# Patient Record
Sex: Female | Born: 1935 | Race: White | Hispanic: No | Smoking: Never smoker
Health system: Southern US, Community
[De-identification: ages and names within clinical notes are randomized; demographics above are authoritative.]

## PROBLEM LIST (undated history)

## (undated) DIAGNOSIS — J449 Chronic obstructive pulmonary disease, unspecified: Secondary | ICD-10-CM

## (undated) DIAGNOSIS — E7800 Pure hypercholesterolemia, unspecified: Secondary | ICD-10-CM

## (undated) DIAGNOSIS — I509 Heart failure, unspecified: Secondary | ICD-10-CM

## (undated) DIAGNOSIS — I10 Essential (primary) hypertension: Secondary | ICD-10-CM

## (undated) DIAGNOSIS — I2510 Atherosclerotic heart disease of native coronary artery without angina pectoris: Secondary | ICD-10-CM

## (undated) DIAGNOSIS — M1990 Unspecified osteoarthritis, unspecified site: Secondary | ICD-10-CM

---

## 1986-09-23 HISTORY — PX: DILATION AND CURETTAGE OF UTERUS: SHX78

## 1990-09-23 HISTORY — PX: SIGMOIDOSCOPY: SUR1295

## 1999-09-24 HISTORY — PX: SHOULDER SURGERY: SHX246

## 2004-09-23 HISTORY — PX: AORTIC VALVE REPLACEMENT: SHX41

## 2004-10-25 ENCOUNTER — Ambulatory Visit: Payer: Self-pay | Admitting: Cardiology

## 2004-11-09 DIAGNOSIS — Z952 Presence of prosthetic heart valve: Secondary | ICD-10-CM | POA: Insufficient documentation

## 2005-09-06 ENCOUNTER — Ambulatory Visit: Payer: Self-pay | Admitting: Family Medicine

## 2005-10-01 ENCOUNTER — Ambulatory Visit: Payer: Self-pay | Admitting: Ophthalmology

## 2005-10-14 ENCOUNTER — Ambulatory Visit: Payer: Self-pay | Admitting: Ophthalmology

## 2006-01-28 ENCOUNTER — Ambulatory Visit: Payer: Self-pay | Admitting: Cardiology

## 2006-01-28 IMAGING — US US CAROTID DUPLEX BILAT
1 series · 17 of 24 positions shown · non-contrast
Comparison: none

REASON FOR EXAM: TIAs
COMMENTS:

[Series 1: us carotid duplex bilat · 17 of 59 slices shown]
[im 1/59]
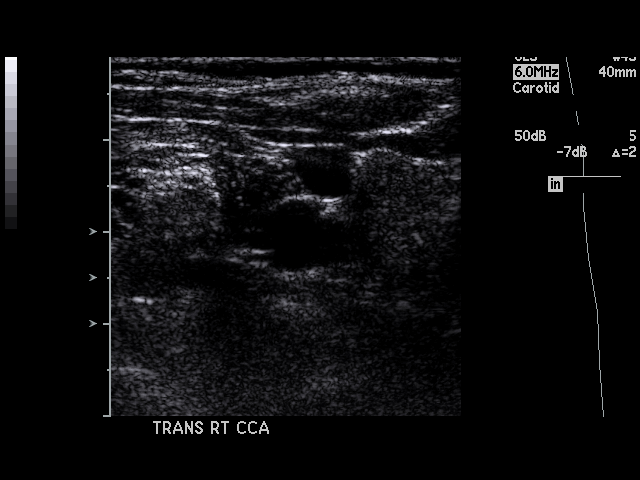
[im 6/59]
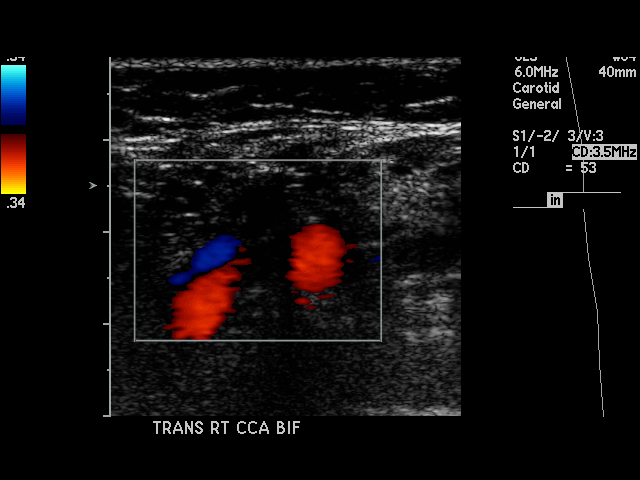
[im 8/59]
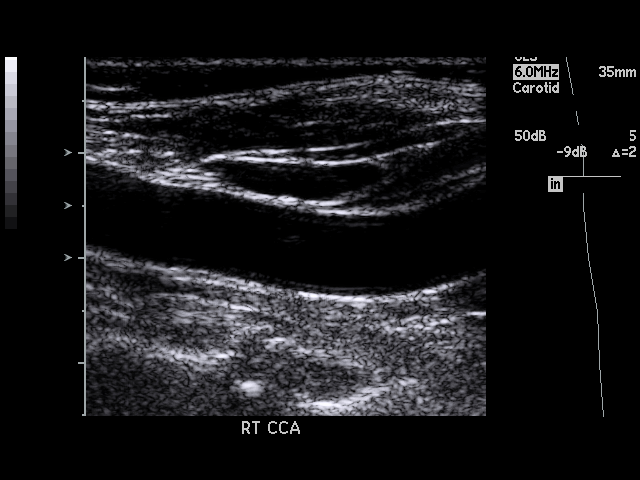
[im 11/59]
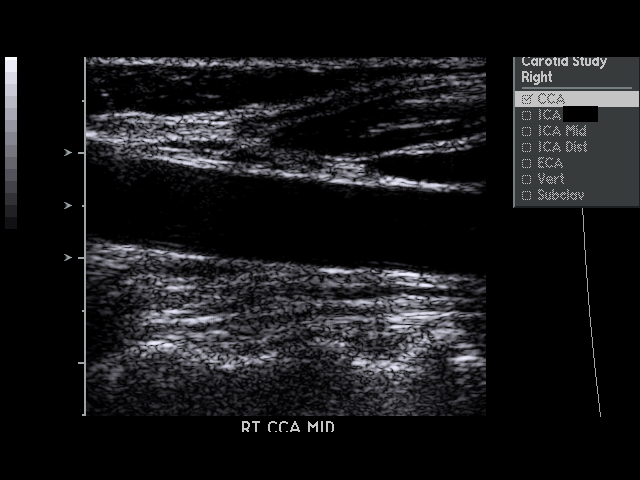
[im 16/59]
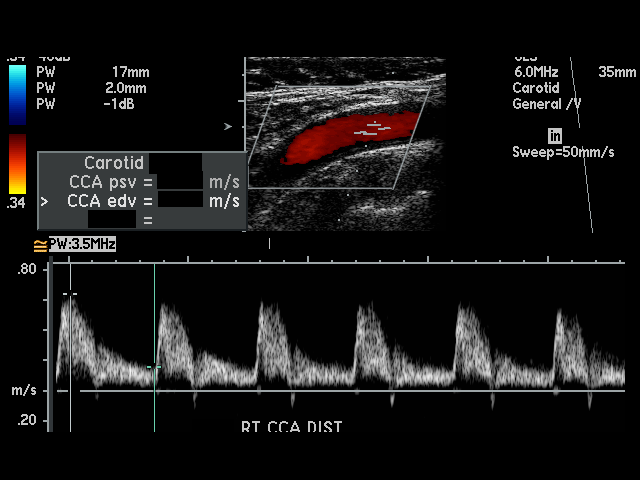
[im 18/59]
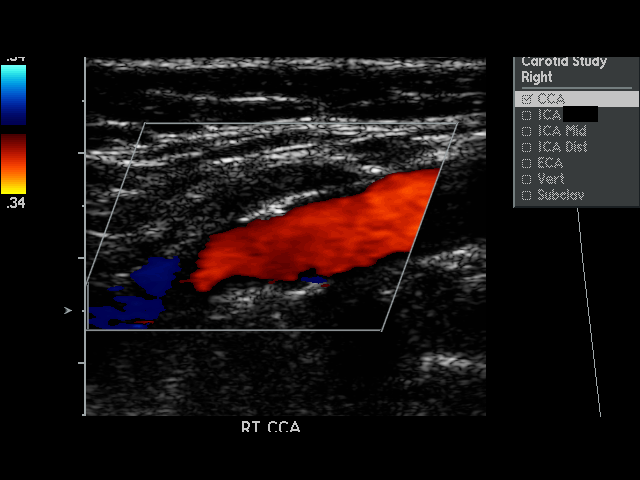
[im 23/59]
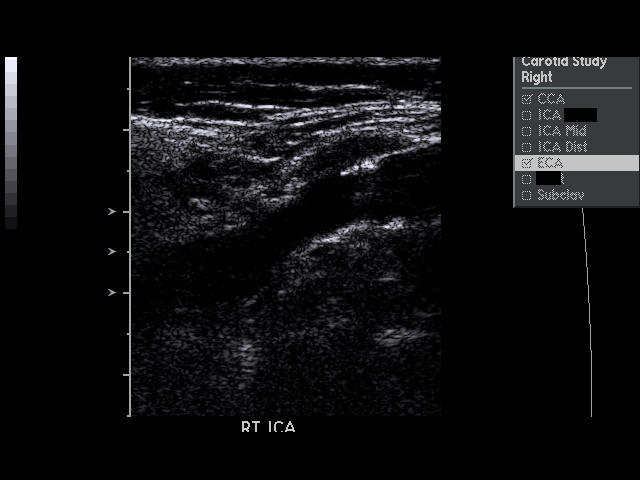
[im 26/59]
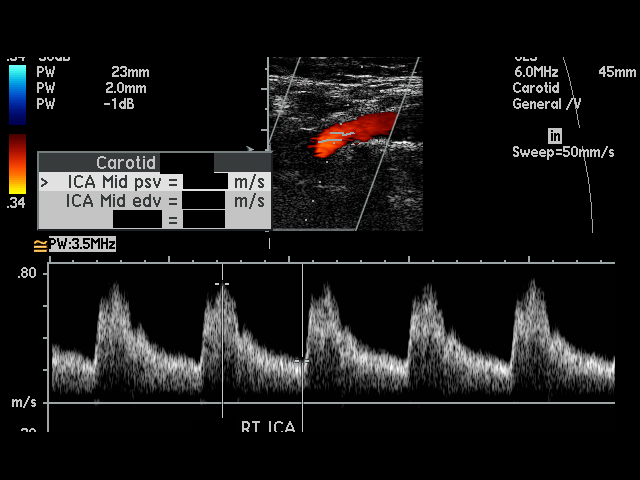
[im 31/59]
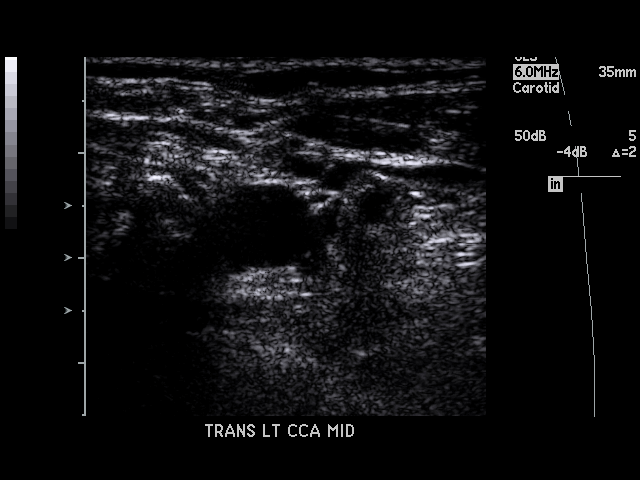
[im 33/59]
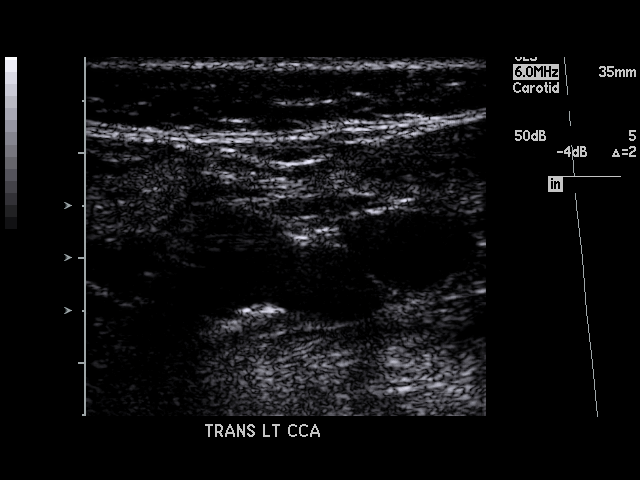
[im 36/59]
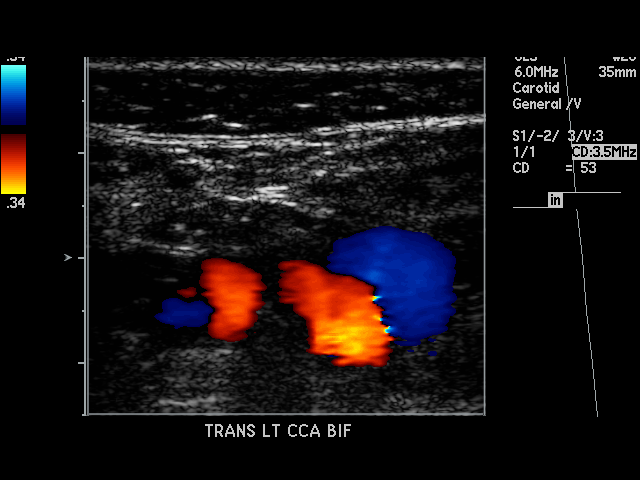
[im 41/59]
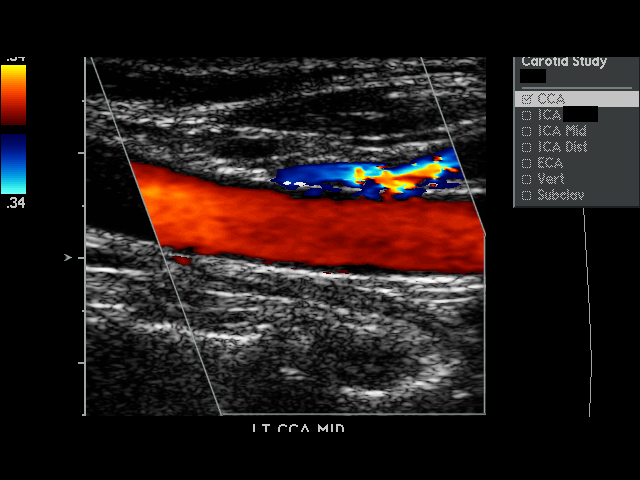
[im 43/59]
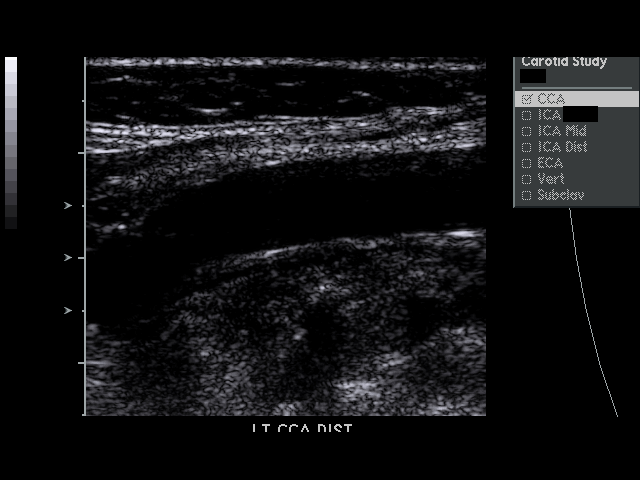
[im 48/59]
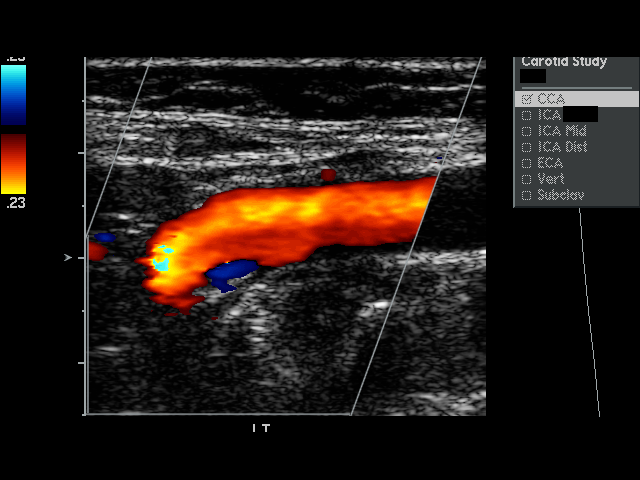
[im 51/59]
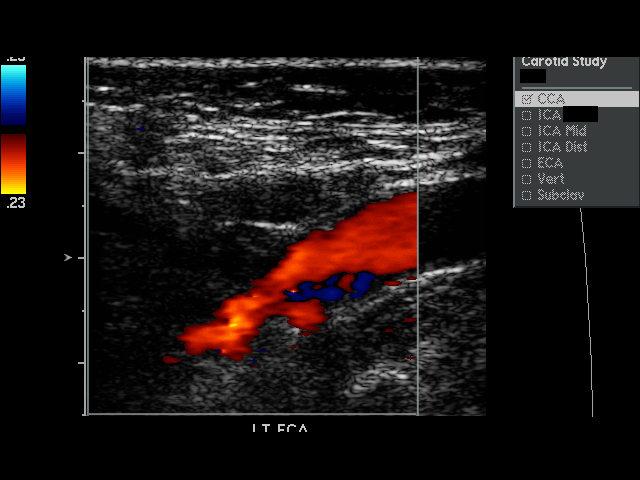
[im 53/59]
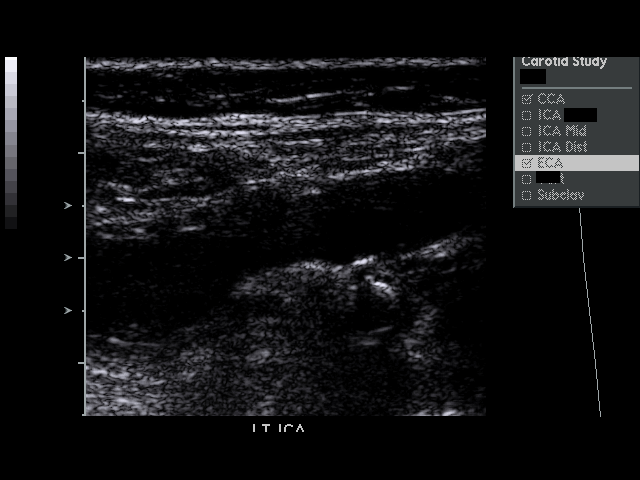
[im 59/59]
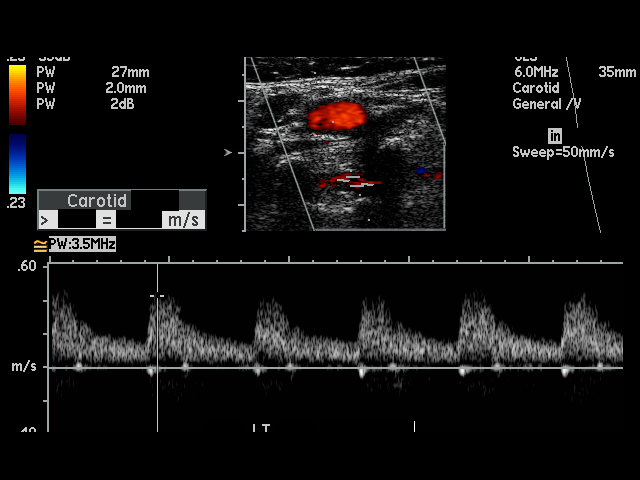

[17 of 24 positions shown; findings below may reference images not displayed]

PROCEDURE:     US  - US CAROTID DOPPLER BILATERAL  - [DATE] [DATE]

RESULT:     There is mild calcific plaque formation seen about the carotid
bifurcations bilaterally. On the RIGHT the peak RIGHT common carotid artery
flow velocity measures .717 meters per second and the peak RIGHT internal
carotid artery flow velocity measures .887 meters per second. The IC/CC
ratio is 1.237.  On the LEFT the peak LEFT common carotid artery flow
velocity measures .846 meters per second and the peak LEFT internal carotid
artery flow velocity measures .773 meters per second. The IC/CC ratio is
0.923.  These values bilaterally are compatible with the absence of
hemodynamically significant stenosis.

There is antegrade flow is both vertebrals.
IMPRESSION: 1)Mild plaque formation is seen bilaterally.

2)No hemodynamically significant stenosis is observed on either side.

3)Antegrade flow is present in both vertebrals.

## 2006-12-08 LAB — HM PAP SMEAR: HM Pap smear: NEGATIVE

## 2007-01-15 ENCOUNTER — Ambulatory Visit: Payer: Self-pay | Admitting: Family Medicine

## 2007-01-19 ENCOUNTER — Ambulatory Visit: Payer: Self-pay | Admitting: Family Medicine

## 2007-01-19 IMAGING — RF DG UGI W/O KUB
1 series · 15 of 24 positions shown · non-contrast
Comparison: none

REASON FOR EXAM: reflux
COMMENTS:

[Series 1: run · 24 acquisitions, 15 frames shown]
[im 1/24]
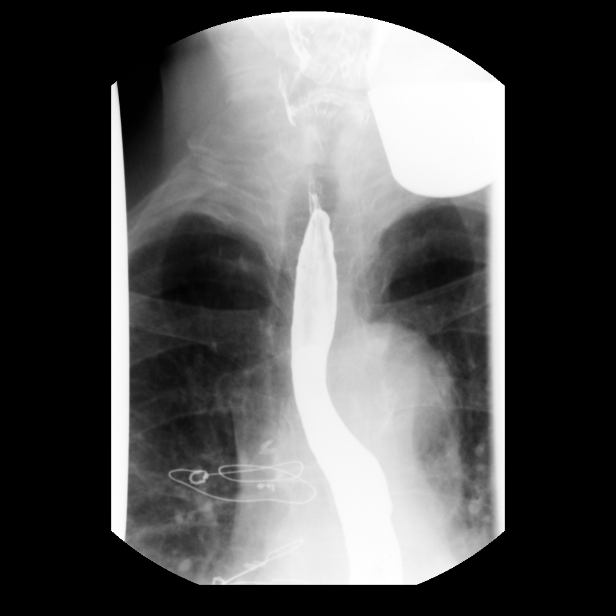
[im 3/24]
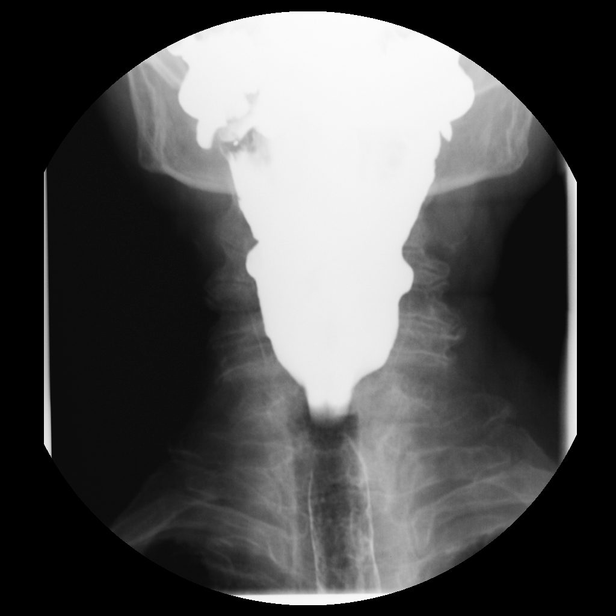
[im 3/24]
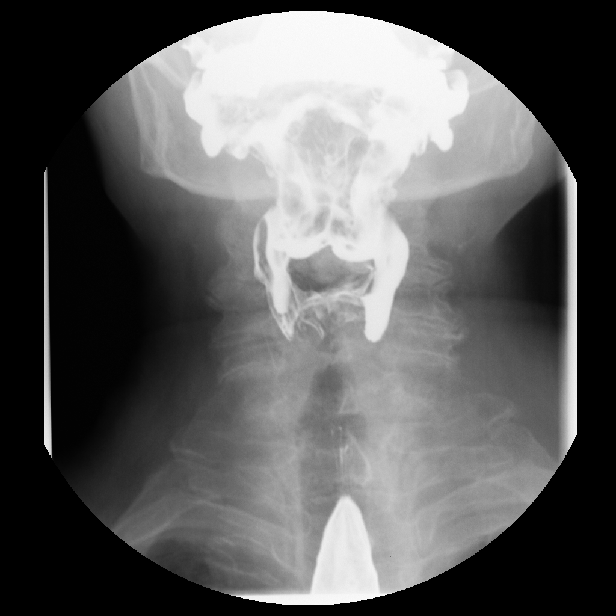
[im 4/24]
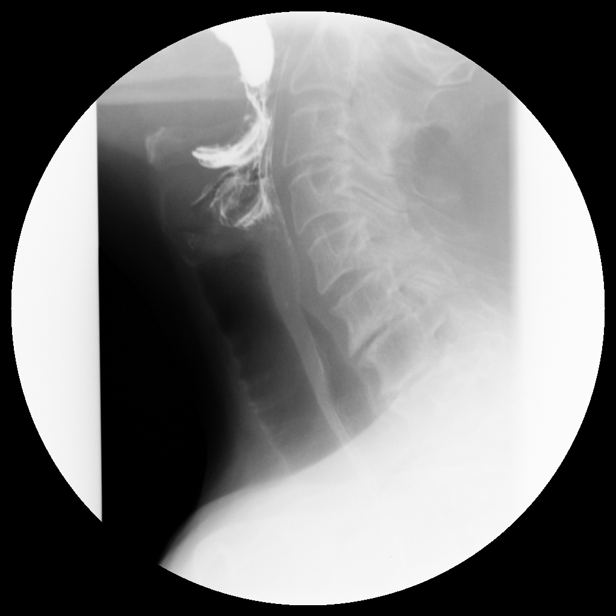
[im 4/24]
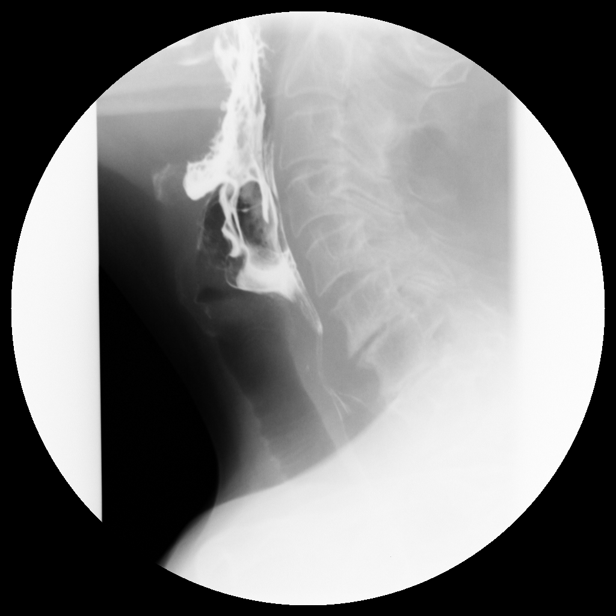
[im 5/24]
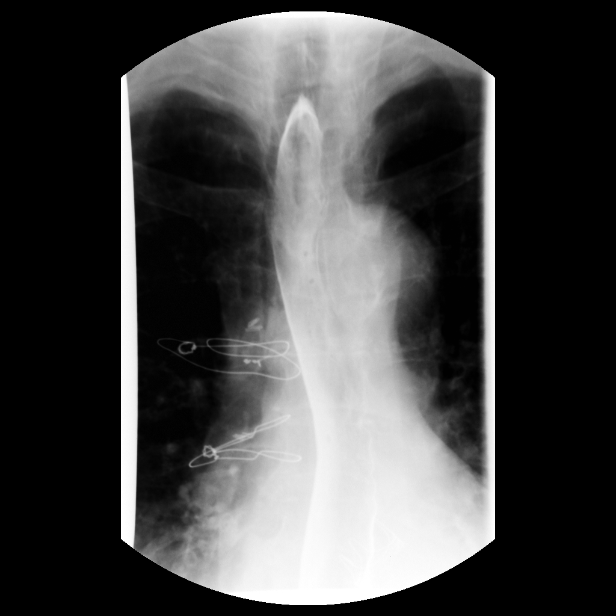
[im 8/24]
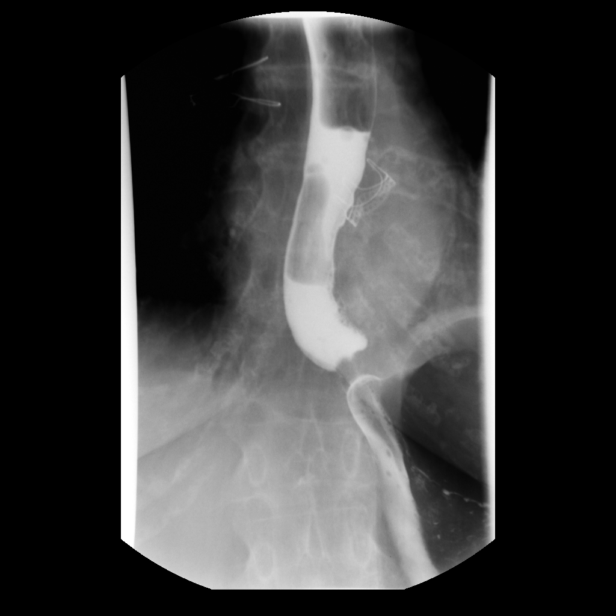
[im 10/24]
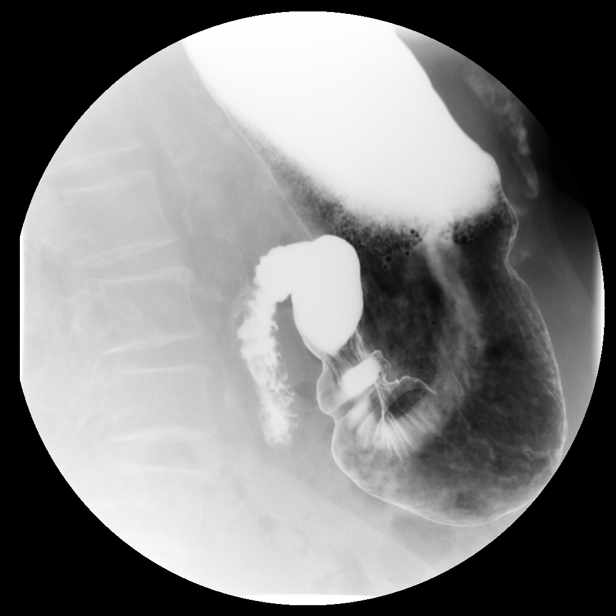
[im 11/24]
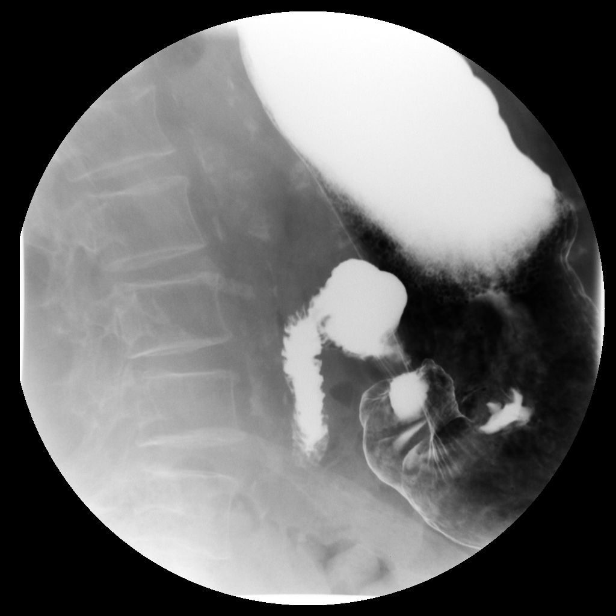
[im 14/24]
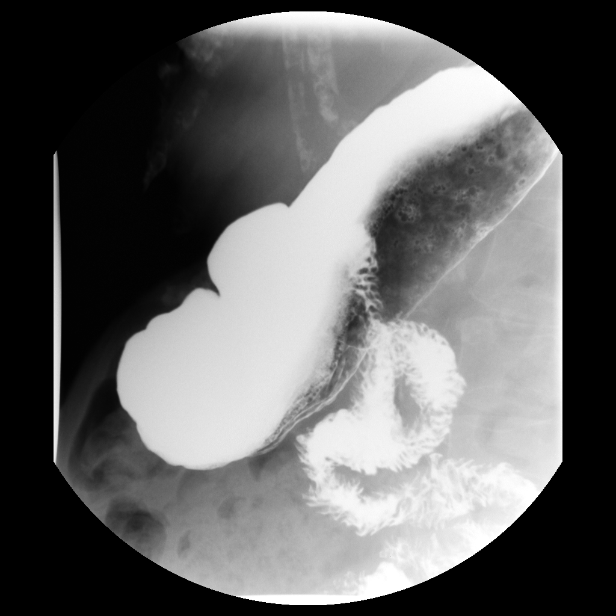
[im 15/24]
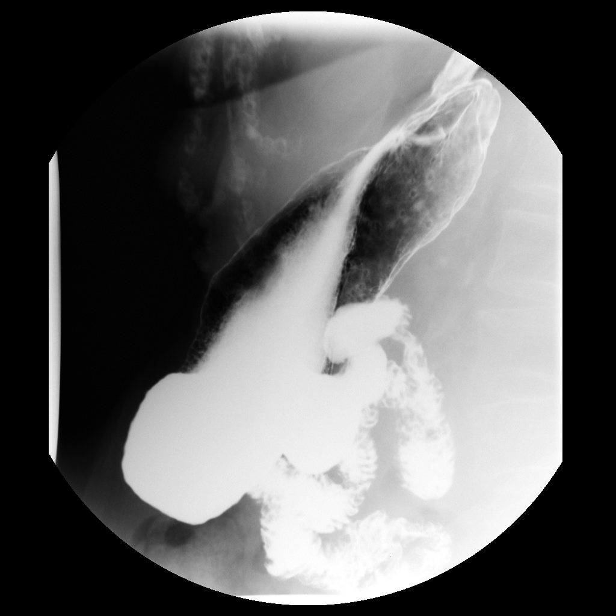
[im 18/24]
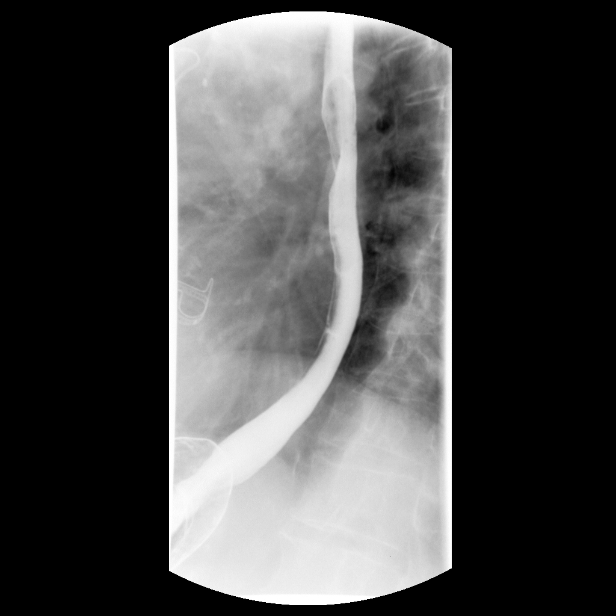
[im 20/24]
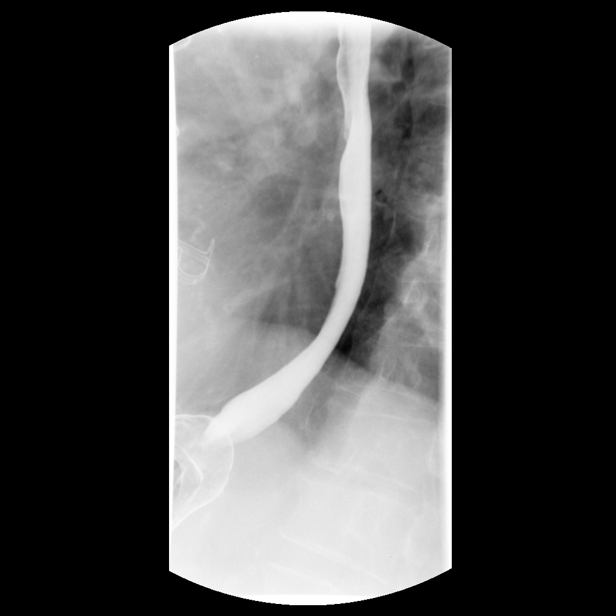
[im 21/24]
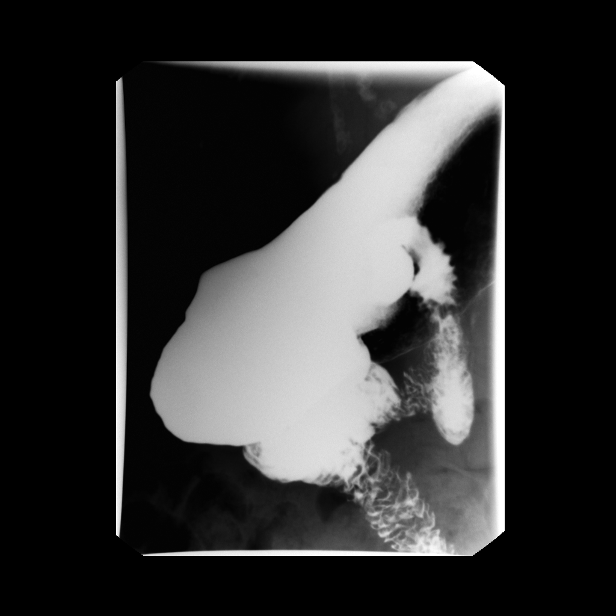
[im 24/24]
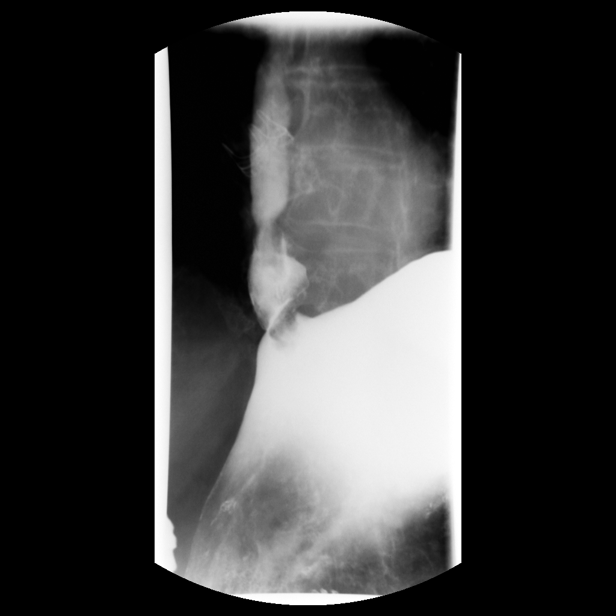

[15 of 24 positions shown; findings below may reference images not displayed]

PROCEDURE:     FL  - FL UPPER GI  - [DATE]  [DATE]

RESULT:     The cervical esophagus was first examined.  Barium passed
through the cervical esophagus unobstructed. No areas of narrowing are
identified. The esophagus reveals normal peristaltic activity. No hiatal
hernia is identified. There is noted a small amount of gastroesophageal
reflux present. The 12.5 mm barium impregnated tablet passed into the
stomach unobstructed. The stomach appears normal throughout its length with
no ulcer craters visualized. The duodenal bulb as well as the duodenal
C-loop appears within normal limits.
IMPRESSION: 1)No hiatal hernia; however, a small amount of gastroesophageal reflux is
noted. No ulcer craters are seen.

## 2007-12-28 ENCOUNTER — Ambulatory Visit: Payer: Self-pay | Admitting: Family Medicine

## 2007-12-28 IMAGING — CR DG CHEST 2V
1 series · 2 of 2 positions shown · non-contrast
Comparison: none

REASON FOR EXAM: Acute bronchitis
COMMENTS:

[Series 1: view not recorded · 0.17mm/px · 2 of 2 slices shown]
[im 1/2]
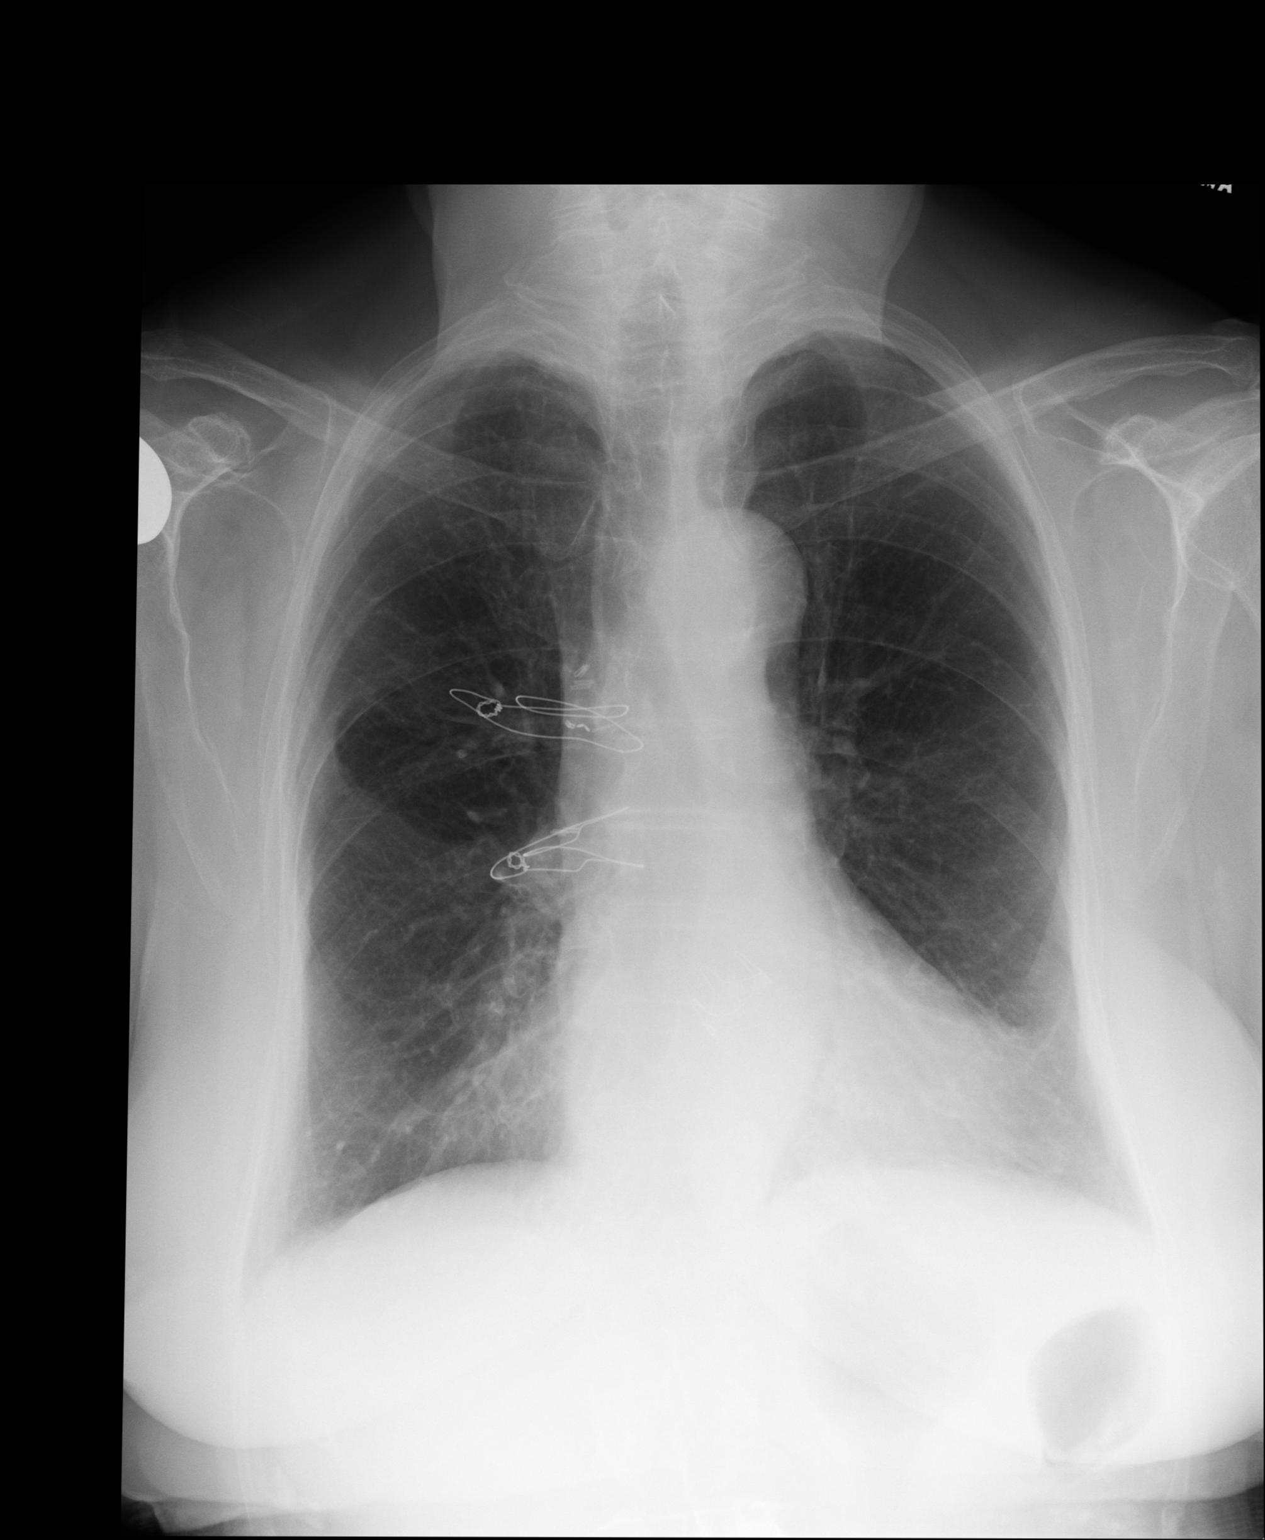
[im 2/2]
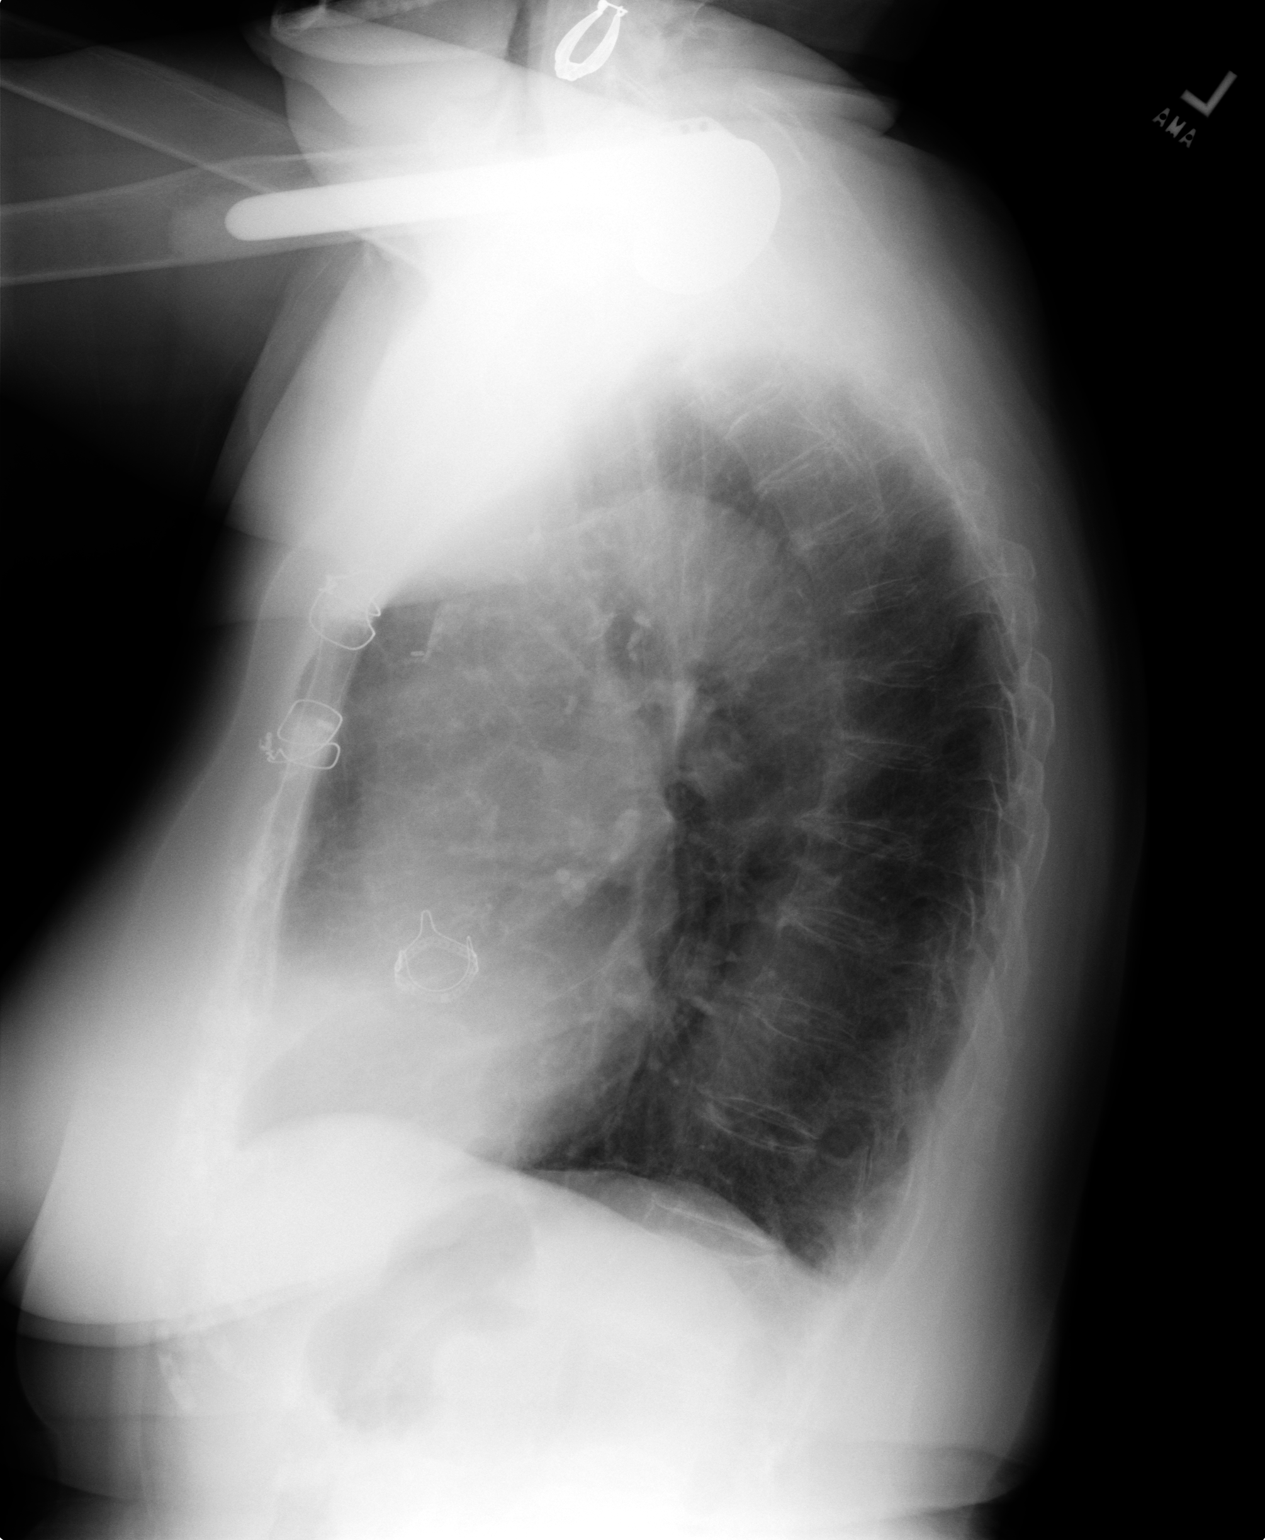

[2 of 2 positions shown; findings below may reference images not displayed]

PROCEDURE:     KDR - KDXR CHEST PA (OR AP) AND LAT  - [DATE] [DATE]

RESULT:     There is no previous exam available for comparison.

Sternotomy wires are present. The lungs are hyperinflated consistent with
COPD. The bones are osteopenic. Degenerative spurring is present. A
replacement valve is present within the heart. This may represent an aortic
valve replacement. There is no infiltrate, edema, effusion or pneumothorax.
IMPRESSION: 1.  Prior valve replacement.
2.  COPD.
3.  Osteopenia with degenerative changes of the spine. Evidence of a
previous RIGHT shoulder arthroplasty is noted.

## 2008-01-15 ENCOUNTER — Ambulatory Visit: Payer: Self-pay | Admitting: Family Medicine

## 2008-01-15 IMAGING — CR DG CHEST 2V
1 series · 2 of 2 positions shown · non-contrast
Comparison: none

REASON FOR EXAM: sprain of ribs; pain under right ribs, breast, shoulder
COMMENTS:

[Series 1: view not recorded · 0.17mm/px · 2 of 2 slices shown]
[im 1/2]
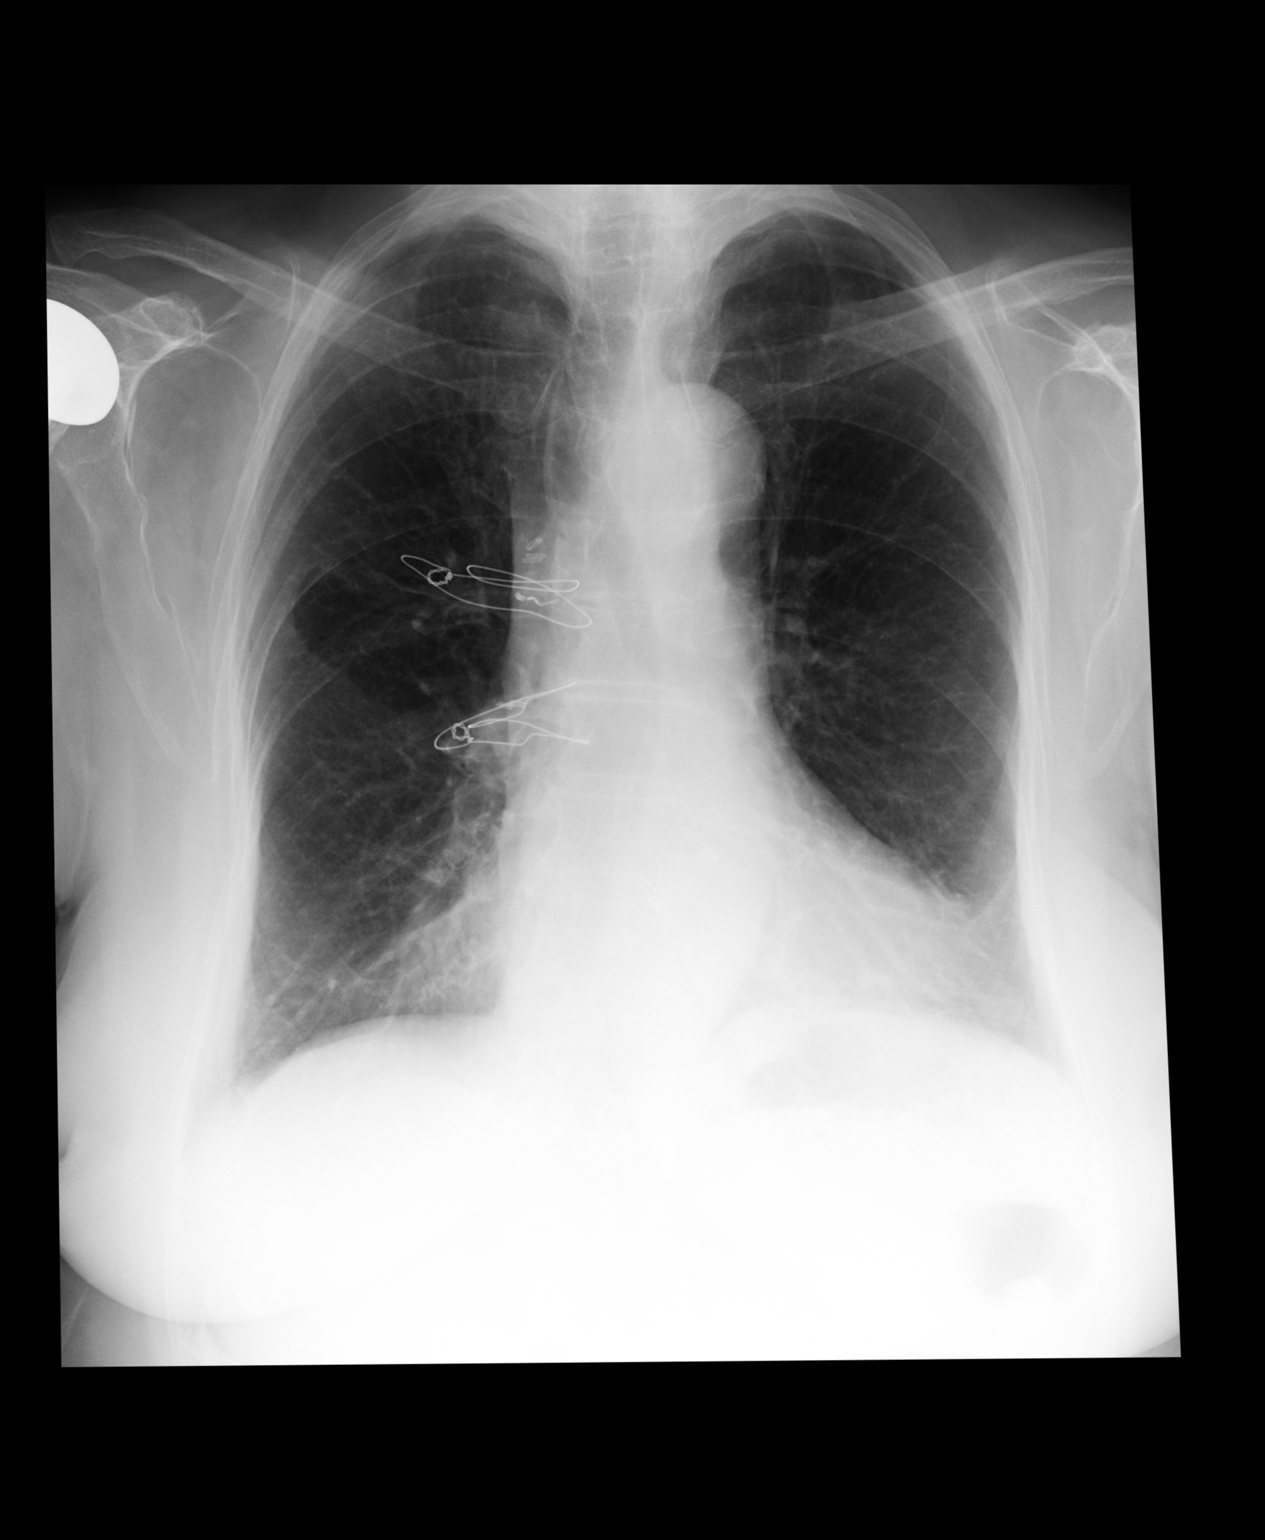
[im 2/2]
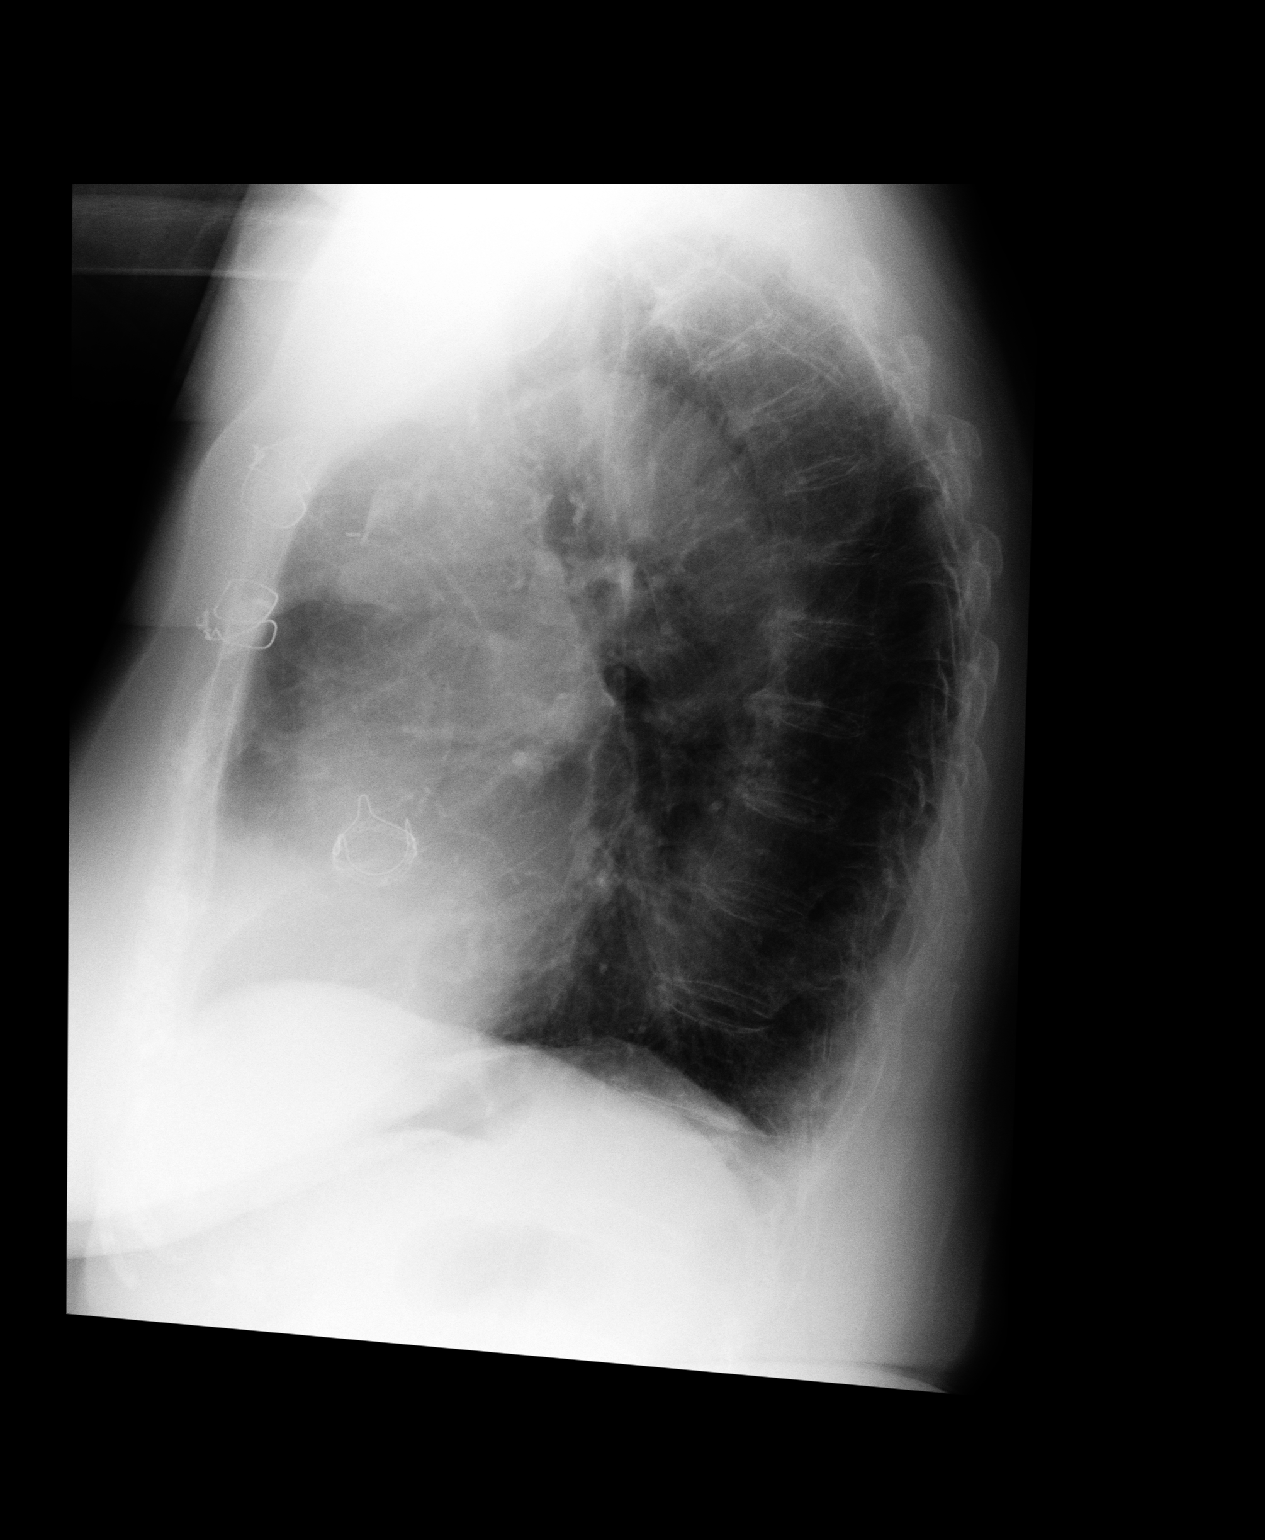

[2 of 2 positions shown; findings below may reference images not displayed]

PROCEDURE:     KDR - KDXR CHEST PA (OR AP) AND LAT  - [DATE]  [DATE]

RESULT:     Comparison is made to the prior exam of [DATE]. There are mild
fibrotic changes at the lung bases. The chest is hyperexpanded compatible
with COPD. The heart is mildly enlarged. The patient is status post aortic
valve replacement. No interstitial or pulmonary edema is seen. No displaced
rib fractures are seen. A nondisplaced or minimally displaced fracture could
still be present and not detected. No pneumothorax is seen.
IMPRESSION: 1. No acute changes are identified as compared to the prior exam of
[DATE].
[DATE]. The lung fields are clear of infiltrate.
3. Fibrotic changes are noted at the lung bases.
4. No pneumothorax is seen.
5. No definite rib fracture is identified.

## 2008-05-23 ENCOUNTER — Ambulatory Visit: Payer: Self-pay | Admitting: Family Medicine

## 2008-05-23 IMAGING — US ULTRASOUND LEFT BREAST
1 series · 8 of 8 positions shown · non-contrast
Comparison: none

REASON FOR EXAM: Left breast pain, yearly also
COMMENTS:

[Series 1: ultrasound left breast · 8 of 8 slices shown]
[im 1/8]
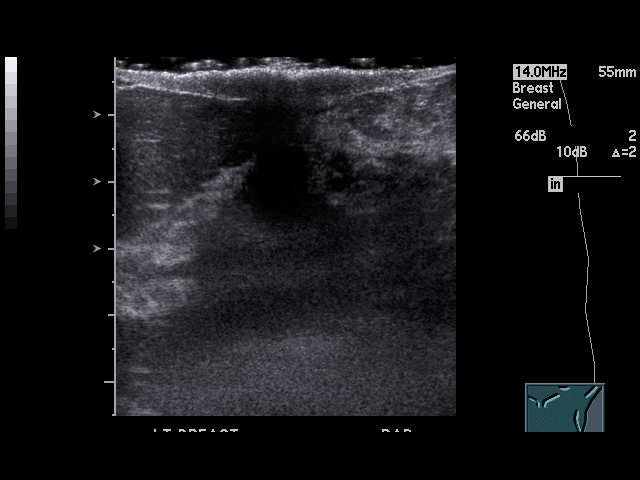
[im 2/8]
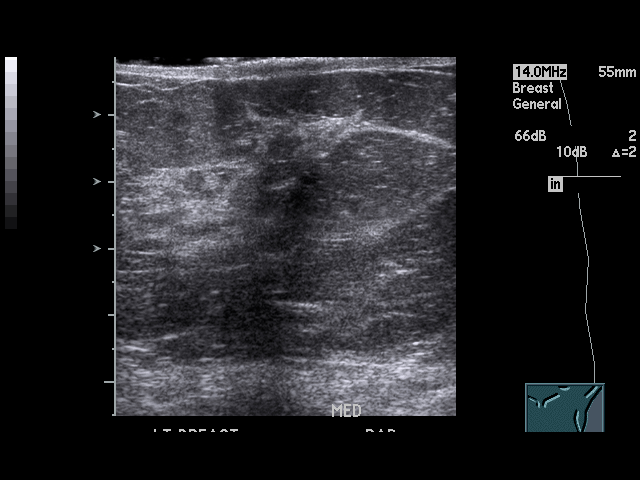
[im 3/8]
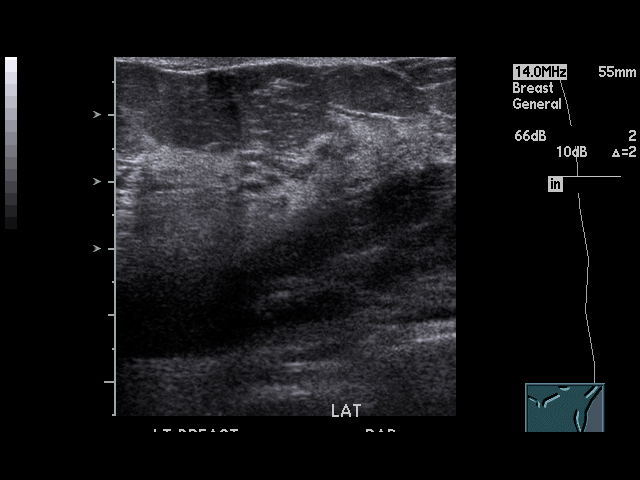
[im 4/8]
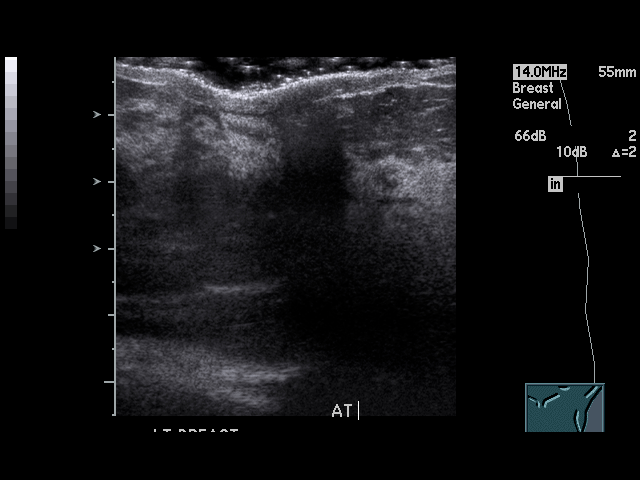
[im 5/8]
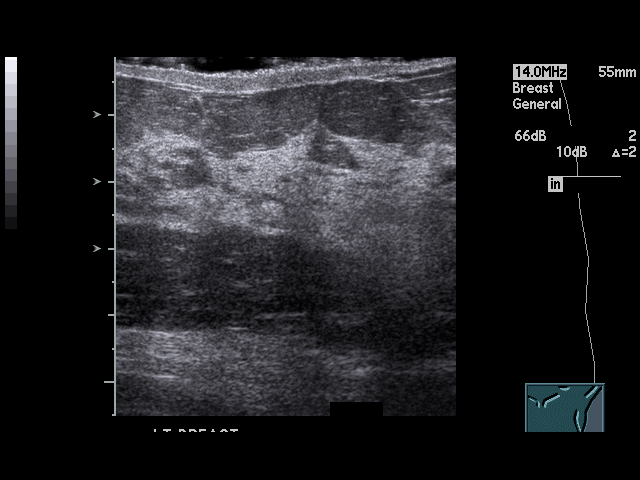
[im 6/8]
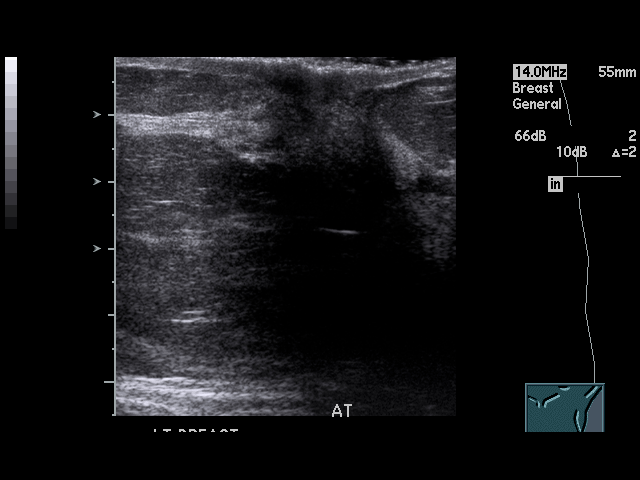
[im 7/8]
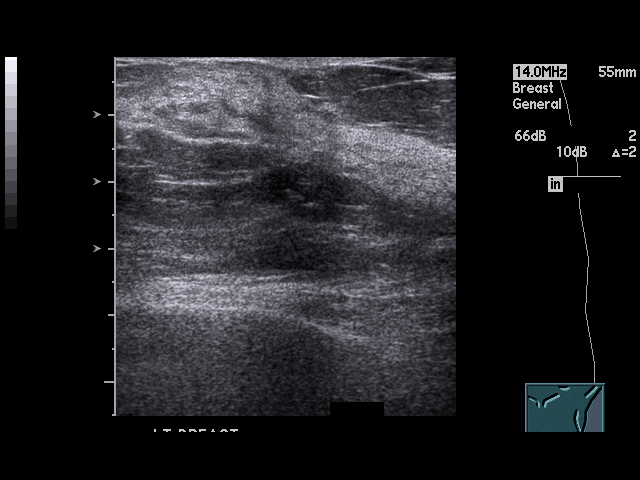
[im 8/8]
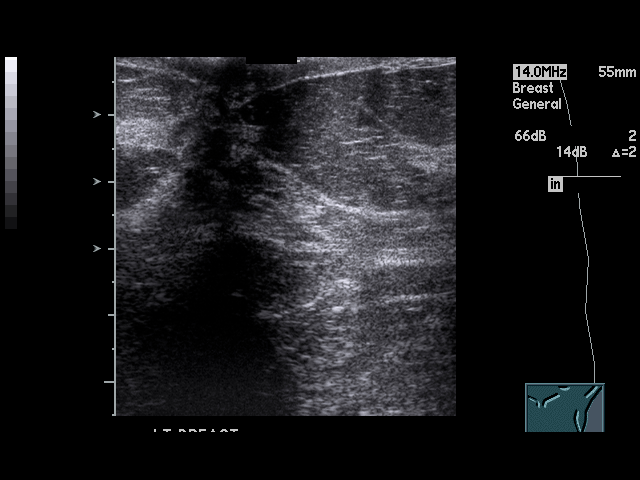

[8 of 8 positions shown; findings below may reference images not displayed]

PROCEDURE:     US  - US BREAST LEFT  - [DATE] [DATE]

RESULT:     The patient is undergoing evaluation for LEFT breast discomfort.

The periareolar region of the LEFT breast was evaluated which reportedly is
the area of discomfort. No discrete cystic or solid masses were identified.
The echotexture of the imaged parenchyma was normal.
IMPRESSION: I do not see abnormality in the periareolar region at
ultrasound. Please see the dictation of the diagnostic mammogram of this day
for final recommendations and BI-RADS classification.

## 2009-08-08 ENCOUNTER — Ambulatory Visit: Payer: Self-pay | Admitting: Family Medicine

## 2009-08-08 LAB — HM MAMMOGRAPHY

## 2009-11-07 IMAGING — CR DG HIP COMPLETE 2+V*L*
1 series · 2 of 2 positions shown · non-contrast
Comparison: none

REASON FOR EXAM: back pain
COMMENTS:

PROCEDURE:     KDR - KDXR HIP LEFT COMPLETE  - [DATE] [DATE]
RESULT:     No fracture, dislocation or other acute bony abnormality is
identified. The hip joint space is well-maintained.

[Series 2: view not recorded · 0.17mm/px · 2 of 2 slices shown]
[im 1/2]
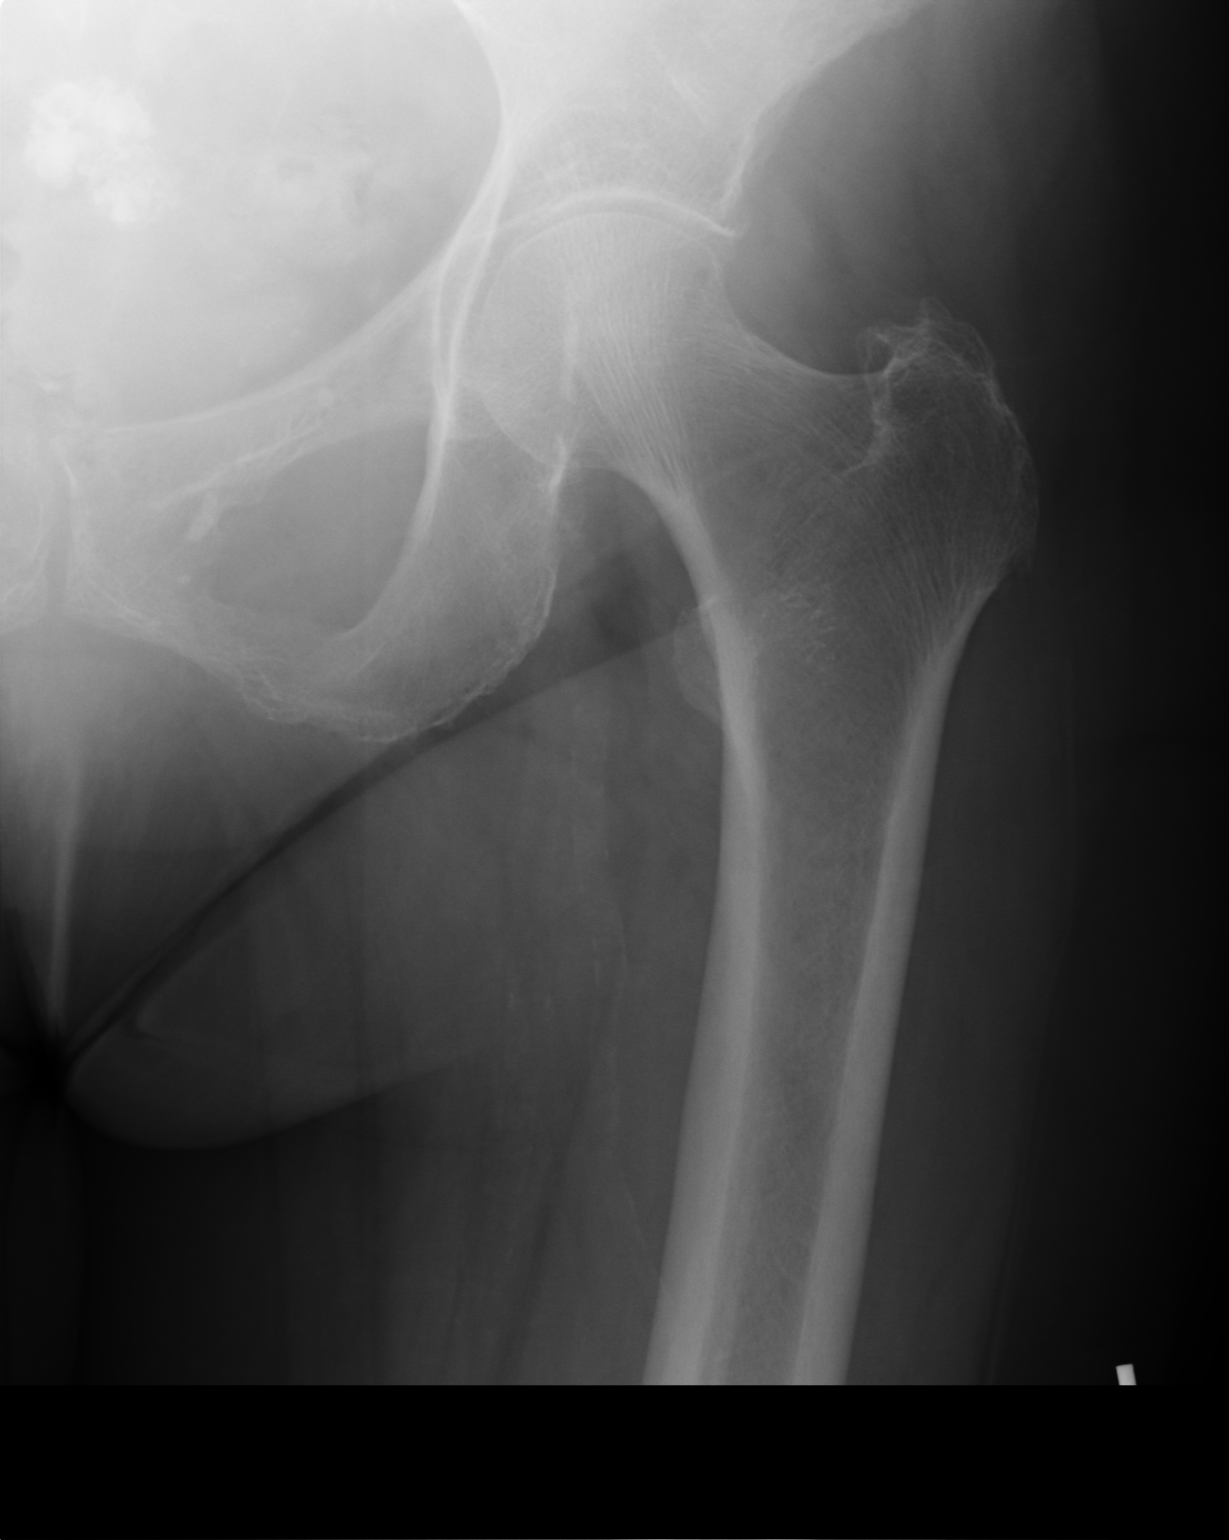
[im 2/2]
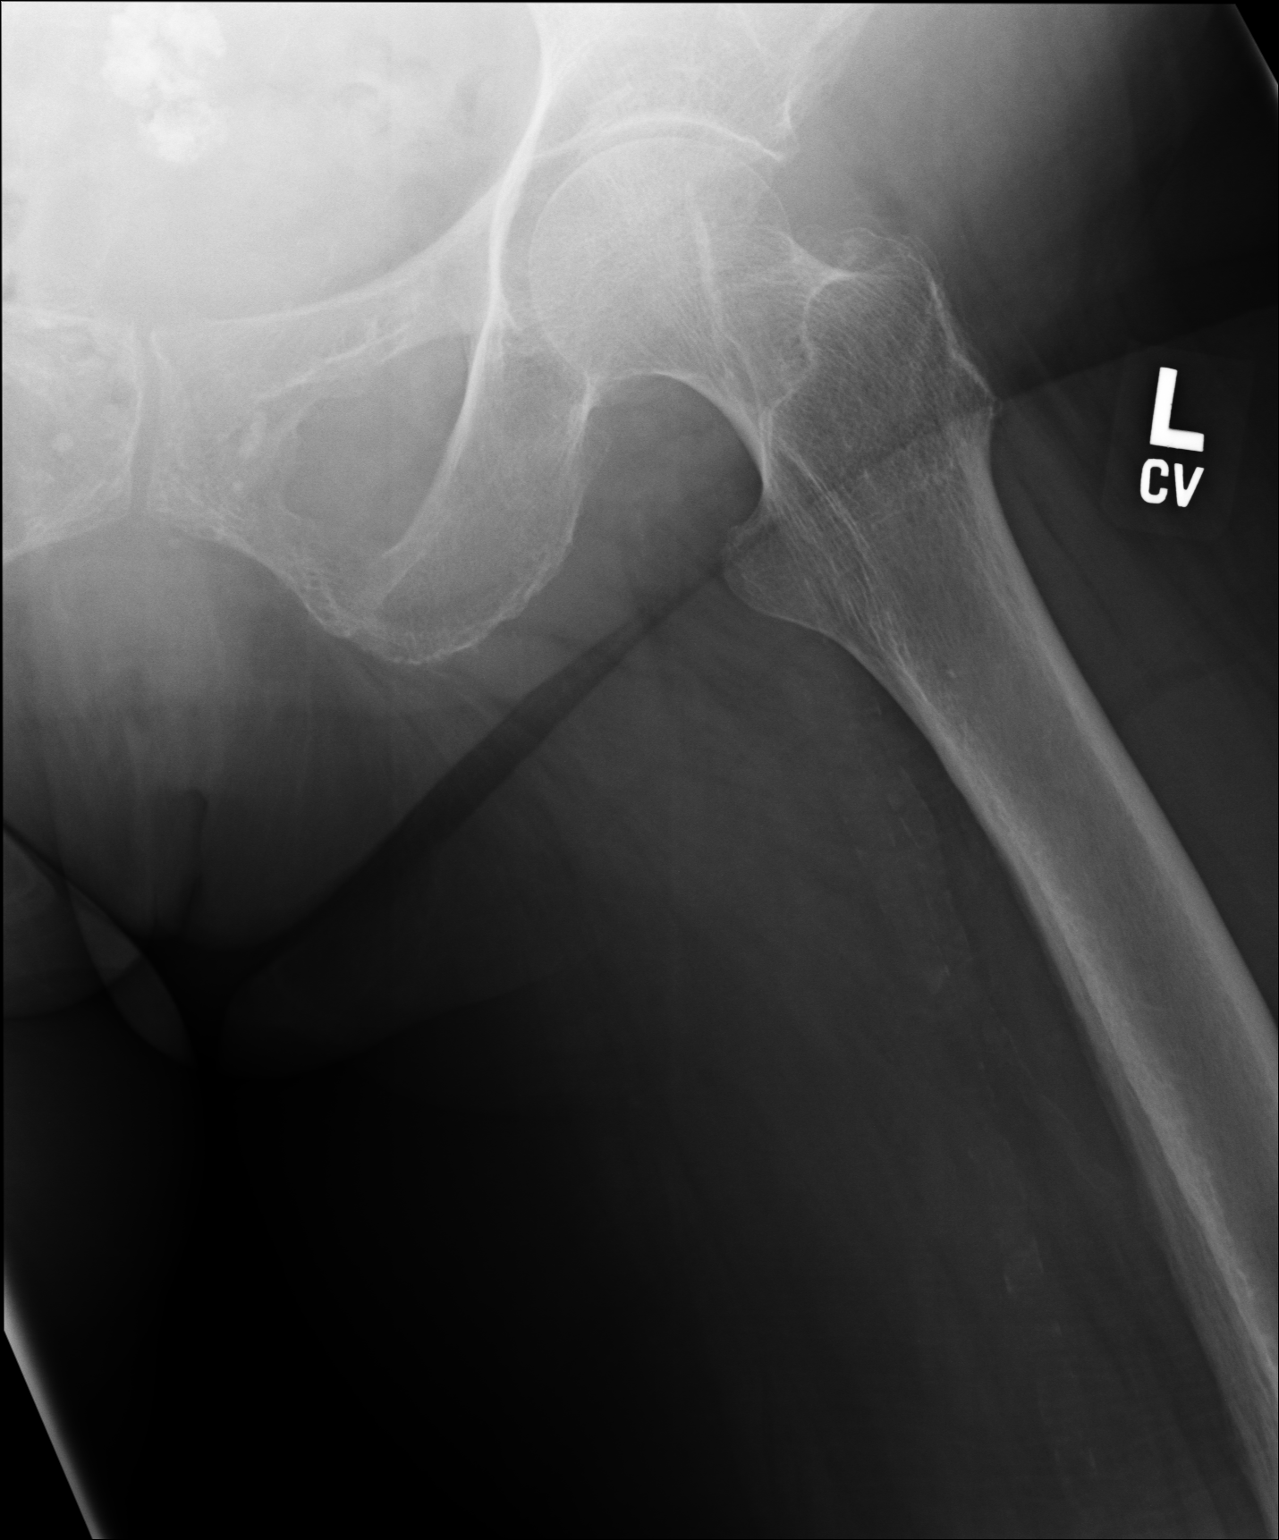

[2 of 2 positions shown; findings below may reference images not displayed]

IMPRESSION: 1. No significant abnormalities are noted.
2. No lytic or blastic changes are identified.

## 2010-06-14 ENCOUNTER — Ambulatory Visit: Payer: Self-pay | Admitting: Family Medicine

## 2010-06-14 IMAGING — CR DG LUMBAR SPINE 2-3V
1 series · 3 of 3 positions shown · non-contrast
Comparison: none

REASON FOR EXAM: back pain
COMMENTS:

[Series 2: view not recorded · 0.17mm/px · 3 of 3 slices shown]
[im 1/3]
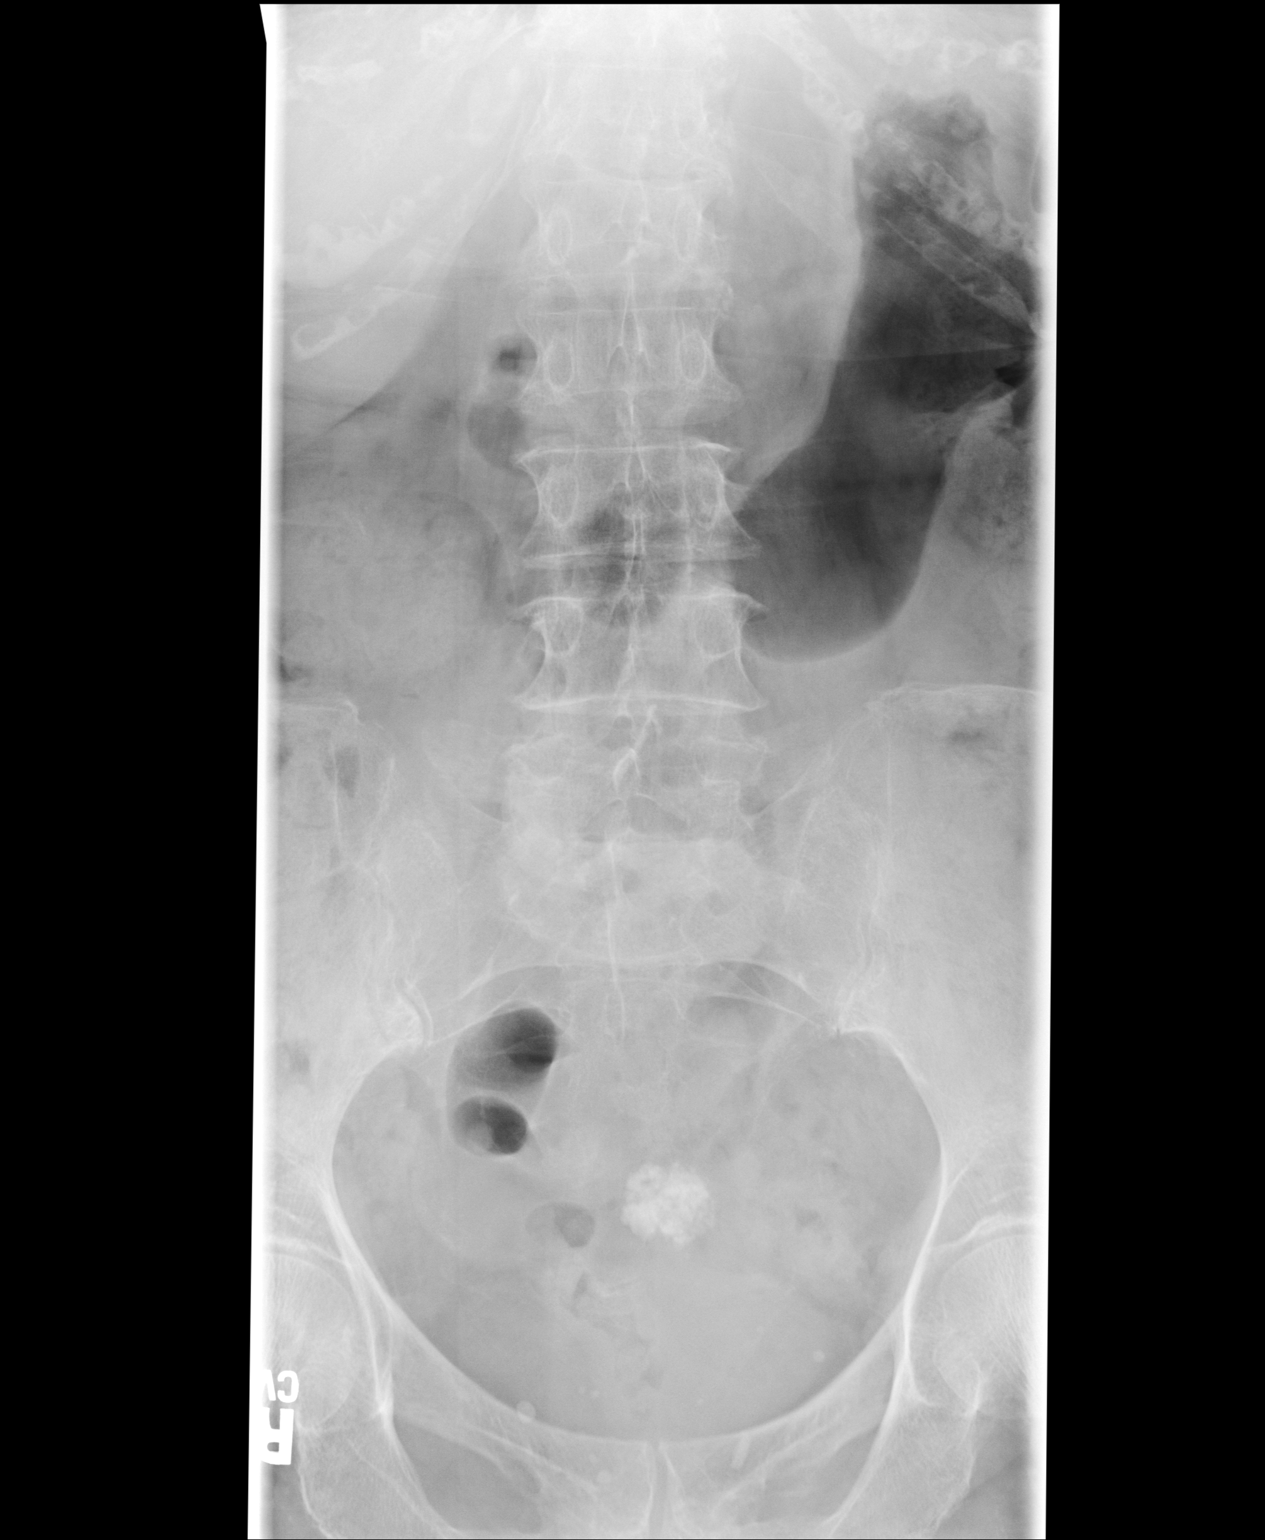
[im 2/3]
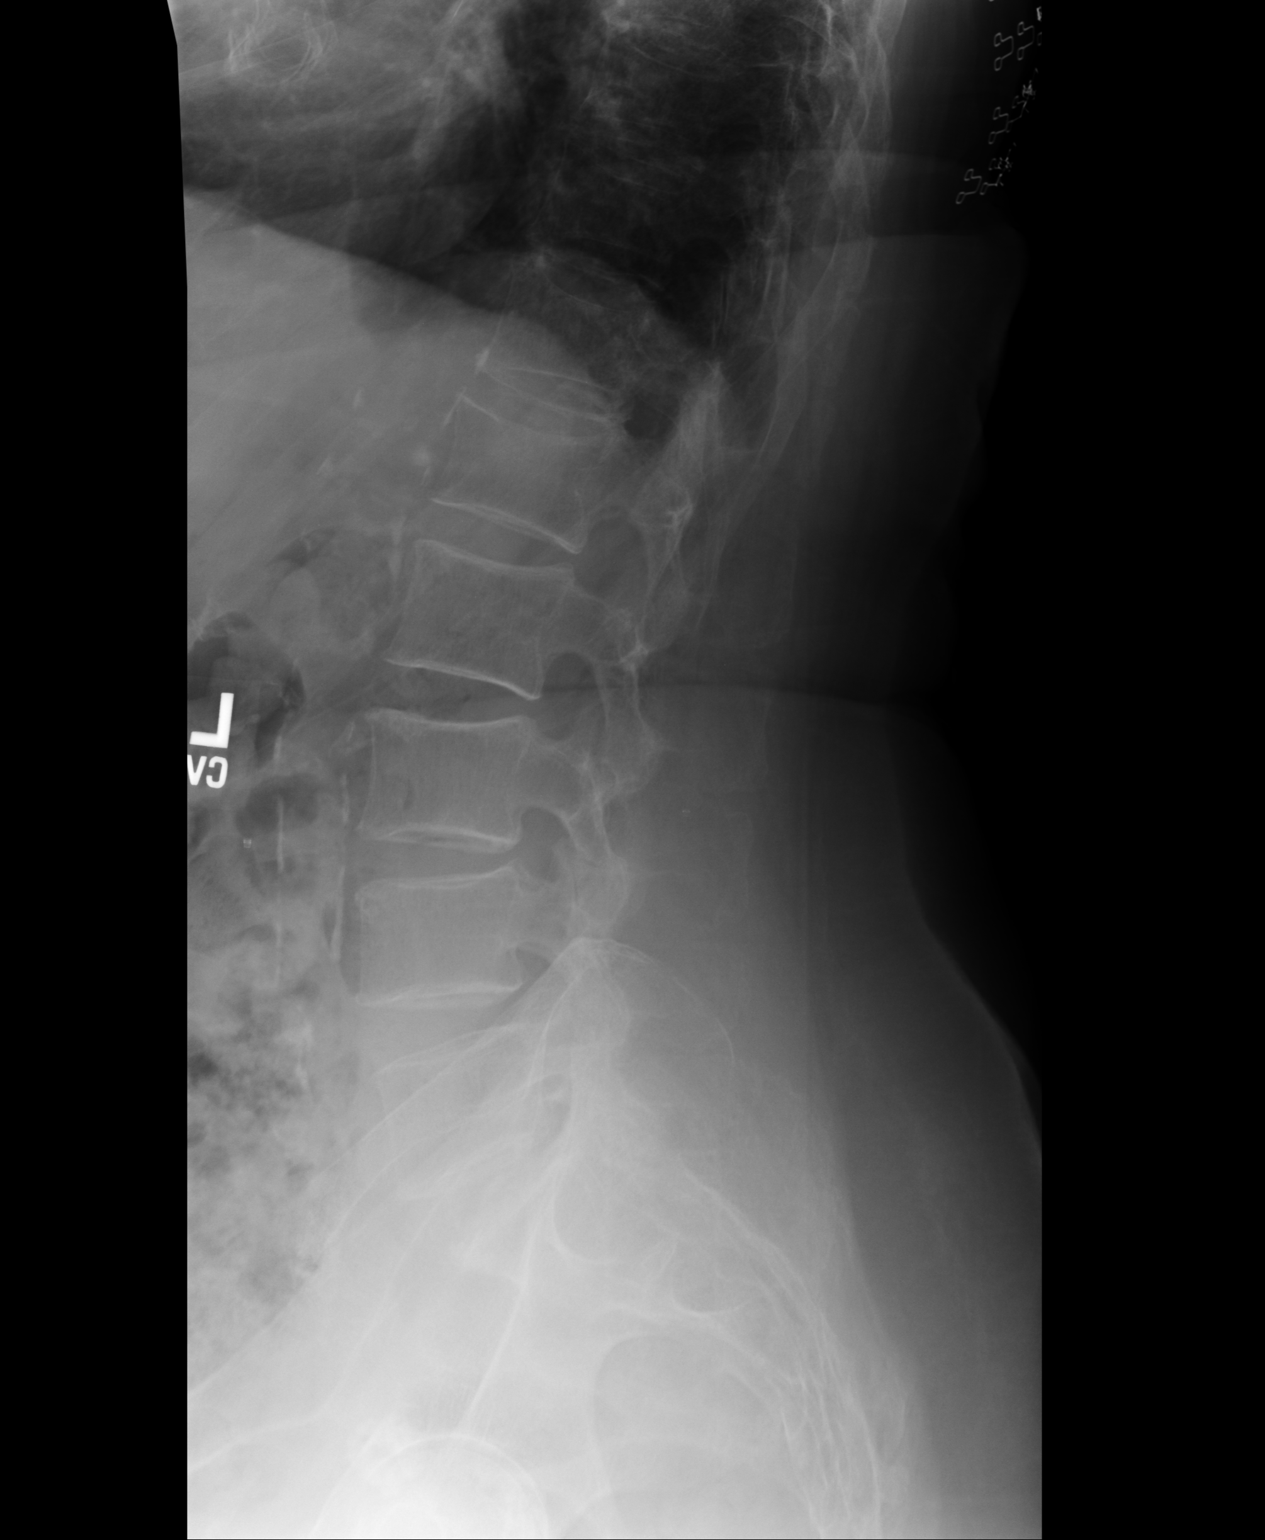
[im 3/3]
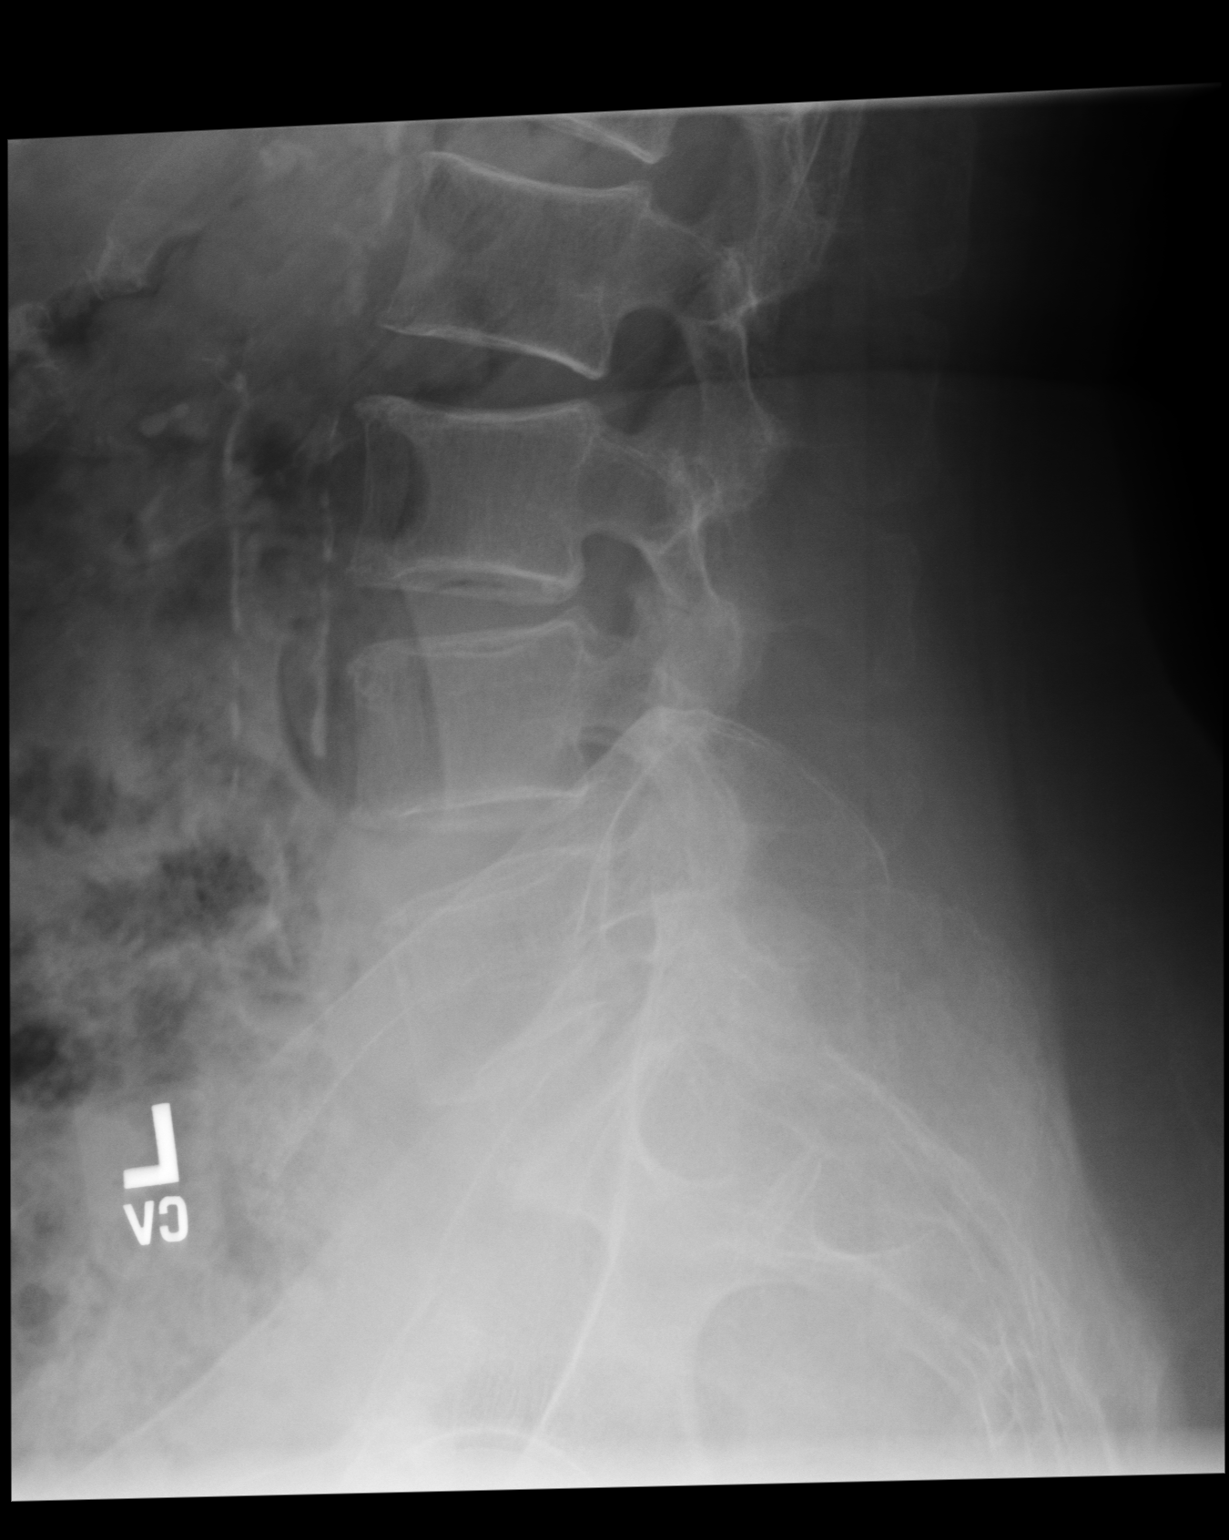

[3 of 3 positions shown; findings below may reference images not displayed]

PROCEDURE:     KDR - KDXR LUMBAR SPINE AP AND LATERAL  - [DATE] [DATE]

RESULT:     The vertebral body heights and the intervertebral disc spaces
are well maintained. The vertebral body alignment is normal. The pedicles
are bilaterally intact. No lytic or blastic lesions are seen. In the AP
view, a calcified uterine fibroid is noted in the pelvic area.
IMPRESSION: 1. No acute bony abnormalities are identified.
2. No finding suspicious for metastatic disease is identified.
3. A calcified uterine fibroid is noted.
4. Incidental note is made of calcification in the abdominal aorta.

## 2010-06-14 IMAGING — CR SACRUM AND COCCYX - 2+ VIEW
2 series · 4 of 4 positions shown · non-contrast
Comparison: none

REASON FOR EXAM: back pain
COMMENTS:

[Series 2: view not recorded · 0.17mm/px · 2 of 2 slices shown (1 of 2)]
[im 1/2]
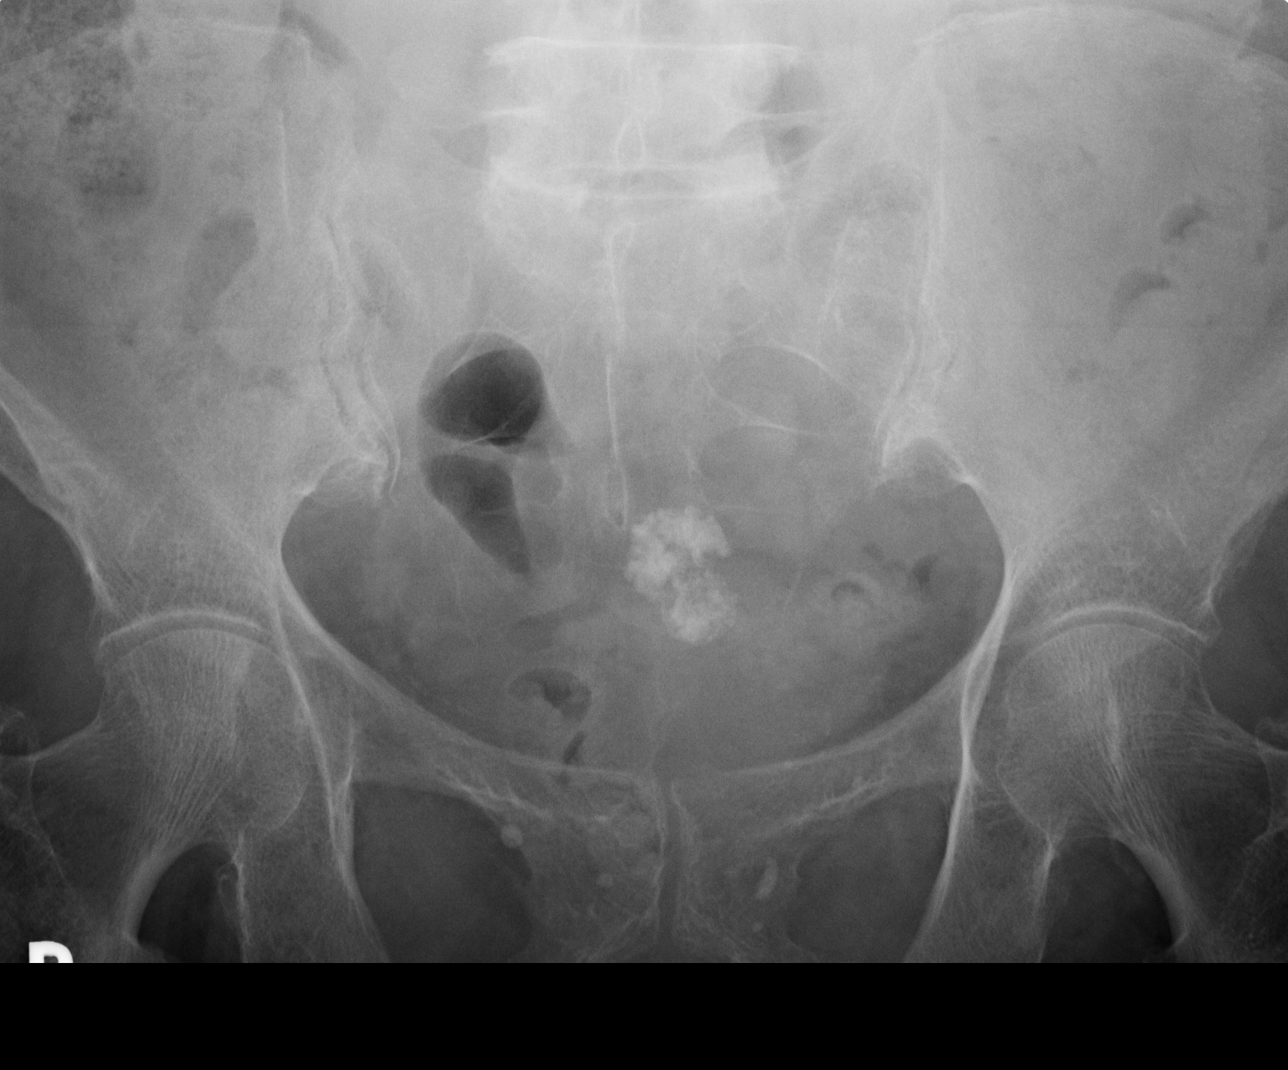
[im 2/2]
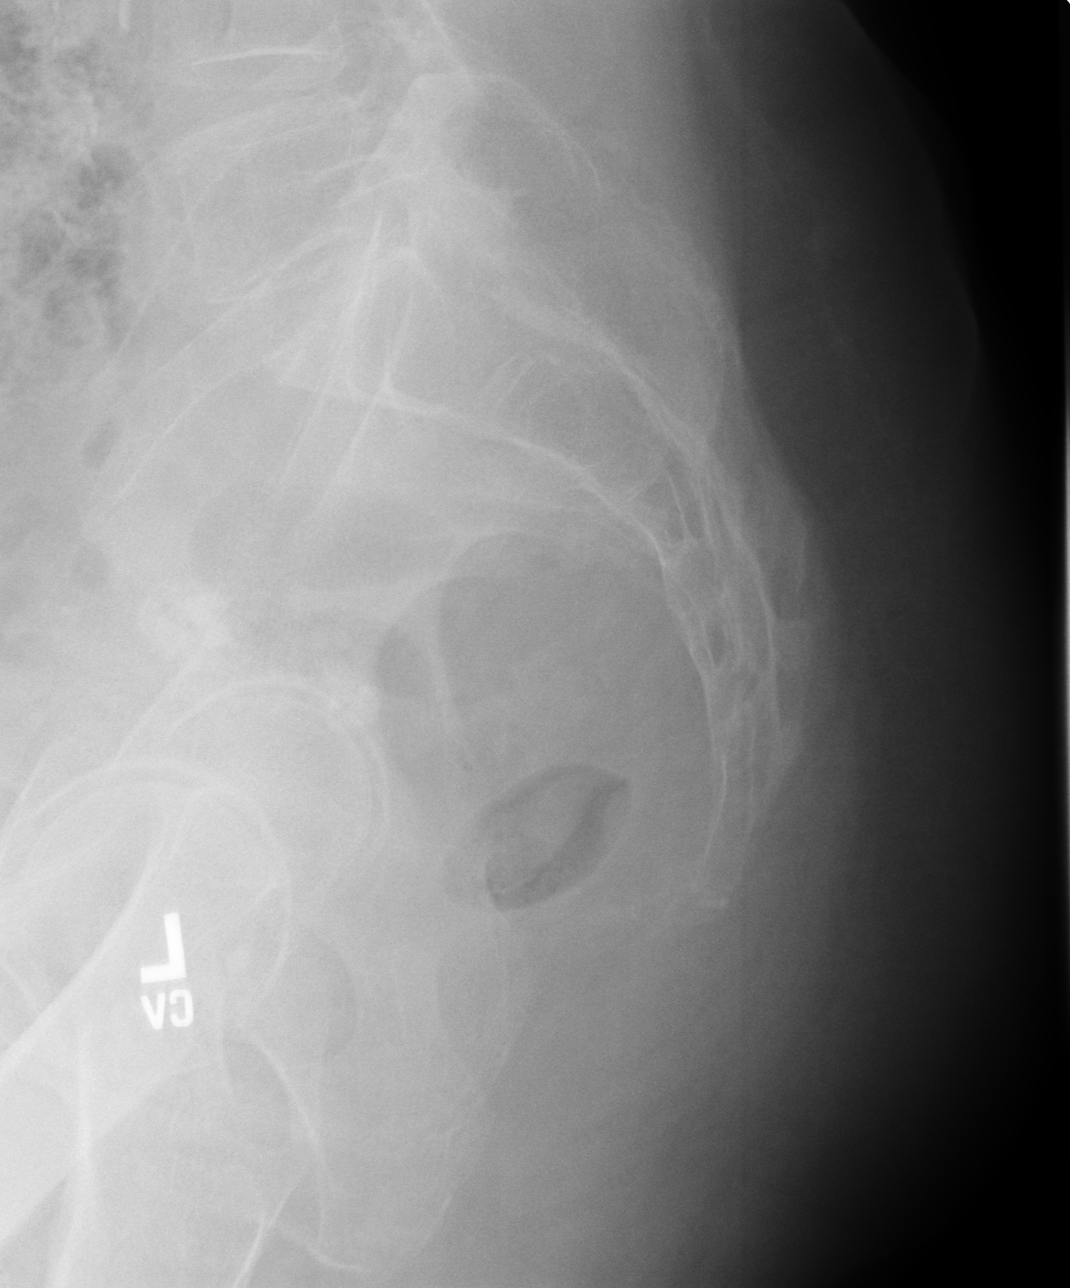

[Series 3: view not recorded · 0.17mm/px · 2 of 2 slices shown (2 of 2)]
[im 1/2]
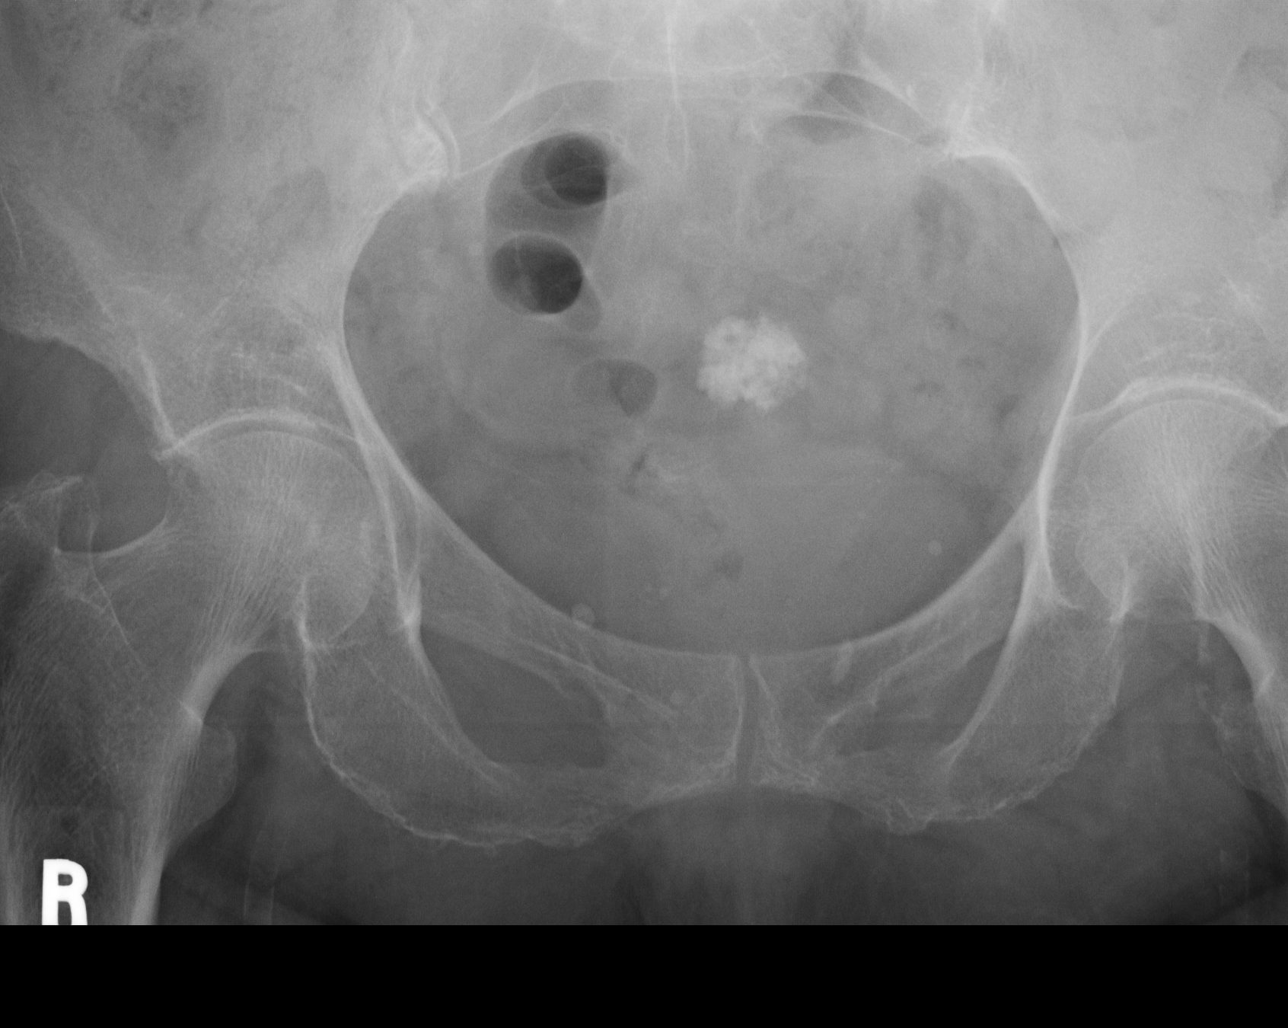
[im 2/2]
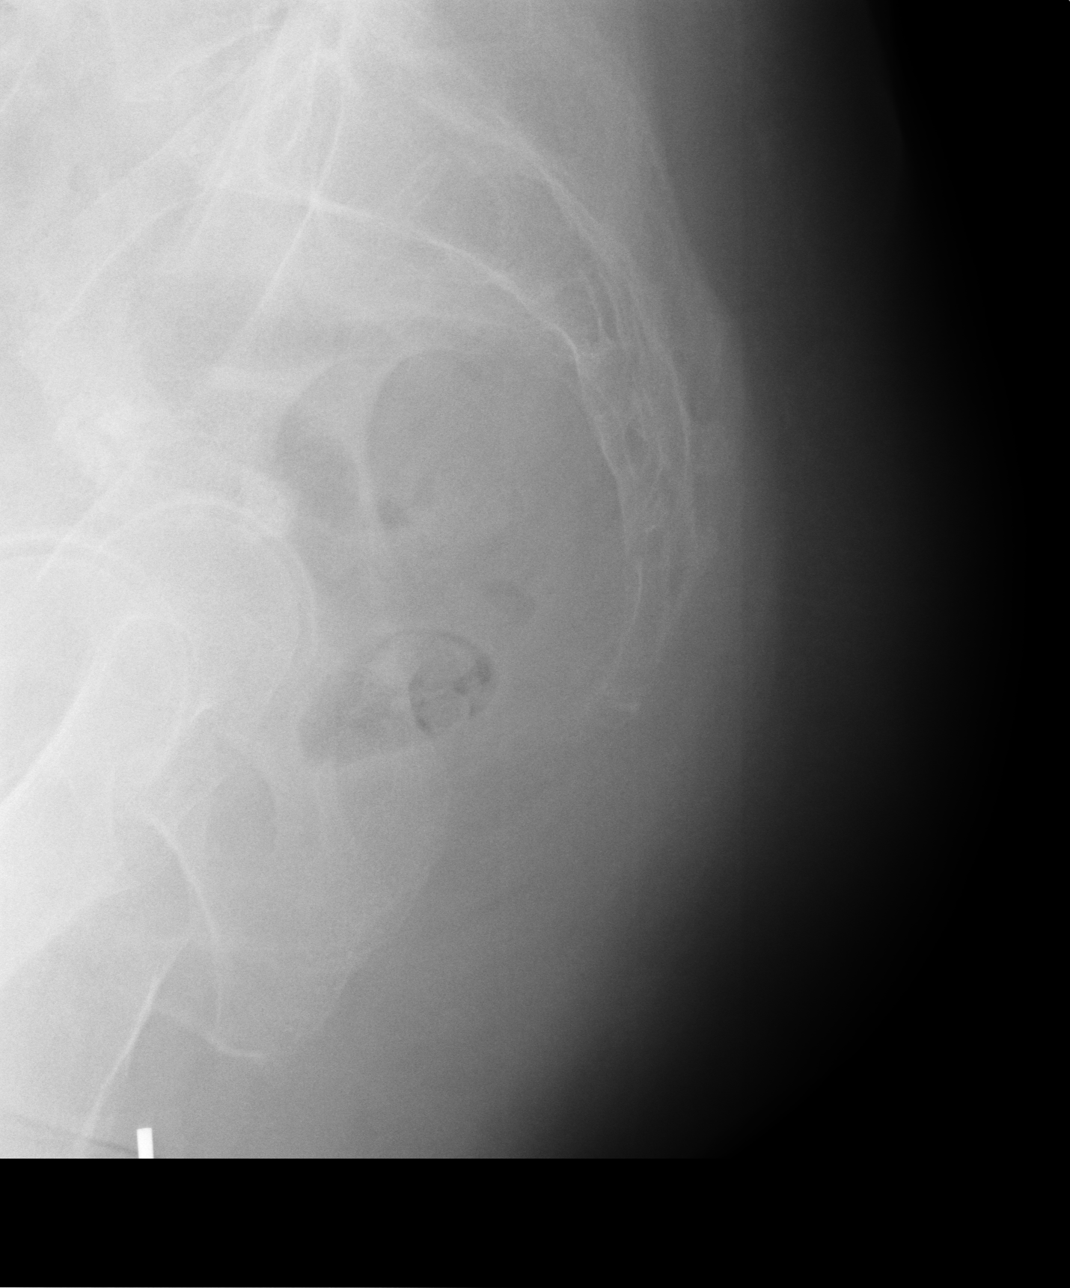

[4 of 4 positions shown; findings below may reference images not displayed]

PROCEDURE:     KDR - KDXR SACRUM AND COCCYX  - [DATE] [DATE]

RESULT:     No acute fracture is identified. A nondisplaced or minimally
displaced fracture might be difficult to visualize and if the patient has
had recent trauma and symptoms persist followup examination of the sacrum
and coccyx would be recommended. No findings suspicious for metastatic
disease are noted. Incidental note is made of calcification in the pelvic
area compatible with a calcification in a uterine fibroid.
IMPRESSION: 1. No definite bony abnormalities are identified.
2. Followup is recommended if symptomatology persists.
3. A calcified uterine fibroid is noted.

## 2010-06-27 ENCOUNTER — Ambulatory Visit: Payer: Self-pay | Admitting: Specialist

## 2010-06-27 IMAGING — CT CT CHEST W/O CM
2 series · 14 of 31 positions shown, 18 images · non-contrast
Comparison: none

REASON FOR EXAM: Rt Infilitrate Allergic to Iodine
COMMENTS:

[Series 2: soft tissue · axial · 0.59mm/px · 1 of 66 slices shown]
[im 6/66  mediastinal]
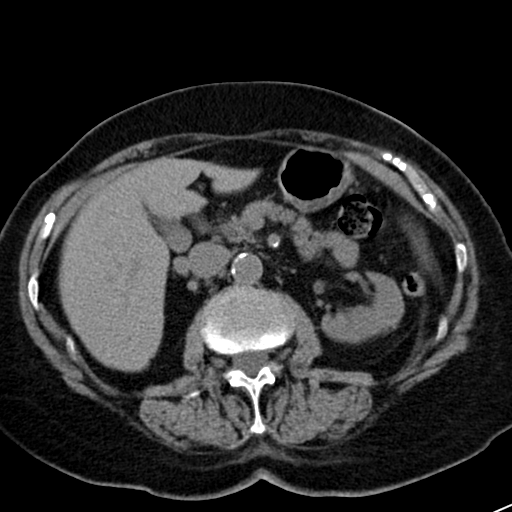

[Series 3: lung windows · axial · 0.59mm/px · z∈[+406,+676]mm · 13 of 64 slices shown, 17 images]
[im 5/64  mediastinal]
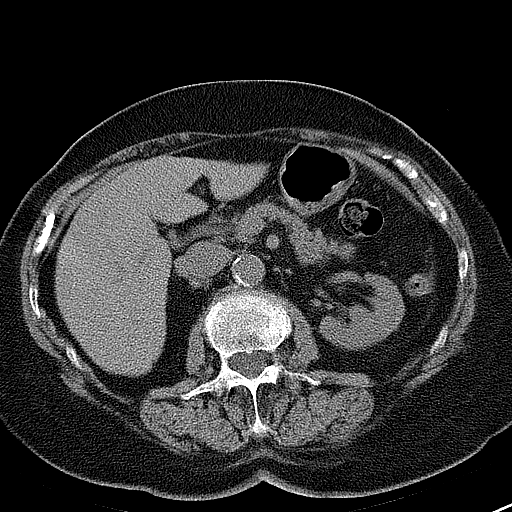
[im 5/64  lung]
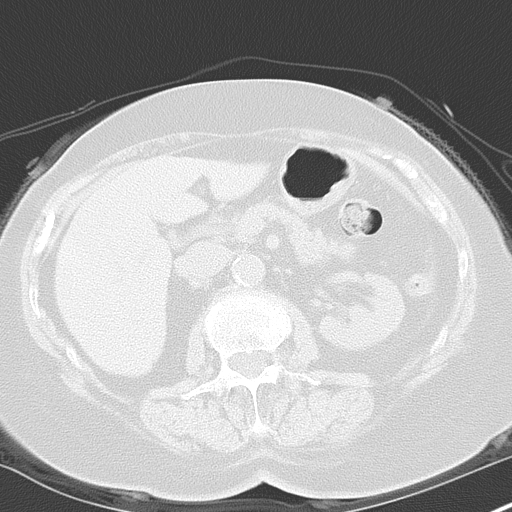
[im 10/64  lung]
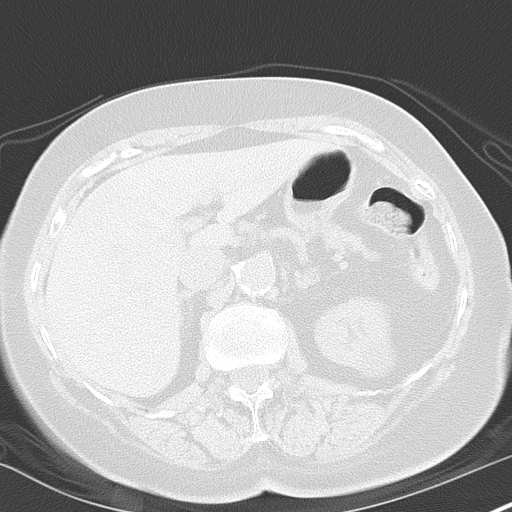
[im 15/64  lung]
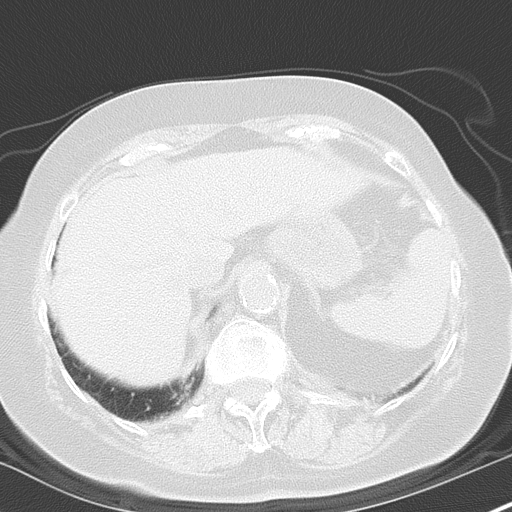
[im 20/64  lung]
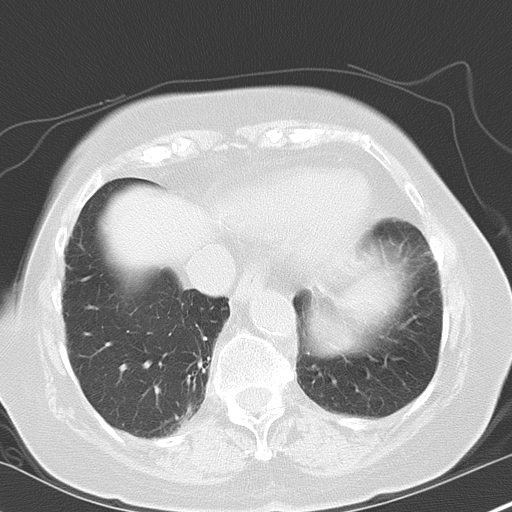
[im 25/64  mediastinal]
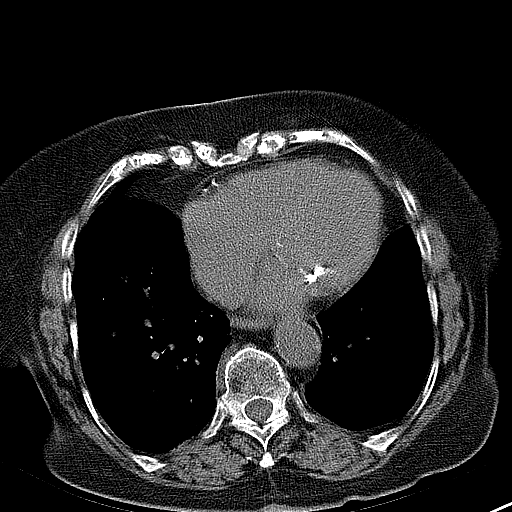
[im 25/64  lung]
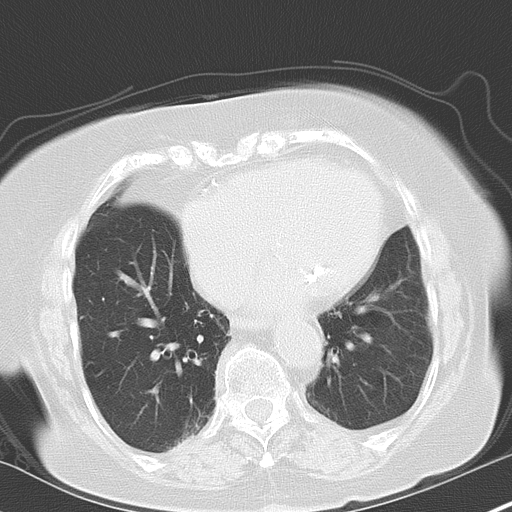
[im 30/64  lung]
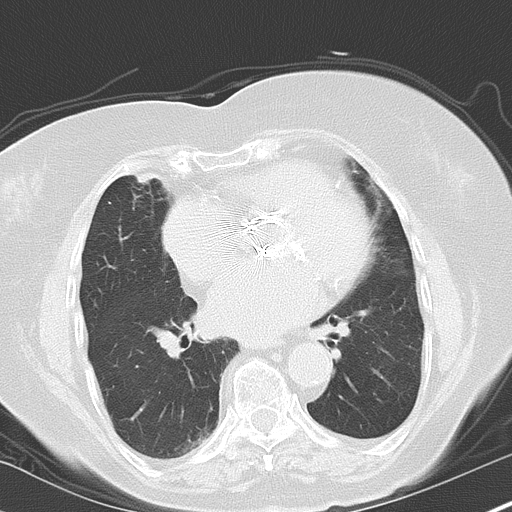
[im 32/64  lung]
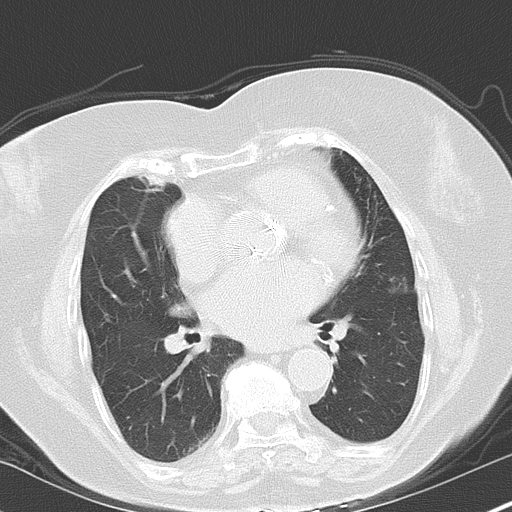
[im 34/64  lung]
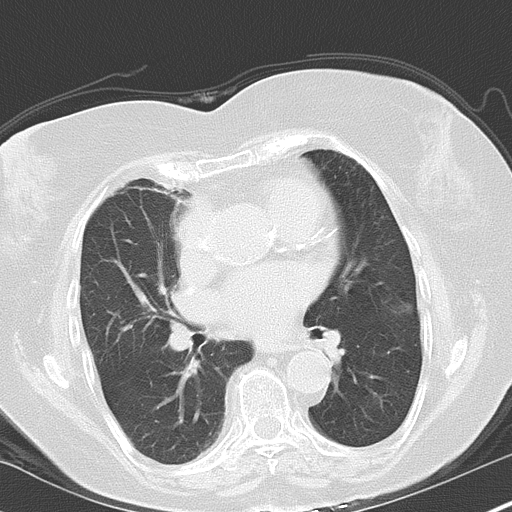
[im 39/64  mediastinal]
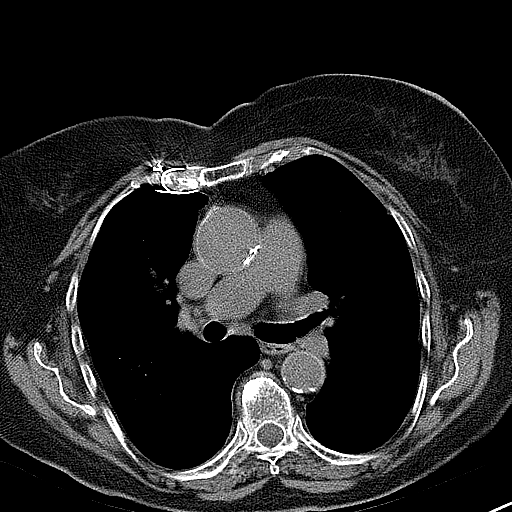
[im 39/64  lung]
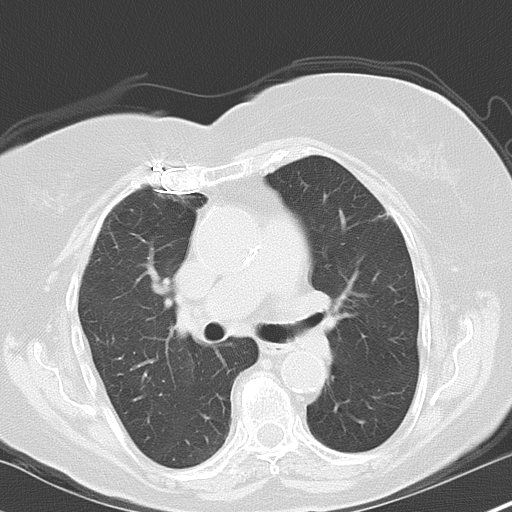
[im 44/64  lung]
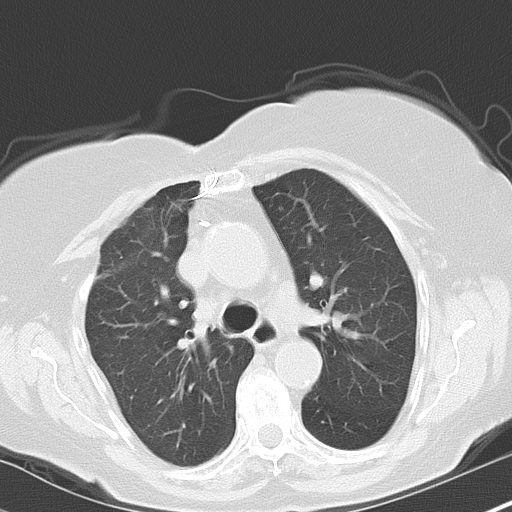
[im 49/64  lung]
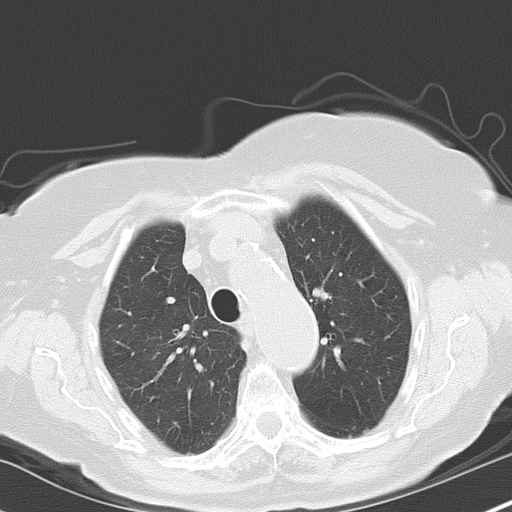
[im 54/64  lung]
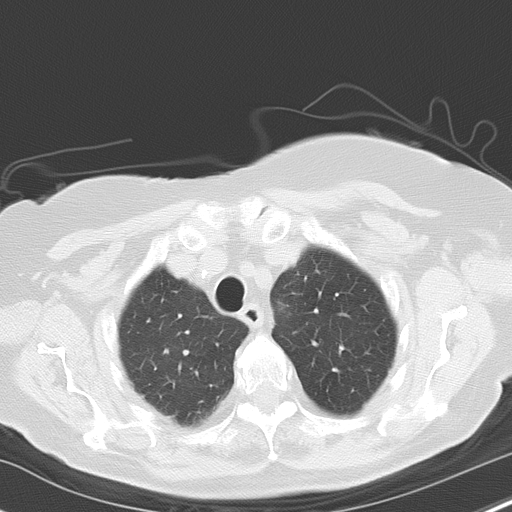
[im 59/64  mediastinal]
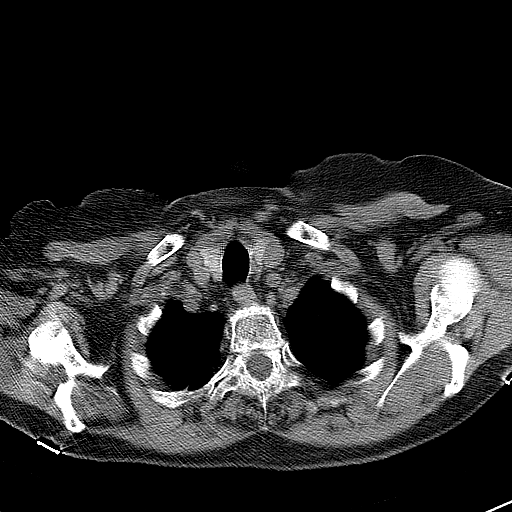
[im 59/64  lung]
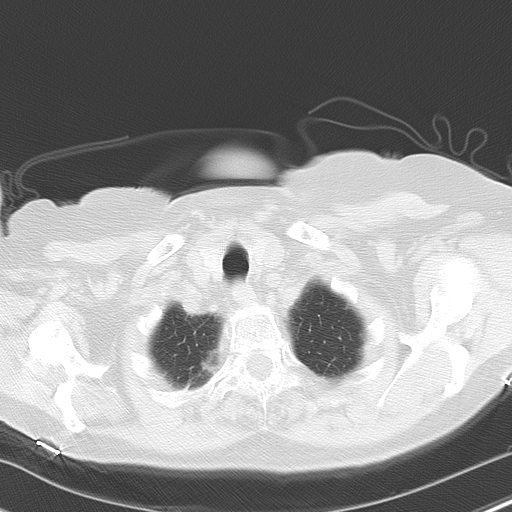

[14 of 31 positions shown; findings below may reference images not displayed]

PROCEDURE:     CT  - CT CHEST WITHOUT CONTRAST  - [DATE] [DATE]

RESULT:     Axial noncontrast CT scanning was performed through the chest at
3 mm intervals and slice thicknesses without administration of contrast.
Review of multiplanar reconstructed images was performed separately on the
VIA monitor.

At lung window settings there are emphysematous changes bilaterally. There
is minimal fibrotic change postero- medially in the right lower lobe. There
is a small amount of subpleural increased density adjacent to sternal and
costochondral wires on the right along the anterior aspect of the middle
lobe. There are similar findings in the lingula. I do not see classic
alveolar infiltrates. There is a surgical wire on the right that may
traverse lung parenchyma which is demonstrated best on image 20 of the lung
windows. I see no pneumothorax nor pneumomediastinum.

The cardiac chambers are mildly enlarged. The patient has undergone prior
aortic valve replacement. There is calcification in the wall of the thoracic
aorta. I see no findings suspicious for a false lumen. No pathologic sized
mediastinal or hilar lymph nodes are seen. I see no pleural nor pericardial
effusion.

Within the upper abdomen the observed portions of the liver are normal.
There is an exophytic hypodensity from the posterior aspect of the midpole
of the left kidney. It measures 2.3 cm in diameter and has Hounsfield
measurement of +3 most compatible with a cyst. I see no adrenal masses.

The lumbar thoracic vertebral bodies are preserved in height. There is
superior endplate oppression of L1.
IMPRESSION: 1. There are emphysematous changes in both lungs. There are fibrotic changes
as well in the areas described. I see no suspicious interstitial nor
alveolar infiltrates.
2. There is enlargement of the cardiac chambers. The patient has undergone
prior aortic valve replacement. I do not see evidence of CHF nor pleural nor
pericardial effusion.
3. Please see the discussion above regarding a sternal wire which appears to
enter the pleural space an traverse portions of the right middle lobe on
image 20. There is no evidence of a pneumothorax.

## 2011-07-22 ENCOUNTER — Ambulatory Visit: Payer: Self-pay | Admitting: Family Medicine

## 2011-07-22 IMAGING — CR DG CHEST 2V
1 series · 2 of 2 positions shown · non-contrast
Comparison: none

REASON FOR EXAM: cough fever
COMMENTS:

[Series 1: view not recorded · 0.17mm/px · 2 of 2 slices shown]
[im 1/2]
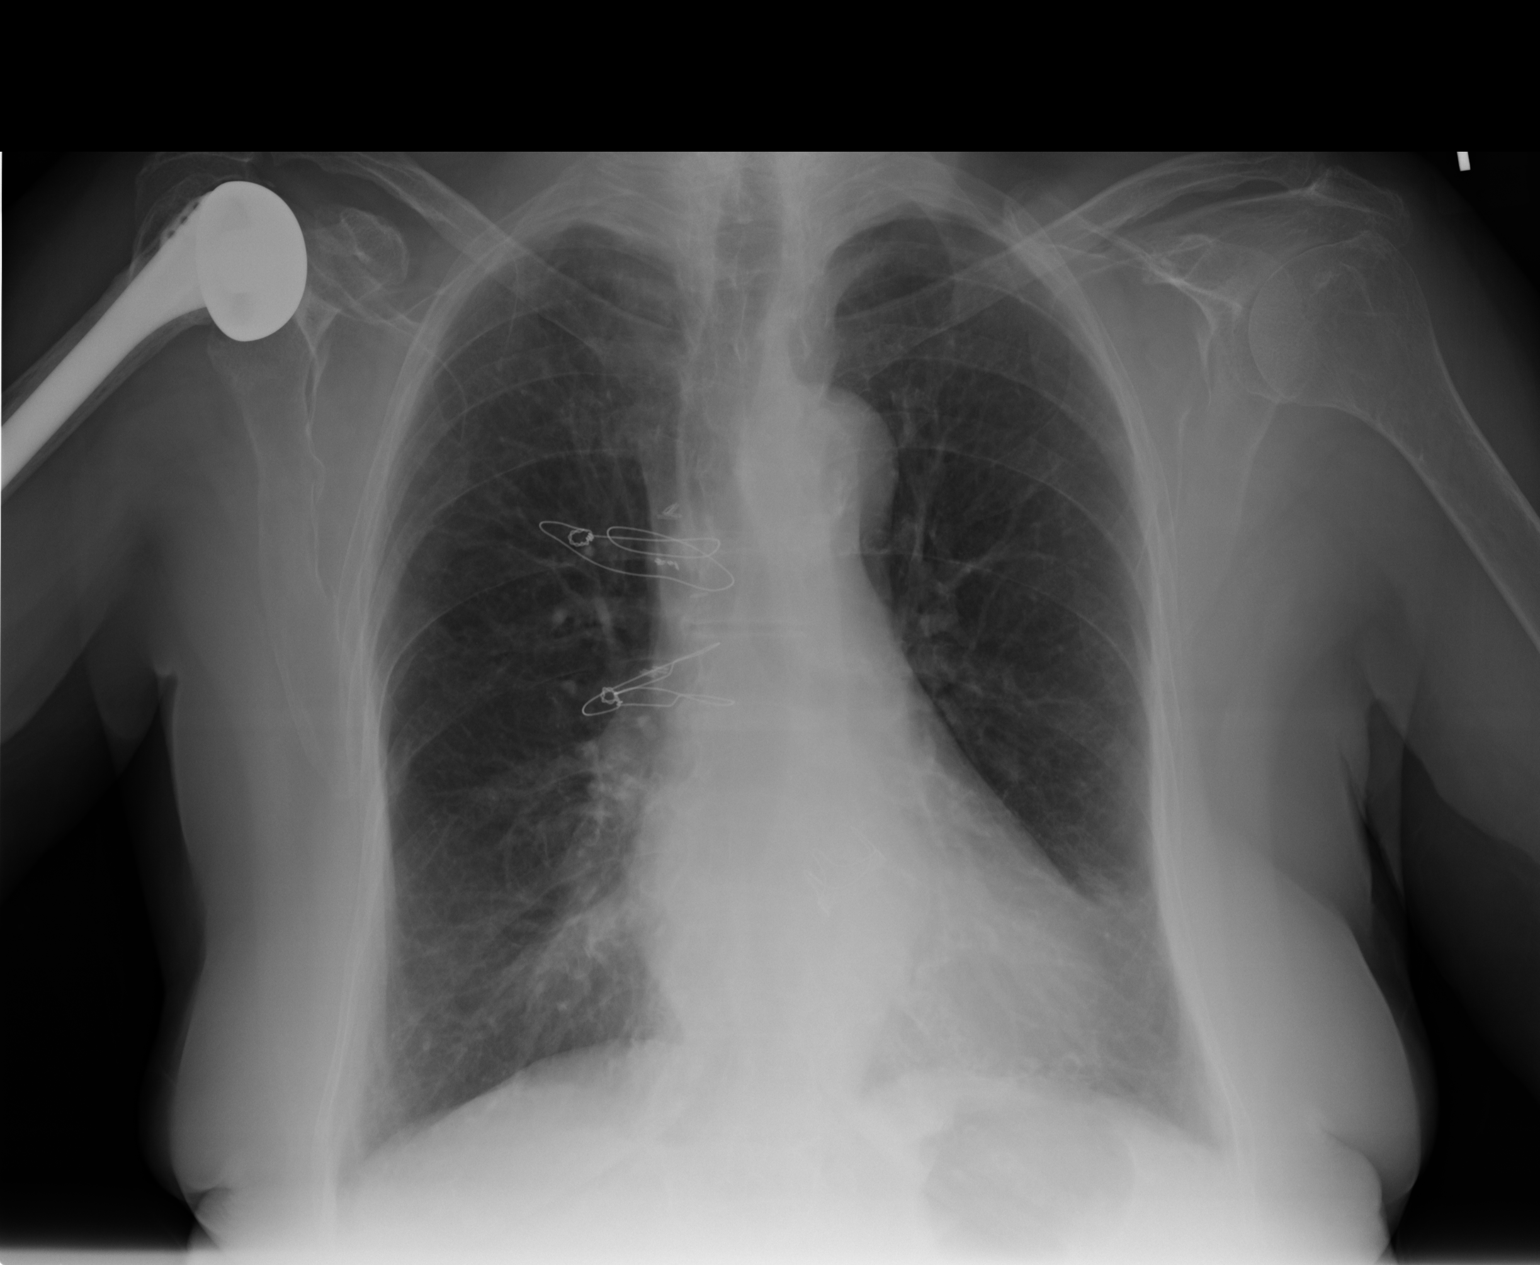
[im 2/2]
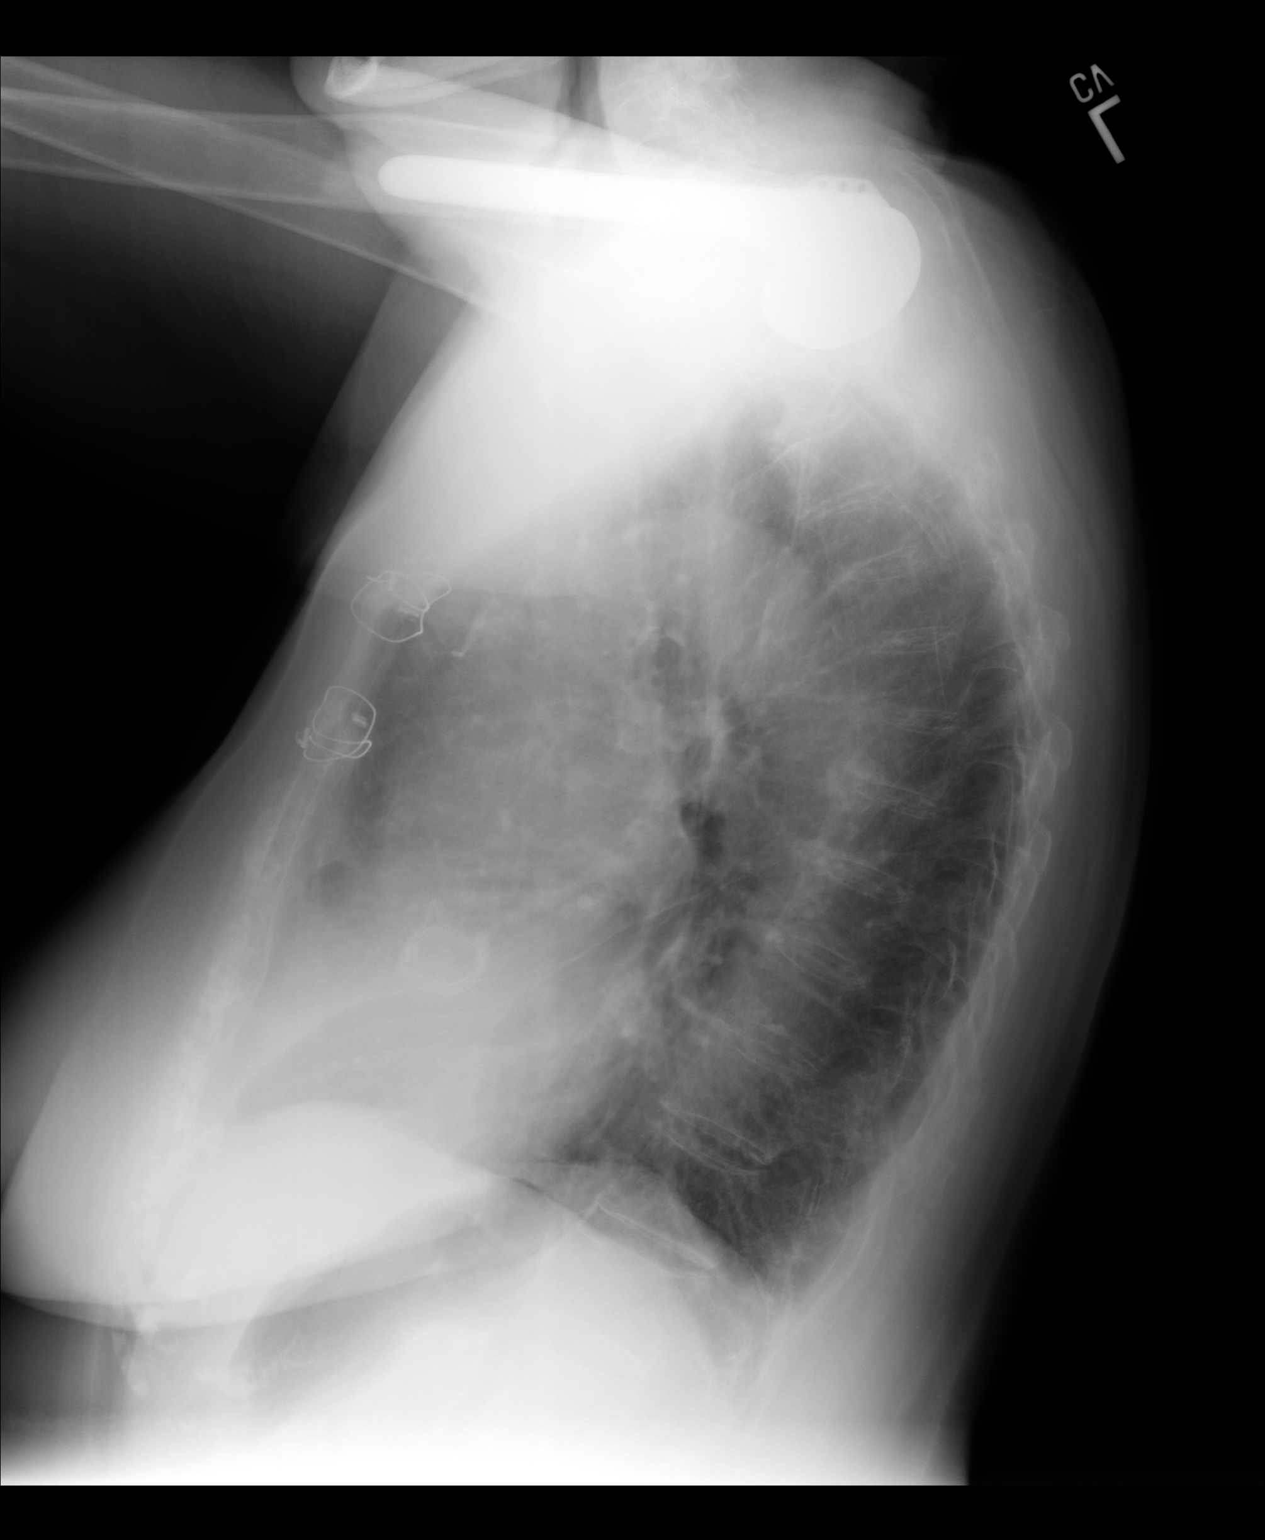

[2 of 2 positions shown; findings below may reference images not displayed]

PROCEDURE:     KDR - KDXR CHEST PA (OR AP) AND LAT  - [DATE]  [DATE]

RESULT:     Comparison is made to the prior exam of [DATE].

The lung fields are clear. No pneumonia, pneumothorax or pleural effusion is
seen. The heart is upper limits for normal in size. Sternal wire sutures are
noted. The patient has had prior right shoulder hemiarthroplasty. No acute
bony abnormalities are seen.
IMPRESSION: No acute changes are identified.

## 2011-10-04 DIAGNOSIS — H3530 Unspecified macular degeneration: Secondary | ICD-10-CM | POA: Diagnosis not present

## 2011-12-03 DIAGNOSIS — N390 Urinary tract infection, site not specified: Secondary | ICD-10-CM | POA: Diagnosis not present

## 2011-12-03 DIAGNOSIS — E785 Hyperlipidemia, unspecified: Secondary | ICD-10-CM | POA: Diagnosis not present

## 2011-12-03 DIAGNOSIS — J449 Chronic obstructive pulmonary disease, unspecified: Secondary | ICD-10-CM | POA: Diagnosis not present

## 2011-12-19 DIAGNOSIS — E782 Mixed hyperlipidemia: Secondary | ICD-10-CM | POA: Diagnosis not present

## 2012-01-03 DIAGNOSIS — J449 Chronic obstructive pulmonary disease, unspecified: Secondary | ICD-10-CM | POA: Diagnosis not present

## 2012-01-03 DIAGNOSIS — R0602 Shortness of breath: Secondary | ICD-10-CM | POA: Diagnosis not present

## 2012-01-03 DIAGNOSIS — J8410 Pulmonary fibrosis, unspecified: Secondary | ICD-10-CM | POA: Diagnosis not present

## 2012-01-03 DIAGNOSIS — I270 Primary pulmonary hypertension: Secondary | ICD-10-CM | POA: Diagnosis not present

## 2012-01-29 DIAGNOSIS — I10 Essential (primary) hypertension: Secondary | ICD-10-CM | POA: Diagnosis not present

## 2012-01-29 DIAGNOSIS — E559 Vitamin D deficiency, unspecified: Secondary | ICD-10-CM | POA: Diagnosis not present

## 2012-01-29 DIAGNOSIS — E038 Other specified hypothyroidism: Secondary | ICD-10-CM | POA: Diagnosis not present

## 2012-01-29 DIAGNOSIS — E039 Hypothyroidism, unspecified: Secondary | ICD-10-CM | POA: Diagnosis not present

## 2012-01-29 DIAGNOSIS — J449 Chronic obstructive pulmonary disease, unspecified: Secondary | ICD-10-CM | POA: Diagnosis not present

## 2012-01-29 DIAGNOSIS — J4489 Other specified chronic obstructive pulmonary disease: Secondary | ICD-10-CM | POA: Diagnosis not present

## 2012-02-19 DIAGNOSIS — I10 Essential (primary) hypertension: Secondary | ICD-10-CM | POA: Diagnosis not present

## 2012-02-19 DIAGNOSIS — Z954 Presence of other heart-valve replacement: Secondary | ICD-10-CM | POA: Diagnosis not present

## 2012-02-19 DIAGNOSIS — J449 Chronic obstructive pulmonary disease, unspecified: Secondary | ICD-10-CM | POA: Diagnosis not present

## 2012-02-19 DIAGNOSIS — I359 Nonrheumatic aortic valve disorder, unspecified: Secondary | ICD-10-CM | POA: Diagnosis not present

## 2012-06-04 DIAGNOSIS — J8410 Pulmonary fibrosis, unspecified: Secondary | ICD-10-CM | POA: Diagnosis not present

## 2012-06-04 DIAGNOSIS — E669 Obesity, unspecified: Secondary | ICD-10-CM | POA: Diagnosis not present

## 2012-06-04 DIAGNOSIS — J449 Chronic obstructive pulmonary disease, unspecified: Secondary | ICD-10-CM | POA: Diagnosis not present

## 2012-06-04 DIAGNOSIS — I270 Primary pulmonary hypertension: Secondary | ICD-10-CM | POA: Diagnosis not present

## 2012-06-30 DIAGNOSIS — E782 Mixed hyperlipidemia: Secondary | ICD-10-CM | POA: Diagnosis not present

## 2012-07-10 DIAGNOSIS — H353190 Nonexudative age-related macular degeneration, unspecified eye, stage unspecified: Secondary | ICD-10-CM | POA: Diagnosis not present

## 2012-07-22 DIAGNOSIS — Z23 Encounter for immunization: Secondary | ICD-10-CM | POA: Diagnosis not present

## 2012-07-27 DIAGNOSIS — E559 Vitamin D deficiency, unspecified: Secondary | ICD-10-CM | POA: Diagnosis not present

## 2012-07-27 DIAGNOSIS — I10 Essential (primary) hypertension: Secondary | ICD-10-CM | POA: Diagnosis not present

## 2012-07-27 DIAGNOSIS — E038 Other specified hypothyroidism: Secondary | ICD-10-CM | POA: Diagnosis not present

## 2012-07-27 DIAGNOSIS — F4320 Adjustment disorder, unspecified: Secondary | ICD-10-CM | POA: Diagnosis not present

## 2012-07-28 DIAGNOSIS — Z954 Presence of other heart-valve replacement: Secondary | ICD-10-CM | POA: Diagnosis not present

## 2012-07-28 DIAGNOSIS — R079 Chest pain, unspecified: Secondary | ICD-10-CM | POA: Diagnosis not present

## 2012-07-28 DIAGNOSIS — M940 Chondrocostal junction syndrome [Tietze]: Secondary | ICD-10-CM | POA: Diagnosis not present

## 2012-07-28 DIAGNOSIS — I359 Nonrheumatic aortic valve disorder, unspecified: Secondary | ICD-10-CM | POA: Diagnosis not present

## 2012-07-28 DIAGNOSIS — E782 Mixed hyperlipidemia: Secondary | ICD-10-CM | POA: Diagnosis not present

## 2012-11-23 DIAGNOSIS — J449 Chronic obstructive pulmonary disease, unspecified: Secondary | ICD-10-CM | POA: Diagnosis not present

## 2012-11-23 DIAGNOSIS — J8410 Pulmonary fibrosis, unspecified: Secondary | ICD-10-CM | POA: Diagnosis not present

## 2012-11-23 DIAGNOSIS — I270 Primary pulmonary hypertension: Secondary | ICD-10-CM | POA: Diagnosis not present

## 2012-11-23 DIAGNOSIS — R0602 Shortness of breath: Secondary | ICD-10-CM | POA: Diagnosis not present

## 2012-12-29 DIAGNOSIS — I270 Primary pulmonary hypertension: Secondary | ICD-10-CM | POA: Diagnosis not present

## 2013-01-12 DIAGNOSIS — E782 Mixed hyperlipidemia: Secondary | ICD-10-CM | POA: Diagnosis not present

## 2013-01-25 DIAGNOSIS — I10 Essential (primary) hypertension: Secondary | ICD-10-CM | POA: Diagnosis not present

## 2013-01-25 DIAGNOSIS — E7800 Pure hypercholesterolemia, unspecified: Secondary | ICD-10-CM | POA: Diagnosis not present

## 2013-01-25 DIAGNOSIS — E038 Other specified hypothyroidism: Secondary | ICD-10-CM | POA: Diagnosis not present

## 2013-01-25 DIAGNOSIS — F4320 Adjustment disorder, unspecified: Secondary | ICD-10-CM | POA: Diagnosis not present

## 2013-02-02 DIAGNOSIS — I10 Essential (primary) hypertension: Secondary | ICD-10-CM | POA: Diagnosis not present

## 2013-02-02 DIAGNOSIS — I2789 Other specified pulmonary heart diseases: Secondary | ICD-10-CM | POA: Diagnosis not present

## 2013-02-02 DIAGNOSIS — I359 Nonrheumatic aortic valve disorder, unspecified: Secondary | ICD-10-CM | POA: Diagnosis not present

## 2013-02-02 DIAGNOSIS — Z954 Presence of other heart-valve replacement: Secondary | ICD-10-CM | POA: Diagnosis not present

## 2013-05-04 DIAGNOSIS — R059 Cough, unspecified: Secondary | ICD-10-CM | POA: Diagnosis not present

## 2013-05-04 DIAGNOSIS — R05 Cough: Secondary | ICD-10-CM | POA: Diagnosis not present

## 2013-05-04 DIAGNOSIS — J45909 Unspecified asthma, uncomplicated: Secondary | ICD-10-CM | POA: Diagnosis not present

## 2013-06-21 DIAGNOSIS — Z23 Encounter for immunization: Secondary | ICD-10-CM | POA: Diagnosis not present

## 2013-07-21 DIAGNOSIS — E782 Mixed hyperlipidemia: Secondary | ICD-10-CM | POA: Diagnosis not present

## 2013-07-29 DIAGNOSIS — H2510 Age-related nuclear cataract, unspecified eye: Secondary | ICD-10-CM | POA: Diagnosis not present

## 2013-07-29 DIAGNOSIS — Z961 Presence of intraocular lens: Secondary | ICD-10-CM | POA: Diagnosis not present

## 2013-08-05 DIAGNOSIS — I359 Nonrheumatic aortic valve disorder, unspecified: Secondary | ICD-10-CM | POA: Diagnosis not present

## 2013-08-05 DIAGNOSIS — I10 Essential (primary) hypertension: Secondary | ICD-10-CM | POA: Diagnosis not present

## 2013-08-05 DIAGNOSIS — E782 Mixed hyperlipidemia: Secondary | ICD-10-CM | POA: Diagnosis not present

## 2013-09-15 DIAGNOSIS — J449 Chronic obstructive pulmonary disease, unspecified: Secondary | ICD-10-CM | POA: Diagnosis not present

## 2013-09-15 DIAGNOSIS — N390 Urinary tract infection, site not specified: Secondary | ICD-10-CM | POA: Diagnosis not present

## 2013-09-15 DIAGNOSIS — F4320 Adjustment disorder, unspecified: Secondary | ICD-10-CM | POA: Diagnosis not present

## 2013-09-15 DIAGNOSIS — N2881 Hypertrophy of kidney: Secondary | ICD-10-CM | POA: Diagnosis not present

## 2013-09-15 DIAGNOSIS — E7800 Pure hypercholesterolemia, unspecified: Secondary | ICD-10-CM | POA: Diagnosis not present

## 2013-11-04 DIAGNOSIS — J45909 Unspecified asthma, uncomplicated: Secondary | ICD-10-CM | POA: Diagnosis not present

## 2013-11-04 DIAGNOSIS — J449 Chronic obstructive pulmonary disease, unspecified: Secondary | ICD-10-CM | POA: Diagnosis not present

## 2014-01-25 DIAGNOSIS — E782 Mixed hyperlipidemia: Secondary | ICD-10-CM | POA: Diagnosis not present

## 2014-01-25 DIAGNOSIS — H353190 Nonexudative age-related macular degeneration, unspecified eye, stage unspecified: Secondary | ICD-10-CM | POA: Diagnosis not present

## 2014-03-11 DIAGNOSIS — N2881 Hypertrophy of kidney: Secondary | ICD-10-CM | POA: Diagnosis not present

## 2014-03-11 DIAGNOSIS — F4320 Adjustment disorder, unspecified: Secondary | ICD-10-CM | POA: Diagnosis not present

## 2014-03-11 DIAGNOSIS — E7800 Pure hypercholesterolemia, unspecified: Secondary | ICD-10-CM | POA: Diagnosis not present

## 2014-03-11 DIAGNOSIS — N390 Urinary tract infection, site not specified: Secondary | ICD-10-CM | POA: Diagnosis not present

## 2014-04-07 DIAGNOSIS — E7800 Pure hypercholesterolemia, unspecified: Secondary | ICD-10-CM | POA: Diagnosis not present

## 2014-04-07 DIAGNOSIS — F4320 Adjustment disorder, unspecified: Secondary | ICD-10-CM | POA: Diagnosis not present

## 2014-04-07 DIAGNOSIS — F329 Major depressive disorder, single episode, unspecified: Secondary | ICD-10-CM | POA: Diagnosis not present

## 2014-04-07 DIAGNOSIS — F3289 Other specified depressive episodes: Secondary | ICD-10-CM | POA: Diagnosis not present

## 2014-04-07 DIAGNOSIS — N390 Urinary tract infection, site not specified: Secondary | ICD-10-CM | POA: Diagnosis not present

## 2014-04-28 DIAGNOSIS — F3289 Other specified depressive episodes: Secondary | ICD-10-CM | POA: Diagnosis not present

## 2014-04-28 DIAGNOSIS — M109 Gout, unspecified: Secondary | ICD-10-CM | POA: Diagnosis not present

## 2014-04-28 DIAGNOSIS — E7800 Pure hypercholesterolemia, unspecified: Secondary | ICD-10-CM | POA: Diagnosis not present

## 2014-04-28 DIAGNOSIS — F4320 Adjustment disorder, unspecified: Secondary | ICD-10-CM | POA: Diagnosis not present

## 2014-04-28 DIAGNOSIS — F329 Major depressive disorder, single episode, unspecified: Secondary | ICD-10-CM | POA: Diagnosis not present

## 2014-05-25 DIAGNOSIS — R0602 Shortness of breath: Secondary | ICD-10-CM | POA: Diagnosis not present

## 2014-05-25 DIAGNOSIS — J449 Chronic obstructive pulmonary disease, unspecified: Secondary | ICD-10-CM | POA: Diagnosis not present

## 2014-05-25 DIAGNOSIS — I10 Essential (primary) hypertension: Secondary | ICD-10-CM | POA: Diagnosis not present

## 2014-05-25 DIAGNOSIS — F4320 Adjustment disorder, unspecified: Secondary | ICD-10-CM | POA: Diagnosis not present

## 2014-05-25 DIAGNOSIS — R05 Cough: Secondary | ICD-10-CM | POA: Diagnosis not present

## 2014-05-25 DIAGNOSIS — M109 Gout, unspecified: Secondary | ICD-10-CM | POA: Diagnosis not present

## 2014-05-25 DIAGNOSIS — J8410 Pulmonary fibrosis, unspecified: Secondary | ICD-10-CM | POA: Diagnosis not present

## 2014-05-25 DIAGNOSIS — R059 Cough, unspecified: Secondary | ICD-10-CM | POA: Diagnosis not present

## 2014-05-25 DIAGNOSIS — I2510 Atherosclerotic heart disease of native coronary artery without angina pectoris: Secondary | ICD-10-CM | POA: Diagnosis not present

## 2014-06-03 DIAGNOSIS — R064 Hyperventilation: Secondary | ICD-10-CM | POA: Diagnosis not present

## 2014-06-03 DIAGNOSIS — R197 Diarrhea, unspecified: Secondary | ICD-10-CM | POA: Diagnosis not present

## 2014-06-03 DIAGNOSIS — I10 Essential (primary) hypertension: Secondary | ICD-10-CM | POA: Diagnosis not present

## 2014-06-03 DIAGNOSIS — R0602 Shortness of breath: Secondary | ICD-10-CM | POA: Diagnosis not present

## 2014-06-03 DIAGNOSIS — M109 Gout, unspecified: Secondary | ICD-10-CM | POA: Diagnosis not present

## 2014-06-03 LAB — CBC AND DIFFERENTIAL
HCT: 35 % — AB (ref 36–46)
Hemoglobin: 11.8 g/dL — AB (ref 12.0–16.0)
Platelets: 276 10*3/uL (ref 150–399)
WBC: 7.1 10^3/mL

## 2014-07-11 DIAGNOSIS — N183 Chronic kidney disease, stage 3 (moderate): Secondary | ICD-10-CM | POA: Diagnosis not present

## 2014-07-11 DIAGNOSIS — Z1389 Encounter for screening for other disorder: Secondary | ICD-10-CM | POA: Diagnosis not present

## 2014-07-11 DIAGNOSIS — E038 Other specified hypothyroidism: Secondary | ICD-10-CM | POA: Diagnosis not present

## 2014-07-11 DIAGNOSIS — E785 Hyperlipidemia, unspecified: Secondary | ICD-10-CM | POA: Diagnosis not present

## 2014-07-11 DIAGNOSIS — I129 Hypertensive chronic kidney disease with stage 1 through stage 4 chronic kidney disease, or unspecified chronic kidney disease: Secondary | ICD-10-CM | POA: Diagnosis not present

## 2014-07-11 LAB — LIPID PANEL
Cholesterol: 192 mg/dL (ref 0–200)
HDL: 61 mg/dL (ref 35–70)
LDL Cholesterol: 101 mg/dL
Triglycerides: 150 mg/dL (ref 40–160)

## 2014-07-11 LAB — TSH: TSH: 4.25 u[IU]/mL (ref 0.41–5.90)

## 2014-07-21 DIAGNOSIS — Z23 Encounter for immunization: Secondary | ICD-10-CM | POA: Diagnosis not present

## 2014-07-26 DIAGNOSIS — H3531 Nonexudative age-related macular degeneration: Secondary | ICD-10-CM | POA: Diagnosis not present

## 2014-08-03 DIAGNOSIS — R0602 Shortness of breath: Secondary | ICD-10-CM | POA: Diagnosis not present

## 2014-08-03 DIAGNOSIS — I2510 Atherosclerotic heart disease of native coronary artery without angina pectoris: Secondary | ICD-10-CM | POA: Insufficient documentation

## 2014-08-03 DIAGNOSIS — I350 Nonrheumatic aortic (valve) stenosis: Secondary | ICD-10-CM | POA: Diagnosis not present

## 2014-08-03 DIAGNOSIS — I341 Nonrheumatic mitral (valve) prolapse: Secondary | ICD-10-CM | POA: Diagnosis not present

## 2014-08-03 DIAGNOSIS — E782 Mixed hyperlipidemia: Secondary | ICD-10-CM | POA: Diagnosis not present

## 2014-08-04 DIAGNOSIS — Z23 Encounter for immunization: Secondary | ICD-10-CM | POA: Diagnosis not present

## 2014-08-17 DIAGNOSIS — Z952 Presence of prosthetic heart valve: Secondary | ICD-10-CM | POA: Diagnosis not present

## 2014-08-17 DIAGNOSIS — Z1389 Encounter for screening for other disorder: Secondary | ICD-10-CM | POA: Diagnosis not present

## 2014-08-17 DIAGNOSIS — M109 Gout, unspecified: Secondary | ICD-10-CM | POA: Diagnosis not present

## 2014-08-17 DIAGNOSIS — I10 Essential (primary) hypertension: Secondary | ICD-10-CM | POA: Diagnosis not present

## 2014-08-17 DIAGNOSIS — I341 Nonrheumatic mitral (valve) prolapse: Secondary | ICD-10-CM | POA: Diagnosis not present

## 2014-08-17 DIAGNOSIS — I350 Nonrheumatic aortic (valve) stenosis: Secondary | ICD-10-CM | POA: Diagnosis not present

## 2014-08-17 DIAGNOSIS — J449 Chronic obstructive pulmonary disease, unspecified: Secondary | ICD-10-CM | POA: Diagnosis not present

## 2014-08-17 DIAGNOSIS — E785 Hyperlipidemia, unspecified: Secondary | ICD-10-CM | POA: Diagnosis not present

## 2014-08-17 DIAGNOSIS — R0602 Shortness of breath: Secondary | ICD-10-CM | POA: Diagnosis not present

## 2014-09-01 DIAGNOSIS — E785 Hyperlipidemia, unspecified: Secondary | ICD-10-CM | POA: Diagnosis not present

## 2014-09-01 LAB — BASIC METABOLIC PANEL
BUN: 30 mg/dL — AB (ref 4–21)
Creatinine: 1.5 mg/dL — AB (ref 0.5–1.1)
Glucose: 117 mg/dL
Potassium: 4.3 mmol/L (ref 3.4–5.3)
Sodium: 142 mmol/L (ref 137–147)

## 2014-09-01 LAB — HEPATIC FUNCTION PANEL
ALT: 10 U/L (ref 7–35)
AST: 14 U/L (ref 13–35)

## 2015-02-01 DIAGNOSIS — I10 Essential (primary) hypertension: Secondary | ICD-10-CM | POA: Diagnosis not present

## 2015-02-01 DIAGNOSIS — E782 Mixed hyperlipidemia: Secondary | ICD-10-CM | POA: Diagnosis not present

## 2015-02-01 DIAGNOSIS — I350 Nonrheumatic aortic (valve) stenosis: Secondary | ICD-10-CM | POA: Diagnosis not present

## 2015-02-01 DIAGNOSIS — I2510 Atherosclerotic heart disease of native coronary artery without angina pectoris: Secondary | ICD-10-CM | POA: Diagnosis not present

## 2015-03-07 DIAGNOSIS — R0602 Shortness of breath: Secondary | ICD-10-CM | POA: Diagnosis not present

## 2015-03-07 DIAGNOSIS — I350 Nonrheumatic aortic (valve) stenosis: Secondary | ICD-10-CM | POA: Diagnosis not present

## 2015-03-07 DIAGNOSIS — Z79899 Other long term (current) drug therapy: Secondary | ICD-10-CM | POA: Diagnosis not present

## 2015-03-07 DIAGNOSIS — J45909 Unspecified asthma, uncomplicated: Secondary | ICD-10-CM | POA: Diagnosis not present

## 2015-03-07 DIAGNOSIS — J449 Chronic obstructive pulmonary disease, unspecified: Secondary | ICD-10-CM | POA: Diagnosis not present

## 2015-04-12 ENCOUNTER — Other Ambulatory Visit: Payer: Self-pay | Admitting: Family Medicine

## 2015-04-12 DIAGNOSIS — E785 Hyperlipidemia, unspecified: Secondary | ICD-10-CM | POA: Insufficient documentation

## 2015-04-12 NOTE — Telephone Encounter (Signed)
Last OV 07/2014  Thanks,   -Mickel Baas

## 2015-05-12 ENCOUNTER — Other Ambulatory Visit: Payer: Self-pay | Admitting: Family Medicine

## 2015-05-12 DIAGNOSIS — I10 Essential (primary) hypertension: Secondary | ICD-10-CM

## 2015-05-12 NOTE — Telephone Encounter (Signed)
Last ov 08/17/14

## 2015-06-07 DIAGNOSIS — H3531 Nonexudative age-related macular degeneration: Secondary | ICD-10-CM | POA: Diagnosis not present

## 2015-06-16 DIAGNOSIS — H3531 Nonexudative age-related macular degeneration: Secondary | ICD-10-CM | POA: Diagnosis not present

## 2015-07-06 DIAGNOSIS — H538 Other visual disturbances: Secondary | ICD-10-CM | POA: Diagnosis not present

## 2015-07-11 ENCOUNTER — Other Ambulatory Visit: Payer: Self-pay | Admitting: Family Medicine

## 2015-07-11 DIAGNOSIS — E559 Vitamin D deficiency, unspecified: Secondary | ICD-10-CM | POA: Insufficient documentation

## 2015-07-11 DIAGNOSIS — M545 Low back pain, unspecified: Secondary | ICD-10-CM | POA: Insufficient documentation

## 2015-07-11 DIAGNOSIS — E785 Hyperlipidemia, unspecified: Secondary | ICD-10-CM

## 2015-07-11 DIAGNOSIS — I10 Essential (primary) hypertension: Secondary | ICD-10-CM

## 2015-07-17 DIAGNOSIS — Z23 Encounter for immunization: Secondary | ICD-10-CM | POA: Diagnosis not present

## 2015-08-02 DIAGNOSIS — I2510 Atherosclerotic heart disease of native coronary artery without angina pectoris: Secondary | ICD-10-CM | POA: Diagnosis not present

## 2015-08-02 DIAGNOSIS — E782 Mixed hyperlipidemia: Secondary | ICD-10-CM | POA: Diagnosis not present

## 2015-08-02 DIAGNOSIS — I341 Nonrheumatic mitral (valve) prolapse: Secondary | ICD-10-CM | POA: Diagnosis not present

## 2015-08-02 DIAGNOSIS — Z8679 Personal history of other diseases of the circulatory system: Secondary | ICD-10-CM | POA: Diagnosis not present

## 2015-08-02 DIAGNOSIS — I350 Nonrheumatic aortic (valve) stenosis: Secondary | ICD-10-CM | POA: Diagnosis not present

## 2015-08-02 DIAGNOSIS — Z952 Presence of prosthetic heart valve: Secondary | ICD-10-CM | POA: Diagnosis not present

## 2015-08-02 DIAGNOSIS — I10 Essential (primary) hypertension: Secondary | ICD-10-CM | POA: Diagnosis not present

## 2015-08-11 DIAGNOSIS — H353130 Nonexudative age-related macular degeneration, bilateral, stage unspecified: Secondary | ICD-10-CM | POA: Diagnosis not present

## 2015-08-29 DIAGNOSIS — R0609 Other forms of dyspnea: Secondary | ICD-10-CM | POA: Diagnosis not present

## 2015-08-29 DIAGNOSIS — J8410 Pulmonary fibrosis, unspecified: Secondary | ICD-10-CM | POA: Diagnosis not present

## 2015-08-29 DIAGNOSIS — J449 Chronic obstructive pulmonary disease, unspecified: Secondary | ICD-10-CM | POA: Diagnosis not present

## 2015-08-29 DIAGNOSIS — J45909 Unspecified asthma, uncomplicated: Secondary | ICD-10-CM | POA: Diagnosis not present

## 2015-09-08 ENCOUNTER — Other Ambulatory Visit: Payer: Self-pay | Admitting: Family Medicine

## 2015-09-08 DIAGNOSIS — I2510 Atherosclerotic heart disease of native coronary artery without angina pectoris: Secondary | ICD-10-CM | POA: Insufficient documentation

## 2015-09-08 DIAGNOSIS — E785 Hyperlipidemia, unspecified: Secondary | ICD-10-CM

## 2015-09-08 DIAGNOSIS — I10 Essential (primary) hypertension: Secondary | ICD-10-CM

## 2015-09-08 DIAGNOSIS — J449 Chronic obstructive pulmonary disease, unspecified: Secondary | ICD-10-CM | POA: Insufficient documentation

## 2015-09-28 ENCOUNTER — Other Ambulatory Visit: Payer: Self-pay

## 2015-09-28 DIAGNOSIS — M810 Age-related osteoporosis without current pathological fracture: Secondary | ICD-10-CM | POA: Insufficient documentation

## 2015-09-28 DIAGNOSIS — E039 Hypothyroidism, unspecified: Secondary | ICD-10-CM | POA: Insufficient documentation

## 2015-09-28 DIAGNOSIS — N1832 Chronic kidney disease, stage 3b: Secondary | ICD-10-CM | POA: Insufficient documentation

## 2015-09-28 DIAGNOSIS — R011 Cardiac murmur, unspecified: Secondary | ICD-10-CM | POA: Insufficient documentation

## 2015-09-28 DIAGNOSIS — N189 Chronic kidney disease, unspecified: Secondary | ICD-10-CM | POA: Insufficient documentation

## 2015-09-28 DIAGNOSIS — H3530 Unspecified macular degeneration: Secondary | ICD-10-CM | POA: Insufficient documentation

## 2015-09-28 DIAGNOSIS — N183 Chronic kidney disease, stage 3 (moderate): Secondary | ICD-10-CM

## 2015-09-28 DIAGNOSIS — F4329 Adjustment disorder with other symptoms: Secondary | ICD-10-CM | POA: Insufficient documentation

## 2015-09-28 DIAGNOSIS — F4320 Adjustment disorder, unspecified: Secondary | ICD-10-CM | POA: Insufficient documentation

## 2015-09-28 DIAGNOSIS — H269 Unspecified cataract: Secondary | ICD-10-CM | POA: Insufficient documentation

## 2015-09-28 DIAGNOSIS — J449 Chronic obstructive pulmonary disease, unspecified: Secondary | ICD-10-CM | POA: Insufficient documentation

## 2015-09-28 DIAGNOSIS — E038 Other specified hypothyroidism: Secondary | ICD-10-CM | POA: Insufficient documentation

## 2015-09-28 DIAGNOSIS — I350 Nonrheumatic aortic (valve) stenosis: Secondary | ICD-10-CM | POA: Insufficient documentation

## 2015-09-28 DIAGNOSIS — Z8739 Personal history of other diseases of the musculoskeletal system and connective tissue: Secondary | ICD-10-CM | POA: Insufficient documentation

## 2015-10-02 ENCOUNTER — Ambulatory Visit: Payer: Self-pay | Admitting: Family Medicine

## 2015-10-11 ENCOUNTER — Ambulatory Visit (INDEPENDENT_AMBULATORY_CARE_PROVIDER_SITE_OTHER): Payer: Medicare Other | Admitting: Family Medicine

## 2015-10-11 ENCOUNTER — Encounter: Payer: Self-pay | Admitting: Family Medicine

## 2015-10-11 VITALS — BP 124/64 | HR 64 | Temp 98.1°F | Resp 16 | Ht 61.0 in | Wt 136.0 lb

## 2015-10-11 DIAGNOSIS — M109 Gout, unspecified: Secondary | ICD-10-CM | POA: Diagnosis not present

## 2015-10-11 DIAGNOSIS — E038 Other specified hypothyroidism: Secondary | ICD-10-CM | POA: Diagnosis not present

## 2015-10-11 DIAGNOSIS — F329 Major depressive disorder, single episode, unspecified: Secondary | ICD-10-CM | POA: Diagnosis not present

## 2015-10-11 DIAGNOSIS — F32A Depression, unspecified: Secondary | ICD-10-CM

## 2015-10-11 DIAGNOSIS — E785 Hyperlipidemia, unspecified: Secondary | ICD-10-CM | POA: Diagnosis not present

## 2015-10-11 DIAGNOSIS — I10 Essential (primary) hypertension: Secondary | ICD-10-CM

## 2015-10-11 DIAGNOSIS — E039 Hypothyroidism, unspecified: Secondary | ICD-10-CM

## 2015-10-11 NOTE — Progress Notes (Signed)
Patient ID: LODA OLBRICH, female   DOB: 12-Jul-1924, 82 y.o.   MRN: [id number]         Patient: MARGURITTE FLORENTINO Female    DOB: 08/15/1924   82 y.o.   MRN: [id number] Visit Date: 10/11/2015  Today's Provider: Margarita Rana, MD   Chief Complaint  Patient presents with  . Hypertension  . Hyperlipidemia  . COPD   Subjective:    Hypertension This is a chronic problem. The problem is unchanged. The problem is controlled. Pertinent negatives include no chest pain, headaches, malaise/fatigue, neck pain, orthopnea, palpitations, peripheral edema or shortness of breath. There are no compliance problems.   Hyperlipidemia This is a chronic problem. The problem is controlled. Recent lipid tests were reviewed and are normal. Pertinent negatives include no chest pain, focal weakness, leg pain, myalgias or shortness of breath. Current antihyperlipidemic treatment includes statins. There are no compliance problems.   Breathing Problem There is no chest tightness, cough, difficulty breathing, frequent throat clearing, shortness of breath or wheezing. This is a chronic problem. The problem has been unchanged (Pt reports her breathing is under good control with Advair.). Pertinent negatives include no chest pain, headaches, malaise/fatigue or myalgias. Her past medical history is significant for asthma and COPD.   Lab Results  Component Value Date   CHOL 192 07/11/2014   HDL 61 07/11/2014   LDLCALC 101 07/11/2014   TRIG 150 07/11/2014        Allergies  Allergen Reactions  . Azithromycin   . Iodine   . Shellfish Allergy   . Sulfa Antibiotics   . Sulfamethoxazole-Trimethoprim    Previous Medications   ADVAIR DISKUS 250-50 MCG/DOSE AEPB    USE 1 INHALATION BY MOUTH EVERY 12 HOURS. <<RINSE MOUTH AFTER USE>>.   ALBUTEROL (ACCUNEB) 0.63 MG/3ML NEBULIZER SOLUTION    Inhale into the lungs.   ASPIRIN 81 MG TABLET    Take by mouth.   CHOLECALCIFEROL 1000 UNITS CAPSULE    Take by mouth.   ESCITALOPRAM (LEXAPRO) 10 MG TABLET    Take by mouth.   METOPROLOL (LOPRESSOR) 100 MG TABLET    TAKE 1 TABLET BY MOUTH TWICE DAILY   SIMVASTATIN (ZOCOR) 20 MG TABLET    TAKE ONE TABLET BY MOUTH AT BEDTIME.   TRIAMTERENE-HYDROCHLOROTHIAZIDE (MAXZIDE) 75-50 MG TABLET    TAKE 1 TABLET BY MOUTH ONCE DAILY.    Review of Systems  Constitutional: Negative.  Negative for malaise/fatigue.  Respiratory: Negative.  Negative for cough, shortness of breath and wheezing.   Cardiovascular: Negative.  Negative for chest pain, palpitations and orthopnea.  Gastrointestinal: Negative.   Musculoskeletal: Negative.  Negative for myalgias and neck pain.  Neurological: Positive for light-headedness (Occasionally ). Negative for dizziness, tremors, focal weakness, weakness and headaches.    Social History  Substance Use Topics  . Smoking status: Never Smoker   . Smokeless tobacco: Never Used  . Alcohol Use: No   Objective:   BP 124/64 mmHg  Pulse 64  Temp(Src) 98.1 F (36.7 C) (Oral)  Resp 16  Ht 5\' 1"  (1.549 m)  Wt 136 lb (61.689 kg)  BMI 25.71 kg/m2  Physical Exam  Constitutional: She is oriented to person, place, and time. She appears well-developed and well-nourished.  Cardiovascular: Normal rate, regular rhythm and normal heart sounds.   Pulmonary/Chest: Effort normal and breath sounds normal.  Neurological: She is alert and oriented to person, place, and time.  Psychiatric: She has a normal mood and affect. Her behavior is normal. Judgment  and thought content normal.        Assessment & Plan:     1. Essential hypertension Stable, will check labs.  - CBC with Differential/Platelet - Comprehensive metabolic panel  2. Subclinical hypothyroidism Will recheck today.  - TSH  3. Hyperlipemia Stable, will check labs.  - Lipid panel  4. Clinical depression Pt reports feeling better, her brother past away a few weeks ago.  Stable.   5. Gout, unspecified cause, unspecified  chronicity, unspecified site Will check labs today.  - Uric acid     Patient was seen and examined by Jerrell Belfast, MD, and note scribed by Ashley Royalty, CMA.  I have reviewed the document for accuracy and completeness and I agree with above. - Jerrell Belfast, MD   Margarita Rana, MD  [facility]

## 2015-10-12 LAB — COMPREHENSIVE METABOLIC PANEL
ALT: 8 IU/L (ref 0–32)
AST: 15 IU/L (ref 0–40)
Albumin/Globulin Ratio: 1.6 (ref 1.1–2.5)
Albumin: 4.1 g/dL (ref 3.5–4.7)
Alkaline Phosphatase: 91 IU/L (ref 39–117)
BUN/Creatinine Ratio: 14 (ref 11–26)
BUN: 25 mg/dL (ref 8–27)
Bilirubin Total: 0.2 mg/dL (ref 0.0–1.2)
CO2: 24 mmol/L (ref 18–29)
Calcium: 9.8 mg/dL (ref 8.7–10.3)
Chloride: 103 mmol/L (ref 96–106)
Creatinine, Ser: 1.75 mg/dL — ABNORMAL HIGH (ref 0.57–1.00)
GFR calc Af Amer: 29 mL/min/{1.73_m2} — ABNORMAL LOW (ref >59)
GFR calc non Af Amer: 25 mL/min/{1.73_m2} — ABNORMAL LOW (ref >59)
Globulin, Total: 2.6 g/dL (ref 1.5–4.5)
Glucose: 118 mg/dL — ABNORMAL HIGH (ref 65–99)
Potassium: 5.3 mmol/L — ABNORMAL HIGH (ref 3.5–5.2)
Sodium: 141 mmol/L (ref 134–144)
Total Protein: 6.7 g/dL (ref 6.0–8.5)

## 2015-10-12 LAB — LIPID PANEL
Chol/HDL Ratio: 2.7 ratio units (ref 0.0–4.4)
Cholesterol, Total: 182 mg/dL (ref 100–199)
HDL: 67 mg/dL (ref >39)
LDL Calculated: 94 mg/dL (ref 0–99)
Triglycerides: 105 mg/dL (ref 0–149)
VLDL Cholesterol Cal: 21 mg/dL (ref 5–40)

## 2015-10-12 LAB — CBC WITH DIFFERENTIAL/PLATELET
Basophils Absolute: 0.1 10*3/uL (ref 0.0–0.2)
Basos: 1 %
EOS (ABSOLUTE): 0.4 10*3/uL (ref 0.0–0.4)
Eos: 5 %
Hematocrit: 35.6 % (ref 34.0–46.6)
Hemoglobin: 11.9 g/dL (ref 11.1–15.9)
Immature Grans (Abs): 0 10*3/uL (ref 0.0–0.1)
Immature Granulocytes: 0 %
Lymphocytes Absolute: 1.7 10*3/uL (ref 0.7–3.1)
Lymphs: 24 %
MCH: 30.1 pg (ref 26.6–33.0)
MCHC: 33.4 g/dL (ref 31.5–35.7)
MCV: 90 fL (ref 79–97)
Monocytes Absolute: 0.7 10*3/uL (ref 0.1–0.9)
Monocytes: 10 %
Neutrophils Absolute: 4.1 10*3/uL (ref 1.4–7.0)
Neutrophils: 60 %
Platelets: 242 10*3/uL (ref 150–379)
RBC: 3.95 x10E6/uL (ref 3.77–5.28)
RDW: 13.2 % (ref 12.3–15.4)
WBC: 6.9 10*3/uL (ref 3.4–10.8)

## 2015-10-12 LAB — TSH: TSH: 4.33 u[IU]/mL (ref 0.450–4.500)

## 2015-10-12 LAB — URIC ACID: Uric Acid: 9.1 mg/dL — ABNORMAL HIGH (ref 2.5–7.1)

## 2015-10-24 ENCOUNTER — Telehealth: Payer: Self-pay | Admitting: Family Medicine

## 2015-10-24 DIAGNOSIS — N183 Chronic kidney disease, stage 3 unspecified: Secondary | ICD-10-CM

## 2015-10-24 DIAGNOSIS — M109 Gout, unspecified: Secondary | ICD-10-CM

## 2015-10-24 NOTE — Telephone Encounter (Signed)
Printed out lab slip. Thanks.

## 2015-11-01 DIAGNOSIS — M109 Gout, unspecified: Secondary | ICD-10-CM | POA: Diagnosis not present

## 2015-11-01 DIAGNOSIS — N183 Chronic kidney disease, stage 3 (moderate): Secondary | ICD-10-CM | POA: Diagnosis not present

## 2015-11-02 ENCOUNTER — Other Ambulatory Visit: Payer: Self-pay | Admitting: Family Medicine

## 2015-11-02 DIAGNOSIS — R609 Edema, unspecified: Secondary | ICD-10-CM

## 2015-11-02 LAB — COMPREHENSIVE METABOLIC PANEL
ALT: 21 IU/L (ref 0–32)
AST: 20 IU/L (ref 0–40)
Albumin/Globulin Ratio: 1.6 (ref 1.1–2.5)
Albumin: 4 g/dL (ref 3.5–4.7)
Alkaline Phosphatase: 83 IU/L (ref 39–117)
BUN/Creatinine Ratio: 19 (ref 11–26)
BUN: 21 mg/dL (ref 8–27)
Bilirubin Total: 0.3 mg/dL (ref 0.0–1.2)
CO2: 21 mmol/L (ref 18–29)
Calcium: 9.5 mg/dL (ref 8.7–10.3)
Chloride: 101 mmol/L (ref 96–106)
Creatinine, Ser: 1.12 mg/dL — ABNORMAL HIGH (ref 0.57–1.00)
GFR calc Af Amer: 50 mL/min/{1.73_m2} — ABNORMAL LOW (ref >59)
GFR calc non Af Amer: 44 mL/min/{1.73_m2} — ABNORMAL LOW (ref >59)
Globulin, Total: 2.5 g/dL (ref 1.5–4.5)
Glucose: 90 mg/dL (ref 65–99)
Potassium: 4.7 mmol/L (ref 3.5–5.2)
Sodium: 143 mmol/L (ref 134–144)
Total Protein: 6.5 g/dL (ref 6.0–8.5)

## 2015-11-02 LAB — URIC ACID: Uric Acid: 7.4 mg/dL — ABNORMAL HIGH (ref 2.5–7.1)

## 2015-11-02 MED ORDER — SPIRONOLACTONE 25 MG PO TABS: 25.0000 mg | ORAL_TABLET | Freq: Every day | ORAL | Status: DC

## 2015-11-02 NOTE — Telephone Encounter (Signed)
Started medication. Having swelling off Maxzide. Does have history of gout. Will start Spironolactone and recheck labs in one week. Thanks.

## 2015-11-08 ENCOUNTER — Other Ambulatory Visit: Payer: Self-pay | Admitting: Family Medicine

## 2015-11-08 DIAGNOSIS — E785 Hyperlipidemia, unspecified: Secondary | ICD-10-CM

## 2015-11-08 DIAGNOSIS — I10 Essential (primary) hypertension: Secondary | ICD-10-CM

## 2015-11-11 ENCOUNTER — Emergency Department: Payer: Medicare Other

## 2015-11-11 ENCOUNTER — Emergency Department
Admission: EM | Admit: 2015-11-11 | Discharge: 2015-11-11 | Disposition: A | Discharge status: Home / Self Care | Payer: Medicare Other | Attending: Emergency Medicine | Admitting: Emergency Medicine

## 2015-11-11 DIAGNOSIS — Z7982 Long term (current) use of aspirin: Secondary | ICD-10-CM | POA: Diagnosis not present

## 2015-11-11 DIAGNOSIS — S42295A Other nondisplaced fracture of upper end of left humerus, initial encounter for closed fracture: Secondary | ICD-10-CM | POA: Insufficient documentation

## 2015-11-11 DIAGNOSIS — Y998 Other external cause status: Secondary | ICD-10-CM | POA: Insufficient documentation

## 2015-11-11 DIAGNOSIS — N183 Chronic kidney disease, stage 3 (moderate): Secondary | ICD-10-CM | POA: Insufficient documentation

## 2015-11-11 DIAGNOSIS — Z79899 Other long term (current) drug therapy: Secondary | ICD-10-CM | POA: Diagnosis not present

## 2015-11-11 DIAGNOSIS — W010XXA Fall on same level from slipping, tripping and stumbling without subsequent striking against object, initial encounter: Secondary | ICD-10-CM | POA: Diagnosis not present

## 2015-11-11 DIAGNOSIS — Y92009 Unspecified place in unspecified non-institutional (private) residence as the place of occurrence of the external cause: Secondary | ICD-10-CM | POA: Diagnosis not present

## 2015-11-11 DIAGNOSIS — S4992XA Unspecified injury of left shoulder and upper arm, initial encounter: Secondary | ICD-10-CM | POA: Diagnosis not present

## 2015-11-11 DIAGNOSIS — M79602 Pain in left arm: Secondary | ICD-10-CM | POA: Diagnosis not present

## 2015-11-11 DIAGNOSIS — M79622 Pain in left upper arm: Secondary | ICD-10-CM | POA: Diagnosis not present

## 2015-11-11 DIAGNOSIS — S42202A Unspecified fracture of upper end of left humerus, initial encounter for closed fracture: Secondary | ICD-10-CM

## 2015-11-11 DIAGNOSIS — I129 Hypertensive chronic kidney disease with stage 1 through stage 4 chronic kidney disease, or unspecified chronic kidney disease: Secondary | ICD-10-CM | POA: Diagnosis not present

## 2015-11-11 DIAGNOSIS — Y9389 Activity, other specified: Secondary | ICD-10-CM | POA: Insufficient documentation

## 2015-11-11 DIAGNOSIS — W19XXXA Unspecified fall, initial encounter: Secondary | ICD-10-CM | POA: Diagnosis not present

## 2015-11-11 IMAGING — CR DG HUMERUS 2V *L*
1 series · 2 of 2 positions shown · non-contrast
Comparison: Chest radiograph dated [DATE]

CLINICAL DATA: 89-year-old female with fall and left upper
extremity pain.

EXAM:
LEFT HUMERUS - 2+ VIEW

[Series 1: dg humerus left · 0.14mm/px · 2 of 2 slices shown]
[im 1/2]
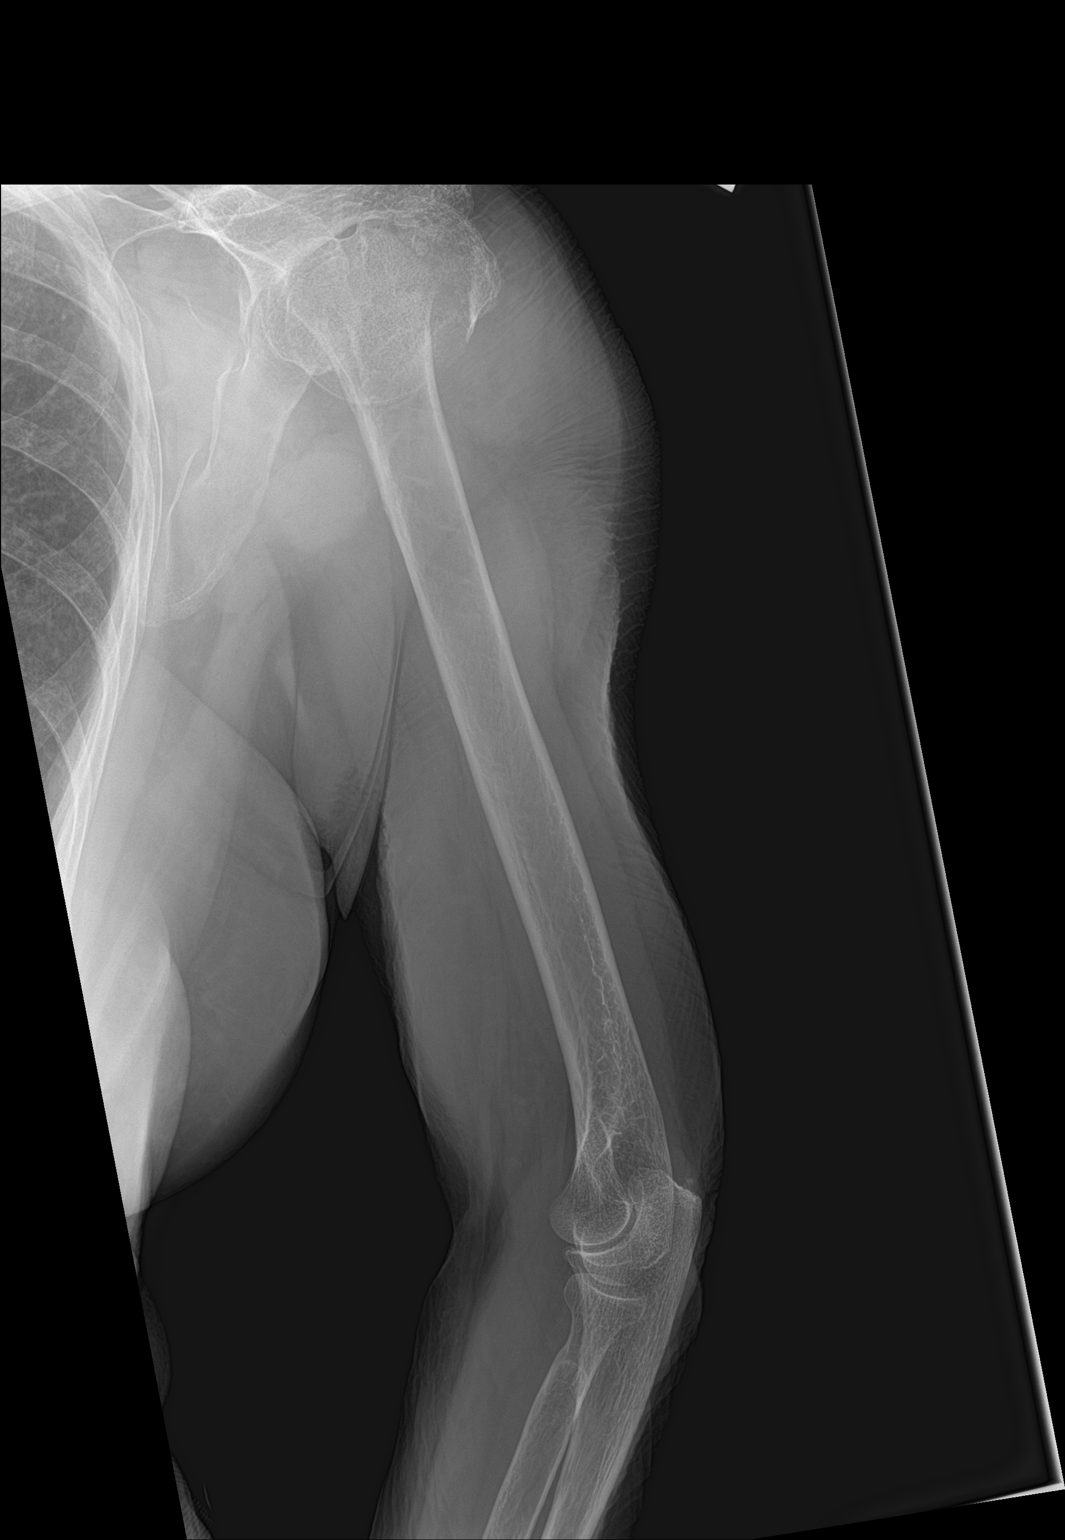
[im 2/2]
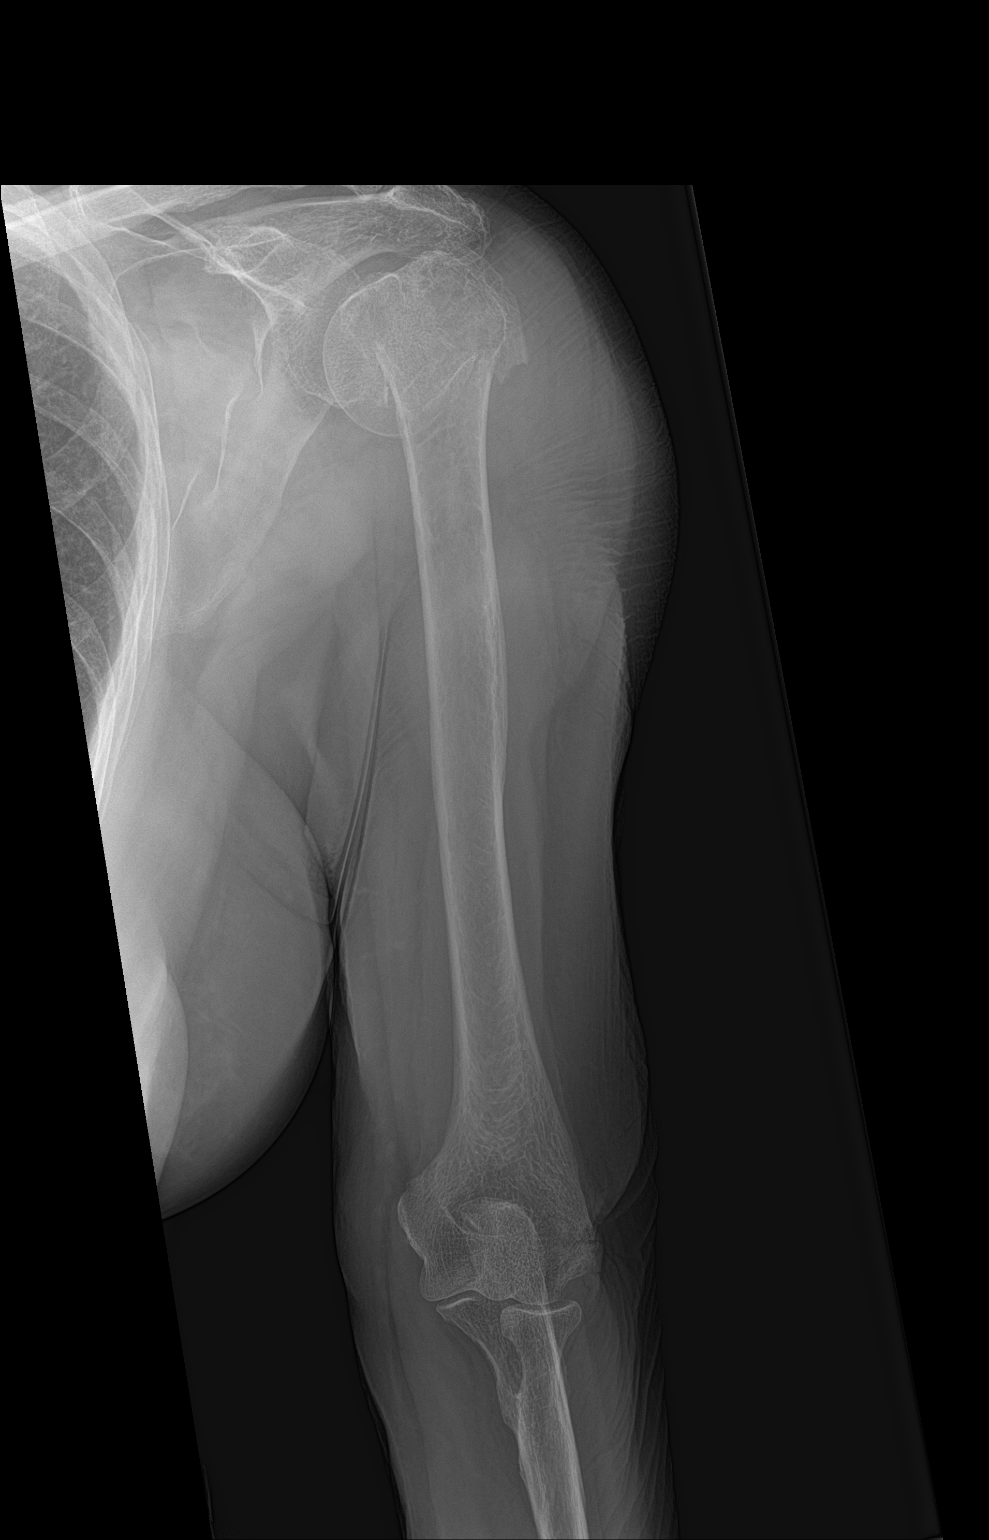

[2 of 2 positions shown; findings below may reference images not displayed]

FINDINGS: There is impacted fracture of the left humeral neck with resulting
mild shortening of the left upper extremity. There is advanced
osteopenia. No definite dislocation identified. A faint linear
lucency through the lateral epicondyle may be artifactual or
represent a nondisplaced hairline fracture. Clinical correlation is
recommended. The soft tissues appear unremarkable.
IMPRESSION: Osteopenia with fracture of the left humeral neck.

Artifact versus nondisplaced hairline fracture of the lateral
epicondyle. Clinical correlation is recommended.

## 2015-11-11 MED ORDER — MORPHINE SULFATE (PF) 4 MG/ML IV SOLN
INTRAVENOUS | Status: AC
  Administered 2015-11-11: 4 mg via INTRAVENOUS
  Filled 2015-11-11: qty 1

## 2015-11-11 MED ORDER — MORPHINE SULFATE (PF) 4 MG/ML IV SOLN
4.0000 mg | Freq: Once | INTRAVENOUS | Status: AC
  Administered 2015-11-11: 4 mg via INTRAVENOUS

## 2015-11-11 MED ORDER — ONDANSETRON HCL 4 MG/2ML IJ SOLN
4.0000 mg | Freq: Once | INTRAMUSCULAR | Status: AC
  Administered 2015-11-11: 4 mg via INTRAVENOUS

## 2015-11-11 MED ORDER — ONDANSETRON 4 MG PO TBDP: 4.0000 mg | ORAL_TABLET | Freq: Three times a day (TID) | ORAL | Status: DC | PRN

## 2015-11-11 MED ORDER — ONDANSETRON HCL 4 MG/2ML IJ SOLN
INTRAMUSCULAR | Status: AC
Start: 2015-11-11 — End: 2015-11-11
  Administered 2015-11-11: 4 mg via INTRAVENOUS
  Filled 2015-11-11: qty 2

## 2015-11-11 MED ORDER — OXYCODONE-ACETAMINOPHEN 5-325 MG PO TABS: 1.0000 | ORAL_TABLET | Freq: Four times a day (QID) | ORAL | Status: DC | PRN

## 2015-11-11 NOTE — ED Notes (Signed)
Discussed discharge instructions, prescriptions, and follow-up care with patient. No questions or concerns at this time. Pt stable at discharge.  

## 2015-11-11 NOTE — ED Notes (Signed)
MD at bedside. 

## 2015-11-11 NOTE — ED Notes (Addendum)
Pt brought in to [facility] c/o left arm pain and fall that happened today at her house Pt unsure how long she was down for. States that she had a mechanical fall, denies LOC. Left arm splinted on arrival. Pulses palpable and strong. PT c/o arm pain above the elbow closest to the shoulder. Pt alert and oriented X4, active, cooperative, pt in NAD. RR even and unlabored, color WNL.  EMS unable to establish IV.

## 2015-11-11 NOTE — ED Notes (Signed)
Patient transported to X-ray 

## 2015-11-11 NOTE — ED Notes (Signed)
Report to Michele, RN

## 2015-11-11 NOTE — ED Provider Notes (Signed)
[facility] Emergency Department Provider Note  ____________________________________________  Time seen: 5:50 PM  I have reviewed the triage vital signs and the nursing notes.   HISTORY  Chief Complaint Fall and Arm Pain    HPI Marilyn Oconnell is a 82 y.o. female who was walking outside her home today when she tripped and fell onto her left arm. She complains of pain in the proximal left arm near the shoulder. Difficulty with moving the arm. Denies any back pain neck pain or head injury. No loss of consciousness. Denies syncope. No other symptoms.     History reviewed. No pertinent past medical history.   Patient Active Problem List   Diagnosis Date Noted  . Adaptation reaction 09/28/2015  . Aortic heart valve narrowing 09/28/2015  . Benign hypertension 09/28/2015  . Bilateral cataracts 09/28/2015  . Bronchitis with chronic airway obstruction ([place]) 09/28/2015  . Cardiac murmur 09/28/2015  . Chronic kidney disease (CKD), stage III (moderate) 09/28/2015  . Clinical depression 09/28/2015  . Gout 09/28/2015  . 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/2015  . OP (osteoporosis) 09/28/2015  . Subclinical hypothyroidism 09/28/2015  . Atherosclerosis of coronary artery 09/08/2015  . CAFL (chronic airflow limitation) ([place]) 09/08/2015  . Avitaminosis D 07/11/2015  . LBP (low back pain) 07/11/2015  . BP (high blood pressure) 07/11/2015  . Hyperlipemia 04/12/2015  . Billowing mitral valve 08/03/2014  . Arteriosclerosis of coronary artery 08/03/2014  . Fibrosis of lung ([place]) 05/25/2014  . Asthma-chronic obstructive pulmonary disease overlap syndrome ([place]) 05/25/2014  . H/O aortic valve replacement 11/09/2004     Past Surgical History  Procedure Laterality Date  . Shoulder surgery  09/1999  . Sigmoidoscopy  1992  . Dilation and curettage of uterus  1988  . Aortic valve replacement  2006     Current Outpatient Rx  Name  Route  Sig  Dispense  Refill  . ADVAIR  DISKUS 250-50 MCG/DOSE AEPB      USE 1 INHALATION BY MOUTH EVERY 12 HOURS. <<RINSE MOUTH AFTER USE>>.   180 each   1   . albuterol (ACCUNEB) 0.63 MG/3ML nebulizer solution   Inhalation   Inhale into the lungs.         Marland Kitchen aspirin 81 MG tablet   Oral   Take by mouth.         . Cholecalciferol 1000 units capsule   Oral   Take by mouth.         . escitalopram (LEXAPRO) 10 MG tablet   Oral   Take by mouth.         . metoprolol (LOPRESSOR) 100 MG tablet      TAKE 1 TABLET BY MOUTH TWICE DAILY   180 tablet   3   . ondansetron (ZOFRAN ODT) 4 MG disintegrating tablet   Oral   Take 1 tablet (4 mg total) by mouth every 8 (eight) hours as needed for nausea or vomiting.   20 tablet   0   . oxyCODONE-acetaminophen (ROXICET) 5-325 MG tablet   Oral   Take 1 tablet by mouth every 6 (six) hours as needed for severe pain.   12 tablet   0   . simvastatin (ZOCOR) 20 MG tablet      TAKE ONE TABLET BY MOUTH AT BEDTIME.   90 tablet   3   . spironolactone (ALDACTONE) 25 MG tablet   Oral   Take 1 tablet (25 mg total) by mouth daily.   90 tablet   3  Allergies Azithromycin; Iodine; Shellfish allergy; Sulfa antibiotics; and Sulfamethoxazole-trimethoprim   Family History  Problem Relation Age of Onset  . Aneurysm Father   . Lung cancer Brother   . Prostate cancer Brother   . Heart failure Brother     Social History Social History  Substance Use Topics  . Smoking status: Never Smoker   . Smokeless tobacco: Never Used  . Alcohol Use: No    Review of Systems  Constitutional:   No fever or chills. No weight changes Eyes:   No blurry vision or double vision.  ENT:   No sore throat.  Cardiovascular:   No chest pain. Respiratory:   No dyspnea or cough. Gastrointestinal:   Negative for abdominal pain, vomiting and diarrhea.  No BRBPR or melena. Genitourinary:   Negative for dysuria or difficulty urinating. Musculoskeletal:  Left Shoulder pain as above Skin:    Negative for rash. Neurological:   Negative for headaches, focal weakness or numbness. Psychiatric:  No anxiety or depression.   Endocrine:  No changes in energy or sleep difficulty.  10-point ROS otherwise negative.  ____________________________________________   PHYSICAL EXAM:  VITAL SIGNS: ED Triage Vitals  Enc Vitals Group     BP 11/11/15 1737 181/103 mmHg     Pulse Rate 11/11/15 1737 72     Resp 11/11/15 1737 22     Temp 11/11/15 1737 98.1 F (36.7 C)     Temp Source 11/11/15 1737 Oral     SpO2 11/11/15 1737 97 %     Weight 11/11/15 1737 136 lb (61.689 kg)     Height 11/11/15 1737 5\' 5"  (1.651 m)     Head Cir --      Peak Flow --      Pain Score 11/11/15 1738 10     Pain Loc --      Pain Edu? --      Excl. in [place]? --     Vital signs reviewed, nursing assessments reviewed.   Constitutional:   Alert and oriented. Uncomfortable with shoulder pain Eyes:   No scleral icterus. No conjunctival pallor. PERRL. EOMI ENT   Head:   Normocephalic and atraumatic.   Nose:   No congestion/rhinnorhea. No septal hematoma   Mouth/Throat:   MMM, no pharyngeal erythema. No peritonsillar mass.    Neck:   No stridor. No SubQ emphysema. No meningismus. No C-spine tenderness, full range of motion Hematological/Lymphatic/Immunilogical:   No cervical lymphadenopathy. Cardiovascular:   RRR. Symmetric bilateral radial and DP pulses.  No murmurs.  Respiratory:   Normal respiratory effort without tachypnea nor retractions. Breath sounds are clear and equal bilaterally. No wheezes/rales/rhonchi. Gastrointestinal:   Soft and nontender. Non distended. There is no CVA tenderness.  No rebound, rigidity, or guarding. Genitourinary:   deferred Musculoskeletal:  Severe tenderness of the proximal humerus. Clavicle and acromion are stable. No midline spinal tenderness. Chest wall stable. Pelvis stable. No hip tenderness. Full range of motion in the lower extremities. Right arm is unaffected.  Distal left humerus, left elbow and forearm and hand are unremarkable. Full range of motion in the elbow, nontender at the epicondyles.. Neurologic:   Normal speech and language.  CN 2-10 normal. Motor grossly intact. No gross focal neurologic deficits are appreciated.  Skin:    Skin is warm, dry and intact. No rash noted.  No petechiae, purpura, or bullae. Psychiatric:   Mood and affect are normal. No SI or hallucinations ____________________________________________    LABS (pertinent positives/negatives) (all labs ordered are  listed, but only abnormal results are displayed) Labs Reviewed - No data to display ____________________________________________   EKG    ____________________________________________    RADIOLOGY  X-ray left humerus shows impacted proximal left humerus fracture. There is question will hairline fracture at the lateral epicondyles.  ____________________________________________   PROCEDURES   ____________________________________________   INITIAL IMPRESSION / ASSESSMENT AND PLAN / ED COURSE  Pertinent labs & imaging results that were available during my care of the patient were reviewed by me and considered in my medical decision making (see chart for details).  Patient presents with proximal left humerus pain after a fall. X-ray confirms a humerus fracture. No other shoulder injury at this time. Clinically she does not have an elbow fracture. Patient placed in a sling. Film reviewed by orthopedics Dr. Sabra Heck who agrees with nonoperative management course for now, patient will follow up within 1 week.     ____________________________________________   FINAL CLINICAL IMPRESSION(S) / ED DIAGNOSES  Final diagnoses:  Proximal humerus fracture, left, closed, initial encounter      Carrie Mew, MD 11/11/15 1926

## 2015-11-11 NOTE — Discharge Instructions (Signed)
Humerus Fracture Treated With Immobilization °The humerus is the large bone in your upper arm. You have a broken (fractured) humerus. These fractures are easily diagnosed with X-rays. °TREATMENT  °Simple fractures which will heal without disability are treated with simple immobilization. Immobilization means you will wear a cast, splint, or sling. You have a fracture which will do well with immobilization. The fracture will heal well simply by being held in a good position until it is stable enough to begin range of motion exercises. Do not take part in activities which would further injure your arm.  °HOME CARE INSTRUCTIONS  °· Put ice on the injured area. °¨ Put ice in a plastic bag. °¨ Place a towel between your skin and the bag. °¨ Leave the ice on for 15-20 minutes, 03-04 times a day. °· If you have a cast: °¨ Do not scratch the skin under the cast using sharp or pointed objects. °¨ Check the skin around the cast every day. You may put lotion on any red or sore areas. °¨ Keep your cast dry and clean. °· If you have a splint: °¨ Wear the splint as directed. °¨ Keep your splint dry and clean. °¨ You may loosen the elastic around the splint if your fingers become numb, tingle, or turn cold or blue. °· If you have a sling: °¨ Wear the sling as directed. °· Do not put pressure on any part of your cast or splint until it is fully hardened. °· Your cast or splint can be protected during bathing with a plastic bag. Do not lower the cast or splint into water. °· Only take over-the-counter or prescription medicines for pain, discomfort, or fever as directed by your caregiver. °· Do range of motion exercises as instructed by your caregiver. °· Follow up as directed by your caregiver. This is very important in order to avoid permanent injury or disability and chronic pain. °SEEK IMMEDIATE MEDICAL CARE IF:  °· Your skin or nails in the injured arm turn blue or gray. °· Your arm feels cold or numb. °· You develop severe pain  in the injured arm. °· You are having problems with the medicines you were given. °MAKE SURE YOU:  °· Understand these instructions. °· Will watch your condition. °· Will get help right away if you are not doing well or get worse. °  °This information is not intended to replace advice given to you by your health care provider. Make sure you discuss any questions you have with your health care provider. °  °Document Released: 12/16/2000 Document Revised: 09/30/2014 Document Reviewed: 02/01/2015 °Elsevier Interactive Patient Education ©2016 [employer] ° °

## 2015-11-14 ENCOUNTER — Other Ambulatory Visit: Payer: Self-pay | Admitting: Family Medicine

## 2015-11-14 MED ORDER — OXYCODONE-ACETAMINOPHEN 5-325 MG PO TABS: 1.0000 | ORAL_TABLET | Freq: Four times a day (QID) | ORAL | Status: DC | PRN

## 2015-11-16 ENCOUNTER — Telehealth: Payer: Self-pay | Admitting: Family Medicine

## 2015-11-16 DIAGNOSIS — N183 Chronic kidney disease, stage 3 unspecified: Secondary | ICD-10-CM

## 2015-11-16 NOTE — Telephone Encounter (Signed)
Needs labs rechecked. Lab printed.  Thanks.

## 2015-11-17 ENCOUNTER — Other Ambulatory Visit: Payer: Self-pay | Admitting: Family Medicine

## 2015-11-17 DIAGNOSIS — S42291A Other displaced fracture of upper end of right humerus, initial encounter for closed fracture: Secondary | ICD-10-CM | POA: Diagnosis not present

## 2015-11-17 DIAGNOSIS — M109 Gout, unspecified: Secondary | ICD-10-CM

## 2015-11-17 DIAGNOSIS — N183 Chronic kidney disease, stage 3 unspecified: Secondary | ICD-10-CM

## 2015-11-17 NOTE — Telephone Encounter (Signed)
Patient seeing things at night.  Is distressing to her now.   Getting worse.

## 2015-11-18 LAB — COMPREHENSIVE METABOLIC PANEL
ALT: 11 IU/L (ref 0–32)
AST: 11 IU/L (ref 0–40)
Albumin/Globulin Ratio: 1.7 (ref 1.1–2.5)
Albumin: 4 g/dL (ref 3.5–4.7)
Alkaline Phosphatase: 76 IU/L (ref 39–117)
BUN/Creatinine Ratio: 27 — ABNORMAL HIGH (ref 11–26)
BUN: 44 mg/dL — ABNORMAL HIGH (ref 8–27)
Bilirubin Total: 0.4 mg/dL (ref 0.0–1.2)
CO2: 20 mmol/L (ref 18–29)
Calcium: 9.4 mg/dL (ref 8.7–10.3)
Chloride: 101 mmol/L (ref 96–106)
Creatinine, Ser: 1.65 mg/dL — ABNORMAL HIGH (ref 0.57–1.00)
GFR calc Af Amer: 32 mL/min/{1.73_m2} — ABNORMAL LOW (ref >59)
GFR calc non Af Amer: 27 mL/min/{1.73_m2} — ABNORMAL LOW (ref >59)
Globulin, Total: 2.3 g/dL (ref 1.5–4.5)
Glucose: 85 mg/dL (ref 65–99)
Potassium: 5.7 mmol/L — ABNORMAL HIGH (ref 3.5–5.2)
Sodium: 140 mmol/L (ref 134–144)
Total Protein: 6.3 g/dL (ref 6.0–8.5)

## 2015-11-18 LAB — URIC ACID: Uric Acid: 9.2 mg/dL — ABNORMAL HIGH (ref 2.5–7.1)

## 2015-11-18 MED ORDER — FUROSEMIDE 20 MG PO TABS: 20.0000 mg | ORAL_TABLET | Freq: Every day | ORAL | Status: DC

## 2015-11-18 MED ORDER — COLCHICINE 0.6 MG PO TABS: 0.6000 mg | ORAL_TABLET | Freq: Every day | ORAL | Status: DC

## 2015-11-18 NOTE — Telephone Encounter (Signed)
Changes based on labs. Will stop Simvastatin And spironolactone. Colcys and lasix for now. Talked with niece. Thanks.

## 2015-11-20 ENCOUNTER — Telehealth: Payer: Self-pay | Admitting: Family Medicine

## 2015-11-20 NOTE — Telephone Encounter (Signed)
Ok to put in same day. Thanks.

## 2015-11-20 NOTE — Telephone Encounter (Signed)
Spoke with Sharyn Lull and rescheduled appt for tomorrow 11/21/15 @ 10:45. Thanks TNP

## 2015-11-20 NOTE — Telephone Encounter (Signed)
Pt's niece Sharyn Lull would like to get pt worked into [facility]. Sharyn Lull went ahead and scheduled pt to come in Thursday @ 11:30 which was the first open slot in Dr. Sharyon Medicus schedule. Sharyn Lull has to arrange transportation for pt and she wanted to go ahead and see if pt could be worked in for tomorrow in a same day slot. Pt has been waking up for the last 2 nights with SOB. Pt has told Sharyn Lull it doesn't last long but it does wake pt up. Please advise. Thanks TNP

## 2015-11-21 ENCOUNTER — Encounter: Payer: Self-pay | Admitting: Family Medicine

## 2015-11-21 ENCOUNTER — Ambulatory Visit (INDEPENDENT_AMBULATORY_CARE_PROVIDER_SITE_OTHER): Payer: Medicare Other | Admitting: Family Medicine

## 2015-11-21 VITALS — BP 110/70 | HR 64 | Temp 97.6°F | Resp 16 | Wt 135.0 lb

## 2015-11-21 DIAGNOSIS — S42309A Unspecified fracture of shaft of humerus, unspecified arm, initial encounter for closed fracture: Secondary | ICD-10-CM | POA: Insufficient documentation

## 2015-11-21 DIAGNOSIS — M109 Gout, unspecified: Secondary | ICD-10-CM

## 2015-11-21 DIAGNOSIS — N183 Chronic kidney disease, stage 3 unspecified: Secondary | ICD-10-CM

## 2015-11-21 DIAGNOSIS — S42302D Unspecified fracture of shaft of humerus, left arm, subsequent encounter for fracture with routine healing: Secondary | ICD-10-CM | POA: Diagnosis not present

## 2015-11-21 DIAGNOSIS — R0602 Shortness of breath: Secondary | ICD-10-CM

## 2015-11-21 DIAGNOSIS — J449 Chronic obstructive pulmonary disease, unspecified: Secondary | ICD-10-CM | POA: Diagnosis not present

## 2015-11-21 NOTE — Progress Notes (Signed)
Subjective:    Patient ID: Marilyn Oconnell, female    DOB: September 03, 1924, 82 y.o.   MRN: [id number]  Breathing Problem She complains of chest tightness, difficulty breathing, frequent throat clearing and shortness of breath. There is no cough, hemoptysis, hoarse voice, sputum production or wheezing. This is a recurrent problem. The problem has been gradually worsening (worsening since fracturing arm). Associated symptoms include headaches, malaise/fatigue and orthopnea. Pertinent negatives include no chest pain or dyspnea on exertion. Exacerbated by: laying down. Her symptoms are alleviated by nothing (pt report she is taking Albuterol, but is not taking Advair since arm fx). Her past medical history is significant for asthma and COPD.  Pt is currently sleeping in a recliner, sitting straight up. Pt states, "when I start to drift off, I lose my breath". Pt also experiencing hallucinations. Does have trouble getting in an and out of recliner.   Needs her frail husband's help. Did try wedge in the bed and this did not help.  Gout improved on Colcrys. Now with abdominal pain.   Also, no leg swelling on Lasix.    Review of Systems  Constitutional: Positive for malaise/fatigue.  HENT: Negative for hoarse voice.   Respiratory: Positive for shortness of breath. Negative for cough, hemoptysis, sputum production and wheezing.   Cardiovascular: Negative for chest pain and dyspnea on exertion.  Neurological: Positive for headaches.   BP 110/70 mmHg  Pulse 64  Temp(Src) 97.6 F (36.4 C) (Oral)  Resp 16  Wt 135 lb (61.236 kg)  SpO2 97%   Patient Active Problem List   Diagnosis Date Noted  . Adaptation reaction 09/28/2015  . Aortic heart valve narrowing 09/28/2015  . Benign hypertension 09/28/2015  . Bilateral cataracts 09/28/2015  . Bronchitis with chronic airway obstruction ([place]) 09/28/2015  . Cardiac murmur 09/28/2015  . Chronic kidney disease (CKD), stage III (moderate) 09/28/2015  . Clinical  depression 09/28/2015  . Gout 09/28/2015  . 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/2015  . OP (osteoporosis) 09/28/2015  . Subclinical hypothyroidism 09/28/2015  . Atherosclerosis of coronary artery 09/08/2015  . CAFL (chronic airflow limitation) ([place]) 09/08/2015  . Avitaminosis D 07/11/2015  . LBP (low back pain) 07/11/2015  . BP (high blood pressure) 07/11/2015  . Hyperlipemia 04/12/2015  . Billowing mitral valve 08/03/2014  . Arteriosclerosis of coronary artery 08/03/2014  . Fibrosis of lung ([place]) 05/25/2014  . Asthma-chronic obstructive pulmonary disease overlap syndrome ([place]) 05/25/2014  . H/O aortic valve replacement 11/09/2004   No past medical history on file. Current Outpatient Prescriptions on File Prior to Visit  Medication Sig  . albuterol (ACCUNEB) 0.63 MG/3ML nebulizer solution Inhale into the lungs.  . ADVAIR DISKUS 250-50 MCG/DOSE AEPB USE 1 INHALATION BY MOUTH EVERY 12 HOURS. <<RINSE MOUTH AFTER USE>>. (Patient not taking: Reported on 11/21/2015)  . aspirin 81 MG tablet Take by mouth.  . Cholecalciferol 1000 units capsule Take by mouth. Reported on 11/21/2015  . colchicine (COLCRYS) 0.6 MG tablet Take 1 tablet (0.6 mg total) by mouth daily. Two times a day today and then one a day  . furosemide (LASIX) 20 MG tablet Take 1 tablet (20 mg total) by mouth daily.  . metoprolol (LOPRESSOR) 100 MG tablet TAKE 1 TABLET BY MOUTH TWICE DAILY  . ondansetron (ZOFRAN ODT) 4 MG disintegrating tablet Take 1 tablet (4 mg total) by mouth every 8 (eight) hours as needed for nausea or vomiting.   No current facility-administered medications on file prior to visit.   Allergies  Allergen Reactions  .  Azithromycin   . Iodine   . Shellfish Allergy   . Sulfa Antibiotics   . Sulfamethoxazole-Trimethoprim    Past Surgical History  Procedure Laterality Date  . Shoulder surgery  09/1999  . Sigmoidoscopy  1992  . Dilation and curettage of uterus  1988  . Aortic valve replacement  2006   Social  History   Social History  . Marital Status: Married    Spouse Name: N/A  . Number of Children: N/A  . Years of Education: grade 8   Occupational History  . Not on file.   Social History Main Topics  . Smoking status: Never Smoker   . Smokeless tobacco: Never Used  . Alcohol Use: No  . Drug Use: No  . Sexual Activity: Not on file   Other Topics Concern  . Not on file   Social History Narrative   Family History  Problem Relation Age of Onset  . Aneurysm Father   . Lung cancer Brother   . Prostate cancer Brother   . Heart failure Brother       Objective:   Physical Exam  Constitutional: She appears well-developed and well-nourished.  Cardiovascular: Normal rate, regular rhythm and normal heart sounds.   Pulmonary/Chest: Effort normal.  Slight crackles at lung bases  Musculoskeletal:  Left arm in brace.  Psychiatric: She has a normal mood and affect. Her behavior is normal.  BP 110/70 mmHg  Pulse 64  Temp(Src) 97.6 F (36.4 C) (Oral)  Resp 16  Wt 135 lb (61.236 kg)  SpO2 97%     Assessment & Plan:  1. Shortness of breath Suspect positional related. Having trouble with sleeping in recliner. Can not sleep in bed.  Suspect cause of her SOB.  Will check labs.   Will also write for lift chair,  Which will help with immobility, SOB, CHF and arm fracture.  Further plan pending these results.  - oxyCODONE-acetaminophen (PERCOCET/ROXICET) 5-325 MG tablet; Take by mouth every 4 (four) hours as needed for severe pain. - Comprehensive metabolic panel  2. Asthma-chronic obstructive pulmonary disease overlap syndrome ([facility]) Continue albuterol. Restart Advair when able.   3. Chronic kidney disease (CKD), stage III (moderate) Recheck labs on Lasix today.   4. Gout, unspecified cause, unspecified chronicity, unspecified site Improved. Stop Colcrys. Consider allopurinol to decrease uric acid when more stable.    5. Humerus fracture, left, with routine healing, subsequent  encounter Plan as above, including lift chair.  - CBC with Differential/Platelet - Brain natriuretic peptide   Patient was seen and examined by Jerrell Belfast, MD, and note scribed by Renaldo Fiddler, CMA. I have reviewed the document for accuracy and completeness and I agree with above. Jerrell Belfast, MD   Margarita Rana, MD

## 2015-11-22 DIAGNOSIS — R0602 Shortness of breath: Secondary | ICD-10-CM | POA: Diagnosis not present

## 2015-11-23 ENCOUNTER — Ambulatory Visit: Payer: Medicare Other | Admitting: Family Medicine

## 2015-11-23 LAB — CBC WITH DIFFERENTIAL/PLATELET
Basophils Absolute: 0 10*3/uL (ref 0.0–0.2)
Basos: 0 %
EOS (ABSOLUTE): 0.3 10*3/uL (ref 0.0–0.4)
Eos: 4 %
Hematocrit: 31.9 % — ABNORMAL LOW (ref 34.0–46.6)
Hemoglobin: 10.1 g/dL — ABNORMAL LOW (ref 11.1–15.9)
Immature Grans (Abs): 0 10*3/uL (ref 0.0–0.1)
Immature Granulocytes: 0 %
Lymphocytes Absolute: 1.1 10*3/uL (ref 0.7–3.1)
Lymphs: 13 %
MCH: 29.6 pg (ref 26.6–33.0)
MCHC: 31.7 g/dL (ref 31.5–35.7)
MCV: 94 fL (ref 79–97)
Monocytes Absolute: 0.8 10*3/uL (ref 0.1–0.9)
Monocytes: 9 %
Neutrophils Absolute: 6 10*3/uL (ref 1.4–7.0)
Neutrophils: 74 %
Platelets: 323 10*3/uL (ref 150–379)
RBC: 3.41 x10E6/uL — ABNORMAL LOW (ref 3.77–5.28)
RDW: 14.2 % (ref 12.3–15.4)
WBC: 8.2 10*3/uL (ref 3.4–10.8)

## 2015-11-23 LAB — COMPREHENSIVE METABOLIC PANEL
ALT: 44 IU/L — ABNORMAL HIGH (ref 0–32)
AST: 49 IU/L — ABNORMAL HIGH (ref 0–40)
Albumin/Globulin Ratio: 1.4 (ref 1.1–2.5)
Albumin: 3.7 g/dL (ref 3.5–4.7)
Alkaline Phosphatase: 105 IU/L (ref 39–117)
BUN/Creatinine Ratio: 24 (ref 11–26)
BUN: 32 mg/dL — ABNORMAL HIGH (ref 8–27)
Bilirubin Total: 0.3 mg/dL (ref 0.0–1.2)
CO2: 20 mmol/L (ref 18–29)
Calcium: 9.1 mg/dL (ref 8.7–10.3)
Chloride: 99 mmol/L (ref 96–106)
Creatinine, Ser: 1.34 mg/dL — ABNORMAL HIGH (ref 0.57–1.00)
GFR calc Af Amer: 41 mL/min/{1.73_m2} — ABNORMAL LOW (ref >59)
GFR calc non Af Amer: 35 mL/min/{1.73_m2} — ABNORMAL LOW (ref >59)
Globulin, Total: 2.6 g/dL (ref 1.5–4.5)
Glucose: 149 mg/dL — ABNORMAL HIGH (ref 65–99)
Potassium: 4.5 mmol/L (ref 3.5–5.2)
Sodium: 139 mmol/L (ref 134–144)
Total Protein: 6.3 g/dL (ref 6.0–8.5)

## 2015-11-23 LAB — BRAIN NATRIURETIC PEPTIDE: BNP: 245.6 pg/mL — ABNORMAL HIGH (ref 0.0–100.0)

## 2015-11-27 ENCOUNTER — Encounter: Payer: Self-pay | Admitting: Emergency Medicine

## 2015-11-27 ENCOUNTER — Emergency Department: Payer: Medicare Other

## 2015-11-27 ENCOUNTER — Ambulatory Visit: Payer: Medicare Other | Admitting: Family Medicine

## 2015-11-27 ENCOUNTER — Observation Stay
Admission: EM | Admit: 2015-11-27 | Discharge: 2015-11-28 | Disposition: A | Discharge status: Home / Self Care | Payer: Medicare Other | Attending: Internal Medicine | Admitting: Internal Medicine

## 2015-11-27 DIAGNOSIS — R0789 Other chest pain: Secondary | ICD-10-CM | POA: Diagnosis not present

## 2015-11-27 DIAGNOSIS — M545 Low back pain: Secondary | ICD-10-CM | POA: Diagnosis not present

## 2015-11-27 DIAGNOSIS — M1990 Unspecified osteoarthritis, unspecified site: Secondary | ICD-10-CM | POA: Diagnosis not present

## 2015-11-27 DIAGNOSIS — I509 Heart failure, unspecified: Secondary | ICD-10-CM | POA: Insufficient documentation

## 2015-11-27 DIAGNOSIS — Z881 Allergy status to other antibiotic agents status: Secondary | ICD-10-CM | POA: Diagnosis not present

## 2015-11-27 DIAGNOSIS — E039 Hypothyroidism, unspecified: Secondary | ICD-10-CM | POA: Insufficient documentation

## 2015-11-27 DIAGNOSIS — Z91013 Allergy to seafood: Secondary | ICD-10-CM | POA: Diagnosis not present

## 2015-11-27 DIAGNOSIS — Z96611 Presence of right artificial shoulder joint: Secondary | ICD-10-CM | POA: Insufficient documentation

## 2015-11-27 DIAGNOSIS — X58XXXD Exposure to other specified factors, subsequent encounter: Secondary | ICD-10-CM | POA: Insufficient documentation

## 2015-11-27 DIAGNOSIS — Z8042 Family history of malignant neoplasm of prostate: Secondary | ICD-10-CM | POA: Diagnosis not present

## 2015-11-27 DIAGNOSIS — J8410 Pulmonary fibrosis, unspecified: Secondary | ICD-10-CM | POA: Diagnosis not present

## 2015-11-27 DIAGNOSIS — Z91041 Radiographic dye allergy status: Secondary | ICD-10-CM | POA: Insufficient documentation

## 2015-11-27 DIAGNOSIS — F32A Depression, unspecified: Secondary | ICD-10-CM

## 2015-11-27 DIAGNOSIS — J449 Chronic obstructive pulmonary disease, unspecified: Secondary | ICD-10-CM | POA: Diagnosis not present

## 2015-11-27 DIAGNOSIS — I081 Rheumatic disorders of both mitral and tricuspid valves: Secondary | ICD-10-CM | POA: Diagnosis not present

## 2015-11-27 DIAGNOSIS — S42302A Unspecified fracture of shaft of humerus, left arm, initial encounter for closed fracture: Secondary | ICD-10-CM

## 2015-11-27 DIAGNOSIS — Z801 Family history of malignant neoplasm of trachea, bronchus and lung: Secondary | ICD-10-CM | POA: Insufficient documentation

## 2015-11-27 DIAGNOSIS — R778 Other specified abnormalities of plasma proteins: Secondary | ICD-10-CM | POA: Diagnosis not present

## 2015-11-27 DIAGNOSIS — N183 Chronic kidney disease, stage 3 (moderate): Secondary | ICD-10-CM | POA: Diagnosis not present

## 2015-11-27 DIAGNOSIS — E7800 Pure hypercholesterolemia, unspecified: Secondary | ICD-10-CM | POA: Diagnosis not present

## 2015-11-27 DIAGNOSIS — I2510 Atherosclerotic heart disease of native coronary artery without angina pectoris: Secondary | ICD-10-CM | POA: Diagnosis not present

## 2015-11-27 DIAGNOSIS — Z79899 Other long term (current) drug therapy: Secondary | ICD-10-CM | POA: Diagnosis not present

## 2015-11-27 DIAGNOSIS — R011 Cardiac murmur, unspecified: Secondary | ICD-10-CM | POA: Diagnosis not present

## 2015-11-27 DIAGNOSIS — Z7982 Long term (current) use of aspirin: Secondary | ICD-10-CM | POA: Diagnosis not present

## 2015-11-27 DIAGNOSIS — E559 Vitamin D deficiency, unspecified: Secondary | ICD-10-CM | POA: Insufficient documentation

## 2015-11-27 DIAGNOSIS — Z888 Allergy status to other drugs, medicaments and biological substances status: Secondary | ICD-10-CM | POA: Diagnosis not present

## 2015-11-27 DIAGNOSIS — I248 Other forms of acute ischemic heart disease: Secondary | ICD-10-CM | POA: Insufficient documentation

## 2015-11-27 DIAGNOSIS — R079 Chest pain, unspecified: Secondary | ICD-10-CM | POA: Diagnosis not present

## 2015-11-27 DIAGNOSIS — S42202D Unspecified fracture of upper end of left humerus, subsequent encounter for fracture with routine healing: Secondary | ICD-10-CM | POA: Insufficient documentation

## 2015-11-27 DIAGNOSIS — M109 Gout, unspecified: Secondary | ICD-10-CM | POA: Insufficient documentation

## 2015-11-27 DIAGNOSIS — Z882 Allergy status to sulfonamides status: Secondary | ICD-10-CM | POA: Insufficient documentation

## 2015-11-27 DIAGNOSIS — Z952 Presence of prosthetic heart valve: Secondary | ICD-10-CM | POA: Insufficient documentation

## 2015-11-27 DIAGNOSIS — I350 Nonrheumatic aortic (valve) stenosis: Secondary | ICD-10-CM | POA: Diagnosis not present

## 2015-11-27 DIAGNOSIS — I4891 Unspecified atrial fibrillation: Secondary | ICD-10-CM | POA: Diagnosis not present

## 2015-11-27 DIAGNOSIS — R0600 Dyspnea, unspecified: Secondary | ICD-10-CM

## 2015-11-27 DIAGNOSIS — F329 Major depressive disorder, single episode, unspecified: Secondary | ICD-10-CM | POA: Diagnosis not present

## 2015-11-27 DIAGNOSIS — R7989 Other specified abnormal findings of blood chemistry: Secondary | ICD-10-CM

## 2015-11-27 DIAGNOSIS — I130 Hypertensive heart and chronic kidney disease with heart failure and stage 1 through stage 4 chronic kidney disease, or unspecified chronic kidney disease: Secondary | ICD-10-CM | POA: Insufficient documentation

## 2015-11-27 DIAGNOSIS — H3530 Unspecified macular degeneration: Secondary | ICD-10-CM | POA: Diagnosis not present

## 2015-11-27 DIAGNOSIS — I480 Paroxysmal atrial fibrillation: Secondary | ICD-10-CM | POA: Diagnosis not present

## 2015-11-27 DIAGNOSIS — K5900 Constipation, unspecified: Secondary | ICD-10-CM | POA: Diagnosis not present

## 2015-11-27 DIAGNOSIS — Z8249 Family history of ischemic heart disease and other diseases of the circulatory system: Secondary | ICD-10-CM | POA: Diagnosis not present

## 2015-11-27 HISTORY — DX: Pure hypercholesterolemia, unspecified: E78.00

## 2015-11-27 HISTORY — DX: Atherosclerotic heart disease of native coronary artery without angina pectoris: I25.10

## 2015-11-27 HISTORY — DX: Unspecified osteoarthritis, unspecified site: M19.90

## 2015-11-27 HISTORY — DX: Chronic obstructive pulmonary disease, unspecified: J44.9

## 2015-11-27 HISTORY — DX: Heart failure, unspecified: I50.9

## 2015-11-27 HISTORY — DX: Essential (primary) hypertension: I10

## 2015-11-27 LAB — COMPREHENSIVE METABOLIC PANEL
ALT: 32 U/L (ref 14–54)
AST: 25 U/L (ref 15–41)
Albumin: 3.4 g/dL — ABNORMAL LOW (ref 3.5–5.0)
Alkaline Phosphatase: 99 U/L (ref 38–126)
Anion gap: 10 (ref 5–15)
BUN: 38 mg/dL — ABNORMAL HIGH (ref 6–20)
CO2: 25 mmol/L (ref 22–32)
Calcium: 9 mg/dL (ref 8.9–10.3)
Chloride: 104 mmol/L (ref 101–111)
Creatinine, Ser: 1.45 mg/dL — ABNORMAL HIGH (ref 0.44–1.00)
GFR calc Af Amer: 36 mL/min — ABNORMAL LOW (ref >60)
GFR calc non Af Amer: 31 mL/min — ABNORMAL LOW (ref >60)
Glucose, Bld: 140 mg/dL — ABNORMAL HIGH (ref 65–99)
Potassium: 3.8 mmol/L (ref 3.5–5.1)
Sodium: 139 mmol/L (ref 135–145)
Total Bilirubin: 0.8 mg/dL (ref 0.3–1.2)
Total Protein: 6.6 g/dL (ref 6.5–8.1)

## 2015-11-27 LAB — FIBRIN DERIVATIVES D-DIMER (ARMC ONLY): Fibrin derivatives D-dimer (ARMC): 3416 — ABNORMAL HIGH (ref 0–499)

## 2015-11-27 LAB — PROTIME-INR
INR: 1.07
Prothrombin Time: 14.1 seconds (ref 11.4–15.0)

## 2015-11-27 LAB — LIPASE, BLOOD: Lipase: 25 U/L (ref 11–51)

## 2015-11-27 LAB — TROPONIN I
Troponin I: <0.03 ng/mL (ref <0.031)
Troponin I: 0.07 ng/mL — ABNORMAL HIGH (ref <0.031)

## 2015-11-27 IMAGING — CR DG CHEST 2V
2 series · 3 of 3 positions shown · non-contrast
Comparison: [DATE] radiographs.  CT [DATE].

CLINICAL DATA: Chest pain. History of COPD and aortic valve
replacement.

EXAM:
CHEST  2 VIEW

[Series 2: chest lat · 0.14mm/px · 2 of 2 slices shown]
[im 1/2]
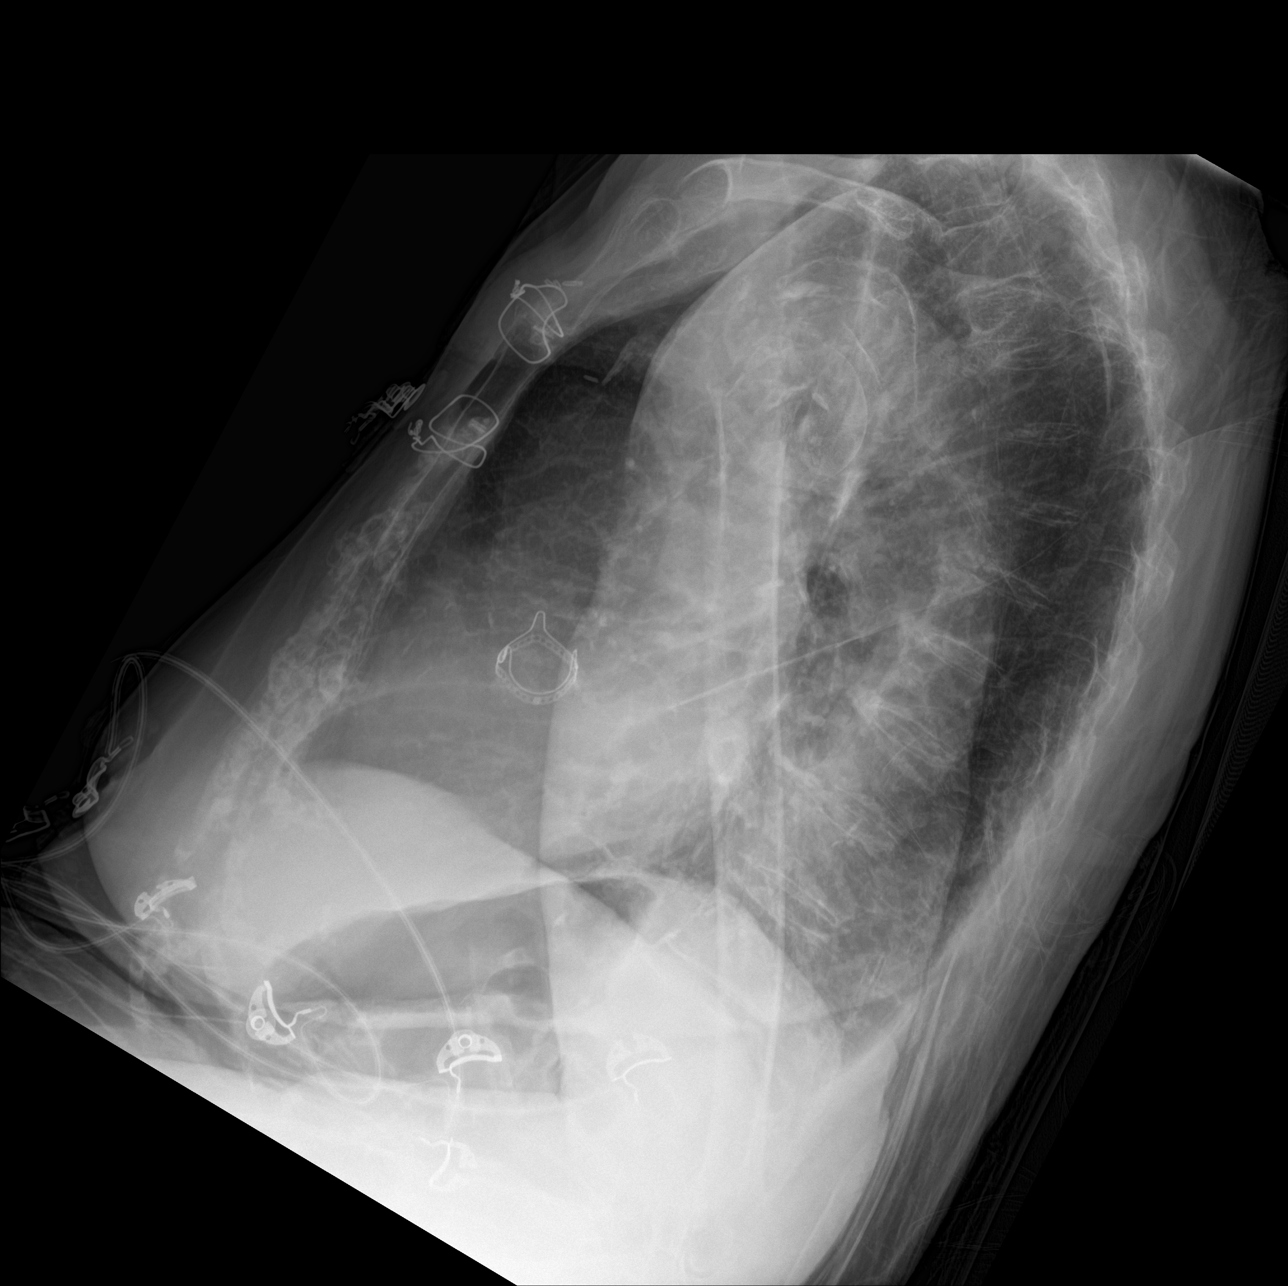
[im 2/2]
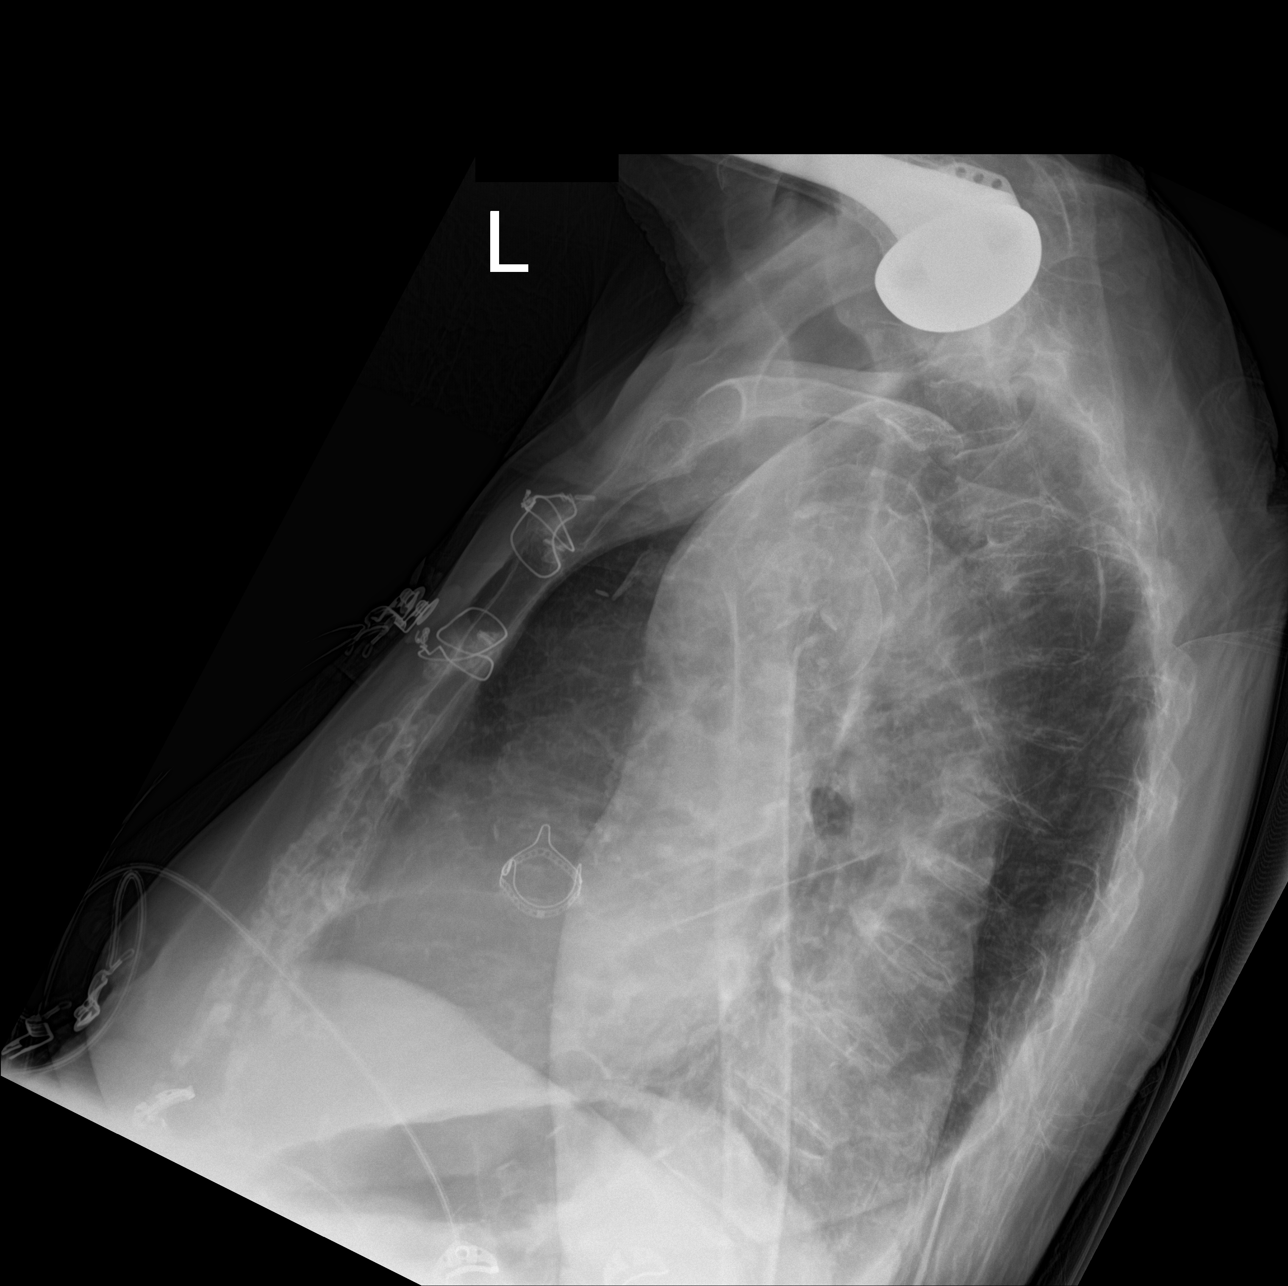

[chest ap]
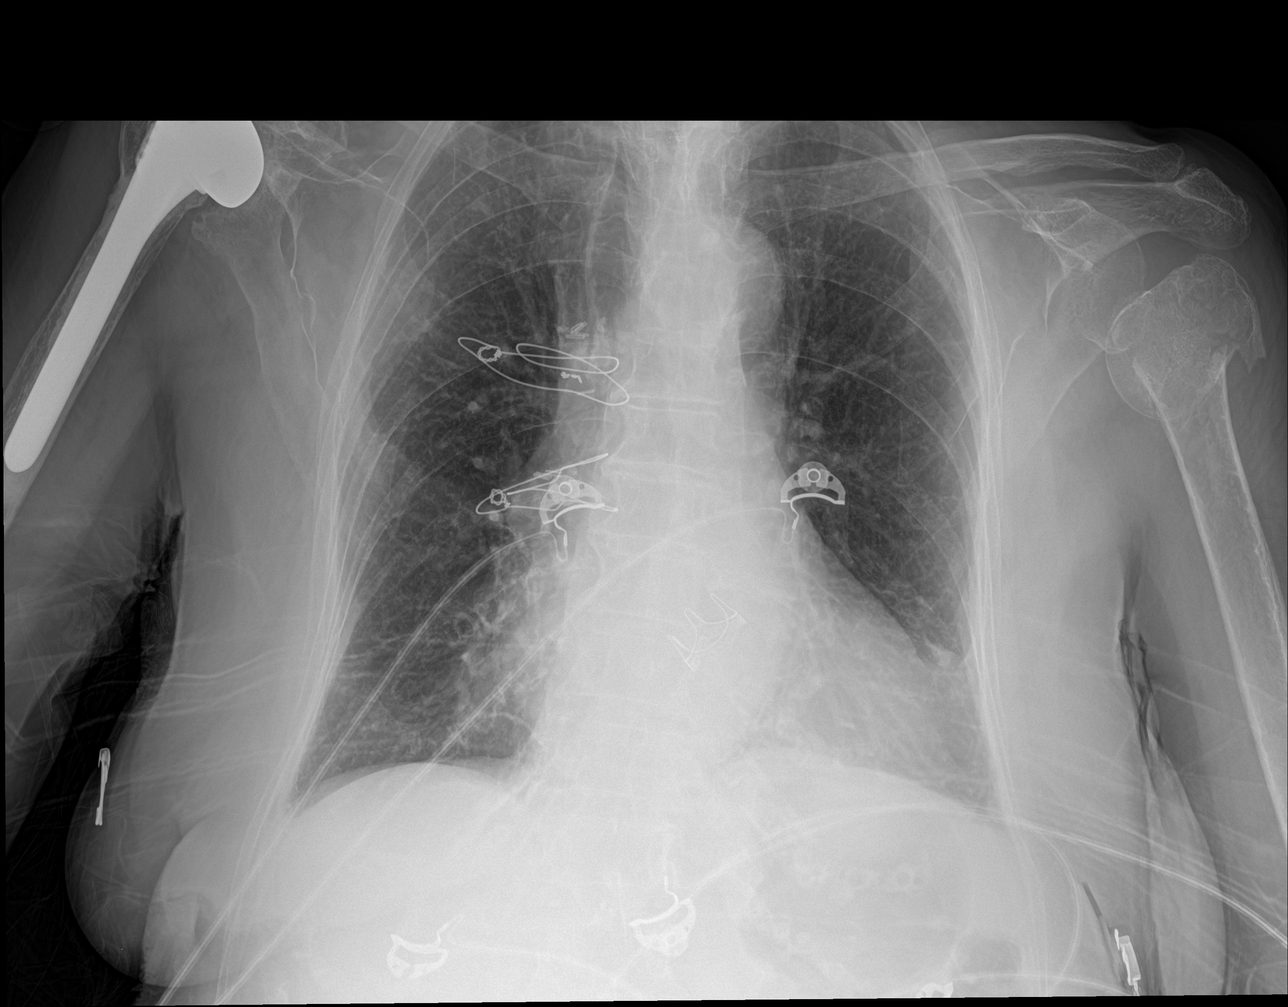

[3 of 3 positions shown; findings below may reference images not displayed]

FINDINGS: Positioning is mildly limited by the patient's recent proximal left
humeral fracture. The heart size and mediastinal contours are stable
status post median sternotomy and aortic valve replacement. The
lungs are clear. There is no pleural effusion or pneumothorax. The
bones are diffusely demineralized. Patient is status post right
total shoulder arthroplasty. Fracture of the proximal left humerus
is noted. No acute fractures are seen.
IMPRESSION: No acute cardiopulmonary process.

## 2015-11-27 MED ORDER — SIMVASTATIN 20 MG PO TABS
20.0000 mg | ORAL_TABLET | Freq: Every day | ORAL | Status: DC
  Administered 2015-11-27: 20 mg via ORAL
  Filled 2015-11-27 (×2): qty 1

## 2015-11-27 MED ORDER — METOPROLOL TARTRATE 100 MG PO TABS
100.0000 mg | ORAL_TABLET | Freq: Two times a day (BID) | ORAL | Status: DC
  Administered 2015-11-27 – 2015-11-28 (×2): 100 mg via ORAL
  Filled 2015-11-27: qty 1
  Filled 2015-11-27: qty 2

## 2015-11-27 MED ORDER — ACETAMINOPHEN 500 MG PO TABS
1000.0000 mg | ORAL_TABLET | Freq: Two times a day (BID) | ORAL | Status: DC
  Administered 2015-11-27 – 2015-11-28 (×2): 1000 mg via ORAL
  Filled 2015-11-27 (×2): qty 2

## 2015-11-27 MED ORDER — SENNOSIDES-DOCUSATE SODIUM 8.6-50 MG PO TABS
ORAL_TABLET | ORAL | Status: AC
  Filled 2015-11-27: qty 1

## 2015-11-27 MED ORDER — DOCUSATE SODIUM 100 MG PO CAPS
100.0000 mg | ORAL_CAPSULE | Freq: Two times a day (BID) | ORAL | Status: DC
  Administered 2015-11-27 – 2015-11-28 (×2): 100 mg via ORAL
  Filled 2015-11-27 (×3): qty 1

## 2015-11-27 MED ORDER — SENNA 8.6 MG PO TABS
1.0000 | ORAL_TABLET | Freq: Two times a day (BID) | ORAL | Status: DC
  Administered 2015-11-27 – 2015-11-28 (×2): 8.6 mg via ORAL
  Filled 2015-11-27 (×2): qty 1

## 2015-11-27 MED ORDER — DOCUSATE SODIUM 100 MG PO CAPS: 100.0000 mg | ORAL_CAPSULE | Freq: Every day | ORAL | Status: AC | PRN

## 2015-11-27 NOTE — ED Notes (Signed)
Pts arm brace has been removed and left arm sling has been applied. Pt talked through process so that home help could help to replace sling when taken off. Family member present throughout teaching and verbalized understanding of process.

## 2015-11-27 NOTE — H&P (Signed)
[facility] NAME: Marilyn Oconnell    MR#:  [id number]  DATE OF BIRTH:  04-Dec-1923  DATE OF ADMISSION:  11/27/2015  PRIMARY CARE PHYSICIAN: Margarita Rana, MD   REQUESTING/REFERRING PHYSICIAN:   CHIEF COMPLAINT:   Chief Complaint  Patient presents with  . Chest Pain    HISTORY OF PRESENT ILLNESS: Marilyn Oconnell  is a 82 y.o. female with a known history of paroxysmal atrial fibrillation, COPD, CHF, porcine valve , CK D stage III, gout, recent left humerus fracture who was placed in a brace by Dr. Earnestine Leys at around 11/11/2015 who comes to the hospital with complaints of chest pain. According to patient, she has been having progressive shortness of breath as well as chest pain for the past one week she was complaining of brace being very tight around her chest. On EMS arrival, patient was noted to be in A. fib, RVR, heart rate of 140. Her systolic blood pressure was normal at 110/60 and O2 sats were 94% on room air. Patient was brought to emergency room where EKG revealed just normal sinus rhythm at rate of 73 with no acute ST-T changes.  However, patient's labs revealed mild elevation of troponin and hospitalist services were contacted for admission. Of note, patient's family is concerned about her vision problems. Apparently patient was recently diagnosed with hallucinations which were felt to be related to her macular degeneration. However, she progressively is having more disturbing hallucinations which do not allow her to rest well at nighttime . She was recently diagnosed with gout for which cultures and was administered. However, because of worsening renal insufficiency as well as gastrointestinal symptoms, nausea, vomiting, patient's colchicine was stopped. Patient was having more shortness of breath as well as proximity swelling and was initiated on Lasix which help her with shortness of breath somewhat, although patient admits of  sleeping relatively upright because of shortness of breath . I'm not able to get review of systems  , apart from above due to significant emotional discomfort, bleeding from IV site in emergency room   PAST MEDICAL HISTORY:   Past Medical History  Diagnosis Date  . Hypertension   . Hypercholesteremia   . Coronary artery disease   . Arthritis   . COPD (chronic obstructive pulmonary disease) ([place])   . CHF (congestive heart failure) ([place])     PAST SURGICAL HISTORY: Past Surgical History  Procedure Laterality Date  . Shoulder surgery  09/1999  . Sigmoidoscopy  1992  . Dilation and curettage of uterus  1988  . Aortic valve replacement  2006    SOCIAL HISTORY:  Social History  Substance Use Topics  . Smoking status: Never Smoker   . Smokeless tobacco: Never Used  . Alcohol Use: No    FAMILY HISTORY:  Family History  Problem Relation Age of Onset  . Aneurysm Father   . Lung cancer Brother   . Prostate cancer Brother   . Heart failure Brother     DRUG ALLERGIES:  Allergies  Allergen Reactions  . Iodine Anaphylaxis  . Shellfish Allergy Anaphylaxis  . Azithromycin Hives  . Sulfa Antibiotics Hives    Review of Systems  Constitutional: Negative for fever, chills, weight loss and malaise/fatigue.  HENT: Negative for congestion.   Eyes: Negative for blurred vision and double vision.  Respiratory: Positive for shortness of breath. Negative for cough, sputum production and wheezing.   Cardiovascular: Positive for chest pain, palpitations, orthopnea and leg  swelling. Negative for PND.  Gastrointestinal: Negative for nausea, vomiting, abdominal pain, diarrhea, constipation, blood in stool and melena.  Genitourinary: Negative for dysuria, urgency, frequency and hematuria.  Musculoskeletal: Negative for falls.  Skin: Negative for rash.  Neurological: Negative for dizziness and weakness.  Psychiatric/Behavioral: Positive for depression and hallucinations. Negative for memory  loss. The patient is not nervous/anxious.     MEDICATIONS AT HOME:  Prior to Admission medications   Medication Sig Start Date End Date Taking? Authorizing Provider  acetaminophen (TYLENOL) 500 MG tablet Take 1,000 mg by mouth 2 (two) times daily.   Yes Historical Provider, MD  albuterol (PROVENTIL) (2.5 MG/3ML) 0.083% nebulizer solution Take 2.5 mg by nebulization every 6 (six) hours as needed for wheezing or shortness of breath.   Yes Historical Provider, MD  aspirin EC 81 MG tablet Take 81 mg by mouth daily.   Yes Historical Provider, MD  Fluticasone-Salmeterol (ADVAIR) 250-50 MCG/DOSE AEPB Inhale 1 puff into the lungs 2 (two) times daily.   Yes Historical Provider, MD  furosemide (LASIX) 20 MG tablet Take 1 tablet (20 mg total) by mouth daily. 11/18/15  Yes Margarita Rana, MD  metoprolol (LOPRESSOR) 100 MG tablet Take 100 mg by mouth 2 (two) times daily.   Yes Historical Provider, MD  ondansetron (ZOFRAN ODT) 4 MG disintegrating tablet Take 1 tablet (4 mg total) by mouth every 8 (eight) hours as needed for nausea or vomiting. 11/11/15  Yes Carrie Mew, MD  simvastatin (ZOCOR) 20 MG tablet Take 20 mg by mouth at bedtime.   Yes Historical Provider, MD  docusate sodium (COLACE) 100 MG capsule Take 1 capsule (100 mg total) by mouth daily as needed. 11/27/15 11/26/16  Schuyler Amor, MD      PHYSICAL EXAMINATION:   VITAL SIGNS: Blood pressure 91/71, pulse 104, temperature 97.9 F (36.6 C), resp. rate 16, height 5\' 1"  (1.549 m), weight 61.236 kg (135 lb), SpO2 98 %.  GENERAL:  82 y.o.-year-old patient lying in the bein moderate emotional distress, tearful   EYES: Pupils equal, round, reactive to light and accommodation. No scleral icterus. Extraocular muscles intact.  HEENT: Head atraumatic, normocephalic. Oropharynx and nasopharynx clear.  NECK:  Supple, no jugular venous distention. No thyroid enlargement, no tenderness.  LUNGS: Normal breath sounds bilaterally, no wheezing, rales,rhonchi ,   anterior crepitations bilaterally noted. No use of accessory muscles of respiration.  CARDIOVASCULAR: S1, S2 normal.  2/6 systolic murmur, . No rubs, or gallops.  ABDOMEN: Soft, nontender, nondistended. Bowel sounds present. No organomegaly or mass.  EXTREMITIES: No pedal edema, cyanosis, or clubbing.  NEUROLOGIC: Cranial nerves II through XII are intact. Muscle strength 5/5 in all extremities. Sensation intact. Gait not checked.  PSYCHIATRIC: The patient is alert and oriented x 3.  SKIN: No obvious rash, lesion, or ulcer.   LABORATORY PANEL:   CBC  Recent Labs Lab 11/22/15 0951  WBC 8.2  HCT 31.9*  PLT 323  MCV 94  MCH 29.6  MCHC 31.7  RDW 14.2  LYMPHSABS 1.1  EOSABS 0.3  BASOSABS 0.0   ------------------------------------------------------------------------------------------------------------------  Chemistries   Recent Labs Lab 11/27/15 1106  NA 139  K 3.8  CL 104  CO2 25  GLUCOSE 140*  BUN 38*  CREATININE 1.45*  CALCIUM 9.0  AST 25  ALT 32  ALKPHOS 99  BILITOT 0.8   ------------------------------------------------------------------------------------------------------------------  Cardiac Enzymes  Recent Labs Lab 11/27/15 1106 11/27/15 1456  TROPONINI <0.03 0.07*   ------------------------------------------------------------------------------------------------------------------  RADIOLOGY: Dg Chest 2 View  11/27/2015  CLINICAL DATA:  Chest pain. History of COPD and aortic valve replacement. EXAM: CHEST  2 VIEW COMPARISON:  07/21/2004 radiographs.  CT 06/27/2010. FINDINGS: Positioning is mildly limited by the patient's recent proximal left humeral fracture. The heart size and mediastinal contours are stable status post median sternotomy and aortic valve replacement. The lungs are clear. There is no pleural effusion or pneumothorax. The bones are diffusely demineralized. Patient is status post right total shoulder arthroplasty. Fracture of the proximal  left humerus is noted. No acute fractures are seen. IMPRESSION: No acute cardiopulmonary process. Electronically Signed   By: Richardean Sale M.D.   On: 11/27/2015 12:26    EKG: Orders placed or performed during the hospital encounter of 11/27/15  . EKG 12-Lead  . EKG 12-Lead  . ED EKG  . ED EKG  . EKG 12-Lead  . EKG 12-Lead    IMPRESSION AND PLAN:  Principal Problem:   Chest pain Active Problems:   Paroxysmal atrial fibrillation with RVR (HCC)   Dyspnea   Elevated troponin   Depression   Constipation   Fracture of left humerus #1. Chest pain, possibly related to A. fib, RVR, although patient admits of having some intermittent chest pains even without palpitations. We'll get cardiologist, Dr. Ubaldo Glassing to see  patient, follow cardiac enzymes 3, continue metoprolol, aspirin, nitroglycerin as well as Lovenox, get Myoview stress test? #2. A. fib, RVR, continue metoprolol, may not be sufficient, patient would benefit from anticoagulation. #3. Dyspnea, unclear etiology, possibly CHF, continue Lasix, get echocardiogram, as echocardiogram done in 2015 revealed severe aortic stenosis, moderate aortic regurgitation #4. Elevated troponin, likely due to demand ischemia, follow cardiac enzymes 3. #5 depression, get psychiatry involved for further recommendations. #6. Constipation. Initiate patient on Colace and Senokot. #7. Left humerus fracture. Continue left arm sling    All the records are reviewed and case discussed with ED provider. Management plans discussed with the patient, family and they are in agreement.  CODE STATUS: Code Status History    This patient does not have a recorded code status. Please follow your organizational policy for patients in this situation.    Advance Directive Documentation        Most Recent Value   Type of Advance Directive  Healthcare Power of Attorney, Living will   Pre-existing out of facility DNR order (yellow form or pink MOST form)     "MOST"  Form in Place?         TOTAL TIME TAKING CARE OF THIS PATIENT: 50 minutes.  Discussed with patient's daughter extensively   Shamarie Call M.D on 11/27/2015 at 5:46 PM  Between 7am to 6pm - Pager - [phone] After 6pm go to [email or web address] - password [email or web address] [facility]  [facility]  Office  [phone]  CC: Primary care physician; Margarita Rana, MD

## 2015-11-27 NOTE — Discharge Instructions (Signed)

## 2015-11-27 NOTE — ED Notes (Signed)
Pt reports last BM one week ago.

## 2015-11-27 NOTE — ED Notes (Signed)
Lab reports they are unable to add blood work onto blue top that is in lab.

## 2015-11-27 NOTE — ED Provider Notes (Addendum)
[facility] Emergency Department Provider Note  ____________________________________________   I have reviewed the triage vital signs and the nursing notes.   HISTORY  Chief Complaint Chest Pain    HPI Marilyn Oconnell is a 82 y.o. female with history of porcine valve replacement remotely COPD and CHF but no history of CAD documented presents today complaining of a small pain. She states that she has to wear a cuff and collar with a elastic binder because she recently broke her arm and it is uncomfortable against her chest. It hurts when she touches it hurts when she changes position. Pain is not pleuritic. She denies shortness of breath. She tells me she's had pain every minute of the day since they put the brace on her. She's wearing a elastic brace around her chest to hold onto her arm. She has had no nausea no vomiting. Patient does have a history of atrial fibrillation. She is on aspirin only however. En route she did get nitroglycerin she states that that diminished somewhat due to discomfort although it is still there she changes position or touches it. She describes the pain as a cramping pain from the binder. It is worse when she moves better when she doesn't move worse when she touches it better when she doesn't touch it. She also states of the tick the binder off at the same time the gave her the nitroglycerin and she thinks that her relief discomfort had more to do with the binder removal than the nitroglycerin.  Past Medical History  Diagnosis Date  . Hypertension   . Hypercholesteremia   . Coronary artery disease   . Arthritis   . COPD (chronic obstructive pulmonary disease) ([place])   . CHF (congestive heart failure) [facility])     Patient Active Problem List   Diagnosis Date Noted  . Humerus fracture 11/21/2015  . Adaptation reaction 09/28/2015  . Aortic heart valve narrowing 09/28/2015  . Benign hypertension 09/28/2015  . Bilateral cataracts 09/28/2015   . Cardiac murmur 09/28/2015  . Chronic kidney disease (CKD), stage III (moderate) 09/28/2015  . Clinical depression 09/28/2015  . Gout 09/28/2015  . 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/2015  . OP (osteoporosis) 09/28/2015  . Subclinical hypothyroidism 09/28/2015  . Atherosclerosis of coronary artery 09/08/2015  . Avitaminosis D 07/11/2015  . LBP (low back pain) 07/11/2015  . BP (high blood pressure) 07/11/2015  . Hyperlipemia 04/12/2015  . Billowing mitral valve 08/03/2014  . Arteriosclerosis of coronary artery 08/03/2014  . Fibrosis of lung ([place]) 05/25/2014  . Asthma-chronic obstructive pulmonary disease overlap syndrome ([place]) 05/25/2014  . H/O aortic valve replacement 11/09/2004    Past Surgical History  Procedure Laterality Date  . Shoulder surgery  09/1999  . Sigmoidoscopy  1992  . Dilation and curettage of uterus  1988  . Aortic valve replacement  2006    Current Outpatient Rx  Name  Route  Sig  Dispense  Refill  . ADVAIR DISKUS 250-50 MCG/DOSE AEPB      USE 1 INHALATION BY MOUTH EVERY 12 HOURS. <<RINSE MOUTH AFTER USE>>. Patient not taking: Reported on 11/21/2015   180 each   1   . albuterol (ACCUNEB) 0.63 MG/3ML nebulizer solution   Inhalation   Inhale into the lungs.         Marland Kitchen aspirin 81 MG tablet   Oral   Take by mouth.         . Cholecalciferol 1000 units capsule   Oral   Take by mouth. Reported  on 11/21/2015         . colchicine (COLCRYS) 0.6 MG tablet   Oral   Take 1 tablet (0.6 mg total) by mouth daily. Two times a day today and then one a day   30 tablet   0   . furosemide (LASIX) 20 MG tablet   Oral   Take 1 tablet (20 mg total) by mouth daily.   60 tablet   0   . metoprolol (LOPRESSOR) 100 MG tablet      TAKE 1 TABLET BY MOUTH TWICE DAILY   180 tablet   3   . ondansetron (ZOFRAN ODT) 4 MG disintegrating tablet   Oral   Take 1 tablet (4 mg total) by mouth every 8 (eight) hours as needed for nausea or vomiting.   20 tablet   0   .  oxyCODONE-acetaminophen (PERCOCET/ROXICET) 5-325 MG tablet   Oral   Take by mouth every 4 (four) hours as needed for severe pain.           Allergies Azithromycin; Iodine; Shellfish allergy; Sulfa antibiotics; and Sulfamethoxazole-trimethoprim  Family History  Problem Relation Age of Onset  . Aneurysm Father   . Lung cancer Brother   . Prostate cancer Brother   . Heart failure Brother     Social History Social History  Substance Use Topics  . Smoking status: Never Smoker   . Smokeless tobacco: Never Used  . Alcohol Use: No    Review of Systems Constitutional: No fever/chills Eyes: No visual changes. ENT: No sore throat. No stiff neck no neck pain Cardiovascular: See history of present illness regarding chest pain Respiratory: Denies shortness of breath. Gastrointestinal:   no vomiting.  No diarrhea.  No constipation. Genitourinary: Negative for dysuria. Musculoskeletal: Negative lower extremity swelling Skin: Negative for rash. Neurological: Negative for headaches, focal weakness or numbness. 10-point ROS otherwise negative.  ____________________________________________   PHYSICAL EXAM:  VITAL SIGNS: ED Triage Vitals  Enc Vitals Group     BP 11/27/15 1048 94/65 mmHg     Pulse --      Resp 11/27/15 1048 18     Temp 11/27/15 1048 97.9 F (36.6 C)     Temp src --      SpO2 11/27/15 1048 95 %     Weight 11/27/15 1048 135 lb (61.236 kg)     Height 11/27/15 1048 5\' 1"  (1.549 m)     Head Cir --      Peak Flow --      Pain Score 11/27/15 1049 6     Pain Loc --      Pain Edu? --      Excl. in [place]? --     Constitutional: Alert and oriented. Well appearing and in no acute distress. Eyes: Conjunctivae are normal. PERRL. EOMI. Head: Atraumatic. Nose: No congestion/rhinnorhea. Mouth/Throat: Mucous membranes are moist.  Oropharynx non-erythematous. Neck: No stridor.   Nontender with no meningismus Cardiovascular: Regular rate, irregularly irregular rhythm   Good  peripheral circulation. Chest: Tender to palpation diffusely especially in the left anterior chest rib attaches area patient states "ouch that's the pain right there" and pulls back. No crepitus no flail chest no palpated rib fracture. This is the area with the binders. There is no bruising. Respiratory: Normal respiratory effort.  No retractions. Lungs CTAB. Abdominal: Soft and nontender. No distention. No guarding no rebound Back:  There is no focal tenderness or step off there is no midline tenderness there are no lesions noted.  there is no CVA tenderness Musculoskeletal: No lower extremity tenderness. No joint effusions, no DVT signs strong distal pulses no edema, upper extremity tenderness upon point of fracture noted Neurologic:  Normal speech and language. No gross focal neurologic deficits are appreciated.  Skin:  Skin is warm, dry and intact. No rash noted. Psychiatric: Mood and affect are normal. Speech and behavior are normal.  ____________________________________________   LABS (all labs ordered are listed, but only abnormal results are displayed)  Labs Reviewed  PROTIME-INR  TROPONIN I  COMPREHENSIVE METABOLIC PANEL  LIPASE, BLOOD   ____________________________________________  EKG  I personally interpreted any EKGs ordered by me or triage  ____________________________________________  RADIOLOGY  I reviewed any imaging ordered by me or triage that were performed during my shift ____________________________________________   PROCEDURES  Procedure(s) performed: None  Critical Care performed: None  ____________________________________________   INITIAL IMPRESSION / ASSESSMENT AND PLAN / ED COURSE  Pertinent labs & imaging results that were available during my care of the patient were reviewed by me and considered in my medical decision making (see chart for details).  Patient with very reproducible chest wall pain in the context of a elastic binder which she  is wearing to help support her arm. Although she states it did seem to get somewhat better with nitroglycerin I do not think this likely represents ACS nonetheless, we will check cardiac enzymes. The patient has had instructed pain for a week and a half she states at this time, which is different from what the triage note suggests. However I'm not certain given the reproducibility of the pain is clear inciting mechanism, endocrine intensity of the complaint and serial cardiac enzymes to be of utility to the patient. Patient states her pain got better on the way and nonlabored because of the nitroglycerin but also because they took the binder off. In any event there is no evidence of dissection or PE. We will watch the patient carefully here and obtain chest x-ray basic workup and reassess.  ----------------------------------------- 1:23 PM on 11/27/2015 -----------------------------------------  Workup is unremarkable at this time. Very reassuring evaluation. Patient has been offered to stay here for further cardiac workup including serial chronic enzymes which you prefer to go home. I don't think this is unreasonable. She has a clear inciting  reason to have very reproducible musculoskeletal pain. Her pain is worse when the binder is on and better when the binder is off. I do not think this represents ACS. She has had uninterrupted pain now since they put the binder on her over a week ago, which greatly limits the likely benefit from serial cardiac enzymes. In any event, she and family declined further evaluation and do not think that is unreasonable we will see if we can get the binder to fit her in a way that is not as uncomfortable and is to be help that she won't have to work for much longer.  ----------------------------------------- 2:06 PM on 11/27/2015 -----------------------------------------  Page Dr. Earnestine Leys, however he is not operating room. It is been sometime since the patient's  injury, and she is not tolerating this binder. We'll place her in a splint which I think will help her discomfort. We will send a second cardiac enzyme and reassess  ----------------------------------------- 4:22 PM on 11/27/2015 -----------------------------------------  While it is certainly to the patient has reproduce will chest wall pain, we are unable to rule out other concomitant etiologies for her chest pain including ACS, we did send a second troponin for  this reason, and it comes back positive which is suggestive of other underlying disease pathology as yet not overtly manifest. For this reason however we will admit the patient to the hospital for further workup.  ____________________________________________   FINAL CLINICAL IMPRESSION(S) / ED DIAGNOSES  Final diagnoses:  None      This chart was dictated using voice recognition software.  Despite best efforts to proofread,  errors can occur which can change meaning.     Schuyler Amor, MD 11/27/15 1137  Schuyler Amor, MD 11/27/15 [place], MD 11/27/15 1406  Schuyler Amor, MD 11/27/15 [phone]

## 2015-11-27 NOTE — ED Notes (Signed)
Pt c/o back pain and abdominal pain, pt turned to right side to assist with relief of back pain.

## 2015-11-27 NOTE — ED Notes (Signed)
Pt arrived via EMS from home for complaints of CP. EMS reports giving nitrogylcerin x 2 and 324 mg ASA. EMS reports pt in a-fib on monitor with HR 140. EMS BP 11060, SpO2 94% RA. Pt reports CP began last night. Pt has history of valve replacement. Pt presents with fracture to left arm that occurred last week.

## 2015-11-27 NOTE — ED Notes (Signed)
Assisted pt up to bathroom and back into bed.

## 2015-11-28 ENCOUNTER — Telehealth: Payer: Self-pay | Admitting: Family Medicine

## 2015-11-28 ENCOUNTER — Observation Stay
Admit: 2015-11-28 | Discharge: 2015-11-28 | Disposition: A | Discharge status: Home / Self Care | Payer: Medicare Other | Attending: Internal Medicine | Admitting: Internal Medicine

## 2015-11-28 DIAGNOSIS — R0600 Dyspnea, unspecified: Secondary | ICD-10-CM | POA: Diagnosis not present

## 2015-11-28 DIAGNOSIS — I4891 Unspecified atrial fibrillation: Secondary | ICD-10-CM | POA: Diagnosis not present

## 2015-11-28 DIAGNOSIS — R748 Abnormal levels of other serum enzymes: Secondary | ICD-10-CM | POA: Diagnosis not present

## 2015-11-28 DIAGNOSIS — R079 Chest pain, unspecified: Secondary | ICD-10-CM | POA: Diagnosis not present

## 2015-11-28 DIAGNOSIS — F329 Major depressive disorder, single episode, unspecified: Secondary | ICD-10-CM | POA: Diagnosis not present

## 2015-11-28 LAB — BASIC METABOLIC PANEL
Anion gap: 5 (ref 5–15)
BUN: 31 mg/dL — ABNORMAL HIGH (ref 6–20)
CO2: 27 mmol/L (ref 22–32)
Calcium: 8.8 mg/dL — ABNORMAL LOW (ref 8.9–10.3)
Chloride: 105 mmol/L (ref 101–111)
Creatinine, Ser: 1.07 mg/dL — ABNORMAL HIGH (ref 0.44–1.00)
GFR calc Af Amer: 52 mL/min — ABNORMAL LOW (ref >60)
GFR calc non Af Amer: 45 mL/min — ABNORMAL LOW (ref >60)
Glucose, Bld: 103 mg/dL — ABNORMAL HIGH (ref 65–99)
Potassium: 4.2 mmol/L (ref 3.5–5.1)
Sodium: 137 mmol/L (ref 135–145)

## 2015-11-28 LAB — TROPONIN I
Troponin I: 0.08 ng/mL — ABNORMAL HIGH (ref <0.031)
Troponin I: 0.06 ng/mL — ABNORMAL HIGH (ref <0.031)

## 2015-11-28 LAB — CBC
HCT: 31 % — ABNORMAL LOW (ref 35.0–47.0)
HCT: 29.2 % — ABNORMAL LOW (ref 35.0–47.0)
Hemoglobin: 10.2 g/dL — ABNORMAL LOW (ref 12.0–16.0)
Hemoglobin: 9.8 g/dL — ABNORMAL LOW (ref 12.0–16.0)
MCH: 30 pg (ref 26.0–34.0)
MCH: 30.4 pg (ref 26.0–34.0)
MCHC: 33 g/dL (ref 32.0–36.0)
MCHC: 33.7 g/dL (ref 32.0–36.0)
MCV: 90.8 fL (ref 80.0–100.0)
MCV: 90.2 fL (ref 80.0–100.0)
Platelets: 339 10*3/uL (ref 150–440)
Platelets: 301 10*3/uL (ref 150–440)
RBC: 3.41 MIL/uL — ABNORMAL LOW (ref 3.80–5.20)
RBC: 3.24 MIL/uL — ABNORMAL LOW (ref 3.80–5.20)
RDW: 14 % (ref 11.5–14.5)
RDW: 13.8 % (ref 11.5–14.5)
WBC: 12.3 10*3/uL — ABNORMAL HIGH (ref 3.6–11.0)
WBC: 10.6 10*3/uL (ref 3.6–11.0)

## 2015-11-28 LAB — CREATININE, SERUM
Creatinine, Ser: 1.26 mg/dL — ABNORMAL HIGH (ref 0.44–1.00)
GFR calc Af Amer: 42 mL/min — ABNORMAL LOW (ref >60)
GFR calc non Af Amer: 37 mL/min — ABNORMAL LOW (ref >60)

## 2015-11-28 LAB — TSH: TSH: 2.57 u[IU]/mL (ref 0.350–4.500)

## 2015-11-28 MED ORDER — SODIUM CHLORIDE 0.9% FLUSH: 3.0000 mL | INTRAVENOUS | Status: DC | PRN

## 2015-11-28 MED ORDER — SODIUM CHLORIDE 0.9% FLUSH
3.0000 mL | Freq: Two times a day (BID) | INTRAVENOUS | Status: DC
  Administered 2015-11-28 (×2): 3 mL via INTRAVENOUS

## 2015-11-28 MED ORDER — ALBUTEROL SULFATE (2.5 MG/3ML) 0.083% IN NEBU: 2.5000 mg | INHALATION_SOLUTION | Freq: Four times a day (QID) | RESPIRATORY_TRACT | Status: DC | PRN

## 2015-11-28 MED ORDER — ASPIRIN EC 81 MG PO TBEC
81.0000 mg | DELAYED_RELEASE_TABLET | Freq: Every day | ORAL | Status: DC
  Administered 2015-11-28: 81 mg via ORAL
  Filled 2015-11-28: qty 1

## 2015-11-28 MED ORDER — ENOXAPARIN SODIUM 30 MG/0.3ML ~~LOC~~ SOLN: 30.0000 mg | SUBCUTANEOUS | Status: DC

## 2015-11-28 MED ORDER — ONDANSETRON 4 MG PO TBDP
4.0000 mg | ORAL_TABLET | Freq: Three times a day (TID) | ORAL | Status: DC | PRN
  Filled 2015-11-28: qty 1

## 2015-11-28 MED ORDER — SODIUM CHLORIDE 0.9 % IV SOLN: 250.0000 mL | INTRAVENOUS | Status: DC | PRN

## 2015-11-28 MED ORDER — MOMETASONE FURO-FORMOTEROL FUM 200-5 MCG/ACT IN AERO
2.0000 | INHALATION_SPRAY | Freq: Two times a day (BID) | RESPIRATORY_TRACT | Status: DC
  Administered 2015-11-28: 2 via RESPIRATORY_TRACT
  Filled 2015-11-28: qty 8.8

## 2015-11-28 MED ORDER — SODIUM CHLORIDE 0.9% FLUSH
3.0000 mL | Freq: Two times a day (BID) | INTRAVENOUS | Status: DC
  Administered 2015-11-28: 3 mL via INTRAVENOUS

## 2015-11-28 MED ORDER — FUROSEMIDE 20 MG PO TABS
20.0000 mg | ORAL_TABLET | Freq: Every day | ORAL | Status: DC
  Administered 2015-11-28: 20 mg via ORAL
  Filled 2015-11-28: qty 1

## 2015-11-28 MED ORDER — PROMETHAZINE HCL 25 MG PO TABS: 12.5000 mg | ORAL_TABLET | Freq: Four times a day (QID) | ORAL | Status: DC | PRN

## 2015-11-28 NOTE — Care Management (Signed)
Spoke with patient's [name]  (RN- Director of Home Care Providers)) says that patient Marilyn Oconnell Needs Syndrome and has visual hallucinations related to her site loss from her macular degeneration.  She has no dementia, no psych history.  Asks that psych consult be discontinued.  Obtain order.  Patient placed in observation for chest pain.  She has round the clock caregivers at home.

## 2015-11-28 NOTE — Discharge Summary (Signed)
[facility] NAME: Marilyn Oconnell    MR#:  [id number]  DATE OF BIRTH:  Mar 04, 1924  DATE OF ADMISSION:  11/27/2015 ADMITTING PHYSICIAN: Theodoro Grist, MD  DATE OF DISCHARGE: 11/28/2015 12:40 PM  PRIMARY CARE PHYSICIAN: Margarita Rana, MD    ADMISSION DIAGNOSIS:  Chest wall pain [R07.89] Chest pain, unspecified chest pain type [R07.9]  DISCHARGE DIAGNOSIS:  Principal Problem:   Chest pain Active Problems:   Paroxysmal atrial fibrillation with RVR (HCC)   Dyspnea   Elevated troponin   Depression   Constipation   Fracture of left humerus   SECONDARY DIAGNOSIS:   Past Medical History  Diagnosis Date  . Hypertension   . Hypercholesteremia   . Coronary artery disease   . Arthritis   . COPD (chronic obstructive pulmonary disease) ([place])   . CHF (congestive heart failure) ([place])     HOSPITAL COURSE:   1. Chest pain. As per family this is secondary to a tight binder she had on her. Much improved after taking the binder off. 2. Rapid atrial fibrillation converted over to normal sinus rhythm. Continue aspirin for anticoagulation and metoprolol. 3. Elevated troponin secondary to demand ischemia from rapid atrial fibrillation. Follow-up with Dr. Ubaldo Glassing cardiology as outpatient. 4. Depression continue medications 5. History of essential hypertension continue usual medications 6. Hyperlipidemia unspecified on Zocor 7. Recent arm fracture in left arm sling  DISCHARGE CONDITIONS:   Satisfactory  CONSULTS OBTAINED:  Treatment Team:  Teodoro Spray, MD  DRUG ALLERGIES:   Allergies  Allergen Reactions  . Iodine Anaphylaxis  . Shellfish Allergy Anaphylaxis  . Azithromycin Hives  . Sulfa Antibiotics Hives    DISCHARGE MEDICATIONS:   Discharge Medication List as of 11/28/2015 11:38 AM    START taking these medications   Details  docusate sodium (COLACE) 100 MG capsule Take 1 capsule (100 mg total) by mouth daily as  needed., Starting 11/27/2015, Until Tue 11/26/16, Print      CONTINUE these medications which have NOT CHANGED   Details  acetaminophen (TYLENOL) 500 MG tablet Take 1,000 mg by mouth 2 (two) times daily., Until Discontinued, Historical Med    albuterol (PROVENTIL) (2.5 MG/3ML) 0.083% nebulizer solution Take 2.5 mg by nebulization every 6 (six) hours as needed for wheezing or shortness of breath., Until Discontinued, Historical Med    aspirin EC 81 MG tablet Take 81 mg by mouth daily., Until Discontinued, Historical Med    Fluticasone-Salmeterol (ADVAIR) 250-50 MCG/DOSE AEPB Inhale 1 puff into the lungs 2 (two) times daily., Until Discontinued, Historical Med    furosemide (LASIX) 20 MG tablet Take 1 tablet (20 mg total) by mouth daily., Starting 11/18/2015, Until Discontinued, Normal    metoprolol (LOPRESSOR) 100 MG tablet Take 100 mg by mouth 2 (two) times daily., Until Discontinued, Historical Med    ondansetron (ZOFRAN ODT) 4 MG disintegrating tablet Take 1 tablet (4 mg total) by mouth every 8 (eight) hours as needed for nausea or vomiting., Starting 11/11/2015, Until Discontinued, Print    simvastatin (ZOCOR) 20 MG tablet Take 20 mg by mouth at bedtime., Until Discontinued, Historical Med         DISCHARGE INSTRUCTIONS:   Follow-up PMD one week Follow-up with Dr. Ubaldo Glassing cardiology 1 week Follow-up with orthopedic surgery as scheduled  If you experience worsening of your admission symptoms, develop shortness of breath, life threatening emergency, suicidal or homicidal thoughts you must seek medical attention immediately by calling 911 or calling your  MD immediately  if symptoms less severe.  You Must read complete instructions/literature along with all the possible adverse reactions/side effects for all the Medicines you take and that have been prescribed to you. Take any new Medicines after you have completely understood and accept all the possible adverse reactions/side effects.    Please note  You were cared for by a hospitalist during your hospital stay. If you have any questions about your discharge medications or the care you received while you were in the hospital after you are discharged, you can call the unit and asked to speak with the hospitalist on call if the hospitalist that took care of you is not available. Once you are discharged, your primary care physician will handle any further medical issues. Please note that NO REFILLS for any discharge medications will be authorized once you are discharged, as it is imperative that you return to your primary care physician (or establish a relationship with a primary care physician if you do not have one) for your aftercare needs so that they can reassess your need for medications and monitor your lab values.    Today   CHIEF COMPLAINT:   Chief Complaint  Patient presents with  . Chest Pain    HISTORY OF PRESENT ILLNESS:  Marilyn Oconnell  is a 82 y.o. female presented with chest pain and had rapid atrial fibrillation   VITAL SIGNS:  Blood pressure 167/62, pulse 77, temperature 97.5 F (36.4 C), temperature source Oral, resp. rate 20, height 5\' 1"  (1.549 m), weight 59.33 kg (130 lb 12.8 oz), SpO2 97 %.    PHYSICAL EXAMINATION:  GENERAL:  82 y.o.-year-old patient lying in the bed with no acute distress.  EYES: Pupils equal, round, reactive to light and accommodation. No scleral icterus. Extraocular muscles intact.  HEENT: Head atraumatic, normocephalic. Oropharynx and nasopharynx clear.  NECK:  Supple, no jugular venous distention. No thyroid enlargement, no tenderness.  LUNGS: Normal breath sounds bilaterally, no wheezing, rales,rhonchi or crepitation. No use of accessory muscles of respiration.  CARDIOVASCULAR: S1, S2 normal. No murmurs, rubs, or gallops.  ABDOMEN: Soft, non-tender, non-distended. Bowel sounds present. No organomegaly or mass.  EXTREMITIES: No pedal edema, cyanosis, or clubbing.   NEUROLOGIC: Cranial nerves II through XII are intact. Sensation intact. Gait not checked.  PSYCHIATRIC: The patient is alert.  SKIN: No obvious rash, lesion, or ulcer.   DATA REVIEW:   CBC  Recent Labs Lab 11/28/15 0603  WBC 10.6  HGB 9.8*  HCT 29.2*  PLT 301    Chemistries   Recent Labs Lab 11/27/15 1106  11/28/15 0603  NA 139  --  137  K 3.8  --  4.2  CL 104  --  105  CO2 25  --  27  GLUCOSE 140*  --  103*  BUN 38*  --  31*  CREATININE 1.45*  < > 1.07*  CALCIUM 9.0  --  8.8*  AST 25  --   --   ALT 32  --   --   ALKPHOS 99  --   --   BILITOT 0.8  --   --   < > = values in this interval not displayed.  Cardiac Enzymes  Recent Labs Lab 11/28/15 0603  TROPONINI 0.06*    RADIOLOGY:  Dg Chest 2 View  11/27/2015  CLINICAL DATA:  Chest pain. History of COPD and aortic valve replacement. EXAM: CHEST  2 VIEW COMPARISON:  07/21/2004 radiographs.  CT 06/27/2010. FINDINGS: Positioning is mildly  limited by the patient's recent proximal left humeral fracture. The heart size and mediastinal contours are stable status post median sternotomy and aortic valve replacement. The lungs are clear. There is no pleural effusion or pneumothorax. The bones are diffusely demineralized. Patient is status post right total shoulder arthroplasty. Fracture of the proximal left humerus is noted. No acute fractures are seen. IMPRESSION: No acute cardiopulmonary process. Electronically Signed   By: Richardean Sale M.D.   On: 11/27/2015 12:26    Management plans discussed with the patient, family and they are in agreement.  CODE STATUS:     Code Status Orders        Start     Ordered   11/28/15 0004  Full code   Continuous     11/28/15 0003    Code Status History    Date Active Date Inactive Code Status Order ID Comments User Context   This patient has a current code status but no historical code status.    Advance Directive Documentation        Most Recent Value   Type of Advance  Directive  Healthcare Power of Attorney, Living will   Pre-existing out of facility DNR order (yellow form or pink MOST form)     "MOST" Form in Place?        TOTAL TIME TAKING CARE OF THIS PATIENT: 35 minutes.    Loletha Grayer M.D on 11/28/2015 at 3:26 PM  Between 7am to 6pm - Pager - [phone]  After 6pm go to [email or web address] - password [email or web address] [facility]  [facility]  Office  [phone]  CC: Primary care physician; Margarita Rana, MD

## 2015-11-28 NOTE — Consult Note (Signed)
[facility]  CARDIOLOGY CONSULT NOTE  Patient ID: LANIYA MAZAK MRN: [id number] DOB/AGE: 1924-07-23 82 y.o.  Admit date: 11/27/2015 Referring Physician Dr. Leslye Peer Primary Physician   Primary Cardiologist Dr. Ubaldo Glassing Reason for Consultation chest pain  HPI: Patient is an 82 year old female with history of aortic valve disease status post aortic valve replacement with a tissue prosthesis who was admitted with chest pain with both typical and atypical features. She had no significant troponin elevation other than a level of 0.10. Her pain is mostly in her abdomen. EKG revealed no ischemia. Echocardiogram revealed sinus rhythm with LVH no regional wall motion abnormality. She is back to normal symptomatically. She had a cardiac catheterization prior to her valve replacement showing less than 70% LAD stenosis.  Review of Systems  Constitutional: Negative.   HENT: Negative.   Eyes: Negative.   Respiratory: Negative.   Cardiovascular: Positive for chest pain.  Gastrointestinal: Negative.   Genitourinary: Negative.   Musculoskeletal: Negative.   Skin: Negative.   Neurological: Negative.   Endo/Heme/Allergies: Negative.   Psychiatric/Behavioral: Negative.     Past Medical History  Diagnosis Date  . Hypertension   . Hypercholesteremia   . Coronary artery disease   . Arthritis   . COPD (chronic obstructive pulmonary disease) ([place])   . CHF (congestive heart failure) (HCC)     Family History  Problem Relation Age of Onset  . Aneurysm Father   . Lung cancer Brother   . Prostate cancer Brother   . Heart failure Brother     Social History   Social History  . Marital Status: Married    Spouse Name: N/A  . Number of Children: N/A  . Years of Education: grade 8   Occupational History  . Not on file.   Social History Main Topics  . Smoking status: Never Smoker   . Smokeless tobacco: Never Used  . Alcohol Use: No  . Drug Use: No  . Sexual  Activity: Not on file   Other Topics Concern  . Not on file   Social History Narrative    Past Surgical History  Procedure Laterality Date  . Shoulder surgery  09/1999  . Sigmoidoscopy  1992  . Dilation and curettage of uterus  1988  . Aortic valve replacement  2006     Prescriptions prior to admission  Medication Sig Dispense Refill Last Dose  . acetaminophen (TYLENOL) 500 MG tablet Take 1,000 mg by mouth 2 (two) times daily.   11/27/2015 at 0800  . albuterol (PROVENTIL) (2.5 MG/3ML) 0.083% nebulizer solution Take 2.5 mg by nebulization every 6 (six) hours as needed for wheezing or shortness of breath.   11/27/2015 at Unknown time  . aspirin EC 81 MG tablet Take 81 mg by mouth daily.   11/27/2015 at 0800  . Fluticasone-Salmeterol (ADVAIR) 250-50 MCG/DOSE AEPB Inhale 1 puff into the lungs 2 (two) times daily.   11/26/2015 at Unknown time  . furosemide (LASIX) 20 MG tablet Take 1 tablet (20 mg total) by mouth daily. 60 tablet 0 11/27/2015 at Unknown time  . metoprolol (LOPRESSOR) 100 MG tablet Take 100 mg by mouth 2 (two) times daily.   11/27/2015 at 0800  . ondansetron (ZOFRAN ODT) 4 MG disintegrating tablet Take 1 tablet (4 mg total) by mouth every 8 (eight) hours as needed for nausea or vomiting. 20 tablet 0 Past Week at Unknown time  . simvastatin (ZOCOR) 20 MG tablet Take 20 mg by mouth at bedtime.  11/26/2015 at Unknown time    Physical Exam: Blood pressure 167/62, pulse 77, temperature 97.5 F (36.4 C), temperature source Oral, resp. rate 20, height 5\' 1"  (1.549 m), weight 59.33 kg (130 lb 12.8 oz), SpO2 97 %.   Wt Readings from Last 1 Encounters:  11/27/15 59.33 kg (130 lb 12.8 oz)     General appearance: alert and cooperative Resp: clear to auscultation bilaterally Chest wall: no tenderness Cardio: irregularly irregular rhythm GI: abnormal findings:  mild tenderness in the epigastrium and in the periumbilical area Pulses: 2+ and symmetric Neurologic: Grossly normal  Labs:    Lab Results  Component Value Date   WBC 10.6 11/28/2015   HGB 9.8* 11/28/2015   HCT 29.2* 11/28/2015   MCV 90.2 11/28/2015   PLT 301 11/28/2015    Recent Labs Lab 11/27/15 1106  11/28/15 0603  NA 139  --  137  K 3.8  --  4.2  CL 104  --  105  CO2 25  --  27  BUN 38*  --  31*  CREATININE 1.45*  < > 1.07*  CALCIUM 9.0  --  8.8*  PROT 6.6  --   --   BILITOT 0.8  --   --   ALKPHOS 99  --   --   ALT 32  --   --   AST 25  --   --   GLUCOSE 140*  --  103*  < > = values in this interval not displayed. Lab Results  Component Value Date   TROPONINI 0.06* 11/28/2015      Radiology: No acute cardiopulmonary process EKG: Atrial fibrillation with controlled ventricular response  ASSESSMENT AND PLAN: Patient is an 82 year old female with history of aortic valve disease status post aortic valve replacement. She was admitted with atypical chest pain. She has ruled out for myocardial infarction. Serum troponin was trivially elevated at 0.06 which does not appear to represent acute coronary syndrome or non-ST elevation myocardial infarction. Her chest tightness improved when releasing a tight sling she had over her shoulder. She is currently stable and is back at her baseline. Would ambulate and discharged home with outpatient follow-up in our office in one week. Continue with current medical regimen for now. Signed: Teodoro Spray MD, [facility] 11/28/2015, 12:23 PM

## 2015-11-28 NOTE — Care Management Obs Status (Signed)
MEDICARE OBSERVATION STATUS NOTIFICATION   Patient Details  Name: Marilyn Oconnell MRN: [id number] Date of Birth: 1923-10-01   Medicare Observation Status Notification Given:  Yes    Katrina Stack, RN 11/28/2015, 8:40 AM

## 2015-11-28 NOTE — Telephone Encounter (Signed)
Marilyn Oconnell scheduled hospital f/u with Dr. Venia Minks on 12/05/15 at 4 pm. Pt is being discharged today 11/28/15 and was treated for chest pain. Thanks TNP

## 2015-11-28 NOTE — Progress Notes (Signed)
*  PRELIMINARY RESULTS* Echocardiogram 2D Echocardiogram has been performed.  Marilyn Oconnell 11/28/2015, 8:38 AM

## 2015-11-28 NOTE — Progress Notes (Signed)
Patient discharged via wheelchair and private vehicle. IV removed and catheter intact. All discharge instructions given and patient verbalizes understanding. Tele removed and returned. No prescriptions given to patient No distress noted.   

## 2015-12-01 DIAGNOSIS — S42291A Other displaced fracture of upper end of right humerus, initial encounter for closed fracture: Secondary | ICD-10-CM | POA: Diagnosis not present

## 2015-12-04 ENCOUNTER — Other Ambulatory Visit: Payer: Self-pay | Admitting: Family Medicine

## 2015-12-04 ENCOUNTER — Ambulatory Visit
Admission: RE | Admit: 2015-12-04 | Discharge: 2015-12-04 | Disposition: A | Discharge status: Home / Self Care | Payer: Medicare Other | Source: Ambulatory Visit | Attending: Family Medicine | Admitting: Family Medicine

## 2015-12-04 DIAGNOSIS — R079 Chest pain, unspecified: Secondary | ICD-10-CM | POA: Insufficient documentation

## 2015-12-04 DIAGNOSIS — R109 Unspecified abdominal pain: Secondary | ICD-10-CM | POA: Diagnosis not present

## 2015-12-05 ENCOUNTER — Encounter: Payer: Self-pay | Admitting: Family Medicine

## 2015-12-05 ENCOUNTER — Other Ambulatory Visit: Payer: Self-pay

## 2015-12-05 ENCOUNTER — Ambulatory Visit (INDEPENDENT_AMBULATORY_CARE_PROVIDER_SITE_OTHER): Payer: Medicare Other | Admitting: Family Medicine

## 2015-12-05 VITALS — BP 114/60 | HR 76 | Temp 98.2°F | Resp 16 | Wt 132.0 lb

## 2015-12-05 DIAGNOSIS — F329 Major depressive disorder, single episode, unspecified: Secondary | ICD-10-CM

## 2015-12-05 DIAGNOSIS — K5909 Other constipation: Secondary | ICD-10-CM | POA: Diagnosis not present

## 2015-12-05 DIAGNOSIS — S42302D Unspecified fracture of shaft of humerus, left arm, subsequent encounter for fracture with routine healing: Secondary | ICD-10-CM

## 2015-12-05 DIAGNOSIS — F32A Depression, unspecified: Secondary | ICD-10-CM

## 2015-12-05 MED ORDER — ESCITALOPRAM OXALATE 10 MG PO TABS
10.0000 mg | ORAL_TABLET | Freq: Every day | ORAL | Status: DC
Start: 2015-12-05 — End: 2016-01-31

## 2015-12-05 NOTE — Progress Notes (Signed)
Subjective:    Patient ID: Marilyn Oconnell, female    DOB: [birth year], 82 y.o.   MRN: [id number]  HPI   Follow up Hospitalization  Patient was admitted to [facility] on 11/27/2015 and discharged on 11/28/2015. She was treated for chest pain. Treatment for this included labs, CXR, [place] Abd, Colace. She reports excellent compliance with treatment. She reports this condition is Unchanged. Pt is c/o generalized abdominal pain, which is a crampy pain, sometimes sharp, that happens at night. The pain radiates into her back and into her chest. Pain is present when laying down. Pt also c/o nausea. Pt tried Tums, with short-term relief.  Also tearful a lot. Husband's health is failing also. They are really not safe at home, but refuse to move.   ------------------------------------------------------------------------------------    Review of Systems  Constitutional: Positive for chills, activity change (due to arm fracture) and appetite change. Negative for fever, diaphoresis and fatigue.  Respiratory: Positive for shortness of breath (COPD). Negative for cough.   Cardiovascular: Positive for chest pain. Negative for palpitations and leg swelling.  Gastrointestinal: Positive for nausea, abdominal pain and constipation (improving with Colace. Last BM was today). Negative for vomiting, diarrhea, blood in stool and abdominal distention.   BP 114/60 mmHg  Pulse 76  Temp(Src) 98.2 F (36.8 C) (Oral)  Resp 16  Wt 132 lb (59.875 kg)  SpO2 95%   Patient Active Problem List   Diagnosis Date Noted  . Chest pain 11/27/2015  . Paroxysmal atrial fibrillation with RVR ([place]) 11/27/2015  . Dyspnea 11/27/2015  . Elevated troponin 11/27/2015  . Depression 11/27/2015  . Constipation 11/27/2015  . Fracture of left humerus 11/27/2015  . Humerus fracture 11/21/2015  . Adaptation reaction 09/28/2015  . Aortic heart valve narrowing 09/28/2015  . Benign hypertension 09/28/2015  . Bilateral cataracts 09/28/2015   . Cardiac murmur 09/28/2015  . Chronic kidney disease (CKD), stage III (moderate) 09/28/2015  . Clinical depression 09/28/2015  . Gout 09/28/2015  . 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/2015  . OP (osteoporosis) 09/28/2015  . Subclinical hypothyroidism 09/28/2015  . Atherosclerosis of coronary artery 09/08/2015  . Avitaminosis D 07/11/2015  . LBP (low back pain) 07/11/2015  . BP (high blood pressure) 07/11/2015  . Hyperlipemia 04/12/2015  . Billowing mitral valve 08/03/2014  . Arteriosclerosis of coronary artery 08/03/2014  . Fibrosis of lung ([place]) 05/25/2014  . Asthma-chronic obstructive pulmonary disease overlap syndrome ([place]) 05/25/2014  . H/O aortic valve replacement 11/09/2004   Past Medical History  Diagnosis Date  . Hypertension   . Hypercholesteremia   . Coronary artery disease   . Arthritis   . COPD (chronic obstructive pulmonary disease) ([place])   . CHF (congestive heart failure) ([place])    Current Outpatient Prescriptions on File Prior to Visit  Medication Sig  . acetaminophen (TYLENOL) 500 MG tablet Take 1,000 mg by mouth 2 (two) times daily.  Marland Kitchen albuterol (PROVENTIL) (2.5 MG/3ML) 0.083% nebulizer solution Take 2.5 mg by nebulization every 6 (six) hours as needed for wheezing or shortness of breath.  Marland Kitchen aspirin EC 81 MG tablet Take 81 mg by mouth daily.  Marland Kitchen docusate sodium (COLACE) 100 MG capsule Take 1 capsule (100 mg total) by mouth daily as needed.  . Fluticasone-Salmeterol (ADVAIR) 250-50 MCG/DOSE AEPB Inhale 1 puff into the lungs 2 (two) times daily.  . furosemide (LASIX) 20 MG tablet Take 1 tablet (20 mg total) by mouth daily.  . metoprolol (LOPRESSOR) 100 MG tablet Take 100 mg by mouth 2 (two) times daily.  Marland Kitchen  simvastatin (ZOCOR) 20 MG tablet Take 20 mg by mouth at bedtime.  . ondansetron (ZOFRAN ODT) 4 MG disintegrating tablet Take 1 tablet (4 mg total) by mouth every 8 (eight) hours as needed for nausea or vomiting. (Patient not taking: Reported on 12/05/2015)   No current  facility-administered medications on file prior to visit.   Allergies  Allergen Reactions  . Iodine Anaphylaxis  . Shellfish Allergy Anaphylaxis  . Azithromycin Hives  . Sulfa Antibiotics Hives   Past Surgical History  Procedure Laterality Date  . Shoulder surgery  09/1999  . Sigmoidoscopy  1992  . Dilation and curettage of uterus  1988  . Aortic valve replacement  2006   Social History   Social History  . Marital Status: Married    Spouse Name: N/A  . Number of Children: N/A  . Years of Education: grade 8   Occupational History  . Not on file.   Social History Main Topics  . Smoking status: Never Smoker   . Smokeless tobacco: Never Used  . Alcohol Use: No  . Drug Use: No  . Sexual Activity: Not on file   Other Topics Concern  . Not on file   Social History Narrative   Family History  Problem Relation Age of Onset  . Aneurysm Father   . Lung cancer Brother   . Prostate cancer Brother   . Heart failure Brother        Objective:   Physical Exam  Constitutional: She is oriented to person, place, and time. She appears well-developed and well-nourished.  Abdominal: Soft. Bowel sounds are normal. There is no tenderness.  Neurological: She is alert and oriented to person, place, and time.  Psychiatric: She has a normal mood and affect. Her behavior is normal. Judgment and thought content normal.  Tearful at times.   BP 114/60 mmHg  Pulse 76  Temp(Src) 98.2 F (36.8 C) (Oral)  Resp 16  Wt 132 lb (59.875 kg)  SpO2 95%     Assessment & Plan:  1. Other constipation Seems to be improved. Will monitor.   2. Humerus fracture, left, with routine healing, subsequent encounter Stressed importance of continuing arm exercises.    3. Clinical depression New problem. Worsening.  Suspect some of cause of abdominal pain. Will try restarting Lexapro and recheck in 4 weeks.  - escitalopram (LEXAPRO) 10 MG tablet; Take 1 tablet (10 mg total) by mouth daily. 1/2 a day for  7 days and then one a day.  Dispense: 30 tablet; Refill: 3   Patient was seen and examined by Jerrell Belfast, MD, and note scribed by Renaldo Fiddler, CMA.  I have reviewed the document for accuracy and completeness and I agree with above. Jerrell Belfast, MD   Margarita Rana, MD

## 2015-12-06 ENCOUNTER — Other Ambulatory Visit: Payer: Self-pay | Admitting: Family Medicine

## 2015-12-12 DIAGNOSIS — H353133 Nonexudative age-related macular degeneration, bilateral, advanced atrophic without subfoveal involvement: Secondary | ICD-10-CM | POA: Diagnosis not present

## 2015-12-19 DIAGNOSIS — S42291D Other displaced fracture of upper end of right humerus, subsequent encounter for fracture with routine healing: Secondary | ICD-10-CM | POA: Diagnosis not present

## 2015-12-26 DIAGNOSIS — E782 Mixed hyperlipidemia: Secondary | ICD-10-CM | POA: Diagnosis not present

## 2015-12-26 DIAGNOSIS — I481 Persistent atrial fibrillation: Secondary | ICD-10-CM | POA: Diagnosis not present

## 2015-12-26 DIAGNOSIS — I10 Essential (primary) hypertension: Secondary | ICD-10-CM | POA: Diagnosis not present

## 2015-12-26 DIAGNOSIS — I480 Paroxysmal atrial fibrillation: Secondary | ICD-10-CM | POA: Diagnosis not present

## 2015-12-26 DIAGNOSIS — I2510 Atherosclerotic heart disease of native coronary artery without angina pectoris: Secondary | ICD-10-CM | POA: Diagnosis not present

## 2015-12-26 DIAGNOSIS — I350 Nonrheumatic aortic (valve) stenosis: Secondary | ICD-10-CM | POA: Diagnosis not present

## 2015-12-26 DIAGNOSIS — I341 Nonrheumatic mitral (valve) prolapse: Secondary | ICD-10-CM | POA: Diagnosis not present

## 2016-01-15 ENCOUNTER — Ambulatory Visit (INDEPENDENT_AMBULATORY_CARE_PROVIDER_SITE_OTHER): Payer: Medicare Other | Admitting: Family Medicine

## 2016-01-15 ENCOUNTER — Encounter: Payer: Self-pay | Admitting: Family Medicine

## 2016-01-15 VITALS — BP 160/64 | HR 60 | Temp 98.2°F | Resp 16 | Ht 61.0 in | Wt 129.0 lb

## 2016-01-15 DIAGNOSIS — I10 Essential (primary) hypertension: Secondary | ICD-10-CM | POA: Diagnosis not present

## 2016-01-15 DIAGNOSIS — E785 Hyperlipidemia, unspecified: Secondary | ICD-10-CM

## 2016-01-15 DIAGNOSIS — Z0000 Encounter for general adult medical examination without abnormal findings: Secondary | ICD-10-CM

## 2016-01-15 DIAGNOSIS — E038 Other specified hypothyroidism: Secondary | ICD-10-CM

## 2016-01-15 DIAGNOSIS — E039 Hypothyroidism, unspecified: Secondary | ICD-10-CM

## 2016-01-15 NOTE — Progress Notes (Signed)
Patient ID: Marilyn Oconnell, female   DOB: 02-21-1924, 82 y.o.   MRN: [id number]       Patient: Marilyn Oconnell, Female    DOB: 24-Feb-1924, 82 y.o.   MRN: [id number] Visit Date: 01/15/2016  Today's Provider: Margarita Rana, MD   Chief Complaint  Patient presents with  . Medicare Wellness   Subjective:    Annual wellness visit Marilyn Oconnell is a 82 y.o. female. She feels well. She reports exercising none. She reports she is sleeping well. Has been having a tough time recently as her husband is in rehab. [place] and has to rely on people for transportation as she can not drive secondary to her vision.    08/08/09 Mammogram-BI-RADS 2 -----------------------------------------------------------   Review of Systems  Constitutional: Negative.   HENT: Negative.   Eyes: Negative.   Respiratory: Positive for shortness of breath.   Cardiovascular: Negative.   Gastrointestinal: Negative.   Endocrine: Negative.   Genitourinary: Negative.   Musculoskeletal: Negative.   Skin: Negative.   Allergic/Immunologic: Negative.   Neurological: Negative.   Hematological: Bruises/bleeds easily.  Psychiatric/Behavioral: Positive for sleep disturbance.    Social History   Social History  . Marital Status: Married    Spouse Name: N/A  . Number of Children: N/A  . Years of Education: grade 8   Occupational History  . Not on file.   Social History Main Topics  . Smoking status: Never Smoker   . Smokeless tobacco: Never Used  . Alcohol Use: No  . Drug Use: No  . Sexual Activity: Not on file   Other Topics Concern  . Not on file   Social History Narrative    Past Medical History  Diagnosis Date  . Hypertension   . Hypercholesteremia   . Coronary artery disease   . Arthritis   . COPD (chronic obstructive pulmonary disease) ([place])   . CHF (congestive heart failure) [facility])      Patient Active Problem List   Diagnosis Date Noted  . Chest pain 11/27/2015  .  Paroxysmal atrial fibrillation with RVR ([place]) 11/27/2015  . Dyspnea 11/27/2015  . Elevated troponin 11/27/2015  . Depression 11/27/2015  . Constipation 11/27/2015  . Fracture of left humerus 11/27/2015  . Humerus fracture 11/21/2015  . Adaptation reaction 09/28/2015  . Aortic heart valve narrowing 09/28/2015  . Benign hypertension 09/28/2015  . Bilateral cataracts 09/28/2015  . Cardiac murmur 09/28/2015  . Chronic kidney disease (CKD), stage III (moderate) 09/28/2015  . Clinical depression 09/28/2015  . Gout 09/28/2015  . 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/2015  . OP (osteoporosis) 09/28/2015  . Subclinical hypothyroidism 09/28/2015  . Atherosclerosis of coronary artery 09/08/2015  . Avitaminosis D 07/11/2015  . LBP (low back pain) 07/11/2015  . BP (high blood pressure) 07/11/2015  . Hyperlipemia 04/12/2015  . Billowing mitral valve 08/03/2014  . Arteriosclerosis of coronary artery 08/03/2014  . Fibrosis of lung ([place]) 05/25/2014  . Asthma-chronic obstructive pulmonary disease overlap syndrome ([place]) 05/25/2014  . H/O aortic valve replacement 11/09/2004    Past Surgical History  Procedure Laterality Date  . Shoulder surgery  09/1999  . Sigmoidoscopy  1992  . Dilation and curettage of uterus  1988  . Aortic valve replacement  2006    Her family history includes Aneurysm in her father; Heart failure in her brother; Lung cancer in her brother; Prostate cancer in her brother.    Previous Medications   ACETAMINOPHEN (TYLENOL) 500 MG TABLET    Take  1,000 mg by mouth 2 (two) times daily.   ALBUTEROL (PROVENTIL) (2.5 MG/3ML) 0.083% NEBULIZER SOLUTION    Take 2.5 mg by nebulization every 6 (six) hours as needed for wheezing or shortness of breath.   ASPIRIN EC 81 MG TABLET    Take 81 mg by mouth daily.   DOCUSATE SODIUM (COLACE) 100 MG CAPSULE    Take 1 capsule (100 mg total) by mouth daily as needed.   ESCITALOPRAM (LEXAPRO) 10 MG TABLET    Take 1 tablet (10 mg total) by mouth daily. 1/2 a  day for 7 days and then one a day.   FLUTICASONE-SALMETEROL (ADVAIR) 250-50 MCG/DOSE AEPB    Inhale 1 puff into the lungs 2 (two) times daily.   FUROSEMIDE (LASIX) 20 MG TABLET    TAKE 1 TABLET BY MOUTH ONCE DAILY.   METOPROLOL (LOPRESSOR) 100 MG TABLET    Take 100 mg by mouth 2 (two) times daily.   ONDANSETRON (ZOFRAN ODT) 4 MG DISINTEGRATING TABLET    Take 1 tablet (4 mg total) by mouth every 8 (eight) hours as needed for nausea or vomiting.   SIMVASTATIN (ZOCOR) 20 MG TABLET    Take 20 mg by mouth at bedtime.    Patient Care Team: Margarita Rana, MD as PCP - General (Family Medicine)     Objective:   Vitals: BP 160/64 mmHg  Pulse 60  Temp(Src) 98.2 F (36.8 C) (Oral)  Resp 16  Ht 5\' 1"  (1.549 m)  Wt 129 lb (58.514 kg)  BMI 24.39 kg/m2  SpO2 96%  Physical Exam  Constitutional: She is oriented to person, place, and time. She appears well-developed and well-nourished.  HENT:  Head: Normocephalic and atraumatic.  Right Ear: Tympanic membrane, external ear and ear canal normal.  Left Ear: Tympanic membrane, external ear and ear canal normal.  Nose: Nose normal.  Mouth/Throat: Uvula is midline, oropharynx is clear and moist and mucous membranes are normal.  Eyes: Conjunctivae, EOM and lids are normal. Pupils are equal, round, and reactive to light.  Neck: Trachea normal and normal range of motion. Neck supple. Carotid bruit is not present. No thyroid mass and no thyromegaly present.  Cardiovascular: Normal rate and regular rhythm.   Murmur heard.  Systolic murmur is present  Systolic ejection murmur  Pulmonary/Chest: Effort normal and breath sounds normal.  Abdominal: Soft. Normal appearance and bowel sounds are normal. There is no hepatosplenomegaly. There is no tenderness.  Musculoskeletal: Normal range of motion.  Lymphadenopathy:    She has no cervical adenopathy.    She has no axillary adenopathy.  Neurological: She is alert and oriented to person, place, and time. She  has normal strength. No cranial nerve deficit.  Skin: Skin is warm, dry and intact.  Psychiatric: She has a normal mood and affect. Her speech is normal and behavior is normal. Judgment and thought content normal. Cognition and memory are normal.    Activities of Daily Living In your present state of health, do you have any difficulty performing the following activities: 01/15/2016 11/27/2015  Hearing? Y N  Vision? Y Y  Difficulty concentrating or making decisions? Y N  Walking or climbing stairs? Y Y  Dressing or bathing? Y Y  Doing errands, shopping? Y Y    Fall Risk Assessment Fall Risk  01/15/2016  Falls in the past year? Yes  Number falls in past yr: 1  Injury with Fall? Yes     Depression Screen PHQ 2/9 Scores 01/15/2016  PHQ - 2  Score 4  PHQ- 9 Score 8    Cognitive Testing - 6-CIT  Correct? Score   What year is it? yes 0 0 or 4  What month is it? yes 0 0 or 3  Memorize:    Pia Mau,  44,  [address],  [place],      What time is it? (within 1 hour) yes 0 0 or 3  Count backwards from 20 yes 0 0, 2, or 4  Name the months of the year no 2 0, 2, or 4  Repeat name & address above no 3 0, 2, 4, 6, 8, or 10       TOTAL SCORE  5/28   Interpretation:  Normal  Normal (0-7) Abnormal (8-28)       Assessment & Plan:     Annual Wellness Visit  Reviewed patient's Family Medical History Reviewed and updated list of patient's medical providers Assessment of cognitive impairment was done Assessed patient's functional ability Established a written schedule for health screening [place] Completed and Reviewed  Exercise Activities and Dietary recommendations Goals    None      Immunization History  Administered Date(s) Administered  . Influenza-Unspecified 06/24/2015      1. Medicare annual wellness visit, subsequent As above.  2. Benign hypertension F/U pending lab report. - CBC with Differential/Platelet - Comprehensive metabolic  panel  3. Subclinical hypothyroidism - TSH  4. Hyperlipemia - Lipid Panel With LDL/HDL Ratio    Patient seen and examined by Dr. Jerrell Belfast, and note scribed by Philbert Riser. Dimas, CMA.  I have reviewed the document for accuracy and completeness and I agree with above. Jerrell Belfast, MD   Margarita Rana, MD   ------------------------------------------------------------------------------------------------------------

## 2016-01-16 ENCOUNTER — Ambulatory Visit: Payer: Medicare Other | Admitting: Family Medicine

## 2016-01-16 ENCOUNTER — Telehealth: Payer: Self-pay

## 2016-01-16 DIAGNOSIS — S42291D Other displaced fracture of upper end of right humerus, subsequent encounter for fracture with routine healing: Secondary | ICD-10-CM | POA: Diagnosis not present

## 2016-01-16 LAB — CBC WITH DIFFERENTIAL/PLATELET
Basophils Absolute: 0.1 10*3/uL (ref 0.0–0.2)
Basos: 0 %
EOS (ABSOLUTE): 0.1 10*3/uL (ref 0.0–0.4)
Eos: 1 %
Hematocrit: 35.4 % (ref 34.0–46.6)
Hemoglobin: 11.3 g/dL (ref 11.1–15.9)
Immature Grans (Abs): 0 10*3/uL (ref 0.0–0.1)
Immature Granulocytes: 0 %
Lymphocytes Absolute: 1.3 10*3/uL (ref 0.7–3.1)
Lymphs: 11 %
MCH: 29.4 pg (ref 26.6–33.0)
MCHC: 31.9 g/dL (ref 31.5–35.7)
MCV: 92 fL (ref 79–97)
Monocytes Absolute: 1.1 10*3/uL — ABNORMAL HIGH (ref 0.1–0.9)
Monocytes: 9 %
Neutrophils Absolute: 9.5 10*3/uL — ABNORMAL HIGH (ref 1.4–7.0)
Neutrophils: 79 %
Platelets: 256 10*3/uL (ref 150–379)
RBC: 3.85 x10E6/uL (ref 3.77–5.28)
RDW: 13.4 % (ref 12.3–15.4)
WBC: 11.9 10*3/uL — ABNORMAL HIGH (ref 3.4–10.8)

## 2016-01-16 LAB — LIPID PANEL WITH LDL/HDL RATIO
Cholesterol, Total: 168 mg/dL (ref 100–199)
HDL: 60 mg/dL (ref >39)
LDL Calculated: 87 mg/dL (ref 0–99)
LDl/HDL Ratio: 1.5 ratio units (ref 0.0–3.2)
Triglycerides: 104 mg/dL (ref 0–149)
VLDL Cholesterol Cal: 21 mg/dL (ref 5–40)

## 2016-01-16 LAB — COMPREHENSIVE METABOLIC PANEL
ALT: 15 IU/L (ref 0–32)
AST: 22 IU/L (ref 0–40)
Albumin/Globulin Ratio: 1.4 (ref 1.2–2.2)
Albumin: 3.9 g/dL (ref 3.5–4.7)
Alkaline Phosphatase: 86 IU/L (ref 39–117)
BUN/Creatinine Ratio: 15 (ref 12–28)
BUN: 15 mg/dL (ref 8–27)
Bilirubin Total: 0.4 mg/dL (ref 0.0–1.2)
CO2: 29 mmol/L (ref 18–29)
Calcium: 8.9 mg/dL (ref 8.7–10.3)
Chloride: 97 mmol/L (ref 96–106)
Creatinine, Ser: 0.98 mg/dL (ref 0.57–1.00)
GFR calc Af Amer: 59 mL/min/{1.73_m2} — ABNORMAL LOW (ref >59)
GFR calc non Af Amer: 51 mL/min/{1.73_m2} — ABNORMAL LOW (ref >59)
Globulin, Total: 2.8 g/dL (ref 1.5–4.5)
Glucose: 95 mg/dL (ref 65–99)
Potassium: 4.3 mmol/L (ref 3.5–5.2)
Sodium: 144 mmol/L (ref 134–144)
Total Protein: 6.7 g/dL (ref 6.0–8.5)

## 2016-01-16 LAB — TSH: TSH: 3.12 u[IU]/mL (ref 0.450–4.500)

## 2016-01-16 NOTE — Telephone Encounter (Signed)
[id number] 

## 2016-01-16 NOTE — Telephone Encounter (Signed)
-----   Message from Margarita Rana, MD sent at 01/16/2016  2:00 PM EDT ----- Labs stable. Already notified her niece. Please call patient and let her know. Thanks.

## 2016-01-20 ENCOUNTER — Emergency Department
Admission: EM | Admit: 2016-01-20 | Discharge: 2016-01-20 | Disposition: A | Discharge status: Home / Self Care | Payer: Medicare Other | Attending: Emergency Medicine | Admitting: Emergency Medicine

## 2016-01-20 ENCOUNTER — Encounter: Payer: Self-pay | Admitting: Emergency Medicine

## 2016-01-20 ENCOUNTER — Emergency Department: Payer: Medicare Other

## 2016-01-20 DIAGNOSIS — Y999 Unspecified external cause status: Secondary | ICD-10-CM | POA: Insufficient documentation

## 2016-01-20 DIAGNOSIS — S3992XA Unspecified injury of lower back, initial encounter: Secondary | ICD-10-CM | POA: Diagnosis not present

## 2016-01-20 DIAGNOSIS — I10 Essential (primary) hypertension: Secondary | ICD-10-CM | POA: Diagnosis not present

## 2016-01-20 DIAGNOSIS — S60221A Contusion of right hand, initial encounter: Secondary | ICD-10-CM | POA: Diagnosis not present

## 2016-01-20 DIAGNOSIS — W19XXXA Unspecified fall, initial encounter: Secondary | ICD-10-CM | POA: Diagnosis not present

## 2016-01-20 DIAGNOSIS — Z79899 Other long term (current) drug therapy: Secondary | ICD-10-CM | POA: Diagnosis not present

## 2016-01-20 DIAGNOSIS — S41111A Laceration without foreign body of right upper arm, initial encounter: Secondary | ICD-10-CM | POA: Diagnosis not present

## 2016-01-20 DIAGNOSIS — Y929 Unspecified place or not applicable: Secondary | ICD-10-CM | POA: Insufficient documentation

## 2016-01-20 DIAGNOSIS — W01190A Fall on same level from slipping, tripping and stumbling with subsequent striking against furniture, initial encounter: Secondary | ICD-10-CM | POA: Diagnosis not present

## 2016-01-20 DIAGNOSIS — S6991XA Unspecified injury of right wrist, hand and finger(s), initial encounter: Secondary | ICD-10-CM | POA: Diagnosis not present

## 2016-01-20 DIAGNOSIS — E7800 Pure hypercholesterolemia, unspecified: Secondary | ICD-10-CM | POA: Diagnosis not present

## 2016-01-20 DIAGNOSIS — N183 Chronic kidney disease, stage 3 (moderate): Secondary | ICD-10-CM | POA: Insufficient documentation

## 2016-01-20 DIAGNOSIS — S41102A Unspecified open wound of left upper arm, initial encounter: Secondary | ICD-10-CM | POA: Diagnosis not present

## 2016-01-20 DIAGNOSIS — J449 Chronic obstructive pulmonary disease, unspecified: Secondary | ICD-10-CM | POA: Insufficient documentation

## 2016-01-20 DIAGNOSIS — I130 Hypertensive heart and chronic kidney disease with heart failure and stage 1 through stage 4 chronic kidney disease, or unspecified chronic kidney disease: Secondary | ICD-10-CM | POA: Insufficient documentation

## 2016-01-20 DIAGNOSIS — Z23 Encounter for immunization: Secondary | ICD-10-CM | POA: Insufficient documentation

## 2016-01-20 DIAGNOSIS — Y939 Activity, unspecified: Secondary | ICD-10-CM | POA: Diagnosis not present

## 2016-01-20 DIAGNOSIS — Z7982 Long term (current) use of aspirin: Secondary | ICD-10-CM | POA: Diagnosis not present

## 2016-01-20 DIAGNOSIS — R51 Headache: Secondary | ICD-10-CM | POA: Insufficient documentation

## 2016-01-20 DIAGNOSIS — I2510 Atherosclerotic heart disease of native coronary artery without angina pectoris: Secondary | ICD-10-CM | POA: Diagnosis not present

## 2016-01-20 DIAGNOSIS — S51811A Laceration without foreign body of right forearm, initial encounter: Secondary | ICD-10-CM | POA: Insufficient documentation

## 2016-01-20 DIAGNOSIS — I509 Heart failure, unspecified: Secondary | ICD-10-CM | POA: Diagnosis not present

## 2016-01-20 DIAGNOSIS — M79641 Pain in right hand: Secondary | ICD-10-CM | POA: Diagnosis not present

## 2016-01-20 DIAGNOSIS — S0990XA Unspecified injury of head, initial encounter: Secondary | ICD-10-CM | POA: Diagnosis not present

## 2016-01-20 IMAGING — CR DG LUMBAR SPINE 2-3V
1 series · 3 of 3 positions shown · non-contrast
Comparison: Lumbar spine plain film dated [DATE].

CLINICAL DATA: Status post fall

EXAM:
LUMBAR SPINE - 2-3 VIEW

[Series 1: dg lumbar spine 2-3 views · 0.14mm/px · 3 of 3 slices shown]
[im 1/3]
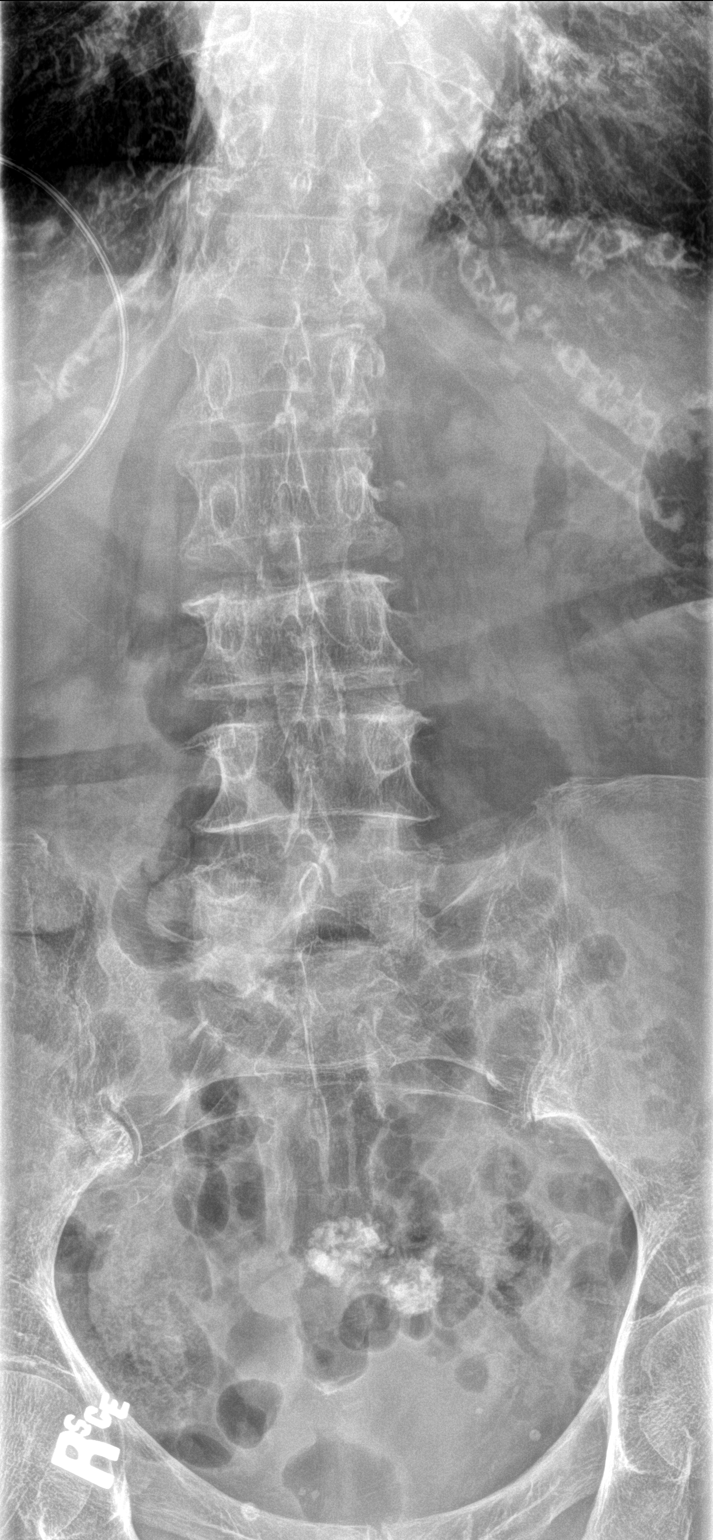
[im 2/3]
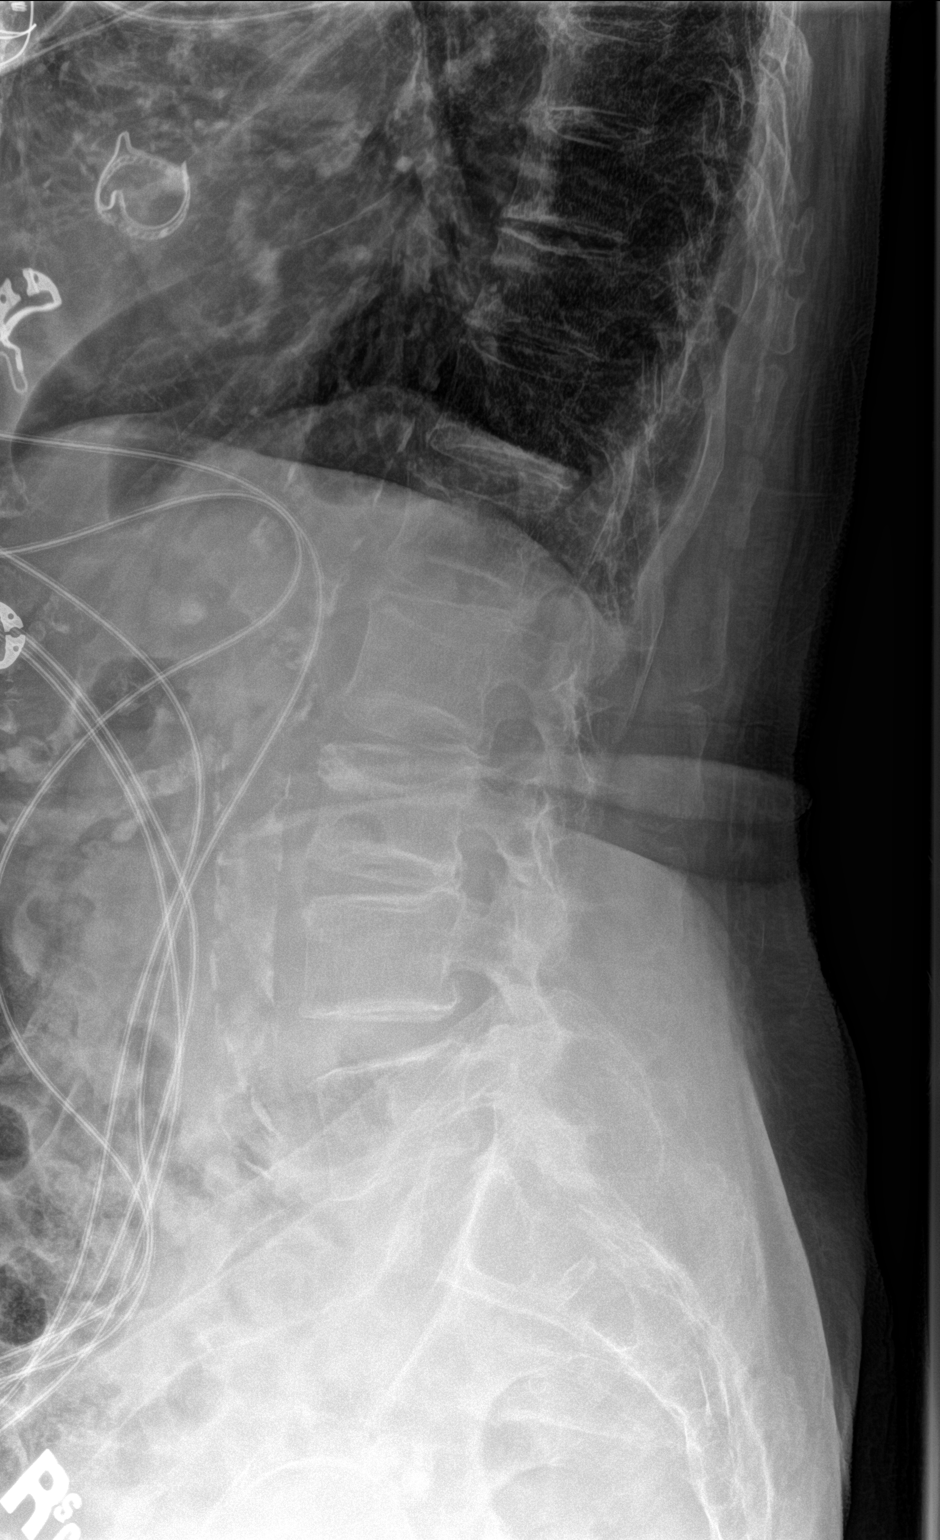
[im 3/3]
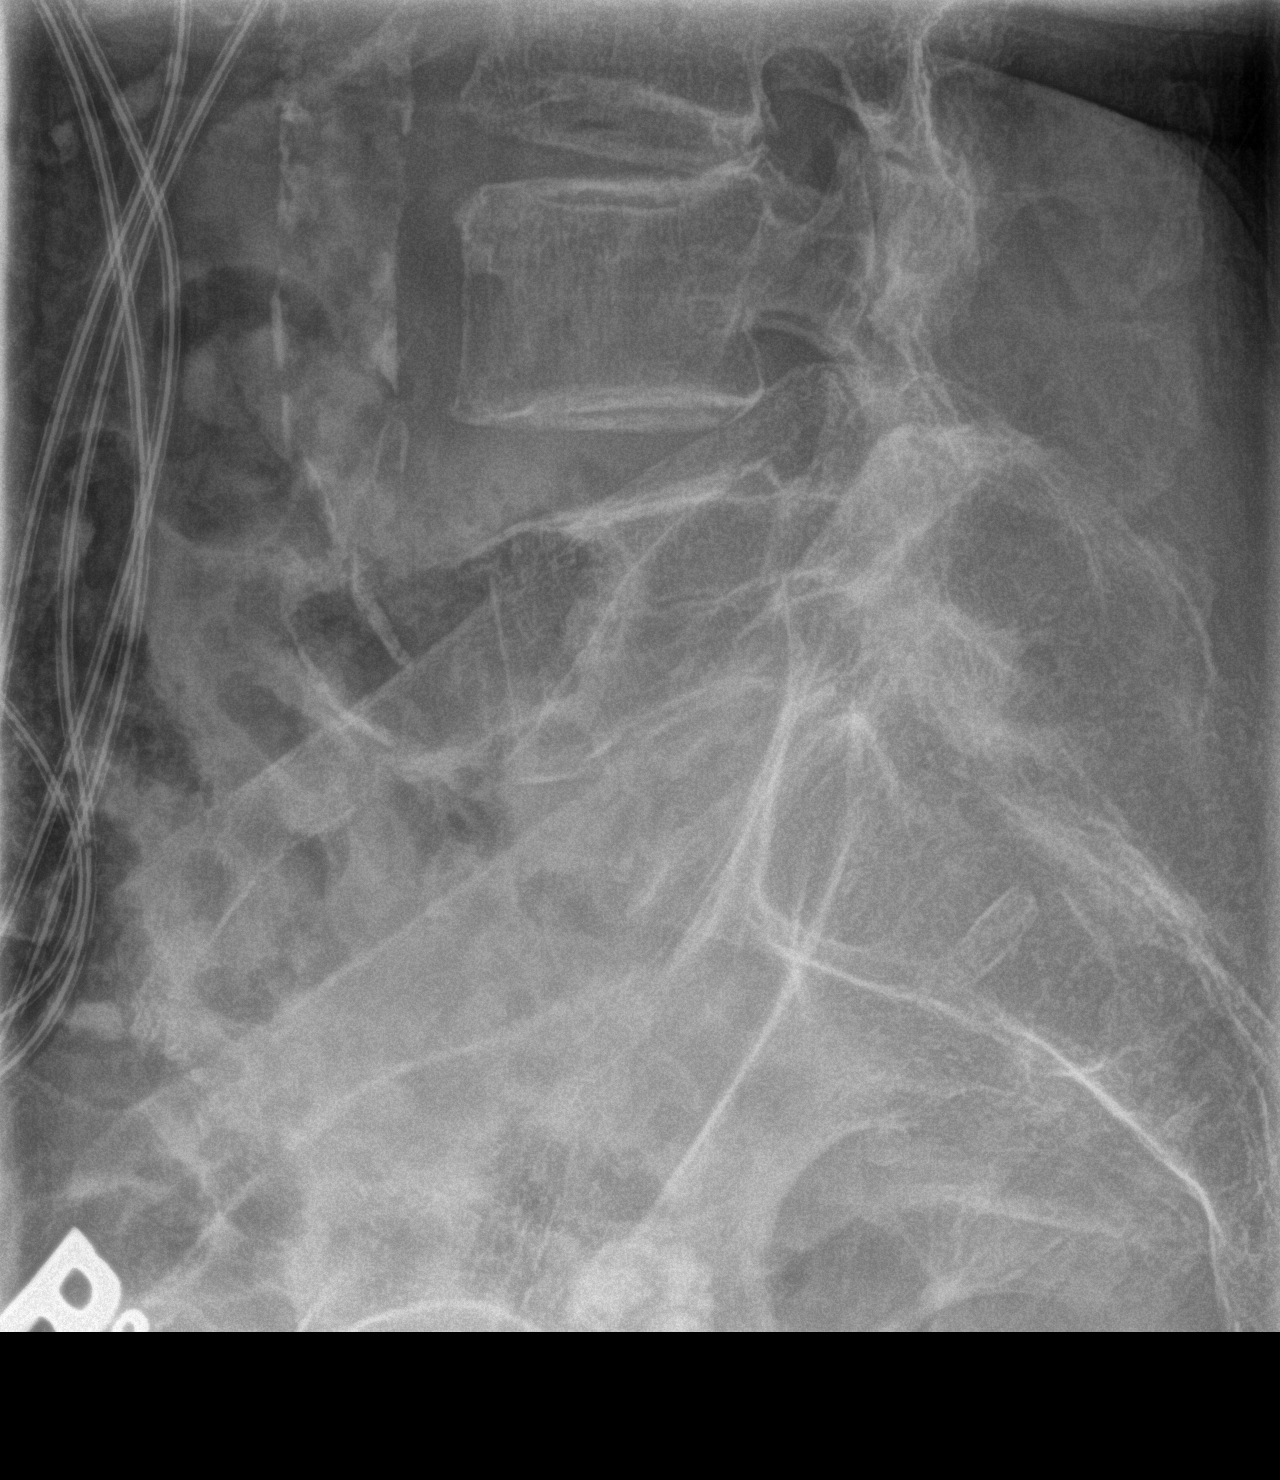

[3 of 3 positions shown; findings below may reference images not displayed]

FINDINGS: There is a mild wedge compression deformity of the L3 vertebral body
which is of uncertain age but not seen on the previous exam,
possibly accentuated by patient positioning. Mild compression
deformity of the L1 vertebral body is grossly stable. Questionable
minimal compression deformity involving the superior endplate of the
L2 vertebral body is also of uncertain age but not seen on the
previous exam, again possibly accentuated by patient positioning.
Based on the AP projection, there is no significant change except
for increased dextroscoliosis of the lumbar spine which is likely
contributing to the appearance on the lateral view.

No fracture line or displaced fracture fragment is seen. Slightly
increased dextroscoliosis of the lumbar spine, as detailed above.
Overall alignment otherwise unchanged. Posterior elements appear
normally aligned. Upper sacrum appears intact and normally aligned,
although difficult to definitively characterize due to overlying
bowel gas.

Calcifications within the lower pelvis are stable, presumed
calcified uterine fibroids. Visualized paravertebral and intrapelvic
soft tissues are otherwise unremarkable.
IMPRESSION: 1. Mild wedge compression deformities of the L1 through L3 vertebral
bodies, likely accentuated by patient positioning and/or increased
dextroscoliosis on the lateral view, not significantly changed in
appearance on the AP view, each favored to be chronic.
2. Overall, no acute-appearing abnormality seen.

## 2016-01-20 IMAGING — CR DG HAND COMPLETE 3+V*R*
1 series · 3 of 3 positions shown · non-contrast
Comparison: None.

CLINICAL DATA: 89-year-old female with a history of fall and hand
pain

EXAM:
RIGHT HAND - COMPLETE 3+ VIEW

[Series 1: dg hand complete right · 0.14mm/px · 3 of 3 slices shown]
[im 1/3]
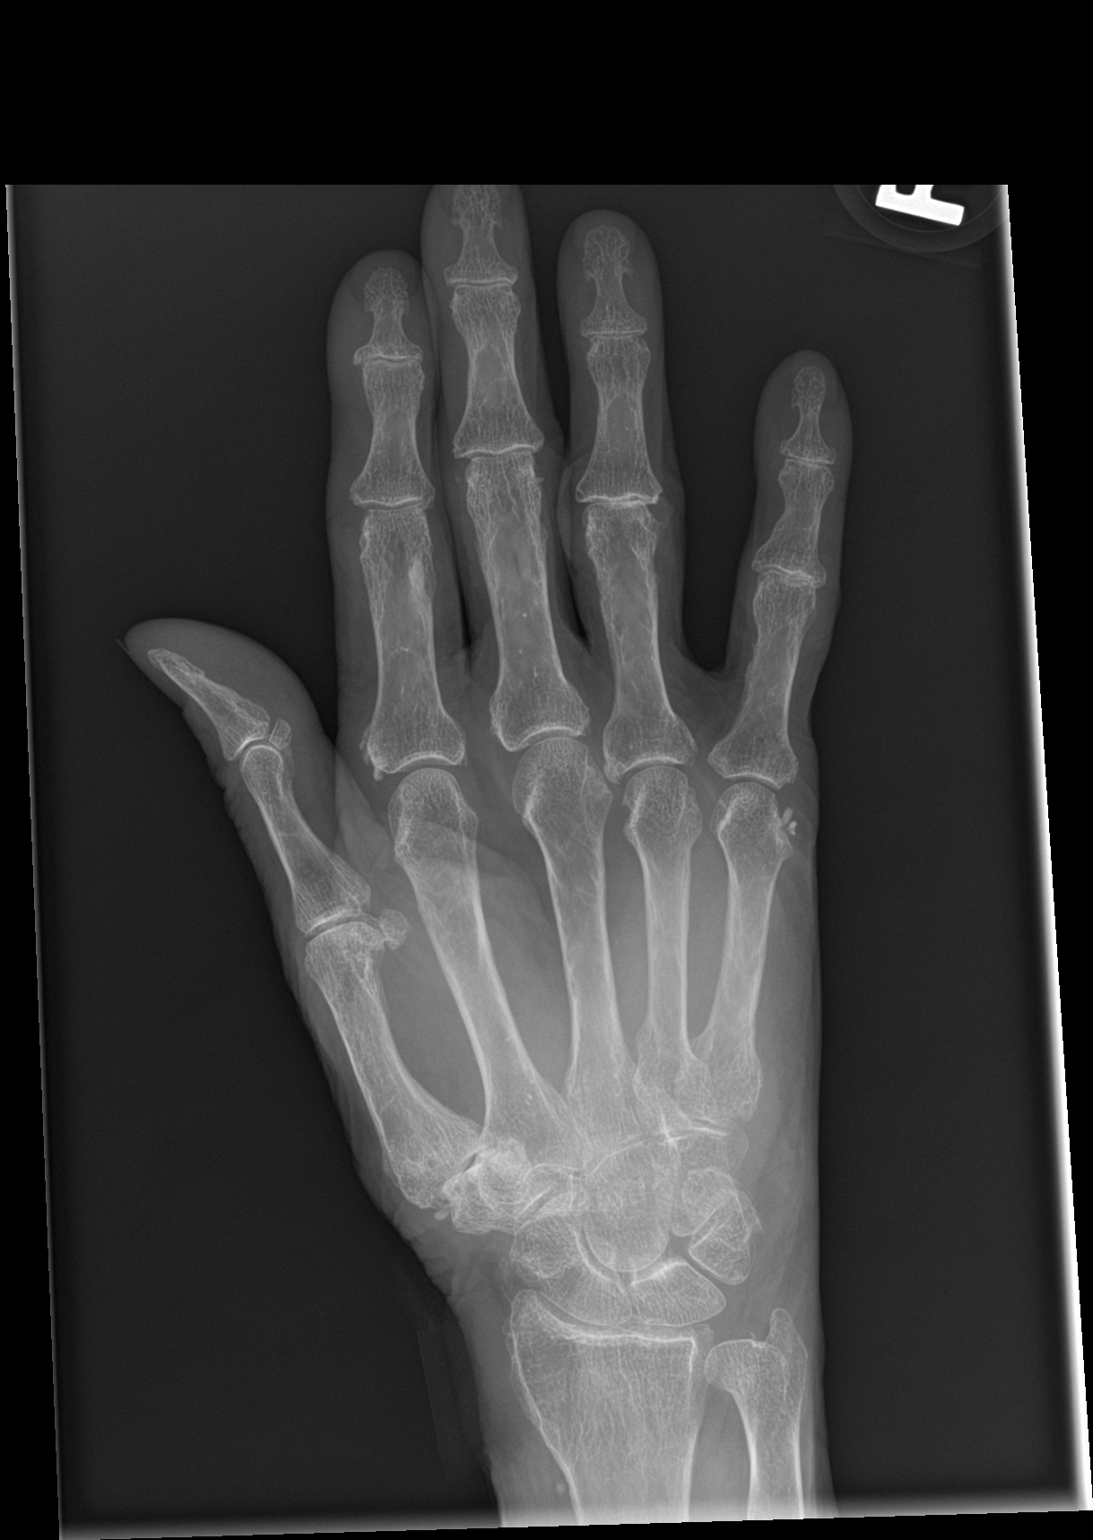
[im 2/3]
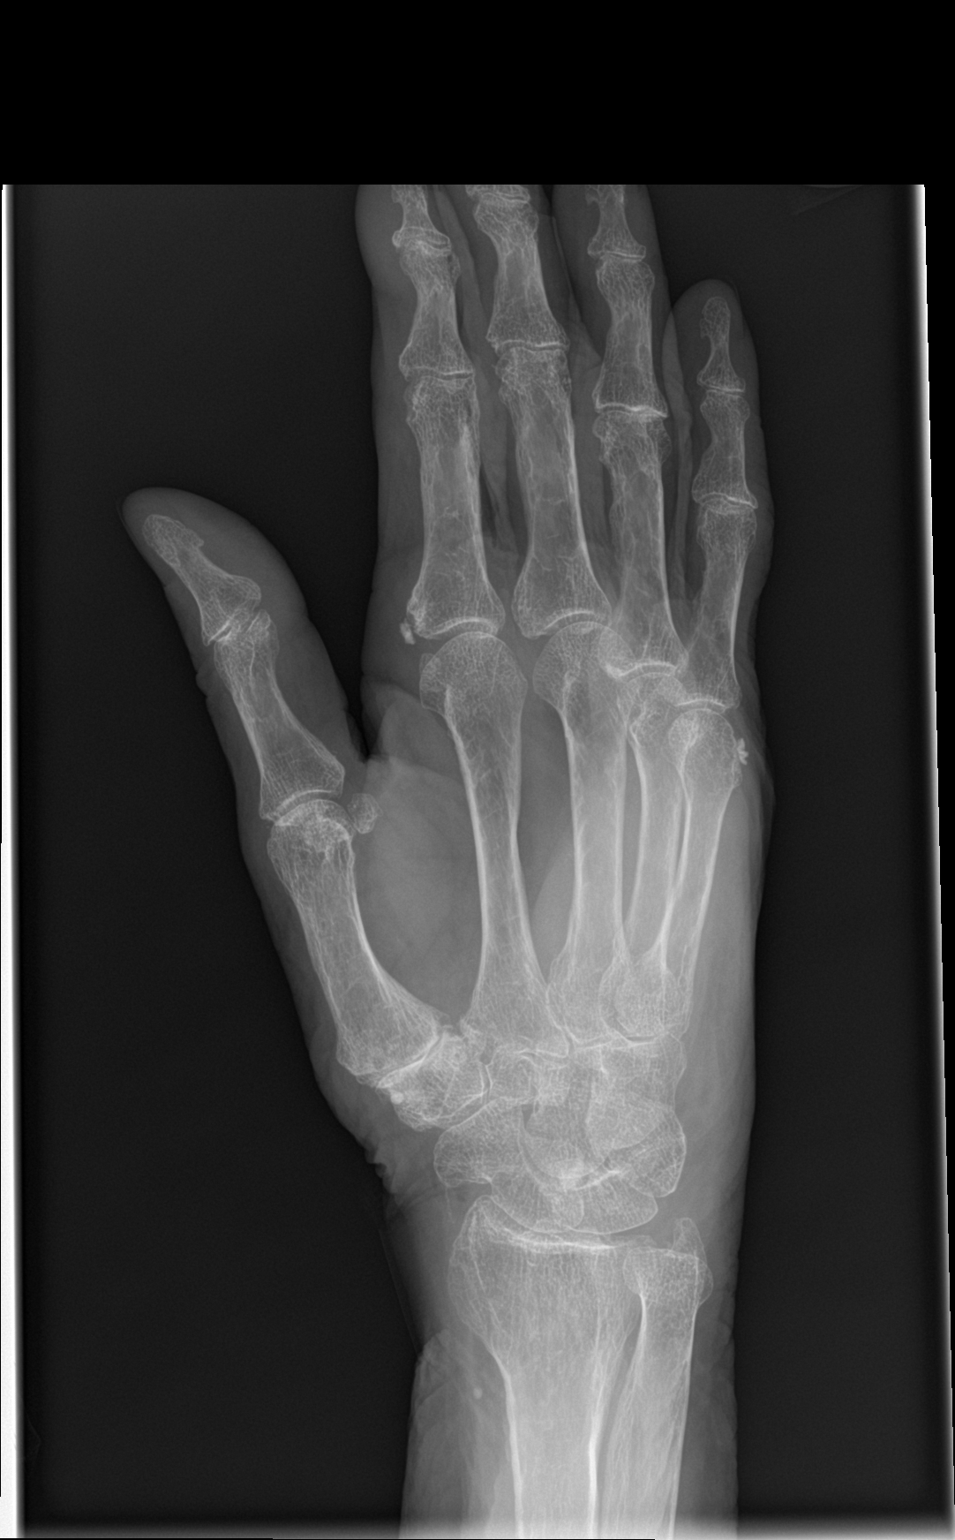
[im 3/3]
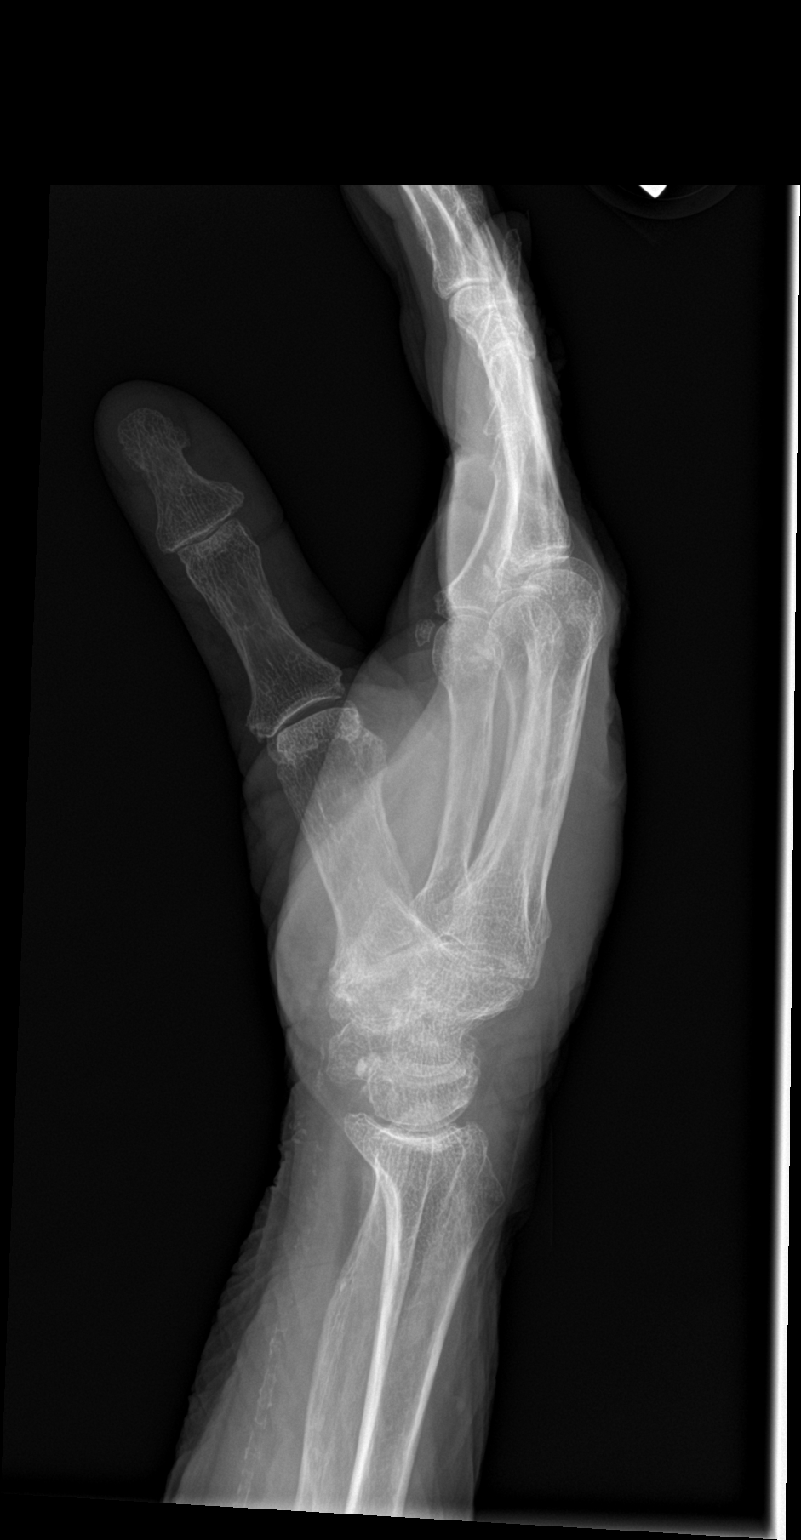

[3 of 3 positions shown; findings below may reference images not displayed]

FINDINGS: Diffuse osteopenia.  No displaced fracture identified.

Osteoarthritis of the wrist, first metacarpophalangeal joint, and
the phalangeal joints of the hand.

No radiopaque foreign body.
IMPRESSION: Negative for acute bony abnormality.

Osteoarthritis of the wrist, interphalangeal joints, and first
carpometacarpal joint.

## 2016-01-20 IMAGING — CT CT HEAD W/O CM
2 of 3 series · 13 of 30 positions shown, 15 images · non-contrast
Comparison: None.

CLINICAL DATA: 89-year-old female with a history of fall

EXAM:
CT HEAD WITHOUT CONTRAST
TECHNIQUE: Contiguous axial images were obtained from the base of the skull
through the vertex without intravenous contrast.

[Series 2: head wo · axial · 0.41mm/px · z∈[-82,+26]mm · 5 of 36 slices shown, 7 images]
[im 6/36  brain]
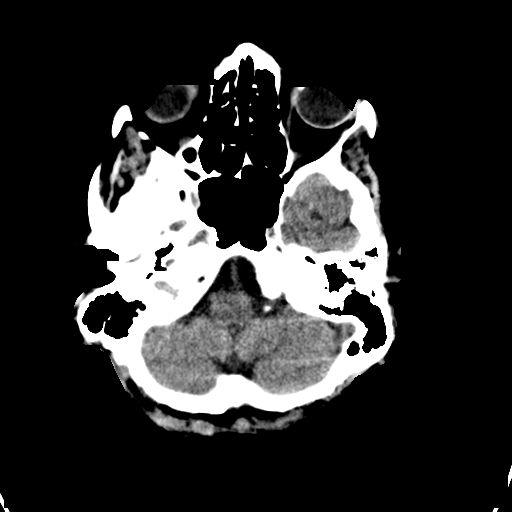
[im 6/36  bone]
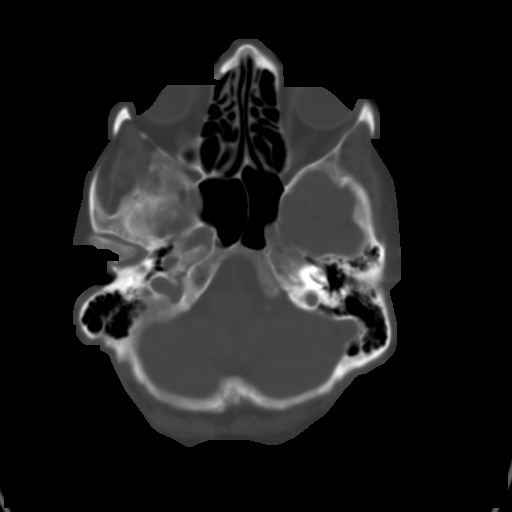
[im 12/36  brain]
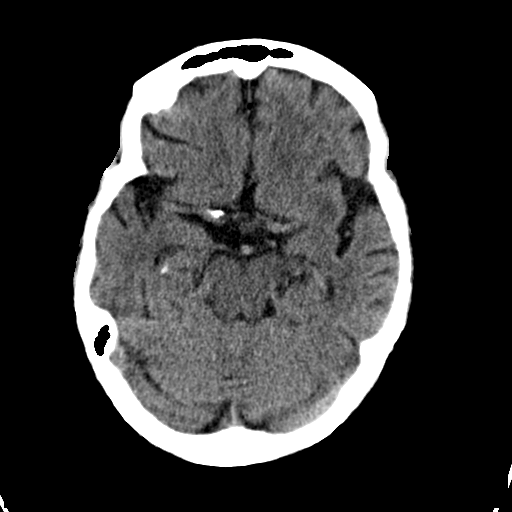
[im 18/36  brain]
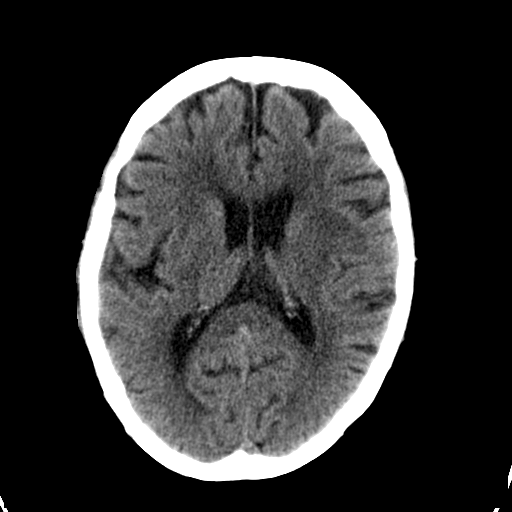
[im 24/36  brain]
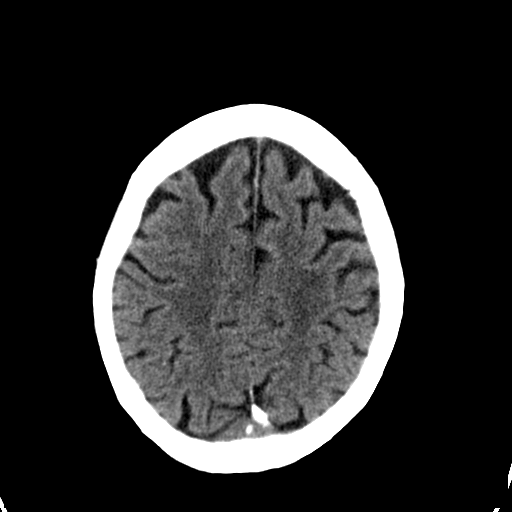
[im 30/36  brain]
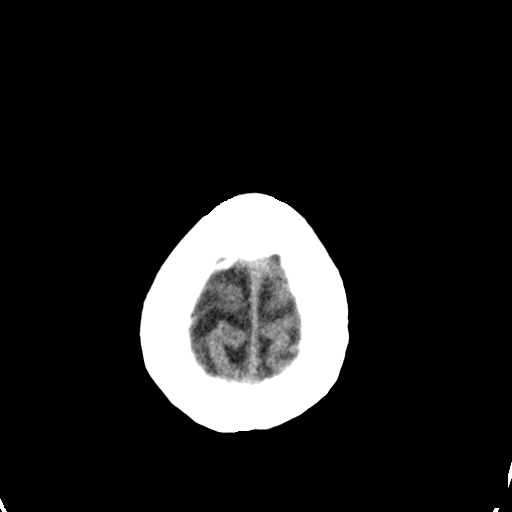
[im 30/36  bone]
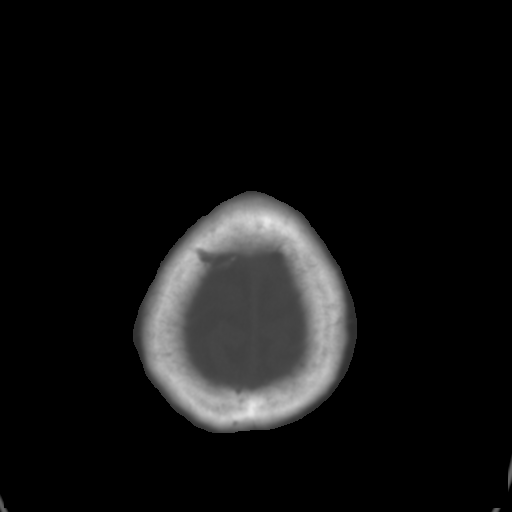

[Series 3: head bone · axial · 0.41mm/px · z∈[-91,+38]mm · 8 of 108 slices shown]
[im 11/108  bone]
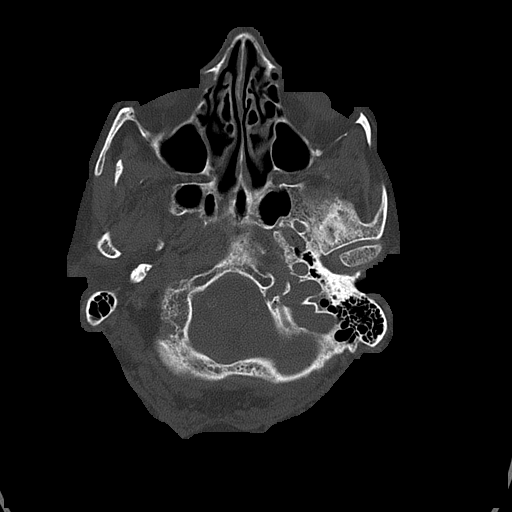
[im 22/108  bone]
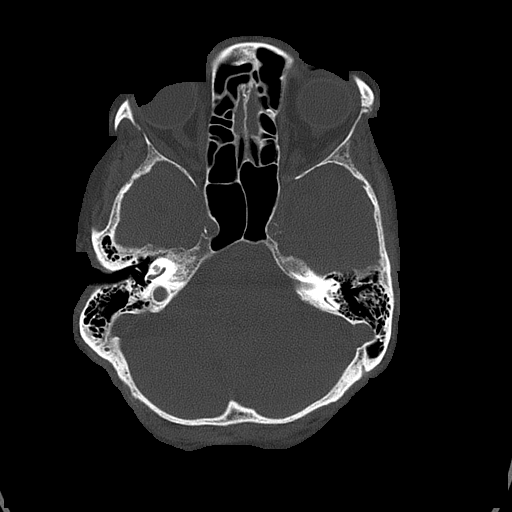
[im 33/108  bone]
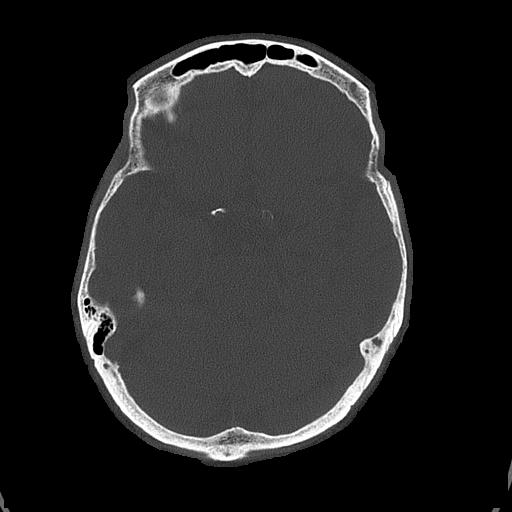
[im 49/108  bone]
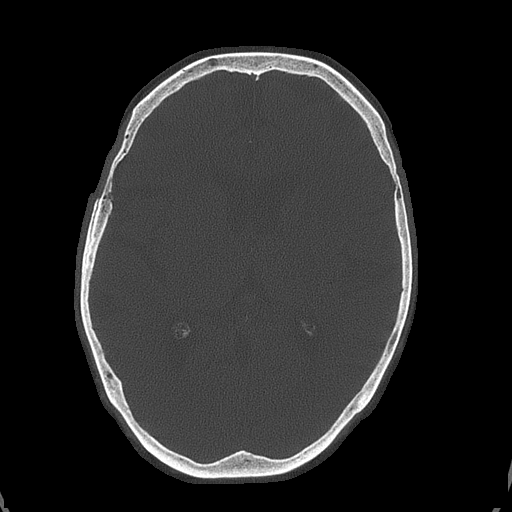
[im 59/108  bone]
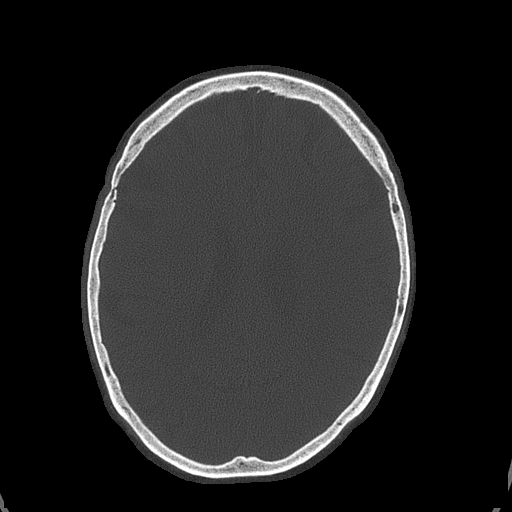
[im 75/108  bone]
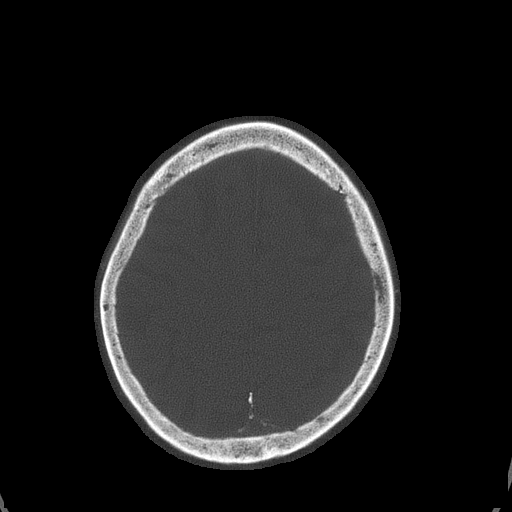
[im 86/108  bone]
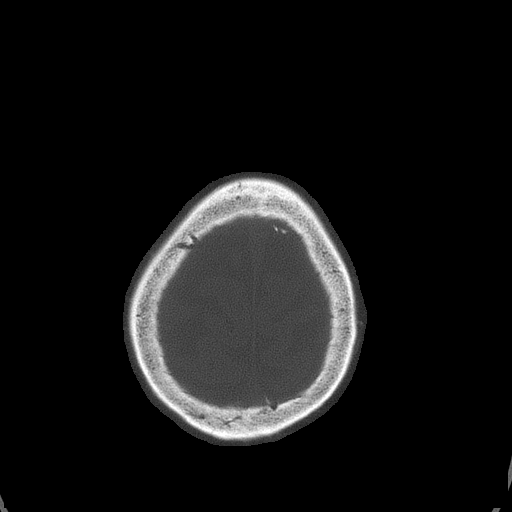
[im 97/108  bone]
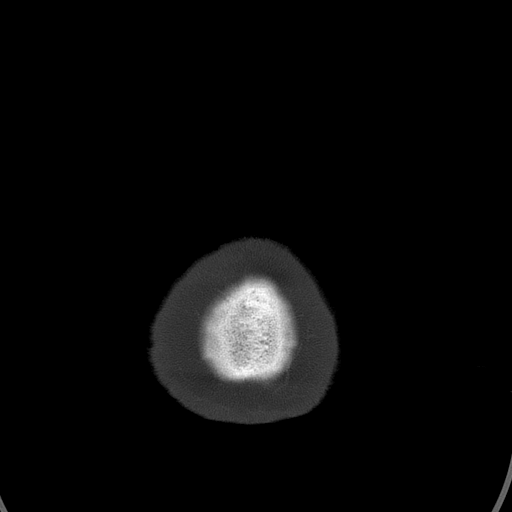

[13 of 30 positions shown; findings below may reference images not displayed]

FINDINGS: Unremarkable appearance of the calvarium without acute fracture or
aggressive lesion.

Unremarkable appearance of the scalp soft tissues.

Bilateral lens extraction.

Mastoid air cells are clear.

No significant paranasal sinus disease

No acute intracranial hemorrhage.  No midline shift or mass effect.

Senescent basal ganglia calcifications.

Mild brain volume loss.  Unremarkable configuration the ventricles.

Gray-white differentiation maintained. Patchy hypodensity in the
periventricular white matter.

Calcifications of the intracranial vasculature.
IMPRESSION: No CT evidence of acute intracranial abnormality.

Evidence of chronic microvascular ischemic disease with intracranial
atherosclerosis.

## 2016-01-20 MED ORDER — ACETAMINOPHEN 500 MG PO TABS
500.0000 mg | ORAL_TABLET | Freq: Once | ORAL | Status: AC
  Administered 2016-01-20: 500 mg via ORAL

## 2016-01-20 MED ORDER — ACETAMINOPHEN 500 MG PO TABS
ORAL_TABLET | ORAL | Status: AC
  Administered 2016-01-20: 500 mg via ORAL
  Filled 2016-01-20: qty 1

## 2016-01-20 MED ORDER — TETANUS-DIPHTH-ACELL PERTUSSIS 5-2.5-18.5 LF-MCG/0.5 IM SUSP
INTRAMUSCULAR | Status: AC
  Administered 2016-01-20: 0.5 mL via INTRAMUSCULAR
  Filled 2016-01-20: qty 0.5

## 2016-01-20 MED ORDER — TETANUS-DIPHTH-ACELL PERTUSSIS 5-2.5-18.5 LF-MCG/0.5 IM SUSP
0.5000 mL | Freq: Once | INTRAMUSCULAR | Status: AC
  Administered 2016-01-20: 0.5 mL via INTRAMUSCULAR

## 2016-01-20 NOTE — ED Notes (Signed)
This RN notified by MD Reita Cliche that the wrong name was on the D/C paperwork but with the correct discharge information. Attempted to call pt to inform them of mistake but unable to reach pt.

## 2016-01-20 NOTE — ED Provider Notes (Signed)
[facility] Emergency Department Provider Note   ____________________________________________  Time seen:  I have reviewed the triage vital signs and the triage nursing note.  HISTORY  Chief Complaint Fall   Historian Patient and niece, hcpoa  HPI Marilyn Oconnell is a 82 y.o. female who lives alone, and just recently had her husband placed into a facility, and so has been waking up early to try to get there before breakfast to take care of him. She is nearly blind from macular degeneration, and has trouble in unfamiliar areas, and this morning was reportedly coming around the bed when she tripped and fell. Does not sound like it was a syncope. She thinks she may have struck her head.. No headache. She did have trouble getting up when she was on the ground, called for help.  Niece states that she's been under a lot of stress recently having placed her husband in a facility, and exhausting herself. This morning she woke up at 4:30 in order to prepare to go over to the nursing home. She did not have breakfast before she went over there.    Past Medical History  Diagnosis Date  . Hypertension   . Hypercholesteremia   . Coronary artery disease   . Arthritis   . COPD (chronic obstructive pulmonary disease) ([place])   . CHF (congestive heart failure) [facility])     Patient Active Problem List   Diagnosis Date Noted  . Chest pain 11/27/2015  . Paroxysmal atrial fibrillation with RVR ([place]) 11/27/2015  . Dyspnea 11/27/2015  . Elevated troponin 11/27/2015  . Depression 11/27/2015  . Constipation 11/27/2015  . Fracture of left humerus 11/27/2015  . Humerus fracture 11/21/2015  . Adaptation reaction 09/28/2015  . Aortic heart valve narrowing 09/28/2015  . Benign hypertension 09/28/2015  . Bilateral cataracts 09/28/2015  . Cardiac murmur 09/28/2015  . Chronic kidney disease (CKD), stage III (moderate) 09/28/2015  . Clinical depression 09/28/2015  . Gout 09/28/2015   . 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/2015  . OP (osteoporosis) 09/28/2015  . Subclinical hypothyroidism 09/28/2015  . Atherosclerosis of coronary artery 09/08/2015  . Avitaminosis D 07/11/2015  . LBP (low back pain) 07/11/2015  . BP (high blood pressure) 07/11/2015  . Hyperlipemia 04/12/2015  . Billowing mitral valve 08/03/2014  . Arteriosclerosis of coronary artery 08/03/2014  . Fibrosis of lung ([place]) 05/25/2014  . Asthma-chronic obstructive pulmonary disease overlap syndrome ([place]) 05/25/2014  . H/O aortic valve replacement 11/09/2004    Past Surgical History  Procedure Laterality Date  . Shoulder surgery  09/1999  . Sigmoidoscopy  1992  . Dilation and curettage of uterus  1988  . Aortic valve replacement  2006    Current Outpatient Rx  Name  Route  Sig  Dispense  Refill  . acetaminophen (TYLENOL) 500 MG tablet   Oral   Take 500-1,000 mg by mouth 2 (two) times daily as needed for mild pain, moderate pain, fever or headache.          . albuterol (PROVENTIL) (2.5 MG/3ML) 0.083% nebulizer solution   Nebulization   Take 2.5 mg by nebulization every 6 (six) hours as needed for wheezing or shortness of breath.         Marland Kitchen aspirin EC 81 MG tablet   Oral   Take 81 mg by mouth daily.         Marland Kitchen docusate sodium (COLACE) 100 MG capsule   Oral   Take 1 capsule (100 mg total) by mouth daily as needed.  30 capsule   2   . escitalopram (LEXAPRO) 10 MG tablet   Oral   Take 1 tablet (10 mg total) by mouth daily. 1/2 a day for 7 days and then one a day.   30 tablet   3   . Fluticasone-Salmeterol (ADVAIR) 250-50 MCG/DOSE AEPB   Inhalation   Inhale 1 puff into the lungs 2 (two) times daily.         . furosemide (LASIX) 20 MG tablet      TAKE 1 TABLET BY MOUTH ONCE DAILY.   60 tablet   5   . metoprolol (LOPRESSOR) 100 MG tablet   Oral   Take 100 mg by mouth 2 (two) times daily.         . simvastatin (ZOCOR) 20 MG tablet   Oral   Take 20 mg by mouth at bedtime.          . ondansetron (ZOFRAN ODT) 4 MG disintegrating tablet   Oral   Take 1 tablet (4 mg total) by mouth every 8 (eight) hours as needed for nausea or vomiting.   20 tablet   0     Allergies Iodine; Shellfish allergy; Azithromycin; and Sulfa antibiotics  Family History  Problem Relation Age of Onset  . Aneurysm Father   . Lung cancer Brother   . Prostate cancer Brother   . Heart failure Brother     Social History Social History  Substance Use Topics  . Smoking status: Never Smoker   . Smokeless tobacco: Never Used  . Alcohol Use: No    Review of Systems  Constitutional: Negative for Recent illnesses. Eyes: Chronic blindness from macular degeneration. ENT: Negative for URI symptoms. Cardiovascular: Negative for chest pain. Respiratory: Negative for shortness of breath. Gastrointestinal: Negative for abdominal pain, vomiting and diarrhea. Genitourinary: Negative for dysuria. Musculoskeletal: Negative for neck pain. Positive for low back pain. Skin: Negative for rash. Neurological: Negative for headache. 10 point Review of Systems otherwise negative ____________________________________________   PHYSICAL EXAM:  VITAL SIGNS: ED Triage Vitals  Enc Vitals Group     BP 01/20/16 1013 204/78 mmHg     Pulse Rate 01/20/16 1013 75     Resp 01/20/16 1013 16     Temp 01/20/16 1013 98.3 F (36.8 C)     Temp Source 01/20/16 1013 Oral     SpO2 01/20/16 1013 97 %     Weight 01/20/16 1013 129 lb (58.514 kg)     Height 01/20/16 1013 5\' 1"  (1.549 m)     Head Cir --      Peak Flow --      Pain Score 01/20/16 1014 7     Pain Loc --      Pain Edu? --      Excl. in [place]? --      Constitutional: Alert and oriented. Well appearing and in no distress. HEENT   Head: Normocephalic and atraumatic.      Eyes: Conjunctivae are normal. PERRL. Normal extraocular movements.      Ears:         Nose: No congestion/rhinnorhea.   Mouth/Throat: Mucous membranes are moist.   Neck:  No stridor.No posterior midline C-spine tenderness Cardiovascular/Chest: Normal rate, regular rhythm.  No murmurs, rubs, or gallops. Respiratory: Normal respiratory effort without tachypnea nor retractions. Breath sounds are clear and equal bilaterally. No wheezes/rales/rhonchi. Gastrointestinal: Soft. No distention, no guarding, no rebound. Nontender.    Genitourinary/rectal:Deferred Musculoskeletal: Mild tenderness lumbar spine. Nontender with normal range  of motion in all extremities. No joint effusions.  No lower extremity tenderness.  No edema. Right hand ecchymosis Neurologic:  Normal speech and language. No gross or focal neurologic deficits are appreciated. Skin:  Skin is warm, dry. No rash noted.  Right forearm with V-shaped deep skin tear/laceration. No bony point tenderness of the right arm. Some mild tenderness of the left arm, where she has a known healing fracture, no new pain there. Psychiatric: Mood and affect are normal. Speech and behavior are normal. Patient exhibits appropriate insight and judgment.  ____________________________________________   EKG I, Lisa Roca, MD, the attending physician have personally viewed and interpreted all ECGs.  None ____________________________________________  LABS (pertinent positives/negatives)  None  ____________________________________________  RADIOLOGY All Xrays were viewed by me. Imaging interpreted by Radiologist.  Lumbar spine 2 view:  IMPRESSION: 1. Mild wedge compression deformities of the L1 through L3 vertebral bodies, likely accentuated by patient positioning and/or increased dextroscoliosis on the lateral view, not significantly changed in appearance on the AP view, each favored to be chronic. 2. Overall, no acute-appearing abnormality seen.  Hand right complete:  IMPRESSION: Negative for acute bony abnormality.  Osteoarthritis of the wrist, interphalangeal joints, and first carpometacarpal joint.  CT head  without contrast:IMPRESSION: No CT evidence of acute intracranial abnormality.  Evidence of chronic microvascular ischemic disease with intracranial atherosclerosis. __________________________________________  PROCEDURES  Procedure(s) performed: LACERATION REPAIR Performed by: Lisa Roca Authorized by: Lisa Roca Consent: Verbal consent obtained.  Consent given by: patient   Wound explored  Laceration Location: right arm  Laceration Length: 8 cm  No Foreign Bodies seen or palpated  Anesthesia: local infiltration  Irrigation method: syringe Amount of cleaning: standard  Skin closure: steri strips   Patient tolerance: Patient tolerated the procedure well with no immediate complications.   Critical Care performed: None  ____________________________________________   ED COURSE / ASSESSMENT AND PLAN  Pertinent labs & imaging results that were available during my care of the patient were reviewed by me and considered in my medical decision making (see chart for details).   In terms of the fall, it sounds like it was probably mechanical due to vision issues, rather then syncope.  I discussed with the niece and the patient about whether or not to obtain any evaluation for medical/metabolic issues including anemia or urinary tract infection, etc., and they felt like this was a simple case of mechanical fall.  In terms of traumatic injuries, she had no C-spine tenderness. Later on she was complaining of some mild right sided soreness to her neck, but nothing in the posterior midline C-spine.  She has an ecchymosis to her right hand, but x-ray showed no underlying fracture.  She had a skin tear to her right forearm, but no clinical bony tenderness. This was repaired with Steri-Strips, crackles concerned about suturing given her extremely thin skin.  She was unsure when her last tetanus was updated, and so she was given a booster today.  We discussed the lumbar  spine x-ray showing compression fractures that appear to be chronic. She is having some pain in this area, and so conservative pain management for possible acute lumbar fracture is appropriate.    CONSULTATIONS:   None   Patient / Family / Caregiver informed of clinical course, medical decision-making process, and agree with plan.   I discussed return precautions, follow-up instructions, and discharged instructions with patient and/or family.   ___________________________________________   FINAL CLINICAL IMPRESSION(S) / ED DIAGNOSES   Final diagnoses:  Hand  contusion, right, initial encounter  Forearm laceration, right, initial encounter    Patient was discharged home with diagnosis and instructions appropriate for her however printed on another patient's name.  Nurse, Shirlee Limerick, to call patient to let them know.          Note: This dictation was prepared with Dragon dictation. Any transcriptional errors that result from this process are unintentional   Lisa Roca, MD 01/20/16 1356

## 2016-01-20 NOTE — ED Notes (Signed)
Per Dr. Reita Cliche, patient does not need labs. Called lab and spoke with Caryl Pina, informed her to cancel lab orders.

## 2016-01-20 NOTE — ED Notes (Signed)
Report given to Grace,RN.

## 2016-01-20 NOTE — ED Notes (Signed)
Patient brought in by [facility] from [facility] place for a fall. Patient was there visiting her husband and lost her footing cause her to fall. Patient hit her head on the foot of the bed. Patient has a hematoma to the right hand, laceration to the right arm and a small abrasion on her chin on the left side.

## 2016-01-25 ENCOUNTER — Ambulatory Visit (INDEPENDENT_AMBULATORY_CARE_PROVIDER_SITE_OTHER): Payer: Medicare Other | Admitting: Family Medicine

## 2016-01-25 ENCOUNTER — Encounter: Payer: Self-pay | Admitting: Family Medicine

## 2016-01-25 VITALS — BP 108/78 | HR 72 | Temp 98.1°F | Resp 16 | Wt 128.0 lb

## 2016-01-25 DIAGNOSIS — T148 Other injury of unspecified body region: Secondary | ICD-10-CM

## 2016-01-25 DIAGNOSIS — M109 Gout, unspecified: Secondary | ICD-10-CM | POA: Diagnosis not present

## 2016-01-25 DIAGNOSIS — IMO0002 Reserved for concepts with insufficient information to code with codable children: Secondary | ICD-10-CM

## 2016-01-25 DIAGNOSIS — N183 Chronic kidney disease, stage 3 unspecified: Secondary | ICD-10-CM

## 2016-01-25 DIAGNOSIS — J439 Emphysema, unspecified: Secondary | ICD-10-CM | POA: Diagnosis not present

## 2016-01-25 DIAGNOSIS — J8410 Pulmonary fibrosis, unspecified: Secondary | ICD-10-CM | POA: Diagnosis not present

## 2016-01-25 NOTE — Progress Notes (Signed)
Subjective:    Patient ID: Marilyn Oconnell, female    DOB: November 10, 1923, 82 y.o.   MRN: [id number]  HPI   Follow up ER visit  Patient was seen in ER for fall on 01/20/2016. She was treated for right hand contusion and right forearm laceration. Treatment for this included checking films and giving Tdap booster, as well as applying steri-strips to laceration. She reports good compliance with treatment. She reports this condition is Improved.  Also, gout has improved.    ------------------------------------------------------------------------------------    Review of Systems  Constitutional: Positive for fatigue. Negative for fever, chills, diaphoresis, activity change, appetite change and unexpected weight change.  Skin: Positive for wound.   BP 108/78 mmHg  Pulse 72  Temp(Src) 98.1 F (36.7 C) (Oral)  Resp 16  Wt 128 lb (58.06 kg)   Patient Active Problem List   Diagnosis Date Noted  . Chest pain 11/27/2015  . Paroxysmal atrial fibrillation with RVR ([place]) 11/27/2015  . Dyspnea 11/27/2015  . Elevated troponin 11/27/2015  . Depression 11/27/2015  . Constipation 11/27/2015  . Fracture of left humerus 11/27/2015  . Humerus fracture 11/21/2015  . Adaptation reaction 09/28/2015  . Aortic heart valve narrowing 09/28/2015  . Benign hypertension 09/28/2015  . Bilateral cataracts 09/28/2015  . Cardiac murmur 09/28/2015  . Chronic kidney disease (CKD), stage III (moderate) 09/28/2015  . Clinical depression 09/28/2015  . Gout 09/28/2015  . 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/2015  . OP (osteoporosis) 09/28/2015  . Subclinical hypothyroidism 09/28/2015  . Atherosclerosis of coronary artery 09/08/2015  . Avitaminosis D 07/11/2015  . LBP (low back pain) 07/11/2015  . BP (high blood pressure) 07/11/2015  . Hyperlipemia 04/12/2015  . Billowing mitral valve 08/03/2014  . Arteriosclerosis of coronary artery 08/03/2014  . Fibrosis of lung ([place]) 05/25/2014  . Asthma-chronic obstructive  pulmonary disease overlap syndrome ([place]) 05/25/2014  . H/O aortic valve replacement 11/09/2004   Past Medical History  Diagnosis Date  . Hypertension   . Hypercholesteremia   . Coronary artery disease   . Arthritis   . COPD (chronic obstructive pulmonary disease) ([place])   . CHF (congestive heart failure) ([place])    Current Outpatient Prescriptions on File Prior to Visit  Medication Sig  . acetaminophen (TYLENOL) 500 MG tablet Take 500-1,000 mg by mouth 2 (two) times daily as needed for mild pain, moderate pain, fever or headache.   . albuterol (PROVENTIL) (2.5 MG/3ML) 0.083% nebulizer solution Take 2.5 mg by nebulization every 6 (six) hours as needed for wheezing or shortness of breath.  Marland Kitchen aspirin EC 81 MG tablet Take 81 mg by mouth daily.  Marland Kitchen docusate sodium (COLACE) 100 MG capsule Take 1 capsule (100 mg total) by mouth daily as needed.  Marland Kitchen escitalopram (LEXAPRO) 10 MG tablet Take 1 tablet (10 mg total) by mouth daily. 1/2 a day for 7 days and then one a day.  . Fluticasone-Salmeterol (ADVAIR) 250-50 MCG/DOSE AEPB Inhale 1 puff into the lungs 2 (two) times daily.  . furosemide (LASIX) 20 MG tablet TAKE 1 TABLET BY MOUTH ONCE DAILY.  . metoprolol (LOPRESSOR) 100 MG tablet Take 100 mg by mouth 2 (two) times daily.  . ondansetron (ZOFRAN ODT) 4 MG disintegrating tablet Take 1 tablet (4 mg total) by mouth every 8 (eight) hours as needed for nausea or vomiting.  . simvastatin (ZOCOR) 20 MG tablet Take 20 mg by mouth at bedtime.   No current facility-administered medications on file prior to visit.   Allergies  Allergen Reactions  .  Iodine Anaphylaxis  . Shellfish Allergy Anaphylaxis  . Azithromycin Hives  . Sulfa Antibiotics Hives   Past Surgical History  Procedure Laterality Date  . Shoulder surgery  09/1999  . Sigmoidoscopy  1992  . Dilation and curettage of uterus  1988  . Aortic valve replacement  2006   Social History   Social History  . Marital Status: Married    Spouse  Name: N/A  . Number of Children: N/A  . Years of Education: grade 8   Occupational History  . Not on file.   Social History Main Topics  . Smoking status: Never Smoker   . Smokeless tobacco: Never Used  . Alcohol Use: No  . Drug Use: No  . Sexual Activity: Not on file   Other Topics Concern  . Not on file   Social History Narrative   Family History  Problem Relation Age of Onset  . Aneurysm Father   . Lung cancer Brother   . Prostate cancer Brother   . Heart failure Brother        Objective:   Physical Exam  Constitutional: She is oriented to person, place, and time. She appears well-developed and well-nourished.  Neurological: She is alert and oriented to person, place, and time.  Skin:  Well healing skin laceration on right forearm.  Steri-strips in place. No erythema.    Psychiatric: She has a normal mood and affect.  BP 108/78 mmHg  Pulse 72  Temp(Src) 98.1 F (36.7 C) (Oral)  Resp 16  Wt 128 lb (58.06 kg)     Assessment & Plan:  1. Chronic kidney disease (CKD), stage III (moderate) Will check labs.   - Comprehensive Metabolic Panel (CMET) - CBC  2. Gout, unspecified cause, unspecified chronicity, unspecified site Stable.  Improved. Will check uric acid. May need to consider allopurinol.   - Uric acid  3. Skin laceration Improved.  Continue to monitor.     Patient seen and examined by Jerrell Belfast, MD, and note scribed by Renaldo Fiddler, CMA.   I have reviewed the document for accuracy and completeness and I agree with above. Jerrell Belfast, MD   Margarita Rana, MD

## 2016-01-26 ENCOUNTER — Other Ambulatory Visit: Payer: Self-pay | Admitting: Family Medicine

## 2016-01-26 DIAGNOSIS — IMO0002 Reserved for concepts with insufficient information to code with codable children: Secondary | ICD-10-CM | POA: Insufficient documentation

## 2016-01-26 LAB — COMPREHENSIVE METABOLIC PANEL
ALT: 9 IU/L (ref 0–32)
AST: 17 IU/L (ref 0–40)
Albumin/Globulin Ratio: 1.2 (ref 1.2–2.2)
Albumin: 3.8 g/dL (ref 3.5–4.7)
Alkaline Phosphatase: 79 IU/L (ref 39–117)
BUN/Creatinine Ratio: 23 (ref 12–28)
BUN: 23 mg/dL (ref 8–27)
Bilirubin Total: 0.3 mg/dL (ref 0.0–1.2)
CO2: 27 mmol/L (ref 18–29)
Calcium: 8.6 mg/dL — ABNORMAL LOW (ref 8.7–10.3)
Chloride: 97 mmol/L (ref 96–106)
Creatinine, Ser: 1.02 mg/dL — ABNORMAL HIGH (ref 0.57–1.00)
GFR calc Af Amer: 56 mL/min/{1.73_m2} — ABNORMAL LOW (ref >59)
GFR calc non Af Amer: 49 mL/min/{1.73_m2} — ABNORMAL LOW (ref >59)
Globulin, Total: 3.3 g/dL (ref 1.5–4.5)
Glucose: 102 mg/dL — ABNORMAL HIGH (ref 65–99)
Potassium: 4.1 mmol/L (ref 3.5–5.2)
Sodium: 144 mmol/L (ref 134–144)
Total Protein: 7.1 g/dL (ref 6.0–8.5)

## 2016-01-26 LAB — CBC
Hematocrit: 34 % (ref 34.0–46.6)
Hemoglobin: 10.9 g/dL — ABNORMAL LOW (ref 11.1–15.9)
MCH: 29.2 pg (ref 26.6–33.0)
MCHC: 32.1 g/dL (ref 31.5–35.7)
MCV: 91 fL (ref 79–97)
Platelets: 316 10*3/uL (ref 150–379)
RBC: 3.73 x10E6/uL — ABNORMAL LOW (ref 3.77–5.28)
RDW: 13.6 % (ref 12.3–15.4)
WBC: 8.7 10*3/uL (ref 3.4–10.8)

## 2016-01-26 LAB — URIC ACID: Uric Acid: 9.1 mg/dL — ABNORMAL HIGH (ref 2.5–7.1)

## 2016-01-26 MED ORDER — ALLOPURINOL 100 MG PO TABS: 100.0000 mg | ORAL_TABLET | Freq: Every day | ORAL | Status: DC

## 2016-01-31 ENCOUNTER — Other Ambulatory Visit: Payer: Self-pay | Admitting: Family Medicine

## 2016-01-31 DIAGNOSIS — F32A Depression, unspecified: Secondary | ICD-10-CM

## 2016-01-31 DIAGNOSIS — F329 Major depressive disorder, single episode, unspecified: Secondary | ICD-10-CM

## 2016-02-14 DIAGNOSIS — J8410 Pulmonary fibrosis, unspecified: Secondary | ICD-10-CM | POA: Diagnosis not present

## 2016-02-14 DIAGNOSIS — H540 Blindness, both eyes: Secondary | ICD-10-CM | POA: Diagnosis not present

## 2016-02-14 DIAGNOSIS — N183 Chronic kidney disease, stage 3 (moderate): Secondary | ICD-10-CM | POA: Diagnosis not present

## 2016-02-14 DIAGNOSIS — I480 Paroxysmal atrial fibrillation: Secondary | ICD-10-CM | POA: Diagnosis not present

## 2016-02-14 DIAGNOSIS — M810 Age-related osteoporosis without current pathological fracture: Secondary | ICD-10-CM | POA: Diagnosis not present

## 2016-02-14 DIAGNOSIS — F329 Major depressive disorder, single episode, unspecified: Secondary | ICD-10-CM | POA: Diagnosis not present

## 2016-02-14 DIAGNOSIS — S42291D Other displaced fracture of upper end of right humerus, subsequent encounter for fracture with routine healing: Secondary | ICD-10-CM | POA: Diagnosis not present

## 2016-02-14 DIAGNOSIS — W19XXXD Unspecified fall, subsequent encounter: Secondary | ICD-10-CM | POA: Diagnosis not present

## 2016-02-14 DIAGNOSIS — J849 Interstitial pulmonary disease, unspecified: Secondary | ICD-10-CM | POA: Diagnosis not present

## 2016-02-14 DIAGNOSIS — I129 Hypertensive chronic kidney disease with stage 1 through stage 4 chronic kidney disease, or unspecified chronic kidney disease: Secondary | ICD-10-CM | POA: Diagnosis not present

## 2016-02-14 DIAGNOSIS — H3530 Unspecified macular degeneration: Secondary | ICD-10-CM | POA: Diagnosis not present

## 2016-02-22 DIAGNOSIS — S42291D Other displaced fracture of upper end of right humerus, subsequent encounter for fracture with routine healing: Secondary | ICD-10-CM | POA: Diagnosis not present

## 2016-02-22 DIAGNOSIS — I129 Hypertensive chronic kidney disease with stage 1 through stage 4 chronic kidney disease, or unspecified chronic kidney disease: Secondary | ICD-10-CM | POA: Diagnosis not present

## 2016-02-22 DIAGNOSIS — J849 Interstitial pulmonary disease, unspecified: Secondary | ICD-10-CM | POA: Diagnosis not present

## 2016-02-22 DIAGNOSIS — I480 Paroxysmal atrial fibrillation: Secondary | ICD-10-CM | POA: Diagnosis not present

## 2016-02-22 DIAGNOSIS — J8410 Pulmonary fibrosis, unspecified: Secondary | ICD-10-CM | POA: Diagnosis not present

## 2016-02-22 DIAGNOSIS — N183 Chronic kidney disease, stage 3 (moderate): Secondary | ICD-10-CM | POA: Diagnosis not present

## 2016-02-23 DIAGNOSIS — S42291D Other displaced fracture of upper end of right humerus, subsequent encounter for fracture with routine healing: Secondary | ICD-10-CM | POA: Diagnosis not present

## 2016-02-23 DIAGNOSIS — J849 Interstitial pulmonary disease, unspecified: Secondary | ICD-10-CM | POA: Diagnosis not present

## 2016-02-23 DIAGNOSIS — I129 Hypertensive chronic kidney disease with stage 1 through stage 4 chronic kidney disease, or unspecified chronic kidney disease: Secondary | ICD-10-CM | POA: Diagnosis not present

## 2016-02-23 DIAGNOSIS — I480 Paroxysmal atrial fibrillation: Secondary | ICD-10-CM | POA: Diagnosis not present

## 2016-02-23 DIAGNOSIS — J8410 Pulmonary fibrosis, unspecified: Secondary | ICD-10-CM | POA: Diagnosis not present

## 2016-02-23 DIAGNOSIS — N183 Chronic kidney disease, stage 3 (moderate): Secondary | ICD-10-CM | POA: Diagnosis not present

## 2016-02-26 ENCOUNTER — Other Ambulatory Visit: Payer: Self-pay | Admitting: Family Medicine

## 2016-02-26 DIAGNOSIS — I129 Hypertensive chronic kidney disease with stage 1 through stage 4 chronic kidney disease, or unspecified chronic kidney disease: Secondary | ICD-10-CM | POA: Diagnosis not present

## 2016-02-26 DIAGNOSIS — J8410 Pulmonary fibrosis, unspecified: Secondary | ICD-10-CM | POA: Diagnosis not present

## 2016-02-26 DIAGNOSIS — S42291D Other displaced fracture of upper end of right humerus, subsequent encounter for fracture with routine healing: Secondary | ICD-10-CM | POA: Diagnosis not present

## 2016-02-26 DIAGNOSIS — I480 Paroxysmal atrial fibrillation: Secondary | ICD-10-CM | POA: Diagnosis not present

## 2016-02-26 DIAGNOSIS — E785 Hyperlipidemia, unspecified: Secondary | ICD-10-CM

## 2016-02-26 DIAGNOSIS — N183 Chronic kidney disease, stage 3 (moderate): Secondary | ICD-10-CM | POA: Diagnosis not present

## 2016-02-26 DIAGNOSIS — J849 Interstitial pulmonary disease, unspecified: Secondary | ICD-10-CM | POA: Diagnosis not present

## 2016-02-27 DIAGNOSIS — S42291D Other displaced fracture of upper end of right humerus, subsequent encounter for fracture with routine healing: Secondary | ICD-10-CM | POA: Diagnosis not present

## 2016-02-28 DIAGNOSIS — S42291D Other displaced fracture of upper end of right humerus, subsequent encounter for fracture with routine healing: Secondary | ICD-10-CM | POA: Diagnosis not present

## 2016-02-28 DIAGNOSIS — J849 Interstitial pulmonary disease, unspecified: Secondary | ICD-10-CM | POA: Diagnosis not present

## 2016-02-28 DIAGNOSIS — J8410 Pulmonary fibrosis, unspecified: Secondary | ICD-10-CM | POA: Diagnosis not present

## 2016-02-28 DIAGNOSIS — N183 Chronic kidney disease, stage 3 (moderate): Secondary | ICD-10-CM | POA: Diagnosis not present

## 2016-02-28 DIAGNOSIS — I480 Paroxysmal atrial fibrillation: Secondary | ICD-10-CM | POA: Diagnosis not present

## 2016-02-28 DIAGNOSIS — I129 Hypertensive chronic kidney disease with stage 1 through stage 4 chronic kidney disease, or unspecified chronic kidney disease: Secondary | ICD-10-CM | POA: Diagnosis not present

## 2016-03-05 DIAGNOSIS — J849 Interstitial pulmonary disease, unspecified: Secondary | ICD-10-CM | POA: Diagnosis not present

## 2016-03-05 DIAGNOSIS — I480 Paroxysmal atrial fibrillation: Secondary | ICD-10-CM | POA: Diagnosis not present

## 2016-03-05 DIAGNOSIS — S42291D Other displaced fracture of upper end of right humerus, subsequent encounter for fracture with routine healing: Secondary | ICD-10-CM | POA: Diagnosis not present

## 2016-03-05 DIAGNOSIS — I129 Hypertensive chronic kidney disease with stage 1 through stage 4 chronic kidney disease, or unspecified chronic kidney disease: Secondary | ICD-10-CM | POA: Diagnosis not present

## 2016-03-05 DIAGNOSIS — N183 Chronic kidney disease, stage 3 (moderate): Secondary | ICD-10-CM | POA: Diagnosis not present

## 2016-03-05 DIAGNOSIS — J8410 Pulmonary fibrosis, unspecified: Secondary | ICD-10-CM | POA: Diagnosis not present

## 2016-03-07 DIAGNOSIS — S42291D Other displaced fracture of upper end of right humerus, subsequent encounter for fracture with routine healing: Secondary | ICD-10-CM | POA: Diagnosis not present

## 2016-03-07 DIAGNOSIS — I480 Paroxysmal atrial fibrillation: Secondary | ICD-10-CM | POA: Diagnosis not present

## 2016-03-07 DIAGNOSIS — J8410 Pulmonary fibrosis, unspecified: Secondary | ICD-10-CM | POA: Diagnosis not present

## 2016-03-07 DIAGNOSIS — I129 Hypertensive chronic kidney disease with stage 1 through stage 4 chronic kidney disease, or unspecified chronic kidney disease: Secondary | ICD-10-CM | POA: Diagnosis not present

## 2016-03-07 DIAGNOSIS — N183 Chronic kidney disease, stage 3 (moderate): Secondary | ICD-10-CM | POA: Diagnosis not present

## 2016-03-07 DIAGNOSIS — J849 Interstitial pulmonary disease, unspecified: Secondary | ICD-10-CM | POA: Diagnosis not present

## 2016-03-11 ENCOUNTER — Ambulatory Visit (INDEPENDENT_AMBULATORY_CARE_PROVIDER_SITE_OTHER): Payer: Medicare Other | Admitting: Family Medicine

## 2016-03-11 DIAGNOSIS — G4700 Insomnia, unspecified: Secondary | ICD-10-CM

## 2016-03-11 DIAGNOSIS — R441 Visual hallucinations: Secondary | ICD-10-CM

## 2016-03-11 MED ORDER — TRAZODONE HCL 50 MG PO TABS: ORAL_TABLET | ORAL | Status: DC

## 2016-03-11 NOTE — Progress Notes (Signed)
Subjective:     Patient ID: Marilyn Oconnell, female   DOB: 01-26-1924, 82 y.o.   MRN: [id number]  HPI  Chief Complaint  Patient presents with  . Fatigue    Patient reports that she has not felt well all day. She reports that she is very jittery and has an unstable gait. Patient reports that it is difficult for her to get a good nights rest since her husband died.   Accompanied by her niece, states that she has nearly lost her sight. Subsequently she will have visual hallucinations (seeing her late husband, farm animals) at night which effects her ability to rest well. She is aware these are not real the following morning. States she worries about finances and is reluctant to go to a more structured setting. Niece wishes her to live near her (5 minutes away) in her later father's home. Currently on Lexapro.   Review of Systems     Objective:   Physical Exam  Constitutional: She appears well-developed and well-nourished. No distress (get up on exam table with min assist of two due to poor eyesight. ).  Cardiovascular: Normal rate and regular rhythm.   Murmur (blowing systolic) heard. Pulmonary/Chest:  Lungs clear  Musculoskeletal: She exhibits no edema (of lower extremities).       Assessment:    1. Insomnia - traZODone (DESYREL) 50 MG tablet; Take 1/2 pill at bedtime  Dispense: 30 tablet; Refill: 0  2. Visual hallucinations: discussed with Dr. Venia Minks - traZODone (DESYREL) 50 MG tablet; Take 1/2 pill at bedtime  Dispense: 30 tablet; Refill: 0    Plan:    f/u in one week with Dr. Venia Minks. Consider assisted living.

## 2016-03-11 NOTE — Patient Instructions (Signed)
We will see you in a week.

## 2016-03-12 DIAGNOSIS — S42291D Other displaced fracture of upper end of right humerus, subsequent encounter for fracture with routine healing: Secondary | ICD-10-CM | POA: Diagnosis not present

## 2016-03-12 DIAGNOSIS — N183 Chronic kidney disease, stage 3 (moderate): Secondary | ICD-10-CM | POA: Diagnosis not present

## 2016-03-12 DIAGNOSIS — I480 Paroxysmal atrial fibrillation: Secondary | ICD-10-CM | POA: Diagnosis not present

## 2016-03-12 DIAGNOSIS — J8410 Pulmonary fibrosis, unspecified: Secondary | ICD-10-CM | POA: Diagnosis not present

## 2016-03-12 DIAGNOSIS — I129 Hypertensive chronic kidney disease with stage 1 through stage 4 chronic kidney disease, or unspecified chronic kidney disease: Secondary | ICD-10-CM | POA: Diagnosis not present

## 2016-03-12 DIAGNOSIS — J849 Interstitial pulmonary disease, unspecified: Secondary | ICD-10-CM | POA: Diagnosis not present

## 2016-03-14 DIAGNOSIS — N183 Chronic kidney disease, stage 3 (moderate): Secondary | ICD-10-CM | POA: Diagnosis not present

## 2016-03-14 DIAGNOSIS — I129 Hypertensive chronic kidney disease with stage 1 through stage 4 chronic kidney disease, or unspecified chronic kidney disease: Secondary | ICD-10-CM | POA: Diagnosis not present

## 2016-03-14 DIAGNOSIS — S42291D Other displaced fracture of upper end of right humerus, subsequent encounter for fracture with routine healing: Secondary | ICD-10-CM | POA: Diagnosis not present

## 2016-03-14 DIAGNOSIS — I480 Paroxysmal atrial fibrillation: Secondary | ICD-10-CM | POA: Diagnosis not present

## 2016-03-14 DIAGNOSIS — J849 Interstitial pulmonary disease, unspecified: Secondary | ICD-10-CM | POA: Diagnosis not present

## 2016-03-14 DIAGNOSIS — J8410 Pulmonary fibrosis, unspecified: Secondary | ICD-10-CM | POA: Diagnosis not present

## 2016-03-15 ENCOUNTER — Telehealth: Payer: Self-pay | Admitting: Family Medicine

## 2016-03-15 DIAGNOSIS — R42 Dizziness and giddiness: Secondary | ICD-10-CM | POA: Insufficient documentation

## 2016-03-15 NOTE — Telephone Encounter (Signed)
Niece called to report worsening vertigo. Will refer to ENT.

## 2016-03-18 ENCOUNTER — Ambulatory Visit (INDEPENDENT_AMBULATORY_CARE_PROVIDER_SITE_OTHER): Payer: Medicare Other | Admitting: Family Medicine

## 2016-03-18 ENCOUNTER — Encounter: Payer: Self-pay | Admitting: Family Medicine

## 2016-03-18 VITALS — BP 124/68 | HR 64 | Temp 98.2°F | Resp 16 | Wt 124.0 lb

## 2016-03-18 DIAGNOSIS — G4700 Insomnia, unspecified: Secondary | ICD-10-CM

## 2016-03-18 DIAGNOSIS — R441 Visual hallucinations: Secondary | ICD-10-CM

## 2016-03-18 DIAGNOSIS — R42 Dizziness and giddiness: Secondary | ICD-10-CM

## 2016-03-18 NOTE — Progress Notes (Signed)
Patient: Marilyn Oconnell Female    DOB: Sep 10, 1924   82 y.o.   MRN: [id number] Visit Date: 03/18/2016  Today's Provider: Margarita Rana, MD   Chief Complaint  Patient presents with  . Insomnia  . Hallucinations   Subjective:    Insomnia Primary symptoms: sleep disturbance.  The current episode started more than one year. The problem has been gradually improving since onset. Past treatments include meditation. The treatment provided moderate relief. Typical bedtime:  8-10 P.M..  How long after going to bed to you fall asleep: other (Pt says it doesn't take long for her to fall asleep.).   Sleep duration: Pt does take naps during the day; when she is sitting in her chair.  She is not sure how long she sleeps for.   PMH includes: no apnea.       Allergies  Allergen Reactions  . Iodine Anaphylaxis  . Shellfish Allergy Anaphylaxis  . Azithromycin Hives  . Sulfa Antibiotics Hives   Current Meds  Medication Sig  . acetaminophen (TYLENOL) 500 MG tablet Take 500-1,000 mg by mouth 2 (two) times daily as needed for mild pain, moderate pain, fever or headache.   . albuterol (PROVENTIL) (2.5 MG/3ML) 0.083% nebulizer solution Take 2.5 mg by nebulization every 6 (six) hours as needed for wheezing or shortness of breath.  . allopurinol (ZYLOPRIM) 100 MG tablet Take 1 tablet (100 mg total) by mouth daily.  Marland Kitchen aspirin EC 81 MG tablet Take 81 mg by mouth daily.  Marland Kitchen docusate sodium (COLACE) 100 MG capsule Take 1 capsule (100 mg total) by mouth daily as needed.  Marland Kitchen escitalopram (LEXAPRO) 10 MG tablet TAKE 1 TABLET BY MOUTH ONCE DAILY.  Marland Kitchen Fluticasone-Salmeterol (ADVAIR) 250-50 MCG/DOSE AEPB Inhale 1 puff into the lungs 2 (two) times daily.  . furosemide (LASIX) 20 MG tablet TAKE 1 TABLET BY MOUTH ONCE DAILY.  . metoprolol (LOPRESSOR) 100 MG tablet Take 100 mg by mouth 2 (two) times daily.  . ondansetron (ZOFRAN ODT) 4 MG disintegrating tablet Take 1 tablet (4 mg total) by mouth every 8 (eight)  hours as needed for nausea or vomiting.  . simvastatin (ZOCOR) 20 MG tablet TAKE ONE TABLET BY MOUTH AT BEDTIME.  . traZODone (DESYREL) 50 MG tablet Take 1/2 pill at bedtime    Review of Systems  Constitutional: Negative for fever, chills, diaphoresis, activity change, appetite change, fatigue and unexpected weight change.  Respiratory: Positive for cough, shortness of breath and wheezing. Negative for apnea, choking, chest tightness and stridor.        Chronic issue   Cardiovascular: Negative for chest pain, palpitations and leg swelling.  Gastrointestinal: Negative.   Musculoskeletal: Positive for gait problem (Pt has fallen several times secondary to her dizziness. ).  Neurological: Positive for dizziness, weakness, light-headedness and headaches (Occasionally). Negative for tremors, seizures, syncope and facial asymmetry.       Has a referral to see ENT next week to evaluate pt's dizziness.    Psychiatric/Behavioral: Positive for hallucinations, behavioral problems, confusion (Pt's caregiver says this does not happen very often. ), sleep disturbance, decreased concentration and agitation. Negative for suicidal ideas, self-injury and dysphoric mood. The patient has insomnia. The patient is not nervous/anxious.     Social History  Substance Use Topics  . Smoking status: Never Smoker   . Smokeless tobacco: Never Used  . Alcohol Use: No   Objective:   BP 124/68 mmHg  Pulse 64  Temp(Src) 98.2 F (  36.8 C) (Oral)  Resp 16  Wt 124 lb (56.246 kg)  Physical Exam  Constitutional: She is oriented to person, place, and time. She appears well-developed and well-nourished.  Neurological: She is alert and oriented to person, place, and time.  Skin: Skin is warm and dry.  Psychiatric: She has a normal mood and affect. Her behavior is normal. Judgment and thought content normal.      Assessment & Plan:     1. Insomnia Improved; will continue current medications.  Follow up with Mikki Santee in  four weeks.   2. Visual hallucinations As above. Will consider antipsychotic if symptoms worsen or do not continue to improve.    3. Vertigo Will follow up with ENT next week. Will not change medication for hallucinations until vertigo is improved. Again encouraged patient to move to assisted living.     Patient was seen and examined by Jerrell Belfast, MD, and note scribed by Ashley Royalty, CMA.   I have reviewed the document for accuracy and completeness and I agree with above. - Jerrell Belfast, MD         Margarita Rana, MD  [facility]

## 2016-03-27 DIAGNOSIS — I2510 Atherosclerotic heart disease of native coronary artery without angina pectoris: Secondary | ICD-10-CM | POA: Diagnosis not present

## 2016-03-27 DIAGNOSIS — I480 Paroxysmal atrial fibrillation: Secondary | ICD-10-CM | POA: Diagnosis not present

## 2016-03-27 DIAGNOSIS — I10 Essential (primary) hypertension: Secondary | ICD-10-CM | POA: Diagnosis not present

## 2016-03-27 DIAGNOSIS — Z952 Presence of prosthetic heart valve: Secondary | ICD-10-CM | POA: Diagnosis not present

## 2016-03-27 DIAGNOSIS — I4891 Unspecified atrial fibrillation: Secondary | ICD-10-CM | POA: Diagnosis not present

## 2016-03-27 DIAGNOSIS — I350 Nonrheumatic aortic (valve) stenosis: Secondary | ICD-10-CM | POA: Diagnosis not present

## 2016-03-27 DIAGNOSIS — I341 Nonrheumatic mitral (valve) prolapse: Secondary | ICD-10-CM | POA: Diagnosis not present

## 2016-03-29 DIAGNOSIS — H8111 Benign paroxysmal vertigo, right ear: Secondary | ICD-10-CM | POA: Diagnosis not present

## 2016-04-04 DIAGNOSIS — H8111 Benign paroxysmal vertigo, right ear: Secondary | ICD-10-CM | POA: Diagnosis not present

## 2016-04-16 ENCOUNTER — Encounter: Payer: Self-pay | Admitting: Family Medicine

## 2016-04-16 ENCOUNTER — Ambulatory Visit (INDEPENDENT_AMBULATORY_CARE_PROVIDER_SITE_OTHER): Payer: Medicare Other | Admitting: Family Medicine

## 2016-04-16 VITALS — BP 144/82 | HR 60 | Temp 97.9°F | Resp 16 | Wt 121.0 lb

## 2016-04-16 DIAGNOSIS — G4700 Insomnia, unspecified: Secondary | ICD-10-CM | POA: Diagnosis not present

## 2016-04-16 DIAGNOSIS — R441 Visual hallucinations: Secondary | ICD-10-CM | POA: Diagnosis not present

## 2016-04-16 MED ORDER — TRAZODONE HCL 50 MG PO TABS: ORAL_TABLET | ORAL | 5 refills | Status: DC

## 2016-04-16 NOTE — Progress Notes (Signed)
Subjective:     Patient ID: IZONA STUDE, female   DOB: November 04, 1923, 82 y.o.   MRN: [id number]  HPI Chief Complaint  Patient presents with  . Insomnia    Four week follow-up;  Pt reports improvement.   States trazodone helps her to sleep at night. Continues to have visual hallucinations-her late husband and farm animals-but these are not fear inducing. Reports dizziness improved after ENT treatment for BPV but feels it may be returning. She is accompanied by her caregiver, Olegario Shearer.  Review of Systems     Objective:   Physical Exam  Constitutional: She appears well-developed and well-nourished. No distress.  Psychiatric: She has a normal mood and affect. Her behavior is normal.       Assessment:    1. Insomnia - traZODone (DESYREL) 50 MG tablet; Take 1/2 pill at bedtime  Dispense: 30 tablet; Refill: 5  2. Visual hallucinations: If they become alarming may refer for possible placement on medication.     Plan:    Return to ENT if dizzy becomes persistent. Follow up blood pressure in 6 months.

## 2016-04-16 NOTE — Patient Instructions (Signed)
Try t remember to take you sleeping medication. If your dizziness returns to follow up with the ENT doctor.

## 2016-04-19 ENCOUNTER — Other Ambulatory Visit: Payer: Self-pay | Admitting: Family Medicine

## 2016-04-19 DIAGNOSIS — F32A Depression, unspecified: Secondary | ICD-10-CM

## 2016-04-19 DIAGNOSIS — I10 Essential (primary) hypertension: Secondary | ICD-10-CM

## 2016-04-19 DIAGNOSIS — F329 Major depressive disorder, single episode, unspecified: Secondary | ICD-10-CM

## 2016-04-19 NOTE — Telephone Encounter (Signed)
Based on my review she should have plenty for furosemide and escitalopram left. Please check with pharmacy. I would refill the metoprolol as updated

## 2016-04-19 NOTE — Telephone Encounter (Signed)
Spoke with pharmacist on phone who states that these prescriptions were just filled and have a refill, she stated that if we received medication refill request it was in error. KW

## 2016-05-17 ENCOUNTER — Ambulatory Visit (INDEPENDENT_AMBULATORY_CARE_PROVIDER_SITE_OTHER): Payer: Medicare Other | Admitting: Family Medicine

## 2016-05-17 ENCOUNTER — Encounter: Payer: Self-pay | Admitting: Family Medicine

## 2016-05-17 VITALS — BP 120/66 | HR 73 | Temp 98.1°F | Resp 14 | Wt 122.6 lb

## 2016-05-17 DIAGNOSIS — L659 Nonscarring hair loss, unspecified: Secondary | ICD-10-CM

## 2016-05-17 DIAGNOSIS — R42 Dizziness and giddiness: Secondary | ICD-10-CM | POA: Diagnosis not present

## 2016-05-17 NOTE — Progress Notes (Signed)
Subjective:     Patient ID: Marilyn Oconnell, female   DOB: 27-Oct-1923, 82 y.o.   MRN: [id number]  HPI  Chief Complaint  Patient presents with  . Dizziness    Patient comes into office today with her friend who reports that patient has been complaining of dizziness and weakness intermittent for the past month. Friend states that paitent has had 3 episodes that she calls "blacking out". Patient states that she had dizziness upon standing and has fell towards wall when walking.   Marland Kitchen Alopecia    Patient would like to address hair loss over the past year  Denies palpitations at onset of episodes of weakness. States she "blacks out" for a second or two but has caught herself and has not had a fall. Accompanied by a friend today. Has seen ENT for vertigo with improvement and cardiology (Dr. Ubaldo Glassing) in [place] to be stable. She is accompanied by a friend today.   Review of Systems  HENT:       Reports fleeting left sided facial pain which just last for a second then abates  Genitourinary:       Urinates frequently during the day c/w diuretic use       Objective:   Physical Exam  Constitutional: She appears well-developed and well-nourished. No distress (ambulating with a s.p. cane).  Cardiovascular:  2/6 systolic murmur with click  Pulmonary/Chest: Breath sounds normal.  Musculoskeletal: She exhibits no edema (of lower extremties).  Skin:  No patchy hair loss noted.       Assessment:    1. Dizziness - Comprehensive metabolic panel - CBC with Differential/Platelet  2. Hair loss - T4, free - TSH    Plan:    Further f/u pending lab work.

## 2016-05-17 NOTE — Patient Instructions (Signed)
We will call you with the lab results. 

## 2016-05-18 LAB — COMPREHENSIVE METABOLIC PANEL
ALT: 7 IU/L (ref 0–32)
AST: 13 IU/L (ref 0–40)
Albumin/Globulin Ratio: 1.3 (ref 1.2–2.2)
Albumin: 4 g/dL (ref 3.2–4.6)
Alkaline Phosphatase: 75 IU/L (ref 39–117)
BUN/Creatinine Ratio: 22 (ref 12–28)
BUN: 29 mg/dL (ref 10–36)
Bilirubin Total: <0.2 mg/dL (ref 0.0–1.2)
CO2: 28 mmol/L (ref 18–29)
Calcium: 9.3 mg/dL (ref 8.7–10.3)
Chloride: 100 mmol/L (ref 96–106)
Creatinine, Ser: 1.32 mg/dL — ABNORMAL HIGH (ref 0.57–1.00)
GFR calc Af Amer: 41 mL/min/{1.73_m2} — ABNORMAL LOW (ref >59)
GFR calc non Af Amer: 36 mL/min/{1.73_m2} — ABNORMAL LOW (ref >59)
Globulin, Total: 3 g/dL (ref 1.5–4.5)
Glucose: 96 mg/dL (ref 65–99)
Potassium: 4.6 mmol/L (ref 3.5–5.2)
Sodium: 144 mmol/L (ref 134–144)
Total Protein: 7 g/dL (ref 6.0–8.5)

## 2016-05-18 LAB — CBC WITH DIFFERENTIAL/PLATELET
Basophils Absolute: 0.1 10*3/uL (ref 0.0–0.2)
Basos: 1 %
EOS (ABSOLUTE): 0.4 10*3/uL (ref 0.0–0.4)
Eos: 5 %
Hematocrit: 35.5 % (ref 34.0–46.6)
Hemoglobin: 11.8 g/dL (ref 11.1–15.9)
Immature Grans (Abs): 0 10*3/uL (ref 0.0–0.1)
Immature Granulocytes: 0 %
Lymphocytes Absolute: 1.7 10*3/uL (ref 0.7–3.1)
Lymphs: 26 %
MCH: 30 pg (ref 26.6–33.0)
MCHC: 33.2 g/dL (ref 31.5–35.7)
MCV: 90 fL (ref 79–97)
Monocytes Absolute: 0.9 10*3/uL (ref 0.1–0.9)
Monocytes: 13 %
Neutrophils Absolute: 3.6 10*3/uL (ref 1.4–7.0)
Neutrophils: 55 %
Platelets: 226 10*3/uL (ref 150–379)
RBC: 3.93 x10E6/uL (ref 3.77–5.28)
RDW: 13.7 % (ref 12.3–15.4)
WBC: 6.6 10*3/uL (ref 3.4–10.8)

## 2016-05-18 LAB — TSH: TSH: 4.48 u[IU]/mL (ref 0.450–4.500)

## 2016-05-18 LAB — T4, FREE: Free T4: 1.18 ng/dL (ref 0.82–1.77)

## 2016-05-28 DIAGNOSIS — H8111 Benign paroxysmal vertigo, right ear: Secondary | ICD-10-CM | POA: Diagnosis not present

## 2016-06-03 ENCOUNTER — Other Ambulatory Visit: Payer: Self-pay | Admitting: Family Medicine

## 2016-06-03 DIAGNOSIS — F329 Major depressive disorder, single episode, unspecified: Secondary | ICD-10-CM

## 2016-06-03 DIAGNOSIS — J449 Chronic obstructive pulmonary disease, unspecified: Secondary | ICD-10-CM

## 2016-06-03 DIAGNOSIS — F32A Depression, unspecified: Secondary | ICD-10-CM

## 2016-06-04 DIAGNOSIS — H8110 Benign paroxysmal vertigo, unspecified ear: Secondary | ICD-10-CM | POA: Diagnosis not present

## 2016-06-04 DIAGNOSIS — H8111 Benign paroxysmal vertigo, right ear: Secondary | ICD-10-CM | POA: Diagnosis not present

## 2016-06-13 DIAGNOSIS — H353133 Nonexudative age-related macular degeneration, bilateral, advanced atrophic without subfoveal involvement: Secondary | ICD-10-CM | POA: Diagnosis not present

## 2016-06-25 DIAGNOSIS — Z23 Encounter for immunization: Secondary | ICD-10-CM | POA: Diagnosis not present

## 2016-06-25 DIAGNOSIS — J439 Emphysema, unspecified: Secondary | ICD-10-CM | POA: Diagnosis not present

## 2016-06-25 DIAGNOSIS — R0602 Shortness of breath: Secondary | ICD-10-CM | POA: Diagnosis not present

## 2016-07-02 ENCOUNTER — Telehealth: Payer: Self-pay | Admitting: Family Medicine

## 2016-07-02 NOTE — Telephone Encounter (Signed)
Michelle's pt's caregiver called questioning pt's appt Saturday for a pneumonia vaccine.  She stated it is very hard to get her here and she thinks pt has already had a pneumonia vaccine.  Call back # is [phone]  Thanks, Con Memos

## 2016-07-02 NOTE — Telephone Encounter (Signed)
Spoke with [employer], patient had her flu shot with Dr Raul Del last week (06/26/16 possibly), she had Pneumonia vaccine in 2002 per [facility] that was reported by patient no specific month or date. She had Prevnar per [facility] documentation. Appointment for Saturday cancelled, too soon after flu shot. Vicenta Dunning that we do not have a official record of her other pneumonia shot on file.-aa

## 2016-07-06 ENCOUNTER — Ambulatory Visit: Payer: Self-pay

## 2016-10-03 ENCOUNTER — Other Ambulatory Visit: Payer: Self-pay | Admitting: Family Medicine

## 2016-10-04 NOTE — Telephone Encounter (Signed)
Please review-aa 

## 2016-10-17 ENCOUNTER — Ambulatory Visit: Payer: Medicare Other | Admitting: Family Medicine

## 2016-11-22 ENCOUNTER — Other Ambulatory Visit: Payer: Self-pay | Admitting: Family Medicine

## 2016-11-22 DIAGNOSIS — F329 Major depressive disorder, single episode, unspecified: Secondary | ICD-10-CM

## 2016-11-22 DIAGNOSIS — F32A Depression, unspecified: Secondary | ICD-10-CM

## 2016-11-29 DIAGNOSIS — I10 Essential (primary) hypertension: Secondary | ICD-10-CM | POA: Diagnosis not present

## 2016-11-29 DIAGNOSIS — E782 Mixed hyperlipidemia: Secondary | ICD-10-CM | POA: Diagnosis not present

## 2016-11-29 DIAGNOSIS — I480 Paroxysmal atrial fibrillation: Secondary | ICD-10-CM | POA: Diagnosis not present

## 2016-11-29 DIAGNOSIS — I2510 Atherosclerotic heart disease of native coronary artery without angina pectoris: Secondary | ICD-10-CM | POA: Diagnosis not present

## 2016-11-29 DIAGNOSIS — I350 Nonrheumatic aortic (valve) stenosis: Secondary | ICD-10-CM | POA: Diagnosis not present

## 2016-12-17 DIAGNOSIS — I480 Paroxysmal atrial fibrillation: Secondary | ICD-10-CM | POA: Diagnosis not present

## 2016-12-17 DIAGNOSIS — I10 Essential (primary) hypertension: Secondary | ICD-10-CM | POA: Diagnosis not present

## 2016-12-17 DIAGNOSIS — J449 Chronic obstructive pulmonary disease, unspecified: Secondary | ICD-10-CM | POA: Diagnosis not present

## 2016-12-23 ENCOUNTER — Other Ambulatory Visit: Payer: Self-pay | Admitting: Family Medicine

## 2016-12-31 DIAGNOSIS — R7989 Other specified abnormal findings of blood chemistry: Secondary | ICD-10-CM | POA: Diagnosis not present

## 2017-01-14 ENCOUNTER — Encounter: Payer: Self-pay | Admitting: Family Medicine

## 2017-01-14 ENCOUNTER — Ambulatory Visit
Admission: RE | Admit: 2017-01-14 | Discharge: 2017-01-14 | Disposition: A | Discharge status: Home / Self Care | Payer: Medicare Other | Source: Ambulatory Visit | Attending: Family Medicine | Admitting: Family Medicine

## 2017-01-14 ENCOUNTER — Ambulatory Visit (INDEPENDENT_AMBULATORY_CARE_PROVIDER_SITE_OTHER): Payer: Medicare Other | Admitting: Family Medicine

## 2017-01-14 VITALS — BP 134/64 | HR 58 | Temp 97.7°F | Resp 16 | Wt 124.2 lb

## 2017-01-14 DIAGNOSIS — R1032 Left lower quadrant pain: Secondary | ICD-10-CM

## 2017-01-14 DIAGNOSIS — M8588 Other specified disorders of bone density and structure, other site: Secondary | ICD-10-CM | POA: Insufficient documentation

## 2017-01-14 DIAGNOSIS — N858 Other specified noninflammatory disorders of uterus: Secondary | ICD-10-CM | POA: Insufficient documentation

## 2017-01-14 DIAGNOSIS — I7389 Other specified peripheral vascular diseases: Secondary | ICD-10-CM | POA: Insufficient documentation

## 2017-01-14 DIAGNOSIS — R441 Visual hallucinations: Secondary | ICD-10-CM | POA: Diagnosis not present

## 2017-01-14 DIAGNOSIS — Z8739 Personal history of other diseases of the musculoskeletal system and connective tissue: Secondary | ICD-10-CM | POA: Diagnosis not present

## 2017-01-14 DIAGNOSIS — N183 Chronic kidney disease, stage 3 unspecified: Secondary | ICD-10-CM

## 2017-01-14 DIAGNOSIS — D259 Leiomyoma of uterus, unspecified: Secondary | ICD-10-CM | POA: Insufficient documentation

## 2017-01-14 DIAGNOSIS — M160 Bilateral primary osteoarthritis of hip: Secondary | ICD-10-CM | POA: Diagnosis not present

## 2017-01-14 IMAGING — CR DG HIP (WITH OR WITHOUT PELVIS) 2-3V*L*
1 series · 3 of 3 positions shown · non-contrast
Comparison: [DATE].

CLINICAL DATA: Left groin pain.  Prior falls.

EXAM:
DG HIP (WITH OR WITHOUT PELVIS) 2-3V LEFT

[Series 1: dg hip unilat w or w/o pelvis 2-3 views  · non-contrast · 0.14mm/px · 3 of 3 slices shown]
[im 1/3]
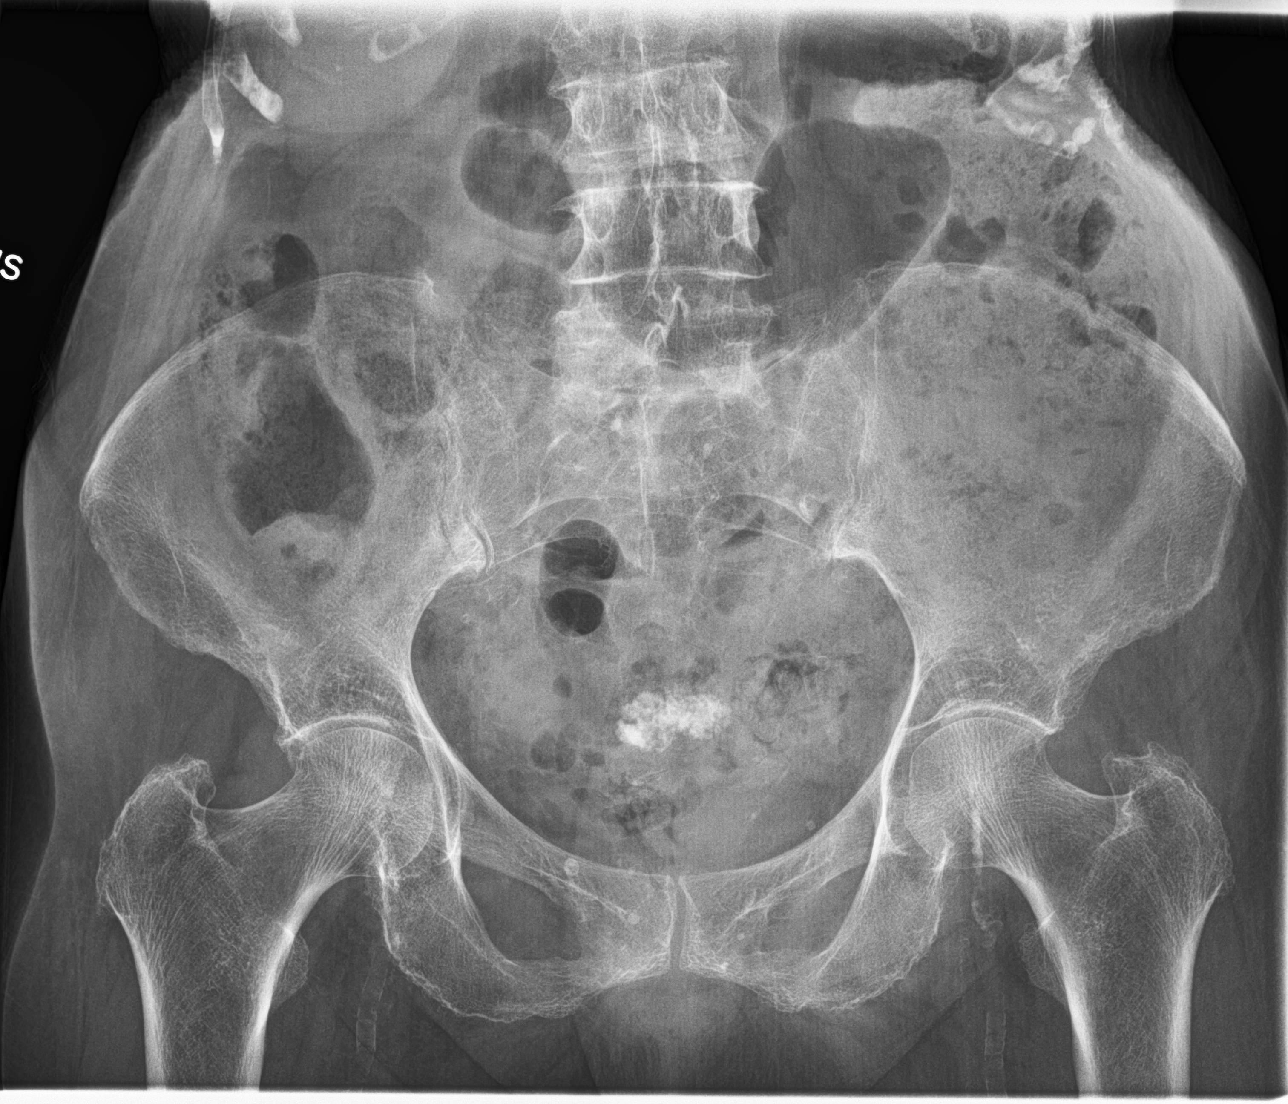
[im 2/3]
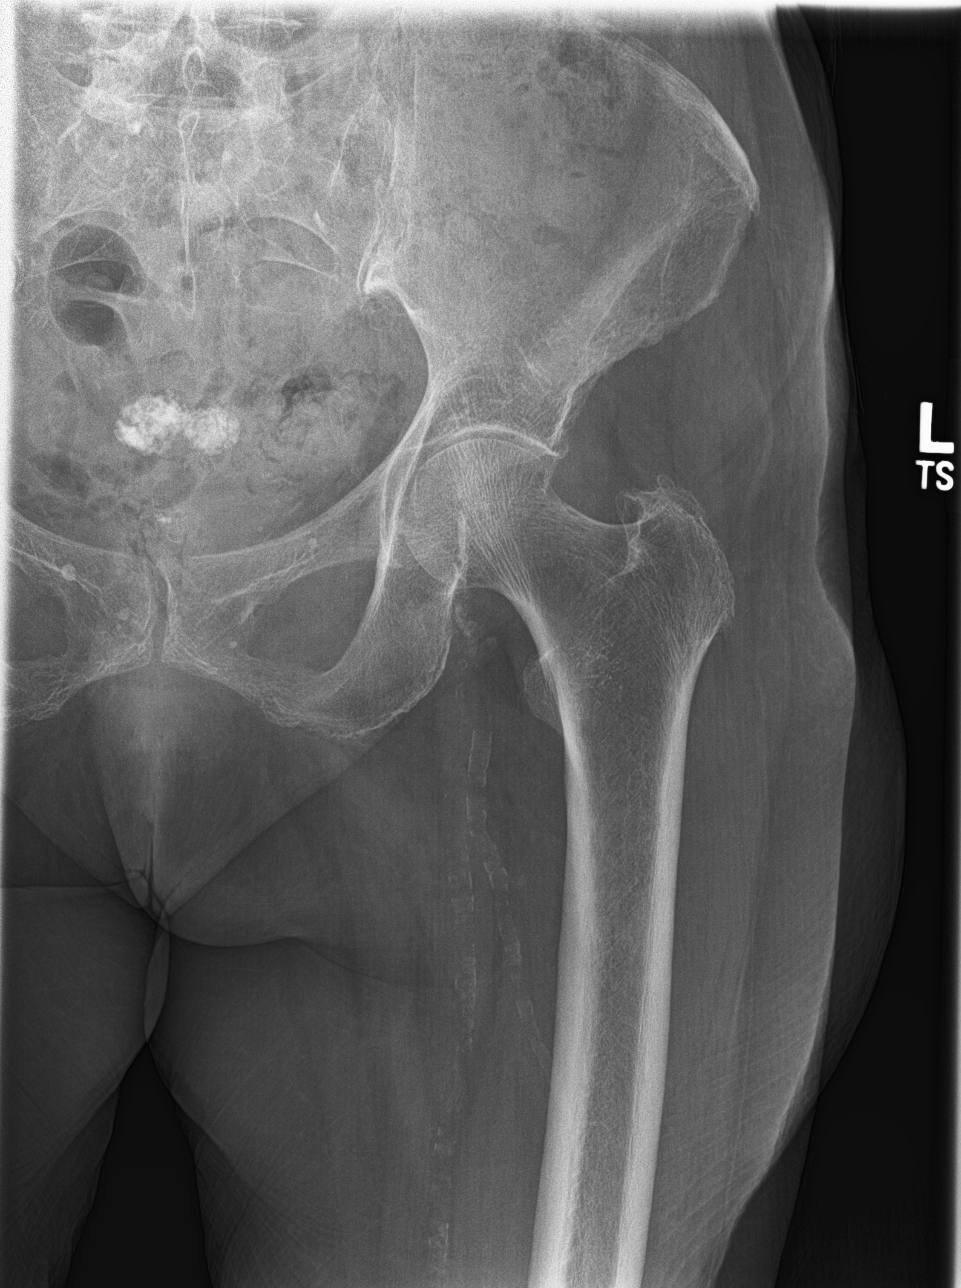
[im 3/3]
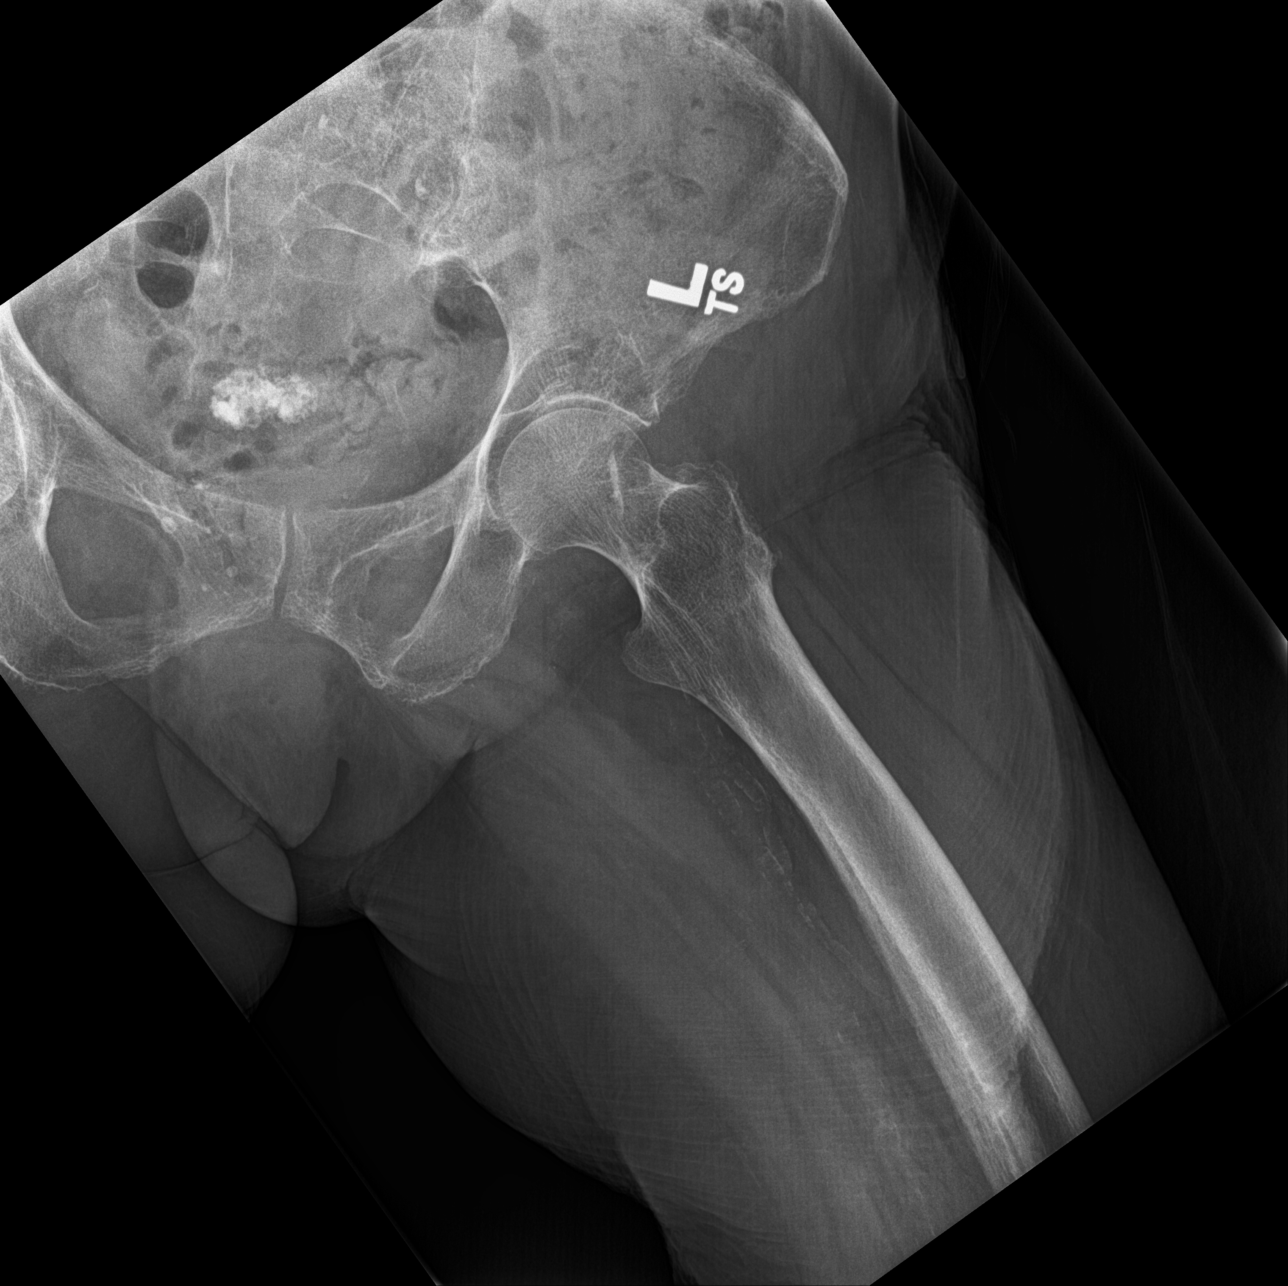

[3 of 3 positions shown; findings below may reference images not displayed]

FINDINGS: Diffuse osteopenia. Degenerative changes lumbar spine and both hips.
No acute bony abnormality. Calcified uterine fibroid. Peripheral
vascular calcification.
IMPRESSION: 1. Diffuse osteopenia. Degenerative changes lumbar spine and both
hips. No acute bony abnormality.

2. Calcified uterine fibroid.

3. Peripheral vascular disease.

## 2017-01-14 NOTE — Patient Instructions (Signed)
We will call you with the lab results and x-ray report.

## 2017-01-14 NOTE — Addendum Note (Signed)
Addended by: Quay Burow on: 01/14/2017 12:14 PM   Modules accepted: Orders

## 2017-01-14 NOTE — Progress Notes (Addendum)
Subjective:     Patient ID: Marilyn Oconnell, female   DOB: [birth year], 83 y.o.   MRN: [id number]  HPI  Chief Complaint  Patient presents with  . Insomnia    Patient returns for 6 month follow up last office visit was 04/16/16 patient was advised to continue Trazodone 50mg . Patient reports good compliance, tolerance and symptom control on medication.   . Chronic Kidney Disease    Patient returns for follow up from 01/25/16, at last office labs were ordered CMP and Uric Acid.  . Gout    Patient returns for follow up from 01/25/16, at last visit condtion was stable and improved. Patient states that she has not had any gout flares in the past several months and only take Allopurinol PRN.   . Fall    Patients family friend who is present today states that two weeks ago patient fell after slipping in home. Patient has sustained bruises and cutts on her left arm which she states is still healing.   . Groin Pain    Patient reports left side groin pain fior the past 3 months that she describes as achy. Patient states that after her fall 2 weeks ago pain in groin has become increased.   States she slipped on her outdoor step when going out to feed the birds. Skinned her left elbow which has scab/bandaids. States groin pain worsens with activity/w.b.but no change in bowel or bladder habits. Has used Tylenol for her sx. Recent labs per her pulmonary doctor note stable CKD 3. Reports less nocturnal visual hallucinations. Provided with handout regarding [name] for her niece, Michelle's review [phone]). She is accompanied by a friend, [name] today.   Review of Systems     Objective:   Physical Exam  Constitutional: She appears well-developed and well-nourished. No distress.  Abdominal: Soft. There is no tenderness. There is no guarding.  Musculoskeletal:  Left elbow with eschar. Non-tender to the touch.Can flex and extend without difficulty. Tender in her anterior ischial  area though no  increased pain with hip IR/ER/flexion.       Assessment:    1. Groin pain, left - DG HIP UNILAT WITH PELVIS 2-3 VIEWS LEFT; Future  2. History of gout - Uric acid  3. 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 Chronic kidney disease (CKD), stage III (moderate): stable    Plan:    Further f/u pending lab and x-ray results.

## 2017-01-15 ENCOUNTER — Telehealth: Payer: Self-pay

## 2017-01-15 ENCOUNTER — Other Ambulatory Visit: Payer: Self-pay | Admitting: Family Medicine

## 2017-01-15 DIAGNOSIS — Z8739 Personal history of other diseases of the musculoskeletal system and connective tissue: Secondary | ICD-10-CM

## 2017-01-15 LAB — URIC ACID: Uric Acid: 8.8 mg/dL — ABNORMAL HIGH (ref 2.5–7.1)

## 2017-01-15 NOTE — Telephone Encounter (Signed)
Niece was advised, she states in the past they had patient d/c simvastatin while on Allopurinol. Mrs. White would like to know if you want patient to d/c simvastatin or is it okay to take with medications? KW

## 2017-01-15 NOTE — Telephone Encounter (Signed)
Mrs. Marilyn Oconnell was advised, per Mikki Santee okay to return for repeat lab in 2-4weeks. KW

## 2017-01-15 NOTE — Telephone Encounter (Signed)
-----   Message from Carmon Ginsberg, [place] sent at 01/15/2017  7:35 AM EDT ----- Uric acid is high, would recommend taking allopurinol daily. May give information to her niece, Oren Section, as well as the patient.

## 2017-01-15 NOTE — Telephone Encounter (Signed)
I am not aware of a drug interaction between simvastatin and allopurinol so ok to take both.

## 2017-02-03 ENCOUNTER — Ambulatory Visit: Payer: Medicare Other | Admitting: Family Medicine

## 2017-02-05 ENCOUNTER — Encounter: Payer: Self-pay | Admitting: Family Medicine

## 2017-02-05 ENCOUNTER — Ambulatory Visit (INDEPENDENT_AMBULATORY_CARE_PROVIDER_SITE_OTHER): Payer: Medicare Other | Admitting: Family Medicine

## 2017-02-05 VITALS — BP 100/70 | HR 53 | Temp 97.5°F | Resp 16 | Wt 127.2 lb

## 2017-02-05 DIAGNOSIS — Z8739 Personal history of other diseases of the musculoskeletal system and connective tissue: Secondary | ICD-10-CM | POA: Diagnosis not present

## 2017-02-05 NOTE — Patient Instructions (Signed)
We will call you with the lab results. 

## 2017-02-05 NOTE — Progress Notes (Signed)
Subjective:     Patient ID: Marilyn Oconnell, female   DOB: 1924/08/30, 83 y.o.   MRN: [id number]  HPI  Chief Complaint  Patient presents with  . Abnormal Lab    Patient comes in office today with to address elevated Uric Acid level that was drawn on 01/14/17. Patients labs showed uric acid level of 8.8, patient was advised to continue Allopurinol. Patient denies any gout flares or pain in feet.   States she has been taking the allopurinol daily. Prior flares of gout have been in her great toe spreading to her foot. Accompanied by Jocelyn Lamer, today.   Review of Systems     Objective:   Physical Exam  Constitutional: She appears well-developed and well-nourished. No distress.       Assessment:    1. History of gout - Renal function panel - Uric acid    Plan:    Further f/u pending lab work.

## 2017-02-06 ENCOUNTER — Telehealth: Payer: Self-pay

## 2017-02-06 LAB — URIC ACID: Uric Acid: 4.9 mg/dL (ref 2.5–7.1)

## 2017-02-06 LAB — RENAL FUNCTION PANEL
Albumin: 3.9 g/dL (ref 3.2–4.6)
BUN/Creatinine Ratio: 13 (ref 12–28)
BUN: 16 mg/dL (ref 10–36)
CO2: 26 mmol/L (ref 18–29)
Calcium: 9 mg/dL (ref 8.7–10.3)
Chloride: 98 mmol/L (ref 96–106)
Creatinine, Ser: 1.21 mg/dL — ABNORMAL HIGH (ref 0.57–1.00)
GFR calc Af Amer: 46 mL/min/{1.73_m2} — ABNORMAL LOW (ref >59)
GFR calc non Af Amer: 39 mL/min/{1.73_m2} — ABNORMAL LOW (ref >59)
Glucose: 87 mg/dL (ref 65–99)
Phosphorus: 4.8 mg/dL — ABNORMAL HIGH (ref 2.5–4.5)
Potassium: 5.2 mmol/L (ref 3.5–5.2)
Sodium: 140 mmol/L (ref 134–144)

## 2017-02-06 NOTE — Telephone Encounter (Signed)
Patient and niece has been advised. KW

## 2017-02-06 NOTE — Telephone Encounter (Signed)
-----   Message from Carmon Ginsberg, [place] sent at 02/06/2017  7:39 AM EDT ----- Kidney function is stable and uric acid is much lower. Continue allopurinol 100 mg. Please call Oren Section (neice) with the results.

## 2017-02-18 ENCOUNTER — Ambulatory Visit (INDEPENDENT_AMBULATORY_CARE_PROVIDER_SITE_OTHER): Payer: Medicare Other | Admitting: Family Medicine

## 2017-02-18 ENCOUNTER — Encounter: Payer: Self-pay | Admitting: Family Medicine

## 2017-02-18 VITALS — BP 132/70 | HR 60 | Temp 97.4°F | Resp 16 | Wt 124.0 lb

## 2017-02-18 DIAGNOSIS — M79605 Pain in left leg: Secondary | ICD-10-CM

## 2017-02-18 NOTE — Progress Notes (Signed)
Subjective:     Patient ID: Marilyn Oconnell, female   DOB: [birth year], 83 y.o.   MRN: [id number]  HPI  Chief Complaint  Patient presents with  . Hip Pain    Left hip.    Has had prior hip x-ray in April with evidence of arthritic changes. States she gets groin to knee pain the first thing in the AM when she first bears weight. After taking Tylenol this gets better. Denies falling since x-ray. Accompanied by Jocelyn Lamer today.   Review of Systems     Objective:   Physical Exam  Constitutional: She appears well-developed and well-nourished. No distress.  Musculoskeletal:  No antalgic gait with s.p.cane. SLR's to 60 degrees without back pain or radiation of pain. Left hip IR/ER with mild discomfort in her leg. No left knee or trochanteric area tenderness appreciated. M.S.in lower extremities 5/5.       Assessment:    1. Left leg pain: due to arthritis    Plan:    Continue Tylenol as needed. Patient defers orthopedic referral. Urged her to use her assistive devices to limit possibility of fall.

## 2017-02-18 NOTE — Patient Instructions (Signed)
Continue to use Tylenol for leg pain. Call if you wish an orthopedic referral.

## 2017-02-20 ENCOUNTER — Other Ambulatory Visit: Payer: Self-pay | Admitting: Family Medicine

## 2017-02-20 DIAGNOSIS — F32A Depression, unspecified: Secondary | ICD-10-CM

## 2017-02-20 DIAGNOSIS — F329 Major depressive disorder, single episode, unspecified: Secondary | ICD-10-CM

## 2017-03-24 ENCOUNTER — Other Ambulatory Visit: Payer: Self-pay | Admitting: Family Medicine

## 2017-03-24 DIAGNOSIS — R441 Visual hallucinations: Secondary | ICD-10-CM

## 2017-03-24 DIAGNOSIS — G4700 Insomnia, unspecified: Secondary | ICD-10-CM

## 2017-04-09 ENCOUNTER — Other Ambulatory Visit: Payer: Self-pay | Admitting: Family Medicine

## 2017-04-22 ENCOUNTER — Other Ambulatory Visit: Payer: Self-pay | Admitting: Family Medicine

## 2017-05-12 ENCOUNTER — Other Ambulatory Visit: Payer: Self-pay | Admitting: Family Medicine

## 2017-05-12 ENCOUNTER — Telehealth: Payer: Self-pay | Admitting: Family Medicine

## 2017-05-12 DIAGNOSIS — E790 Hyperuricemia without signs of inflammatory arthritis and tophaceous disease: Secondary | ICD-10-CM

## 2017-05-12 NOTE — Telephone Encounter (Signed)
Marilyn Oconnell with [facility] advised. Marilyn Oconnell wanted to know when patient needed her uric acid labs rechecked. Per Mikki Santee within the next month. Marilyn Oconnell is requesting a lab slip be printed so they can have labs done on 9/10. Patient has another appointment the same day. Marilyn Oconnell CB# [phone]

## 2017-05-12 NOTE — Telephone Encounter (Signed)
Marilyn Oconnell with [facility] request a call back to ask if pt should continue taking the Rx allopurinol (ZYLOPRIM) 100 MG tablet. EK#[phone]

## 2017-05-12 NOTE — Telephone Encounter (Signed)
Lab slip printed at the front desk. Left [name] a message advising her that she may pick up the slip on 9/10 when patient goes to other appointments.

## 2017-05-12 NOTE — Telephone Encounter (Signed)
Please let them know she is to continue the allopurinol for gout prevention

## 2017-05-19 ENCOUNTER — Encounter: Payer: Self-pay | Admitting: Family Medicine

## 2017-05-19 ENCOUNTER — Ambulatory Visit (INDEPENDENT_AMBULATORY_CARE_PROVIDER_SITE_OTHER): Payer: Medicare Other | Admitting: Family Medicine

## 2017-05-19 VITALS — BP 104/72 | HR 58 | Temp 97.9°F | Resp 16 | Wt 125.8 lb

## 2017-05-19 DIAGNOSIS — E790 Hyperuricemia without signs of inflammatory arthritis and tophaceous disease: Secondary | ICD-10-CM | POA: Diagnosis not present

## 2017-05-19 DIAGNOSIS — R202 Paresthesia of skin: Secondary | ICD-10-CM | POA: Diagnosis not present

## 2017-05-19 DIAGNOSIS — R42 Dizziness and giddiness: Secondary | ICD-10-CM | POA: Diagnosis not present

## 2017-05-19 NOTE — Progress Notes (Signed)
Subjective:     Patient ID: KARITA DRALLE, female   DOB: [birth year], 83 y.o.   MRN: [id number]  HPI  Chief Complaint  Patient presents with  . Extremity Weakness    Patient comes in office today with concerns of weakness in her lower extremity since 05/14/17. Patient complains that when she walks it feels like her legs ar going to give out.   . Numbness    Patient complains of numbness intermittent in her right and and bottom lip on right side since 05/14/17. Patient denies difficulty with speech or slurred speech.   Also reports intermittent numbness and tingling in her right hand. All of her symptoms have been improving since sudden onset on 8/22. Denies rapid heart rate. Accompanied by family friend, [name]. Kaylyn ambulated in the office today with minimum assistance and no assistive device.   Review of Systems     Objective:   Physical Exam  Constitutional: She appears well-developed and well-nourished. No distress.  HENT:  Tongue is protruded at midline with good lateral movement. +gag.  Eyes: EOM are normal.  Pupils equal  Neck: Carotid bruit is not present.  Cardiovascular:  3/6 systolic murmur with late systole click  Musculoskeletal:  Grip strength and lower extremity strength 5/5.  Neurological: Coordination (finger to nose limited by vision and decreased shoulder range but did point to point without tremor) normal.       Assessment:    1. Dizziness: has prior lab slip for renal  Profile. - CT Head Wo Contrast; Future  2. Paresthesias in right hand and right lip: suspect TIA. - T4, free - TSH - CT Head Wo Contrast; Future    Plan:    Further f/u pending lab and CT scan results.

## 2017-05-19 NOTE — Patient Instructions (Signed)
We will call you with the lab results and Head Ct scan appointment.

## 2017-05-20 ENCOUNTER — Other Ambulatory Visit: Payer: Self-pay | Admitting: Family Medicine

## 2017-05-20 DIAGNOSIS — G459 Transient cerebral ischemic attack, unspecified: Secondary | ICD-10-CM

## 2017-05-20 LAB — RENAL FUNCTION PANEL
Albumin: 4.2 g/dL (ref 3.2–4.6)
BUN/Creatinine Ratio: 17 (ref 12–28)
BUN: 21 mg/dL (ref 10–36)
CO2: 25 mmol/L (ref 20–29)
Calcium: 9.6 mg/dL (ref 8.7–10.3)
Chloride: 96 mmol/L (ref 96–106)
Creatinine, Ser: 1.26 mg/dL — ABNORMAL HIGH (ref 0.57–1.00)
GFR calc Af Amer: 43 mL/min/{1.73_m2} — ABNORMAL LOW (ref >59)
GFR calc non Af Amer: 37 mL/min/{1.73_m2} — ABNORMAL LOW (ref >59)
Glucose: 92 mg/dL (ref 65–99)
Phosphorus: 4.2 mg/dL (ref 2.5–4.5)
Potassium: 4.3 mmol/L (ref 3.5–5.2)
Sodium: 138 mmol/L (ref 134–144)

## 2017-05-20 LAB — TSH: TSH: 4.16 u[IU]/mL (ref 0.450–4.500)

## 2017-05-20 LAB — URIC ACID: Uric Acid: 5.2 mg/dL (ref 2.5–7.1)

## 2017-05-20 LAB — T4, FREE: Free T4: 1.23 ng/dL (ref 0.82–1.77)

## 2017-05-21 ENCOUNTER — Telehealth: Payer: Self-pay

## 2017-05-21 NOTE — Telephone Encounter (Signed)
Yes continue the allopurinol as it is preventing gout gout attacks and joint damage.

## 2017-05-21 NOTE — Telephone Encounter (Signed)
Sharyn Lull advised.  She wanted to know if Ms. Silveri should continue the Allopurinol.  Thanks,   -Mickel Baas

## 2017-05-21 NOTE — Telephone Encounter (Signed)
-----   Message from Carmon Ginsberg, [place] sent at 05/21/2017  7:34 AM EDT ----- Thyroid and uric acid ok.Let her niece, Oren Section, know as well.

## 2017-05-21 NOTE — Telephone Encounter (Signed)
Michelle advised.  ? ?Thanks,  ? ?-Nataly Pacifico  ?

## 2017-05-27 ENCOUNTER — Ambulatory Visit: Payer: Medicare Other

## 2017-05-29 ENCOUNTER — Ambulatory Visit
Admission: RE | Admit: 2017-05-29 | Discharge: 2017-05-29 | Disposition: A | Discharge status: Home / Self Care | Payer: Medicare Other | Source: Ambulatory Visit | Attending: Family Medicine | Admitting: Family Medicine

## 2017-05-29 DIAGNOSIS — I6523 Occlusion and stenosis of bilateral carotid arteries: Secondary | ICD-10-CM | POA: Diagnosis not present

## 2017-05-29 DIAGNOSIS — I6782 Cerebral ischemia: Secondary | ICD-10-CM | POA: Insufficient documentation

## 2017-05-29 DIAGNOSIS — R42 Dizziness and giddiness: Secondary | ICD-10-CM | POA: Diagnosis not present

## 2017-05-29 DIAGNOSIS — R202 Paresthesia of skin: Secondary | ICD-10-CM | POA: Insufficient documentation

## 2017-05-29 DIAGNOSIS — G459 Transient cerebral ischemic attack, unspecified: Secondary | ICD-10-CM

## 2017-05-29 IMAGING — CT CT HEAD W/O CM
1 series · 16 of 28 positions shown, 20 images · non-contrast
Comparison: [DATE]

CLINICAL DATA: Worsening dizziness over the last 2-3 weeks.
Numbness of the right hand in both lower extremities.

EXAM:
CT HEAD WITHOUT CONTRAST
TECHNIQUE: Contiguous axial images were obtained from the base of the skull
through the vertex without intravenous contrast.

[Series 2: head wo · axial · 0.39mm/px · z∈[-62,+63]mm · 16 of 28 slices shown, 20 images]
[im 2/28  brain]
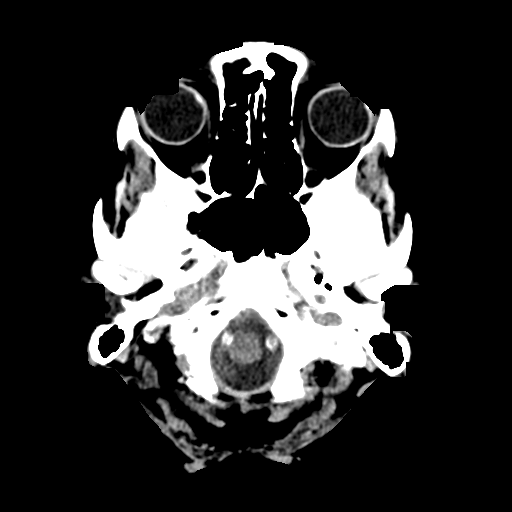
[im 2/28  bone]
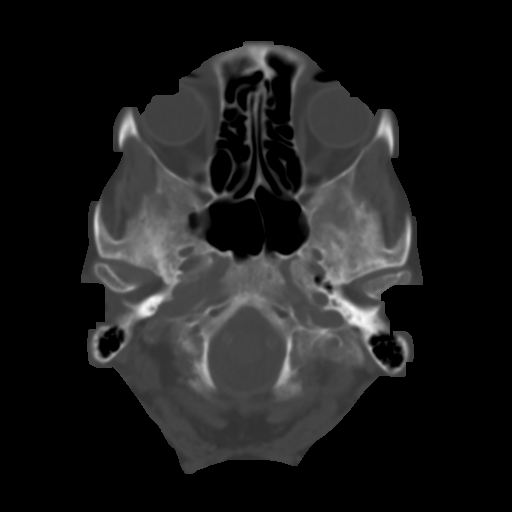
[im 4/28  brain]
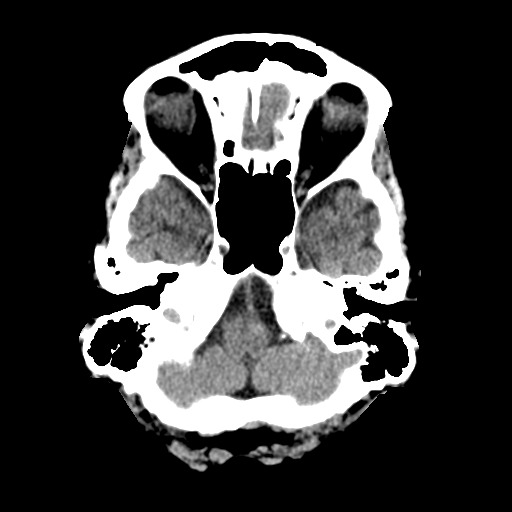
[im 6/28  brain]
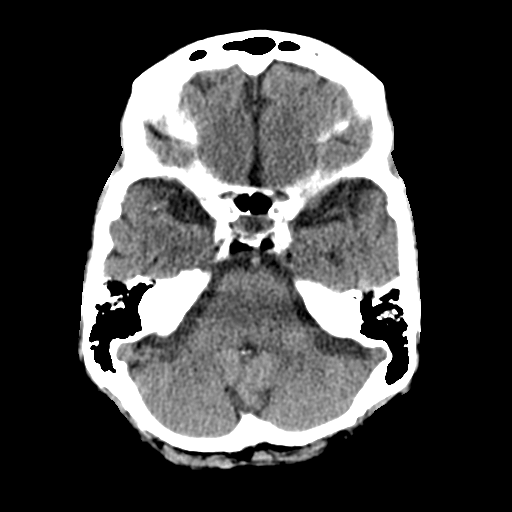
[im 7/28  brain]
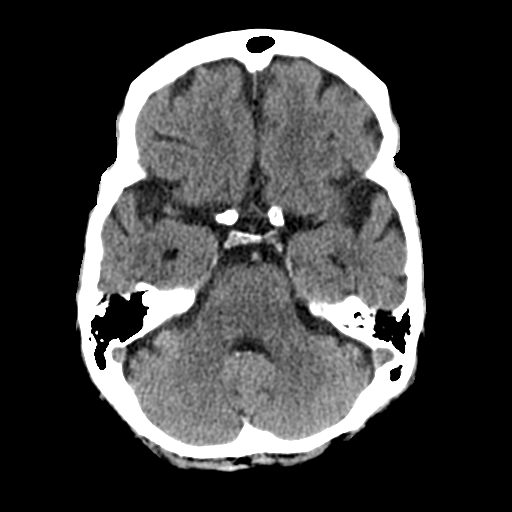
[im 9/28  brain]
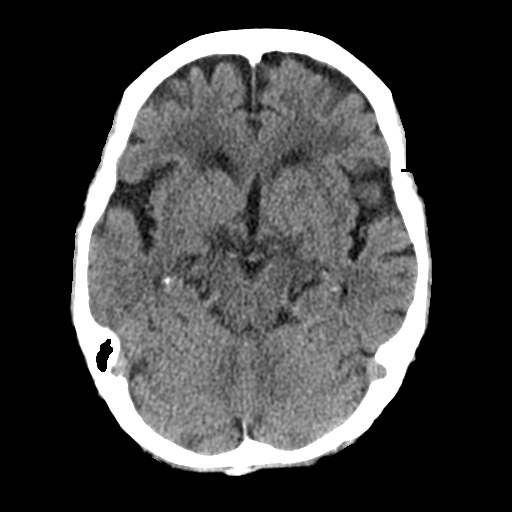
[im 9/28  bone]
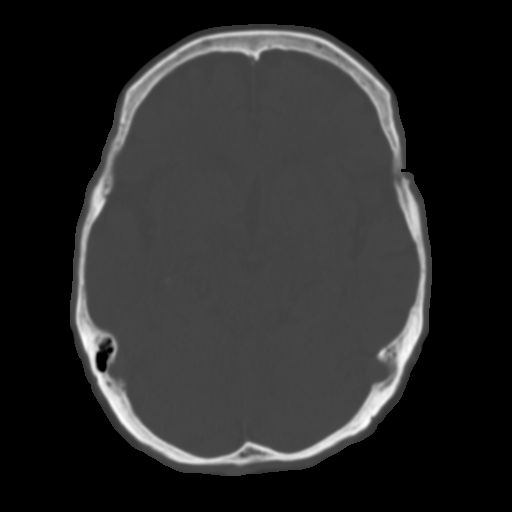
[im 10/28  brain]
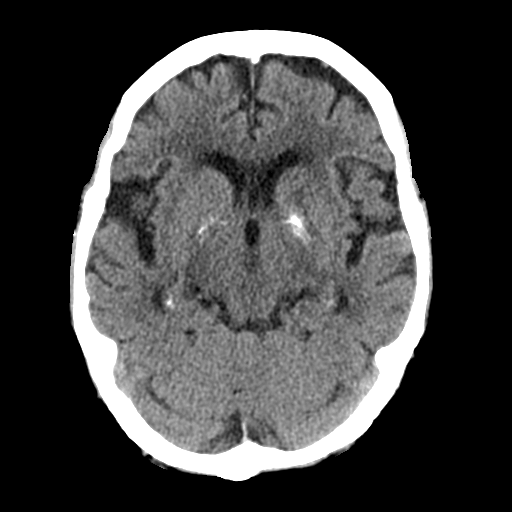
[im 12/28  brain]
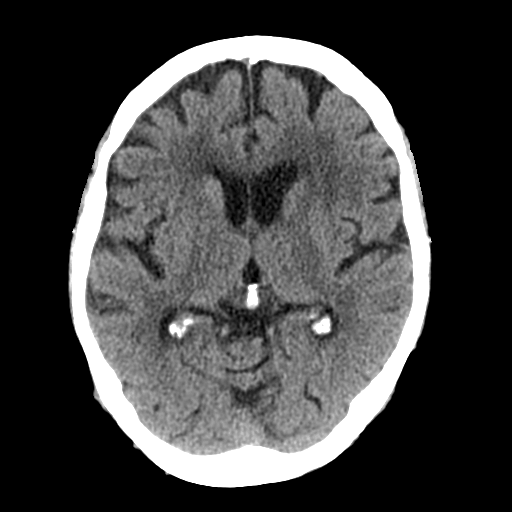
[im 14/28  brain]
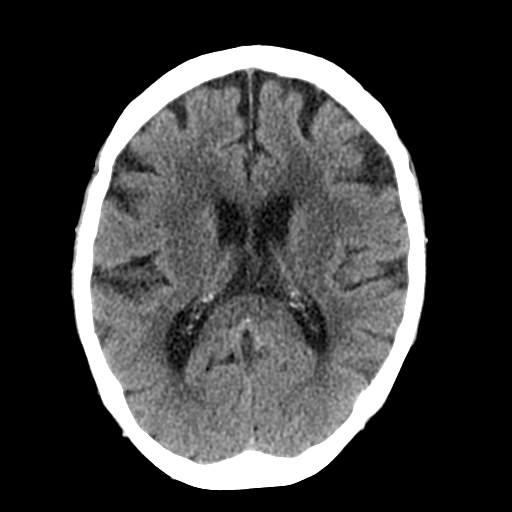
[im 15/28  brain]
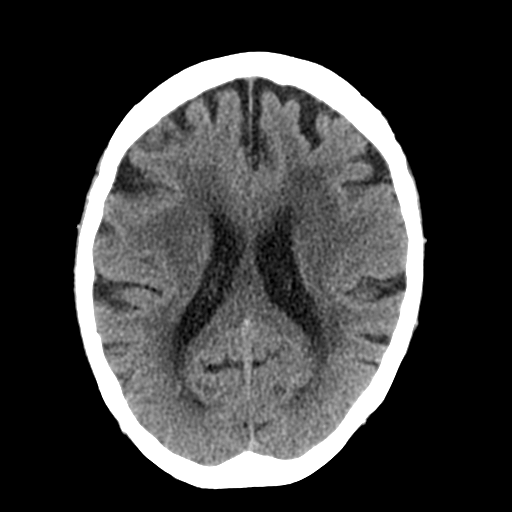
[im 15/28  bone]
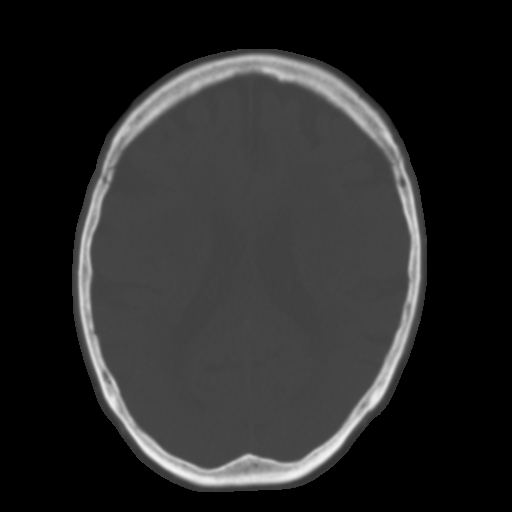
[im 17/28  brain]
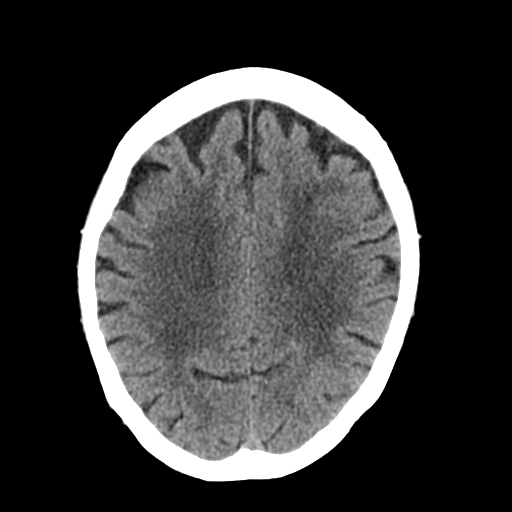
[im 19/28  brain]
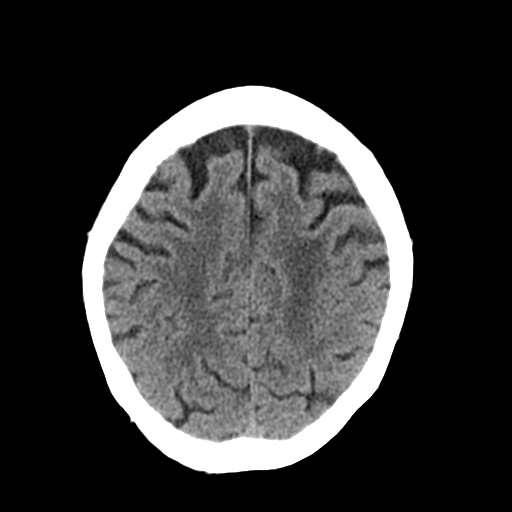
[im 20/28  brain]
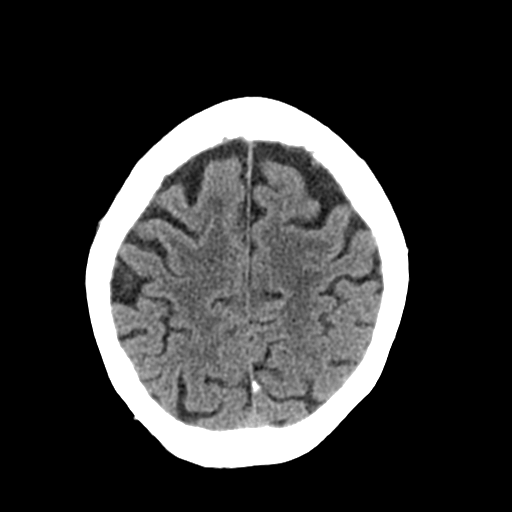
[im 22/28  brain]
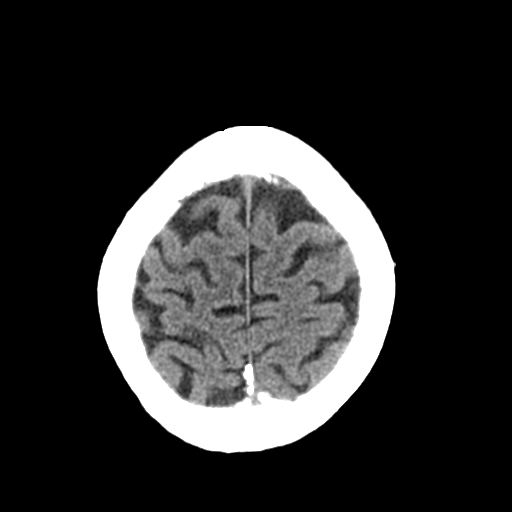
[im 22/28  bone]
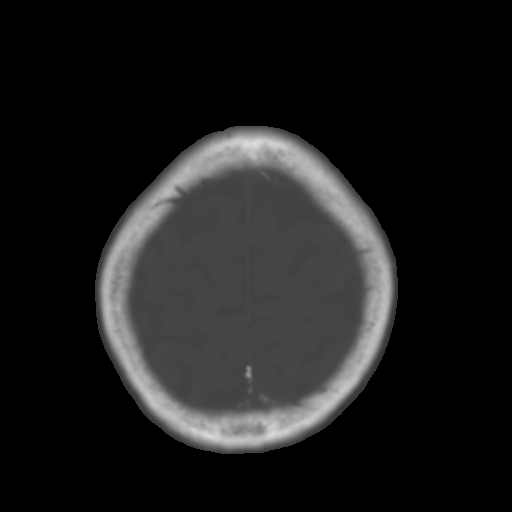
[im 23/28  brain]
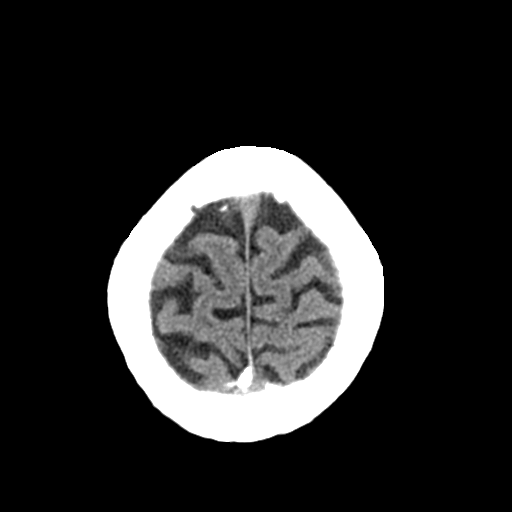
[im 25/28  brain]
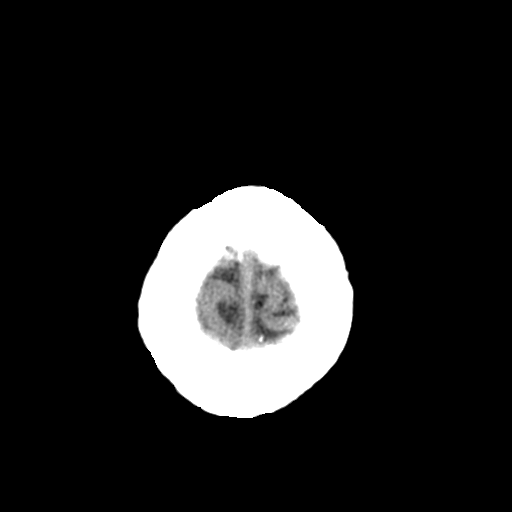
[im 27/28  brain]
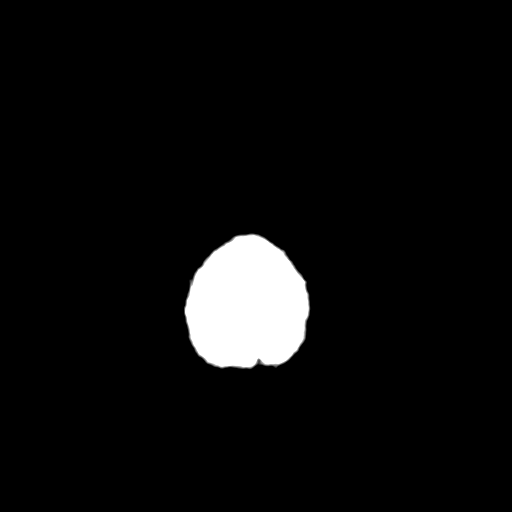

[16 of 28 positions shown; findings below may reference images not displayed]

FINDINGS: Brain: Moderate chronic appearing small vessel ischemic changes of
the cerebral hemispheric white matter. No sign of acute infarction,
mass lesion, hemorrhage, hydrocephalus or extra-axial collection.
Chronic basal ganglia calcification.

Vascular: There is atherosclerotic calcification of the major
vessels at the base of the brain.

Skull: Negative

Sinuses/Orbits: Clear/normal

Other: None significant
IMPRESSION: No acute or reversible finding. Chronic small-vessel ischemic
changes of the cerebral hemispheric white matter, similar to the
previous exam.

## 2017-06-02 DIAGNOSIS — E782 Mixed hyperlipidemia: Secondary | ICD-10-CM | POA: Diagnosis not present

## 2017-06-02 DIAGNOSIS — R0609 Other forms of dyspnea: Secondary | ICD-10-CM | POA: Diagnosis not present

## 2017-06-02 DIAGNOSIS — I10 Essential (primary) hypertension: Secondary | ICD-10-CM | POA: Diagnosis not present

## 2017-06-02 DIAGNOSIS — I341 Nonrheumatic mitral (valve) prolapse: Secondary | ICD-10-CM | POA: Diagnosis not present

## 2017-06-02 DIAGNOSIS — J8410 Pulmonary fibrosis, unspecified: Secondary | ICD-10-CM | POA: Diagnosis not present

## 2017-06-02 DIAGNOSIS — I2510 Atherosclerotic heart disease of native coronary artery without angina pectoris: Secondary | ICD-10-CM | POA: Diagnosis not present

## 2017-06-02 DIAGNOSIS — I480 Paroxysmal atrial fibrillation: Secondary | ICD-10-CM | POA: Diagnosis not present

## 2017-06-02 DIAGNOSIS — J449 Chronic obstructive pulmonary disease, unspecified: Secondary | ICD-10-CM | POA: Diagnosis not present

## 2017-06-02 DIAGNOSIS — Z952 Presence of prosthetic heart valve: Secondary | ICD-10-CM | POA: Diagnosis not present

## 2017-06-02 DIAGNOSIS — I350 Nonrheumatic aortic (valve) stenosis: Secondary | ICD-10-CM | POA: Diagnosis not present

## 2017-06-16 ENCOUNTER — Ambulatory Visit (INDEPENDENT_AMBULATORY_CARE_PROVIDER_SITE_OTHER): Payer: Medicare Other | Admitting: Family Medicine

## 2017-06-16 DIAGNOSIS — H5211 Myopia, right eye: Secondary | ICD-10-CM | POA: Diagnosis not present

## 2017-06-16 DIAGNOSIS — Z961 Presence of intraocular lens: Secondary | ICD-10-CM | POA: Diagnosis not present

## 2017-06-16 DIAGNOSIS — H43813 Vitreous degeneration, bilateral: Secondary | ICD-10-CM | POA: Diagnosis not present

## 2017-06-16 DIAGNOSIS — H52223 Regular astigmatism, bilateral: Secondary | ICD-10-CM | POA: Diagnosis not present

## 2017-06-16 DIAGNOSIS — H5202 Hypermetropia, left eye: Secondary | ICD-10-CM | POA: Diagnosis not present

## 2017-06-16 DIAGNOSIS — Z23 Encounter for immunization: Secondary | ICD-10-CM | POA: Diagnosis not present

## 2017-06-16 DIAGNOSIS — H353133 Nonexudative age-related macular degeneration, bilateral, advanced atrophic without subfoveal involvement: Secondary | ICD-10-CM | POA: Diagnosis not present

## 2017-06-20 ENCOUNTER — Other Ambulatory Visit: Payer: Self-pay | Admitting: Family Medicine

## 2017-06-20 NOTE — Telephone Encounter (Signed)
Please review. KW 

## 2017-07-10 ENCOUNTER — Telehealth: Payer: Self-pay | Admitting: Family Medicine

## 2017-07-10 ENCOUNTER — Encounter: Payer: Self-pay | Admitting: Family Medicine

## 2017-07-10 ENCOUNTER — Ambulatory Visit (INDEPENDENT_AMBULATORY_CARE_PROVIDER_SITE_OTHER): Payer: Medicare Other | Admitting: Family Medicine

## 2017-07-10 VITALS — BP 112/84 | HR 56 | Temp 98.0°F | Resp 16 | Wt 125.8 lb

## 2017-07-10 DIAGNOSIS — R42 Dizziness and giddiness: Secondary | ICD-10-CM | POA: Diagnosis not present

## 2017-07-10 DIAGNOSIS — R112 Nausea with vomiting, unspecified: Secondary | ICD-10-CM | POA: Diagnosis not present

## 2017-07-10 LAB — COMPLETE METABOLIC PANEL WITH GFR
AG Ratio: 1.4 (calc) (ref 1.0–2.5)
ALT: 8 U/L (ref 6–29)
AST: 13 U/L (ref 10–35)
Albumin: 3.9 g/dL (ref 3.6–5.1)
Alkaline phosphatase (APISO): 77 U/L (ref 33–130)
BUN/Creatinine Ratio: 17 (calc) (ref 6–22)
BUN: 20 mg/dL (ref 7–25)
CO2: 30 mmol/L (ref 20–32)
Calcium: 9.5 mg/dL (ref 8.6–10.4)
Chloride: 100 mmol/L (ref 98–110)
Creat: 1.15 mg/dL — ABNORMAL HIGH (ref 0.60–0.88)
GFR, Est African American: 48 mL/min/{1.73_m2} — ABNORMAL LOW (ref >=60)
GFR, Est Non African American: 42 mL/min/{1.73_m2} — ABNORMAL LOW (ref >=60)
Globulin: 2.7 g/dL (calc) (ref 1.9–3.7)
Glucose, Bld: 159 mg/dL — ABNORMAL HIGH (ref 65–99)
Potassium: 4.4 mmol/L (ref 3.5–5.3)
Sodium: 139 mmol/L (ref 135–146)
Total Bilirubin: 0.4 mg/dL (ref 0.2–1.2)
Total Protein: 6.6 g/dL (ref 6.1–8.1)

## 2017-07-10 LAB — CBC WITH DIFFERENTIAL/PLATELET
Basophils Absolute: 50 cells/uL (ref 0–200)
Basophils Relative: 0.5 %
Eosinophils Absolute: 130 cells/uL (ref 15–500)
Eosinophils Relative: 1.3 %
HCT: 35.5 % (ref 35.0–45.0)
Hemoglobin: 12 g/dL (ref 11.7–15.5)
Lymphs Abs: 870 cells/uL (ref 850–3900)
MCH: 31 pg (ref 27.0–33.0)
MCHC: 33.8 g/dL (ref 32.0–36.0)
MCV: 91.7 fL (ref 80.0–100.0)
MPV: 11.5 fL (ref 7.5–12.5)
Monocytes Relative: 3.8 %
Neutro Abs: 8570 cells/uL — ABNORMAL HIGH (ref 1500–7800)
Neutrophils Relative %: 85.7 %
Platelets: 187 10*3/uL (ref 140–400)
RBC: 3.87 10*6/uL (ref 3.80–5.10)
RDW: 12.3 % (ref 11.0–15.0)
Total Lymphocyte: 8.7 %
WBC mixed population: 380 cells/uL (ref 200–950)
WBC: 10 10*3/uL (ref 3.8–10.8)

## 2017-07-10 MED ORDER — ONDANSETRON HCL 4 MG PO TABS: 4.0000 mg | ORAL_TABLET | Freq: Three times a day (TID) | ORAL | 0 refills | Status: DC | PRN

## 2017-07-10 NOTE — Telephone Encounter (Signed)
Spoke with niece on phone who states that a hour and half ago patient complained of dizziness and began vomiting, niece denied any fall or injury, symptoms of numbness confusion, chest pain or difficulty when moving around. Appt was scheduled this morning at 11:30 for evaluation. KW

## 2017-07-10 NOTE — Patient Instructions (Signed)
We will call you with the lab results. Hold the fluid pill (furosemide) since you are vomiting and losing fluid.

## 2017-07-10 NOTE — Progress Notes (Signed)
Subjective:     Patient ID: Marilyn Oconnell, female   DOB: 1924-09-20, 83 y.o.   MRN: [id number]  HPI  Chief Complaint  Patient presents with  . Dizziness    Patient comes into office today with concerns of dizziness and vomiting since 9AM.   States when she got up this AM she felt fine. Not definite about this being true vertigo and states it has lessened since this AM. Has vomited x 3 today. No numbness or tingling. Moving bowels well without abdominal pain. Accompanied by caregiver, Josephina Gip. She has an ENT appointment pending tomorrow.   Review of Systems     Objective:   Physical Exam  Constitutional: She appears well-developed and well-nourished. She has a sickly appearance.  Ambulating without assistance with s.p. Cane.  HENT:  Ear canals patent;TM's without inflammation  Eyes: EOM are normal.  Eye exam sub-optimal due to aphakic pupils and macular degeneration  Abdominal: Soft. There is no tenderness.  Musculoskeletal: She exhibits no edema (of lower extremities).  Grip strength 5/5 symmetrically  Neurological: Coordination (finger to nose WNL (limited due to poor vision)) normal.       Assessment:    1. Dizziness - COMPLETE METABOLIC PANEL WITH GFR - CBC with Differential/Platelet  2. Nausea and vomiting, intractability of vomiting not specified, unspecified vomiting type - COMPLETE METABOLIC PANEL WITH GFR - ondansetron (ZOFRAN) 4 MG tablet; Take 1 tablet (4 mg total) by mouth every 8 (eight) hours as needed for nausea or vomiting.  Dispense: 12 tablet; Refill: 0    Plan:    Further f/u pending lab work.

## 2017-07-10 NOTE — Telephone Encounter (Signed)
Pt's niece Oren Section contacted office, stated pt is dizzy and vomiting. Sharyn Lull advised that she would rather bring pt into see Mikki Santee and not go to ED. Sharyn Lull request a call back. Please advise. Thanks TNP

## 2017-07-11 DIAGNOSIS — R42 Dizziness and giddiness: Secondary | ICD-10-CM | POA: Diagnosis not present

## 2017-07-11 DIAGNOSIS — H903 Sensorineural hearing loss, bilateral: Secondary | ICD-10-CM | POA: Diagnosis not present

## 2017-08-20 ENCOUNTER — Other Ambulatory Visit: Payer: Self-pay | Admitting: Family Medicine

## 2017-09-08 DIAGNOSIS — I10 Essential (primary) hypertension: Secondary | ICD-10-CM | POA: Diagnosis not present

## 2017-09-08 DIAGNOSIS — I341 Nonrheumatic mitral (valve) prolapse: Secondary | ICD-10-CM | POA: Diagnosis not present

## 2017-09-08 DIAGNOSIS — Z952 Presence of prosthetic heart valve: Secondary | ICD-10-CM | POA: Diagnosis not present

## 2017-09-08 DIAGNOSIS — I350 Nonrheumatic aortic (valve) stenosis: Secondary | ICD-10-CM | POA: Diagnosis not present

## 2017-09-08 DIAGNOSIS — I2510 Atherosclerotic heart disease of native coronary artery without angina pectoris: Secondary | ICD-10-CM | POA: Diagnosis not present

## 2017-09-08 DIAGNOSIS — I480 Paroxysmal atrial fibrillation: Secondary | ICD-10-CM | POA: Diagnosis not present

## 2017-09-08 DIAGNOSIS — E782 Mixed hyperlipidemia: Secondary | ICD-10-CM | POA: Diagnosis not present

## 2017-10-16 ENCOUNTER — Other Ambulatory Visit: Payer: Self-pay | Admitting: Family Medicine

## 2017-11-19 ENCOUNTER — Other Ambulatory Visit: Payer: Self-pay | Admitting: Family Medicine

## 2017-12-08 DIAGNOSIS — I350 Nonrheumatic aortic (valve) stenosis: Secondary | ICD-10-CM | POA: Diagnosis not present

## 2017-12-08 DIAGNOSIS — J310 Chronic rhinitis: Secondary | ICD-10-CM | POA: Diagnosis not present

## 2017-12-08 DIAGNOSIS — J8410 Pulmonary fibrosis, unspecified: Secondary | ICD-10-CM | POA: Diagnosis not present

## 2017-12-08 DIAGNOSIS — J449 Chronic obstructive pulmonary disease, unspecified: Secondary | ICD-10-CM | POA: Diagnosis not present

## 2017-12-17 ENCOUNTER — Other Ambulatory Visit: Payer: Self-pay | Admitting: Family Medicine

## 2017-12-17 DIAGNOSIS — F32A Depression, unspecified: Secondary | ICD-10-CM

## 2017-12-17 DIAGNOSIS — F329 Major depressive disorder, single episode, unspecified: Secondary | ICD-10-CM

## 2017-12-23 ENCOUNTER — Other Ambulatory Visit: Payer: Self-pay | Admitting: Family Medicine

## 2017-12-23 NOTE — Telephone Encounter (Addendum)
[facility] faxed a refill request for the following medications. Thanks CC  furosemide (LASIX) 20 MG tablet   metoprolol succinate (TOPROL-XL) 100 MG 24 hr tablet   traZODone (DESYREL) 50 MG tablet   simvastatin (ZOCOR) 20 MG tablet   ADVAIR DISKUS 250-50 MCG/DOSE AEPB

## 2017-12-23 NOTE — Telephone Encounter (Signed)
She has been using [facility] in [place]. Please have her schedule an office visit this week so we can recheck her and confirm which medication she is still taking.

## 2017-12-23 NOTE — Telephone Encounter (Signed)
Patient states that she uses [facility] for her [facility] prescriptions. Patient has scheduled appt to be seen for follow up on 12/26/17. KW

## 2017-12-23 NOTE — Telephone Encounter (Signed)
Please review. KW 

## 2017-12-25 ENCOUNTER — Telehealth: Payer: Self-pay | Admitting: Family Medicine

## 2017-12-25 NOTE — Telephone Encounter (Signed)
[facility] faxed a refill request for the following medications. Thanks CC  ADVAIR DISKUS 250-50 MCG/DOSE AEPB   traZODone (DESYREL) 50 MG tablet   simvastatin (ZOCOR) 20 MG tablet   escitalopram (LEXAPRO) 10 MG tablet   furosemide (LASIX) 20 MG tablet   metoprolol succinate (TOPROL-XL) 100 MG 24 hr tablet    allopurinol (ZYLOPRIM) 100 MG tablet

## 2017-12-25 NOTE — Telephone Encounter (Signed)
Pt. Has appt scheduled with you tomorrow. KW

## 2017-12-26 ENCOUNTER — Encounter: Payer: Self-pay | Admitting: Family Medicine

## 2017-12-26 ENCOUNTER — Ambulatory Visit (INDEPENDENT_AMBULATORY_CARE_PROVIDER_SITE_OTHER): Payer: Medicare Other | Admitting: Family Medicine

## 2017-12-26 VITALS — BP 102/64 | HR 62 | Temp 98.0°F | Resp 16 | Wt 125.0 lb

## 2017-12-26 DIAGNOSIS — N183 Chronic kidney disease, stage 3 unspecified: Secondary | ICD-10-CM

## 2017-12-26 DIAGNOSIS — F32A Depression, unspecified: Secondary | ICD-10-CM

## 2017-12-26 DIAGNOSIS — I10 Essential (primary) hypertension: Secondary | ICD-10-CM

## 2017-12-26 DIAGNOSIS — F320 Major depressive disorder, single episode, mild: Secondary | ICD-10-CM

## 2017-12-26 DIAGNOSIS — J449 Chronic obstructive pulmonary disease, unspecified: Secondary | ICD-10-CM | POA: Diagnosis not present

## 2017-12-26 DIAGNOSIS — R441 Visual hallucinations: Secondary | ICD-10-CM | POA: Diagnosis not present

## 2017-12-26 DIAGNOSIS — Z8739 Personal history of other diseases of the musculoskeletal system and connective tissue: Secondary | ICD-10-CM | POA: Diagnosis not present

## 2017-12-26 DIAGNOSIS — E782 Mixed hyperlipidemia: Secondary | ICD-10-CM

## 2017-12-26 DIAGNOSIS — I480 Paroxysmal atrial fibrillation: Secondary | ICD-10-CM | POA: Diagnosis not present

## 2017-12-26 DIAGNOSIS — F329 Major depressive disorder, single episode, unspecified: Secondary | ICD-10-CM | POA: Diagnosis not present

## 2017-12-26 DIAGNOSIS — G4700 Insomnia, unspecified: Secondary | ICD-10-CM

## 2017-12-26 MED ORDER — FLUTICASONE-SALMETEROL 250-50 MCG/DOSE IN AEPB
INHALATION_SPRAY | RESPIRATORY_TRACT | 3 refills | Status: DC
Start: 2017-12-26 — End: 2020-03-22

## 2017-12-26 MED ORDER — SIMVASTATIN 20 MG PO TABS: 20.0000 mg | ORAL_TABLET | Freq: Every day | ORAL | 3 refills | Status: DC

## 2017-12-26 MED ORDER — METOPROLOL SUCCINATE ER 50 MG PO TB24: 50.0000 mg | ORAL_TABLET | Freq: Every day | ORAL | 3 refills | Status: DC

## 2017-12-26 MED ORDER — TRAZODONE HCL 50 MG PO TABS: 25.0000 mg | ORAL_TABLET | Freq: Every day | ORAL | 1 refills | Status: DC

## 2017-12-26 MED ORDER — ESCITALOPRAM OXALATE 10 MG PO TABS: 10.0000 mg | ORAL_TABLET | Freq: Every day | ORAL | 3 refills | Status: DC

## 2017-12-26 MED ORDER — ALLOPURINOL 100 MG PO TABS: ORAL_TABLET | ORAL | 3 refills | Status: DC

## 2017-12-26 MED ORDER — FUROSEMIDE 20 MG PO TABS: 20.0000 mg | ORAL_TABLET | Freq: Every day | ORAL | 3 refills | Status: DC

## 2017-12-26 NOTE — Progress Notes (Signed)
Subjective:     Patient ID: Marilyn Oconnell, female   DOB: 08-Jun-1924, 84 y.o.   MRN: [id number] Chief Complaint  Patient presents with  . Follow-up    Patient returns to office today for follow up of medications, patient states that she is changing over to mail order and would like new prescriptions sent in. Patient states that she has had good compliance on medications and there has been no changes.    HPI States she has fallen a couple of times when she has been outside. No dizziness-just turned suddenly on one occasion and was unbalanced on another occasion while holding a flower pot. Not using her cane or walker consistently when she goes outside. Has been following up with pulmonary, Dr. Raul Del and cardiology, Dr.Fath. States she has been taking 1/2 metoprolol Xl 100 due to prior hx of dizziness. Accompanied by family friend, [name].  Review of Systems  Psychiatric/Behavioral:       Wishes to remain on Lexapro.       Objective:   Physical Exam  Constitutional: She appears well-developed and well-nourished. No distress.  Cardiovascular:  2/6 blowing systolic murmur per baseline  Pulmonary/Chest: Breath sounds normal.  Musculoskeletal: She exhibits no edema (of lower extremities).       Assessment:    1. Chronic kidney disease (CKD), stage III (moderate) (HCC) - Renal function panel  2. Insomnia, unspecified type: refilled trazodone  3. History of gout: refilled allopurinol  4. Essential hypertension - furosemide (LASIX) 20 MG tablet; Take 1 tablet (20 mg total) by mouth daily.  Dispense: 90 tablet; Refill: 3  5. Paroxysmal atrial fibrillation with RVR (HCC) - metoprolol succinate (TOPROL-XL) 50 MG 24 hr tablet; Take 1 tablet (50 mg total) by mouth daily.  Dispense: 90 tablet; Refill: 3  6. Asthma-chronic obstructive pulmonary disease overlap syndrome (HCC: refilled Advair  7. Current mild episode of major depressive disorder, unspecified whether recurrent ([place]): refilled  escitalopram 8. Visual hallucinations - traZODone (DESYREL) 50 MG tablet; Take 0.5 tablets (25 mg total) by mouth at bedtime.  Dispense: 90 tablet; Refill: 1  9. Mixed hyperlipidemia - simvastatin (ZOCOR) 20 MG tablet; Take 1 tablet (20 mg total) by mouth at bedtime.  Dispense: 90 tablet; Refill: 3    Plan:   Further f/u pending lab results. Use cane or walker when outside.

## 2017-12-26 NOTE — Patient Instructions (Signed)
We will call you with the lab results. Please use your cane or walker when you go outside.

## 2017-12-27 LAB — RENAL FUNCTION PANEL
Albumin: 4.2 g/dL (ref 3.2–4.6)
BUN/Creatinine Ratio: 22 (ref 12–28)
BUN: 28 mg/dL (ref 10–36)
CO2: 24 mmol/L (ref 20–29)
Calcium: 9.4 mg/dL (ref 8.7–10.3)
Chloride: 102 mmol/L (ref 96–106)
Creatinine, Ser: 1.25 mg/dL — ABNORMAL HIGH (ref 0.57–1.00)
GFR calc Af Amer: 43 mL/min/{1.73_m2} — ABNORMAL LOW (ref >59)
GFR calc non Af Amer: 38 mL/min/{1.73_m2} — ABNORMAL LOW (ref >59)
Glucose: 86 mg/dL (ref 65–99)
Phosphorus: 3.5 mg/dL (ref 2.5–4.5)
Potassium: 4.5 mmol/L (ref 3.5–5.2)
Sodium: 142 mmol/L (ref 134–144)

## 2017-12-29 ENCOUNTER — Telehealth: Payer: Self-pay

## 2017-12-29 NOTE — Telephone Encounter (Signed)
-----   Message from Carmon Ginsberg, [place] sent at 12/29/2017  7:26 AM EDT ----- Kidney function is stable. Sugar ok.

## 2017-12-29 NOTE — Telephone Encounter (Signed)
Patient advised.KW 

## 2018-03-05 DIAGNOSIS — I2510 Atherosclerotic heart disease of native coronary artery without angina pectoris: Secondary | ICD-10-CM | POA: Diagnosis not present

## 2018-03-05 DIAGNOSIS — I480 Paroxysmal atrial fibrillation: Secondary | ICD-10-CM | POA: Diagnosis not present

## 2018-03-05 DIAGNOSIS — E782 Mixed hyperlipidemia: Secondary | ICD-10-CM | POA: Diagnosis not present

## 2018-03-05 DIAGNOSIS — I350 Nonrheumatic aortic (valve) stenosis: Secondary | ICD-10-CM | POA: Diagnosis not present

## 2018-03-05 DIAGNOSIS — I10 Essential (primary) hypertension: Secondary | ICD-10-CM | POA: Diagnosis not present

## 2018-06-08 DIAGNOSIS — J439 Emphysema, unspecified: Secondary | ICD-10-CM | POA: Diagnosis not present

## 2018-06-08 DIAGNOSIS — R0609 Other forms of dyspnea: Secondary | ICD-10-CM | POA: Diagnosis not present

## 2018-06-08 DIAGNOSIS — J849 Interstitial pulmonary disease, unspecified: Secondary | ICD-10-CM | POA: Diagnosis not present

## 2018-07-14 ENCOUNTER — Ambulatory Visit (INDEPENDENT_AMBULATORY_CARE_PROVIDER_SITE_OTHER): Payer: Medicare Other | Admitting: Family Medicine

## 2018-07-14 ENCOUNTER — Encounter: Payer: Self-pay | Admitting: Family Medicine

## 2018-07-14 VITALS — BP 110/64 | HR 64 | Temp 98.2°F | Resp 15 | Wt 126.2 lb

## 2018-07-14 DIAGNOSIS — N183 Chronic kidney disease, stage 3 unspecified: Secondary | ICD-10-CM

## 2018-07-14 DIAGNOSIS — F3289 Other specified depressive episodes: Secondary | ICD-10-CM | POA: Diagnosis not present

## 2018-07-14 DIAGNOSIS — E782 Mixed hyperlipidemia: Secondary | ICD-10-CM

## 2018-07-14 DIAGNOSIS — J449 Chronic obstructive pulmonary disease, unspecified: Secondary | ICD-10-CM | POA: Diagnosis not present

## 2018-07-14 DIAGNOSIS — I10 Essential (primary) hypertension: Secondary | ICD-10-CM

## 2018-07-14 DIAGNOSIS — Z23 Encounter for immunization: Secondary | ICD-10-CM

## 2018-07-14 DIAGNOSIS — Z8739 Personal history of other diseases of the musculoskeletal system and connective tissue: Secondary | ICD-10-CM | POA: Diagnosis not present

## 2018-07-14 DIAGNOSIS — R42 Dizziness and giddiness: Secondary | ICD-10-CM | POA: Diagnosis not present

## 2018-07-14 DIAGNOSIS — H8110 Benign paroxysmal vertigo, unspecified ear: Secondary | ICD-10-CM | POA: Diagnosis not present

## 2018-07-14 MED ORDER — ESCITALOPRAM OXALATE 20 MG PO TABS: 20.0000 mg | ORAL_TABLET | Freq: Every day | ORAL | 1 refills | Status: DC

## 2018-07-14 NOTE — Progress Notes (Signed)
  Subjective:     Patient ID: Marilyn Oconnell, female   DOB: 01/25/1924, 84 y.o.   MRN: [id number] Chief Complaint  Patient presents with  . Chronic Kidney Disease    Stage 3 follow up visit Renal anel last ordered 12/26/17.  Marland Kitchen Hypertension    Patient returns to office today for 6 month follow up, blood pressure in house was 102/64. Patient states that blood pressure has been checked outside office and remains normal. Patient reports good compliance and tolerance on medication.   . Atrial Fibrillation    Follow up from 12/26/17, patient reports good compliance and tolerance on Toprol XL  . Hyperlipidemia    Follow up from 12/26/17, patient reports good compliance on Simvastatin.    HPI She is up to date with cardiology, Marilyn Oconnell and pulmonary, Marilyn Oconnell. 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 Reports mild increase in fatigue and depressive sx. She is accompanied by her friend, Marilyn Oconnell. Will receive both flu and shingles vaccines today.  Review of Systems     Objective:   Physical Exam  Constitutional: She appears well-developed and well-nourished. No distress.  Cardiovascular: Normal rate and regular rhythm.  Murmur (3/6 systolic) heard. Pulmonary/Chest: She has no wheezes.  Basilar coarse crackles c/w fibrosis dx  Musculoskeletal: She exhibits no edema (of distal lower extremities).       Assessment:    1. Need for influenza vaccination: high dose  2. Essential hypertension: per cardiiolog  3. 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 Chronic kidney disease (CKD), stage III (moderate) (HCC) - Comprehensive metabolic panel  5. Mixed hyperlipidemia: on  - Lipid panel  6. History of gout - Uric acid  7. Other depression: increase medication - escitalopram (LEXAPRO) 20 MG tablet; Take 1 tablet (20 mg total) by mouth daily.  Dispense: 90  tablet; Refill: 1  8. Need for shingles vaccine: Shingrix    Plan:    Further f/u pending lab work and in 4 weeks re: depression. Second Shingrix vaccine in > 2 months.

## 2018-07-14 NOTE — Patient Instructions (Signed)
Get the second shingles vaccine in > 2 months. We will call you with the lab results. Let me know in 4 weeks how you are doing on the depression medication.

## 2018-07-14 NOTE — Addendum Note (Signed)
Addended by: Quay Burow on: 07/14/2018 11:08 AM   Modules accepted: Orders

## 2018-07-15 ENCOUNTER — Telehealth: Payer: Self-pay

## 2018-07-15 LAB — CBC WITH DIFFERENTIAL/PLATELET
Basophils Absolute: 0.1 10*3/uL (ref 0.0–0.2)
Basos: 1 %
EOS (ABSOLUTE): 0.3 10*3/uL (ref 0.0–0.4)
Eos: 4 %
Hematocrit: 33.4 % — ABNORMAL LOW (ref 34.0–46.6)
Hemoglobin: 11.2 g/dL (ref 11.1–15.9)
Immature Grans (Abs): 0 10*3/uL (ref 0.0–0.1)
Immature Granulocytes: 0 %
Lymphocytes Absolute: 1.5 10*3/uL (ref 0.7–3.1)
Lymphs: 18 %
MCH: 31.1 pg (ref 26.6–33.0)
MCHC: 33.5 g/dL (ref 31.5–35.7)
MCV: 93 fL (ref 79–97)
Monocytes Absolute: 0.7 10*3/uL (ref 0.1–0.9)
Monocytes: 9 %
Neutrophils Absolute: 5.5 10*3/uL (ref 1.4–7.0)
Neutrophils: 68 %
Platelets: 196 10*3/uL (ref 150–450)
RBC: 3.6 x10E6/uL — ABNORMAL LOW (ref 3.77–5.28)
RDW: 12.3 % (ref 12.3–15.4)
WBC: 8.2 10*3/uL (ref 3.4–10.8)

## 2018-07-15 LAB — COMPREHENSIVE METABOLIC PANEL
ALT: 9 IU/L (ref 0–32)
AST: 17 IU/L (ref 0–40)
Albumin/Globulin Ratio: 1.7 (ref 1.2–2.2)
Albumin: 4 g/dL (ref 3.2–4.6)
Alkaline Phosphatase: 73 IU/L (ref 39–117)
BUN/Creatinine Ratio: 15 (ref 12–28)
BUN: 19 mg/dL (ref 10–36)
Bilirubin Total: 0.2 mg/dL (ref 0.0–1.2)
CO2: 24 mmol/L (ref 20–29)
Calcium: 9.4 mg/dL (ref 8.7–10.3)
Chloride: 102 mmol/L (ref 96–106)
Creatinine, Ser: 1.27 mg/dL — ABNORMAL HIGH (ref 0.57–1.00)
GFR calc Af Amer: 42 mL/min/{1.73_m2} — ABNORMAL LOW (ref >59)
GFR calc non Af Amer: 37 mL/min/{1.73_m2} — ABNORMAL LOW (ref >59)
Globulin, Total: 2.3 g/dL (ref 1.5–4.5)
Glucose: 97 mg/dL (ref 65–99)
Potassium: 4.7 mmol/L (ref 3.5–5.2)
Sodium: 142 mmol/L (ref 134–144)
Total Protein: 6.3 g/dL (ref 6.0–8.5)

## 2018-07-15 LAB — LIPID PANEL
Chol/HDL Ratio: 2.7 ratio (ref 0.0–4.4)
Cholesterol, Total: 166 mg/dL (ref 100–199)
HDL: 62 mg/dL (ref >39)
LDL Calculated: 86 mg/dL (ref 0–99)
Triglycerides: 88 mg/dL (ref 0–149)
VLDL Cholesterol Cal: 18 mg/dL (ref 5–40)

## 2018-07-15 LAB — URIC ACID: Uric Acid: 5.3 mg/dL (ref 2.5–7.1)

## 2018-07-15 NOTE — Telephone Encounter (Signed)
Pt's niece returned your call  Thanks  Con Memos

## 2018-07-15 NOTE — Telephone Encounter (Signed)
Patient's niece Sharyn Lull advised.

## 2018-07-15 NOTE — Telephone Encounter (Signed)
-----   Message from Carmon Ginsberg, [place] sent at 07/15/2018  7:28 AM EDT ----- Labs are ok, kidney status is stable. Please also notify her niece, Oren Section.

## 2018-07-15 NOTE — Telephone Encounter (Signed)
LMTCB

## 2018-09-03 DIAGNOSIS — I341 Nonrheumatic mitral (valve) prolapse: Secondary | ICD-10-CM | POA: Diagnosis not present

## 2018-09-03 DIAGNOSIS — I2510 Atherosclerotic heart disease of native coronary artery without angina pectoris: Secondary | ICD-10-CM | POA: Diagnosis not present

## 2018-09-03 DIAGNOSIS — I10 Essential (primary) hypertension: Secondary | ICD-10-CM | POA: Diagnosis not present

## 2018-09-03 DIAGNOSIS — I350 Nonrheumatic aortic (valve) stenosis: Secondary | ICD-10-CM | POA: Diagnosis not present

## 2018-09-03 DIAGNOSIS — I480 Paroxysmal atrial fibrillation: Secondary | ICD-10-CM | POA: Diagnosis not present

## 2018-10-29 ENCOUNTER — Other Ambulatory Visit: Payer: Self-pay | Admitting: Family Medicine

## 2018-10-29 DIAGNOSIS — I480 Paroxysmal atrial fibrillation: Secondary | ICD-10-CM

## 2018-12-31 ENCOUNTER — Encounter: Payer: Self-pay | Admitting: Family Medicine

## 2018-12-31 ENCOUNTER — Telehealth: Payer: Self-pay | Admitting: Family Medicine

## 2018-12-31 NOTE — Telephone Encounter (Signed)
Pt's niece called saying her aunt needs a letter asking for the postal service to place a mailbox close to her house.  She is 39 and can not walk because she is visually impaired.  It is not safe for her to walk to the mailbox.  This is requested by the [facility] post office post master  Niece's CB#  [phone]  Thanks Con Memos

## 2018-12-31 NOTE — Telephone Encounter (Signed)
Done

## 2019-01-04 ENCOUNTER — Telehealth: Payer: Self-pay | Admitting: *Deleted

## 2019-01-04 NOTE — Telephone Encounter (Signed)
Yes I completed it on 12/31/2018 it's under letters tab.

## 2019-01-04 NOTE — Telephone Encounter (Signed)
Patient's niece call back concerning letter requested on 12/31/2018. Letter can be faxed to Attn: Oren Section [phone].

## 2019-01-04 NOTE — Telephone Encounter (Signed)
Please Advise

## 2019-01-13 ENCOUNTER — Ambulatory Visit: Payer: Self-pay | Admitting: Family Medicine

## 2019-01-13 ENCOUNTER — Ambulatory Visit: Payer: Medicare Other | Admitting: Physician Assistant

## 2019-01-15 ENCOUNTER — Other Ambulatory Visit: Payer: Self-pay | Admitting: Family Medicine

## 2019-01-15 DIAGNOSIS — F3289 Other specified depressive episodes: Secondary | ICD-10-CM

## 2019-01-15 DIAGNOSIS — G4700 Insomnia, unspecified: Secondary | ICD-10-CM

## 2019-01-15 MED ORDER — ESCITALOPRAM OXALATE 20 MG PO TABS: 20.0000 mg | ORAL_TABLET | Freq: Every day | ORAL | 0 refills | Status: DC

## 2019-01-15 MED ORDER — ALLOPURINOL 100 MG PO TABS: ORAL_TABLET | ORAL | 0 refills | Status: DC

## 2019-01-15 NOTE — Telephone Encounter (Signed)
Refilled x 3 months but patient will need to establish with new provider in office.

## 2019-01-15 NOTE — Telephone Encounter (Deleted)
e

## 2019-01-15 NOTE — Telephone Encounter (Addendum)
[facility] faxed refill request for the following medications:  escitalopram (LEXAPRO) 20 MG tablet  allopurinol (ZYLOPRIM) 100 MG tablet    Please advise.

## 2019-01-15 NOTE — Telephone Encounter (Signed)
Bob's old patient L.O.V. 07/14/2018, Please advise.

## 2019-01-19 ENCOUNTER — Other Ambulatory Visit: Payer: Self-pay | Admitting: Family Medicine

## 2019-01-19 DIAGNOSIS — R441 Visual hallucinations: Secondary | ICD-10-CM

## 2019-01-19 DIAGNOSIS — I10 Essential (primary) hypertension: Secondary | ICD-10-CM

## 2019-01-19 DIAGNOSIS — G4700 Insomnia, unspecified: Secondary | ICD-10-CM

## 2019-01-19 DIAGNOSIS — E782 Mixed hyperlipidemia: Secondary | ICD-10-CM

## 2019-01-19 MED ORDER — FUROSEMIDE 20 MG PO TABS: 20.0000 mg | ORAL_TABLET | Freq: Every day | ORAL | 0 refills | Status: DC

## 2019-01-19 MED ORDER — TRAZODONE HCL 50 MG PO TABS: 25.0000 mg | ORAL_TABLET | Freq: Every day | ORAL | 0 refills | Status: DC

## 2019-01-19 MED ORDER — SIMVASTATIN 20 MG PO TABS: 20.0000 mg | ORAL_TABLET | Freq: Every day | ORAL | 0 refills | Status: DC

## 2019-01-19 NOTE — Telephone Encounter (Addendum)
[facility] faxed refill request for the following medications:  simvastatin (ZOCOR) 20 MG tablet  furosemide (LASIX) 20 MG tablet traZODone (DESYREL) 50 MG tablet  Please advise.  Thanks, [employer]

## 2019-01-20 NOTE — Telephone Encounter (Signed)
Patient was advised and schedule appointment with [facility] 04/16/2019 @ 9:40 AM.

## 2019-02-24 ENCOUNTER — Telehealth: Payer: Self-pay

## 2019-02-24 NOTE — Telephone Encounter (Signed)
Called patient to ask about her symptoms before coming to office on Friday. Patient reports that she has been feeling more fatigue and unsteady for about a week. Patient reports eating, sleeping well and denies any headaches or falls. Patient does want to come in on Friday.

## 2019-02-25 NOTE — Telephone Encounter (Signed)
Noted, thanks!

## 2019-02-26 ENCOUNTER — Ambulatory Visit (INDEPENDENT_AMBULATORY_CARE_PROVIDER_SITE_OTHER): Payer: Medicare Other | Admitting: Physician Assistant

## 2019-02-26 ENCOUNTER — Encounter: Payer: Self-pay | Admitting: Physician Assistant

## 2019-02-26 ENCOUNTER — Other Ambulatory Visit: Payer: Self-pay

## 2019-02-26 VITALS — BP 171/68 | HR 63 | Temp 98.0°F | Resp 16 | Wt 123.0 lb

## 2019-02-26 DIAGNOSIS — E039 Hypothyroidism, unspecified: Secondary | ICD-10-CM | POA: Diagnosis not present

## 2019-02-26 DIAGNOSIS — R5383 Other fatigue: Secondary | ICD-10-CM | POA: Diagnosis not present

## 2019-02-26 DIAGNOSIS — R2681 Unsteadiness on feet: Secondary | ICD-10-CM | POA: Diagnosis not present

## 2019-02-26 DIAGNOSIS — R42 Dizziness and giddiness: Secondary | ICD-10-CM

## 2019-02-26 DIAGNOSIS — R7989 Other specified abnormal findings of blood chemistry: Secondary | ICD-10-CM | POA: Diagnosis not present

## 2019-02-26 NOTE — Patient Instructions (Signed)

## 2019-02-26 NOTE — Progress Notes (Signed)
Patient: Marilyn Oconnell Female    DOB: 11/09/1923   85 y.o.   MRN: [id number] Visit Date: 02/26/2019  Today's Provider: Trinna Post, PA-C   Chief Complaint  Patient presents with  . Dizziness   Subjective:   Patient is a 85 year old woman with a history of an AVR 2/2 aortic valve stenosis, paroxysmal a-fib, COPD and vertigo presents for dizziness. She reports that she has a history of vertigo and has been to ENT before but that these episodes feel different. She does not feel faint and denies passing out. However, she reports that at times she will be walking and feel unsteady and grab onto the wall. She has bilateral cataracts and possibly some peripheral neuropathy. She does not lose vision below her baseline during these episodes nor does she experience palpitations.   She last saw her cardiologist Dr. Ubaldo Glassing at [facility] on 03/05/2018. Aortic valve replacement in 2006 at [facility]. She had mentioned shortness of breath at that time too, etiology of which was felt to be unclear. At time of evaluation, it was felt her aortic valve was stenotic but she was not a candidate for valve replacement at that time. Her mitral valve prolapse was stable.   Dizziness  This is a recurrent problem. The current episode started more than 1 month ago. The problem occurs daily. The problem has been unchanged. Associated symptoms include fatigue and vertigo. Pertinent negatives include no abdominal pain, anorexia, arthralgias, change in bowel habit, chest pain, chills, congestion, coughing, diaphoresis, fever, headaches, joint swelling, myalgias, nausea, neck pain, numbness, rash, sore throat, swollen glands, urinary symptoms, visual change, vomiting or weakness. Nothing aggravates the symptoms. She has tried nothing for the symptoms.    Allergies  Allergen Reactions  . Iodine Anaphylaxis  . Shellfish Allergy Anaphylaxis  . Azithromycin Hives  . Sulfa Antibiotics Hives     Current Outpatient  Medications:  .  acetaminophen (TYLENOL) 500 MG tablet, Take 500-1,000 mg by mouth 2 (two) times daily as needed for mild pain, moderate pain, fever or headache. , Disp: , Rfl:  .  albuterol (PROVENTIL) (2.5 MG/3ML) 0.083% nebulizer solution, Take 2.5 mg by nebulization every 6 (six) hours as needed for wheezing or shortness of breath., Disp: , Rfl:  .  allopurinol (ZYLOPRIM) 100 MG tablet, TAKE (1) TABLET BY MOUTH ONCE DAILY FOR GOUT., Disp: 90 tablet, Rfl: 0 .  aspirin EC 81 MG tablet, Take 81 mg by mouth daily., Disp: , Rfl:  .  escitalopram (LEXAPRO) 20 MG tablet, Take 1 tablet (20 mg total) by mouth daily., Disp: 90 tablet, Rfl: 0 .  Fluticasone-Salmeterol (ADVAIR DISKUS) 250-50 MCG/DOSE AEPB, USE ONE (1) INHALATION BY MOUTH EVERY 12 HOURS. RINSE MOUTH OUT AFTERUSE., Disp: 180 each, Rfl: 3 .  furosemide (LASIX) 20 MG tablet, Take 1 tablet (20 mg total) by mouth daily., Disp: 90 tablet, Rfl: 0 .  ipratropium-albuterol (DUONEB) 0.5-2.5 (3) MG/3ML SOLN, Take 3 mLs by nebulization 3 (three) times daily., Disp: , Rfl:  .  metoprolol succinate (TOPROL-XL) 50 MG 24 hr tablet, TAKE 1 TABLET BY MOUTH  DAILY, Disp: 90 tablet, Rfl: 3 .  simvastatin (ZOCOR) 20 MG tablet, Take 1 tablet (20 mg total) by mouth at bedtime., Disp: 90 tablet, Rfl: 0 .  traZODone (DESYREL) 50 MG tablet, Take 0.5 tablets (25 mg total) by mouth at bedtime., Disp: 90 tablet, Rfl: 0 .  fluticasone (FLONASE) 50 MCG/ACT nasal spray, Place into the nose., Disp: ,  Rfl:   Review of Systems  Constitutional: Positive for fatigue. Negative for chills, diaphoresis and fever.  HENT: Negative for congestion and sore throat.   Respiratory: Negative for cough.   Cardiovascular: Negative for chest pain.  Gastrointestinal: Negative for abdominal pain, anorexia, change in bowel habit, nausea and vomiting.  Musculoskeletal: Negative for arthralgias, joint swelling, myalgias and neck pain.  Skin: Negative for rash.  Neurological: Positive for  dizziness and vertigo. Negative for weakness, numbness and headaches.    Social History   Tobacco Use  . Smoking status: Never Smoker  . Smokeless tobacco: Never Used  Substance Use Topics  . Alcohol use: No      Objective:   BP (!) 171/68   Pulse 63   Temp 98 F (36.7 C) (Oral)   Resp 16   Wt 123 lb (55.8 kg)   BMI 23.24 kg/m  Vitals:   02/26/19 1116  BP: (!) 171/68  Pulse: 63  Resp: 16  Temp: 98 F (36.7 C)  TempSrc: Oral  Weight: 123 lb (55.8 kg)     Physical Exam Constitutional:      Appearance: Normal appearance.  Eyes:     Pupils: Pupils are equal, round, and reactive to light.  Cardiovascular:     Rate and Rhythm: Normal rate and regular rhythm.     Heart sounds: Murmur present. Systolic murmur present with a grade of 3/6.  Skin:    General: Skin is warm and dry.  Neurological:     Mental Status: She is alert and oriented to person, place, and time. Mental status is at baseline.     Comments: Walks with a cane.  Psychiatric:        Mood and Affect: Mood normal.        Behavior: Behavior normal.         Assessment & Plan    1. Dizziness  EKG printed out incorrectly with incomplete leads on read out. From available leads her rhythm is normal and there is no evidence of ischemia. Offered to redo EKG but patient and caregiver politely decline.   We have talked about multiple causes of dizziness. It is possible that her aortic valve stenosis is worsening. She would need to follow up with cardiology regarding this. She was not felt to be a candidate for replacement last year, though this may change if her symptoms worsen. She is at an advanced age with multiple comorbidites.   From her history though, it sounds like she is more unsteady than dizzy. She is ambulating with a cane and has bilateral cataracts. Have suggested using a walker and home health evaluation with physical therapy. Patient and caregiver agreeable to this.   We will check labwork  below to make sure this is stable.   - EKG 12-Lead - Comprehensive Metabolic Panel (CMET) - CBC with Differential - TSH - Ambulatory referral to Home Health  2. Unsteady gait  - Ambulatory referral to Home Health  3. Other fatigue  - TSH  The entirety of the information documented in the History of Present Illness, Review of Systems and Physical Exam were personally obtained by me. Portions of this information were initially documented by Jennings Books, CMA and reviewed by me for thoroughness and accuracy.   F/u RPN   I have spent 25 minutes with this patient, >50% of which was spent on counseling and coordination of care.      Trinna Post, PA-C  [facility] [employer]  as a scribe for Trinna Post, PA-C.,have documented all relevant documentation on the behalf of Trinna Post, PA-C,as directed by  Trinna Post, PA-C while in the presence of Trinna Post, PA-C. [place]

## 2019-02-27 LAB — CBC WITH DIFFERENTIAL/PLATELET
Basophils Absolute: 0.1 10*3/uL (ref 0.0–0.2)
Basos: 1 %
EOS (ABSOLUTE): 0.2 10*3/uL (ref 0.0–0.4)
Eos: 3 %
Hematocrit: 33.2 % — ABNORMAL LOW (ref 34.0–46.6)
Hemoglobin: 11.4 g/dL (ref 11.1–15.9)
Immature Grans (Abs): 0 10*3/uL (ref 0.0–0.1)
Immature Granulocytes: 0 %
Lymphocytes Absolute: 1.4 10*3/uL (ref 0.7–3.1)
Lymphs: 21 %
MCH: 31.9 pg (ref 26.6–33.0)
MCHC: 34.3 g/dL (ref 31.5–35.7)
MCV: 93 fL (ref 79–97)
Monocytes Absolute: 0.5 10*3/uL (ref 0.1–0.9)
Monocytes: 8 %
Neutrophils Absolute: 4.4 10*3/uL (ref 1.4–7.0)
Neutrophils: 67 %
Platelets: 195 10*3/uL (ref 150–450)
RBC: 3.57 x10E6/uL — ABNORMAL LOW (ref 3.77–5.28)
RDW: 12.3 % (ref 11.7–15.4)
WBC: 6.6 10*3/uL (ref 3.4–10.8)

## 2019-02-27 LAB — COMPREHENSIVE METABOLIC PANEL
ALT: 8 IU/L (ref 0–32)
AST: 16 IU/L (ref 0–40)
Albumin/Globulin Ratio: 1.7 (ref 1.2–2.2)
Albumin: 4.7 g/dL — ABNORMAL HIGH (ref 3.5–4.6)
Alkaline Phosphatase: 75 IU/L (ref 39–117)
BUN/Creatinine Ratio: 17 (ref 12–28)
BUN: 25 mg/dL (ref 10–36)
Bilirubin Total: 0.3 mg/dL (ref 0.0–1.2)
CO2: 25 mmol/L (ref 20–29)
Calcium: 10.1 mg/dL (ref 8.7–10.3)
Chloride: 98 mmol/L (ref 96–106)
Creatinine, Ser: 1.43 mg/dL — ABNORMAL HIGH (ref 0.57–1.00)
GFR calc Af Amer: 37 mL/min/{1.73_m2} — ABNORMAL LOW (ref >59)
GFR calc non Af Amer: 32 mL/min/{1.73_m2} — ABNORMAL LOW (ref >59)
Globulin, Total: 2.7 g/dL (ref 1.5–4.5)
Glucose: 97 mg/dL (ref 65–99)
Potassium: 5.3 mmol/L — ABNORMAL HIGH (ref 3.5–5.2)
Sodium: 140 mmol/L (ref 134–144)
Total Protein: 7.4 g/dL (ref 6.0–8.5)

## 2019-02-27 LAB — TSH: TSH: 5.24 u[IU]/mL — ABNORMAL HIGH (ref 0.450–4.500)

## 2019-03-02 ENCOUNTER — Telehealth: Payer: Self-pay

## 2019-03-02 NOTE — Telephone Encounter (Signed)
Patient advised as below. Called [facility] to add labs.

## 2019-03-02 NOTE — Telephone Encounter (Signed)
-----   Message from Trinna Post, [place] sent at 03/01/2019  2:23 PM EDT ----- Kidney function a little lower but overall stable. Make sure to stay hydrated. CBC stable. TSH a little high which can sometimes mean underactive thyroid. Will need to add follow up labs, please add Free T3 and Free T4 under dx elevated TSH.

## 2019-03-04 NOTE — Telephone Encounter (Signed)
Michelle white who is on the [place] calling regarding patient's lab results. Reports that patient didn't understand pretty well. Gave her the lab results and told her we called [facility] today to add extra labs. Sharyn Lull requested if we can call her with the results.  Thanks,  -Reilley Latorre

## 2019-03-05 LAB — T4F+T3FREE
Free T-3: 2.8 pg/mL
Free Thyroxine: 1.32 ng/dL
Thyroxine (T4): 7.5 ug/dL
Triiodothyronine (T-3), Serum: 115 ng/dL

## 2019-03-05 LAB — SPECIMEN STATUS REPORT

## 2019-03-09 ENCOUNTER — Telehealth: Payer: Self-pay

## 2019-03-09 NOTE — Telephone Encounter (Signed)
Patient advised as below.  

## 2019-03-09 NOTE — Telephone Encounter (Signed)
-----   Message from Trinna Post, [place] sent at 03/08/2019  1:58 PM EDT ----- Her follow up thyroid labs were normal, we can just observe for now.

## 2019-03-10 ENCOUNTER — Telehealth: Payer: Self-pay | Admitting: Physician Assistant

## 2019-03-10 DIAGNOSIS — Z7982 Long term (current) use of aspirin: Secondary | ICD-10-CM | POA: Diagnosis not present

## 2019-03-10 DIAGNOSIS — J449 Chronic obstructive pulmonary disease, unspecified: Secondary | ICD-10-CM | POA: Diagnosis not present

## 2019-03-10 DIAGNOSIS — R42 Dizziness and giddiness: Secondary | ICD-10-CM | POA: Diagnosis not present

## 2019-03-10 DIAGNOSIS — I480 Paroxysmal atrial fibrillation: Secondary | ICD-10-CM | POA: Diagnosis not present

## 2019-03-10 DIAGNOSIS — Z7951 Long term (current) use of inhaled steroids: Secondary | ICD-10-CM | POA: Diagnosis not present

## 2019-03-10 DIAGNOSIS — R2681 Unsteadiness on feet: Secondary | ICD-10-CM | POA: Diagnosis not present

## 2019-03-10 DIAGNOSIS — H547 Unspecified visual loss: Secondary | ICD-10-CM | POA: Diagnosis not present

## 2019-03-10 DIAGNOSIS — H3530 Unspecified macular degeneration: Secondary | ICD-10-CM | POA: Diagnosis not present

## 2019-03-10 DIAGNOSIS — T82857S Stenosis of cardiac prosthetic devices, implants and grafts, sequela: Secondary | ICD-10-CM | POA: Diagnosis not present

## 2019-03-10 DIAGNOSIS — H269 Unspecified cataract: Secondary | ICD-10-CM | POA: Diagnosis not present

## 2019-03-10 NOTE — Telephone Encounter (Signed)
OK to give verbal.

## 2019-03-10 NOTE — Telephone Encounter (Signed)
Ben PT with [facility] called asking for verbal 1 week 1 time, 2 times 3 weeks. 1 time for 2 week one visit every other week for three weeks.  Thanks [employer]

## 2019-03-11 NOTE — Telephone Encounter (Signed)
done

## 2019-03-17 DIAGNOSIS — J449 Chronic obstructive pulmonary disease, unspecified: Secondary | ICD-10-CM | POA: Diagnosis not present

## 2019-03-17 DIAGNOSIS — R2681 Unsteadiness on feet: Secondary | ICD-10-CM | POA: Diagnosis not present

## 2019-03-17 DIAGNOSIS — T82857S Stenosis of cardiac prosthetic devices, implants and grafts, sequela: Secondary | ICD-10-CM | POA: Diagnosis not present

## 2019-03-17 DIAGNOSIS — I480 Paroxysmal atrial fibrillation: Secondary | ICD-10-CM | POA: Diagnosis not present

## 2019-03-17 DIAGNOSIS — R42 Dizziness and giddiness: Secondary | ICD-10-CM | POA: Diagnosis not present

## 2019-03-17 DIAGNOSIS — H547 Unspecified visual loss: Secondary | ICD-10-CM | POA: Diagnosis not present

## 2019-03-18 DIAGNOSIS — I10 Essential (primary) hypertension: Secondary | ICD-10-CM | POA: Diagnosis not present

## 2019-03-18 DIAGNOSIS — I341 Nonrheumatic mitral (valve) prolapse: Secondary | ICD-10-CM | POA: Diagnosis not present

## 2019-03-18 DIAGNOSIS — I480 Paroxysmal atrial fibrillation: Secondary | ICD-10-CM | POA: Diagnosis not present

## 2019-03-18 DIAGNOSIS — I2510 Atherosclerotic heart disease of native coronary artery without angina pectoris: Secondary | ICD-10-CM | POA: Diagnosis not present

## 2019-03-18 DIAGNOSIS — I350 Nonrheumatic aortic (valve) stenosis: Secondary | ICD-10-CM | POA: Diagnosis not present

## 2019-03-19 DIAGNOSIS — J449 Chronic obstructive pulmonary disease, unspecified: Secondary | ICD-10-CM | POA: Diagnosis not present

## 2019-03-19 DIAGNOSIS — R2681 Unsteadiness on feet: Secondary | ICD-10-CM | POA: Diagnosis not present

## 2019-03-19 DIAGNOSIS — T82857S Stenosis of cardiac prosthetic devices, implants and grafts, sequela: Secondary | ICD-10-CM | POA: Diagnosis not present

## 2019-03-19 DIAGNOSIS — H547 Unspecified visual loss: Secondary | ICD-10-CM | POA: Diagnosis not present

## 2019-03-19 DIAGNOSIS — I480 Paroxysmal atrial fibrillation: Secondary | ICD-10-CM | POA: Diagnosis not present

## 2019-03-19 DIAGNOSIS — R42 Dizziness and giddiness: Secondary | ICD-10-CM | POA: Diagnosis not present

## 2019-03-23 DIAGNOSIS — J4520 Mild intermittent asthma, uncomplicated: Secondary | ICD-10-CM | POA: Diagnosis not present

## 2019-03-23 DIAGNOSIS — M5013 Cervical disc disorder with radiculopathy, cervicothoracic region: Secondary | ICD-10-CM | POA: Diagnosis not present

## 2019-03-23 DIAGNOSIS — I10 Essential (primary) hypertension: Secondary | ICD-10-CM | POA: Diagnosis not present

## 2019-03-23 DIAGNOSIS — I519 Heart disease, unspecified: Secondary | ICD-10-CM | POA: Diagnosis not present

## 2019-03-23 DIAGNOSIS — F064 Anxiety disorder due to known physiological condition: Secondary | ICD-10-CM | POA: Diagnosis not present

## 2019-03-23 DIAGNOSIS — H8110 Benign paroxysmal vertigo, unspecified ear: Secondary | ICD-10-CM | POA: Diagnosis not present

## 2019-03-23 DIAGNOSIS — H3530 Unspecified macular degeneration: Secondary | ICD-10-CM | POA: Diagnosis not present

## 2019-03-24 DIAGNOSIS — J449 Chronic obstructive pulmonary disease, unspecified: Secondary | ICD-10-CM | POA: Diagnosis not present

## 2019-03-24 DIAGNOSIS — H547 Unspecified visual loss: Secondary | ICD-10-CM | POA: Diagnosis not present

## 2019-03-24 DIAGNOSIS — T82857S Stenosis of cardiac prosthetic devices, implants and grafts, sequela: Secondary | ICD-10-CM | POA: Diagnosis not present

## 2019-03-24 DIAGNOSIS — I480 Paroxysmal atrial fibrillation: Secondary | ICD-10-CM | POA: Diagnosis not present

## 2019-03-24 DIAGNOSIS — R2681 Unsteadiness on feet: Secondary | ICD-10-CM | POA: Diagnosis not present

## 2019-03-24 DIAGNOSIS — R42 Dizziness and giddiness: Secondary | ICD-10-CM | POA: Diagnosis not present

## 2019-03-25 DIAGNOSIS — I480 Paroxysmal atrial fibrillation: Secondary | ICD-10-CM | POA: Diagnosis not present

## 2019-03-25 DIAGNOSIS — J449 Chronic obstructive pulmonary disease, unspecified: Secondary | ICD-10-CM | POA: Diagnosis not present

## 2019-03-25 DIAGNOSIS — T82857S Stenosis of cardiac prosthetic devices, implants and grafts, sequela: Secondary | ICD-10-CM | POA: Diagnosis not present

## 2019-03-25 DIAGNOSIS — R42 Dizziness and giddiness: Secondary | ICD-10-CM | POA: Diagnosis not present

## 2019-03-25 DIAGNOSIS — R2681 Unsteadiness on feet: Secondary | ICD-10-CM | POA: Diagnosis not present

## 2019-03-25 DIAGNOSIS — H547 Unspecified visual loss: Secondary | ICD-10-CM | POA: Diagnosis not present

## 2019-03-30 DIAGNOSIS — R2681 Unsteadiness on feet: Secondary | ICD-10-CM | POA: Diagnosis not present

## 2019-03-30 DIAGNOSIS — H547 Unspecified visual loss: Secondary | ICD-10-CM | POA: Diagnosis not present

## 2019-03-30 DIAGNOSIS — T82857S Stenosis of cardiac prosthetic devices, implants and grafts, sequela: Secondary | ICD-10-CM | POA: Diagnosis not present

## 2019-03-30 DIAGNOSIS — J449 Chronic obstructive pulmonary disease, unspecified: Secondary | ICD-10-CM | POA: Diagnosis not present

## 2019-03-30 DIAGNOSIS — R42 Dizziness and giddiness: Secondary | ICD-10-CM | POA: Diagnosis not present

## 2019-03-30 DIAGNOSIS — I480 Paroxysmal atrial fibrillation: Secondary | ICD-10-CM | POA: Diagnosis not present

## 2019-04-02 DIAGNOSIS — T82857S Stenosis of cardiac prosthetic devices, implants and grafts, sequela: Secondary | ICD-10-CM | POA: Diagnosis not present

## 2019-04-02 DIAGNOSIS — J449 Chronic obstructive pulmonary disease, unspecified: Secondary | ICD-10-CM | POA: Diagnosis not present

## 2019-04-02 DIAGNOSIS — R2681 Unsteadiness on feet: Secondary | ICD-10-CM | POA: Diagnosis not present

## 2019-04-02 DIAGNOSIS — I480 Paroxysmal atrial fibrillation: Secondary | ICD-10-CM | POA: Diagnosis not present

## 2019-04-02 DIAGNOSIS — R42 Dizziness and giddiness: Secondary | ICD-10-CM | POA: Diagnosis not present

## 2019-04-02 DIAGNOSIS — H547 Unspecified visual loss: Secondary | ICD-10-CM | POA: Diagnosis not present

## 2019-04-05 DIAGNOSIS — R2681 Unsteadiness on feet: Secondary | ICD-10-CM | POA: Diagnosis not present

## 2019-04-05 DIAGNOSIS — R42 Dizziness and giddiness: Secondary | ICD-10-CM | POA: Diagnosis not present

## 2019-04-05 DIAGNOSIS — T82857S Stenosis of cardiac prosthetic devices, implants and grafts, sequela: Secondary | ICD-10-CM | POA: Diagnosis not present

## 2019-04-05 DIAGNOSIS — I480 Paroxysmal atrial fibrillation: Secondary | ICD-10-CM | POA: Diagnosis not present

## 2019-04-05 DIAGNOSIS — H547 Unspecified visual loss: Secondary | ICD-10-CM | POA: Diagnosis not present

## 2019-04-05 DIAGNOSIS — J449 Chronic obstructive pulmonary disease, unspecified: Secondary | ICD-10-CM | POA: Diagnosis not present

## 2019-04-09 DIAGNOSIS — H547 Unspecified visual loss: Secondary | ICD-10-CM | POA: Diagnosis not present

## 2019-04-09 DIAGNOSIS — J449 Chronic obstructive pulmonary disease, unspecified: Secondary | ICD-10-CM | POA: Diagnosis not present

## 2019-04-09 DIAGNOSIS — I480 Paroxysmal atrial fibrillation: Secondary | ICD-10-CM | POA: Diagnosis not present

## 2019-04-09 DIAGNOSIS — R42 Dizziness and giddiness: Secondary | ICD-10-CM | POA: Diagnosis not present

## 2019-04-09 DIAGNOSIS — Z7982 Long term (current) use of aspirin: Secondary | ICD-10-CM | POA: Diagnosis not present

## 2019-04-09 DIAGNOSIS — Z7951 Long term (current) use of inhaled steroids: Secondary | ICD-10-CM | POA: Diagnosis not present

## 2019-04-09 DIAGNOSIS — T82857S Stenosis of cardiac prosthetic devices, implants and grafts, sequela: Secondary | ICD-10-CM | POA: Diagnosis not present

## 2019-04-09 DIAGNOSIS — R2681 Unsteadiness on feet: Secondary | ICD-10-CM | POA: Diagnosis not present

## 2019-04-09 DIAGNOSIS — H3530 Unspecified macular degeneration: Secondary | ICD-10-CM | POA: Diagnosis not present

## 2019-04-09 DIAGNOSIS — H269 Unspecified cataract: Secondary | ICD-10-CM | POA: Diagnosis not present

## 2019-04-13 DIAGNOSIS — R2681 Unsteadiness on feet: Secondary | ICD-10-CM | POA: Diagnosis not present

## 2019-04-13 DIAGNOSIS — R42 Dizziness and giddiness: Secondary | ICD-10-CM | POA: Diagnosis not present

## 2019-04-13 DIAGNOSIS — T82857S Stenosis of cardiac prosthetic devices, implants and grafts, sequela: Secondary | ICD-10-CM | POA: Diagnosis not present

## 2019-04-13 DIAGNOSIS — H547 Unspecified visual loss: Secondary | ICD-10-CM | POA: Diagnosis not present

## 2019-04-13 DIAGNOSIS — I480 Paroxysmal atrial fibrillation: Secondary | ICD-10-CM | POA: Diagnosis not present

## 2019-04-13 DIAGNOSIS — J449 Chronic obstructive pulmonary disease, unspecified: Secondary | ICD-10-CM | POA: Diagnosis not present

## 2019-04-16 ENCOUNTER — Ambulatory Visit: Payer: Self-pay | Admitting: Physician Assistant

## 2019-04-20 DIAGNOSIS — T82857S Stenosis of cardiac prosthetic devices, implants and grafts, sequela: Secondary | ICD-10-CM | POA: Diagnosis not present

## 2019-04-20 DIAGNOSIS — R2681 Unsteadiness on feet: Secondary | ICD-10-CM | POA: Diagnosis not present

## 2019-04-20 DIAGNOSIS — I480 Paroxysmal atrial fibrillation: Secondary | ICD-10-CM | POA: Diagnosis not present

## 2019-04-20 DIAGNOSIS — R42 Dizziness and giddiness: Secondary | ICD-10-CM | POA: Diagnosis not present

## 2019-04-20 DIAGNOSIS — H547 Unspecified visual loss: Secondary | ICD-10-CM | POA: Diagnosis not present

## 2019-04-20 DIAGNOSIS — J449 Chronic obstructive pulmonary disease, unspecified: Secondary | ICD-10-CM | POA: Diagnosis not present

## 2019-04-22 ENCOUNTER — Other Ambulatory Visit: Payer: Self-pay

## 2019-05-05 DIAGNOSIS — T82857S Stenosis of cardiac prosthetic devices, implants and grafts, sequela: Secondary | ICD-10-CM | POA: Diagnosis not present

## 2019-05-05 DIAGNOSIS — R2681 Unsteadiness on feet: Secondary | ICD-10-CM | POA: Diagnosis not present

## 2019-05-05 DIAGNOSIS — H547 Unspecified visual loss: Secondary | ICD-10-CM | POA: Diagnosis not present

## 2019-05-05 DIAGNOSIS — R42 Dizziness and giddiness: Secondary | ICD-10-CM | POA: Diagnosis not present

## 2019-05-05 DIAGNOSIS — J449 Chronic obstructive pulmonary disease, unspecified: Secondary | ICD-10-CM | POA: Diagnosis not present

## 2019-05-05 DIAGNOSIS — I480 Paroxysmal atrial fibrillation: Secondary | ICD-10-CM | POA: Diagnosis not present

## 2019-05-13 ENCOUNTER — Other Ambulatory Visit: Payer: Self-pay | Admitting: Physician Assistant

## 2019-05-13 DIAGNOSIS — F3289 Other specified depressive episodes: Secondary | ICD-10-CM

## 2019-05-13 DIAGNOSIS — G4700 Insomnia, unspecified: Secondary | ICD-10-CM

## 2019-05-13 DIAGNOSIS — E782 Mixed hyperlipidemia: Secondary | ICD-10-CM

## 2019-05-13 DIAGNOSIS — I10 Essential (primary) hypertension: Secondary | ICD-10-CM

## 2019-05-13 NOTE — Telephone Encounter (Signed)
He has established with you since [place] left.

## 2019-06-21 DIAGNOSIS — Z23 Encounter for immunization: Secondary | ICD-10-CM | POA: Diagnosis not present

## 2019-07-01 ENCOUNTER — Other Ambulatory Visit: Payer: Self-pay | Admitting: Physician Assistant

## 2019-07-01 DIAGNOSIS — R441 Visual hallucinations: Secondary | ICD-10-CM

## 2019-07-01 DIAGNOSIS — G4700 Insomnia, unspecified: Secondary | ICD-10-CM

## 2019-07-19 ENCOUNTER — Emergency Department: Payer: Medicare Other

## 2019-07-19 ENCOUNTER — Encounter: Payer: Self-pay | Admitting: Emergency Medicine

## 2019-07-19 ENCOUNTER — Inpatient Hospital Stay
Admission: EM | Admit: 2019-07-19 | Discharge: 2019-07-21 | DRG: 312 | Disposition: A | Discharge status: Home Health | Payer: Medicare Other | Attending: Internal Medicine | Admitting: Internal Medicine

## 2019-07-19 ENCOUNTER — Other Ambulatory Visit: Payer: Self-pay

## 2019-07-19 DIAGNOSIS — S0083XA Contusion of other part of head, initial encounter: Secondary | ICD-10-CM | POA: Diagnosis present

## 2019-07-19 DIAGNOSIS — R55 Syncope and collapse: Secondary | ICD-10-CM | POA: Diagnosis not present

## 2019-07-19 DIAGNOSIS — F419 Anxiety disorder, unspecified: Secondary | ICD-10-CM | POA: Diagnosis present

## 2019-07-19 DIAGNOSIS — S00511A Abrasion of lip, initial encounter: Secondary | ICD-10-CM | POA: Diagnosis present

## 2019-07-19 DIAGNOSIS — Z20828 Contact with and (suspected) exposure to other viral communicable diseases: Secondary | ICD-10-CM | POA: Diagnosis present

## 2019-07-19 DIAGNOSIS — E860 Dehydration: Secondary | ICD-10-CM | POA: Diagnosis present

## 2019-07-19 DIAGNOSIS — T82858A Stenosis of vascular prosthetic devices, implants and grafts, initial encounter: Secondary | ICD-10-CM | POA: Diagnosis not present

## 2019-07-19 DIAGNOSIS — W1830XA Fall on same level, unspecified, initial encounter: Secondary | ICD-10-CM | POA: Diagnosis present

## 2019-07-19 DIAGNOSIS — Z79891 Long term (current) use of opiate analgesic: Secondary | ICD-10-CM

## 2019-07-19 DIAGNOSIS — N179 Acute kidney failure, unspecified: Secondary | ICD-10-CM | POA: Diagnosis not present

## 2019-07-19 DIAGNOSIS — S42341A Displaced spiral fracture of shaft of humerus, right arm, initial encounter for closed fracture: Secondary | ICD-10-CM

## 2019-07-19 DIAGNOSIS — E785 Hyperlipidemia, unspecified: Secondary | ICD-10-CM | POA: Diagnosis not present

## 2019-07-19 DIAGNOSIS — I160 Hypertensive urgency: Secondary | ICD-10-CM | POA: Diagnosis present

## 2019-07-19 DIAGNOSIS — I129 Hypertensive chronic kidney disease with stage 1 through stage 4 chronic kidney disease, or unspecified chronic kidney disease: Secondary | ICD-10-CM | POA: Diagnosis not present

## 2019-07-19 DIAGNOSIS — Z882 Allergy status to sulfonamides status: Secondary | ICD-10-CM

## 2019-07-19 DIAGNOSIS — Z7951 Long term (current) use of inhaled steroids: Secondary | ICD-10-CM

## 2019-07-19 DIAGNOSIS — J449 Chronic obstructive pulmonary disease, unspecified: Secondary | ICD-10-CM | POA: Diagnosis present

## 2019-07-19 DIAGNOSIS — R296 Repeated falls: Secondary | ICD-10-CM | POA: Diagnosis not present

## 2019-07-19 DIAGNOSIS — S42301A Unspecified fracture of shaft of humerus, right arm, initial encounter for closed fracture: Secondary | ICD-10-CM | POA: Diagnosis not present

## 2019-07-19 DIAGNOSIS — D631 Anemia in chronic kidney disease: Secondary | ICD-10-CM | POA: Diagnosis present

## 2019-07-19 DIAGNOSIS — Z7982 Long term (current) use of aspirin: Secondary | ICD-10-CM

## 2019-07-19 DIAGNOSIS — F329 Major depressive disorder, single episode, unspecified: Secondary | ICD-10-CM | POA: Diagnosis present

## 2019-07-19 DIAGNOSIS — M109 Gout, unspecified: Secondary | ICD-10-CM | POA: Diagnosis present

## 2019-07-19 DIAGNOSIS — Y838 Other surgical procedures as the cause of abnormal reaction of the patient, or of later complication, without mention of misadventure at the time of the procedure: Secondary | ICD-10-CM | POA: Diagnosis present

## 2019-07-19 DIAGNOSIS — I480 Paroxysmal atrial fibrillation: Secondary | ICD-10-CM | POA: Diagnosis present

## 2019-07-19 DIAGNOSIS — T148XXA Other injury of unspecified body region, initial encounter: Secondary | ICD-10-CM

## 2019-07-19 DIAGNOSIS — Z953 Presence of xenogenic heart valve: Secondary | ICD-10-CM

## 2019-07-19 DIAGNOSIS — Z888 Allergy status to other drugs, medicaments and biological substances status: Secondary | ICD-10-CM

## 2019-07-19 DIAGNOSIS — Z881 Allergy status to other antibiotic agents status: Secondary | ICD-10-CM

## 2019-07-19 DIAGNOSIS — S42309A Unspecified fracture of shaft of humerus, unspecified arm, initial encounter for closed fracture: Secondary | ICD-10-CM

## 2019-07-19 DIAGNOSIS — N183 Chronic kidney disease, stage 3 unspecified: Secondary | ICD-10-CM | POA: Diagnosis present

## 2019-07-19 DIAGNOSIS — S0031XA Abrasion of nose, initial encounter: Secondary | ICD-10-CM | POA: Diagnosis present

## 2019-07-19 DIAGNOSIS — R946 Abnormal results of thyroid function studies: Secondary | ICD-10-CM | POA: Diagnosis present

## 2019-07-19 DIAGNOSIS — S299XXA Unspecified injury of thorax, initial encounter: Secondary | ICD-10-CM | POA: Diagnosis not present

## 2019-07-19 DIAGNOSIS — I2510 Atherosclerotic heart disease of native coronary artery without angina pectoris: Secondary | ICD-10-CM | POA: Diagnosis present

## 2019-07-19 DIAGNOSIS — Z79899 Other long term (current) drug therapy: Secondary | ICD-10-CM

## 2019-07-19 DIAGNOSIS — Z03818 Encounter for observation for suspected exposure to other biological agents ruled out: Secondary | ICD-10-CM | POA: Diagnosis not present

## 2019-07-19 DIAGNOSIS — Z91013 Allergy to seafood: Secondary | ICD-10-CM

## 2019-07-19 DIAGNOSIS — Z8249 Family history of ischemic heart disease and other diseases of the circulatory system: Secondary | ICD-10-CM

## 2019-07-19 DIAGNOSIS — M810 Age-related osteoporosis without current pathological fracture: Secondary | ICD-10-CM | POA: Diagnosis present

## 2019-07-19 DIAGNOSIS — Z96611 Presence of right artificial shoulder joint: Secondary | ICD-10-CM | POA: Diagnosis present

## 2019-07-19 LAB — CBC
HCT: 35.4 % — ABNORMAL LOW (ref 36.0–46.0)
Hemoglobin: 11.5 g/dL — ABNORMAL LOW (ref 12.0–15.0)
MCH: 30.9 pg (ref 26.0–34.0)
MCHC: 32.5 g/dL (ref 30.0–36.0)
MCV: 95.2 fL (ref 80.0–100.0)
Platelets: 184 10*3/uL (ref 150–400)
RBC: 3.72 MIL/uL — ABNORMAL LOW (ref 3.87–5.11)
RDW: 12.8 % (ref 11.5–15.5)
WBC: 10.8 10*3/uL — ABNORMAL HIGH (ref 4.0–10.5)
nRBC: 0 % (ref 0.0–0.2)

## 2019-07-19 LAB — BASIC METABOLIC PANEL
Anion gap: 11 (ref 5–15)
BUN: 31 mg/dL — ABNORMAL HIGH (ref 8–23)
CO2: 27 mmol/L (ref 22–32)
Calcium: 9.3 mg/dL (ref 8.9–10.3)
Chloride: 101 mmol/L (ref 98–111)
Creatinine, Ser: 1.26 mg/dL — ABNORMAL HIGH (ref 0.44–1.00)
GFR calc Af Amer: 43 mL/min — ABNORMAL LOW (ref >60)
GFR calc non Af Amer: 37 mL/min — ABNORMAL LOW (ref >60)
Glucose, Bld: 160 mg/dL — ABNORMAL HIGH (ref 70–99)
Potassium: 4.1 mmol/L (ref 3.5–5.1)
Sodium: 139 mmol/L (ref 135–145)

## 2019-07-19 LAB — TROPONIN I (HIGH SENSITIVITY): Troponin I (High Sensitivity): 16 ng/L (ref <18)

## 2019-07-19 IMAGING — DX DG CHEST 1V PORT
1 series · 1 of 1 positions shown · non-contrast
Comparison: Chest radiograph dated [DATE]

CLINICAL DATA: [AGE] female with fall and syncope.

EXAM:
PORTABLE CHEST 1 VIEW

[chest ap]
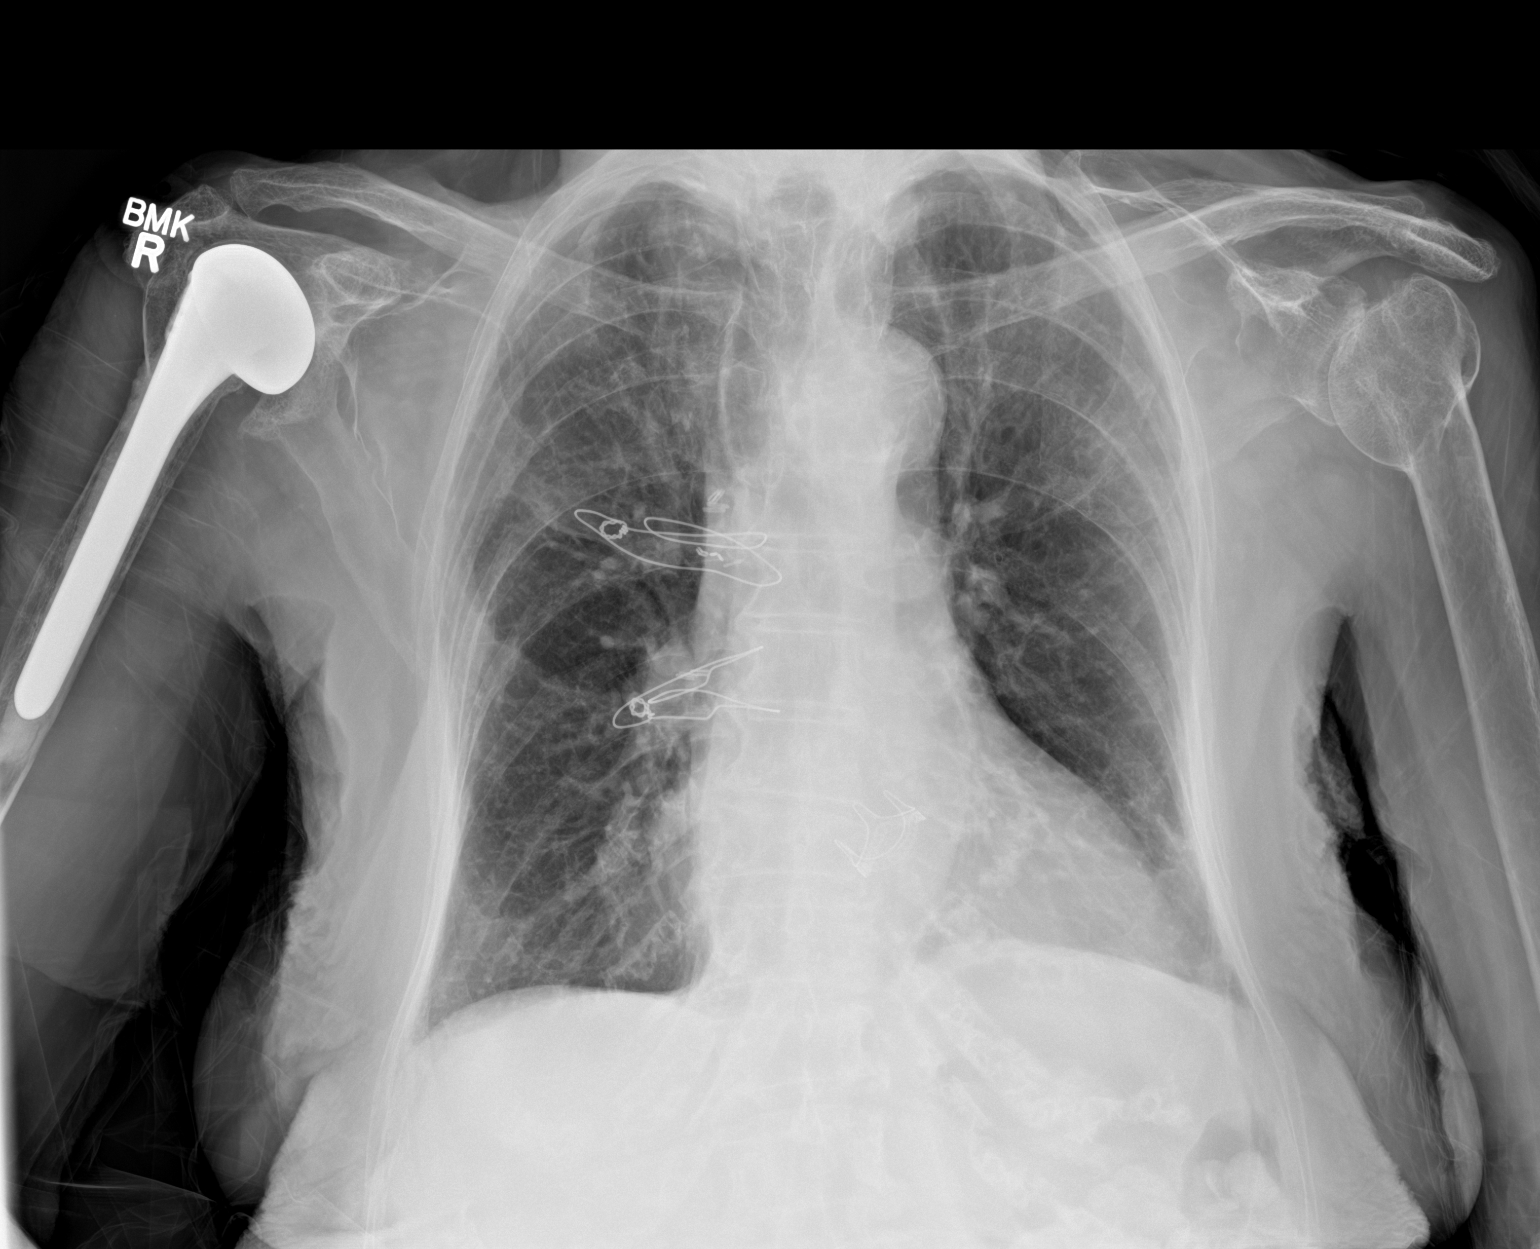

[1 of 1 positions shown; findings below may reference images not displayed]

FINDINGS: Emphysematous changes of the lungs. No focal consolidation, pleural
effusion, or pneumothorax. Diffuse interstitial prominence, likely
chronic. Mild cardiomegaly. Median sternotomy wires and mechanical
cardiac valve. Atherosclerotic calcification of the aorta. No acute
osseous pathology. Osteopenia with degenerative changes of the spine
and shoulders.
IMPRESSION: 1. No acute cardiopulmonary process.
2. Emphysema.
3. Mild cardiomegaly.

## 2019-07-19 IMAGING — CT CT HEAD W/O CM
3 series · 15 of 47 positions shown, 18 images · non-contrast
Comparison: [DATE]

CLINICAL DATA: Recent falls

EXAM:
CT HEAD WITHOUT CONTRAST
TECHNIQUE: Contiguous axial images were obtained from the base of the skull
through the vertex without intravenous contrast.

[Series 2: head wo · axial · 0.47mm/px · z∈[-106,+19]mm · 9 of 30 slices shown, 12 images]
[im 3/30  brain]
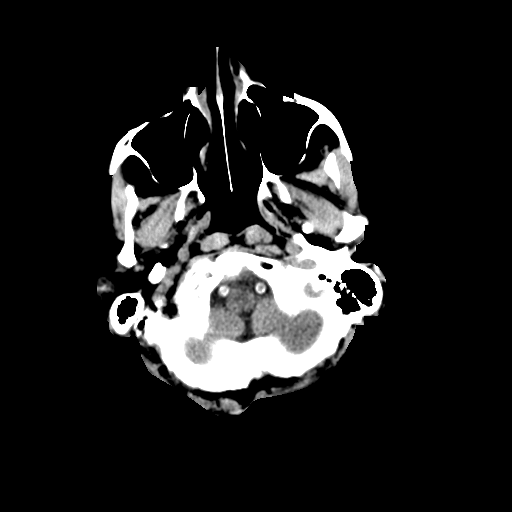
[im 3/30  bone]
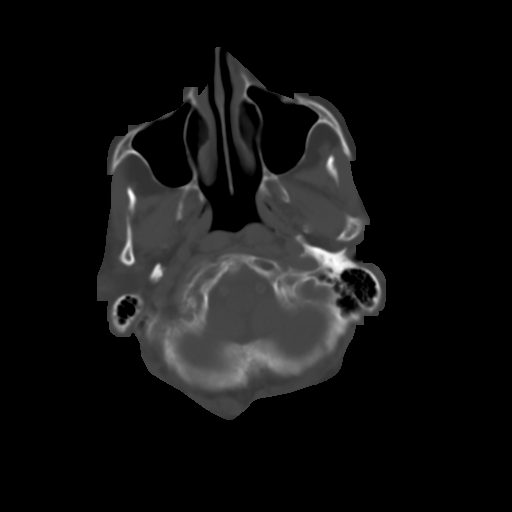
[im 6/30  brain]
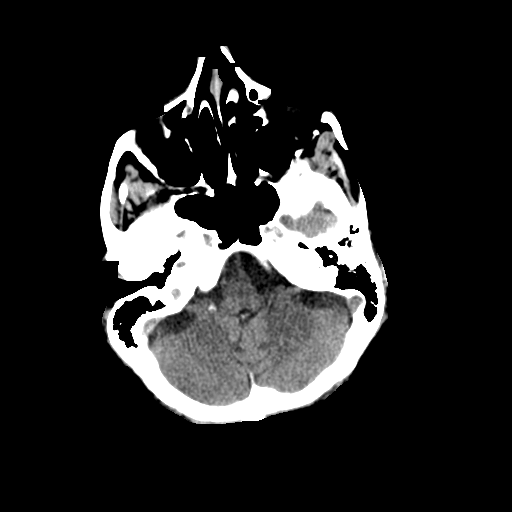
[im 9/30  brain]
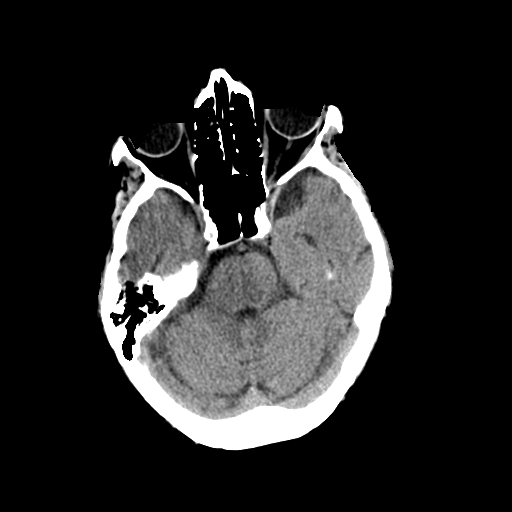
[im 12/30  brain]
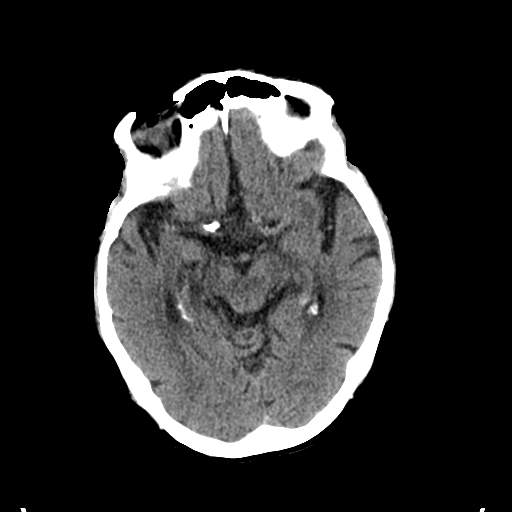
[im 16/30  brain]
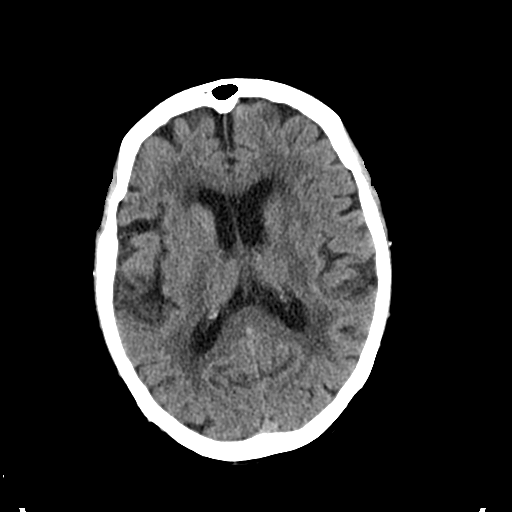
[im 16/30  bone]
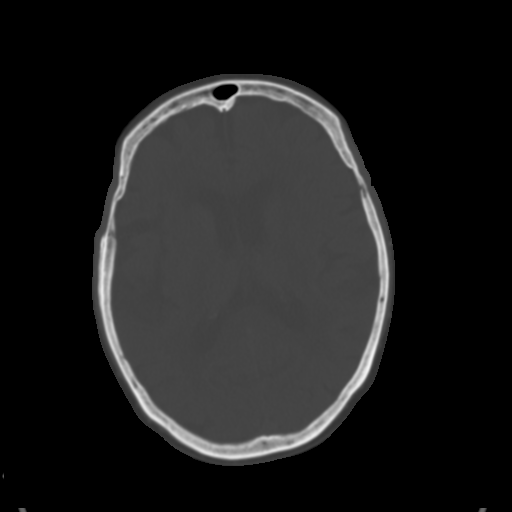
[im 19/30  brain]
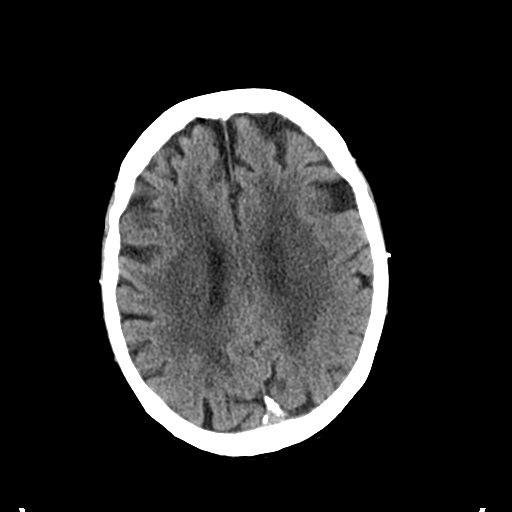
[im 22/30  brain]
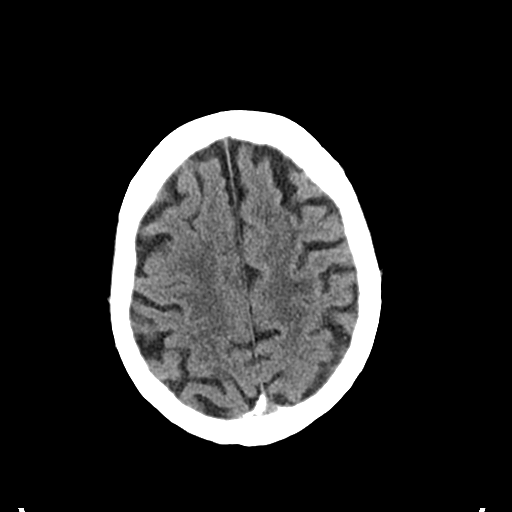
[im 25/30  brain]
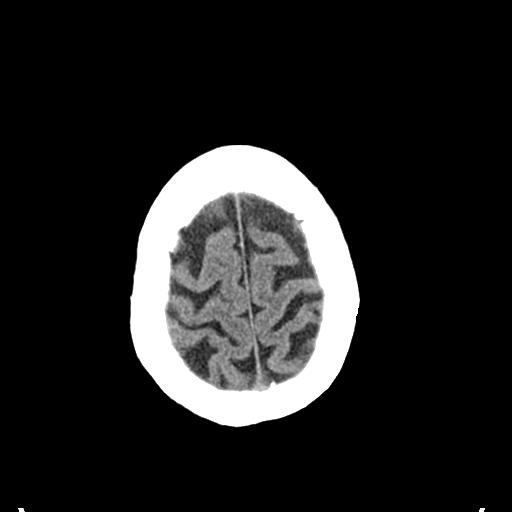
[im 28/30  brain]
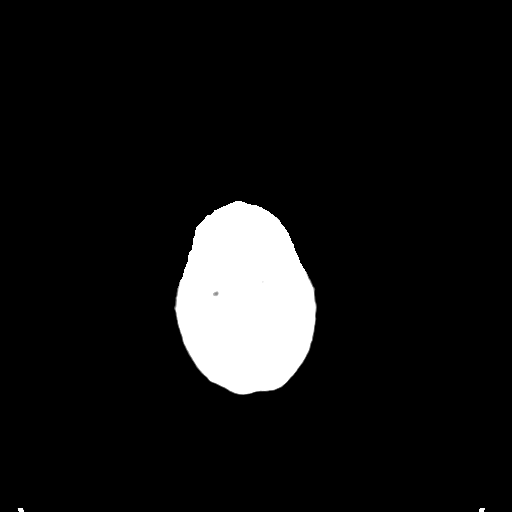
[im 28/30  bone]
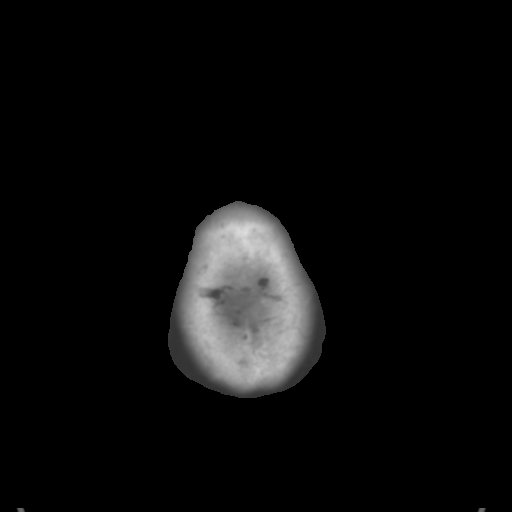

[Series 4: coronal soft tissue · coronal · 0.29mm/px · 3 of 67 slices shown]
[im 23/67  brain]
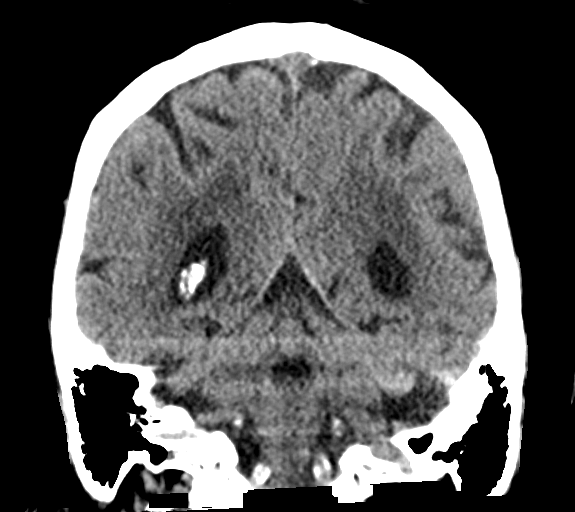
[im 30/67  brain]
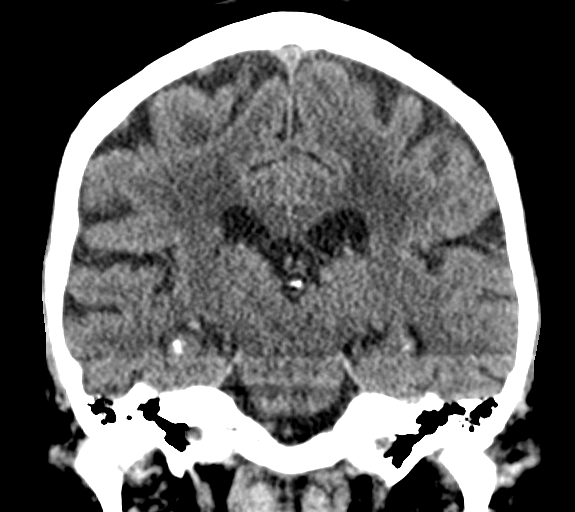
[im 37/67  brain]
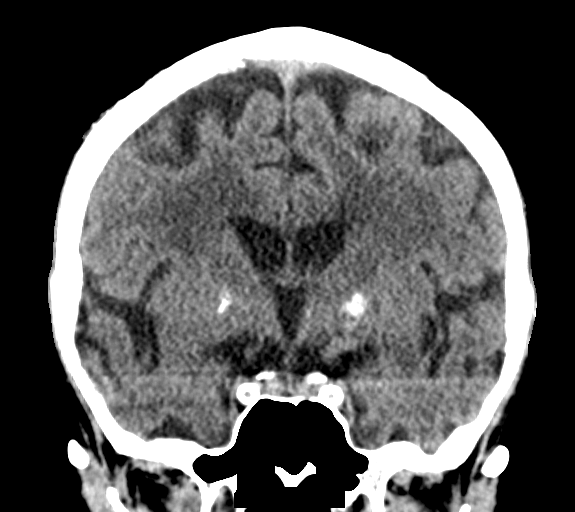

[Series 5: sagittal soft tissue · sagittal · 0.29mm/px · 3 of 53 slices shown]
[im 18/53  brain]
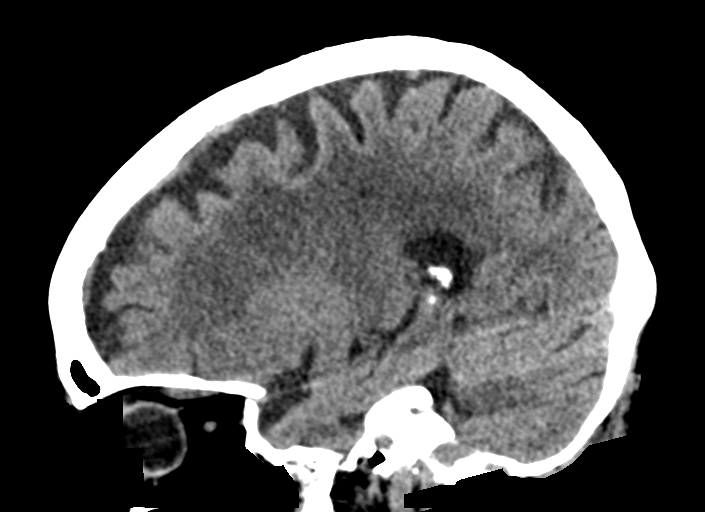
[im 27/53  brain]
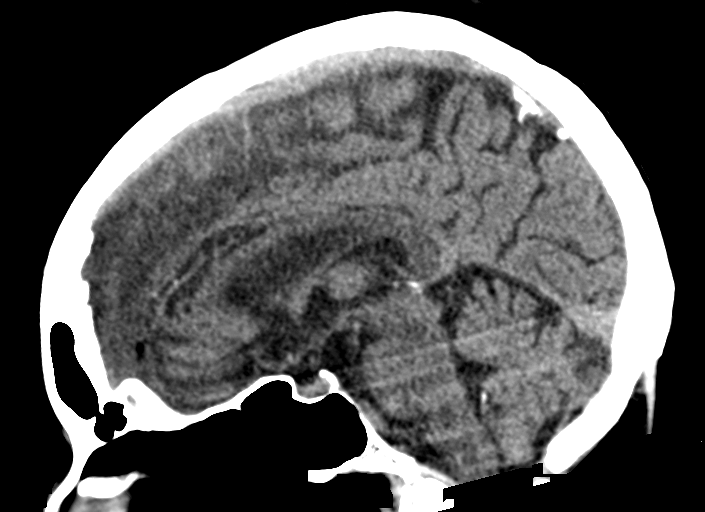
[im 35/53  brain]
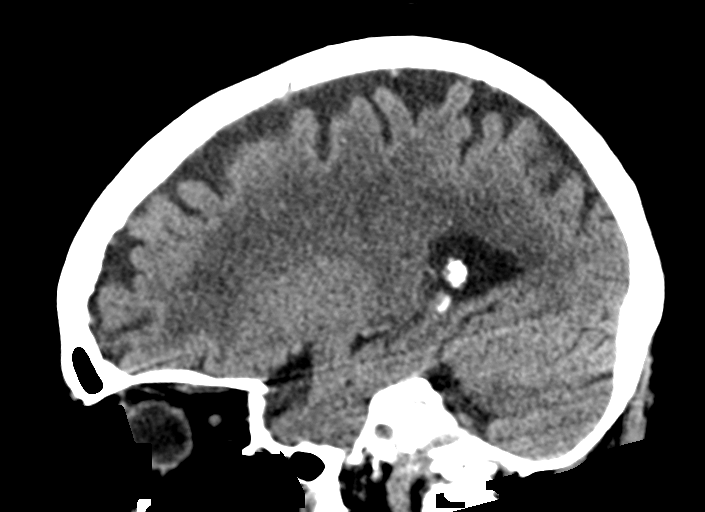

[15 of 47 positions shown; findings below may reference images not displayed]

FINDINGS: Brain: Chronic atrophic and ischemic changes are noted similar to
that seen on the prior exam. Bilateral basal ganglia calcifications
are seen. No findings to suggest acute hemorrhage, acute infarction
or space-occupying mass lesion noted.

Vascular: No hyperdense vessel or unexpected calcification.

Skull: Normal. Negative for fracture or focal lesion.

Sinuses/Orbits: No acute finding.

Other: None.
IMPRESSION: Chronic atrophic and ischemic changes without acute abnormality.

## 2019-07-19 IMAGING — CR DG SHOULDER 2+V*R*
2 series · 2 of 2 positions shown · non-contrast
Comparison: None.

CLINICAL DATA: Recent fall with right arm pain, initial encounter

EXAM:
RIGHT SHOULDER - 2+ VIEW

[shoulder y view (1 of 2)]
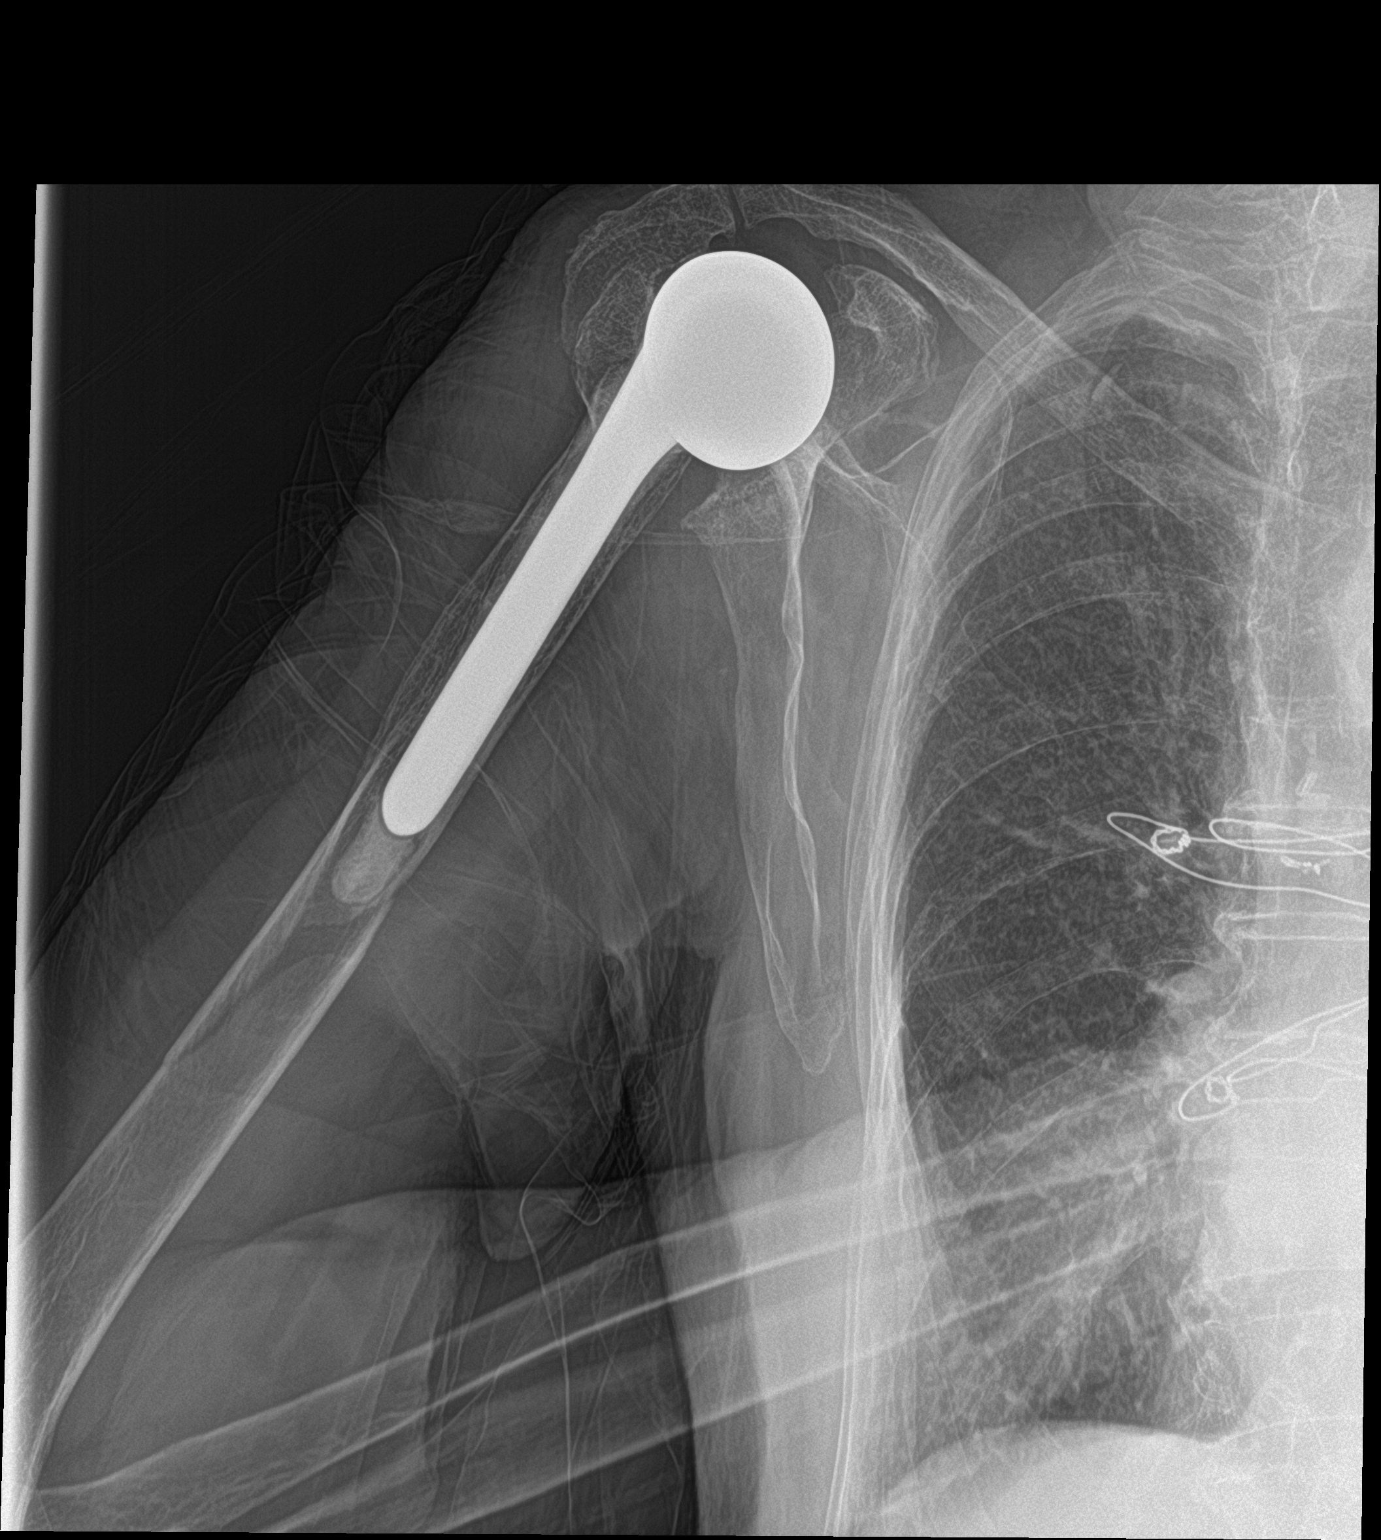

[shoulder y view (2 of 2)]
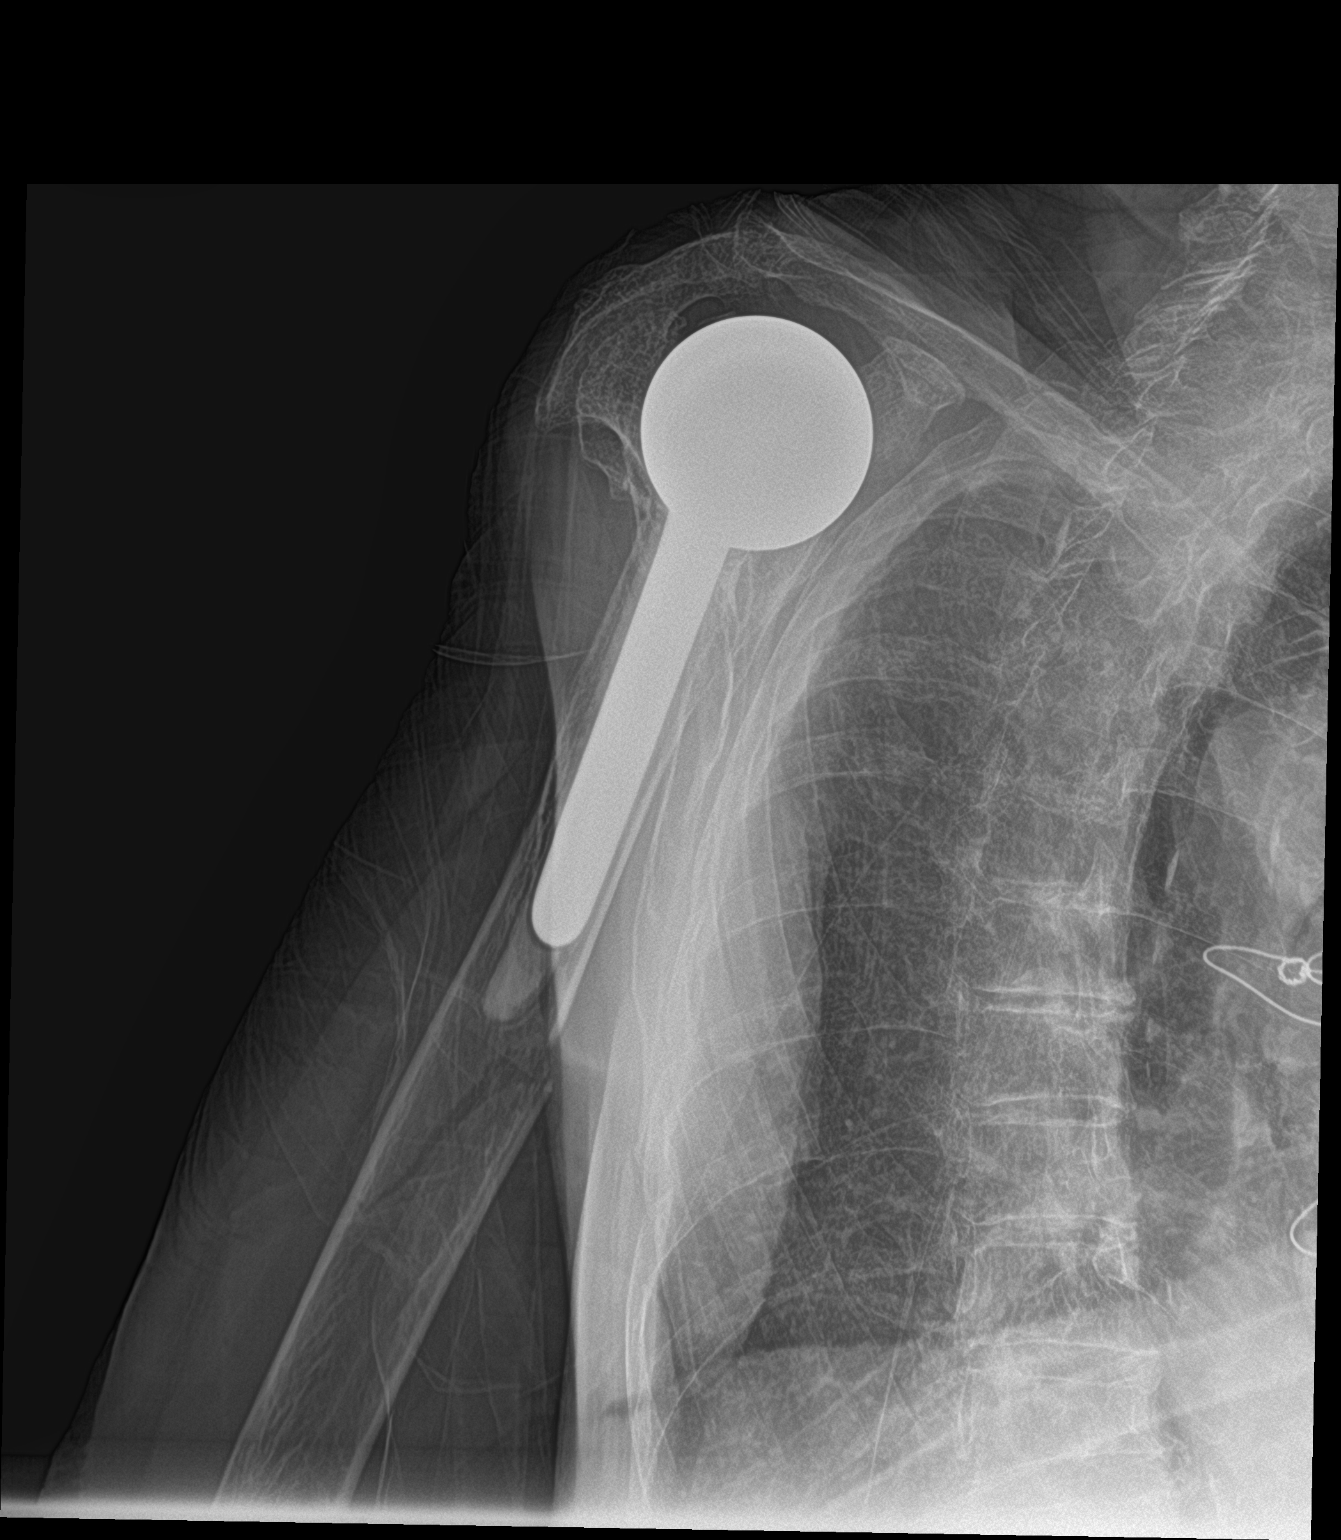

[2 of 2 positions shown; findings below may reference images not displayed]

FINDINGS: Right shoulder prosthesis is noted. There is a spiral fracture
identified just distal to the tip of the prosthesis with mild
displacement identified. No definitive periprosthetic fracture is
seen.
IMPRESSION: Spiral fracture in the midshaft of the right humerus just below the
known shoulder prosthesis

## 2019-07-19 MED ORDER — ACETAMINOPHEN 650 MG RE SUPP: 650.0000 mg | Freq: Four times a day (QID) | RECTAL | Status: DC | PRN

## 2019-07-19 MED ORDER — ONDANSETRON HCL 4 MG/2ML IJ SOLN: 4.0000 mg | Freq: Four times a day (QID) | INTRAMUSCULAR | Status: DC | PRN

## 2019-07-19 MED ORDER — BACITRACIN ZINC 500 UNIT/GM EX OINT
TOPICAL_OINTMENT | Freq: Once | CUTANEOUS | Status: AC
  Administered 2019-07-19: 1 via TOPICAL
  Filled 2019-07-19: qty 1.8

## 2019-07-19 MED ORDER — SIMVASTATIN 20 MG PO TABS
20.0000 mg | ORAL_TABLET | Freq: Every day | ORAL | Status: DC
  Administered 2019-07-20: 18:00:00 20 mg via ORAL
  Filled 2019-07-19: qty 1

## 2019-07-19 MED ORDER — ENOXAPARIN SODIUM 30 MG/0.3ML ~~LOC~~ SOLN
30.0000 mg | SUBCUTANEOUS | Status: DC
  Administered 2019-07-20 – 2019-07-21 (×2): 30 mg via SUBCUTANEOUS
  Filled 2019-07-19 (×2): qty 0.3

## 2019-07-19 MED ORDER — FUROSEMIDE 20 MG PO TABS
20.0000 mg | ORAL_TABLET | Freq: Every day | ORAL | Status: DC
  Administered 2019-07-20 – 2019-07-21 (×2): 20 mg via ORAL
  Filled 2019-07-19 (×2): qty 1

## 2019-07-19 MED ORDER — MAGNESIUM HYDROXIDE 400 MG/5ML PO SUSP: 30.0000 mL | Freq: Every day | ORAL | Status: DC | PRN

## 2019-07-19 MED ORDER — ALLOPURINOL 100 MG PO TABS
100.0000 mg | ORAL_TABLET | Freq: Every day | ORAL | Status: DC
  Administered 2019-07-20 – 2019-07-21 (×2): 100 mg via ORAL
  Filled 2019-07-19 (×2): qty 1

## 2019-07-19 MED ORDER — ASPIRIN EC 81 MG PO TBEC
81.0000 mg | DELAYED_RELEASE_TABLET | Freq: Every day | ORAL | Status: DC
  Administered 2019-07-20 – 2019-07-21 (×2): 81 mg via ORAL
  Filled 2019-07-19 (×2): qty 1

## 2019-07-19 MED ORDER — SODIUM CHLORIDE 0.9 % IV SOLN
INTRAVENOUS | Status: DC
  Administered 2019-07-20: 03:00:00 via INTRAVENOUS

## 2019-07-19 MED ORDER — ALBUTEROL SULFATE (2.5 MG/3ML) 0.083% IN NEBU: 2.5000 mg | INHALATION_SOLUTION | Freq: Four times a day (QID) | RESPIRATORY_TRACT | Status: DC | PRN

## 2019-07-19 MED ORDER — IPRATROPIUM-ALBUTEROL 0.5-2.5 (3) MG/3ML IN SOLN
3.0000 mL | Freq: Three times a day (TID) | RESPIRATORY_TRACT | Status: DC
  Administered 2019-07-20: 3 mL via RESPIRATORY_TRACT

## 2019-07-19 MED ORDER — METOPROLOL SUCCINATE ER 50 MG PO TB24
50.0000 mg | ORAL_TABLET | Freq: Every day | ORAL | Status: DC
  Administered 2019-07-20 – 2019-07-21 (×2): 50 mg via ORAL
  Filled 2019-07-19 (×3): qty 1

## 2019-07-19 MED ORDER — TRAZODONE HCL 50 MG PO TABS: 25.0000 mg | ORAL_TABLET | Freq: Every day | ORAL | Status: DC

## 2019-07-19 MED ORDER — ACETAMINOPHEN 325 MG PO TABS: 650.0000 mg | ORAL_TABLET | Freq: Four times a day (QID) | ORAL | Status: DC | PRN

## 2019-07-19 MED ORDER — TRAZODONE HCL 50 MG PO TABS: 25.0000 mg | ORAL_TABLET | Freq: Every evening | ORAL | Status: DC | PRN

## 2019-07-19 MED ORDER — FLUTICASONE PROPIONATE 50 MCG/ACT NA SUSP
2.0000 | Freq: Every day | NASAL | Status: DC
  Administered 2019-07-20 – 2019-07-21 (×2): 2 via NASAL
  Filled 2019-07-19: qty 16

## 2019-07-19 MED ORDER — FENTANYL CITRATE (PF) 100 MCG/2ML IJ SOLN
25.0000 ug | INTRAMUSCULAR | Status: DC | PRN
  Administered 2019-07-19: 25 ug via INTRAVENOUS
  Filled 2019-07-19: qty 2

## 2019-07-19 MED ORDER — IPRATROPIUM-ALBUTEROL 0.5-2.5 (3) MG/3ML IN SOLN
3.0000 mL | Freq: Once | RESPIRATORY_TRACT | Status: AC
  Administered 2019-07-19: 3 mL via RESPIRATORY_TRACT
  Filled 2019-07-19: qty 3

## 2019-07-19 MED ORDER — ESCITALOPRAM OXALATE 10 MG PO TABS
20.0000 mg | ORAL_TABLET | Freq: Every day | ORAL | Status: DC
  Administered 2019-07-20 – 2019-07-21 (×2): 20 mg via ORAL
  Filled 2019-07-19 (×2): qty 2

## 2019-07-19 MED ORDER — ONDANSETRON HCL 4 MG PO TABS: 4.0000 mg | ORAL_TABLET | Freq: Four times a day (QID) | ORAL | Status: DC | PRN

## 2019-07-19 NOTE — ED Provider Notes (Addendum)
[facility] Emergency Department Provider Note    First MD Initiated Contact with Patient 07/19/19 2203     (approximate)  I have reviewed the triage vital signs and the nursing notes.   HISTORY  Chief Complaint Fall    HPI Marilyn Oconnell is a 85 y.o. female extensive history as listed below presents the ER after fall with right arm pain.  Did fall and hit her head.  No LOC but patient somewhat amnestic to the event.   Recorded patient's been having more frequent episodes where she is falling is concerned that she is having fainting episodes.  On review of medical record does appear that she has a history of aortic stenosis.  Has been seen by ENT and was told that it is "not inner ear."  She denies any chest pain.  No numbness or tingling.  Does have right arm pain   Past Medical History:  Diagnosis Date  . Arthritis   . CHF (congestive heart failure) ([place])   . COPD (chronic obstructive pulmonary disease) ([place])   . Coronary artery disease   . Hypercholesteremia   . Hypertension    Family History  Problem Relation Age of Onset  . Aneurysm Father   . Lung cancer Brother   . Prostate cancer Brother   . Heart failure Brother    Past Surgical History:  Procedure Laterality Date  . AORTIC VALVE REPLACEMENT  2006  . DILATION AND CURETTAGE OF UTERUS  1988  . SHOULDER SURGERY  09/1999  . SIGMOIDOSCOPY  1992   Patient Active Problem List   Diagnosis Date Noted  . Insomnia 03/18/2016  . Visual hallucinations 03/18/2016  . Vertigo 03/15/2016  . Paroxysmal atrial fibrillation with RVR ([place]) 11/27/2015  . Depression 11/27/2015  . Adaptation reaction 09/28/2015  . Aortic heart valve narrowing 09/28/2015  . Bilateral cataracts 09/28/2015  . Cardiac murmur 09/28/2015  . Chronic kidney disease (CKD), stage III (moderate) 09/28/2015  . History of gout 09/28/2015  . 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/2015  . OP (osteoporosis) 09/28/2015  . Atherosclerosis of  coronary artery 09/08/2015  . Avitaminosis D 07/11/2015  . LBP (low back pain) 07/11/2015  . BP (high blood pressure) 07/11/2015  . Hyperlipemia 04/12/2015  . Billowing mitral valve 08/03/2014  . Fibrosis of lung ([place]) 05/25/2014  . Asthma-chronic obstructive pulmonary disease overlap syndrome ([place]) 05/25/2014  . H/O aortic valve replacement 11/09/2004      Prior to Admission medications   Medication Sig Start Date End Date Taking? Authorizing Provider  acetaminophen (TYLENOL) 500 MG tablet Take 500-1,000 mg by mouth 2 (two) times daily as needed for mild pain, moderate pain, fever or headache.    Yes [provider]  albuterol (PROVENTIL) (2.5 MG/3ML) 0.083% nebulizer solution Take 2.5 mg by nebulization every 6 (six) hours as needed for wheezing or shortness of breath.   Yes [provider]  allopurinol (ZYLOPRIM) 100 MG tablet TAKE 1 TABLET BY MOUTH ONCE DAILY FOR GOUT. 05/13/19  Yes Trinna Post, PA-C  aspirin EC 81 MG tablet Take 81 mg by mouth daily.   Yes [provider]  escitalopram (LEXAPRO) 20 MG tablet TAKE 1 TABLET BY MOUTH  DAILY 05/13/19  Yes Pollak, Adriana M, PA-C  Fluticasone-Salmeterol (ADVAIR DISKUS) 250-50 MCG/DOSE AEPB USE ONE (1) INHALATION BY MOUTH EVERY 12 HOURS. RINSE MOUTH OUT AFTERUSE. 12/26/17  Yes Chauvin, Herbie [place], PA  furosemide (LASIX) 20 MG tablet TAKE 1 TABLET BY MOUTH  DAILY 05/13/19  Yes Terrilee Croak,  Wendee Beavers, PA-C  ipratropium-albuterol (DUONEB) 0.5-2.5 (3) MG/3ML SOLN Take 3 mLs by nebulization 3 (three) times daily.   Yes [provider]  metoprolol succinate (TOPROL-XL) 50 MG 24 hr tablet TAKE 1 TABLET BY MOUTH  DAILY 10/29/18  Yes Carmon Ginsberg, PA  simvastatin (ZOCOR) 20 MG tablet TAKE 1 TABLET BY MOUTH AT  BEDTIME 05/13/19  Yes Pollak, Adriana M, PA-C  traZODone (DESYREL) 50 MG tablet TAKE ONE-HALF TABLET BY  MOUTH AT BEDTIME 07/01/19  Yes Pollak, Adriana M, PA-C  fluticasone (FLONASE) 50 MCG/ACT nasal spray Place into  the nose. 12/08/17 12/08/18  [provider]    Allergies Iodine, Shellfish allergy, Azithromycin, and Sulfa antibiotics    Social History Social History   Tobacco Use  . Smoking status: Never Smoker  . Smokeless tobacco: Never Used  Substance Use Topics  . Alcohol use: No  . Drug use: No    Review of Systems Patient denies headaches, rhinorrhea, blurry vision, numbness, shortness of breath, chest pain, edema, cough, abdominal pain, nausea, vomiting, diarrhea, dysuria, fevers, rashes or hallucinations unless otherwise stated above in HPI. ____________________________________________   PHYSICAL EXAM:  VITAL SIGNS: Vitals:   07/19/19 1809 07/19/19 2341  BP: (!) 194/99 (!) 188/85  Pulse: 70 70  Resp: 16 17  Temp: 98.5 F (36.9 C)   SpO2: 98% 96%    Constitutional: Alert and oriented.  Eyes: Conjunctivae are normal.  Head: Atraumatic. Nose: No congestion/rhinnorhea. Mouth/Throat: Mucous membranes are moist.   Neck: No stridor. Painless ROM.  Cardiovascular: Normal rate, regular rhythm. Holosystolic murmur.  Good peripheral circulation. Respiratory: Normal respiratory effort.  No retractions. Lungs CTAB. Gastrointestinal: Soft and nontender. No distention. No abdominal bruits. No CVA tenderness. Genitourinary:  Musculoskeletal: RUE mid upper arm ttp.  N/v intact distally.  No lower extremity tenderness nor edema.  No joint effusions. Neurologic:  Normal speech and language. No gross focal neurologic deficits are appreciated. No facial droop Skin:  Skin is warm, dry, small skin tear to distal forearm . No rash noted. Psychiatric: Mood and affect are normal. Speech and behavior are normal.  ____________________________________________   LABS (all labs ordered are listed, but only abnormal results are displayed)  Results for orders placed or performed during the hospital encounter of 07/19/19 (from the past 24 hour(s))  Basic metabolic panel     Status:  Abnormal   Collection Time: 07/19/19  6:25 PM  Result Value Ref Range   Sodium 139 135 - 145 mmol/L   Potassium 4.1 3.5 - 5.1 mmol/L   Chloride 101 98 - 111 mmol/L   CO2 27 22 - 32 mmol/L   Glucose, Bld 160 (H) 70 - 99 mg/dL   BUN 31 (H) 8 - 23 mg/dL   Creatinine, Ser 1.26 (H) 0.44 - 1.00 mg/dL   Calcium 9.3 8.9 - 10.3 mg/dL   GFR calc non Af Amer 37 (L) >60 mL/min   GFR calc Af Amer 43 (L) >60 mL/min   Anion gap 11 5 - 15  CBC     Status: Abnormal   Collection Time: 07/19/19  6:25 PM  Result Value Ref Range   WBC 10.8 (H) 4.0 - 10.5 K/uL   RBC 3.72 (L) 3.87 - 5.11 MIL/uL   Hemoglobin 11.5 (L) 12.0 - 15.0 g/dL   HCT 35.4 (L) 36.0 - 46.0 %   MCV 95.2 80.0 - 100.0 fL   MCH 30.9 26.0 - 34.0 pg   MCHC 32.5 30.0 - 36.0 g/dL   RDW  12.8 11.5 - 15.5 %   Platelets 184 150 - 400 K/uL   nRBC 0.0 0.0 - 0.2 %   ____________________________________________  EKG My review and personal interpretation at Time: 18:22   Indication: syncope  Rate: 70  Rhythm: sinus Axis: normal Other: nonspeciic st and t wave abn, no stemi ____________________________________________  RADIOLOGY  I personally reviewed all radiographic images ordered to evaluate for the above acute complaints and reviewed radiology reports and findings.  These findings were personally discussed with the patient.  Please see medical record for radiology report.  ____________________________________________   PROCEDURES  Procedure(s) performed:  Procedures    Critical Care performed: no ____________________________________________   INITIAL IMPRESSION / ASSESSMENT AND PLAN / ED COURSE  Pertinent labs & imaging results that were available during my care of the patient were reviewed by me and considered in my medical decision making (see chart for details).   DDX: Dysrhythmia, aortic stenosis, dehydration, electrolyte abnormality, CHF, fracture, contusion, SDH  Marilyn Oconnell is a 85 y.o. who presents to the ED with  what sound to be increasing frequency of syncopal events presents the ER with head trauma and right upper extremity injury as described above.  Does have evidence of spiral fracture of the right humerus.  Case was discussed with Dr. Mack Guise of orthopedics who recommends posterior splint.  Patient given IV pain medication.  CT head shows no traumatic injury.  Does have contusion of the chin and nose but no septal hematoma.  No neck pain.  Blood work fairly reassuring but based on her age history presentation do feel she will require hospitalization for syncope evaluation.  Have discussed with the patient and available family all diagnostics and treatments performed thus far and all questions were answered to the best of my ability. The patient demonstrates understanding and agreement with plan.      The patient was evaluated in Emergency Department today for the symptoms described in the history of present illness. He/she was evaluated in the context of the global COVID-19 pandemic, which necessitated consideration that the patient might be at risk for infection with the SARS-CoV-2 virus that causes COVID-19. Institutional protocols and algorithms that pertain to the evaluation of patients at risk for COVID-19 are in a state of rapid change based on information released by regulatory bodies including the CDC and federal and state organizations. These policies and algorithms were followed during the patient's care in the ED.  As part of my medical decision making, I reviewed the following data within the [place] notes reviewed and incorporated, Labs reviewed, notes from prior ED visits and [place] Controlled Substance Database   ____________________________________________   FINAL CLINICAL IMPRESSION(S) / ED DIAGNOSES  Final diagnoses:  Syncope and collapse  Closed displaced spiral fracture of shaft of right humerus, initial encounter      NEW MEDICATIONS STARTED DURING  THIS VISIT:  New Prescriptions   No medications on file     Note:  This document was prepared using Dragon voice recognition software and may include unintentional dictation errors.    Merlyn Lot, MD 07/19/19 2248    Merlyn Lot, MD 07/19/19 [phone]

## 2019-07-19 NOTE — ED Triage Notes (Signed)
Patient presents to the ED post fall at home.  Patient lives at home alone and has some visual impairments and hearing impairments.  Patient states she doesn't exactly remember what happened but states sometimes she has dizzy spells that cause her to fall.  Patient hit her "life alert" button post fall.  Patient has bruising to her chin and small lacerations to her nose and lip.  Patient has a skin tear to her right arm per EMS and is complaining of right upper arm/shoulder pain.  Patient is alert and oriented x 4 at this time.

## 2019-07-19 NOTE — ED Notes (Signed)
Per dr. Quentin Cornwall pt does not need urine collected. Per wick placed per Gregor Dershem tech. Tech also collected and walked to lab COVID test.  Lm edt

## 2019-07-19 NOTE — Consult Note (Signed)
Called by Dr. Merlyn Lot from ER regarding this 85 y/o female who sustained an injury to her right upper extremity from a fall at home.   Dr. Quentin Cornwall reports the patient is NVI with skin closed.  I have reviewed the right humerus xrays demonstrating a minimally displaced spiral fracture just distal to a shoulder prosthesis.  I have recommended a posterior splint or coaptation splint to help stabilize the fracture along with a shoulder sling.  Patient may not require surgery if the fracture maintains its current position.  Patient may follow up in the office in 1 week for re-evaluation and xray.

## 2019-07-19 NOTE — ED Notes (Signed)
DAUGHTER # MICHELLE WHITE [phone]

## 2019-07-20 ENCOUNTER — Observation Stay
Admission: EM | Admit: 2019-07-20 | Discharge: 2019-07-20 | Disposition: A | Discharge status: Home / Self Care | Payer: Medicare Other | Source: Home / Self Care | Attending: Family Medicine | Admitting: Family Medicine

## 2019-07-20 ENCOUNTER — Ambulatory Visit: Payer: Medicare Other | Admitting: Physician Assistant

## 2019-07-20 ENCOUNTER — Observation Stay: Payer: Medicare Other

## 2019-07-20 ENCOUNTER — Other Ambulatory Visit: Payer: Self-pay

## 2019-07-20 DIAGNOSIS — S42331A Displaced oblique fracture of shaft of humerus, right arm, initial encounter for closed fracture: Secondary | ICD-10-CM | POA: Diagnosis not present

## 2019-07-20 DIAGNOSIS — R55 Syncope and collapse: Secondary | ICD-10-CM | POA: Diagnosis present

## 2019-07-20 DIAGNOSIS — I480 Paroxysmal atrial fibrillation: Secondary | ICD-10-CM | POA: Diagnosis present

## 2019-07-20 DIAGNOSIS — F329 Major depressive disorder, single episode, unspecified: Secondary | ICD-10-CM | POA: Diagnosis present

## 2019-07-20 DIAGNOSIS — I350 Nonrheumatic aortic (valve) stenosis: Secondary | ICD-10-CM | POA: Diagnosis not present

## 2019-07-20 DIAGNOSIS — R946 Abnormal results of thyroid function studies: Secondary | ICD-10-CM | POA: Diagnosis present

## 2019-07-20 DIAGNOSIS — I2510 Atherosclerotic heart disease of native coronary artery without angina pectoris: Secondary | ICD-10-CM | POA: Diagnosis present

## 2019-07-20 DIAGNOSIS — S0083XA Contusion of other part of head, initial encounter: Secondary | ICD-10-CM | POA: Diagnosis present

## 2019-07-20 DIAGNOSIS — S42341A Displaced spiral fracture of shaft of humerus, right arm, initial encounter for closed fracture: Secondary | ICD-10-CM | POA: Diagnosis present

## 2019-07-20 DIAGNOSIS — Z20828 Contact with and (suspected) exposure to other viral communicable diseases: Secondary | ICD-10-CM | POA: Diagnosis present

## 2019-07-20 DIAGNOSIS — M109 Gout, unspecified: Secondary | ICD-10-CM | POA: Diagnosis present

## 2019-07-20 DIAGNOSIS — I129 Hypertensive chronic kidney disease with stage 1 through stage 4 chronic kidney disease, or unspecified chronic kidney disease: Secondary | ICD-10-CM | POA: Diagnosis present

## 2019-07-20 DIAGNOSIS — T82858A Stenosis of vascular prosthetic devices, implants and grafts, initial encounter: Secondary | ICD-10-CM | POA: Diagnosis present

## 2019-07-20 DIAGNOSIS — Z8249 Family history of ischemic heart disease and other diseases of the circulatory system: Secondary | ICD-10-CM | POA: Diagnosis not present

## 2019-07-20 DIAGNOSIS — M810 Age-related osteoporosis without current pathological fracture: Secondary | ICD-10-CM | POA: Diagnosis present

## 2019-07-20 DIAGNOSIS — W1830XA Fall on same level, unspecified, initial encounter: Secondary | ICD-10-CM | POA: Diagnosis present

## 2019-07-20 DIAGNOSIS — J449 Chronic obstructive pulmonary disease, unspecified: Secondary | ICD-10-CM | POA: Diagnosis present

## 2019-07-20 DIAGNOSIS — I6523 Occlusion and stenosis of bilateral carotid arteries: Secondary | ICD-10-CM | POA: Diagnosis not present

## 2019-07-20 DIAGNOSIS — S00511A Abrasion of lip, initial encounter: Secondary | ICD-10-CM | POA: Diagnosis present

## 2019-07-20 DIAGNOSIS — I160 Hypertensive urgency: Secondary | ICD-10-CM | POA: Diagnosis present

## 2019-07-20 DIAGNOSIS — N183 Chronic kidney disease, stage 3 unspecified: Secondary | ICD-10-CM | POA: Diagnosis present

## 2019-07-20 DIAGNOSIS — F419 Anxiety disorder, unspecified: Secondary | ICD-10-CM | POA: Diagnosis present

## 2019-07-20 DIAGNOSIS — E860 Dehydration: Secondary | ICD-10-CM | POA: Diagnosis present

## 2019-07-20 DIAGNOSIS — D631 Anemia in chronic kidney disease: Secondary | ICD-10-CM | POA: Diagnosis present

## 2019-07-20 DIAGNOSIS — S42301A Unspecified fracture of shaft of humerus, right arm, initial encounter for closed fracture: Secondary | ICD-10-CM | POA: Diagnosis not present

## 2019-07-20 DIAGNOSIS — Y838 Other surgical procedures as the cause of abnormal reaction of the patient, or of later complication, without mention of misadventure at the time of the procedure: Secondary | ICD-10-CM | POA: Diagnosis present

## 2019-07-20 DIAGNOSIS — N179 Acute kidney failure, unspecified: Secondary | ICD-10-CM | POA: Diagnosis present

## 2019-07-20 DIAGNOSIS — E785 Hyperlipidemia, unspecified: Secondary | ICD-10-CM | POA: Diagnosis present

## 2019-07-20 DIAGNOSIS — Z953 Presence of xenogenic heart valve: Secondary | ICD-10-CM | POA: Diagnosis not present

## 2019-07-20 DIAGNOSIS — S0031XA Abrasion of nose, initial encounter: Secondary | ICD-10-CM | POA: Diagnosis present

## 2019-07-20 LAB — URINALYSIS, COMPLETE (UACMP) WITH MICROSCOPIC
Bacteria, UA: NONE SEEN
Bilirubin Urine: NEGATIVE
Glucose, UA: NEGATIVE mg/dL
Hgb urine dipstick: NEGATIVE
Ketones, ur: NEGATIVE mg/dL
Nitrite: NEGATIVE
Protein, ur: NEGATIVE mg/dL
Specific Gravity, Urine: 1.011 (ref 1.005–1.030)
pH: 5 (ref 5.0–8.0)

## 2019-07-20 LAB — CBC
HCT: 32.1 % — ABNORMAL LOW (ref 36.0–46.0)
Hemoglobin: 10.6 g/dL — ABNORMAL LOW (ref 12.0–15.0)
MCH: 30.9 pg (ref 26.0–34.0)
MCHC: 33 g/dL (ref 30.0–36.0)
MCV: 93.6 fL (ref 80.0–100.0)
Platelets: 165 10*3/uL (ref 150–400)
RBC: 3.43 MIL/uL — ABNORMAL LOW (ref 3.87–5.11)
RDW: 12.8 % (ref 11.5–15.5)
WBC: 10.4 10*3/uL (ref 4.0–10.5)
nRBC: 0 % (ref 0.0–0.2)

## 2019-07-20 LAB — BASIC METABOLIC PANEL
Anion gap: 11 (ref 5–15)
BUN: 25 mg/dL — ABNORMAL HIGH (ref 8–23)
CO2: 25 mmol/L (ref 22–32)
Calcium: 9.1 mg/dL (ref 8.9–10.3)
Chloride: 103 mmol/L (ref 98–111)
Creatinine, Ser: 1.08 mg/dL — ABNORMAL HIGH (ref 0.44–1.00)
GFR calc Af Amer: 51 mL/min — ABNORMAL LOW (ref >60)
GFR calc non Af Amer: 44 mL/min — ABNORMAL LOW (ref >60)
Glucose, Bld: 140 mg/dL — ABNORMAL HIGH (ref 70–99)
Potassium: 4 mmol/L (ref 3.5–5.1)
Sodium: 139 mmol/L (ref 135–145)

## 2019-07-20 LAB — ECHOCARDIOGRAM COMPLETE
Height: 61 in
Weight: 2016 oz

## 2019-07-20 LAB — TSH: TSH: 7.564 u[IU]/mL — ABNORMAL HIGH (ref 0.350–4.500)

## 2019-07-20 LAB — SARS CORONAVIRUS 2 (TAT 6-24 HRS): SARS Coronavirus 2: NEGATIVE

## 2019-07-20 LAB — TROPONIN I (HIGH SENSITIVITY): Troponin I (High Sensitivity): 14 ng/L (ref <18)

## 2019-07-20 IMAGING — US US CAROTID DUPLEX BILAT
1 series · 13 of 24 positions shown · non-contrast
Comparison: [DATE]

CLINICAL DATA: [AGE] female with a history of syncope

EXAM:
BILATERAL CAROTID DUPLEX ULTRASOUND
TECHNIQUE: Gray scale imaging, color Doppler and duplex ultrasound were
performed of bilateral carotid and vertebral arteries in the neck.

[Series 1: us carotid duplex bilat · 13 of 63 slices shown]
[im 1/63]
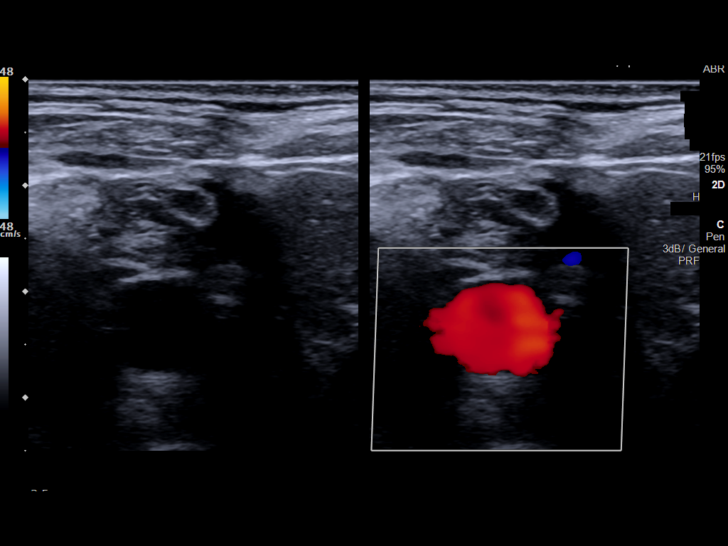
[im 6/63]
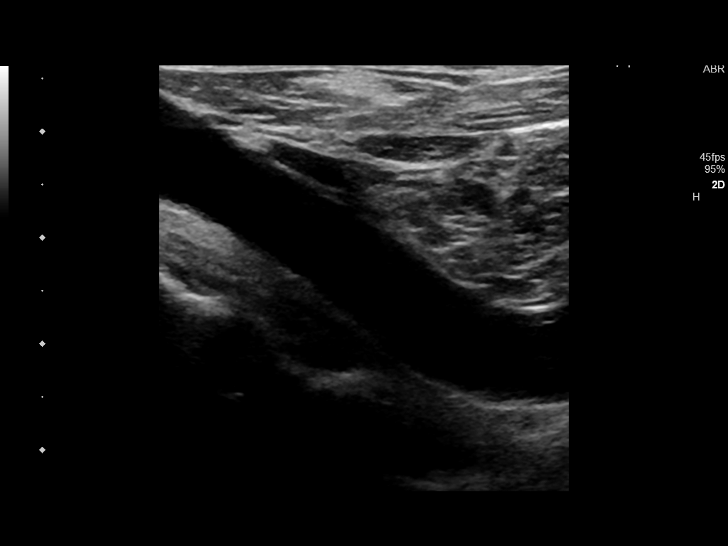
[im 11/63]
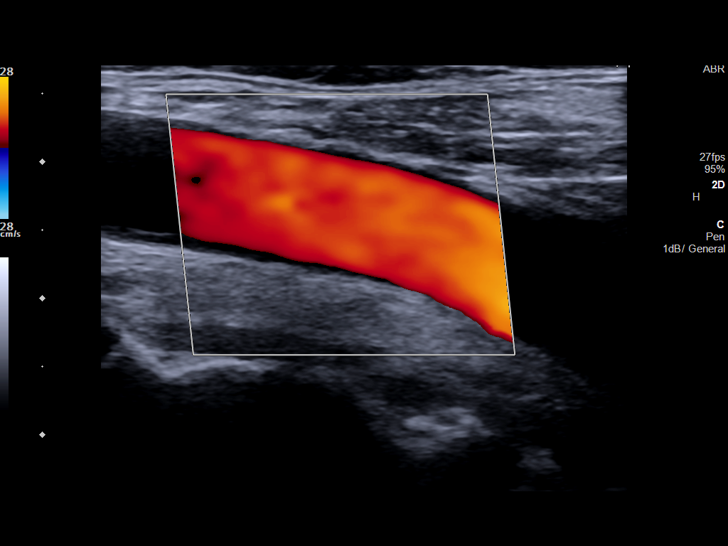
[im 17/63]
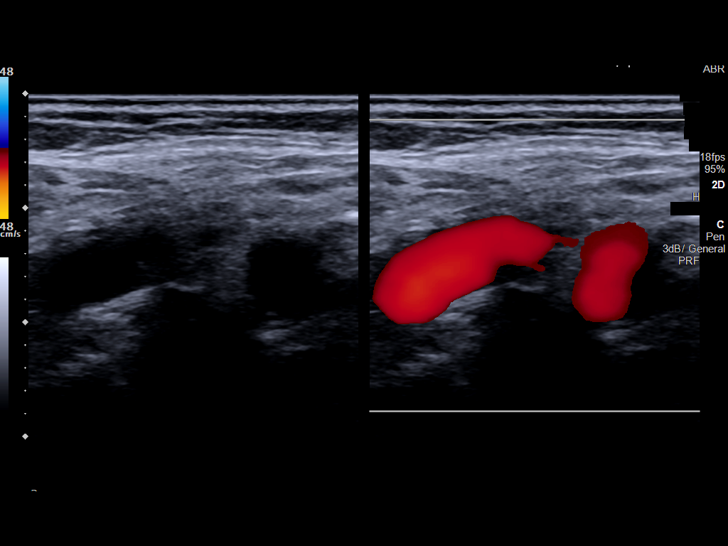
[im 22/63]
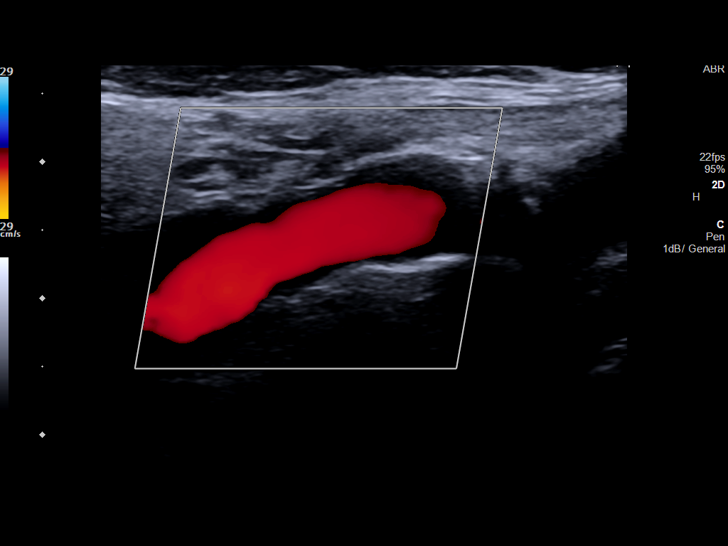
[im 27/63]
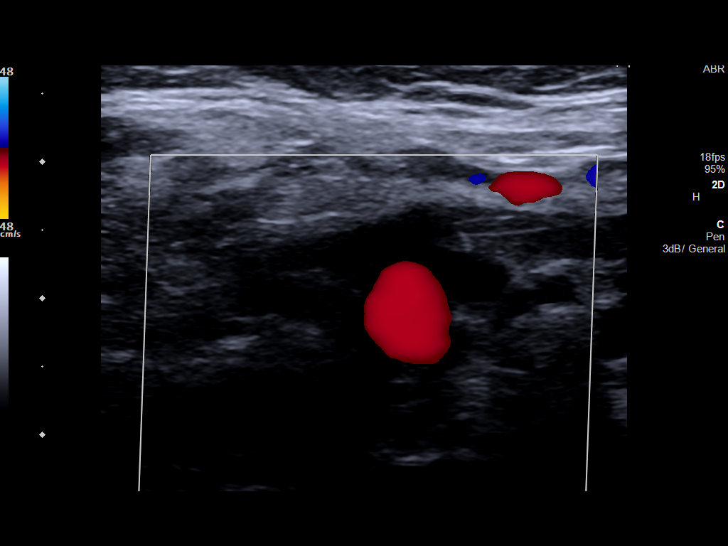
[im 33/63]
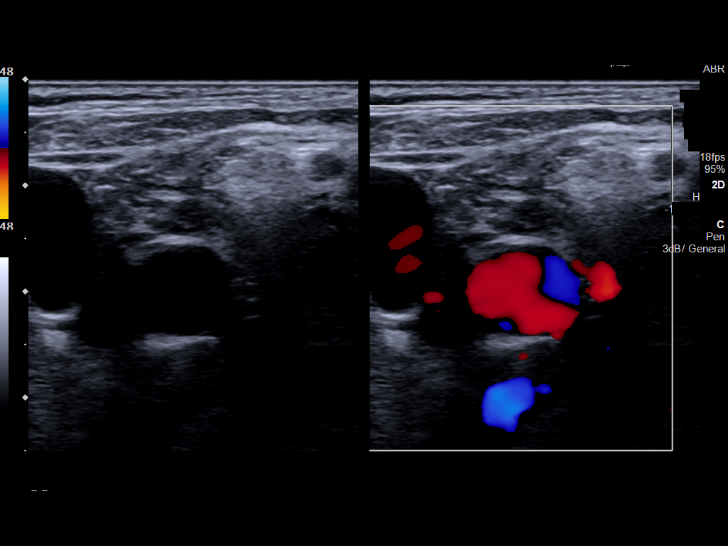
[im 36/63]
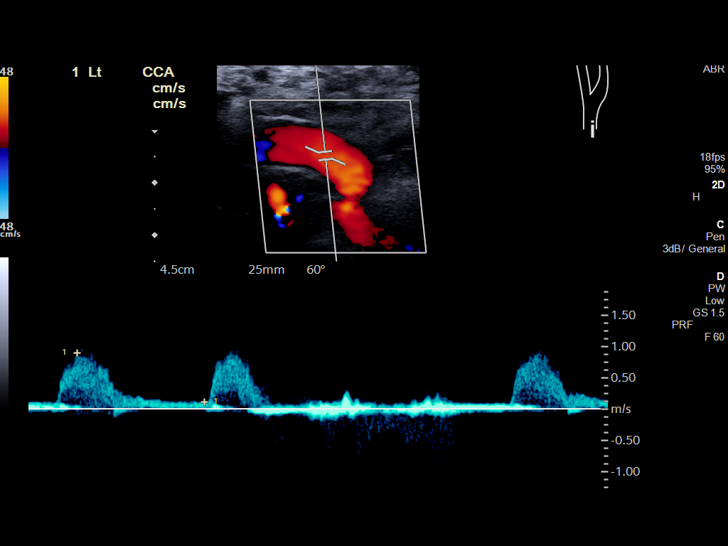
[im 41/63]
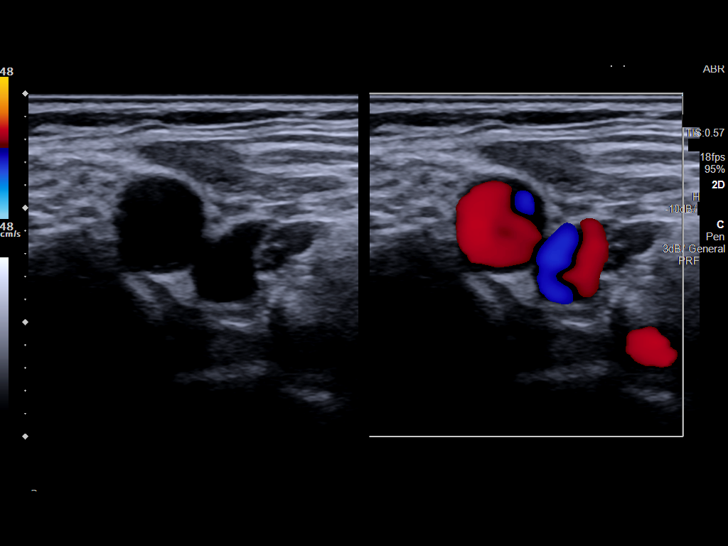
[im 46/63]
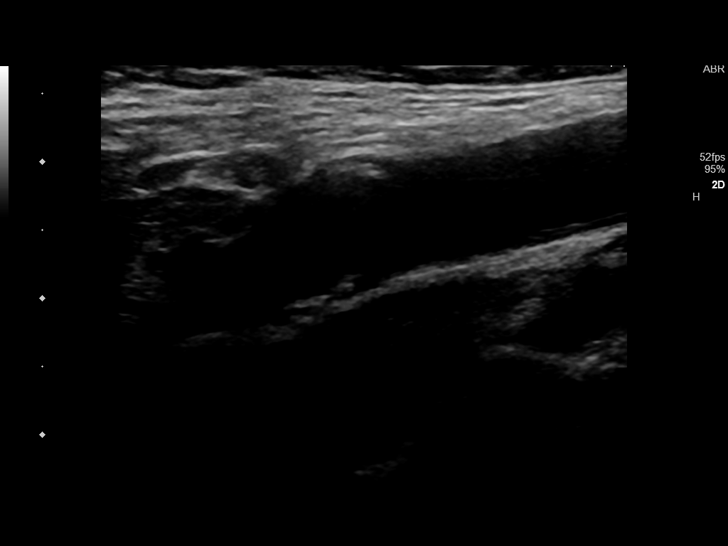
[im 52/63]
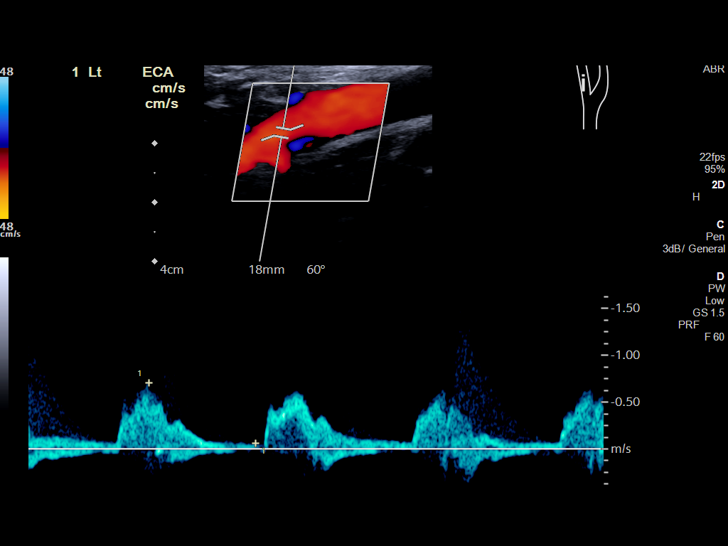
[im 57/63]
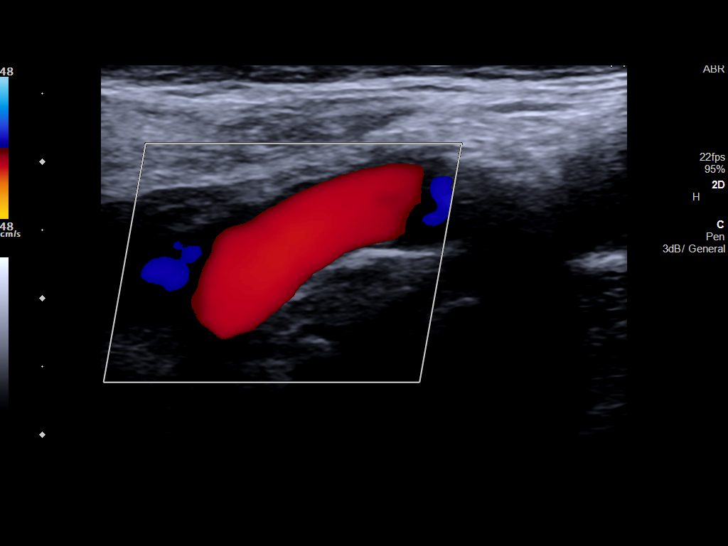
[im 63/63]
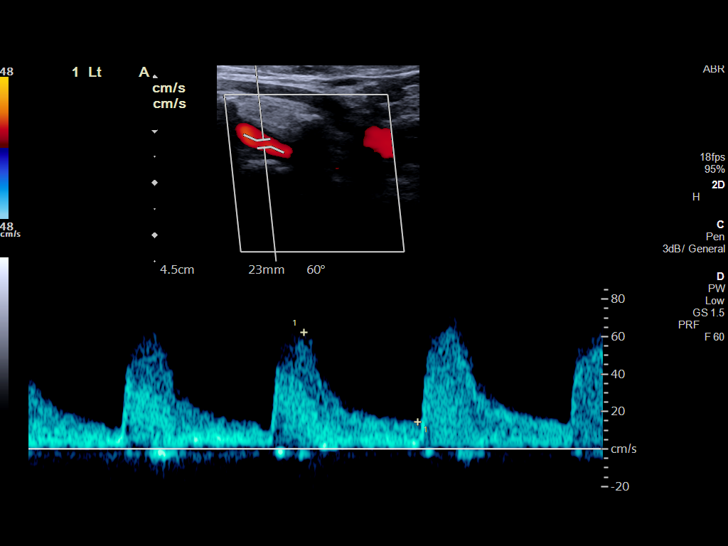

[13 of 24 positions shown; findings below may reference images not displayed]

FINDINGS: Criteria: Quantification of carotid stenosis is based on velocity
parameters that correlate the residual internal carotid diameter
with NASCET-based stenosis levels, using the diameter of the distal
internal carotid lumen as the denominator for stenosis measurement.

The following velocity measurements were obtained:

RIGHT

ICA:  Systolic 108 cm/sec, Diastolic 22 cm/sec

CCA:  83 cm/sec

SYSTOLIC ICA/CCA RATIO:

ECA:  65 cm/sec

LEFT

ICA:  Systolic 113 cm/sec, Diastolic 17 cm/sec

CCA:  92 cm/sec

SYSTOLIC ICA/CCA RATIO:

ECA:  71 cm/sec

Right Brachial SBP: Not acquired

Left Brachial SBP: Not acquired

RIGHT CAROTID ARTERY: No significant calcified disease of the right
common carotid artery. Intermediate waveform maintained.
Heterogeneous plaque without significant calcifications at the right
carotid bifurcation. Low resistance waveform of the right ICA. No
significant tortuosity.

RIGHT VERTEBRAL ARTERY: Antegrade flow with low resistance waveform.

LEFT CAROTID ARTERY: No significant calcified disease of the left
common carotid artery. Intermediate waveform maintained.
Heterogeneous plaque at the left carotid bifurcation without
significant calcifications. Low resistance waveform of the left ICA.

LEFT VERTEBRAL ARTERY:  Antegrade flow with low resistance waveform.
IMPRESSION: Color duplex indicates minimal heterogeneous plaque, with no
hemodynamically significant stenosis by duplex criteria in the
extracranial cerebrovascular circulation.

## 2019-07-20 MED ORDER — IPRATROPIUM-ALBUTEROL 20-100 MCG/ACT IN AERS
1.0000 | INHALATION_SPRAY | Freq: Three times a day (TID) | RESPIRATORY_TRACT | Status: DC
  Administered 2019-07-20 (×2): 1 via RESPIRATORY_TRACT
  Filled 2019-07-20: qty 4

## 2019-07-20 MED ORDER — IPRATROPIUM-ALBUTEROL 0.5-2.5 (3) MG/3ML IN SOLN
3.0000 mL | Freq: Three times a day (TID) | RESPIRATORY_TRACT | Status: DC
  Administered 2019-07-20 – 2019-07-21 (×3): 3 mL via RESPIRATORY_TRACT
  Filled 2019-07-20 (×3): qty 3

## 2019-07-20 MED ORDER — OXYCODONE-ACETAMINOPHEN 5-325 MG PO TABS
1.0000 | ORAL_TABLET | ORAL | Status: DC | PRN
  Administered 2019-07-20 (×2): 2 via ORAL
  Administered 2019-07-21: 1 via ORAL
  Administered 2019-07-21 (×2): 2 via ORAL
  Administered 2019-07-21: 1 via ORAL
  Filled 2019-07-20: qty 2
  Filled 2019-07-20: qty 1
  Filled 2019-07-20: qty 2
  Filled 2019-07-20: qty 1
  Filled 2019-07-20 (×2): qty 2

## 2019-07-20 MED ORDER — MORPHINE SULFATE (PF) 2 MG/ML IV SOLN
1.0000 mg | INTRAVENOUS | Status: DC | PRN
  Administered 2019-07-20 (×2): 2 mg via INTRAVENOUS
  Filled 2019-07-20 (×2): qty 1

## 2019-07-20 MED ORDER — TRAMADOL HCL 50 MG PO TABS: 50.0000 mg | ORAL_TABLET | Freq: Four times a day (QID) | ORAL | Status: DC | PRN

## 2019-07-20 MED ORDER — ALBUTEROL SULFATE HFA 108 (90 BASE) MCG/ACT IN AERS
2.0000 | INHALATION_SPRAY | Freq: Four times a day (QID) | RESPIRATORY_TRACT | Status: DC | PRN
  Filled 2019-07-20: qty 6.7

## 2019-07-20 MED ORDER — LABETALOL HCL 5 MG/ML IV SOLN
INTRAVENOUS | Status: AC
  Administered 2019-07-20: 20 mg via INTRAVENOUS
  Filled 2019-07-20: qty 4

## 2019-07-20 MED ORDER — MORPHINE SULFATE (PF) 2 MG/ML IV SOLN: 2.0000 mg | INTRAVENOUS | Status: DC | PRN

## 2019-07-20 MED ORDER — LABETALOL HCL 5 MG/ML IV SOLN
20.0000 mg | INTRAVENOUS | Status: DC | PRN
  Administered 2019-07-20 (×2): 20 mg via INTRAVENOUS
  Filled 2019-07-20: qty 4

## 2019-07-20 MED ORDER — ALBUTEROL SULFATE (2.5 MG/3ML) 0.083% IN NEBU: 2.5000 mg | INHALATION_SOLUTION | Freq: Four times a day (QID) | RESPIRATORY_TRACT | Status: DC | PRN

## 2019-07-20 NOTE — ED Notes (Signed)
ED TO INPATIENT HANDOFF REPORT  ED Nurse Name and Phone #:  Quillian Quince [place] Name/Age/Gender Marilyn Oconnell 85 y.o. female Room/Bed: ED15A/ED15A  Code Status   Code Status: Full Code  Home/SNF/Other Home Patient oriented to: self, place, time and situation Is this baseline? Yes   Triage Complete: Triage complete  Chief Complaint Fall, multiple injuies  Triage Note Patient presents to the ED post fall at home.  Patient lives at home alone and has some visual impairments and hearing impairments.  Patient states she doesn't exactly remember what happened but states sometimes she has dizzy spells that cause her to fall.  Patient hit her "life alert" button post fall.  Patient has bruising to her chin and small lacerations to her nose and lip.  Patient has a skin tear to her right arm per EMS and is complaining of right upper arm/shoulder pain.  Patient is alert and oriented x 4 at this time.     Allergies Allergies  Allergen Reactions  . Iodine Anaphylaxis  . Shellfish Allergy Anaphylaxis  . Azithromycin Hives  . Sulfa Antibiotics Hives    Level of Care/Admitting Diagnosis ED Disposition    ED Disposition Condition [facility]: [facility] [[id number]]  Level of Care: Telemetry [5]  Covid Evaluation: Asymptomatic Screening Protocol (No Symptoms)  Diagnosis: Recurrent syncope [[id number]]  Admitting Physician: Christel Mormon [[id number]]  Attending Physician: Christel Mormon [[id number]]  PT Class (Do Not Modify): Observation [104]  PT Acc Code (Do Not Modify): Observation [10022]       B Medical/Surgery History Past Medical History:  Diagnosis Date  . Arthritis   . CHF (congestive heart failure) ([place])   . COPD (chronic obstructive pulmonary disease) ([place])   . Coronary artery disease   . Hypercholesteremia   . Hypertension    Past Surgical History:  Procedure Laterality Date  . AORTIC VALVE REPLACEMENT  2006  . DILATION AND  CURETTAGE OF UTERUS  1988  . SHOULDER SURGERY  09/1999  . SIGMOIDOSCOPY  1992     A IV Location/Drains/Wounds Patient Lines/Drains/Airways Status   Active Line/Drains/Airways    None          Intake/Output Last 24 hours No intake or output data in the 24 hours ending 07/20/19 0005  Labs/Imaging Results for orders placed or performed during the hospital encounter of 07/19/19 (from the past 48 hour(s))  Basic metabolic panel     Status: Abnormal   Collection Time: 07/19/19  6:25 PM  Result Value Ref Range   Sodium 139 135 - 145 mmol/L   Potassium 4.1 3.5 - 5.1 mmol/L   Chloride 101 98 - 111 mmol/L   CO2 27 22 - 32 mmol/L   Glucose, Bld 160 (H) 70 - 99 mg/dL   BUN 31 (H) 8 - 23 mg/dL   Creatinine, Ser 1.26 (H) 0.44 - 1.00 mg/dL   Calcium 9.3 8.9 - 10.3 mg/dL   GFR calc non Af Amer 37 (L) >60 mL/min   GFR calc Af Amer 43 (L) >60 mL/min   Anion gap 11 5 - 15    Comment: Performed at [facility], [address] [place], [place] [address]  CBC     Status: Abnormal   Collection Time: 07/19/19  6:25 PM  Result Value Ref Range   WBC 10.8 (H) 4.0 - 10.5 K/uL   RBC 3.72 (L) 3.87 - 5.11 MIL/uL   Hemoglobin 11.5 (L)  12.0 - 15.0 g/dL   HCT 35.4 (L) 36.0 - 46.0 %   MCV 95.2 80.0 - 100.0 fL   MCH 30.9 26.0 - 34.0 pg   MCHC 32.5 30.0 - 36.0 g/dL   RDW 12.8 11.5 - 15.5 %   Platelets 184 150 - 400 K/uL   nRBC 0.0 0.0 - 0.2 %    Comment: Performed at [facility], [place], [place] [address]  Troponin I (High Sensitivity)     Status: None   Collection Time: 07/19/19 11:18 PM  Result Value Ref Range   Troponin I (High Sensitivity) 16 <18 ng/L    Comment: (NOTE) Elevated high sensitivity troponin I (hsTnI) values and significant  changes across serial measurements may suggest ACS but many other  chronic and acute conditions are known to elevate hsTnI results.  Refer to the "Links" section for chest pain algorithms and additional   guidance. Performed at [facility], [address] [place],  [address]    Dg Shoulder Right  Result Date: 07/19/2019 CLINICAL DATA:  Recent fall with right arm pain, initial encounter EXAM: RIGHT SHOULDER - 2+ VIEW COMPARISON:  None. FINDINGS: Right shoulder prosthesis is noted. There is a spiral fracture identified just distal to the tip of the prosthesis with mild displacement identified. No definitive periprosthetic fracture is seen. IMPRESSION: Spiral fracture in the midshaft of the right humerus just below the known shoulder prosthesis Electronically Signed   By: Inez Catalina M.D.   On: 07/19/2019 19:05   Ct Head Wo Contrast  Result Date: 07/19/2019 CLINICAL DATA:  Recent falls EXAM: CT HEAD WITHOUT CONTRAST TECHNIQUE: Contiguous axial images were obtained from the base of the skull through the vertex without intravenous contrast. COMPARISON:  05/29/2017 FINDINGS: Brain: Chronic atrophic and ischemic changes are noted similar to that seen on the prior exam. Bilateral basal ganglia calcifications are seen. No findings to suggest acute hemorrhage, acute infarction or space-occupying mass lesion noted. Vascular: No hyperdense vessel or unexpected calcification. Skull: Normal. Negative for fracture or focal lesion. Sinuses/Orbits: No acute finding. Other: None. IMPRESSION: Chronic atrophic and ischemic changes without acute abnormality. Electronically Signed   By: Inez Catalina M.D.   On: 07/19/2019 19:08   Dg Chest Portable 1 View  Result Date: 07/19/2019 CLINICAL DATA:  85 year old female with fall and syncope. EXAM: PORTABLE CHEST 1 VIEW COMPARISON:  Chest radiograph dated 11/27/2015 FINDINGS: Emphysematous changes of the lungs. No focal consolidation, pleural effusion, or pneumothorax. Diffuse interstitial prominence, likely chronic. Mild cardiomegaly. Median sternotomy wires and mechanical cardiac valve. Atherosclerotic calcification of the aorta. No acute osseous  pathology. Osteopenia with degenerative changes of the spine and shoulders. IMPRESSION: 1. No acute cardiopulmonary process. 2. Emphysema. 3. Mild cardiomegaly. Electronically Signed   By: Anner Crete M.D.   On: 07/19/2019 22:59    Pending Labs Unresulted Labs (From admission, onward)    Start     Ordered   07/20/19 3329  Basic metabolic panel  Tomorrow morning,   STAT     07/19/19 2357   07/20/19 0500  CBC  Tomorrow morning,   STAT     07/19/19 2357   07/19/19 2356  SARS CORONAVIRUS 2 (TAT 6-24 HRS) Nasopharyngeal Nasopharyngeal Swab  (Asymptomatic/Tier 2 Patients Labs)  ONCE - STAT,   STAT    Question Answer Comment  Is this test for diagnosis or screening Screening   Symptomatic for COVID-19 as defined by CDC No   Hospitalized for COVID-19 No  Admitted to ICU for COVID-19 No   Previously tested for COVID-19 No   Resident in a congregate (group) care setting Unknown   Employed in healthcare setting Unknown   Pregnant No      07/19/19 2355   07/19/19 2356  TSH  Once,   STAT     07/19/19 2357   07/19/19 1812  Urinalysis, Complete w Microscopic  ONCE - STAT,   STAT     07/19/19 1812          Vitals/Pain Today's Vitals   07/19/19 1809 07/19/19 1810 07/19/19 2341  BP: (!) 194/99  (!) 188/85  Pulse: 70  70  Resp: 16  17  Temp: 98.5 F (36.9 C)    TempSrc: Oral    SpO2: 98%  96%  Weight:  57.2 kg   Height:  5\' 1"  (1.549 m)   PainSc: 7       Isolation Precautions No active isolations  Medications Medications  fentaNYL (SUBLIMAZE) injection 25 mcg (25 mcg Intravenous Given 07/19/19 2321)  allopurinol (ZYLOPRIM) tablet 100 mg (has no administration in time range)  aspirin EC tablet 81 mg (has no administration in time range)  furosemide (LASIX) tablet 20 mg (has no administration in time range)  metoprolol succinate (TOPROL-XL) 24 hr tablet 50 mg (has no administration in time range)  simvastatin (ZOCOR) tablet 20 mg (has no administration in time range)   escitalopram (LEXAPRO) tablet 20 mg (has no administration in time range)  traZODone (DESYREL) tablet 25 mg (has no administration in time range)  albuterol (PROVENTIL) (2.5 MG/3ML) 0.083% nebulizer solution 2.5 mg (has no administration in time range)  fluticasone (FLONASE) 50 MCG/ACT nasal spray 2 spray (has no administration in time range)  ipratropium-albuterol (DUONEB) 0.5-2.5 (3) MG/3ML nebulizer solution 3 mL (has no administration in time range)  enoxaparin (LOVENOX) injection 40 mg (has no administration in time range)  0.9 %  sodium chloride infusion (has no administration in time range)  acetaminophen (TYLENOL) tablet 650 mg (has no administration in time range)    Or  acetaminophen (TYLENOL) suppository 650 mg (has no administration in time range)  traZODone (DESYREL) tablet 25 mg (has no administration in time range)  magnesium hydroxide (MILK OF MAGNESIA) suspension 30 mL (has no administration in time range)  ondansetron (ZOFRAN) tablet 4 mg (has no administration in time range)    Or  ondansetron (ZOFRAN) injection 4 mg (has no administration in time range)  ipratropium-albuterol (DUONEB) 0.5-2.5 (3) MG/3ML nebulizer solution 3 mL (3 mLs Nebulization Given 07/19/19 2320)  bacitracin ointment (1 application Topical Given 07/19/19 2317)    Mobility walks with device High fall risk   Focused Assessments Musculoskeletal   R Recommendations: See Admitting Provider Note  Report given to:   Additional Notes:

## 2019-07-20 NOTE — Consult Note (Signed)
ORTHOPAEDIC CONSULTATION  REQUESTING PHYSICIAN: Bettey Costa, MD  Chief Complaint: Right periprosthetic humeral shaft fracture  HPI: Marilyn Oconnell is a 85 y.o. female who complains of pain in the right shoulder after reported syncopal episode.  Patient lives by herself.  Patient has had a previous right shoulder hemiarthroplasty.  X-rays in the ER revealed a spiral fracture with mild displacement just distal to the tip of the prosthetic stem of the patient's hemiarthroplasty.  Patient's niece is at the bedside today.  The patient states that her pain is currently reasonably controlled.  She denies any numbness or tingling in her right hand.  Patient was admitted to the hospital service for work-up of her syncope.  Patient is in a sling with a posterior splint placed in the ER.  Past Medical History:  Diagnosis Date  . Arthritis   . CHF (congestive heart failure) ([place])   . COPD (chronic obstructive pulmonary disease) ([place])   . Coronary artery disease   . Hypercholesteremia   . Hypertension    Past Surgical History:  Procedure Laterality Date  . AORTIC VALVE REPLACEMENT  2006  . DILATION AND CURETTAGE OF UTERUS  1988  . SHOULDER SURGERY  09/1999  . SIGMOIDOSCOPY  1992   Social History   Socioeconomic History  . Marital status: Widowed    Spouse name: Not on file  . Number of children: Not on file  . Years of education: grade 8  . Highest education level: Not on file  Occupational History  . Not on file  Social Needs  . Financial resource strain: Not on file  . Food insecurity    Worry: Not on file    Inability: Not on file  . Transportation needs    Medical: Not on file    Non-medical: Not on file  Tobacco Use  . Smoking status: Never Smoker  . Smokeless tobacco: Never Used  Substance and Sexual Activity  . Alcohol use: No  . Drug use: No  . Sexual activity: Not on file  Lifestyle  . Physical activity    Days per week: Not on file    Minutes per session: Not on  file  . Stress: Not on file  Relationships  . Social Herbalist on phone: Not on file    Gets together: Not on file    Attends religious service: Not on file    Active member of club or organization: Not on file    Attends meetings of clubs or organizations: Not on file    Relationship status: Not on file  Other Topics Concern  . Not on file  Social History Narrative  . Not on file   Family History  Problem Relation Age of Onset  . Aneurysm Father   . Lung cancer Brother   . Prostate cancer Brother   . Heart failure Brother    Allergies  Allergen Reactions  . Iodine Anaphylaxis  . Shellfish Allergy Anaphylaxis  . Azithromycin Hives  . Sulfa Antibiotics Hives   Prior to Admission medications   Medication Sig Start Date End Date Taking? Authorizing Provider  acetaminophen (TYLENOL) 500 MG tablet Take 500-1,000 mg by mouth 2 (two) times daily as needed for mild pain, moderate pain, fever or headache.    Yes [provider]  albuterol (PROVENTIL) (2.5 MG/3ML) 0.083% nebulizer solution Take 2.5 mg by nebulization every 6 (six) hours as needed for wheezing or shortness of breath.   Yes [provider]  allopurinol (  ZYLOPRIM) 100 MG tablet TAKE 1 TABLET BY MOUTH ONCE DAILY FOR GOUT. 05/13/19  Yes Trinna Post, PA-C  aspirin EC 81 MG tablet Take 81 mg by mouth daily.   Yes [provider]  escitalopram (LEXAPRO) 20 MG tablet TAKE 1 TABLET BY MOUTH  DAILY 05/13/19  Yes Pollak, Adriana M, PA-C  Fluticasone-Salmeterol (ADVAIR DISKUS) 250-50 MCG/DOSE AEPB USE ONE (1) INHALATION BY MOUTH EVERY 12 HOURS. RINSE MOUTH OUT AFTERUSE. 12/26/17  Yes Chauvin, Herbie Baltimore, PA  furosemide (LASIX) 20 MG tablet TAKE 1 TABLET BY MOUTH  DAILY 05/13/19  Yes Pollak, Adriana M, PA-C  ipratropium-albuterol (DUONEB) 0.5-2.5 (3) MG/3ML SOLN Take 3 mLs by nebulization 3 (three) times daily.   Yes [provider]  metoprolol succinate (TOPROL-XL) 50 MG 24 hr tablet TAKE 1  TABLET BY MOUTH  DAILY 10/29/18  Yes Carmon Ginsberg, PA  simvastatin (ZOCOR) 20 MG tablet TAKE 1 TABLET BY MOUTH AT  BEDTIME 05/13/19  Yes Pollak, Adriana M, PA-C  traZODone (DESYREL) 50 MG tablet TAKE ONE-HALF TABLET BY  MOUTH AT BEDTIME 07/01/19  Yes Pollak, Adriana M, PA-C  fluticasone (FLONASE) 50 MCG/ACT nasal spray Place into the nose. 12/08/17 12/08/18  [provider]   Dg Shoulder Right  Result Date: 07/19/2019 CLINICAL DATA:  Recent fall with right arm pain, initial encounter EXAM: RIGHT SHOULDER - 2+ VIEW COMPARISON:  None. FINDINGS: Right shoulder prosthesis is noted. There is a spiral fracture identified just distal to the tip of the prosthesis with mild displacement identified. No definitive periprosthetic fracture is seen. IMPRESSION: Spiral fracture in the midshaft of the right humerus just below the known shoulder prosthesis Electronically Signed   By: Inez Catalina M.D.   On: 07/19/2019 19:05   Ct Head Wo Contrast  Result Date: 07/19/2019 CLINICAL DATA:  Recent falls EXAM: CT HEAD WITHOUT CONTRAST TECHNIQUE: Contiguous axial images were obtained from the base of the skull through the vertex without intravenous contrast. COMPARISON:  05/29/2017 FINDINGS: Brain: Chronic atrophic and ischemic changes are noted similar to that seen on the prior exam. Bilateral basal ganglia calcifications are seen. No findings to suggest acute hemorrhage, acute infarction or space-occupying mass lesion noted. Vascular: No hyperdense vessel or unexpected calcification. Skull: Normal. Negative for fracture or focal lesion. Sinuses/Orbits: No acute finding. Other: None. IMPRESSION: Chronic atrophic and ischemic changes without acute abnormality. Electronically Signed   By: Inez Catalina M.D.   On: 07/19/2019 19:08   US Carotid Bilateral  Result Date: 07/20/2019 CLINICAL DATA:  85 year old female with a history of syncope EXAM: BILATERAL CAROTID DUPLEX ULTRASOUND TECHNIQUE: Pearline Cables scale imaging, color  Doppler and duplex ultrasound were performed of bilateral carotid and vertebral arteries in the neck. COMPARISON:  05/29/2017 FINDINGS: Criteria: Quantification of carotid stenosis is based on velocity parameters that correlate the residual internal carotid diameter with NASCET-based stenosis levels, using the diameter of the distal internal carotid lumen as the denominator for stenosis measurement. The following velocity measurements were obtained: RIGHT ICA:  Systolic 962 cm/sec, Diastolic 22 cm/sec CCA:  83 cm/sec SYSTOLIC ICA/CCA RATIO:  1.3 ECA:  65 cm/sec LEFT ICA:  Systolic 229 cm/sec, Diastolic 17 cm/sec CCA:  92 cm/sec SYSTOLIC ICA/CCA RATIO:  1.2 ECA:  71 cm/sec Right Brachial SBP: Not acquired Left Brachial SBP: Not acquired RIGHT CAROTID ARTERY: No significant calcified disease of the right common carotid artery. Intermediate waveform maintained. Heterogeneous plaque without significant calcifications at the right carotid bifurcation. Low resistance waveform of the right ICA. No significant tortuosity. RIGHT  VERTEBRAL ARTERY: Antegrade flow with low resistance waveform. LEFT CAROTID ARTERY: No significant calcified disease of the left common carotid artery. Intermediate waveform maintained. Heterogeneous plaque at the left carotid bifurcation without significant calcifications. Low resistance waveform of the left ICA. LEFT VERTEBRAL ARTERY:  Antegrade flow with low resistance waveform. IMPRESSION: Color duplex indicates minimal heterogeneous plaque, with no hemodynamically significant stenosis by duplex criteria in the extracranial cerebrovascular circulation. Signed, Dulcy Fanny. Dellia Nims, RPVI [facility] Electronically Signed   By: Corrie Mckusick D.O.   On: 07/20/2019 09:46   Dg Chest Portable 1 View  Result Date: 07/19/2019 CLINICAL DATA:  85 year old female with fall and syncope. EXAM: PORTABLE CHEST 1 VIEW COMPARISON:  Chest radiograph  dated 11/27/2015 FINDINGS: Emphysematous changes of the lungs. No focal consolidation, pleural effusion, or pneumothorax. Diffuse interstitial prominence, likely chronic. Mild cardiomegaly. Median sternotomy wires and mechanical cardiac valve. Atherosclerotic calcification of the aorta. No acute osseous pathology. Osteopenia with degenerative changes of the spine and shoulders. IMPRESSION: 1. No acute cardiopulmonary process. 2. Emphysema. 3. Mild cardiomegaly. Electronically Signed   By: Anner Crete M.D.   On: 07/19/2019 22:59    Positive ROS: All other systems have been reviewed and were otherwise negative with the exception of those mentioned in the HPI and as above.  Physical Exam: General: Alert, no acute distress  MUSCULOSKELETAL: Patient has a posterior splint in place.  The emergency room physician overnight reported the patient skin was intact.  Patient distally can flex and extend all 5 digits of the right hand.  Her fingers are well-perfused and she has a palpable radial pulse.  Patient has intact sensation light touch throughout the right upper extremity.  There is no obvious deformity or significant swelling seen.  Assessment: Right minimally displaced periprosthetic humeral shaft fracture  Plan: I have reviewed the patient's x-rays.  The patient has minimal displacement and no significant angulation of her humeral shaft fracture.  I am recommending nonoperative management for this 85 year old female with a humeral shaft fracture.  Patient will continue her posterior splint and sling.  She was instructed to avoid any active forward elevation, abduction or weightbearing or lifting with the right upper extremity until she follows up with me in the office within 1 week. Patient would benefit from physical therapy evaluation as she may require a cane or hemiwalker use with her left upper extremity to help her with stability and balance.  Patient would benefit from home health physical  therapy.  Her niece is involved in the patient's care.  The plan will be to treat the patient in a fracture brace upon her follow-up in the office.   Thornton Park, MD    07/20/2019 12:39 PM

## 2019-07-20 NOTE — Evaluation (Signed)
Physical Therapy Evaluation Patient Details Name: Marilyn Oconnell MRN: [id number] DOB: Aug 06, 1924 Today's Date: 07/20/2019   History of Present Illness  Per MD: PT is a 85 y.o. Caucasian female with a known history of CHF, COPD, coronary artery disease, hypertension and dyslipidemia, presented to the emergency room with acute onset of recurrent presyncope with likely syncopal episode today when she fell face forward and hit her shin subsequent contusion as well as nasal and lip abrasions.  She had subsequent right arm pain.  She has been having headache and she admitted to recent lightheadedness and dizzy spells with presyncope.  MD assessment includes: Presyncope with minor contusion to chin/lip, minimally displaced spiral fracture just distal to a shoulder prosthesis. Patient may not require surgery if the fracture maintains its current position (She will need outpatient follow up in 1 week with Dr Octavio Manns), AKI - This improved with IVF, HTN urgency - This has improved with pain control, Depression/Anxiety - Continue Lexapro, COPD without signs of exacerbation  Clinical Impression  Pt presented with deficits in strength, transfers, mobility, gait, and balance. Pt's pain was 6/10 at rest, with only one spike to 8/10 while completed bed exercises. Pt completed exercises and vitals were check: BP 158/59, SpO2 92%, and HR 71. Pt required mod A to get to EOB, completed sit<>stand with good concentric and eccentric control, and ambulated with close CGA. Pt's gait swayed from side to side, had very low clearance, hand was shaky on the [facility], she managed turns without LOB, though she did ask for one pause to recover her balance. Pt will benefit from HHPT services upon discharge to safely address above deficits for decreased caregiver assistance and eventual return to PLOF.     Follow Up Recommendations Home health PT;Supervision for mobility/OOB    Equipment Recommendations  None recommended by PT     Recommendations for Other Services       Precautions / Restrictions Precautions Precaution Comments: High fall risk Restrictions Weight Bearing Restrictions: Yes RUE Weight Bearing: Non weight bearing Other Position/Activity Restrictions: Per MD: No RUE elevation, abduction, weightbearing, or lifting      Mobility  Bed Mobility Overal bed mobility: Needs Assistance Bed Mobility: Rolling;Supine to Sit;Sit to Supine Rolling: Supervision   Supine to sit: Mod assist Sit to supine: Supervision   General bed mobility comments: Pt completed bed mobility with mod A to get to EOB, but managed rolling and getting back in bed with min verbal cuing.  Transfers Overall transfer level: Needs assistance Equipment used: Straight cane Transfers: Sit to/from Stand Sit to Stand: Min guard         General transfer comment: Pt required cuing to setup for sit<>stand transfers and maintain stability.  Ambulation/Gait Ambulation/Gait assistance: Min guard Gait Distance (Feet): 100 Feet Assistive device: Straight cane Gait Pattern/deviations: Step-through pattern;Decreased step length - right;Decreased step length - left;Drifts right/left Gait velocity: Decreased   General Gait Details: SPC was shaky in L hand and not supplying much support, pt took short steps with low clearance, required one pause so she could "get my balance".  Stairs            Wheelchair Mobility    Modified Rankin (Stroke Patients Only)       Balance Overall balance assessment: Needs assistance Sitting-balance support: Single extremity supported;Feet unsupported Sitting balance-Leahy Scale: Good     Standing balance support: Single extremity supported;During functional activity Standing balance-Leahy Scale: Fair Standing balance comment: Pt exhibited small wobble while standing supported by  SPC.                             Pertinent Vitals/Pain Pain Assessment: Faces Pain Score: 6   Faces Pain Scale: Hurts whole lot Pain Location: R arm and shoulder, also her lip and chin Pain Descriptors / Indicators: Aching;Contraction;Sharp;Throbbing Pain Intervention(s): Limited activity within patient's tolerance;Monitored during session    [place] expects to be discharged to:: Private residence   Available Help at Discharge: Family;Available 24 hours/day;Personal care attendant(Niece has arranged for 24 hr CNA caregiver A.) Type of Home: House Home Access: Stairs to enter Entrance Stairs-Rails: Can reach both;Left;Right Entrance Stairs-Number of Steps: 5 steps, shortened/blind-friendly Home Layout: One level Home Equipment: Cane - single point;Shower seat - built in      Prior Function Level of Independence: Independent with assistive device(s)         Comments: Pt was Ind with ADL, received some help with meal prep from niece, Sharyn Lull, uses [facility] for [facility] and community ambulation. Does not drive, legally blind.     Hand Dominance   Dominant Hand: Right    Extremity/Trunk Assessment   Upper Extremity Assessment Upper Extremity Assessment: RUE deficits/detail RUE Deficits / Details: S/P R femur spiral fx RUE: Unable to fully assess due to pain;Unable to fully assess due to immobilization RUE Sensation: WNL    Lower Extremity Assessment Lower Extremity Assessment: Generalized weakness    Cervical / Trunk Assessment Cervical / Trunk Assessment: Kyphotic  Communication   Communication: HOH  Cognition Arousal/Alertness: Awake/alert Behavior During Therapy: WFL for tasks assessed/performed Overall Cognitive Status: Within Functional Limits for tasks assessed                                        General Comments      Exercises Total Joint Exercises Ankle Circles/Pumps: AROM;Strengthening;Both;10 reps;15 reps Quad Sets: AROM;Strengthening;Both;10 reps Gluteal Sets: AROM;Strengthening;Both;10 reps Towel Squeeze:  AROM;Strengthening;Both;5 reps Heel Slides: AROM;Strengthening;Both;10 reps;15 reps Hip ABduction/ADduction: AROM;Strengthening;Both;5 reps Long Arc Quad: AROM;Strengthening;Both;10 reps Marching in Standing: AROM;Strengthening;Standing;Both;10 reps Other Exercises Other Exercises: Pt in static and dynamic sitting at EOB for ~8 mins.   Assessment/Plan    PT Assessment Patient needs continued PT services  PT Problem List Decreased strength;Decreased balance;Decreased mobility       PT Treatment Interventions DME instruction;Gait training;Stair training;Functional mobility training;Therapeutic activities;Therapeutic exercise;Patient/family education    PT Goals (Current goals can be found in the Care Plan section)  Acute Rehab PT Goals Patient Stated Goal: Learn to walk better PT Goal Formulation: With patient Time For Goal Achievement: 08/02/19 Potential to Achieve Goals: Good    Frequency 7X/week   Barriers to discharge        Co-evaluation               AM-PAC PT "6 Clicks" Mobility  Outcome Measure Help needed turning from your back to your side while in a flat bed without using bedrails?: A Little Help needed moving from lying on your back to sitting on the side of a flat bed without using bedrails?: A Lot Help needed moving to and from a bed to a chair (including a wheelchair)?: A Little Help needed standing up from a chair using your arms (e.g., wheelchair or bedside chair)?: A Little Help needed to walk in hospital room?: A Little Help needed climbing 3-5 steps with a  railing? : A Lot 6 Click Score: 16    End of Session Equipment Utilized During Treatment: Gait belt Activity Tolerance: Patient tolerated treatment well Patient left: in chair;with chair alarm set Nurse Communication: Mobility status PT Visit Diagnosis: Unsteadiness on feet (R26.81);Muscle weakness (generalized) (M62.81);Difficulty in walking, not elsewhere classified (R26.2)    Time:  1440-1530 PT Time Calculation (min) (ACUTE ONLY): 50 min   Charges:              Juanda Crumble "Gus" Katerra Ingman, SPT  07/20/19, 4:20 PM

## 2019-07-20 NOTE — Progress Notes (Signed)
PHARMACIST - PHYSICIAN COMMUNICATION  CONCERNING:  Enoxaparin (Lovenox) for DVT Prophylaxis    RECOMMENDATION: Patient was prescribed enoxaprin 40mg  q24 hours for VTE prophylaxis.   Filed Weights   07/19/19 1810  Weight: 126 lb (57.2 kg)    Body mass index is 23.81 kg/m.  Estimated Creatinine Clearance: 21 mL/min (A) (by C-G formula based on SCr of 1.26 mg/dL (H)).  Patient is candidate for enoxaparin 30mg  every 24 hours based on CrCl <42ml/min or Weight less then 45kg   DESCRIPTION: Pharmacy has adjusted enoxaparin dose per [facility] policy.  Patient is now receiving enoxaparin 30mg  every 24 hours.  Ena Dawley, PharmD Clinical Pharmacist  07/20/2019 2:50 AM

## 2019-07-20 NOTE — Progress Notes (Signed)
[place] [facility] [place] NAME: Marilyn Oconnell    MR#:  [id number]  DATE OF BIRTH:  06/23/1924  SUBJECTIVE:   patient had fall and came to ED with right arm pain  stil having arm pain this am  Arm in sling  REVIEW OF SYSTEMS:    Review of Systems  Constitutional: Negative for fever, chills weight loss HENT: Negative for ear pain, nosebleeds, congestion, facial swelling, rhinorrhea, neck pain, neck stiffness and ear discharge.   Respiratory: Negative for cough, shortness of breath, wheezing  Cardiovascular: Negative for chest pain, palpitations and leg swelling.  Gastrointestinal: Negative for heartburn, abdominal pain, vomiting, diarrhea or consitpation Genitourinary: Negative for dysuria, urgency, frequency, hematuria Musculoskeletal: Negative for back pain ++Arm pain  Neurological: Negative for dizziness, seizures, ++PREsyncope, no focal weakness,  numbness and headaches.  Hematological: Does not bruise/bleed easily.  Psychiatric/Behavioral: Negative for hallucinations, confusion, dysphoric mood    Tolerating Diet: yes      DRUG ALLERGIES:   Allergies  Allergen Reactions  . Iodine Anaphylaxis  . Shellfish Allergy Anaphylaxis  . Azithromycin Hives  . Sulfa Antibiotics Hives    VITALS:  Blood pressure (!) 164/57, pulse 69, temperature 98.3 F (36.8 C), temperature source Oral, resp. rate 19, height 5\' 1"  (1.549 m), weight 57.2 kg, SpO2 94 %.  PHYSICAL EXAMINATION:  Constitutional: Appears well-developed and well-nourished. No distress. HENT: Normocephalic. Marland Kitchen Oropharynx is clear and moist.  Eyes: Conjunctivae and EOM are normal. PERRLA, no scleral icterus.  Neck: Normal ROM. Neck supple. No JVD. No tracheal deviation. CVS: RRR, S1/S2 +, 3/6 murmurs, no gallops, no carotid bruit.  Pulmonary: Effort and breath sounds normal, no stridor, rhonchi, wheezes, rales.  Abdominal: Soft. BS +,  no distension, tenderness, rebound or  guarding.  Musculoskeletal: sling RUE No edema and no tenderness.  Neuro: Alert. CN 2-12 grossly intact. No focal deficits. Skin:bruising to her chin and small lacerations to her nose and lip. Psychiatric: Normal mood and affect.      LABORATORY PANEL:   CBC Recent Labs  Lab 07/20/19 0557  WBC 10.4  HGB 10.6*  HCT 32.1*  PLT 165   ------------------------------------------------------------------------------------------------------------------  Chemistries  Recent Labs  Lab 07/20/19 0557  NA 139  K 4.0  CL 103  CO2 25  GLUCOSE 140*  BUN 25*  CREATININE 1.08*  CALCIUM 9.1   ------------------------------------------------------------------------------------------------------------------  Cardiac Enzymes No results for input(s): TROPONINI in the last 168 hours. ------------------------------------------------------------------------------------------------------------------  RADIOLOGY:  Dg Shoulder Right  Result Date: 07/19/2019 CLINICAL DATA:  Recent fall with right arm pain, initial encounter EXAM: RIGHT SHOULDER - 2+ VIEW COMPARISON:  None. FINDINGS: Right shoulder prosthesis is noted. There is a spiral fracture identified just distal to the tip of the prosthesis with mild displacement identified. No definitive periprosthetic fracture is seen. IMPRESSION: Spiral fracture in the midshaft of the right humerus just below the known shoulder prosthesis Electronically Signed   By: Inez Catalina M.D.   On: 07/19/2019 19:05   Ct Head Wo Contrast  Result Date: 07/19/2019 CLINICAL DATA:  Recent falls EXAM: CT HEAD WITHOUT CONTRAST TECHNIQUE: Contiguous axial images were obtained from the base of the skull through the vertex without intravenous contrast. COMPARISON:  05/29/2017 FINDINGS: Brain: Chronic atrophic and ischemic changes are noted similar to that seen on the prior exam. Bilateral basal ganglia calcifications are seen. No findings to suggest acute hemorrhage, acute  infarction or space-occupying mass lesion noted. Vascular: No hyperdense vessel or unexpected calcification. Skull:  Normal. Negative for fracture or focal lesion. Sinuses/Orbits: No acute finding. Other: None. IMPRESSION: Chronic atrophic and ischemic changes without acute abnormality. Electronically Signed   By: Inez Catalina M.D.   On: 07/19/2019 19:08   US Carotid Bilateral  Result Date: 07/20/2019 CLINICAL DATA:  85 year old female with a history of syncope EXAM: BILATERAL CAROTID DUPLEX ULTRASOUND TECHNIQUE: Pearline Cables scale imaging, color Doppler and duplex ultrasound were performed of bilateral carotid and vertebral arteries in the neck. COMPARISON:  05/29/2017 FINDINGS: Criteria: Quantification of carotid stenosis is based on velocity parameters that correlate the residual internal carotid diameter with NASCET-based stenosis levels, using the diameter of the distal internal carotid lumen as the denominator for stenosis measurement. The following velocity measurements were obtained: RIGHT ICA:  Systolic 563 cm/sec, Diastolic 22 cm/sec CCA:  83 cm/sec SYSTOLIC ICA/CCA RATIO:  1.3 ECA:  65 cm/sec LEFT ICA:  Systolic 875 cm/sec, Diastolic 17 cm/sec CCA:  92 cm/sec SYSTOLIC ICA/CCA RATIO:  1.2 ECA:  71 cm/sec Right Brachial SBP: Not acquired Left Brachial SBP: Not acquired RIGHT CAROTID ARTERY: No significant calcified disease of the right common carotid artery. Intermediate waveform maintained. Heterogeneous plaque without significant calcifications at the right carotid bifurcation. Low resistance waveform of the right ICA. No significant tortuosity. RIGHT VERTEBRAL ARTERY: Antegrade flow with low resistance waveform. LEFT CAROTID ARTERY: No significant calcified disease of the left common carotid artery. Intermediate waveform maintained. Heterogeneous plaque at the left carotid bifurcation without significant calcifications. Low resistance waveform of the left ICA. LEFT VERTEBRAL ARTERY:  Antegrade flow with low  resistance waveform. IMPRESSION: Color duplex indicates minimal heterogeneous plaque, with no hemodynamically significant stenosis by duplex criteria in the extracranial cerebrovascular circulation. Signed, Dulcy Fanny. Dellia Nims, RPVI [facility] Radiology Electronically Signed   By: Corrie Mckusick D.O.   On: 07/20/2019 09:46   Dg Chest Portable 1 View  Result Date: 07/19/2019 CLINICAL DATA:  85 year old female with fall and syncope. EXAM: PORTABLE CHEST 1 VIEW COMPARISON:  Chest radiograph dated 11/27/2015 FINDINGS: Emphysematous changes of the lungs. No focal consolidation, pleural effusion, or pneumothorax. Diffuse interstitial prominence, likely chronic. Mild cardiomegaly. Median sternotomy wires and mechanical cardiac valve. Atherosclerotic calcification of the aorta. No acute osseous pathology. Osteopenia with degenerative changes of the spine and shoulders. IMPRESSION: 1. No acute cardiopulmonary process. 2. Emphysema. 3. Mild cardiomegaly. Electronically Signed   By: Anner Crete M.D.   On: 07/19/2019 22:59     ASSESSMENT AND PLAN:   85 y/o female with HTN and COPD who presented to ED with a fall after feeling dizzy.  1. Presyncope with minor contusion to chin/lip: Carotid doppler without significant stenosis Tele without any acute etiology She does have AS which may be contributing to recurrent events Follow up on Cardiology consult and ECHO  She also has mild ortostasis from dehydration with AKI. Her BP was very elevated as well al contributing to presnycope  2. Minimally displaced spiral fracture just distal to a shoulder prosthesis. Ortho has recommended a posterior splint or coaptation splint to help stabilize the fracture along with a shoulder sling.Patient may not require surgery if the fracture maintains its current position She will need outpatient follow up in 1 week with Dr Octavio Manns Continus pain control and ISS   3. AKI:  This improved with IVF  4. HTN urgency: This has improved with pain control Continue Metoprolol Labetolol PRN  5. Depression/Anxiety: Continue Lexapro  6. COPD without signs of exacerbation 7. Elevated TSH check T4/T3  PT/OT CM for d/c planning outpatient PC needed at discharge.  COVID pending  Management plans discussed with the patient and she is in agreement.  CODE STATUS: FULL  TOTAL TIME TAKING CARE OF THIS PATIENT: 26 minutes.     POSSIBLE D/C tomorrow, DEPENDING ON CLINICAL CONDITION.   Bettey Costa M.D on 07/20/2019 at 11:02 AM  Between 7am to 6pm - Pager - [phone] After 6pm go to [email or web address] - password [email or web address] [facility]  Office  [phone]  CC: Primary care physician; Trinna Post, PA-C  Note: This dictation was prepared with Dragon dictation along with smaller phrase technology. Any transcriptional errors that result from this process are unintentional.

## 2019-07-20 NOTE — H&P (Signed)
[facility] NAME: Marilyn Oconnell    MR#:  [id number]  DATE OF BIRTH:  [birth year]  DATE OF ADMISSION:  07/19/2019  PRIMARY CARE PHYSICIAN: Trinna Post, PA-C   REQUESTING/REFERRING PHYSICIAN: Merlyn Lot, MD  CHIEF COMPLAINT:   Chief Complaint  Patient presents with  . Fall    HISTORY OF PRESENT ILLNESS:  Marilyn Oconnell  is a 85 y.o. Caucasian female with a known history of CHF, COPD, coronary artery disease, hypertension and dyslipidemia, presented to the emergency room with acute onset of recurrent presyncope with likely syncopal episode today when she fell face forward and hit her shin subsequent contusion as well as nasal and lip abrasions.  She had subsequent right arm pain.  She denies any chest pain or palpitations.  No nausea or vomiting.  She has been having headache with no paresthesias or focal muscle weakness.  She admitted to recent lightheadedness and dizzy spells with presyncope.  She has a history of porcine aortic valve replacement about 20 years ago.  No cough or wheezing or fever or chills.  No recent sick exposures.  Upon presentation to the emergency room, blood pressure was 194/99 with otherwise normal vital signs.  Labs revealed a BUN of 31 and creatinine 1.26 and high-sensitivity troponin of 16 and later 14, anemia close to baseline.  COVID-19 test is currently pending.  Portable chest ray showed emphysema, mild cardiomegaly with no acute cardiopulmonary disease.  Right shoulder x-ray showed spiral fracture in the midshaft of the right humerus just below her shoulder prosthesis.  Head CT scan revealed chronic atrophic and ischemic changes without acute abnormalities.  EKG showed normal sinus rhythm with rate of 68 with T wave inversion inferiorly.  The patient was given 25 mcg of IV fentanyl and DuoNeb.  She will be admitted to an observation telemetry bed for further evaluation and management.   PAST  MEDICAL HISTORY:   Past Medical History:  Diagnosis Date  . Arthritis   . CHF (congestive heart failure) ([place])   . COPD (chronic obstructive pulmonary disease) ([place])   . Coronary artery disease   . Hypercholesteremia   . Hypertension     PAST SURGICAL HISTORY:   Past Surgical History:  Procedure Laterality Date  . AORTIC VALVE REPLACEMENT  2006  . DILATION AND CURETTAGE OF UTERUS  1988  . SHOULDER SURGERY  09/1999  . SIGMOIDOSCOPY  1992    SOCIAL HISTORY:   Social History   Tobacco Use  . Smoking status: Never Smoker  . Smokeless tobacco: Never Used  Substance Use Topics  . Alcohol use: No    FAMILY HISTORY:   Family History  Problem Relation Age of Onset  . Aneurysm Father   . Lung cancer Brother   . Prostate cancer Brother   . Heart failure Brother     DRUG ALLERGIES:   Allergies  Allergen Reactions  . Iodine Anaphylaxis  . Shellfish Allergy Anaphylaxis  . Azithromycin Hives  . Sulfa Antibiotics Hives    REVIEW OF SYSTEMS:   ROS As per history of present illness. All pertinent systems were reviewed above. Constitutional,  HEENT, cardiovascular, respiratory, GI, GU, musculoskeletal, neuro, psychiatric, endocrine,  integumentary and hematologic systems were reviewed and are otherwise  negative/unremarkable except for positive findings mentioned above in the HPI.   MEDICATIONS AT HOME:   Prior to Admission medications   Medication Sig Start Date End Date Taking? Authorizing Provider  acetaminophen (TYLENOL) 500  MG tablet Take 500-1,000 mg by mouth 2 (two) times daily as needed for mild pain, moderate pain, fever or headache.    Yes [provider]  albuterol (PROVENTIL) (2.5 MG/3ML) 0.083% nebulizer solution Take 2.5 mg by nebulization every 6 (six) hours as needed for wheezing or shortness of breath.   Yes [provider]  allopurinol (ZYLOPRIM) 100 MG tablet TAKE 1 TABLET BY MOUTH ONCE DAILY FOR GOUT. 05/13/19  Yes Trinna Post, PA-C  aspirin EC 81 MG tablet Take 81 mg by mouth daily.   Yes [provider]  escitalopram (LEXAPRO) 20 MG tablet TAKE 1 TABLET BY MOUTH  DAILY 05/13/19  Yes Pollak, Adriana M, PA-C  Fluticasone-Salmeterol (ADVAIR DISKUS) 250-50 MCG/DOSE AEPB USE ONE (1) INHALATION BY MOUTH EVERY 12 HOURS. RINSE MOUTH OUT AFTERUSE. 12/26/17  Yes Chauvin, Herbie [place], PA  furosemide (LASIX) 20 MG tablet TAKE 1 TABLET BY MOUTH  DAILY 05/13/19  Yes Pollak, Adriana M, PA-C  ipratropium-albuterol (DUONEB) 0.5-2.5 (3) MG/3ML SOLN Take 3 mLs by nebulization 3 (three) times daily.   Yes [provider]  metoprolol succinate (TOPROL-XL) 50 MG 24 hr tablet TAKE 1 TABLET BY MOUTH  DAILY 10/29/18  Yes Carmon Ginsberg, PA  simvastatin (ZOCOR) 20 MG tablet TAKE 1 TABLET BY MOUTH AT  BEDTIME 05/13/19  Yes Pollak, Adriana M, PA-C  traZODone (DESYREL) 50 MG tablet TAKE ONE-HALF TABLET BY  MOUTH AT BEDTIME 07/01/19  Yes Pollak, Adriana M, PA-C  fluticasone (FLONASE) 50 MCG/ACT nasal spray Place into the nose. 12/08/17 12/08/18  [provider]      VITAL SIGNS:  Blood pressure (!) 168/56, pulse 66, temperature 98.1 F (36.7 C), temperature source Oral, resp. rate 15, height 5\' 1"  (1.549 m), weight 57.2 kg, SpO2 96 %.  PHYSICAL EXAMINATION:  Physical Exam  GENERAL:  85 y.o.-year-old Caucasian female patient lying in the bed with no acute distress.  EYES: Pupils equal, round, reactive to light and accommodation. No scleral icterus. Extraocular muscles intact.  HEENT: Head atraumatic, normocephalic. Oropharynx and nasopharynx clear.  NECK:  Supple, no jugular venous distention. No thyroid enlargement, no tenderness.  LUNGS: Normal breath sounds bilaterally, no wheezing, rales,rhonchi or crepitation. No use of accessory muscles of respiration.  CARDIOVASCULAR: Regular rate and rhythm, S1, S2 normal. No murmurs, rubs, or gallops.  ABDOMEN: Soft, nondistended, nontender. Bowel sounds present. No organomegaly or  mass.  EXTREMITIES: No pedal edema, cyanosis, or clubbing.  Right upper extremity in sling. NEUROLOGIC: Cranial nerves II through XII are intact. Muscle strength 5/5 in all extremities. Sensation intact. Gait not checked.  PSYCHIATRIC: The patient is alert and oriented x 3.  Normal affect and good eye contact. SKIN: Chin contusion with nasal abrasion as well as lower lip abrasions with good hemostasis.  LABORATORY PANEL:   CBC Recent Labs  Lab 07/19/19 1825  WBC 10.8*  HGB 11.5*  HCT 35.4*  PLT 184   ------------------------------------------------------------------------------------------------------------------  Chemistries  Recent Labs  Lab 07/19/19 1825  NA 139  K 4.1  CL 101  CO2 27  GLUCOSE 160*  BUN 31*  CREATININE 1.26*  CALCIUM 9.3   ------------------------------------------------------------------------------------------------------------------  Cardiac Enzymes No results for input(s): TROPONINI in the last 168 hours. ------------------------------------------------------------------------------------------------------------------  RADIOLOGY:  Dg Shoulder Right  Result Date: 07/19/2019 CLINICAL DATA:  Recent fall with right arm pain, initial encounter EXAM: RIGHT SHOULDER - 2+ VIEW COMPARISON:  None. FINDINGS: Right shoulder prosthesis is noted. There is a spiral fracture identified just distal to the tip of  the prosthesis with mild displacement identified. No definitive periprosthetic fracture is seen. IMPRESSION: Spiral fracture in the midshaft of the right humerus just below the known shoulder prosthesis Electronically Signed   By: Inez Catalina M.D.   On: 07/19/2019 19:05   Ct Head Wo Contrast  Result Date: 07/19/2019 CLINICAL DATA:  Recent falls EXAM: CT HEAD WITHOUT CONTRAST TECHNIQUE: Contiguous axial images were obtained from the base of the skull through the vertex without intravenous contrast. COMPARISON:  05/29/2017 FINDINGS: Brain: Chronic atrophic  and ischemic changes are noted similar to that seen on the prior exam. Bilateral basal ganglia calcifications are seen. No findings to suggest acute hemorrhage, acute infarction or space-occupying mass lesion noted. Vascular: No hyperdense vessel or unexpected calcification. Skull: Normal. Negative for fracture or focal lesion. Sinuses/Orbits: No acute finding. Other: None. IMPRESSION: Chronic atrophic and ischemic changes without acute abnormality. Electronically Signed   By: Inez Catalina M.D.   On: 07/19/2019 19:08   Dg Chest Portable 1 View  Result Date: 07/19/2019 CLINICAL DATA:  85 year old female with fall and syncope. EXAM: PORTABLE CHEST 1 VIEW COMPARISON:  Chest radiograph dated 11/27/2015 FINDINGS: Emphysematous changes of the lungs. No focal consolidation, pleural effusion, or pneumothorax. Diffuse interstitial prominence, likely chronic. Mild cardiomegaly. Median sternotomy wires and mechanical cardiac valve. Atherosclerotic calcification of the aorta. No acute osseous pathology. Osteopenia with degenerative changes of the spine and shoulders. IMPRESSION: 1. No acute cardiopulmonary process. 2. Emphysema. 3. Mild cardiomegaly. Electronically Signed   By: Anner Crete M.D.   On: 07/19/2019 22:59      IMPRESSION AND PLAN:   1.  Syncope with recent recurrent presyncope, subsequent fall leading to chin contusion, nasal and lip abrasions. The patient will be admitted to an observation telemetry monitored bed.  Will check orthostatics q 12 hours.  Will obtain a bilateral carotid Doppler and 2D echo.  The patient will be gently hydrated with IV normal saline and monitored for arrhythmias. Differential diagnosis would include neurally mediated syncope, cardiogenic, arrhythmias related,  orthostatic hypotension and less likely hypoglycemia.  2.  Hypertensive urgency.  We will continue Toprol-XL and place the patient on as needed IV labetalol.  3.  Right humerus fracture.  The patient is  currently in sling.  Will obtain an orthopedic consultation in a.m. for follow-up.  4.  Dyslipidemia.  Continue statin therapy.  5.  Depression.  We will continue Lexapro and trazodone.  6.  COPD.  We will continue duo nebs and hold Advair Diskus for now.  7.  DVT prophylaxis.  Subcutaneous Lovenox.    All the records are reviewed and case discussed with ED provider. The plan of care was discussed in details with the patient (and family). I answered all questions. The patient agreed to proceed with the above mentioned plan. Further management will depend upon hospital course.   CODE STATUS: Full code  TOTAL TIME TAKING CARE OF THIS PATIENT: 55 minutes.    Christel Mormon M.D on 07/20/2019 at 2:53 AM  Pager - [phone]  After 6pm go to [email or web address] - password [email or web address] [facility]  [facility]  Office  [phone]  CC: Primary care physician; Trinna Post, PA-C   Note: This dictation was prepared with Dragon dictation along with smaller phrase technology. Any transcriptional errors that result from this process are unintentional.

## 2019-07-20 NOTE — Consult Note (Addendum)
[facility] Cardiology  CARDIOLOGY CONSULT NOTE  Patient ID: Marilyn Oconnell MRN: [id number] DOB/AGE: 1924-02-08 85 y.o.  Admit date: 07/19/2019 Referring Physician Dr. Benjie Karvonen Primary Physician Dr. Margretta Sidle Primary Cardiologist Dr. Ubaldo Glassing Reason for Consultation Lightheadedness, recurrent pre-syncope  HPI:  Marilyn Oconnell is a 85 y.o. with a past medical history of coronary artery disease as seen on cardiac cath from 2006 with a 60-70% LAD lesion, aortic stenosis s/p Carpentier-Edwards 21 mm tissue valve replacement in 2006, paroxysmal atrial fibrillation, not on anticoagulation due to fall risk, HTN, HLD, and COPD, who presented to the ED yesterday following an unwitnessed fall. The patient remember walking in her kitchen without a cane or walker, and the next thing she remembers is the act of falling, and then hitting the ground face first. There was no loss of consciousness. She cannot recall having any particular dizziness or lightheadedness prior to the fall. She does report a long history of dizziness and lightheadedness which have contributes to some falls in the past. This dizziness is near constant. She cannot characterize the dizziness more than to say that it doesn't feel like room spinning. She has been evaluated by ENT and told that his not related to her inner ear. Last fall prior to yesterday was several months ago. She denies having any true syncope in over one year. She feels well currently aside from arm pain and facial pain from injuries after the fall.   ER work up revealed right humerus fracture. Her high sensitivity troponin was 14 then 16. EKG has non-specific T wave changes but no signs of ischemia. CT head was without acute abnormalities.   Review of systems complete and found to be negative unless listed above   Past Medical History:  Diagnosis Date  . Arthritis   . CHF (congestive heart failure) ([place])   . COPD (chronic obstructive pulmonary disease) ([place])   . Coronary artery  disease   . Hypercholesteremia   . Hypertension     Past Surgical History:  Procedure Laterality Date  . AORTIC VALVE REPLACEMENT  2006  . DILATION AND CURETTAGE OF UTERUS  1988  . SHOULDER SURGERY  09/1999  . SIGMOIDOSCOPY  1992    Medications Prior to Admission  Medication Sig Dispense Refill Last Dose  . acetaminophen (TYLENOL) 500 MG tablet Take 500-1,000 mg by mouth 2 (two) times daily as needed for mild pain, moderate pain, fever or headache.    prn at prn  . albuterol (PROVENTIL) (2.5 MG/3ML) 0.083% nebulizer solution Take 2.5 mg by nebulization every 6 (six) hours as needed for wheezing or shortness of breath.   prn at prn  . allopurinol (ZYLOPRIM) 100 MG tablet TAKE 1 TABLET BY MOUTH ONCE DAILY FOR GOUT. 90 tablet 3 Past Month at Unknown time  . aspirin EC 81 MG tablet Take 81 mg by mouth daily.   prn at prn  . escitalopram (LEXAPRO) 20 MG tablet TAKE 1 TABLET BY MOUTH  DAILY 90 tablet 3 Past Month at Unknown time  . Fluticasone-Salmeterol (ADVAIR DISKUS) 250-50 MCG/DOSE AEPB USE ONE (1) INHALATION BY MOUTH EVERY 12 HOURS. RINSE MOUTH OUT AFTERUSE. 180 each 3 Past Month at Unknown time  . furosemide (LASIX) 20 MG tablet TAKE 1 TABLET BY MOUTH  DAILY 90 tablet 3 Past Month at Unknown time  . ipratropium-albuterol (DUONEB) 0.5-2.5 (3) MG/3ML SOLN Take 3 mLs by nebulization 3 (three) times daily.   Past Month at Unknown time  . metoprolol succinate (TOPROL-XL) 50 MG 24 hr  tablet TAKE 1 TABLET BY MOUTH  DAILY 90 tablet 3 Past Month at Unknown time  . simvastatin (ZOCOR) 20 MG tablet TAKE 1 TABLET BY MOUTH AT  BEDTIME 90 tablet 3 Past Month at Unknown time  . traZODone (DESYREL) 50 MG tablet TAKE ONE-HALF TABLET BY  MOUTH AT BEDTIME 45 tablet 3 Past Month at Unknown time  . fluticasone (FLONASE) 50 MCG/ACT nasal spray Place into the nose.      Social History   Socioeconomic History  . Marital status: Widowed    Spouse name: Not on file  . Number of children: Not on file  . Years  of education: grade 8  . Highest education level: Not on file  Occupational History  . Not on file  Social Needs  . Financial resource strain: Not on file  . Food insecurity    Worry: Not on file    Inability: Not on file  . Transportation needs    Medical: Not on file    Non-medical: Not on file  Tobacco Use  . Smoking status: Never Smoker  . Smokeless tobacco: Never Used  Substance and Sexual Activity  . Alcohol use: No  . Drug use: No  . Sexual activity: Not on file  Lifestyle  . Physical activity    Days per week: Not on file    Minutes per session: Not on file  . Stress: Not on file  Relationships  . Social Herbalist on phone: Not on file    Gets together: Not on file    Attends religious service: Not on file    Active member of club or organization: Not on file    Attends meetings of clubs or organizations: Not on file    Relationship status: Not on file  . Intimate partner violence    Fear of current or ex partner: Not on file    Emotionally abused: Not on file    Physically abused: Not on file    Forced sexual activity: Not on file  Other Topics Concern  . Not on file  Social History Narrative  . Not on file    Family History  Problem Relation Age of Onset  . Aneurysm Father   . Lung cancer Brother   . Prostate cancer Brother   . Heart failure Brother     Review of systems complete and found to be negative unless listed above    PHYSICAL EXAM  General: Well developed, well nourished HEENT:  Multiple abrasions and ecchymosis chin and nose  Neck:  No JVD.  Lungs: Clear bilaterally to auscultation and percussion. Heart: HRRR . 3/6 systolic murmur heard across precordium.  Extremities: No clubbing, cyanosis or edema. Right arm in sling.  Neuro: Alert and oriented X 3. Psych:  Good affect, responds appropriately  Labs:   Lab Results  Component Value Date   WBC 10.4 07/20/2019   HGB 10.6 (L) 07/20/2019   HCT 32.1 (L) 07/20/2019   MCV  93.6 07/20/2019   PLT 165 07/20/2019    Recent Labs  Lab 07/20/19 0557  NA 139  K 4.0  CL 103  CO2 25  BUN 25*  CREATININE 1.08*  CALCIUM 9.1  GLUCOSE 140*   Lab Results  Component Value Date   TROPONINI 0.06 (H) 11/28/2015    Lab Results  Component Value Date   CHOL 166 07/14/2018   CHOL 168 01/15/2016   CHOL 182 10/11/2015   Lab Results  Component Value Date  HDL 62 07/14/2018   HDL 60 01/15/2016   HDL 67 10/11/2015   Lab Results  Component Value Date   LDLCALC 86 07/14/2018   LDLCALC 87 01/15/2016   LDLCALC 94 10/11/2015   Lab Results  Component Value Date   TRIG 88 07/14/2018   TRIG 104 01/15/2016   TRIG 105 10/11/2015   Lab Results  Component Value Date   CHOLHDL 2.7 07/14/2018   CHOLHDL 2.7 10/11/2015   No results found for: LDLDIRECT    Radiology: Dg Shoulder Right  Result Date: 07/19/2019 CLINICAL DATA:  Recent fall with right arm pain, initial encounter EXAM: RIGHT SHOULDER - 2+ VIEW COMPARISON:  None. FINDINGS: Right shoulder prosthesis is noted. There is a spiral fracture identified just distal to the tip of the prosthesis with mild displacement identified. No definitive periprosthetic fracture is seen. IMPRESSION: Spiral fracture in the midshaft of the right humerus just below the known shoulder prosthesis Electronically Signed   By: Inez Catalina M.D.   On: 07/19/2019 19:05   Ct Head Wo Contrast  Result Date: 07/19/2019 CLINICAL DATA:  Recent falls EXAM: CT HEAD WITHOUT CONTRAST TECHNIQUE: Contiguous axial images were obtained from the base of the skull through the vertex without intravenous contrast. COMPARISON:  05/29/2017 FINDINGS: Brain: Chronic atrophic and ischemic changes are noted similar to that seen on the prior exam. Bilateral basal ganglia calcifications are seen. No findings to suggest acute hemorrhage, acute infarction or space-occupying mass lesion noted. Vascular: No hyperdense vessel or unexpected calcification. Skull: Normal.  Negative for fracture or focal lesion. Sinuses/Orbits: No acute finding. Other: None. IMPRESSION: Chronic atrophic and ischemic changes without acute abnormality. Electronically Signed   By: Inez Catalina M.D.   On: 07/19/2019 19:08   Dg Chest Portable 1 View  Result Date: 07/19/2019 CLINICAL DATA:  85 year old female with fall and syncope. EXAM: PORTABLE CHEST 1 VIEW COMPARISON:  Chest radiograph dated 11/27/2015 FINDINGS: Emphysematous changes of the lungs. No focal consolidation, pleural effusion, or pneumothorax. Diffuse interstitial prominence, likely chronic. Mild cardiomegaly. Median sternotomy wires and mechanical cardiac valve. Atherosclerotic calcification of the aorta. No acute osseous pathology. Osteopenia with degenerative changes of the spine and shoulders. IMPRESSION: 1. No acute cardiopulmonary process. 2. Emphysema. 3. Mild cardiomegaly. Electronically Signed   By: Anner Crete M.D.   On: 07/19/2019 22:59   EKG: Sinus rhythm, Rate of 68 BPM, normal axis, normal intervals, T wave inversion in lead III, not contiguous, no ST changes   ASSESSMENT AND PLAN:  Ms. Filla is a 85 year old female with a history significant for aortic valve stenosis s/p bioprosthetic replacement in 2006 at [facility], paroxysmal atrial fibrillation, not on anticoagulation due to fall risk, coronary artery disease as seen on cardiac cath from 2006 with 60% lesion to LAD, HTN, and HLD, who presented to the ED yesterday following an unwitnessed fall resulting in right humerus fracture. Patient concerned about her recent recurrent pre-syncope symptoms and falls. She was admitted for syncope workup. Review of telemetry shows she is in sinus rhythm with normal rate. No evidence of arrhythmia, malignant tachycardia, or bradycardia on telemetry. Her high sensitivity troponins were 14 > 16. EKG unremarkable for ischemia. Patient feels well currently aside from arm and facial pain from her injuries. No chest pain. Low suspicion  for ACS as cause of her symptoms. Labs without significant anemia. No orthostatic hypotension during last two checks. Her vitals are notable for significant hypertension. Pre-syncope/syncope etiology unclear.   1. Agree with plan for ECHO and  carotid doppler for syncope work up  2. Continue monitoring on telemetry  3. Her vitals are notable for significant hypertension. She is not on antihypertensive therapy in the outpatient setting but would consider initiation of antihypertensives for improved BP control 4. Consider holter monitor in the outpatient setting for further evaluation of arrhythmia as cause of her symptoms. ] 5. No other cardiac diagnostics or interventions indicated at this time    The patient's history and exam findings were discussed with Dr. Nehemiah Massed. The plan was made in conjunction with Dr. Nehemiah Massed.  Signed: Hilbert Odor PA-C 07/20/2019, 7:25 AM   The patient has been interviewed and examined. I agree with assessment and plan above. Serafina Royals MD [facility]

## 2019-07-20 NOTE — Progress Notes (Signed)
*  PRELIMINARY RESULTS* Echocardiogram 2D Echocardiogram has been performed.  Sherrie Sport 07/20/2019, 9:53 AM

## 2019-07-20 NOTE — TOC Initial Note (Signed)
Transition of Care [facility]) - Initial/Assessment Note    Patient Details  Name: Marilyn Oconnell MRN: [id number] Date of Birth: Sep 08, 1924  [facility]) CM/SW Contact:    Candie Chroman, LCSW Phone Number: 07/20/2019, 4:26 PM  Clinical Narrative:  CSW met with patient. Niece at bedside. CSW introduced role and explained that PT recommendations would be discussed. Patient's niece agreeable to HHPT. She was just discharged from [facility] 2-3 months ago and they would like to resume services with them. [facility] is aware and agreeable. Patient's niece has arranged for 24/7 care for the first few weeks after she is discharged home. Patient has all the DME and home modifications she needs at home. No further concerns. CSW encouraged patient and her niece to contact CSW as needed. CSW will continue to follow patient and her niece for support and facilitate return home when stable.                Expected Discharge Plan: [place] Barriers to Discharge: Continued Medical Work up   Patient Goals and CMS Choice     Choice offered to / list presented to : NA  Expected Discharge Plan and Services Expected Discharge Plan: [place] Acute Care Choice: [place] arrangements for the past 2 months: [place]: PT [place]: [place]) Date [place]: 07/20/19   Representative spoke with at [place]: Floydene Flock  Prior Living Arrangements/Services Living arrangements for the past 2 months: [place] Housing Lives with:: Self Patient language and need for interpreter reviewed:: Yes Do you feel safe going back to the place where you live?: Yes      Need for Family Participation in Patient Care: Yes (Comment) Care giver support system in place?: Yes (comment) Current home services: DME Criminal Activity/Legal Involvement Pertinent to Current  Situation/Hospitalization: No - Comment as needed  Activities of Daily Living Home Assistive Devices/Equipment: Scales, Cane (specify quad or straight), Grab bars in shower, Grab bars around toilet, Shower chair with back ADL Screening (condition at time of admission) Patient's cognitive ability adequate to safely complete daily activities?: Yes Is the patient deaf or have difficulty hearing?: No Does the patient have difficulty seeing, even when wearing glasses/contacts?: Yes Does the patient have difficulty concentrating, remembering, or making decisions?: No Patient able to express need for assistance with ADLs?: Yes Does the patient have difficulty dressing or bathing?: No Independently performs ADLs?: No Communication: Independent Dressing (OT): Needs assistance Is this a change from baseline?: Pre-admission baseline Grooming: Needs assistance Is this a change from baseline?: Pre-admission baseline Feeding: Independent Bathing: Needs assistance Is this a change from baseline?: Pre-admission baseline Toileting: Needs assistance Is this a change from baseline?: Pre-admission baseline In/Out Bed: Needs assistance Is this a change from baseline?: Pre-admission baseline Walks in Home: Independent Does the patient have difficulty walking or climbing stairs?: No Weakness of Legs: None Weakness of Arms/Hands: Right  Permission Sought/Granted Permission sought to share information with : Facility Sport and exercise psychologist, Family Supports    Share Information with NAME: Oren Section  Permission granted to share info w AGENCY: [place] granted to share info w Relationship: Niece  Permission granted to share info w Contact Information: [phone]  Emotional Assessment  Appearance:: Appears stated age Attitude/Demeanor/Rapport: Engaged Affect (typically observed): Appropriate, Calm, Pleasant Orientation: : Oriented to Self, Oriented to Place, Oriented to  Time,  Oriented to Situation Alcohol / Substance Use: Never Used Psych Involvement: No (comment)  Admission diagnosis:  Syncope and collapse [R55] Closed displaced spiral fracture of shaft of right humerus, initial encounter [Y97.044P] Patient Active Problem List   Diagnosis Date Noted  . Syncope 07/20/2019  . Recurrent syncope 07/19/2019  . Insomnia 03/18/2016  . Visual hallucinations 03/18/2016  . Vertigo 03/15/2016  . Paroxysmal atrial fibrillation with RVR ([place]) 11/27/2015  . Depression 11/27/2015  . Adaptation reaction 09/28/2015  . Aortic heart valve narrowing 09/28/2015  . Bilateral cataracts 09/28/2015  . Cardiac murmur 09/28/2015  . Chronic kidney disease (CKD), stage III (moderate) 09/28/2015  . History of gout 09/28/2015  . 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/2015  . OP (osteoporosis) 09/28/2015  . Atherosclerosis of coronary artery 09/08/2015  . Avitaminosis D 07/11/2015  . LBP (low back pain) 07/11/2015  . BP (high blood pressure) 07/11/2015  . Hyperlipemia 04/12/2015  . Billowing mitral valve 08/03/2014  . Fibrosis of lung ([place]) 05/25/2014  . Asthma-chronic obstructive pulmonary disease overlap syndrome ([place]) 05/25/2014  . H/O aortic valve replacement 11/09/2004   PCP:  Trinna Post, PA-C Pharmacy:   [place], [place] - [address] [place] [address] Phone: [phone] Fax: [phone]  [place], [place] [address] [place] [address] Phone: [phone] Fax: [phone]     Social Determinants of Health (SDOH) Interventions    Readmission Risk Interventions No flowsheet data found.

## 2019-07-20 NOTE — Progress Notes (Signed)
Pt reports she takes duoneb a home TID. MD notified via text and orders received. I will continue to assess.

## 2019-07-20 NOTE — Plan of Care (Signed)
  Problem: Education: Goal: Knowledge of General Education information will improve Description: Including pain rating scale, medication(s)/side effects and non-pharmacologic comfort measures Outcome: Progressing Note: Patient profile completed via Niece Michelle's assistance. Patient is hard of hearing. Patient is legally blind. Patient arrived in pain. Administered morphine for pain.  Patient's arm is in a sling not a splint. Will continue to monitor.

## 2019-07-20 NOTE — Progress Notes (Addendum)
Ch visited pt who requested prayer. Pt is a 85 y.o. female that is HOH and blind that came in due to a fall. Pt is very lucid and a/o but R arm is in a sling. Ch was present while pt was being evaluated by ortho provider that shared pt is will not be having surgery for the broken humerus. Pt and niece that was bedside understood but there main concern is the [facility] for the pt. Niece Selinda Eon) shared that they are hopeful for the pt to be at home and not in a SNF. Pt does want to receive PT/OT and nurse from [facility]. Ch informed both pt and niece of the possible equipment that would be a benefit for the pt to have such as a hoyer lift/hospital bed for easy transfers. The writer wonders if the pt and family would be open to rehab considering the pt's condition for the purpose of have enough time to get the needed equipment and staffing in place for the pt to safely transition home. Ch read Ps. 71 to pt and prayed with niece at bedside. Visit was appreciated. Care provided: grief and emotional distress support; facilitate spiritual support; prayer; explore quality of life  Goals: f/u with pt and niece; communicate with the care team regarding the GOC/TOC for pt     07/20/19 1200  Clinical Encounter Type  Visited With Health care provider;Patient and family together  Visit Type Psychological support;Spiritual support;Social support  Referral From Nurse  Consult/Referral To Chaplain  Spiritual Encounters  Spiritual Needs Prayer;Emotional;Grief support;Sacred text  Stress Factors  Patient Stress Factors Exhausted;Health changes;Major life changes  Family Stress Factors Major life changes;Loss of control;Lack of caregivers

## 2019-07-21 ENCOUNTER — Inpatient Hospital Stay: Payer: Medicare Other

## 2019-07-21 LAB — T4: T4, Total: 7.4 ug/dL (ref 4.5–12.0)

## 2019-07-21 IMAGING — DX DG HUMERUS 2V *R*
3 series · 3 of 3 positions shown · non-contrast
Comparison: [DATE].

CLINICAL DATA: Right humeral fracture after fall.

EXAM:
RIGHT HUMERUS - 2+ VIEW

[humerus ap (1 of 2)]
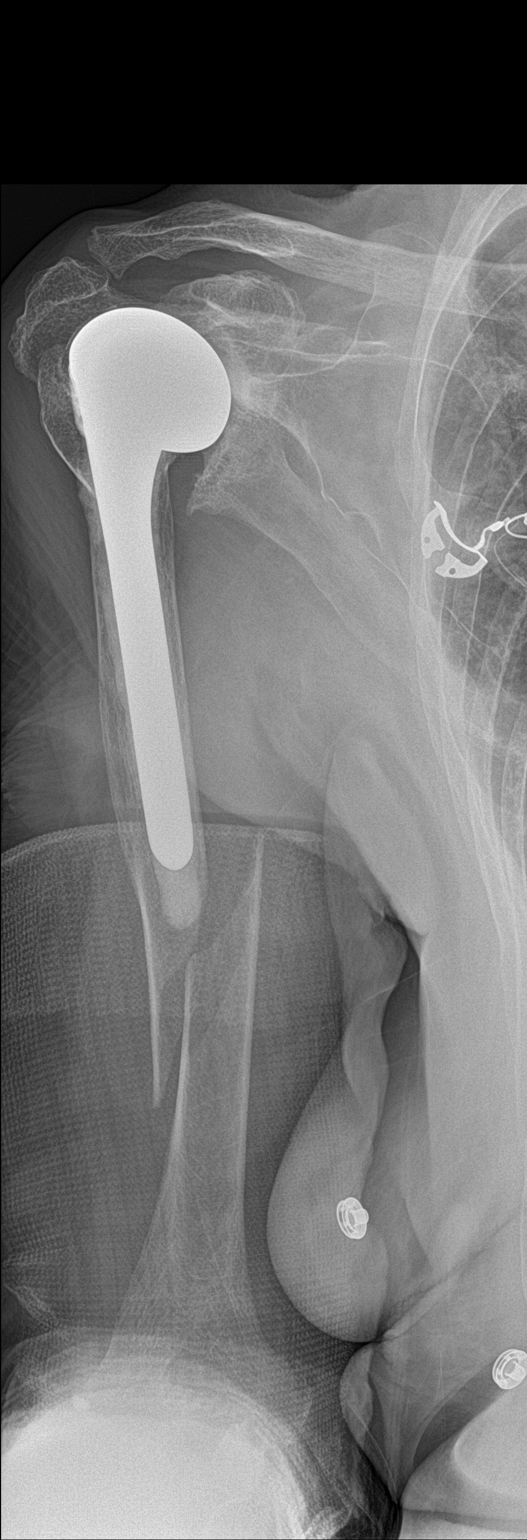

[humerus lat]
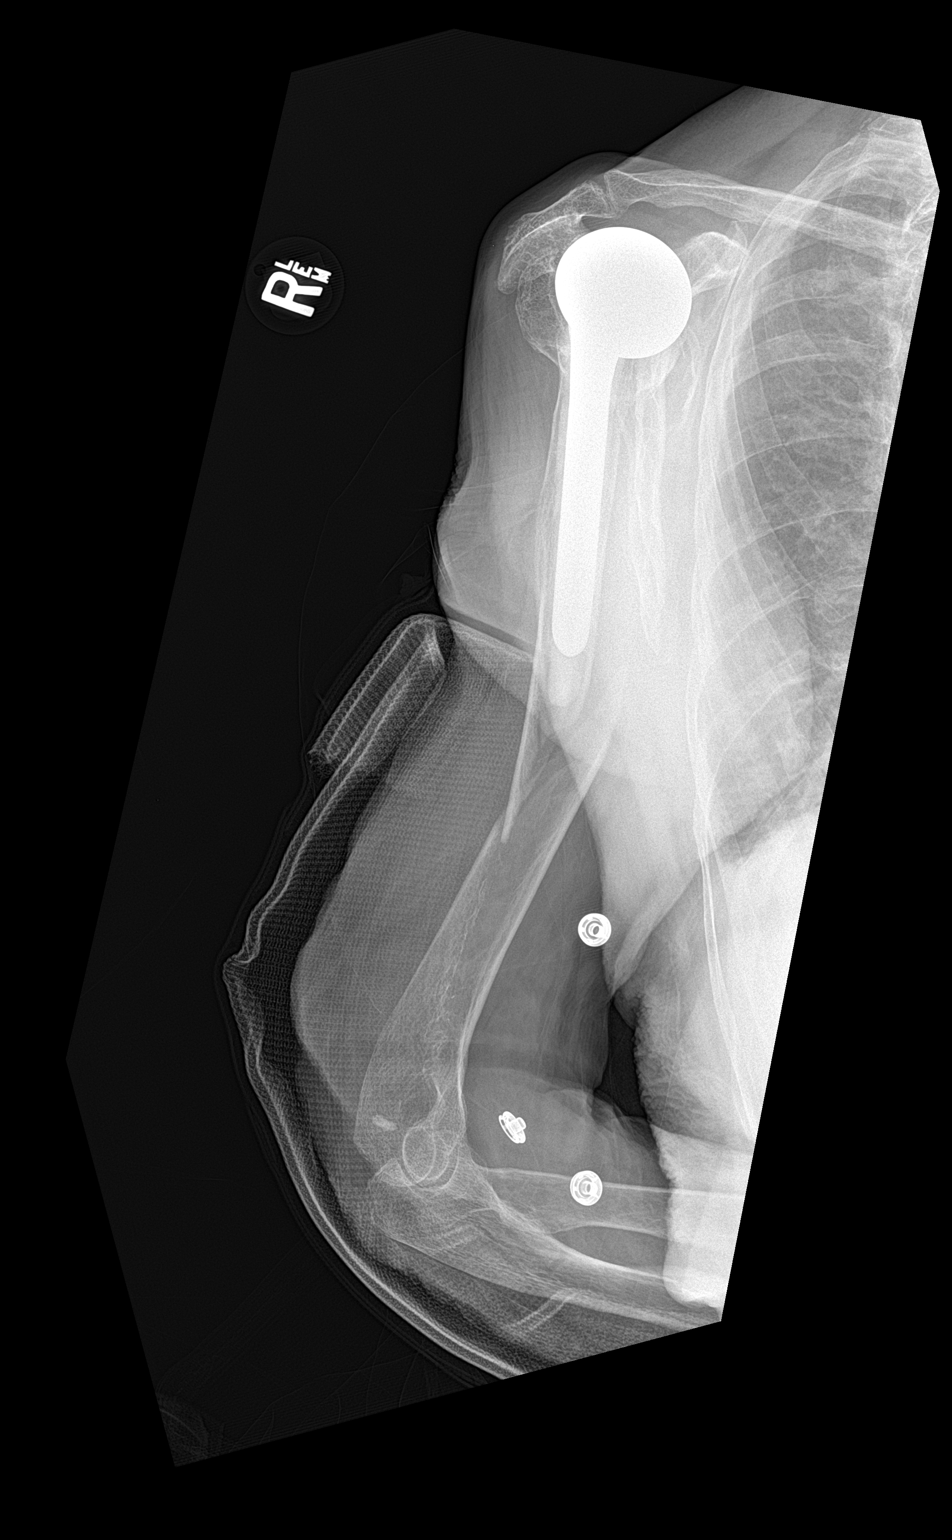

[humerus ap (2 of 2)]
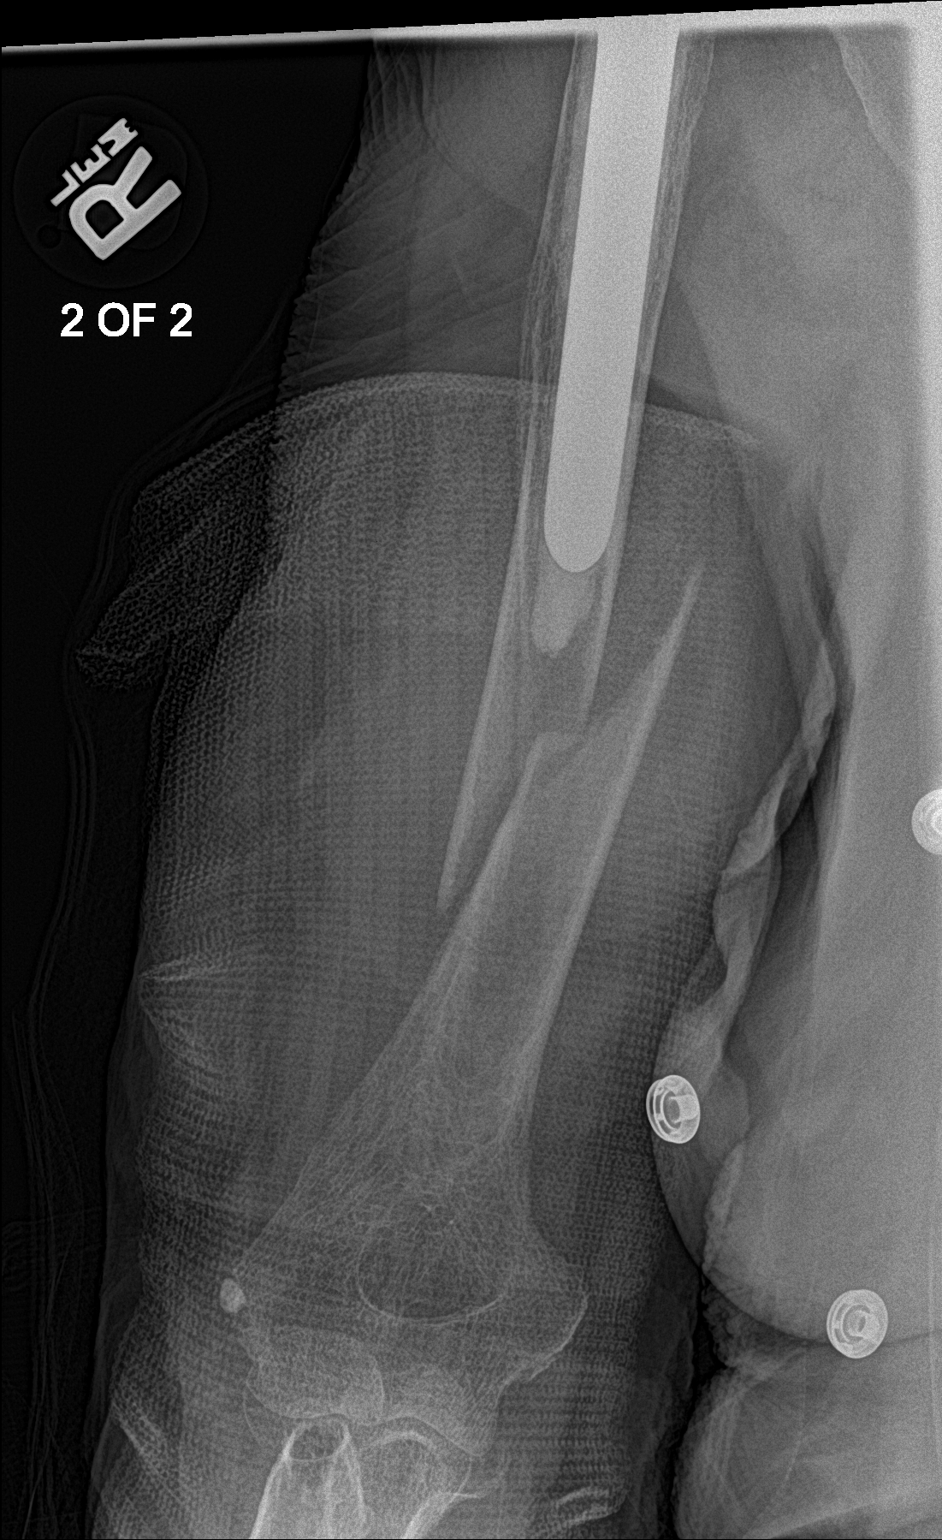

[3 of 3 positions shown; findings below may reference images not displayed]

FINDINGS: Moderately displaced oblique fracture is seen involving the right
humeral shaft distal to the humeral prosthesis.
IMPRESSION: Right humeral shaft fracture is again noted which demonstrates more
angulation and displacement compared to prior exam.

## 2019-07-21 MED ORDER — OXYCODONE-ACETAMINOPHEN 5-325 MG PO TABS: 1.0000 | ORAL_TABLET | ORAL | 0 refills | Status: DC | PRN

## 2019-07-21 NOTE — Progress Notes (Signed)
[facility] Cardiology  SUBJECTIVE:  Marilyn Oconnell is a 85 y.o. with a past medical history of coronary artery disease as seen on cardiac cath from 2006 with a 60-70% LAD lesion, aortic stenosis s/p Carpentier-Edwards 21 mm tissue valve replacement in 2006, paroxysmal atrial fibrillation, not on anticoagulation due to fall risk, HTN, HLD, and COPD, who presented to the ED on 10/26 following an unwitnessed fall. Event thought to be secondary to syncope. Pt with a long history of lightheadedness and falls from pre-syncope or syncopal events. ECHO obtained yesterday shows worsening stenosis of her known bioprosthetic aortic valve, which may be contributing to her symptoms.   She reports having significant arm pain last night, however, she is feeling better now. She has no dizziness or lightheadedness while lying in bed. She has not been up to ambulate on her own yet. She has no other new symptoms or concerns.    Vitals:   07/20/19 1600 07/20/19 1943 07/20/19 1954 07/21/19 0557  BP: (!) 157/83  (!) 166/53 (!) 157/67  Pulse: 72  74 76  Resp:   20 20  Temp:   98 F (36.7 C) 98.2 F (36.8 C)  TempSrc:   Oral Oral  SpO2:  96% 97% 99%  Weight:      Height:         Intake/Output Summary (Last 24 hours) at 07/21/2019 0720 Last data filed at 07/20/2019 1100 Gross per 24 hour  Intake -  Output 500 ml  Net -500 ml      PHYSICAL EXAM  General: Well developed, well nourished HEENT:  Multiple abrasions and ecchymosis chin and nose  Neck:   No JVD.  Lungs: Clear bilaterally to auscultation and percussion. Heart: HRRR . 3/6 systolic murmur heard across precordium.  Extremities: No clubbing, cyanosis or edema. Right arm in sling.  Neuro: Alert and oriented X 3. Psych:  Good affect, responds appropriately  LABS: Basic Metabolic Panel: Recent Labs    07/19/19 1825 07/20/19 0557  NA 139 139  K 4.1 4.0  CL 101 103  CO2 27 25  GLUCOSE 160* 140*  BUN 31* 25*  CREATININE 1.26* 1.08*  CALCIUM  9.3 9.1   Liver Function Tests: No results for input(s): AST, ALT, ALKPHOS, BILITOT, PROT, ALBUMIN in the last 72 hours. No results for input(s): LIPASE, AMYLASE in the last 72 hours. CBC: Recent Labs    07/19/19 1825 07/20/19 0557  WBC 10.8* 10.4  HGB 11.5* 10.6*  HCT 35.4* 32.1*  MCV 95.2 93.6  PLT 184 165   Cardiac Enzymes: No results for input(s): CKTOTAL, CKMB, CKMBINDEX, TROPONINI in the last 72 hours. BNP: Invalid input(s): POCBNP D-Dimer: No results for input(s): DDIMER in the last 72 hours. Hemoglobin A1C: No results for input(s): HGBA1C in the last 72 hours. Fasting Lipid Panel: No results for input(s): CHOL, HDL, LDLCALC, TRIG, CHOLHDL, LDLDIRECT in the last 72 hours. Thyroid Function Tests: Recent Labs    07/19/19 1026 07/20/19 0041  TSH  --  7.564*  T4TOTAL 7.4  --    Anemia Panel: No results for input(s): VITAMINB12, FOLATE, FERRITIN, TIBC, IRON, RETICCTPCT in the last 72 hours.  Dg Shoulder Right  Result Date: 07/19/2019 CLINICAL DATA:  Recent fall with right arm pain, initial encounter EXAM: RIGHT SHOULDER - 2+ VIEW COMPARISON:  None. FINDINGS: Right shoulder prosthesis is noted. There is a spiral fracture identified just distal to the tip of the prosthesis with mild displacement identified. No definitive periprosthetic fracture is seen. IMPRESSION: Spiral  fracture in the midshaft of the right humerus just below the known shoulder prosthesis Electronically Signed   By: Inez Catalina M.D.   On: 07/19/2019 19:05   Ct Head Wo Contrast  Result Date: 07/19/2019 CLINICAL DATA:  Recent falls EXAM: CT HEAD WITHOUT CONTRAST TECHNIQUE: Contiguous axial images were obtained from the base of the skull through the vertex without intravenous contrast. COMPARISON:  05/29/2017 FINDINGS: Brain: Chronic atrophic and ischemic changes are noted similar to that seen on the prior exam. Bilateral basal ganglia calcifications are seen. No findings to suggest acute hemorrhage,  acute infarction or space-occupying mass lesion noted. Vascular: No hyperdense vessel or unexpected calcification. Skull: Normal. Negative for fracture or focal lesion. Sinuses/Orbits: No acute finding. Other: None. IMPRESSION: Chronic atrophic and ischemic changes without acute abnormality. Electronically Signed   By: Inez Catalina M.D.   On: 07/19/2019 19:08   US Carotid Bilateral  Result Date: 07/20/2019 CLINICAL DATA:  85 year old female with a history of syncope EXAM: BILATERAL CAROTID DUPLEX ULTRASOUND TECHNIQUE: Pearline Cables scale imaging, color Doppler and duplex ultrasound were performed of bilateral carotid and vertebral arteries in the neck. COMPARISON:  05/29/2017 FINDINGS: Criteria: Quantification of carotid stenosis is based on velocity parameters that correlate the residual internal carotid diameter with NASCET-based stenosis levels, using the diameter of the distal internal carotid lumen as the denominator for stenosis measurement. The following velocity measurements were obtained: RIGHT ICA:  Systolic 643 cm/sec, Diastolic 22 cm/sec CCA:  83 cm/sec SYSTOLIC ICA/CCA RATIO:  1.3 ECA:  65 cm/sec LEFT ICA:  Systolic 329 cm/sec, Diastolic 17 cm/sec CCA:  92 cm/sec SYSTOLIC ICA/CCA RATIO:  1.2 ECA:  71 cm/sec Right Brachial SBP: Not acquired Left Brachial SBP: Not acquired RIGHT CAROTID ARTERY: No significant calcified disease of the right common carotid artery. Intermediate waveform maintained. Heterogeneous plaque without significant calcifications at the right carotid bifurcation. Low resistance waveform of the right ICA. No significant tortuosity. RIGHT VERTEBRAL ARTERY: Antegrade flow with low resistance waveform. LEFT CAROTID ARTERY: No significant calcified disease of the left common carotid artery. Intermediate waveform maintained. Heterogeneous plaque at the left carotid bifurcation without significant calcifications. Low resistance waveform of the left ICA. LEFT VERTEBRAL ARTERY:  Antegrade flow  with low resistance waveform. IMPRESSION: Color duplex indicates minimal heterogeneous plaque, with no hemodynamically significant stenosis by duplex criteria in the extracranial cerebrovascular circulation. Signed, Dulcy Fanny. Dellia Nims, RPVI [facility] Radiology Electronically Signed   By: Corrie Mckusick D.O.   On: 07/20/2019 09:46   Dg Chest Portable 1 View  Result Date: 07/19/2019 CLINICAL DATA:  85 year old female with fall and syncope. EXAM: PORTABLE CHEST 1 VIEW COMPARISON:  Chest radiograph dated 11/27/2015 FINDINGS: Emphysematous changes of the lungs. No focal consolidation, pleural effusion, or pneumothorax. Diffuse interstitial prominence, likely chronic. Mild cardiomegaly. Median sternotomy wires and mechanical cardiac valve. Atherosclerotic calcification of the aorta. No acute osseous pathology. Osteopenia with degenerative changes of the spine and shoulders. IMPRESSION: 1. No acute cardiopulmonary process. 2. Emphysema. 3. Mild cardiomegaly. Electronically Signed   By: Anner Crete M.D.   On: 07/19/2019 22:59    Echo - Normal LV function, normal EF, moderate to severe stenosis of her bioprosthetic aortic valve.   TELEMETRY: Sinus rhythm, Rate of 70s  ASSESSMENT AND PLAN:  Ms. Seguin is a 85 year old female with a history significant for aortic valve stenosis s/p bioprosthetic replacement in 2006 at [facility], paroxysmal atrial fibrillation, not on anticoagulation due to fall risk, coronary artery disease as  seen on cardiac cath from 2006 with 60% lesion to LAD, HTN, and HLD, who presented to the ED yesterday following an unwitnessed fall resulting in right humerus fracture. Patient concerned about her recent recurrent pre-syncope symptoms and falls. She was admitted for syncope workup. Review of telemetry shows she is in sinus rhythm with normal rate. No evidence of arrhythmia, malignant tachycardia, or bradycardia on telemetry. Her high  sensitivity troponins were 14 > 16. EKG unremarkable for ischemia. Patient feels well currently aside from arm and facial pain from her injuries. No chest pain. Low suspicion for ACS as cause of her symptoms. Labs without significant anemia. No orthostatic hypotension during last two checks. Her vitals are notable for significant hypertension. Pre-syncope/syncope etiology unclear, although ECHO with worsened stenosis of her bioprosthetic aortic valve which may be contributing to her symptoms. No significant stenosis seen on carotid doppler ultrasound.   Active Problems:   Recurrent syncope   Syncope  1. Moderate to severe aortic valve stenosis on ECHO - no plans for urgent interventions at this time. Will discuss risks and benefit of possible interventions in the outpatient setting. 2. Continue monitoring on telemetry  3. She continues to be hypertensive. Continue with BP control.  4. Consider holter monitor in the outpatient setting for further evaluation of arrhythmia as cause of her symptoms.  5. No other cardiac diagnostics or interventions indicated at this time    Hilbert Odor, PA-C 07/21/2019 7:20 AM

## 2019-07-21 NOTE — Progress Notes (Addendum)
Physical Therapy Treatment Patient Details Name: Marilyn Oconnell MRN: [id number] DOB: 1924-03-17 Today's Date: 07/21/2019    History of Present Illness Per MD: PT is a 85 y.o. Caucasian female with a known history of CHF, COPD, coronary artery disease, hypertension and dyslipidemia, presented to the emergency room with acute onset of recurrent presyncope with likely syncopal episode today when she fell face forward and hit her shin subsequent contusion as well as nasal and lip abrasions.  She had subsequent right arm pain.  She has been having headache and she admitted to recent lightheadedness and dizzy spells with presyncope.  MD assessment includes: Presyncope with minor contusion to chin/lip, minimally displaced spiral fracture just distal to a shoulder prosthesis. Patient may not require surgery if the fracture maintains its current position (She will need outpatient follow up in 1 week with Dr Octavio Manns), AKI - This improved with IVF, HTN urgency - This has improved with pain control, Depression/Anxiety - Continue Lexapro, COPD without signs of exacerbation    PT Comments    Pt presented with deficits in strength, transfers, mobility, gait, balance, and activity tolerance. Pt required mod A to get to EOB, and min A to get safely seated on BSC and recliner. Pt completed 6 sit<>stand transfer throughout session and required CGA. Pt ambulated and tried using of the hemi-walker, but found it be heavy and kept running her feet into it, and prefers to continue using SPC. Pt completed stairs training with slow step-through pattern, good concentric and eccentric control, and mod verbal and tactile cuing to guide her to the rails and edge of steps 2/2 legal blindness. Pt will benefit from HHPT services upon discharge to safely address above deficits for decreased caregiver assistance and eventual return to PLOF.    Follow Up Recommendations  Home health PT;Supervision for mobility/OOB     Equipment  Recommendations  None recommended by PT    Recommendations for Other Services       Precautions / Restrictions Precautions Precaution Comments: High fall risk Restrictions Weight Bearing Restrictions: Yes RUE Weight Bearing: Non weight bearing Other Position/Activity Restrictions: Per MD: No RUE elevation, abduction, weightbearing, or lifting    Mobility  Bed Mobility Overal bed mobility: Needs Assistance Bed Mobility: Rolling;Supine to Sit;Sit to Supine Rolling: Min assist   Supine to sit: Mod assist Sit to supine: Min assist   General bed mobility comments: Pt completed bed mobility with mod A to get to EOB, but managed sitting in recliner with min cuing.  Transfers Overall transfer level: Needs assistance Equipment used: Straight cane Transfers: Sit to/from Stand Sit to Stand: Min guard         General transfer comment: Pt required cuing to setup for sit<>stand transfers and maintain stability.  Ambulation/Gait Ambulation/Gait assistance: Min assist Gait Distance (Feet): 50 Feet Assistive device: Straight cane;Hemi-walker   Gait velocity: Decreased   General Gait Details: SPC was shaky in L hand at times, and used it well to probe ahead for stairs. Pt trialed use of hemi-walker which pt found heavy and hard to manage.   Stairs Stairs: Yes Stairs assistance: Min assist Stair Management: Two rails Number of Stairs: 4 General stair comments: Pt ascended steps with step-through gait, using LUE for support, and good concentric control. Pt descended slowly with mod cuing and fair eccentric control, and no buckling or LOB.   Wheelchair Mobility    Modified Rankin (Stroke Patients Only)       Balance Overall balance assessment: Needs assistance Sitting-balance  support: Single extremity supported;Feet unsupported Sitting balance-Leahy Scale: Good     Standing balance support: Single extremity supported;During functional activity Standing balance-Leahy  Scale: Fair Standing balance comment: Pt exhibited small wobble while standing supported by [facility].                            Cognition Arousal/Alertness: Awake/alert Behavior During Therapy: WFL for tasks assessed/performed Overall Cognitive Status: Within Functional Limits for tasks assessed                                        Exercises Total Joint Exercises Ankle Circles/Pumps: AROM;Strengthening;Both;10 reps;15 reps Heel Slides: AROM;Strengthening;Both;5 reps Hip ABduction/ADduction: AROM;Strengthening;Both;5 reps Marching in Standing: AROM;Strengthening;Standing;Both;10 reps Other Exercises Other Exercises: Pt in static and dynamic sitting at EOB for 10 mins, at least 6 sit<>stand transfers, with good control and only minor pain in RUE.    General Comments        Pertinent Vitals/Pain Pain Assessment: 0-10 Pain Score: 6  Pain Location: R arm and shoulder, also her lip and chin Pain Descriptors / Indicators: Aching;Contraction;Sharp;Throbbing Pain Intervention(s): Limited activity within patient's tolerance;Monitored during session    Home Living                      Prior Function            PT Goals (current goals can now be found in the care plan section) Progress towards PT goals: Progressing toward goals    Frequency    7X/week      PT Plan Current plan remains appropriate    Co-evaluation              AM-PAC PT "6 Clicks" Mobility   Outcome Measure  Help needed turning from your back to your side while in a flat bed without using bedrails?: A Little Help needed moving from lying on your back to sitting on the side of a flat bed without using bedrails?: A Lot Help needed moving to and from a bed to a chair (including a wheelchair)?: A Little Help needed standing up from a chair using your arms (e.g., wheelchair or bedside chair)?: A Little Help needed to walk in hospital room?: A Little Help needed  climbing 3-5 steps with a railing? : A Little 6 Click Score: 17    End of Session Equipment Utilized During Treatment: Gait belt Activity Tolerance: Patient limited by fatigue Patient left: in chair;with family/visitor present Nurse Communication: Mobility status PT Visit Diagnosis: Unsteadiness on feet (R26.81);Muscle weakness (generalized) (M62.81);Difficulty in walking, not elsewhere classified (R26.2)     Time: 2426-8341 PT Time Calculation (min) (ACUTE ONLY): 47 min  Charges:                        Juanda Crumble "Gus" Jeannette Corpus, SPT  07/21/19, 4:31 PM

## 2019-07-21 NOTE — TOC Transition Note (Signed)
Transition of Care [facility]) - CM/SW Discharge Note   Patient Details  Name: Marilyn Oconnell MRN: [id number] Date of Birth: 06-Mar-1924  Transition of Care [facility]) CM/SW Contact:  Candie Chroman, LCSW Phone Number: 07/21/2019, 12:05 PM   Clinical Narrative: Patient has orders to discharge home today. [facility] is aware. No further concerns. CSW signing off.  Final next level of care: Home w Home Health Services Barriers to Discharge: Barriers Resolved   Patient Goals and CMS Choice     Choice offered to / list presented to : NA  Discharge Placement                Patient to be transferred to facility by: Niece will pick her up.   Patient and family notified of of transfer: 07/21/19  Discharge Plan and Services     Post Acute Care Choice: Home Health                    HH Arranged: RN, PT, OT, Nurse's Aide, Social Work [employer] Agency: [place] [facility] Date [place]: 07/21/19   Representative spoke with at [place]: Floydene Flock  Social Determinants of Health (SDOH) Interventions     Readmission Risk Interventions No flowsheet data found.

## 2019-07-21 NOTE — Discharge Summary (Addendum)
[place] at [place] NAME: Marilyn Oconnell    MR#:  [id number]  DATE OF BIRTH:  [birth year]  DATE OF ADMISSION:  07/19/2019 ADMITTING PHYSICIAN: Christel Mormon, MD  DATE OF DISCHARGE: 07/21/2019  PRIMARY CARE PHYSICIAN: Trinna Post, PA-C    ADMISSION DIAGNOSIS:  Syncope and collapse [R55] Closed displaced spiral fracture of shaft of right humerus, initial encounter [S42.341A]  DISCHARGE DIAGNOSIS:  Active Problems:   Recurrent syncope   Syncope   SECONDARY DIAGNOSIS:   Past Medical History:  Diagnosis Date  . Arthritis   . CHF (congestive heart failure) ([place])   . COPD (chronic obstructive pulmonary disease) ([place])   . Coronary artery disease   . Hypercholesteremia   . Hypertension     HOSPITAL COURSE:   85 y/o female with HTN and COPD who presented to ED with a fall after feeling dizzy.  1. Recurrent syncope with minor contusion to chin/lip: Carotid doppler without significant stenosis Tele without any acute etiology Echocardiogram shows moderate to severe aortic valve stenosis which may be partly involved in her recurrent syncopal events. She was also slightly dehydrated on admission which plays a role in her syncopal event. She will need outpatient Holter monitor which can be arranged by cardiology office. As per cardiology consultation no cardiac diagnostics or interventions indicated at this time. As far as her moderate to severe aortic valve stenosis on echocardiogram, cardiology will discuss risk and benefit of possible interventions in outpatient setting.  2. Minimally displaced spiral fracture just distal to a shoulder prosthesis. Ortho has recommended a posterior splint or coaptation splint to help stabilize the fracture along with a shoulder sling.Patient may not require surgery if the fracture maintains its current position She will need outpatient follow up in 1 week with Dr Octavio Manns    3. AKI: This improved  with IVF  4. HTN urgency: This has improved with pain control Continue Metoprolol   5. Depression/Anxiety: Continue Lexapro  6. COPD without signs of exacerbation 7. Elevated TSH  Her PCP should follow-up with T4 and T3 that was ordered however results are pending  DISCHARGE CONDITIONS AND DIET:   Stable for discharge regular diet  CONSULTS OBTAINED:  Treatment Team:  Corey Skains, MD  DRUG ALLERGIES:   Allergies  Allergen Reactions  . Iodine Anaphylaxis  . Shellfish Allergy Anaphylaxis  . Azithromycin Hives  . Sulfa Antibiotics Hives    DISCHARGE MEDICATIONS:   Allergies as of 07/21/2019      Reactions   Iodine Anaphylaxis   Shellfish Allergy Anaphylaxis   Azithromycin Hives   Sulfa Antibiotics Hives      Medication List    TAKE these medications   acetaminophen 500 MG tablet Commonly known as: TYLENOL Take 500-1,000 mg by mouth 2 (two) times daily as needed for mild pain, moderate pain, fever or headache.   albuterol (2.5 MG/3ML) 0.083% nebulizer solution Commonly known as: PROVENTIL Take 2.5 mg by nebulization every 6 (six) hours as needed for wheezing or shortness of breath.   allopurinol 100 MG tablet Commonly known as: ZYLOPRIM TAKE 1 TABLET BY MOUTH ONCE DAILY FOR GOUT.   aspirin EC 81 MG tablet Take 81 mg by mouth daily.   escitalopram 20 MG tablet Commonly known as: LEXAPRO TAKE 1 TABLET BY MOUTH  DAILY   fluticasone 50 MCG/ACT nasal spray Commonly known as: FLONASE Place into the nose.   Fluticasone-Salmeterol 250-50 MCG/DOSE Aepb Commonly known as: Advair Diskus USE ONE (  1) INHALATION BY MOUTH EVERY 12 HOURS. RINSE MOUTH OUT AFTERUSE.   furosemide 20 MG tablet Commonly known as: LASIX TAKE 1 TABLET BY MOUTH  DAILY   ipratropium-albuterol 0.5-2.5 (3) MG/3ML Soln Commonly known as: DUONEB Take 3 mLs by nebulization 3 (three) times daily.   metoprolol succinate 50 MG 24 hr tablet Commonly known as: TOPROL-XL TAKE 1 TABLET  BY MOUTH  DAILY   oxyCODONE-acetaminophen 5-325 MG tablet Commonly known as: PERCOCET/ROXICET Take 1-2 tablets by mouth every 4 (four) hours as needed for severe pain.   simvastatin 20 MG tablet Commonly known as: ZOCOR TAKE 1 TABLET BY MOUTH AT  BEDTIME   traZODone 50 MG tablet Commonly known as: DESYREL TAKE ONE-HALF TABLET BY  MOUTH AT BEDTIME         Today   CHIEF COMPLAINT:  No acute issues overnight.   VITAL SIGNS:  Blood pressure (!) 156/63, pulse 68, temperature 98.1 F (36.7 C), resp. rate 19, height 5\' 1"  (1.549 m), weight 57.2 kg, SpO2 94 %.   REVIEW OF SYSTEMS:  Review of Systems  Constitutional: Negative.  Negative for chills, fever and malaise/fatigue.  HENT: Negative.  Negative for ear discharge, ear pain, hearing loss, nosebleeds and sore throat.        Legally blind   Eyes: Negative.  Negative for blurred vision and pain.  Respiratory: Negative.  Negative for cough, hemoptysis, shortness of breath and wheezing.   Cardiovascular: Negative.  Negative for chest pain, palpitations and leg swelling.  Gastrointestinal: Negative.  Negative for abdominal pain, blood in stool, diarrhea, nausea and vomiting.  Genitourinary: Negative.  Negative for dysuria.  Musculoskeletal: Negative.  Negative for back pain.  Skin:       Bruising chin   Neurological: Negative for dizziness, tremors, speech change, focal weakness, seizures and headaches.  Endo/Heme/Allergies: Negative.  Does not bruise/bleed easily.  Psychiatric/Behavioral: Negative.  Negative for depression, hallucinations and suicidal ideas.     PHYSICAL EXAMINATION:  Constitutional: Appears well-developed and well-nourished. No distress. HENT: Normocephalic. Marland Kitchen Oropharynx is clear and moist.  Eyes: legally blind  no scleral icterus.  Neck: Normal ROM. Neck supple. No JVD. No tracheal deviation. CVS: RRR, S1/S2 +, 3/6 murmurs, no gallops, no carotid bruit.  Pulmonary: Effort and breath sounds normal,  no stridor, rhonchi, wheezes, rales.  Abdominal: Soft. BS +,  no distension, tenderness, rebound or guarding.  Musculoskeletal: sling RUE No edema and no tenderness.  Neuro: Alert. CN 2-12 grossly intact. No focal deficits. Skin:bruising to her chin and small lacerations to her nose and lip. Psychiatric: Normal mood and affect.  DATA REVIEW:   CBC Recent Labs  Lab 07/20/19 0557  WBC 10.4  HGB 10.6*  HCT 32.1*  PLT 165    Chemistries  Recent Labs  Lab 07/20/19 0557  NA 139  K 4.0  CL 103  CO2 25  GLUCOSE 140*  BUN 25*  CREATININE 1.08*  CALCIUM 9.1    Cardiac Enzymes No results for input(s): TROPONINI in the last 168 hours.  Microbiology Results  @[email or web address] @  RADIOLOGY:  Dg Shoulder Right  Result Date: 07/19/2019 CLINICAL DATA:  Recent fall with right arm pain, initial encounter EXAM: RIGHT SHOULDER - 2+ VIEW COMPARISON:  None. FINDINGS: Right shoulder prosthesis is noted. There is a spiral fracture identified just distal to the tip of the prosthesis with mild displacement identified. No definitive periprosthetic fracture is seen. IMPRESSION: Spiral fracture in the midshaft of the right humerus just below the known shoulder prosthesis Electronically  Signed   By: Inez Catalina M.D.   On: 07/19/2019 19:05   Ct Head Wo Contrast  Result Date: 07/19/2019 CLINICAL DATA:  Recent falls EXAM: CT HEAD WITHOUT CONTRAST TECHNIQUE: Contiguous axial images were obtained from the base of the skull through the vertex without intravenous contrast. COMPARISON:  05/29/2017 FINDINGS: Brain: Chronic atrophic and ischemic changes are noted similar to that seen on the prior exam. Bilateral basal ganglia calcifications are seen. No findings to suggest acute hemorrhage, acute infarction or space-occupying mass lesion noted. Vascular: No hyperdense vessel or unexpected calcification. Skull: Normal. Negative for fracture or focal lesion. Sinuses/Orbits: No acute finding. Other: None. IMPRESSION:  Chronic atrophic and ischemic changes without acute abnormality. Electronically Signed   By: Inez Catalina M.D.   On: 07/19/2019 19:08   US Carotid Bilateral  Result Date: 07/20/2019 CLINICAL DATA:  85 year old female with a history of syncope EXAM: BILATERAL CAROTID DUPLEX ULTRASOUND TECHNIQUE: Pearline Cables scale imaging, color Doppler and duplex ultrasound were performed of bilateral carotid and vertebral arteries in the neck. COMPARISON:  05/29/2017 FINDINGS: Criteria: Quantification of carotid stenosis is based on velocity parameters that correlate the residual internal carotid diameter with NASCET-based stenosis levels, using the diameter of the distal internal carotid lumen as the denominator for stenosis measurement. The following velocity measurements were obtained: RIGHT ICA:  Systolic 034 cm/sec, Diastolic 22 cm/sec CCA:  83 cm/sec SYSTOLIC ICA/CCA RATIO:  1.3 ECA:  65 cm/sec LEFT ICA:  Systolic 742 cm/sec, Diastolic 17 cm/sec CCA:  92 cm/sec SYSTOLIC ICA/CCA RATIO:  1.2 ECA:  71 cm/sec Right Brachial SBP: Not acquired Left Brachial SBP: Not acquired RIGHT CAROTID ARTERY: No significant calcified disease of the right common carotid artery. Intermediate waveform maintained. Heterogeneous plaque without significant calcifications at the right carotid bifurcation. Low resistance waveform of the right ICA. No significant tortuosity. RIGHT VERTEBRAL ARTERY: Antegrade flow with low resistance waveform. LEFT CAROTID ARTERY: No significant calcified disease of the left common carotid artery. Intermediate waveform maintained. Heterogeneous plaque at the left carotid bifurcation without significant calcifications. Low resistance waveform of the left ICA. LEFT VERTEBRAL ARTERY:  Antegrade flow with low resistance waveform. IMPRESSION: Color duplex indicates minimal heterogeneous plaque, with no hemodynamically significant stenosis by duplex criteria in the extracranial cerebrovascular circulation. Signed, Dulcy Fanny.  Dellia Nims, RPVI [facility] Electronically Signed   By: Corrie Mckusick D.O.   On: 07/20/2019 09:46   Dg Chest Portable 1 View  Result Date: 07/19/2019 CLINICAL DATA:  85 year old female with fall and syncope. EXAM: PORTABLE CHEST 1 VIEW COMPARISON:  Chest radiograph dated 11/27/2015 FINDINGS: Emphysematous changes of the lungs. No focal consolidation, pleural effusion, or pneumothorax. Diffuse interstitial prominence, likely chronic. Mild cardiomegaly. Median sternotomy wires and mechanical cardiac valve. Atherosclerotic calcification of the aorta. No acute osseous pathology. Osteopenia with degenerative changes of the spine and shoulders. IMPRESSION: 1. No acute cardiopulmonary process. 2. Emphysema. 3. Mild cardiomegaly. Electronically Signed   By: Anner Crete M.D.   On: 07/19/2019 22:59      Allergies as of 07/21/2019      Reactions   Iodine Anaphylaxis   Shellfish Allergy Anaphylaxis   Azithromycin Hives   Sulfa Antibiotics Hives      Medication List    TAKE these medications   acetaminophen 500 MG tablet Commonly known as: TYLENOL Take 500-1,000 mg by mouth 2 (two) times daily as needed for mild pain, moderate pain, fever or headache.   albuterol (2.5 MG/3ML) 0.083% nebulizer solution Commonly  known as: PROVENTIL Take 2.5 mg by nebulization every 6 (six) hours as needed for wheezing or shortness of breath.   allopurinol 100 MG tablet Commonly known as: ZYLOPRIM TAKE 1 TABLET BY MOUTH ONCE DAILY FOR GOUT.   aspirin EC 81 MG tablet Take 81 mg by mouth daily.   escitalopram 20 MG tablet Commonly known as: LEXAPRO TAKE 1 TABLET BY MOUTH  DAILY   fluticasone 50 MCG/ACT nasal spray Commonly known as: FLONASE Place into the nose.   Fluticasone-Salmeterol 250-50 MCG/DOSE Aepb Commonly known as: Advair Diskus USE ONE (1) INHALATION BY MOUTH EVERY 12 HOURS. RINSE MOUTH OUT AFTERUSE.   furosemide 20 MG  tablet Commonly known as: LASIX TAKE 1 TABLET BY MOUTH  DAILY   ipratropium-albuterol 0.5-2.5 (3) MG/3ML Soln Commonly known as: DUONEB Take 3 mLs by nebulization 3 (three) times daily.   metoprolol succinate 50 MG 24 hr tablet Commonly known as: TOPROL-XL TAKE 1 TABLET BY MOUTH  DAILY   oxyCODONE-acetaminophen 5-325 MG tablet Commonly known as: PERCOCET/ROXICET Take 1-2 tablets by mouth every 4 (four) hours as needed for severe pain.   simvastatin 20 MG tablet Commonly known as: ZOCOR TAKE 1 TABLET BY MOUTH AT  BEDTIME   traZODone 50 MG tablet Commonly known as: DESYREL TAKE ONE-HALF TABLET BY  MOUTH AT BEDTIME          Management plans discussed with the patient and she is in agreement. Stable for discharge   Patient should follow up with pcp  CODE STATUS:     Code Status Orders  (From admission, onward)         Start     Ordered   07/19/19 2355  Full code  Continuous     07/19/19 2357        Code Status History    Date Active Date Inactive Code Status Order ID Comments User Context   11/28/2015 0003 11/28/2015 1646 Full Code [id number]  Theodoro Grist, MD Inpatient   Advance Care Planning Activity    Advance Directive Documentation     Most Recent Value  Type of Advance Directive  Living will  Pre-existing out of facility DNR order (yellow form or pink MOST form)  -  "MOST" Form in Place?  -      TOTAL TIME TAKING CARE OF THIS PATIENT: 38 minutes.    Note: This dictation was prepared with Dragon dictation along with smaller phrase technology. Any transcriptional errors that result from this process are unintentional.  Bettey Costa M.D on 07/21/2019 at 12:05 PM  Between 7am to 6pm - Pager - [phone] After 6pm go to [email or web address] - password [email or web address] [facility]  Office  [phone]  CC: Primary care physician; Trinna Post, PA-C

## 2019-07-22 ENCOUNTER — Telehealth: Payer: Self-pay

## 2019-07-22 DIAGNOSIS — S42341D Displaced spiral fracture of shaft of humerus, right arm, subsequent encounter for fracture with routine healing: Secondary | ICD-10-CM | POA: Diagnosis not present

## 2019-07-22 DIAGNOSIS — N179 Acute kidney failure, unspecified: Secondary | ICD-10-CM | POA: Diagnosis not present

## 2019-07-22 DIAGNOSIS — J449 Chronic obstructive pulmonary disease, unspecified: Secondary | ICD-10-CM | POA: Diagnosis not present

## 2019-07-22 DIAGNOSIS — Z7982 Long term (current) use of aspirin: Secondary | ICD-10-CM | POA: Diagnosis not present

## 2019-07-22 DIAGNOSIS — F419 Anxiety disorder, unspecified: Secondary | ICD-10-CM | POA: Diagnosis not present

## 2019-07-22 DIAGNOSIS — Z9181 History of falling: Secondary | ICD-10-CM | POA: Diagnosis not present

## 2019-07-22 DIAGNOSIS — Z952 Presence of prosthetic heart valve: Secondary | ICD-10-CM | POA: Diagnosis not present

## 2019-07-22 DIAGNOSIS — Z7951 Long term (current) use of inhaled steroids: Secondary | ICD-10-CM | POA: Diagnosis not present

## 2019-07-22 DIAGNOSIS — F329 Major depressive disorder, single episode, unspecified: Secondary | ICD-10-CM | POA: Diagnosis not present

## 2019-07-22 DIAGNOSIS — I509 Heart failure, unspecified: Secondary | ICD-10-CM | POA: Diagnosis not present

## 2019-07-22 DIAGNOSIS — I2510 Atherosclerotic heart disease of native coronary artery without angina pectoris: Secondary | ICD-10-CM | POA: Diagnosis not present

## 2019-07-22 DIAGNOSIS — S00531D Contusion of lip, subsequent encounter: Secondary | ICD-10-CM | POA: Diagnosis not present

## 2019-07-22 DIAGNOSIS — H548 Legal blindness, as defined in USA: Secondary | ICD-10-CM | POA: Diagnosis not present

## 2019-07-22 DIAGNOSIS — I480 Paroxysmal atrial fibrillation: Secondary | ICD-10-CM | POA: Diagnosis not present

## 2019-07-22 DIAGNOSIS — I110 Hypertensive heart disease with heart failure: Secondary | ICD-10-CM | POA: Diagnosis not present

## 2019-07-22 DIAGNOSIS — R55 Syncope and collapse: Secondary | ICD-10-CM | POA: Diagnosis not present

## 2019-07-22 DIAGNOSIS — M1990 Unspecified osteoarthritis, unspecified site: Secondary | ICD-10-CM | POA: Diagnosis not present

## 2019-07-22 DIAGNOSIS — S0083XD Contusion of other part of head, subsequent encounter: Secondary | ICD-10-CM | POA: Diagnosis not present

## 2019-07-22 DIAGNOSIS — E7800 Pure hypercholesterolemia, unspecified: Secondary | ICD-10-CM | POA: Diagnosis not present

## 2019-07-22 NOTE — Telephone Encounter (Signed)
HFU scheduled 07/29/19.

## 2019-07-22 NOTE — Telephone Encounter (Signed)
Marilyn Oconnell w/ [facility] [phone] was advised of the verbal order per Adriana.

## 2019-07-22 NOTE — Telephone Encounter (Signed)
Ok to give verbal 

## 2019-07-22 NOTE — Telephone Encounter (Signed)
Nolberto Hanlon PT with [facility] is requesting verbal orders for PT one time this week and 3 times a week for 2 weeks, and 2 times a week for 4 weeks. CB#[phone]

## 2019-07-22 NOTE — Telephone Encounter (Signed)
Transition Care Management Follow-up Telephone Call  Date of discharge and from where: [facility] on 07/21/19  How have you been since you were released from the hospital? Doing ok still weak from hospital visit. Pt has had 2 pain pills since being home which niece thinks is good for her. Declines SOB, fever, pain or n/v/d.  Any questions or concerns? No   Items Reviewed:  Did the pt receive and understand the discharge instructions provided? Yes   Medications obtained and verified? Declined reviewing over the phone.   Any new allergies since your discharge? No   Dietary orders reviewed? Yes  Do you have support at home? Yes   Other (ie: DME, Home Health, etc) Pt was ordered PT, OT and nursing for 24 hour care.  Functional Questionnaire: (I = Independent and D = Dependent)  Bathing/Dressing- D   Meal Prep- D  Eating- I  Maintaining continence- D  Transferring/Ambulation- D, uses a cane, walker or wheelchair.  Managing Meds- D   Follow up appointments reviewed:    PCP Hospital f/u appt confirmed? Yes  Scheduled to see Carles Collet on 07/29/19 @ 1:20 PM.  [facility] f/u appt confirmed? Yes    Are transportation arrangements needed? No   If their condition worsens, is the pt aware to call  their PCP or go to the ED? Yes  Was the patient provided with contact information for the PCP's office or ED? Yes  Was the pt encouraged to call back with questions or concerns? Yes

## 2019-07-23 DIAGNOSIS — R55 Syncope and collapse: Secondary | ICD-10-CM | POA: Diagnosis not present

## 2019-07-23 DIAGNOSIS — I2510 Atherosclerotic heart disease of native coronary artery without angina pectoris: Secondary | ICD-10-CM | POA: Diagnosis not present

## 2019-07-23 DIAGNOSIS — S42341D Displaced spiral fracture of shaft of humerus, right arm, subsequent encounter for fracture with routine healing: Secondary | ICD-10-CM | POA: Diagnosis not present

## 2019-07-23 DIAGNOSIS — I110 Hypertensive heart disease with heart failure: Secondary | ICD-10-CM | POA: Diagnosis not present

## 2019-07-23 DIAGNOSIS — S42201A Unspecified fracture of upper end of right humerus, initial encounter for closed fracture: Secondary | ICD-10-CM | POA: Diagnosis not present

## 2019-07-23 DIAGNOSIS — S00531D Contusion of lip, subsequent encounter: Secondary | ICD-10-CM | POA: Diagnosis not present

## 2019-07-23 DIAGNOSIS — S0083XD Contusion of other part of head, subsequent encounter: Secondary | ICD-10-CM | POA: Diagnosis not present

## 2019-07-26 ENCOUNTER — Telehealth: Payer: Self-pay

## 2019-07-26 DIAGNOSIS — R55 Syncope and collapse: Secondary | ICD-10-CM | POA: Diagnosis not present

## 2019-07-26 DIAGNOSIS — S00531D Contusion of lip, subsequent encounter: Secondary | ICD-10-CM | POA: Diagnosis not present

## 2019-07-26 DIAGNOSIS — I2510 Atherosclerotic heart disease of native coronary artery without angina pectoris: Secondary | ICD-10-CM | POA: Diagnosis not present

## 2019-07-26 DIAGNOSIS — I110 Hypertensive heart disease with heart failure: Secondary | ICD-10-CM | POA: Diagnosis not present

## 2019-07-26 DIAGNOSIS — S0083XD Contusion of other part of head, subsequent encounter: Secondary | ICD-10-CM | POA: Diagnosis not present

## 2019-07-26 DIAGNOSIS — S42341D Displaced spiral fracture of shaft of humerus, right arm, subsequent encounter for fracture with routine healing: Secondary | ICD-10-CM | POA: Diagnosis not present

## 2019-07-26 NOTE — Telephone Encounter (Signed)
Fine to give verbal.  

## 2019-07-26 NOTE — Telephone Encounter (Signed)
Marilyn Oconnell with [facility] is requesting verbal orders for in home nursing visits to monitor pt for heart failure, COPD, & laceration of chin as follows:  Once every other week for 9 weeks  Please advise. Thanks TNP

## 2019-07-26 NOTE — Telephone Encounter (Signed)
Marilyn Oconnell w/ [facility] was advised of the verbal order.

## 2019-07-27 DIAGNOSIS — I110 Hypertensive heart disease with heart failure: Secondary | ICD-10-CM | POA: Diagnosis not present

## 2019-07-27 DIAGNOSIS — S0083XD Contusion of other part of head, subsequent encounter: Secondary | ICD-10-CM | POA: Diagnosis not present

## 2019-07-27 DIAGNOSIS — I2510 Atherosclerotic heart disease of native coronary artery without angina pectoris: Secondary | ICD-10-CM | POA: Diagnosis not present

## 2019-07-27 DIAGNOSIS — R55 Syncope and collapse: Secondary | ICD-10-CM | POA: Diagnosis not present

## 2019-07-27 DIAGNOSIS — S42341D Displaced spiral fracture of shaft of humerus, right arm, subsequent encounter for fracture with routine healing: Secondary | ICD-10-CM | POA: Diagnosis not present

## 2019-07-27 DIAGNOSIS — S00531D Contusion of lip, subsequent encounter: Secondary | ICD-10-CM | POA: Diagnosis not present

## 2019-07-28 DIAGNOSIS — S42341D Displaced spiral fracture of shaft of humerus, right arm, subsequent encounter for fracture with routine healing: Secondary | ICD-10-CM | POA: Diagnosis not present

## 2019-07-28 DIAGNOSIS — S00531D Contusion of lip, subsequent encounter: Secondary | ICD-10-CM | POA: Diagnosis not present

## 2019-07-28 DIAGNOSIS — R55 Syncope and collapse: Secondary | ICD-10-CM | POA: Diagnosis not present

## 2019-07-28 DIAGNOSIS — S0083XD Contusion of other part of head, subsequent encounter: Secondary | ICD-10-CM | POA: Diagnosis not present

## 2019-07-28 DIAGNOSIS — I110 Hypertensive heart disease with heart failure: Secondary | ICD-10-CM | POA: Diagnosis not present

## 2019-07-28 DIAGNOSIS — I2510 Atherosclerotic heart disease of native coronary artery without angina pectoris: Secondary | ICD-10-CM | POA: Diagnosis not present

## 2019-07-28 NOTE — Progress Notes (Signed)
Patient: Marilyn Oconnell Female    DOB: 24-Sep-1923   85 y.o.   MRN: [id number] Visit Date: 07/29/2019  Today's Provider: Trinna Post, PA-C   Chief Complaint  Patient presents with  . Hospitalization Follow-up   Subjective:    HPI  Follow up Hospitalization  Patient was [facility] to [facility] on 07/19/2019 and discharged on 07/21/2019. She was treated for Syncope. She sustained a right humeral fracture Treatment for this included EKG, CT scan and IV of 25 mcg fentanyl and DuoNeb . Telephone follow up was done on 07/22/2019. She reports good compliance with treatment. She reports this condition is gradually improving. Patient is doing PT and OT. She also has 24 hour care and walks only with assistance..  During hospitalization, no explicit cause was found for syncope. CT Head was negative for acute stroke. Echo showed largely stable cardiac function with severe aortic stenosis. She is doing a Holter monitor for 3 days. She was re-evaluated with echo. Currently on 50 mg metoprolol XL which was increased. Repeat carotid ultrasound. Returns to see Dr. Ubaldo Glassing in a few weeks at [facility].   She fractured her right humerus which is currently in a sling. She is following up with ortho this week but consult during hospitalization showed she may not need surgery if fracture remains nondisplaced.   Currently. somebody staying with her 24 hours. There is some discussion about transitioning to assisted living facility between her family. No official decisions have been made yet.  ------------------------------------------------------------------------------------    Allergies  Allergen Reactions  . Iodine Anaphylaxis  . Shellfish Allergy Anaphylaxis  . Azithromycin Hives  . Sulfa Antibiotics Hives     Current Outpatient Medications:  .  acetaminophen (TYLENOL) 500 MG tablet, Take 500-1,000 mg by mouth 2 (two) times daily as needed for mild pain, moderate pain, fever or  headache. , Disp: , Rfl:  .  albuterol (PROVENTIL) (2.5 MG/3ML) 0.083% nebulizer solution, Take 2.5 mg by nebulization every 6 (six) hours as needed for wheezing or shortness of breath., Disp: , Rfl:  .  allopurinol (ZYLOPRIM) 100 MG tablet, TAKE 1 TABLET BY MOUTH ONCE DAILY FOR GOUT., Disp: 90 tablet, Rfl: 3 .  aspirin EC 81 MG tablet, Take 81 mg by mouth daily., Disp: , Rfl:  .  escitalopram (LEXAPRO) 20 MG tablet, TAKE 1 TABLET BY MOUTH  DAILY, Disp: 90 tablet, Rfl: 3 .  Fluticasone-Salmeterol (ADVAIR DISKUS) 250-50 MCG/DOSE AEPB, USE ONE (1) INHALATION BY MOUTH EVERY 12 HOURS. RINSE MOUTH OUT AFTERUSE., Disp: 180 each, Rfl: 3 .  furosemide (LASIX) 20 MG tablet, TAKE 1 TABLET BY MOUTH  DAILY, Disp: 90 tablet, Rfl: 3 .  ipratropium-albuterol (DUONEB) 0.5-2.5 (3) MG/3ML SOLN, Take 3 mLs by nebulization 3 (three) times daily., Disp: , Rfl:  .  metoprolol succinate (TOPROL-XL) 50 MG 24 hr tablet, TAKE 1 TABLET BY MOUTH  DAILY, Disp: 90 tablet, Rfl: 3 .  oxyCODONE-acetaminophen (PERCOCET/ROXICET) 5-325 MG tablet, Take 1-2 tablets by mouth every 4 (four) hours as needed for severe pain., Disp: 30 tablet, Rfl: 0 .  simvastatin (ZOCOR) 20 MG tablet, TAKE 1 TABLET BY MOUTH AT  BEDTIME, Disp: 90 tablet, Rfl: 3 .  traMADol (ULTRAM) 50 MG tablet, every 6 (six) hours, Disp: , Rfl:  .  traZODone (DESYREL) 50 MG tablet, TAKE ONE-HALF TABLET BY  MOUTH AT BEDTIME, Disp: 45 tablet, Rfl: 3 .  fluticasone (FLONASE) 50 MCG/ACT nasal spray, Place into the nose., Disp: , Rfl:  Review of Systems  Constitutional: Negative.   Respiratory: Negative.   Genitourinary: Negative.   Neurological: Negative.     Social History   Tobacco Use  . Smoking status: Never Smoker  . Smokeless tobacco: Never Used  Substance Use Topics  . Alcohol use: No      Objective:   BP (!) 147/62 (BP Location: Left Arm, Patient Position: Sitting, Cuff Size: Normal)   Pulse 68   Temp (!) 96.9 F (36.1 C) (Temporal)  Vitals:    07/29/19 1331  BP: (!) 147/62  Pulse: 68  Temp: (!) 96.9 F (36.1 C)  TempSrc: Temporal  There is no height or weight on file to calculate BMI.   Physical Exam Constitutional:      Appearance: Normal appearance.     Comments: Frail, tired appearing elderly woman.   Cardiovascular:     Rate and Rhythm: Regular rhythm.     Heart sounds: Murmur present.  Pulmonary:     Breath sounds: Normal breath sounds.  Musculoskeletal:     Comments: Right arm is in a sling.   Neurological:     Mental Status: She is alert and oriented to person, place, and time. Mental status is at baseline.  Psychiatric:        Mood and Affect: Mood normal.        Behavior: Behavior normal.      No results found for any visits on 07/29/19.     Assessment & Plan    1. Syncope, unspecified syncope type  She is undergoing Holter monitor with further testing at Cardiology. She has a follow up with Dr. Mack Guise this week regarding right humeral fracture. She has somebody staying at her house 24/7. May transition to assisted living.  2. Aortic valve stenosis, etiology of cardiac valve disease unspecified  The entirety of the information documented in the History of Present Illness, Review of Systems and Physical Exam were personally obtained by me. Portions of this information were initially documented by [facility], CMA and reviewed by me for thoroughness and accuracy.   F/u PRN        Trinna Post, PA-C  [facility]

## 2019-07-29 ENCOUNTER — Other Ambulatory Visit: Payer: Self-pay

## 2019-07-29 ENCOUNTER — Ambulatory Visit (INDEPENDENT_AMBULATORY_CARE_PROVIDER_SITE_OTHER): Payer: Medicare Other | Admitting: Physician Assistant

## 2019-07-29 ENCOUNTER — Encounter: Payer: Self-pay | Admitting: Physician Assistant

## 2019-07-29 VITALS — BP 147/62 | HR 68 | Temp 96.9°F

## 2019-07-29 DIAGNOSIS — R55 Syncope and collapse: Secondary | ICD-10-CM | POA: Diagnosis not present

## 2019-07-29 DIAGNOSIS — J439 Emphysema, unspecified: Secondary | ICD-10-CM | POA: Diagnosis not present

## 2019-07-29 DIAGNOSIS — I350 Nonrheumatic aortic (valve) stenosis: Secondary | ICD-10-CM | POA: Diagnosis not present

## 2019-07-29 DIAGNOSIS — I480 Paroxysmal atrial fibrillation: Secondary | ICD-10-CM | POA: Diagnosis not present

## 2019-07-29 DIAGNOSIS — I341 Nonrheumatic mitral (valve) prolapse: Secondary | ICD-10-CM | POA: Diagnosis not present

## 2019-07-29 DIAGNOSIS — I10 Essential (primary) hypertension: Secondary | ICD-10-CM | POA: Diagnosis not present

## 2019-07-29 DIAGNOSIS — I2510 Atherosclerotic heart disease of native coronary artery without angina pectoris: Secondary | ICD-10-CM | POA: Diagnosis not present

## 2019-07-29 NOTE — Patient Instructions (Signed)
Syncope °Syncope is when you pass out (faint) for a short time. It is caused by a sudden decrease in blood flow to the brain. Signs that you may be about to pass out include: °· Feeling dizzy or light-headed. °· Feeling sick to your stomach (nauseous). °· Seeing all white or all black. °· Having cold, clammy skin. °If you pass out, get help right away. Call your local emergency services (911 in the [place] Do not drive yourself to the hospital. °Follow these instructions at home: °Watch for any changes in your symptoms. Take these actions to stay safe and help with your symptoms: °Lifestyle °· Do not drive, use machinery, or play sports until your doctor says it is okay. °· Do not drink alcohol. °· Do not use any products that contain nicotine or tobacco, such as cigarettes and e-cigarettes. If you need help quitting, ask your doctor. °· Drink enough fluid to keep your pee (urine) pale yellow. °General instructions °· Take over-the-counter and prescription medicines only as told by your doctor. °· If you are taking blood pressure or heart medicine, sit up and stand up slowly. Spend a few minutes getting ready to sit and then stand. This can help you feel less dizzy. °· Have someone stay with you until you feel stable. °· If you start to feel like you might pass out, lie down right away and raise (elevate) your feet above the level of your heart. Breathe deeply and steadily. Wait until all of the symptoms are gone. °· Keep all follow-up visits as told by your doctor. This is important. °Get help right away if: °· You have a very bad headache. °· You pass out once or more than once. °· You have pain in your chest, belly, or back. °· You have a very fast or uneven heartbeat (palpitations). °· It hurts to breathe. °· You are bleeding from your mouth or your bottom (rectum). °· You have black or tarry poop (stool). °· You have jerky movements that you cannot control (seizure). °· You are confused. °· You have trouble  walking. °· You are very weak. °· You have vision problems. °These symptoms may be an emergency. Do not wait to see if the symptoms will go away. Get medical help right away. Call your local emergency services (911 in the [place] Do not drive yourself to the hospital. °Summary °· Syncope is when you pass out (faint) for a short time. It is caused by a sudden decrease in blood flow to the brain. °· Signs that you may be about to faint include feeling dizzy, light-headed, or sick to your stomach, seeing all white or all black, or having cold, clammy skin. °· If you start to feel like you might pass out, lie down right away and raise (elevate) your feet above the level of your heart. Breathe deeply and steadily. Wait until all of the symptoms are gone. °This information is not intended to replace advice given to you by your health care provider. Make sure you discuss any questions you have with your health care provider. °Document Released: 02/26/2008 Document Revised: 10/22/2017 Document Reviewed: 10/22/2017 °Elsevier Patient Education © 2020 [employer] ° °

## 2019-08-03 DIAGNOSIS — S00531D Contusion of lip, subsequent encounter: Secondary | ICD-10-CM | POA: Diagnosis not present

## 2019-08-03 DIAGNOSIS — S42341D Displaced spiral fracture of shaft of humerus, right arm, subsequent encounter for fracture with routine healing: Secondary | ICD-10-CM | POA: Diagnosis not present

## 2019-08-03 DIAGNOSIS — I2510 Atherosclerotic heart disease of native coronary artery without angina pectoris: Secondary | ICD-10-CM | POA: Diagnosis not present

## 2019-08-03 DIAGNOSIS — R55 Syncope and collapse: Secondary | ICD-10-CM | POA: Diagnosis not present

## 2019-08-03 DIAGNOSIS — S0083XD Contusion of other part of head, subsequent encounter: Secondary | ICD-10-CM | POA: Diagnosis not present

## 2019-08-03 DIAGNOSIS — I110 Hypertensive heart disease with heart failure: Secondary | ICD-10-CM | POA: Diagnosis not present

## 2019-08-04 DIAGNOSIS — R55 Syncope and collapse: Secondary | ICD-10-CM | POA: Diagnosis not present

## 2019-08-04 DIAGNOSIS — S0083XD Contusion of other part of head, subsequent encounter: Secondary | ICD-10-CM | POA: Diagnosis not present

## 2019-08-04 DIAGNOSIS — I110 Hypertensive heart disease with heart failure: Secondary | ICD-10-CM | POA: Diagnosis not present

## 2019-08-04 DIAGNOSIS — S42341D Displaced spiral fracture of shaft of humerus, right arm, subsequent encounter for fracture with routine healing: Secondary | ICD-10-CM | POA: Diagnosis not present

## 2019-08-04 DIAGNOSIS — S00531D Contusion of lip, subsequent encounter: Secondary | ICD-10-CM | POA: Diagnosis not present

## 2019-08-04 DIAGNOSIS — I2510 Atherosclerotic heart disease of native coronary artery without angina pectoris: Secondary | ICD-10-CM | POA: Diagnosis not present

## 2019-08-06 DIAGNOSIS — S42341D Displaced spiral fracture of shaft of humerus, right arm, subsequent encounter for fracture with routine healing: Secondary | ICD-10-CM | POA: Diagnosis not present

## 2019-08-06 DIAGNOSIS — S0083XD Contusion of other part of head, subsequent encounter: Secondary | ICD-10-CM | POA: Diagnosis not present

## 2019-08-06 DIAGNOSIS — R55 Syncope and collapse: Secondary | ICD-10-CM | POA: Diagnosis not present

## 2019-08-06 DIAGNOSIS — S42201A Unspecified fracture of upper end of right humerus, initial encounter for closed fracture: Secondary | ICD-10-CM | POA: Diagnosis not present

## 2019-08-06 DIAGNOSIS — I2510 Atherosclerotic heart disease of native coronary artery without angina pectoris: Secondary | ICD-10-CM | POA: Diagnosis not present

## 2019-08-06 DIAGNOSIS — I110 Hypertensive heart disease with heart failure: Secondary | ICD-10-CM | POA: Diagnosis not present

## 2019-08-06 DIAGNOSIS — S00531D Contusion of lip, subsequent encounter: Secondary | ICD-10-CM | POA: Diagnosis not present

## 2019-08-07 DIAGNOSIS — R509 Fever, unspecified: Secondary | ICD-10-CM | POA: Diagnosis not present

## 2019-08-07 DIAGNOSIS — N390 Urinary tract infection, site not specified: Secondary | ICD-10-CM | POA: Diagnosis not present

## 2019-08-07 DIAGNOSIS — F4489 Other dissociative and conversion disorders: Secondary | ICD-10-CM | POA: Diagnosis not present

## 2019-08-07 DIAGNOSIS — R399 Unspecified symptoms and signs involving the genitourinary system: Secondary | ICD-10-CM | POA: Diagnosis not present

## 2019-08-09 DIAGNOSIS — S00531D Contusion of lip, subsequent encounter: Secondary | ICD-10-CM | POA: Diagnosis not present

## 2019-08-09 DIAGNOSIS — I110 Hypertensive heart disease with heart failure: Secondary | ICD-10-CM | POA: Diagnosis not present

## 2019-08-09 DIAGNOSIS — S0083XD Contusion of other part of head, subsequent encounter: Secondary | ICD-10-CM | POA: Diagnosis not present

## 2019-08-09 DIAGNOSIS — S42341D Displaced spiral fracture of shaft of humerus, right arm, subsequent encounter for fracture with routine healing: Secondary | ICD-10-CM | POA: Diagnosis not present

## 2019-08-09 DIAGNOSIS — R55 Syncope and collapse: Secondary | ICD-10-CM | POA: Diagnosis not present

## 2019-08-09 DIAGNOSIS — I2510 Atherosclerotic heart disease of native coronary artery without angina pectoris: Secondary | ICD-10-CM | POA: Diagnosis not present

## 2019-08-10 DIAGNOSIS — S00531D Contusion of lip, subsequent encounter: Secondary | ICD-10-CM | POA: Diagnosis not present

## 2019-08-10 DIAGNOSIS — S42341D Displaced spiral fracture of shaft of humerus, right arm, subsequent encounter for fracture with routine healing: Secondary | ICD-10-CM | POA: Diagnosis not present

## 2019-08-10 DIAGNOSIS — I110 Hypertensive heart disease with heart failure: Secondary | ICD-10-CM | POA: Diagnosis not present

## 2019-08-10 DIAGNOSIS — R55 Syncope and collapse: Secondary | ICD-10-CM | POA: Diagnosis not present

## 2019-08-10 DIAGNOSIS — I2510 Atherosclerotic heart disease of native coronary artery without angina pectoris: Secondary | ICD-10-CM | POA: Diagnosis not present

## 2019-08-10 DIAGNOSIS — S0083XD Contusion of other part of head, subsequent encounter: Secondary | ICD-10-CM | POA: Diagnosis not present

## 2019-08-11 DIAGNOSIS — I480 Paroxysmal atrial fibrillation: Secondary | ICD-10-CM | POA: Diagnosis not present

## 2019-08-12 DIAGNOSIS — R55 Syncope and collapse: Secondary | ICD-10-CM | POA: Diagnosis not present

## 2019-08-12 DIAGNOSIS — S0083XD Contusion of other part of head, subsequent encounter: Secondary | ICD-10-CM | POA: Diagnosis not present

## 2019-08-12 DIAGNOSIS — S00531D Contusion of lip, subsequent encounter: Secondary | ICD-10-CM | POA: Diagnosis not present

## 2019-08-12 DIAGNOSIS — I110 Hypertensive heart disease with heart failure: Secondary | ICD-10-CM | POA: Diagnosis not present

## 2019-08-12 DIAGNOSIS — I2510 Atherosclerotic heart disease of native coronary artery without angina pectoris: Secondary | ICD-10-CM | POA: Diagnosis not present

## 2019-08-12 DIAGNOSIS — S42341D Displaced spiral fracture of shaft of humerus, right arm, subsequent encounter for fracture with routine healing: Secondary | ICD-10-CM | POA: Diagnosis not present

## 2019-08-17 DIAGNOSIS — S0083XD Contusion of other part of head, subsequent encounter: Secondary | ICD-10-CM | POA: Diagnosis not present

## 2019-08-17 DIAGNOSIS — I110 Hypertensive heart disease with heart failure: Secondary | ICD-10-CM | POA: Diagnosis not present

## 2019-08-17 DIAGNOSIS — R55 Syncope and collapse: Secondary | ICD-10-CM | POA: Diagnosis not present

## 2019-08-17 DIAGNOSIS — S42341D Displaced spiral fracture of shaft of humerus, right arm, subsequent encounter for fracture with routine healing: Secondary | ICD-10-CM | POA: Diagnosis not present

## 2019-08-17 DIAGNOSIS — I2510 Atherosclerotic heart disease of native coronary artery without angina pectoris: Secondary | ICD-10-CM | POA: Diagnosis not present

## 2019-08-17 DIAGNOSIS — S00531D Contusion of lip, subsequent encounter: Secondary | ICD-10-CM | POA: Diagnosis not present

## 2019-08-18 ENCOUNTER — Ambulatory Visit: Payer: Self-pay | Admitting: *Deleted

## 2019-08-18 DIAGNOSIS — N390 Urinary tract infection, site not specified: Secondary | ICD-10-CM | POA: Diagnosis not present

## 2019-08-18 DIAGNOSIS — S42201A Unspecified fracture of upper end of right humerus, initial encounter for closed fracture: Secondary | ICD-10-CM | POA: Diagnosis not present

## 2019-08-18 DIAGNOSIS — R3915 Urgency of urination: Secondary | ICD-10-CM | POA: Diagnosis not present

## 2019-08-18 NOTE — Telephone Encounter (Signed)
Pt's niece Sharyn Lull states the pt had a uti 2 weeks ago,  And was treated at the  [facility]; the pt is having chills, and "trouble (not able to urinate");explained due to pt symptoms virtual is an option; she says that they can not get a urine sample at a virtual appt explained there may be accommodations after discussion per the provider; Sharyn Lull states that she will take the pt to [facility] because they will see her there; the pt sees Jeanell Sparrow, BFP; will route to office for notification.  Answer Assessment - Initial Assessment Questions 1. SYMPTOM: "What's the main symptom you're concerned about?" (e.g., frequency, incontinence)     Unable to urinate 2. ONSET: "When did the   start?"     ? Pt's niece is not with her 3. PAIN: "Is there any pain?" If so, ask: "How bad is it?" (Scale: 1-10; mild, moderate, severe)    ? Pt's niece is not with her 4. CAUSE: "What do you think is causing the symptoms?"     uti 5. OTHER SYMPTOMS: "Do you have any other symptoms?" (e.g., fever, flank pain, blood in urine, pain with urination)   chills 6. PREGNANCY: "Is there any chance you are pregnant?" "When was your last menstrual period?"     no  Protocols used: URINARY [facility]

## 2019-08-18 NOTE — Telephone Encounter (Signed)
Yes, unfortunately we are not seeing people with fever or fever-like symptoms in person. If she doesn't want to do a virtual visit then she is welcome to go to [facility]. I reviewed her most recent urine culture from [facility] and it did not grow out a bacteria to represent a true infection so she may need further evaluation in person anyway.

## 2019-08-18 NOTE — Telephone Encounter (Signed)
Contacted the patient's Niece on her number [phone] that gave [place] permission to leave a detailed message. A message was left stating since the patient denies a virtual visit it would be best for her to be evaluated in person due to her showing symptoms but we could not see her in our office due to possible fever however [facility], [facility] and if needed, the ED could evaluate her in person.

## 2019-08-18 NOTE — Telephone Encounter (Signed)
Pt's niece Sharyn Lull called stating the pt has chills, and signs of UTI; they do not want a virtual appointment; her niece hung up prior to call transfer from agent; agent tried to call back but no answer; will attempt to contact pt.

## 2019-08-18 NOTE — Telephone Encounter (Signed)
Please advise 

## 2019-08-20 DIAGNOSIS — R55 Syncope and collapse: Secondary | ICD-10-CM | POA: Diagnosis not present

## 2019-08-20 DIAGNOSIS — I110 Hypertensive heart disease with heart failure: Secondary | ICD-10-CM | POA: Diagnosis not present

## 2019-08-20 DIAGNOSIS — S0083XD Contusion of other part of head, subsequent encounter: Secondary | ICD-10-CM | POA: Diagnosis not present

## 2019-08-20 DIAGNOSIS — S42341D Displaced spiral fracture of shaft of humerus, right arm, subsequent encounter for fracture with routine healing: Secondary | ICD-10-CM | POA: Diagnosis not present

## 2019-08-20 DIAGNOSIS — S00531D Contusion of lip, subsequent encounter: Secondary | ICD-10-CM | POA: Diagnosis not present

## 2019-08-20 DIAGNOSIS — I2510 Atherosclerotic heart disease of native coronary artery without angina pectoris: Secondary | ICD-10-CM | POA: Diagnosis not present

## 2019-08-21 DIAGNOSIS — S00531D Contusion of lip, subsequent encounter: Secondary | ICD-10-CM | POA: Diagnosis not present

## 2019-08-21 DIAGNOSIS — H548 Legal blindness, as defined in USA: Secondary | ICD-10-CM | POA: Diagnosis not present

## 2019-08-21 DIAGNOSIS — F329 Major depressive disorder, single episode, unspecified: Secondary | ICD-10-CM | POA: Diagnosis not present

## 2019-08-21 DIAGNOSIS — M1990 Unspecified osteoarthritis, unspecified site: Secondary | ICD-10-CM | POA: Diagnosis not present

## 2019-08-21 DIAGNOSIS — Z9181 History of falling: Secondary | ICD-10-CM | POA: Diagnosis not present

## 2019-08-21 DIAGNOSIS — E7800 Pure hypercholesterolemia, unspecified: Secondary | ICD-10-CM | POA: Diagnosis not present

## 2019-08-21 DIAGNOSIS — I110 Hypertensive heart disease with heart failure: Secondary | ICD-10-CM | POA: Diagnosis not present

## 2019-08-21 DIAGNOSIS — I480 Paroxysmal atrial fibrillation: Secondary | ICD-10-CM | POA: Diagnosis not present

## 2019-08-21 DIAGNOSIS — J449 Chronic obstructive pulmonary disease, unspecified: Secondary | ICD-10-CM | POA: Diagnosis not present

## 2019-08-21 DIAGNOSIS — Z7982 Long term (current) use of aspirin: Secondary | ICD-10-CM | POA: Diagnosis not present

## 2019-08-21 DIAGNOSIS — Z7951 Long term (current) use of inhaled steroids: Secondary | ICD-10-CM | POA: Diagnosis not present

## 2019-08-21 DIAGNOSIS — R55 Syncope and collapse: Secondary | ICD-10-CM | POA: Diagnosis not present

## 2019-08-21 DIAGNOSIS — I509 Heart failure, unspecified: Secondary | ICD-10-CM | POA: Diagnosis not present

## 2019-08-21 DIAGNOSIS — S0083XD Contusion of other part of head, subsequent encounter: Secondary | ICD-10-CM | POA: Diagnosis not present

## 2019-08-21 DIAGNOSIS — I2510 Atherosclerotic heart disease of native coronary artery without angina pectoris: Secondary | ICD-10-CM | POA: Diagnosis not present

## 2019-08-21 DIAGNOSIS — S42341D Displaced spiral fracture of shaft of humerus, right arm, subsequent encounter for fracture with routine healing: Secondary | ICD-10-CM | POA: Diagnosis not present

## 2019-08-21 DIAGNOSIS — F419 Anxiety disorder, unspecified: Secondary | ICD-10-CM | POA: Diagnosis not present

## 2019-08-21 DIAGNOSIS — N179 Acute kidney failure, unspecified: Secondary | ICD-10-CM | POA: Diagnosis not present

## 2019-08-21 DIAGNOSIS — Z952 Presence of prosthetic heart valve: Secondary | ICD-10-CM | POA: Diagnosis not present

## 2019-08-24 DIAGNOSIS — I2510 Atherosclerotic heart disease of native coronary artery without angina pectoris: Secondary | ICD-10-CM | POA: Diagnosis not present

## 2019-08-24 DIAGNOSIS — S00531D Contusion of lip, subsequent encounter: Secondary | ICD-10-CM | POA: Diagnosis not present

## 2019-08-24 DIAGNOSIS — S0083XD Contusion of other part of head, subsequent encounter: Secondary | ICD-10-CM | POA: Diagnosis not present

## 2019-08-24 DIAGNOSIS — I110 Hypertensive heart disease with heart failure: Secondary | ICD-10-CM | POA: Diagnosis not present

## 2019-08-24 DIAGNOSIS — R55 Syncope and collapse: Secondary | ICD-10-CM | POA: Diagnosis not present

## 2019-08-24 DIAGNOSIS — S42341D Displaced spiral fracture of shaft of humerus, right arm, subsequent encounter for fracture with routine healing: Secondary | ICD-10-CM | POA: Diagnosis not present

## 2019-08-25 DIAGNOSIS — I110 Hypertensive heart disease with heart failure: Secondary | ICD-10-CM | POA: Diagnosis not present

## 2019-08-25 DIAGNOSIS — S00531D Contusion of lip, subsequent encounter: Secondary | ICD-10-CM | POA: Diagnosis not present

## 2019-08-25 DIAGNOSIS — I2510 Atherosclerotic heart disease of native coronary artery without angina pectoris: Secondary | ICD-10-CM | POA: Diagnosis not present

## 2019-08-25 DIAGNOSIS — S0083XD Contusion of other part of head, subsequent encounter: Secondary | ICD-10-CM | POA: Diagnosis not present

## 2019-08-25 DIAGNOSIS — R55 Syncope and collapse: Secondary | ICD-10-CM | POA: Diagnosis not present

## 2019-08-25 DIAGNOSIS — S42341D Displaced spiral fracture of shaft of humerus, right arm, subsequent encounter for fracture with routine healing: Secondary | ICD-10-CM | POA: Diagnosis not present

## 2019-08-26 DIAGNOSIS — I110 Hypertensive heart disease with heart failure: Secondary | ICD-10-CM | POA: Diagnosis not present

## 2019-08-26 DIAGNOSIS — R55 Syncope and collapse: Secondary | ICD-10-CM | POA: Diagnosis not present

## 2019-08-26 DIAGNOSIS — I2510 Atherosclerotic heart disease of native coronary artery without angina pectoris: Secondary | ICD-10-CM | POA: Diagnosis not present

## 2019-08-26 DIAGNOSIS — S42341D Displaced spiral fracture of shaft of humerus, right arm, subsequent encounter for fracture with routine healing: Secondary | ICD-10-CM | POA: Diagnosis not present

## 2019-08-26 DIAGNOSIS — S00531D Contusion of lip, subsequent encounter: Secondary | ICD-10-CM | POA: Diagnosis not present

## 2019-08-26 DIAGNOSIS — S0083XD Contusion of other part of head, subsequent encounter: Secondary | ICD-10-CM | POA: Diagnosis not present

## 2019-08-30 DIAGNOSIS — I110 Hypertensive heart disease with heart failure: Secondary | ICD-10-CM | POA: Diagnosis not present

## 2019-08-30 DIAGNOSIS — S42341D Displaced spiral fracture of shaft of humerus, right arm, subsequent encounter for fracture with routine healing: Secondary | ICD-10-CM | POA: Diagnosis not present

## 2019-08-30 DIAGNOSIS — S00531D Contusion of lip, subsequent encounter: Secondary | ICD-10-CM | POA: Diagnosis not present

## 2019-08-30 DIAGNOSIS — R55 Syncope and collapse: Secondary | ICD-10-CM | POA: Diagnosis not present

## 2019-08-30 DIAGNOSIS — I2510 Atherosclerotic heart disease of native coronary artery without angina pectoris: Secondary | ICD-10-CM | POA: Diagnosis not present

## 2019-08-30 DIAGNOSIS — S0083XD Contusion of other part of head, subsequent encounter: Secondary | ICD-10-CM | POA: Diagnosis not present

## 2019-08-31 DIAGNOSIS — S0083XD Contusion of other part of head, subsequent encounter: Secondary | ICD-10-CM | POA: Diagnosis not present

## 2019-08-31 DIAGNOSIS — I2510 Atherosclerotic heart disease of native coronary artery without angina pectoris: Secondary | ICD-10-CM | POA: Diagnosis not present

## 2019-08-31 DIAGNOSIS — R55 Syncope and collapse: Secondary | ICD-10-CM | POA: Diagnosis not present

## 2019-08-31 DIAGNOSIS — S00531D Contusion of lip, subsequent encounter: Secondary | ICD-10-CM | POA: Diagnosis not present

## 2019-08-31 DIAGNOSIS — I110 Hypertensive heart disease with heart failure: Secondary | ICD-10-CM | POA: Diagnosis not present

## 2019-08-31 DIAGNOSIS — S42341D Displaced spiral fracture of shaft of humerus, right arm, subsequent encounter for fracture with routine healing: Secondary | ICD-10-CM | POA: Diagnosis not present

## 2019-09-02 ENCOUNTER — Other Ambulatory Visit: Payer: Self-pay | Admitting: Physician Assistant

## 2019-09-02 DIAGNOSIS — I2510 Atherosclerotic heart disease of native coronary artery without angina pectoris: Secondary | ICD-10-CM | POA: Diagnosis not present

## 2019-09-02 DIAGNOSIS — I480 Paroxysmal atrial fibrillation: Secondary | ICD-10-CM

## 2019-09-02 DIAGNOSIS — S00531D Contusion of lip, subsequent encounter: Secondary | ICD-10-CM | POA: Diagnosis not present

## 2019-09-02 DIAGNOSIS — R55 Syncope and collapse: Secondary | ICD-10-CM | POA: Diagnosis not present

## 2019-09-02 DIAGNOSIS — I110 Hypertensive heart disease with heart failure: Secondary | ICD-10-CM | POA: Diagnosis not present

## 2019-09-02 DIAGNOSIS — S42341D Displaced spiral fracture of shaft of humerus, right arm, subsequent encounter for fracture with routine healing: Secondary | ICD-10-CM | POA: Diagnosis not present

## 2019-09-02 DIAGNOSIS — S0083XD Contusion of other part of head, subsequent encounter: Secondary | ICD-10-CM | POA: Diagnosis not present

## 2019-09-02 NOTE — Telephone Encounter (Signed)
[facility] faxed refill request for the following medications:  metoprolol succinate (TOPROL-XL) 50 MG 24 hr tablet   Please advise.  Thanks, [employer]

## 2019-09-03 MED ORDER — METOPROLOL SUCCINATE ER 50 MG PO TB24: 50.0000 mg | ORAL_TABLET | Freq: Every day | ORAL | 3 refills | Status: DC

## 2019-09-06 DIAGNOSIS — R55 Syncope and collapse: Secondary | ICD-10-CM | POA: Diagnosis not present

## 2019-09-06 DIAGNOSIS — I2510 Atherosclerotic heart disease of native coronary artery without angina pectoris: Secondary | ICD-10-CM | POA: Diagnosis not present

## 2019-09-06 DIAGNOSIS — I110 Hypertensive heart disease with heart failure: Secondary | ICD-10-CM | POA: Diagnosis not present

## 2019-09-06 DIAGNOSIS — S0083XD Contusion of other part of head, subsequent encounter: Secondary | ICD-10-CM | POA: Diagnosis not present

## 2019-09-06 DIAGNOSIS — S00531D Contusion of lip, subsequent encounter: Secondary | ICD-10-CM | POA: Diagnosis not present

## 2019-09-06 DIAGNOSIS — S42341D Displaced spiral fracture of shaft of humerus, right arm, subsequent encounter for fracture with routine healing: Secondary | ICD-10-CM | POA: Diagnosis not present

## 2019-09-09 DIAGNOSIS — S0083XD Contusion of other part of head, subsequent encounter: Secondary | ICD-10-CM | POA: Diagnosis not present

## 2019-09-09 DIAGNOSIS — I110 Hypertensive heart disease with heart failure: Secondary | ICD-10-CM | POA: Diagnosis not present

## 2019-09-09 DIAGNOSIS — I2510 Atherosclerotic heart disease of native coronary artery without angina pectoris: Secondary | ICD-10-CM | POA: Diagnosis not present

## 2019-09-09 DIAGNOSIS — S00531D Contusion of lip, subsequent encounter: Secondary | ICD-10-CM | POA: Diagnosis not present

## 2019-09-09 DIAGNOSIS — S42341D Displaced spiral fracture of shaft of humerus, right arm, subsequent encounter for fracture with routine healing: Secondary | ICD-10-CM | POA: Diagnosis not present

## 2019-09-09 DIAGNOSIS — R55 Syncope and collapse: Secondary | ICD-10-CM | POA: Diagnosis not present

## 2019-09-14 DIAGNOSIS — S00531D Contusion of lip, subsequent encounter: Secondary | ICD-10-CM | POA: Diagnosis not present

## 2019-09-14 DIAGNOSIS — I2510 Atherosclerotic heart disease of native coronary artery without angina pectoris: Secondary | ICD-10-CM | POA: Diagnosis not present

## 2019-09-14 DIAGNOSIS — I110 Hypertensive heart disease with heart failure: Secondary | ICD-10-CM | POA: Diagnosis not present

## 2019-09-14 DIAGNOSIS — S42341D Displaced spiral fracture of shaft of humerus, right arm, subsequent encounter for fracture with routine healing: Secondary | ICD-10-CM | POA: Diagnosis not present

## 2019-09-14 DIAGNOSIS — R55 Syncope and collapse: Secondary | ICD-10-CM | POA: Diagnosis not present

## 2019-09-14 DIAGNOSIS — S0083XD Contusion of other part of head, subsequent encounter: Secondary | ICD-10-CM | POA: Diagnosis not present

## 2019-09-15 DIAGNOSIS — I2510 Atherosclerotic heart disease of native coronary artery without angina pectoris: Secondary | ICD-10-CM | POA: Diagnosis not present

## 2019-09-15 DIAGNOSIS — S00531D Contusion of lip, subsequent encounter: Secondary | ICD-10-CM | POA: Diagnosis not present

## 2019-09-15 DIAGNOSIS — R55 Syncope and collapse: Secondary | ICD-10-CM | POA: Diagnosis not present

## 2019-09-15 DIAGNOSIS — I110 Hypertensive heart disease with heart failure: Secondary | ICD-10-CM | POA: Diagnosis not present

## 2019-09-15 DIAGNOSIS — S42341D Displaced spiral fracture of shaft of humerus, right arm, subsequent encounter for fracture with routine healing: Secondary | ICD-10-CM | POA: Diagnosis not present

## 2019-09-15 DIAGNOSIS — S0083XD Contusion of other part of head, subsequent encounter: Secondary | ICD-10-CM | POA: Diagnosis not present

## 2019-09-20 DIAGNOSIS — R55 Syncope and collapse: Secondary | ICD-10-CM | POA: Diagnosis not present

## 2019-09-20 DIAGNOSIS — J449 Chronic obstructive pulmonary disease, unspecified: Secondary | ICD-10-CM | POA: Diagnosis not present

## 2019-09-20 DIAGNOSIS — I480 Paroxysmal atrial fibrillation: Secondary | ICD-10-CM | POA: Diagnosis not present

## 2019-09-20 DIAGNOSIS — M1990 Unspecified osteoarthritis, unspecified site: Secondary | ICD-10-CM | POA: Diagnosis not present

## 2019-09-20 DIAGNOSIS — Z9181 History of falling: Secondary | ICD-10-CM | POA: Diagnosis not present

## 2019-09-20 DIAGNOSIS — Z7951 Long term (current) use of inhaled steroids: Secondary | ICD-10-CM | POA: Diagnosis not present

## 2019-09-20 DIAGNOSIS — N179 Acute kidney failure, unspecified: Secondary | ICD-10-CM | POA: Diagnosis not present

## 2019-09-20 DIAGNOSIS — F419 Anxiety disorder, unspecified: Secondary | ICD-10-CM | POA: Diagnosis not present

## 2019-09-20 DIAGNOSIS — E7800 Pure hypercholesterolemia, unspecified: Secondary | ICD-10-CM | POA: Diagnosis not present

## 2019-09-20 DIAGNOSIS — S0083XD Contusion of other part of head, subsequent encounter: Secondary | ICD-10-CM | POA: Diagnosis not present

## 2019-09-20 DIAGNOSIS — Z7982 Long term (current) use of aspirin: Secondary | ICD-10-CM | POA: Diagnosis not present

## 2019-09-20 DIAGNOSIS — S00531D Contusion of lip, subsequent encounter: Secondary | ICD-10-CM | POA: Diagnosis not present

## 2019-09-20 DIAGNOSIS — Z79891 Long term (current) use of opiate analgesic: Secondary | ICD-10-CM | POA: Diagnosis not present

## 2019-09-20 DIAGNOSIS — I110 Hypertensive heart disease with heart failure: Secondary | ICD-10-CM | POA: Diagnosis not present

## 2019-09-20 DIAGNOSIS — Z79899 Other long term (current) drug therapy: Secondary | ICD-10-CM | POA: Diagnosis not present

## 2019-09-20 DIAGNOSIS — F329 Major depressive disorder, single episode, unspecified: Secondary | ICD-10-CM | POA: Diagnosis not present

## 2019-09-20 DIAGNOSIS — I509 Heart failure, unspecified: Secondary | ICD-10-CM | POA: Diagnosis not present

## 2019-09-20 DIAGNOSIS — S42341D Displaced spiral fracture of shaft of humerus, right arm, subsequent encounter for fracture with routine healing: Secondary | ICD-10-CM | POA: Diagnosis not present

## 2019-09-20 DIAGNOSIS — Z952 Presence of prosthetic heart valve: Secondary | ICD-10-CM | POA: Diagnosis not present

## 2019-09-20 DIAGNOSIS — H548 Legal blindness, as defined in USA: Secondary | ICD-10-CM | POA: Diagnosis not present

## 2019-09-20 DIAGNOSIS — I2510 Atherosclerotic heart disease of native coronary artery without angina pectoris: Secondary | ICD-10-CM | POA: Diagnosis not present

## 2019-09-27 DIAGNOSIS — I110 Hypertensive heart disease with heart failure: Secondary | ICD-10-CM | POA: Diagnosis not present

## 2019-09-27 DIAGNOSIS — S0083XD Contusion of other part of head, subsequent encounter: Secondary | ICD-10-CM | POA: Diagnosis not present

## 2019-09-27 DIAGNOSIS — R55 Syncope and collapse: Secondary | ICD-10-CM | POA: Diagnosis not present

## 2019-09-27 DIAGNOSIS — S00531D Contusion of lip, subsequent encounter: Secondary | ICD-10-CM | POA: Diagnosis not present

## 2019-09-27 DIAGNOSIS — S42341D Displaced spiral fracture of shaft of humerus, right arm, subsequent encounter for fracture with routine healing: Secondary | ICD-10-CM | POA: Diagnosis not present

## 2019-09-27 DIAGNOSIS — I2510 Atherosclerotic heart disease of native coronary artery without angina pectoris: Secondary | ICD-10-CM | POA: Diagnosis not present

## 2019-10-04 DIAGNOSIS — I2510 Atherosclerotic heart disease of native coronary artery without angina pectoris: Secondary | ICD-10-CM | POA: Diagnosis not present

## 2019-10-04 DIAGNOSIS — I110 Hypertensive heart disease with heart failure: Secondary | ICD-10-CM | POA: Diagnosis not present

## 2019-10-04 DIAGNOSIS — S42341D Displaced spiral fracture of shaft of humerus, right arm, subsequent encounter for fracture with routine healing: Secondary | ICD-10-CM | POA: Diagnosis not present

## 2019-10-04 DIAGNOSIS — S0083XD Contusion of other part of head, subsequent encounter: Secondary | ICD-10-CM | POA: Diagnosis not present

## 2019-10-04 DIAGNOSIS — S00531D Contusion of lip, subsequent encounter: Secondary | ICD-10-CM | POA: Diagnosis not present

## 2019-10-04 DIAGNOSIS — R55 Syncope and collapse: Secondary | ICD-10-CM | POA: Diagnosis not present

## 2019-10-06 DIAGNOSIS — S42201A Unspecified fracture of upper end of right humerus, initial encounter for closed fracture: Secondary | ICD-10-CM | POA: Diagnosis not present

## 2019-10-06 DIAGNOSIS — M5412 Radiculopathy, cervical region: Secondary | ICD-10-CM | POA: Diagnosis not present

## 2019-10-07 DIAGNOSIS — I110 Hypertensive heart disease with heart failure: Secondary | ICD-10-CM | POA: Diagnosis not present

## 2019-10-07 DIAGNOSIS — S42341D Displaced spiral fracture of shaft of humerus, right arm, subsequent encounter for fracture with routine healing: Secondary | ICD-10-CM | POA: Diagnosis not present

## 2019-10-07 DIAGNOSIS — S0083XD Contusion of other part of head, subsequent encounter: Secondary | ICD-10-CM | POA: Diagnosis not present

## 2019-10-07 DIAGNOSIS — S00531D Contusion of lip, subsequent encounter: Secondary | ICD-10-CM | POA: Diagnosis not present

## 2019-10-07 DIAGNOSIS — R55 Syncope and collapse: Secondary | ICD-10-CM | POA: Diagnosis not present

## 2019-10-07 DIAGNOSIS — I2510 Atherosclerotic heart disease of native coronary artery without angina pectoris: Secondary | ICD-10-CM | POA: Diagnosis not present

## 2019-10-15 DIAGNOSIS — Z23 Encounter for immunization: Secondary | ICD-10-CM | POA: Diagnosis not present

## 2019-10-19 DIAGNOSIS — R69 Illness, unspecified: Secondary | ICD-10-CM | POA: Diagnosis not present

## 2019-10-19 DIAGNOSIS — R5381 Other malaise: Secondary | ICD-10-CM | POA: Diagnosis not present

## 2019-10-20 DIAGNOSIS — M5412 Radiculopathy, cervical region: Secondary | ICD-10-CM | POA: Diagnosis not present

## 2019-10-28 DIAGNOSIS — M4802 Spinal stenosis, cervical region: Secondary | ICD-10-CM | POA: Diagnosis not present

## 2019-10-28 DIAGNOSIS — M47812 Spondylosis without myelopathy or radiculopathy, cervical region: Secondary | ICD-10-CM | POA: Diagnosis not present

## 2019-10-28 DIAGNOSIS — M5412 Radiculopathy, cervical region: Secondary | ICD-10-CM | POA: Diagnosis not present

## 2019-10-28 DIAGNOSIS — M5021 Other cervical disc displacement,  high cervical region: Secondary | ICD-10-CM | POA: Diagnosis not present

## 2019-11-17 DIAGNOSIS — M5412 Radiculopathy, cervical region: Secondary | ICD-10-CM | POA: Diagnosis not present

## 2019-11-23 IMAGING — CR DG ELBOW COMPLETE 3+V*R*
1 series · 5 of 5 positions shown · non-contrast
Comparison: None.

CLINICAL DATA: Fall, right elbow pain

EXAM:
RIGHT ELBOW - COMPLETE 3+ VIEW

[Series 1: dg elbow complete right (3+view) · 0.14mm/px · 5 of 5 slices shown]
[im 1/5]
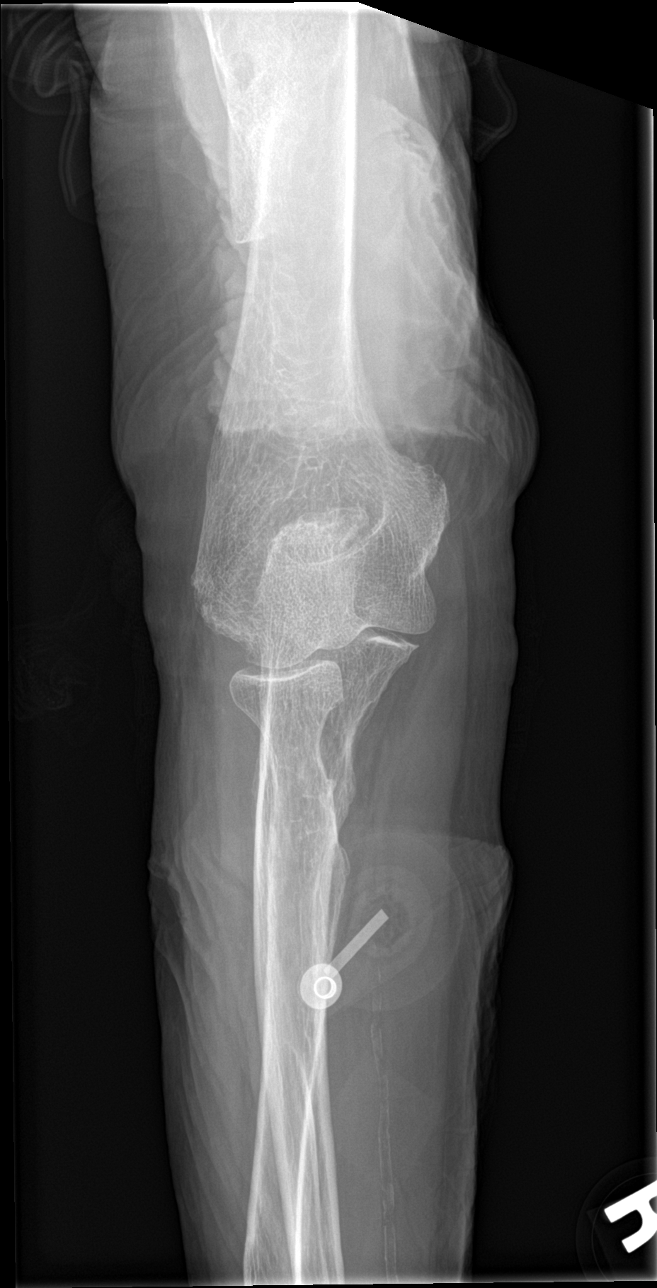
[im 2/5]
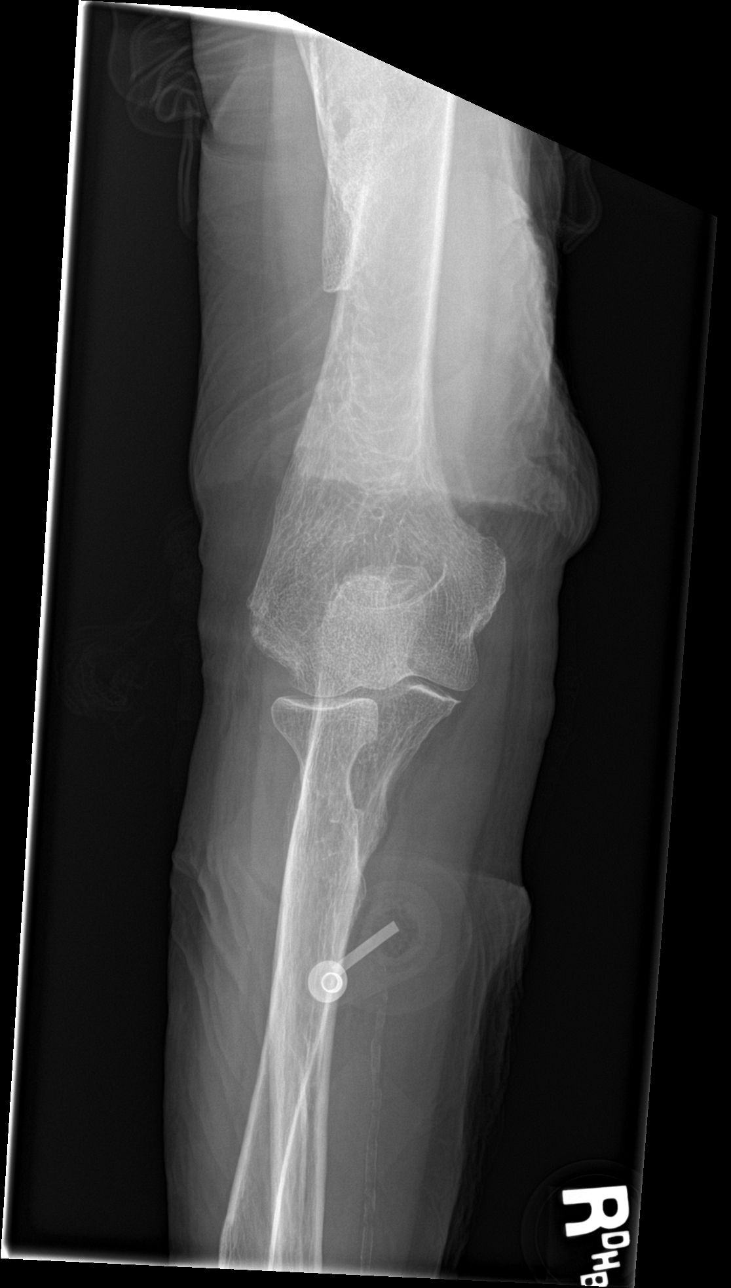
[im 3/5]
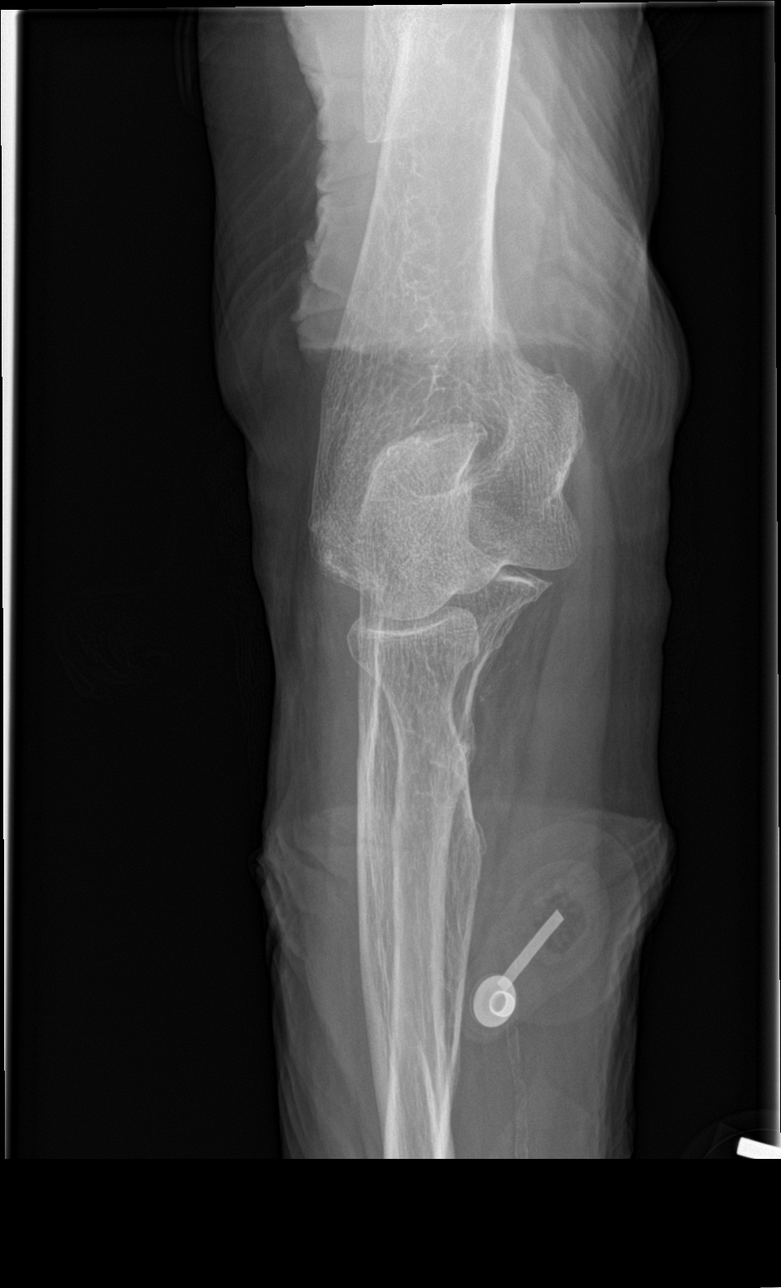
[im 4/5]
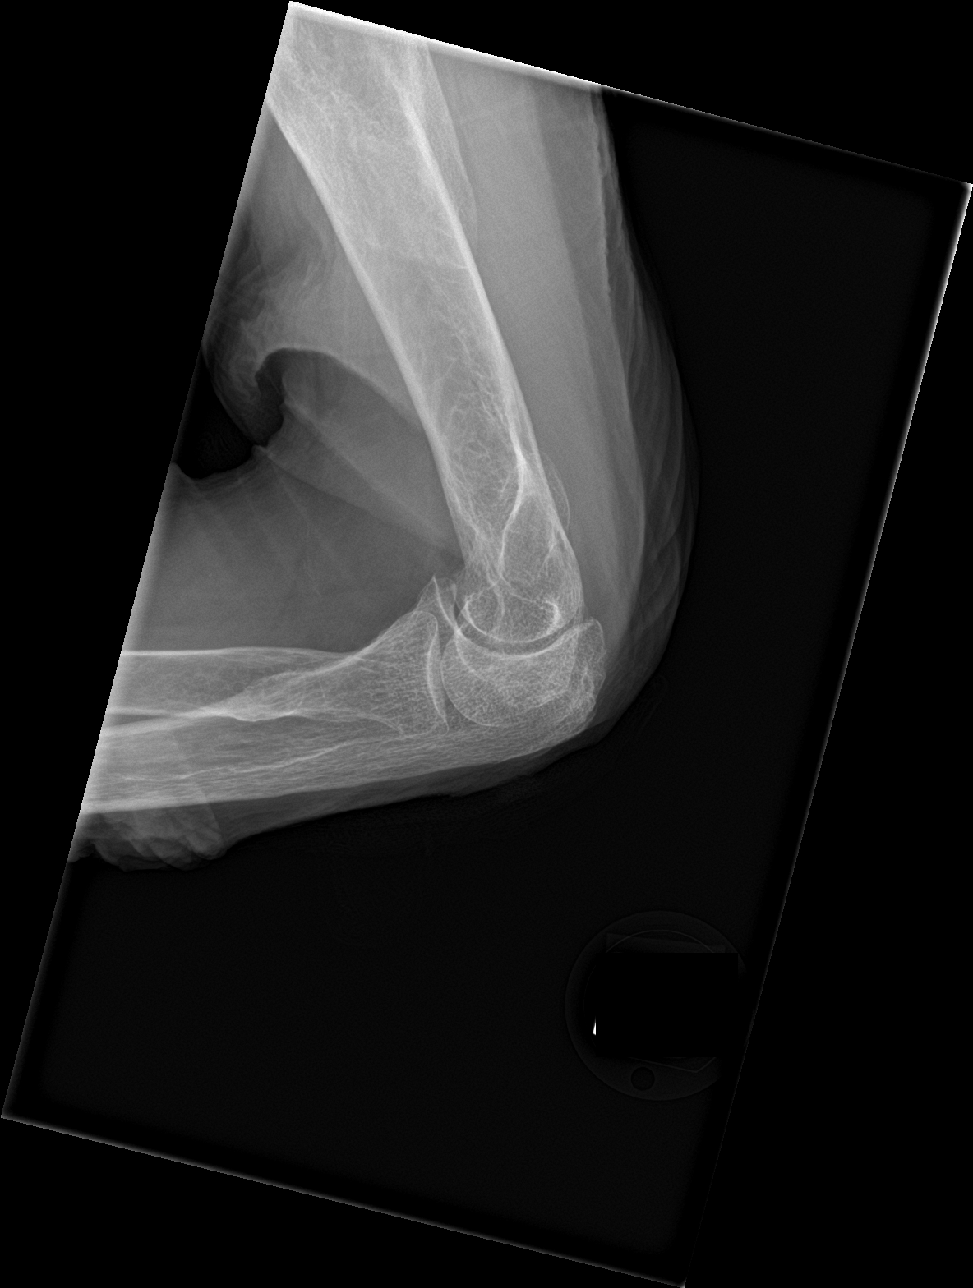
[im 5/5]
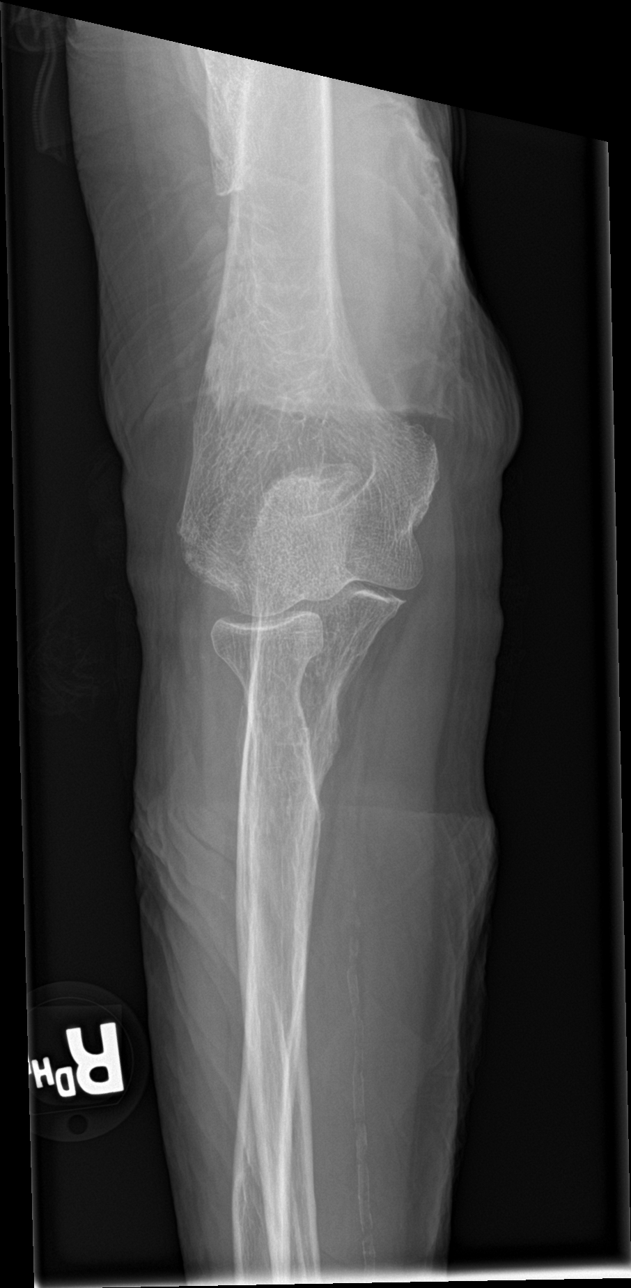

[5 of 5 positions shown; findings below may reference images not displayed]

FINDINGS: Five view radiograph of the right elbow demonstrates normal
alignment. No acute fracture or dislocation. Joint spaces are
preserved. No effusion. Healed right humeral diaphyseal fracture
with residual deformity is incompletely evaluated on this
examination. Soft tissues are unremarkable.
IMPRESSION: No acute fracture or dislocation.

## 2019-11-25 DIAGNOSIS — M47812 Spondylosis without myelopathy or radiculopathy, cervical region: Secondary | ICD-10-CM | POA: Diagnosis not present

## 2019-12-15 DIAGNOSIS — R42 Dizziness and giddiness: Secondary | ICD-10-CM | POA: Diagnosis not present

## 2019-12-15 DIAGNOSIS — I10 Essential (primary) hypertension: Secondary | ICD-10-CM | POA: Diagnosis not present

## 2019-12-15 DIAGNOSIS — H9202 Otalgia, left ear: Secondary | ICD-10-CM | POA: Diagnosis not present

## 2019-12-15 DIAGNOSIS — J439 Emphysema, unspecified: Secondary | ICD-10-CM | POA: Diagnosis not present

## 2019-12-15 DIAGNOSIS — M5412 Radiculopathy, cervical region: Secondary | ICD-10-CM | POA: Diagnosis not present

## 2019-12-15 DIAGNOSIS — J8410 Pulmonary fibrosis, unspecified: Secondary | ICD-10-CM | POA: Diagnosis not present

## 2019-12-15 DIAGNOSIS — I2510 Atherosclerotic heart disease of native coronary artery without angina pectoris: Secondary | ICD-10-CM | POA: Diagnosis not present

## 2019-12-15 DIAGNOSIS — I341 Nonrheumatic mitral (valve) prolapse: Secondary | ICD-10-CM | POA: Diagnosis not present

## 2019-12-15 DIAGNOSIS — I350 Nonrheumatic aortic (valve) stenosis: Secondary | ICD-10-CM | POA: Diagnosis not present

## 2019-12-15 DIAGNOSIS — R55 Syncope and collapse: Secondary | ICD-10-CM | POA: Diagnosis not present

## 2019-12-15 DIAGNOSIS — I480 Paroxysmal atrial fibrillation: Secondary | ICD-10-CM | POA: Diagnosis not present

## 2019-12-15 DIAGNOSIS — H903 Sensorineural hearing loss, bilateral: Secondary | ICD-10-CM | POA: Diagnosis not present

## 2019-12-15 DIAGNOSIS — G894 Chronic pain syndrome: Secondary | ICD-10-CM | POA: Diagnosis not present

## 2020-01-19 DIAGNOSIS — J4540 Moderate persistent asthma, uncomplicated: Secondary | ICD-10-CM | POA: Diagnosis not present

## 2020-01-26 DIAGNOSIS — M5412 Radiculopathy, cervical region: Secondary | ICD-10-CM | POA: Diagnosis not present

## 2020-01-26 DIAGNOSIS — G894 Chronic pain syndrome: Secondary | ICD-10-CM | POA: Diagnosis not present

## 2020-01-26 DIAGNOSIS — M542 Cervicalgia: Secondary | ICD-10-CM | POA: Diagnosis not present

## 2020-01-29 ENCOUNTER — Other Ambulatory Visit: Payer: Self-pay | Admitting: Physician Assistant

## 2020-01-29 DIAGNOSIS — I10 Essential (primary) hypertension: Secondary | ICD-10-CM

## 2020-01-29 DIAGNOSIS — E782 Mixed hyperlipidemia: Secondary | ICD-10-CM

## 2020-01-29 DIAGNOSIS — F3289 Other specified depressive episodes: Secondary | ICD-10-CM

## 2020-02-07 ENCOUNTER — Telehealth: Payer: Self-pay

## 2020-02-07 DIAGNOSIS — R519 Headache, unspecified: Secondary | ICD-10-CM

## 2020-02-07 DIAGNOSIS — G8929 Other chronic pain: Secondary | ICD-10-CM

## 2020-02-07 NOTE — Telephone Encounter (Signed)
Referral placed.

## 2020-02-07 NOTE — Telephone Encounter (Signed)
Copied from [place] [phone]. Topic: General - Inquiry >> Feb 07, 2020  2:11 PM Greggory Keen D wrote: Reason for CRM: pt niece Oren Section called asking if Fabio Bering would refer her aunt to [place]. No particular doctor. She has went to ortho, dentist, ENT and still having headaches   Their number is [phone] Niece's number [phone]

## 2020-02-08 NOTE — Telephone Encounter (Signed)
Patient's niece Marilyn Oconnell was advised of referral been placed.

## 2020-02-14 ENCOUNTER — Other Ambulatory Visit: Payer: Self-pay | Admitting: Physician Assistant

## 2020-02-14 DIAGNOSIS — I10 Essential (primary) hypertension: Secondary | ICD-10-CM

## 2020-02-14 DIAGNOSIS — F3289 Other specified depressive episodes: Secondary | ICD-10-CM

## 2020-02-14 DIAGNOSIS — E782 Mixed hyperlipidemia: Secondary | ICD-10-CM

## 2020-02-15 ENCOUNTER — Ambulatory Visit (INDEPENDENT_AMBULATORY_CARE_PROVIDER_SITE_OTHER): Payer: Medicare Other | Admitting: Physician Assistant

## 2020-02-15 DIAGNOSIS — R519 Headache, unspecified: Secondary | ICD-10-CM

## 2020-02-15 DIAGNOSIS — G8929 Other chronic pain: Secondary | ICD-10-CM | POA: Diagnosis not present

## 2020-02-15 NOTE — Progress Notes (Signed)
Virtual telephone visit    Virtual Visit via Telephone Note   This visit type was conducted due to national recommendations for restrictions regarding the COVID-19 Pandemic (e.g. social distancing) in an effort to limit this patient's exposure and mitigate transmission in our community. Due to her co-morbid illnesses, this patient is at least at moderate risk for complications without adequate follow up. This format is felt to be most appropriate for this patient at this time. The patient did not have access to video technology or had technical difficulties with video requiring transitioning to audio format only (telephone). Physical exam was limited to content and character of the telephone converstion.    Patient location: Home Provider location: Office   Visit Date: 02/15/2020  Today's healthcare provider: Trinna Post, PA-C   Chief Complaint  Patient presents with  . Headache  I,Marilyn Oconnell,acting as a scribe for Trinna Post, PA-C.,have documented all relevant documentation on the behalf of Trinna Post, PA-C,as directed by  Trinna Post, PA-C while in the presence of Trinna Post, PA-C.  Subjective    Headache  This is a recurrent problem. The current episode started more than 1 month ago. The problem occurs daily. The pain is located in the left unilateral region. The pain radiates to the left neck and left shoulder. The quality of the pain is described as shooting. The pain is at a severity of 6/10. The pain is moderate. Pertinent negatives include no blurred vision, hearing loss, insomnia, loss of balance, nausea, numbness, tingling, visual change, vomiting or weakness. She has tried acetaminophen for the symptoms.   Reports she has left sided pain in her shoulder that is radiating into her neck and into her left ear. She has had this pain since a fall several months ago and she had her humerus surgically fixated. [facility] a  neurosurgeon where she was diagnosed with cervical radiculopathy. She had Mobic. She had a steroid injection in her neck but unsure if ESI or trigger point. She was seen at [facility] in [facility]. Patient reports injection made pain worse and so sought care from a dentist to have bad teeth pulled. Niece reports patient has lost weight and is currently 119 pounds because she is scared to move and cause head pain. She was also seen by ENT Dr. Richardson Landry at [facility]. He thought it was her teeth. She does not have vertigo. She continues to have headaches.       Medications: Outpatient Medications Prior to Visit  Medication Sig  . acetaminophen (TYLENOL) 500 MG tablet Take 500-1,000 mg by mouth 2 (two) times daily as needed for mild pain, moderate pain, fever or headache.   . albuterol (PROVENTIL) (2.5 MG/3ML) 0.083% nebulizer solution Take 2.5 mg by nebulization every 6 (six) hours as needed for wheezing or shortness of breath.  . allopurinol (ZYLOPRIM) 100 MG tablet TAKE 1 TABLET BY MOUTH ONCE DAILY FOR GOUT.  Marland Kitchen aspirin EC 81 MG tablet Take 81 mg by mouth daily.  Marland Kitchen escitalopram (LEXAPRO) 20 MG tablet TAKE 1 TABLET BY MOUTH  DAILY  . fluticasone (FLONASE) 50 MCG/ACT nasal spray Place into the nose.  Marland Kitchen Fluticasone-Salmeterol (ADVAIR DISKUS) 250-50 MCG/DOSE AEPB USE ONE (1) INHALATION BY MOUTH EVERY 12 HOURS. RINSE MOUTH OUT AFTERUSE.  . furosemide (LASIX) 20 MG tablet TAKE 1 TABLET BY MOUTH  DAILY  . ipratropium-albuterol (DUONEB) 0.5-2.5 (3) MG/3ML SOLN Take 3 mLs by nebulization 3 (three)  times daily.  . metoprolol succinate (TOPROL-XL) 50 MG 24 hr tablet Take 1 tablet (50 mg total) by mouth daily. Take with or immediately following a meal.  . oxyCODONE-acetaminophen (PERCOCET/ROXICET) 5-325 MG tablet Take 1-2 tablets by mouth every 4 (four) hours as needed for severe pain.  . simvastatin (ZOCOR) 20 MG tablet TAKE 1 TABLET BY MOUTH AT  BEDTIME  . traMADol (ULTRAM) 50 MG tablet every 6 (six) hours    . traZODone (DESYREL) 50 MG tablet TAKE ONE-HALF TABLET BY  MOUTH AT BEDTIME   No facility-administered medications prior to visit.    Review of Systems  HENT: Negative for hearing loss.   Eyes: Negative for blurred vision.  Gastrointestinal: Negative for nausea and vomiting.  Neurological: Positive for headaches. Negative for tingling, weakness, numbness and loss of balance.  Psychiatric/Behavioral: The patient does not have insomnia.       Objective    There were no vitals taken for this visit.     Assessment & Plan    1. Chronic nonintractable headache, unspecified headache type  Did discuss possible interventions including muscle relaxer or gabapentin but these all come with significant sedation risks and considering that she had a fall which resulted in a broken humerus and is growing increasingly frail, I think this poses a large threat to her health. Will proceed with referral to [facility] and pain management.   - Ambulatory referral to Pain Clinic    No follow-ups on file.    I discussed the assessment and treatment plan with the patient. The patient was provided an opportunity to ask questions and all were answered. The patient agreed with the plan and demonstrated an understanding of the instructions.   The patient was advised to call back or seek an in-person evaluation if the symptoms worsen or if the condition fails to improve as anticipated.  I provided 21 minutes of non-face-to-face time during this encounter.  ITrinna Post, PA-C, have reviewed all documentation for this visit. The documentation on 02/15/20 for the exam, diagnosis, procedures, and orders are all accurate and complete.   Paulene Floor [facility] [phone] (phone) [phone] (fax)  [place]

## 2020-03-06 ENCOUNTER — Telehealth: Payer: Self-pay

## 2020-03-06 NOTE — Telephone Encounter (Signed)
Called patient's niece Sharyn Lull and left a voicemail to return call to clarify who Benn Moulder is due to her not been listed on patients DPR or as a contact personnel for patient. If Sharyn Lull calls back okay for [facility] to transfer the call.

## 2020-03-06 NOTE — Telephone Encounter (Signed)
Returned patient's niece call and she was advised that referrals has been placed for the patient and her contact information was left for the pain management to contact her for appointment for patient. Also I gave Sharyn Lull the [facility] pain management and [place] [facility] as well to contact them to schedule patient appointment.

## 2020-03-06 NOTE — Telephone Encounter (Signed)
Copied from [place] [phone]. Topic: General - Other >> Mar 06, 2020  2:14 PM Alanda Slim E wrote: Reason for CRM: Pt didn't understand the information about her headache referral and [name] Beam called to get clarity for the Pt/ [name] is the pts Education officer, museum and home care provider / please advise

## 2020-03-16 ENCOUNTER — Encounter: Payer: Self-pay | Admitting: Student in an Organized Health Care Education/Training Program

## 2020-03-16 ENCOUNTER — Other Ambulatory Visit: Payer: Self-pay

## 2020-03-16 ENCOUNTER — Ambulatory Visit
Payer: Medicare Other | Attending: Student in an Organized Health Care Education/Training Program | Admitting: Student in an Organized Health Care Education/Training Program

## 2020-03-16 VITALS — BP 183/50 | HR 67 | Temp 98.2°F | Ht 60.0 in | Wt 124.0 lb

## 2020-03-16 DIAGNOSIS — R519 Headache, unspecified: Secondary | ICD-10-CM | POA: Diagnosis not present

## 2020-03-16 DIAGNOSIS — S42215A Unspecified nondisplaced fracture of surgical neck of left humerus, initial encounter for closed fracture: Secondary | ICD-10-CM | POA: Insufficient documentation

## 2020-03-16 DIAGNOSIS — G894 Chronic pain syndrome: Secondary | ICD-10-CM | POA: Diagnosis not present

## 2020-03-16 DIAGNOSIS — M5481 Occipital neuralgia: Secondary | ICD-10-CM | POA: Diagnosis not present

## 2020-03-16 DIAGNOSIS — S42215S Unspecified nondisplaced fracture of surgical neck of left humerus, sequela: Secondary | ICD-10-CM

## 2020-03-16 DIAGNOSIS — G8929 Other chronic pain: Secondary | ICD-10-CM

## 2020-03-16 NOTE — Patient Instructions (Signed)
____________________________________________________________________________________________  General Risks and Possible Complications  Patient Responsibilities: It is important that you read this as it is part of your informed consent. It is our duty to inform you of the risks and possible complications associated with treatments offered to you. It is your responsibility as a patient to read this and to ask questions about anything that is not clear or that you believe was not covered in this document.  Patient's Rights: You have the right to refuse treatment. You also have the right to change your mind, even after initially having agreed to have the treatment done. However, under this last option, if you wait until the last second to change your mind, you may be charged for the materials used up to that point.  Introduction: Medicine is not an exact science. Everything in Medicine, including the lack of treatment(s), carries the potential for danger, harm, or loss (which is by definition: Risk). In Medicine, a complication is a secondary problem, condition, or disease that can aggravate an already existing one. All treatments carry the risk of possible complications. The fact that a side effects or complications occurs, does not imply that the treatment was conducted incorrectly. It must be clearly understood that these can happen even when everything is done following the highest safety standards.  No treatment: You can choose not to proceed with the proposed treatment alternative. The "PRO(s)" would include: avoiding the risk of complications associated with the therapy. The "CON(s)" would include: not getting any of the treatment benefits. These benefits fall under one of three categories: diagnostic; therapeutic; and/or palliative. Diagnostic benefits include: getting information which can ultimately lead to improvement of the disease or symptom(s). Therapeutic benefits are those associated with the  successful treatment of the disease. Finally, palliative benefits are those related to the decrease of the primary symptoms, without necessarily curing the condition (example: decreasing the pain from a flare-up of a chronic condition, such as incurable terminal cancer).  General Risks and Complications: These are associated to most interventional treatments. They can occur alone, or in combination. They fall under one of the following six (6) categories: no benefit or worsening of symptoms; bleeding; infection; nerve damage; allergic reactions; and/or death. 1. No benefits or worsening of symptoms: In Medicine there are no guarantees, only probabilities. No healthcare provider can ever guarantee that a medical treatment will work, they can only state the probability that it may. Furthermore, there is always the possibility that the condition may worsen, either directly, or indirectly, as a consequence of the treatment. 2. Bleeding: This is more common if the patient is taking a blood thinner, either prescription or over the counter (example: Goody Powders, Fish oil, Aspirin, Garlic, etc.), or if suffering a condition associated with impaired coagulation (example: Hemophilia, cirrhosis of the liver, low platelet counts, etc.). However, even if you do not have one on these, it can still happen. If you have any of these conditions, or take one of these drugs, make sure to notify your treating physician. 3. Infection: This is more common in patients with a compromised immune system, either due to disease (example: diabetes, cancer, human immunodeficiency virus [HIV], etc.), or due to medications or treatments (example: therapies used to treat cancer and rheumatological diseases). However, even if you do not have one on these, it can still happen. If you have any of these conditions, or take one of these drugs, make sure to notify your treating physician. 4. Nerve Damage: This is more common when the   treatment is  an invasive one, but it can also happen with the use of medications, such as those used in the treatment of cancer. The damage can occur to small secondary nerves, or to large primary ones, such as those in the spinal cord and brain. This damage may be temporary or permanent and it may lead to impairments that can range from temporary numbness to permanent paralysis and/or brain death. 5. Allergic Reactions: Any time a substance or material comes in contact with our body, there is the possibility of an allergic reaction. These can range from a mild skin rash (contact dermatitis) to a severe systemic reaction (anaphylactic reaction), which can result in death. 6. Death: In general, any medical intervention can result in death, most of the time due to an unforeseen complication. ____________________________________________________________________________________________  ____________________________________________________________________________________________  Preparing for your procedure (without sedation)  Procedure appointments are limited to planned procedures: . No Prescription Refills. . No disability issues will be discussed. . No medication changes will be discussed.  Instructions: . Oral Intake: Do not eat or drink anything for at least 6 hours prior to your procedure. (Exception: Blood Pressure Medication. See below.) . Transportation: Unless otherwise stated by your physician, you may drive yourself after the procedure. . Blood Pressure Medicine: Do not forget to take your blood pressure medicine with a sip of water the morning of the procedure. If your Diastolic (lower reading)is above 100 mmHg, elective cases will be cancelled/rescheduled. . Blood thinners: These will need to be stopped for procedures. Notify our staff if you are taking any blood thinners. Depending on which one you take, there will be specific instructions on how and when to stop it. . Diabetics on insulin: Notify  the staff so that you can be scheduled 1st case in the morning. If your diabetes requires high dose insulin, take only  of your normal insulin dose the morning of the procedure and notify the staff that you have done so. . Preventing infections: Shower with an antibacterial soap the morning of your procedure.  . Build-up your immune system: Take 1000 mg of Vitamin C with every meal (3 times a day) the day prior to your procedure. . Antibiotics: Inform the staff if you have a condition or reason that requires you to take antibiotics before dental procedures. . Pregnancy: If you are pregnant, call and cancel the procedure. . Sickness: If you have a cold, fever, or any active infections, call and cancel the procedure. . Arrival: You must be in the facility at least 30 minutes prior to your scheduled procedure. . Children: Do not bring any children with you. . Dress appropriately: Bring dark clothing that you would not mind if they get stained. . Valuables: Do not bring any jewelry or valuables.  Reasons to call and reschedule or cancel your procedure: (Following these recommendations will minimize the risk of a serious complication.) . Surgeries: Avoid having procedures within 2 weeks of any surgery. (Avoid for 2 weeks before or after any surgery). . Flu Shots: Avoid having procedures within 2 weeks of a flu shots or . (Avoid for 2 weeks before or after immunizations). . Barium: Avoid having a procedure within 7-10 days after having had a radiological study involving the use of radiological contrast. (Myelograms, Barium swallow or enema study). . Heart attacks: Avoid any elective procedures or surgeries for the initial 6 months after a "Myocardial Infarction" (Heart Attack). . Blood thinners: It is imperative that you stop these medications before procedures. Let us   know if you if you take any blood thinner.  . Infection: Avoid procedures during or within two weeks of an infection (including chest  colds or gastrointestinal problems). Symptoms associated with infections include: Localized redness, fever, chills, night sweats or profuse sweating, burning sensation when voiding, cough, congestion, stuffiness, runny nose, sore throat, diarrhea, nausea, vomiting, cold or Flu symptoms, recent or current infections. It is specially important if the infection is over the area that we intend to treat. Marland Kitchen Heart and lung problems: Symptoms that may suggest an active cardiopulmonary problem include: cough, chest pain, breathing difficulties or shortness of breath, dizziness, ankle swelling, uncontrolled high or unusually low blood pressure, and/or palpitations. If you are experiencing any of these symptoms, cancel your procedure and contact your primary care physician for an evaluation.  Remember:  Regular Business hours are:  Monday to Thursday 8:00 AM to 4:00 PM  Provider's Schedule: Milinda Pointer, MD:  Procedure days: Tuesday and Thursday 7:30 AM to 4:00 PM  Gillis Santa, MD:  Procedure days: Monday and Wednesday 7:30 AM to 4:00 PM ____________________________________________________________________________________________  Occipital Nerve Block Patient Information  Description: The occipital nerves originate in the cervical (neck) spinal cord and travel upward through muscle and tissue to supply sensation to the back of the head and top of the scalp.  In addition, the nerves control some of the muscles of the scalp.  Occipital neuralgia is an irritation of these nerves which can cause headaches, numbness of the scalp, and neck discomfort.     The occipital nerve block will interrupt nerve transmission through these nerves and can relieve pain and spasm.  The block consists of insertion of a small needle under the skin in the back of the head to deposit local anesthetic (numbing medicine) and/or steroids around the nerve.  The entire block usually lasts less than 5 minutes.  Conditions which  may be treated by occipital blocks:   Muscular pain and spasm of the scalp  Nerve irritation, back of the head  Headaches  Upper neck pain  Preparation for the injection:  1. Do not eat any solid food or dairy products within 8 hours of your appointment. 2. You may drink clear liquids up to 3 hours before appointment.  Clear liquids include water, black coffee, juice or soda.  No milk or cream please. 3. You may take your regular medication, including pain medications, with a sip of water before you appointment.  Diabetics should hold regular insulin (if taken separately) and take 1/2 normal NPH dose the morning of the procedure.  Carry some sugar containing items with you to your appointment. 4. A driver must accompany you and be prepared to drive you home after your procedure. 5. Bring all your current medications with you. 6. An IV may be inserted and sedation may be given at the discretion of the physician. 7. A blood pressure cuff, EKG, and other monitors will often be applied during the procedure.  Some patients may need to have extra oxygen administered for a short period. 8. You will be asked to provide medical information, including your allergies and medications, prior to the procedure.  We must know immediately if you are taking blood thinners (like Coumadin/Warfarin) or if you are allergic to IV iodine contrast (dye).  We must know if you could possible be pregnant.  9. Do not wear a high collared shirt or turtleneck.  Tie long hair up in the back if possible.  Possible side-effects:   Bleeding from needle  site  Infection (rare, may require surgery)  Nerve injury (rare)  Hair on back of neck can be tinged with iodine scrub (this will wash out)  Light-headedness (temporary)  Pain at injection site (several days)  Decreased blood pressure (rare, temporary)  Seizure (very rare)  Call if you experience:   Hives or difficulty breathing ( go to the emergency  room)  Inflammation or drainage at the injection site(s)  Please note:  Although the local anesthetic injected can often make your painful muscles or headache feel good for several hours after the injection, the pain may return.  It takes 3-7 days for steroids to work.  You may not notice any pain relief for at least one week.  If effective, we will often do a series of injections spaced 3-6 weeks apart to maximally decrease your pain.  If you have any questions, please call [phone] [facility]

## 2020-03-16 NOTE — Progress Notes (Signed)
Patient: Marilyn Oconnell  Service Category: E/M  Provider: Gillis Santa, MD  DOB: 07-Sep-1924  DOS: 03/16/2020  Referring Provider: Paulene Floor  MRN: [id number]  Setting: Ambulatory outpatient  PCP: Trinna Post, PA-C  Type: New Patient  Specialty: Interventional Pain Management    Location: Office  Delivery: Face-to-face     Primary Reason(s) for Visit: Encounter for initial evaluation of one or more chronic problems (new to examiner) potentially causing chronic pain, and posing a threat to normal musculoskeletal function. (Level of risk: High) CC: Neck Pain  HPI  [name] is a 86 y.o. year old, female patient, who comes today to see [place] for the first time for an initial evaluation of her chronic pain. She has Hyperlipemia; Avitaminosis D; LBP (low back pain); BP (high blood pressure); Atherosclerosis of coronary artery; Adaptation reaction; Aortic heart valve narrowing; Bilateral cataracts; Cardiac murmur; Chronic kidney disease (CKD), stage III (moderate); History of gout; Degeneration macular; OP (osteoporosis); H/O aortic valve replacement; Fibrosis of lung ([place]); Billowing mitral valve; Asthma-chronic obstructive pulmonary disease overlap syndrome ([place]); Paroxysmal atrial fibrillation with RVR ([place]); Depression; Vertigo; Insomnia; Visual hallucinations; Recurrent syncope; Syncope; Chronic pain syndrome; Closed nondisplaced fracture of surgical neck of left humerus; Chronic left-sided headaches; and Occipital neuralgia of left side on their problem list. Today she comes in for evaluation of her Neck Pain  Pain Assessment: Location: Left Neck Radiating: pain radiaties up neck to top of head Onset: More than a month ago Duration: Chronic pain Quality: Aching, Shooting Severity: 3 /10 (subjective, self-reported pain score)  Note: Reported level is compatible with observation.                         When using our objective Pain Scale, levels between 6 and 10/10 are said to belong  in an emergency room, as it progressively worsens from a 6/10, described as severely limiting, requiring emergency care not usually available at an outpatient pain management facility. At a 6/10 level, communication becomes difficult and requires great effort. Assistance to reach the emergency department may be required. Facial flushing and profuse sweating along with potentially dangerous increases in heart rate and blood pressure will be evident. Effect on ADL: limits my daily activities Timing: Intermittent Modifying factors: sitting, meds BP: (!) 183/50  HR: 67  Onset and Duration: Sudden, Started with accident and Date of onset: 6 months ago Cause of pain: fall Severity: Getting better, NAS-11 at its worse: 10/10, NAS-11 at its best: 0/10, NAS-11 now: 3/10 and NAS-11 on the average: 3/10 Timing: Not influenced by the time of the day and After activity or exercise Aggravating Factors: Bending and Walking Alleviating Factors: Sitting Associated Problems: Pain that wakes patient up Quality of Pain: Aching Previous Examinations or Tests: CT scan and X-rays Previous Treatments: The patient denies , not listed  The patient comes into the clinics today for the first time for a chronic pain management evaluation.   Patient is a very pleasant 86 year old female who presents with a chief complaint of left neck pain posterior to her sternocleidomastoid that radiates superiorly and posteriorly along her left scalp and to the top of her occiput.  She also has occasional radiation into her left ear.  This pain started after a fall several months ago where she sustained a left humeral head fracture resulting in surgical fixation.  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  She is on  Mobic for that.  This was prescribed by Dr. Cristy Folks with [facility].  Most of the history was obtained from the patient's caregiver that has accompanied her.  Patient has difficulty hearing.   She does endorse a posterior dominant headaches that start in the left side of her mastoid process that radiates superiorly.  Meds   Current Outpatient Medications:  .  acetaminophen (TYLENOL) 500 MG tablet, Take 500-1,000 mg by mouth 2 (two) times daily as needed for mild pain, moderate pain, fever or headache. , Disp: , Rfl:  .  albuterol (PROVENTIL) (2.5 MG/3ML) 0.083% nebulizer solution, Take 2.5 mg by nebulization every 6 (six) hours as needed for wheezing or shortness of breath., Disp: , Rfl:  .  aspirin EC 81 MG tablet, Take 81 mg by mouth daily., Disp: , Rfl:  .  escitalopram (LEXAPRO) 20 MG tablet, TAKE 1 TABLET BY MOUTH  DAILY, Disp: 90 tablet, Rfl: 3 .  Fluticasone-Salmeterol (ADVAIR DISKUS) 250-50 MCG/DOSE AEPB, USE ONE (1) INHALATION BY MOUTH EVERY 12 HOURS. RINSE MOUTH OUT AFTERUSE., Disp: 180 each, Rfl: 3 .  furosemide (LASIX) 20 MG tablet, TAKE 1 TABLET BY MOUTH  DAILY, Disp: 90 tablet, Rfl: 3 .  ipratropium-albuterol (DUONEB) 0.5-2.5 (3) MG/3ML SOLN, Take 3 mLs by nebulization 3 (three) times daily., Disp: , Rfl:  .  meloxicam (MOBIC) 15 MG tablet, Take 15 mg by mouth. , Disp: , Rfl:  .  metoprolol succinate (TOPROL-XL) 50 MG 24 hr tablet, Take 1 tablet (50 mg total) by mouth daily. Take with or immediately following a meal., Disp: 90 tablet, Rfl: 3 .  simvastatin (ZOCOR) 20 MG tablet, TAKE 1 TABLET BY MOUTH AT  BEDTIME, Disp: 90 tablet, Rfl: 3 .  traZODone (DESYREL) 50 MG tablet, TAKE ONE-HALF TABLET BY  MOUTH AT BEDTIME, Disp: 45 tablet, Rfl: 3 .  fluticasone (FLONASE) 50 MCG/ACT nasal spray, Place into the nose., Disp: , Rfl:  .  traMADol (ULTRAM) 50 MG tablet, every 6 (six) hours (Patient not taking: Reported on 03/16/2020), Disp: , Rfl:   Imaging Review     DG Shoulder Right  Narrative CLINICAL DATA:  Recent fall with right arm pain, initial encounter  EXAM: RIGHT SHOULDER - 2+ VIEW  COMPARISON:  None.  FINDINGS: Right shoulder prosthesis is noted. There is a  spiral fracture identified just distal to the tip of the prosthesis with mild displacement identified. No definitive periprosthetic fracture is seen.  IMPRESSION: Spiral fracture in the midshaft of the right humerus just below the known shoulder prosthesis   Electronically Signed By: Inez Catalina M.D. On: 07/19/2019 19:05  DG Lumbar Spine 2-3 Views  Narrative CLINICAL DATA:  Status post fall  EXAM: LUMBAR SPINE - 2-3 VIEW  COMPARISON:  Lumbar spine plain film dated 06/14/2010.  FINDINGS: There is a mild wedge compression deformity of the L3 vertebral body which is of uncertain age but not seen on the previous exam, possibly accentuated by patient positioning. Mild compression deformity of the L1 vertebral body is grossly stable. Questionable minimal compression deformity involving the superior endplate of the L2 vertebral body is also of uncertain age but not seen on the previous exam, again possibly accentuated by patient positioning. Based on the AP projection, there is no significant change except for increased dextroscoliosis of the lumbar spine which is likely contributing to the appearance on the lateral view.  No fracture line or displaced fracture fragment is seen. Slightly increased dextroscoliosis of the lumbar spine, as detailed above. Overall alignment otherwise  unchanged. Posterior elements appear normally aligned. Upper sacrum appears intact and normally aligned, although difficult to definitively characterize due to overlying bowel gas.  Calcifications within the lower pelvis are stable, presumed calcified uterine fibroids. Visualized paravertebral and intrapelvic soft tissues are otherwise unremarkable.  IMPRESSION: 1. Mild wedge compression deformities of the L1 through L3 vertebral bodies, likely accentuated by patient positioning and/or increased dextroscoliosis on the lateral view, not significantly changed in appearance on the AP view, each  favored to be chronic. 2. Overall, no acute-appearing abnormality seen.   Electronically Signed By: Franki Cabot M.D. On: 01/20/2016 11:43    DG HIP UNILAT WITH PELVIS 2-3 VIEWS LEFT  Narrative CLINICAL DATA:  Left groin pain.  Prior falls.  EXAM: DG HIP (WITH OR WITHOUT PELVIS) 2-3V LEFT  COMPARISON:  01/20/2016.  FINDINGS: Diffuse osteopenia. Degenerative changes lumbar spine and both hips. No acute bony abnormality. Calcified uterine fibroid. Peripheral vascular calcification.  IMPRESSION: 1. Diffuse osteopenia. Degenerative changes lumbar spine and both hips. No acute bony abnormality.  2. Calcified uterine fibroid.  3. Peripheral vascular disease.   Electronically Signed By: Marcello Moores  Register On: 01/14/2017 13:44    Hand Imaging: Hand-R DG Complete: Results for orders placed during the hospital encounter of 01/20/16  DG Hand Complete Right  Narrative CLINICAL DATA:  86 year old female with a history of fall and hand pain  EXAM: RIGHT HAND - COMPLETE 3+ VIEW  COMPARISON:  None.  FINDINGS: Diffuse osteopenia.  No displaced fracture identified.  Osteoarthritis of the wrist, first metacarpophalangeal joint, and the phalangeal joints of the hand.  No radiopaque foreign body.  IMPRESSION: Negative for acute bony abnormality.  Osteoarthritis of the wrist, interphalangeal joints, and first carpometacarpal joint.  Signed,  Dulcy Fanny. Earleen Newport, DO  [facility]  [facility]   Electronically Signed By: Corrie Mckusick D.O. On: 01/20/2016 11:45  Hand-L DG Complete: No results found for this or any previous visit.   Complexity Note: Imaging results reviewed. Results shared with [name], using Layman's terms.                         ROS  Cardiovascular: Weak heart (CHF), Heart murmur and Heart valve problems Pulmonary or Respiratory: Wheezing and difficulty taking a deep full breath  (Asthma) Neurological: No reported neurological signs or symptoms such as seizures, abnormal skin sensations, urinary and/or fecal incontinence, being born with an abnormal open spine and/or a tethered spinal cord Psychological-Psychiatric: No reported psychological or psychiatric signs or symptoms such as difficulty sleeping, anxiety, depression, delusions or hallucinations (schizophrenial), mood swings (bipolar disorders) or suicidal ideations or attempts Gastrointestinal: No reported gastrointestinal signs or symptoms such as vomiting or evacuating blood, reflux, heartburn, alternating episodes of diarrhea and constipation, inflamed or scarred liver, or pancreas or irrregular and/or infrequent bowel movements Genitourinary: No reported renal or genitourinary signs or symptoms such as difficulty voiding or producing urine, peeing blood, non-functioning kidney, kidney stones, difficulty emptying the bladder, difficulty controlling the flow of urine, or chronic kidney disease Hematological: No reported hematological signs or symptoms such as prolonged bleeding, low or poor functioning platelets, bruising or bleeding easily, hereditary bleeding problems, low energy levels due to low hemoglobin or being anemic Endocrine: No reported endocrine signs or symptoms such as high or low blood sugar, rapid heart rate due to high thyroid levels, obesity or weight gain due to slow thyroid or thyroid disease Rheumatologic: Joint aches and or swelling due to excess  weight (Osteoarthritis) Musculoskeletal: Negative for myasthenia gravis, muscular dystrophy, multiple sclerosis or malignant hyperthermia Work History: Retired  Allergies  Ms. Marek is allergic to iodine, shellfish allergy, azithromycin, and sulfa antibiotics.  Laboratory Chemistry Profile   Renal Lab Results  Component Value Date   BUN 25 (H) 07/20/2019   CREATININE 1.08 (H) 07/20/2019   BCR 17 02/26/2019   GFRAA 51 (L) 07/20/2019   GFRNONAA 44  (L) 07/20/2019   PROTEINUR NEGATIVE 07/19/2019     Electrolytes Lab Results  Component Value Date   NA 139 07/20/2019   K 4.0 07/20/2019   CL 103 07/20/2019   CALCIUM 9.1 07/20/2019   PHOS 3.5 12/26/2017     Hepatic Lab Results  Component Value Date   AST 16 02/26/2019   ALT 8 02/26/2019   ALBUMIN 4.7 (H) 02/26/2019   ALKPHOS 75 02/26/2019   LIPASE 25 11/27/2015     ID Lab Results  Component Value Date   SARSCOV2NAA NEGATIVE 07/19/2019     Bone No results found for: [place], VD125OH2TOT, TJ0300PQ3, RA0762UQ3, 25OHVITD1, 25OHVITD2, 25OHVITD3, TESTOFREE, TESTOSTERONE   Endocrine Lab Results  Component Value Date   GLUCOSE 140 (H) 07/20/2019   GLUCOSEU NEGATIVE 07/19/2019   TSH 7.564 (H) 07/20/2019   FREET4 1.23 05/19/2017     Neuropathy No results found for: VITAMINB12, FOLATE, HGBA1C, HIV   CNS No results found for: COLORCSF, APPEARCSF, RBCCOUNTCSF, WBCCSF, POLYSCSF, LYMPHSCSF, EOSCSF, PROTEINCSF, GLUCCSF, JCVIRUS, CSFOLI, IGGCSF, LABACHR, ACETBL, LABACHR, ACETBL   Inflammation (CRP: Acute  ESR: Chronic) No results found for: CRP, ESRSEDRATE, LATICACIDVEN   Rheumatology Lab Results  Component Value Date   LABURIC 5.3 07/14/2018     Coagulation Lab Results  Component Value Date   INR 1.07 11/27/2015   LABPROT 14.1 11/27/2015   PLT 165 07/20/2019     Cardiovascular Lab Results  Component Value Date   BNP 245.6 (H) 11/22/2015   TROPONINI 0.06 (H) 11/28/2015   HGB 10.6 (L) 07/20/2019   HCT 32.1 (L) 07/20/2019     Screening Lab Results  Component Value Date   SARSCOV2NAA NEGATIVE 07/19/2019     Cancer No results found for: CEA, CA125, LABCA2   Allergens No results found for: ALMOND, APPLE, ASPARAGUS, AVOCADO, BANANA, BARLEY, BASIL, BAYLEAF, GREENBEAN, LIMABEAN, WHITEBEAN, BEEFIGE, REDBEET, BLUEBERRY, BROCCOLI, CABBAGE, MELON, CARROT, CASEIN, CASHEWNUT, CAULIFLOWER, CELERY     Note: Lab results reviewed.   [place]  Drug: Ms. Resh  reports no  history of drug use. Alcohol:  reports no history of alcohol use. Tobacco:  reports that she has never smoked. She has never used smokeless tobacco. Medical:  has a past medical history of Arthritis, CHF (congestive heart failure) ([place]), COPD (chronic obstructive pulmonary disease) ([place]), Coronary artery disease, Hypercholesteremia, and Hypertension. Family: family history includes Aneurysm in her father; Heart failure in her brother; Lung cancer in her brother; Prostate cancer in her brother.  Past Surgical History:  Procedure Laterality Date  . AORTIC VALVE REPLACEMENT  2006  . DILATION AND CURETTAGE OF UTERUS  1988  . SHOULDER SURGERY  09/1999  . SIGMOIDOSCOPY  1992   Active Ambulatory Problems    Diagnosis Date Noted  . Hyperlipemia 04/12/2015  . Avitaminosis D 07/11/2015  . LBP (low back pain) 07/11/2015  . BP (high blood pressure) 07/11/2015  . Atherosclerosis of coronary artery 09/08/2015  . Adaptation reaction 09/28/2015  . Aortic heart valve narrowing 09/28/2015  . Bilateral cataracts 09/28/2015  . Cardiac murmur 09/28/2015  . Chronic kidney disease (CKD), stage  III (moderate) 09/28/2015  . History of gout 09/28/2015  . 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/2015  . OP (osteoporosis) 09/28/2015  . H/O aortic valve replacement 11/09/2004  . Fibrosis of lung ([place]) 05/25/2014  . Billowing mitral valve 08/03/2014  . Asthma-chronic obstructive pulmonary disease overlap syndrome ([place]) 05/25/2014  . Paroxysmal atrial fibrillation with RVR ([place]) 11/27/2015  . Depression 11/27/2015  . Vertigo 03/15/2016  . Insomnia 03/18/2016  . Visual hallucinations 03/18/2016  . Recurrent syncope 07/19/2019  . Syncope 07/20/2019  . Chronic pain syndrome 03/16/2020  . Closed nondisplaced fracture of surgical neck of left humerus 03/16/2020  . Chronic left-sided headaches 03/16/2020  . Occipital neuralgia of left side 03/16/2020   Resolved Ambulatory Problems    Diagnosis Date Noted  . CAFL (chronic  airflow limitation) ([place]) 09/08/2015  . Bronchitis with chronic airway obstruction ([place]) 09/28/2015  . Subclinical hypothyroidism 09/28/2015  . Arteriosclerosis of coronary artery 08/03/2014  . Humerus fracture 11/21/2015  . Chest pain 11/27/2015  . Dyspnea 11/27/2015  . Elevated troponin 11/27/2015  . Constipation 11/27/2015  . Fracture of left humerus 11/27/2015  . Skin laceration 01/26/2016   Past Medical History:  Diagnosis Date  . Arthritis   . CHF (congestive heart failure) ([place])   . COPD (chronic obstructive pulmonary disease) ([place])   . Coronary artery disease   . Hypercholesteremia   . Hypertension    Constitutional Exam  General appearance: alert, cooperative and slowed mentation Vitals:   03/16/20 1319  BP: (!) 183/50  Pulse: 67  Temp: 98.2 F (36.8 C)  SpO2: 100%  Weight: 124 lb (56.2 kg)  Height: 5' (1.524 m)   BMI Assessment: Estimated body mass index is 24.22 kg/m as calculated from the following:   Height as of this encounter: 5' (1.524 m).   Weight as of this encounter: 124 lb (56.2 kg).  BMI interpretation table: BMI level Category Range association with higher incidence of chronic pain  <18 kg/m2 Underweight   18.5-24.9 kg/m2 Ideal body weight   25-29.9 kg/m2 Overweight Increased incidence by 20%  30-34.9 kg/m2 Obese (Class I) Increased incidence by 68%  35-39.9 kg/m2 Severe obesity (Class II) Increased incidence by 136%  >40 kg/m2 Extreme obesity (Class III) Increased incidence by 254%   Patient's current BMI Ideal Body weight  Body mass index is 24.22 kg/m. Ideal body weight: 45.5 kg (100 lb 4.9 oz) Adjusted ideal body weight: 49.8 kg (109 lb 12.6 oz)   BMI Readings from Last 4 Encounters:  03/16/20 24.22 kg/m  07/19/19 23.81 kg/m  02/26/19 23.24 kg/m  07/14/18 23.85 kg/m   Wt Readings from Last 4 Encounters:  03/16/20 124 lb (56.2 kg)  07/19/19 126 lb (57.2 kg)  02/26/19 123 lb (55.8 kg)  07/14/18 126 lb 3.2 oz (57.2 kg)     Psych/Mental status: Alert, oriented x 3 (person, place, & time)       Eyes: PERLA Respiratory: No evidence of acute respiratory distress  Cervical Spine Exam  Skin & Axial Inspection: No masses, redness, edema, swelling, or associated skin lesions Alignment: Symmetrical Functional ROM: Pain restricted ROM      Stability: No instability detected Muscle Tone/Strength: Functionally intact. No obvious neuro-muscular anomalies detected. Sensory (Neurological): Neurogenic pain pattern Palpation: Complains of area being tender to palpation              Upper Extremity (UE) Exam    Side: Right upper extremity  Side: Left upper extremity  Skin & Extremity Inspection: Skin color, temperature, and hair growth  are WNL. No peripheral edema or cyanosis. No masses, redness, swelling, asymmetry, or associated skin lesions. No contractures.  Skin & Extremity Inspection: Evidence of prior arthroplastic surgery  Functional ROM: Unrestricted ROM          Functional ROM: Pain restricted ROM          Muscle Tone/Strength: Functionally intact. No obvious neuro-muscular anomalies detected.  Muscle Tone/Strength: Functionally intact. No obvious neuro-muscular anomalies detected.  Sensory (Neurological): Unimpaired          Sensory (Neurological): Musculoskeletal pain pattern          Palpation: No palpable anomalies              Palpation: No palpable anomalies              Provocative Test(s):  Phalen's test: deferred Tinel's test: deferred Apley's scratch test (touch opposite shoulder):  Action 1 (Across chest): deferred Action 2 (Overhead): deferred Action 3 (LB reach): deferred   Provocative Test(s):  Phalen's test: deferred Tinel's test: deferred Apley's scratch test (touch opposite shoulder):  Action 1 (Across chest): deferred Action 2 (Overhead): deferred Action 3 (LB reach): deferred    Thoracic Spine Area Exam  Skin & Axial Inspection: No masses, redness, or swelling Alignment:  Symmetrical Functional ROM: Unrestricted ROM Stability: No instability detected Muscle Tone/Strength: Functionally intact. No obvious neuro-muscular anomalies detected. Sensory (Neurological): Unimpaired Muscle strength & Tone: No palpable anomalies  Lumbar Exam  Skin & Axial Inspection: No masses, redness, or swelling Alignment: Symmetrical Functional ROM: Decreased ROM       Stability: No instability detected Muscle Tone/Strength: Functionally intact. No obvious neuro-muscular anomalies detected. Sensory (Neurological): Myotome pain pattern  Gait & Posture Assessment  Ambulation: Patient came in today in a wheel chair Gait: Limited. Using assistive device to ambulate Posture: Difficulty standing up straight, due to pain   Lower Extremity Exam    Side: Right lower extremity  Side: Left lower extremity  Stability: No instability observed          Stability: No instability observed          Skin & Extremity Inspection: Skin color, temperature, and hair growth are WNL. No peripheral edema or cyanosis. No masses, redness, swelling, asymmetry, or associated skin lesions. No contractures.  Skin & Extremity Inspection: Skin color, temperature, and hair growth are WNL. No peripheral edema or cyanosis. No masses, redness, swelling, asymmetry, or associated skin lesions. No contractures.  Functional ROM: Decreased ROM for hip and knee joints          Functional ROM: Decreased ROM for hip and knee joints          Muscle Tone/Strength: Functionally intact. No obvious neuro-muscular anomalies detected.  Muscle Tone/Strength: Functionally intact. No obvious neuro-muscular anomalies detected.  Sensory (Neurological): Unimpaired        Sensory (Neurological): Unimpaired        DTR: Patellar: 0: absent Achilles: deferred today Plantar: deferred today  DTR: Patellar: 0: absent Achilles: deferred today Plantar: deferred today  Palpation: No palpable anomalies  Palpation: No palpable anomalies    Assessment  Primary Diagnosis & Pertinent Problem List: The primary encounter diagnosis was Occipital neuralgia of left side. Diagnoses of Chronic left-sided headaches, Closed nondisplaced fracture of surgical neck of left humerus, unspecified fracture morphology, sequela, and Chronic pain syndrome were also pertinent to this visit.  Visit Diagnosis (New problems to examiner): 1. Occipital neuralgia of left side   2. Chronic left-sided headaches   3. Closed nondisplaced  fracture of surgical neck of left humerus, unspecified fracture morphology, sequela   4. Chronic pain syndrome     Nakeitha's left-sided occipital headaches could be related to occipital neuralgia.  Discussed diagnostic left greater occipital nerve block under fluoroscopy.  Risks and benefits reviewed and patient would like to proceed.  Continue Mobic as prescribed.  Do not recommend any other analgesic medications that could increase her risk of sedation and fall.  Plan of Care (Initial workup plan)   Procedure Orders     GREATER OCCIPITAL NERVE BLOCK  Provider-requested follow-up: Return in about 20 days (around 04/05/2020) for Left GONB w/o sedation.  Future Appointments  Date Time Provider [place]  04/05/2020 11:00 AM Gillis Santa, MD [facility] None    Note by: Gillis Santa, MD Date: 03/16/2020; Time: 2:43 PM

## 2020-03-22 ENCOUNTER — Inpatient Hospital Stay
Admission: EM | Admit: 2020-03-22 | Discharge: 2020-03-27 | DRG: 309 | Disposition: A | Discharge status: Skilled Nursing Facility | Payer: Medicare Other | Attending: Internal Medicine | Admitting: Internal Medicine

## 2020-03-22 ENCOUNTER — Other Ambulatory Visit: Payer: Self-pay

## 2020-03-22 ENCOUNTER — Emergency Department: Payer: Medicare Other

## 2020-03-22 DIAGNOSIS — J8410 Pulmonary fibrosis, unspecified: Secondary | ICD-10-CM | POA: Diagnosis present

## 2020-03-22 DIAGNOSIS — Z888 Allergy status to other drugs, medicaments and biological substances status: Secondary | ICD-10-CM

## 2020-03-22 DIAGNOSIS — I499 Cardiac arrhythmia, unspecified: Secondary | ICD-10-CM | POA: Diagnosis not present

## 2020-03-22 DIAGNOSIS — R002 Palpitations: Secondary | ICD-10-CM | POA: Diagnosis present

## 2020-03-22 DIAGNOSIS — F05 Delirium due to known physiological condition: Secondary | ICD-10-CM | POA: Diagnosis not present

## 2020-03-22 DIAGNOSIS — G8929 Other chronic pain: Secondary | ICD-10-CM | POA: Diagnosis not present

## 2020-03-22 DIAGNOSIS — H548 Legal blindness, as defined in USA: Secondary | ICD-10-CM | POA: Diagnosis present

## 2020-03-22 DIAGNOSIS — I493 Ventricular premature depolarization: Secondary | ICD-10-CM | POA: Diagnosis present

## 2020-03-22 DIAGNOSIS — Z741 Need for assistance with personal care: Secondary | ICD-10-CM | POA: Diagnosis not present

## 2020-03-22 DIAGNOSIS — I5030 Unspecified diastolic (congestive) heart failure: Secondary | ICD-10-CM | POA: Diagnosis present

## 2020-03-22 DIAGNOSIS — I130 Hypertensive heart and chronic kidney disease with heart failure and stage 1 through stage 4 chronic kidney disease, or unspecified chronic kidney disease: Secondary | ICD-10-CM | POA: Diagnosis not present

## 2020-03-22 DIAGNOSIS — R54 Age-related physical debility: Secondary | ICD-10-CM | POA: Diagnosis present

## 2020-03-22 DIAGNOSIS — F3289 Other specified depressive episodes: Secondary | ICD-10-CM | POA: Diagnosis not present

## 2020-03-22 DIAGNOSIS — Z136 Encounter for screening for cardiovascular disorders: Secondary | ICD-10-CM | POA: Diagnosis not present

## 2020-03-22 DIAGNOSIS — J84112 Idiopathic pulmonary fibrosis: Secondary | ICD-10-CM | POA: Diagnosis present

## 2020-03-22 DIAGNOSIS — I4819 Other persistent atrial fibrillation: Principal | ICD-10-CM | POA: Diagnosis present

## 2020-03-22 DIAGNOSIS — F339 Major depressive disorder, recurrent, unspecified: Secondary | ICD-10-CM | POA: Diagnosis not present

## 2020-03-22 DIAGNOSIS — N189 Chronic kidney disease, unspecified: Secondary | ICD-10-CM | POA: Diagnosis present

## 2020-03-22 DIAGNOSIS — Z952 Presence of prosthetic heart valve: Secondary | ICD-10-CM | POA: Diagnosis not present

## 2020-03-22 DIAGNOSIS — H9190 Unspecified hearing loss, unspecified ear: Secondary | ICD-10-CM | POA: Diagnosis present

## 2020-03-22 DIAGNOSIS — Z91013 Allergy to seafood: Secondary | ICD-10-CM

## 2020-03-22 DIAGNOSIS — R410 Disorientation, unspecified: Secondary | ICD-10-CM | POA: Diagnosis present

## 2020-03-22 DIAGNOSIS — M1990 Unspecified osteoarthritis, unspecified site: Secondary | ICD-10-CM | POA: Diagnosis not present

## 2020-03-22 DIAGNOSIS — I5021 Acute systolic (congestive) heart failure: Secondary | ICD-10-CM | POA: Diagnosis not present

## 2020-03-22 DIAGNOSIS — R1312 Dysphagia, oropharyngeal phase: Secondary | ICD-10-CM | POA: Diagnosis not present

## 2020-03-22 DIAGNOSIS — I10 Essential (primary) hypertension: Secondary | ICD-10-CM | POA: Diagnosis present

## 2020-03-22 DIAGNOSIS — H543 Unqualified visual loss, both eyes: Secondary | ICD-10-CM

## 2020-03-22 DIAGNOSIS — I248 Other forms of acute ischemic heart disease: Secondary | ICD-10-CM | POA: Diagnosis present

## 2020-03-22 DIAGNOSIS — R2689 Other abnormalities of gait and mobility: Secondary | ICD-10-CM | POA: Diagnosis not present

## 2020-03-22 DIAGNOSIS — N184 Chronic kidney disease, stage 4 (severe): Secondary | ICD-10-CM | POA: Diagnosis not present

## 2020-03-22 DIAGNOSIS — E441 Mild protein-calorie malnutrition: Secondary | ICD-10-CM | POA: Diagnosis not present

## 2020-03-22 DIAGNOSIS — M6281 Muscle weakness (generalized): Secondary | ICD-10-CM | POA: Diagnosis not present

## 2020-03-22 DIAGNOSIS — H5316 Psychophysical visual disturbances: Secondary | ICD-10-CM | POA: Diagnosis not present

## 2020-03-22 DIAGNOSIS — M2550 Pain in unspecified joint: Secondary | ICD-10-CM | POA: Diagnosis not present

## 2020-03-22 DIAGNOSIS — Z8249 Family history of ischemic heart disease and other diseases of the circulatory system: Secondary | ICD-10-CM

## 2020-03-22 DIAGNOSIS — H547 Unspecified visual loss: Secondary | ICD-10-CM

## 2020-03-22 DIAGNOSIS — J4489 Other specified chronic obstructive pulmonary disease: Secondary | ICD-10-CM | POA: Diagnosis present

## 2020-03-22 DIAGNOSIS — Z7951 Long term (current) use of inhaled steroids: Secondary | ICD-10-CM

## 2020-03-22 DIAGNOSIS — G894 Chronic pain syndrome: Secondary | ICD-10-CM | POA: Diagnosis present

## 2020-03-22 DIAGNOSIS — Z7982 Long term (current) use of aspirin: Secondary | ICD-10-CM

## 2020-03-22 DIAGNOSIS — J449 Chronic obstructive pulmonary disease, unspecified: Secondary | ICD-10-CM | POA: Diagnosis present

## 2020-03-22 DIAGNOSIS — R778 Other specified abnormalities of plasma proteins: Secondary | ICD-10-CM | POA: Diagnosis not present

## 2020-03-22 DIAGNOSIS — G4700 Insomnia, unspecified: Secondary | ICD-10-CM | POA: Diagnosis not present

## 2020-03-22 DIAGNOSIS — Z79899 Other long term (current) drug therapy: Secondary | ICD-10-CM

## 2020-03-22 DIAGNOSIS — M351 Other overlap syndromes: Secondary | ICD-10-CM | POA: Diagnosis present

## 2020-03-22 DIAGNOSIS — I4891 Unspecified atrial fibrillation: Secondary | ICD-10-CM | POA: Diagnosis present

## 2020-03-22 DIAGNOSIS — D333 Benign neoplasm of cranial nerves: Secondary | ICD-10-CM | POA: Diagnosis present

## 2020-03-22 DIAGNOSIS — F32A Depression, unspecified: Secondary | ICD-10-CM | POA: Diagnosis present

## 2020-03-22 DIAGNOSIS — I480 Paroxysmal atrial fibrillation: Secondary | ICD-10-CM | POA: Diagnosis not present

## 2020-03-22 DIAGNOSIS — I2510 Atherosclerotic heart disease of native coronary artery without angina pectoris: Secondary | ICD-10-CM | POA: Diagnosis not present

## 2020-03-22 DIAGNOSIS — Z881 Allergy status to other antibiotic agents status: Secondary | ICD-10-CM

## 2020-03-22 DIAGNOSIS — Z882 Allergy status to sulfonamides status: Secondary | ICD-10-CM

## 2020-03-22 DIAGNOSIS — F329 Major depressive disorder, single episode, unspecified: Secondary | ICD-10-CM | POA: Diagnosis not present

## 2020-03-22 DIAGNOSIS — R000 Tachycardia, unspecified: Secondary | ICD-10-CM | POA: Diagnosis not present

## 2020-03-22 DIAGNOSIS — I739 Peripheral vascular disease, unspecified: Secondary | ICD-10-CM | POA: Diagnosis present

## 2020-03-22 DIAGNOSIS — F419 Anxiety disorder, unspecified: Secondary | ICD-10-CM | POA: Diagnosis present

## 2020-03-22 DIAGNOSIS — Z791 Long term (current) use of non-steroidal anti-inflammatories (NSAID): Secondary | ICD-10-CM

## 2020-03-22 DIAGNOSIS — F29 Unspecified psychosis not due to a substance or known physiological condition: Secondary | ICD-10-CM | POA: Diagnosis not present

## 2020-03-22 DIAGNOSIS — E785 Hyperlipidemia, unspecified: Secondary | ICD-10-CM | POA: Diagnosis not present

## 2020-03-22 DIAGNOSIS — N1832 Chronic kidney disease, stage 3b: Secondary | ICD-10-CM | POA: Diagnosis present

## 2020-03-22 DIAGNOSIS — I350 Nonrheumatic aortic (valve) stenosis: Secondary | ICD-10-CM | POA: Diagnosis present

## 2020-03-22 DIAGNOSIS — R4189 Other symptoms and signs involving cognitive functions and awareness: Secondary | ICD-10-CM | POA: Diagnosis not present

## 2020-03-22 DIAGNOSIS — Z20822 Contact with and (suspected) exposure to covid-19: Secondary | ICD-10-CM | POA: Diagnosis present

## 2020-03-22 DIAGNOSIS — R278 Other lack of coordination: Secondary | ICD-10-CM | POA: Diagnosis not present

## 2020-03-22 DIAGNOSIS — N1831 Chronic kidney disease, stage 3a: Secondary | ICD-10-CM | POA: Diagnosis present

## 2020-03-22 DIAGNOSIS — R0602 Shortness of breath: Secondary | ICD-10-CM | POA: Diagnosis not present

## 2020-03-22 DIAGNOSIS — I509 Heart failure, unspecified: Secondary | ICD-10-CM | POA: Diagnosis not present

## 2020-03-22 DIAGNOSIS — R5381 Other malaise: Secondary | ICD-10-CM | POA: Diagnosis present

## 2020-03-22 DIAGNOSIS — R441 Visual hallucinations: Secondary | ICD-10-CM | POA: Diagnosis not present

## 2020-03-22 DIAGNOSIS — M5481 Occipital neuralgia: Secondary | ICD-10-CM | POA: Diagnosis present

## 2020-03-22 DIAGNOSIS — N179 Acute kidney failure, unspecified: Secondary | ICD-10-CM | POA: Diagnosis present

## 2020-03-22 DIAGNOSIS — Z7401 Bed confinement status: Secondary | ICD-10-CM | POA: Diagnosis not present

## 2020-03-22 LAB — COMPREHENSIVE METABOLIC PANEL
ALT: 9 U/L (ref 0–44)
AST: 17 U/L (ref 15–41)
Albumin: 3.6 g/dL (ref 3.5–5.0)
Alkaline Phosphatase: 67 U/L (ref 38–126)
Anion gap: 8 (ref 5–15)
BUN: 29 mg/dL — ABNORMAL HIGH (ref 8–23)
CO2: 28 mmol/L (ref 22–32)
Calcium: 9.3 mg/dL (ref 8.9–10.3)
Chloride: 100 mmol/L (ref 98–111)
Creatinine, Ser: 1.52 mg/dL — ABNORMAL HIGH (ref 0.44–1.00)
GFR calc Af Amer: 34 mL/min — ABNORMAL LOW (ref >60)
GFR calc non Af Amer: 29 mL/min — ABNORMAL LOW (ref >60)
Glucose, Bld: 105 mg/dL — ABNORMAL HIGH (ref 70–99)
Potassium: 4 mmol/L (ref 3.5–5.1)
Sodium: 136 mmol/L (ref 135–145)
Total Bilirubin: 0.6 mg/dL (ref 0.3–1.2)
Total Protein: 6.9 g/dL (ref 6.5–8.1)

## 2020-03-22 LAB — CBC WITH DIFFERENTIAL/PLATELET
Abs Immature Granulocytes: 0.04 10*3/uL (ref 0.00–0.07)
Basophils Absolute: 0.1 10*3/uL (ref 0.0–0.1)
Basophils Relative: 1 %
Eosinophils Absolute: 0.3 10*3/uL (ref 0.0–0.5)
Eosinophils Relative: 4 %
HCT: 31.7 % — ABNORMAL LOW (ref 36.0–46.0)
Hemoglobin: 10.3 g/dL — ABNORMAL LOW (ref 12.0–15.0)
Immature Granulocytes: 1 %
Lymphocytes Relative: 14 %
Lymphs Abs: 1.1 10*3/uL (ref 0.7–4.0)
MCH: 30.7 pg (ref 26.0–34.0)
MCHC: 32.5 g/dL (ref 30.0–36.0)
MCV: 94.6 fL (ref 80.0–100.0)
Monocytes Absolute: 0.7 10*3/uL (ref 0.1–1.0)
Monocytes Relative: 9 %
Neutro Abs: 5.7 10*3/uL (ref 1.7–7.7)
Neutrophils Relative %: 71 %
Platelets: 206 10*3/uL (ref 150–400)
RBC: 3.35 MIL/uL — ABNORMAL LOW (ref 3.87–5.11)
RDW: 13.3 % (ref 11.5–15.5)
WBC: 7.8 10*3/uL (ref 4.0–10.5)
nRBC: 0 % (ref 0.0–0.2)

## 2020-03-22 LAB — BRAIN NATRIURETIC PEPTIDE: B Natriuretic Peptide: 496.8 pg/mL — ABNORMAL HIGH (ref 0.0–100.0)

## 2020-03-22 LAB — SARS CORONAVIRUS 2 BY RT PCR (HOSPITAL ORDER, PERFORMED IN ~~LOC~~ HOSPITAL LAB): SARS Coronavirus 2: NEGATIVE

## 2020-03-22 LAB — TROPONIN I (HIGH SENSITIVITY)
Troponin I (High Sensitivity): 83 ng/L — ABNORMAL HIGH (ref <18)
Troponin I (High Sensitivity): 149 ng/L (ref <18)

## 2020-03-22 LAB — MAGNESIUM: Magnesium: 1.9 mg/dL (ref 1.7–2.4)

## 2020-03-22 IMAGING — DX DG CHEST 1V PORT
1 series · 1 of 1 positions shown · non-contrast
Comparison: [DATE].

CLINICAL DATA: Shortness of breath.

EXAM:
PORTABLE CHEST 1 VIEW

[chest ap]
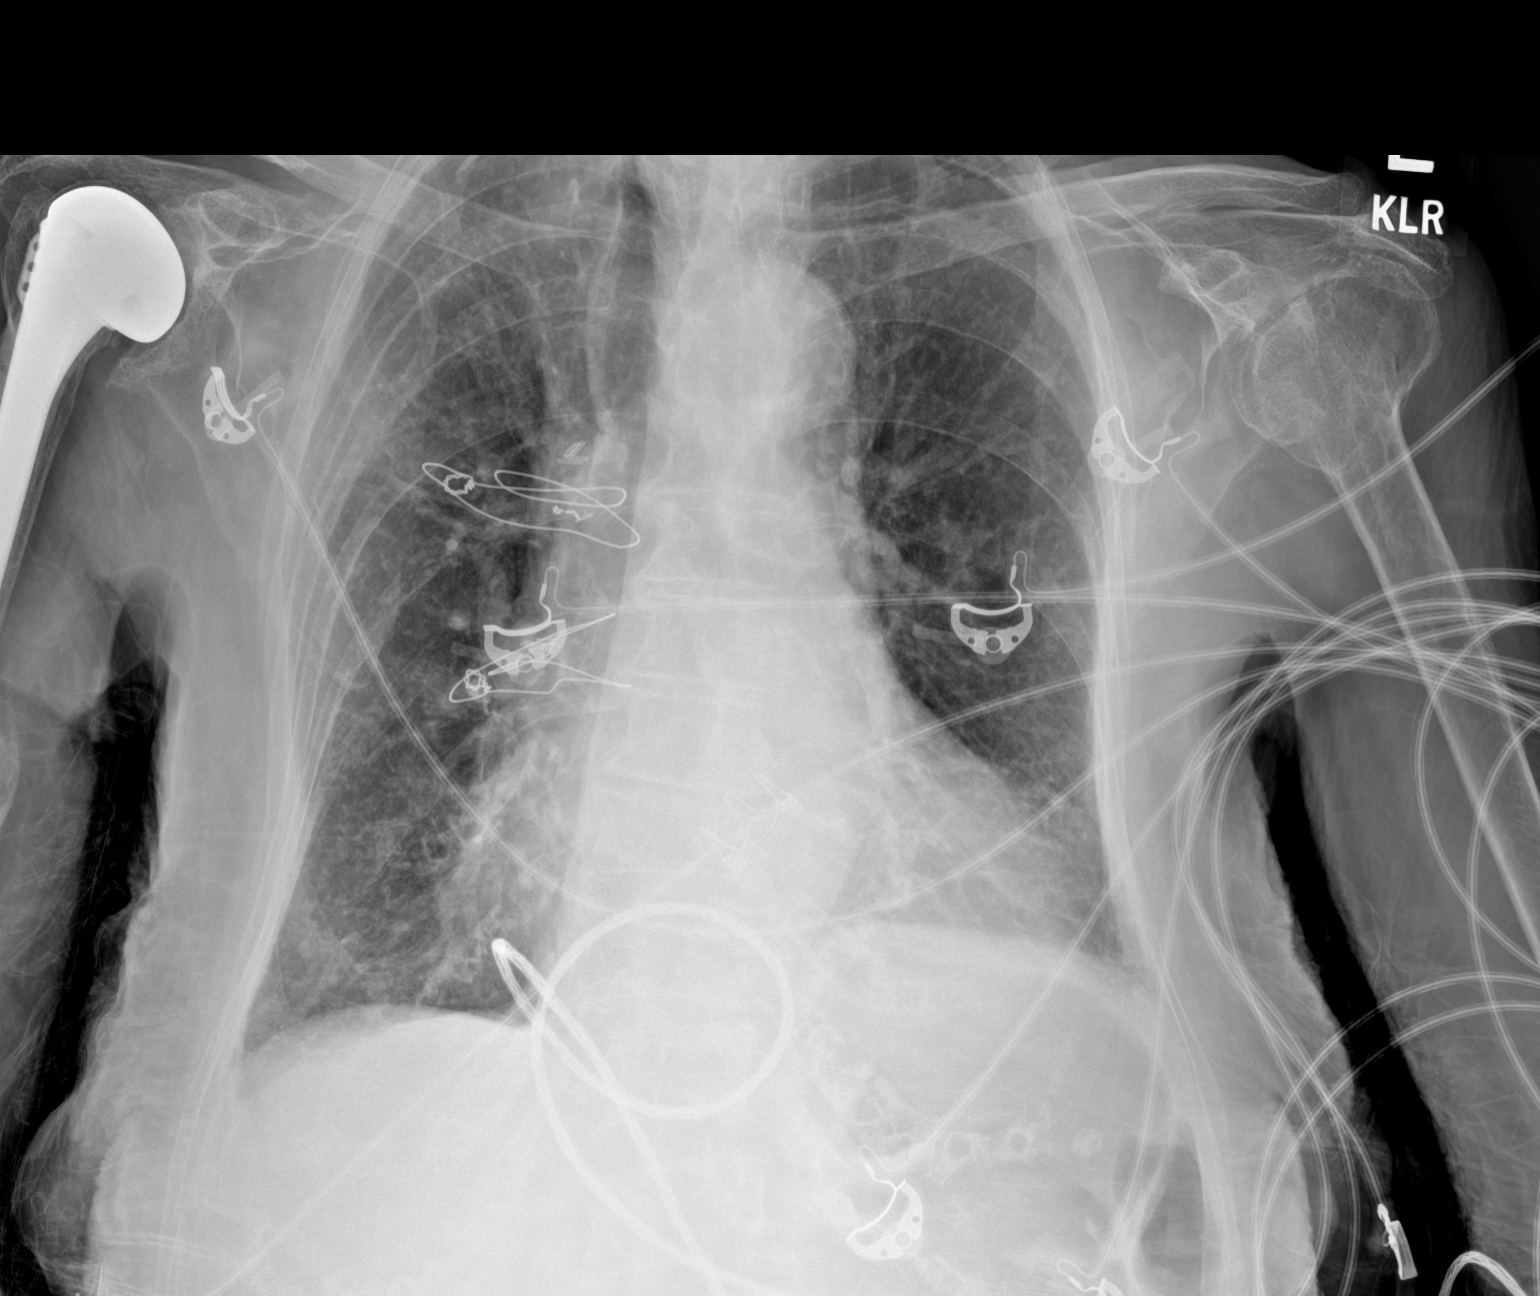

[1 of 1 positions shown; findings below may reference images not displayed]

FINDINGS: Stable cardiomegaly. No pneumothorax or pleural effusion is noted.
Status post aortic valve repair. Minimal bibasilar subsegmental
atelectasis is noted. Bony thorax is unremarkable.
IMPRESSION: Minimal bibasilar subsegmental atelectasis.

## 2020-03-22 MED ORDER — SODIUM CHLORIDE 0.9% FLUSH
3.0000 mL | Freq: Two times a day (BID) | INTRAVENOUS | Status: DC
  Administered 2020-03-23 – 2020-03-26 (×7): 3 mL via INTRAVENOUS

## 2020-03-22 MED ORDER — LEVALBUTEROL HCL 0.63 MG/3ML IN NEBU: 0.6300 mg | INHALATION_SOLUTION | Freq: Once | RESPIRATORY_TRACT | Status: DC

## 2020-03-22 MED ORDER — ONDANSETRON HCL 4 MG PO TABS: 4.0000 mg | ORAL_TABLET | Freq: Four times a day (QID) | ORAL | Status: DC | PRN

## 2020-03-22 MED ORDER — ALBUTEROL SULFATE (2.5 MG/3ML) 0.083% IN NEBU
2.5000 mg | INHALATION_SOLUTION | Freq: Four times a day (QID) | RESPIRATORY_TRACT | Status: DC | PRN
  Administered 2020-03-22: 2.5 mg via RESPIRATORY_TRACT
  Filled 2020-03-22: qty 3

## 2020-03-22 MED ORDER — TRAZODONE HCL 50 MG PO TABS
25.0000 mg | ORAL_TABLET | Freq: Every day | ORAL | Status: DC
  Administered 2020-03-23 (×2): 25 mg via ORAL
  Filled 2020-03-22 (×2): qty 1

## 2020-03-22 MED ORDER — SIMVASTATIN 20 MG PO TABS
20.0000 mg | ORAL_TABLET | Freq: Every day | ORAL | Status: DC
  Administered 2020-03-24 – 2020-03-26 (×3): 20 mg via ORAL
  Filled 2020-03-22 (×3): qty 1

## 2020-03-22 MED ORDER — METOPROLOL SUCCINATE ER 50 MG PO TB24
50.0000 mg | ORAL_TABLET | Freq: Every day | ORAL | Status: DC
  Administered 2020-03-22 – 2020-03-23 (×2): 50 mg via ORAL
  Filled 2020-03-22 (×2): qty 1

## 2020-03-22 MED ORDER — FUROSEMIDE 10 MG/ML IJ SOLN
40.0000 mg | Freq: Once | INTRAMUSCULAR | Status: AC
  Administered 2020-03-22: 40 mg via INTRAVENOUS
  Filled 2020-03-22: qty 4

## 2020-03-22 MED ORDER — ACETAMINOPHEN 500 MG PO TABS
500.0000 mg | ORAL_TABLET | Freq: Two times a day (BID) | ORAL | Status: DC | PRN
  Administered 2020-03-23 – 2020-03-27 (×2): 500 mg via ORAL
  Filled 2020-03-22 (×2): qty 1

## 2020-03-22 MED ORDER — DILTIAZEM HCL 25 MG/5ML IV SOLN
10.0000 mg | Freq: Once | INTRAVENOUS | Status: AC
  Administered 2020-03-22: 10 mg via INTRAVENOUS
  Filled 2020-03-22: qty 5

## 2020-03-22 MED ORDER — IPRATROPIUM-ALBUTEROL 0.5-2.5 (3) MG/3ML IN SOLN
3.0000 mL | Freq: Three times a day (TID) | RESPIRATORY_TRACT | Status: DC
  Administered 2020-03-23 – 2020-03-27 (×10): 3 mL via RESPIRATORY_TRACT
  Filled 2020-03-22 (×11): qty 3

## 2020-03-22 MED ORDER — DILTIAZEM LOAD VIA INFUSION
10.0000 mg | Freq: Once | INTRAVENOUS | Status: AC
  Administered 2020-03-22: 10 mg via INTRAVENOUS
  Filled 2020-03-22: qty 10

## 2020-03-22 MED ORDER — ESCITALOPRAM OXALATE 10 MG PO TABS
20.0000 mg | ORAL_TABLET | Freq: Every day | ORAL | Status: DC
  Administered 2020-03-23 – 2020-03-27 (×5): 20 mg via ORAL
  Filled 2020-03-22 (×5): qty 2

## 2020-03-22 MED ORDER — DILTIAZEM HCL-DEXTROSE 125-5 MG/125ML-% IV SOLN (PREMIX): 5.0000 mg/h | INTRAVENOUS | Status: DC

## 2020-03-22 MED ORDER — ASPIRIN EC 81 MG PO TBEC
81.0000 mg | DELAYED_RELEASE_TABLET | Freq: Every day | ORAL | Status: DC
  Administered 2020-03-23 – 2020-03-27 (×5): 81 mg via ORAL
  Filled 2020-03-22 (×5): qty 1

## 2020-03-22 MED ORDER — MOMETASONE FURO-FORMOTEROL FUM 200-5 MCG/ACT IN AERO
2.0000 | INHALATION_SPRAY | Freq: Two times a day (BID) | RESPIRATORY_TRACT | Status: DC
  Administered 2020-03-23 – 2020-03-27 (×8): 2 via RESPIRATORY_TRACT
  Filled 2020-03-22: qty 8.8

## 2020-03-22 MED ORDER — ONDANSETRON HCL 4 MG/2ML IJ SOLN: 4.0000 mg | Freq: Four times a day (QID) | INTRAMUSCULAR | Status: DC | PRN

## 2020-03-22 MED ORDER — SENNOSIDES-DOCUSATE SODIUM 8.6-50 MG PO TABS: 1.0000 | ORAL_TABLET | Freq: Every evening | ORAL | Status: DC | PRN

## 2020-03-22 MED ORDER — SORBITOL 70 % SOLN
30.0000 mL | Freq: Every day | Status: DC | PRN
  Filled 2020-03-22: qty 30

## 2020-03-22 MED ORDER — DILTIAZEM HCL-DEXTROSE 125-5 MG/125ML-% IV SOLN (PREMIX)
5.0000 mg/h | INTRAVENOUS | Status: DC
  Administered 2020-03-22: 5 mg/h via INTRAVENOUS
  Filled 2020-03-22: qty 125

## 2020-03-22 NOTE — ED Notes (Signed)
Pt reports feeling short of breath, states she usually does an albuterol breathing treatment and was unable to do that today due to feeling unwell. Pt placed on 2L [place] for comfort with O2 sats at 98% on RA and 100% on 2L [place]. MD Isaacs aware.

## 2020-03-22 NOTE — ED Provider Notes (Addendum)
[facility] Emergency Department Provider Note  ____________________________________________   First MD Initiated Contact with Patient 03/22/20 1437     (approximate)  I have reviewed the triage vital signs and the nursing notes.   HISTORY  Chief Complaint palpitations    HPI Marilyn Oconnell is a 86 y.o. female with past medical history of hypertension, hyperlipidemia, COPD, CHF, here with palpitations.  The patient reportedly has been having increasingly frequent headaches for the last several weeks.  She has seen neurology and was diagnosed with occipital neuralgia.  She has been taking Excedrin at night for this.  Today, she began to develop acute onset of palpitations, mild lightheadedness, and fatigue.  She lives alone and called for help.  She states that she feels like her heart is beating very quickly and irregularly.  She denies any associated chest pain that she has some mild left arm pressure.  Per report from family, patient has also had decreased appetite and has reportedly expressed that she has not been peeing as much recently.  No known fevers or chills.  No pain currently.  She does continue to feel palpitations.  No recent medication changes.        Past Medical History:  Diagnosis Date  . Arthritis   . CHF (congestive heart failure) ([place])   . COPD (chronic obstructive pulmonary disease) ([place])   . Coronary artery disease   . Hypercholesteremia   . Hypertension     Patient Active Problem List   Diagnosis Date Noted  . Atrial fibrillation with RVR ([place]) 03/22/2020  . Chronic pain syndrome 03/16/2020  . Closed nondisplaced fracture of surgical neck of left humerus 03/16/2020  . Chronic left-sided headaches 03/16/2020  . Occipital neuralgia of left side 03/16/2020  . Syncope 07/20/2019  . Recurrent syncope 07/19/2019  . Insomnia 03/18/2016  . Visual hallucinations 03/18/2016  . Vertigo 03/15/2016  . Paroxysmal atrial fibrillation  with RVR ([place]) 11/27/2015  . Depression 11/27/2015  . Adaptation reaction 09/28/2015  . Aortic heart valve narrowing 09/28/2015  . Bilateral cataracts 09/28/2015  . Cardiac murmur 09/28/2015  . Chronic kidney disease (CKD), stage III (moderate) 09/28/2015  . History of gout 09/28/2015  . 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/2015  . OP (osteoporosis) 09/28/2015  . Atherosclerosis of coronary artery 09/08/2015  . Avitaminosis D 07/11/2015  . LBP (low back pain) 07/11/2015  . BP (high blood pressure) 07/11/2015  . Hyperlipemia 04/12/2015  . Billowing mitral valve 08/03/2014  . Fibrosis of lung ([place]) 05/25/2014  . 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 H/O aortic valve replacement 11/09/2004    Past Surgical History:  Procedure Laterality Date  . AORTIC VALVE REPLACEMENT  2006  . DILATION AND CURETTAGE OF UTERUS  1988  . SHOULDER SURGERY  09/1999  . SIGMOIDOSCOPY  1992    Prior to Admission medications   Medication Sig Start Date End Date Taking? Authorizing Provider  acetaminophen (TYLENOL) 500 MG tablet Take 500-1,000 mg by mouth 2 (two) times daily as needed for mild pain, moderate pain, fever or headache.    Yes [provider]  albuterol (PROVENTIL) (2.5 MG/3ML) 0.083% nebulizer solution Take 2.5 mg by nebulization every 8 (eight) hours as needed for wheezing or shortness of breath.    Yes [provider]  allopurinol (ZYLOPRIM) 100 MG tablet Take 100 mg by mouth every evening.    Yes [provider]  aspirin EC 81 MG tablet Take 81 mg by mouth daily.  Yes [provider]  budesonide (PULMICORT) 0.5 MG/2ML nebulizer solution Take 0.5 mg by nebulization at bedtime.   Yes [provider]  escitalopram (LEXAPRO) 20 MG tablet TAKE 1 TABLET BY MOUTH  DAILY Patient taking differently: Take 20 mg by mouth daily.  05/13/19  Yes Carles Collet M, PA-C  furosemide (LASIX) 20 MG tablet TAKE 1 TABLET BY MOUTH   DAILY Patient taking differently: Take 20 mg by mouth daily.  05/13/19  Yes Carles Collet M, PA-C  meloxicam (MOBIC) 15 MG tablet Take 15 mg by mouth.    Yes [provider]  metoprolol succinate (TOPROL-XL) 50 MG 24 hr tablet Take 1 tablet (50 mg total) by mouth daily. Take with or immediately following a meal. 09/03/19  Yes Pollak, Adriana M, PA-C  simvastatin (ZOCOR) 20 MG tablet TAKE 1 TABLET BY MOUTH AT  BEDTIME Patient taking differently: Take 20 mg by mouth at bedtime.  05/13/19  Yes Trinna Post, PA-C  traZODone (DESYREL) 50 MG tablet TAKE ONE-HALF TABLET BY  MOUTH AT BEDTIME Patient taking differently: Take 25 mg by mouth at bedtime.  07/01/19  Yes Trinna Post, PA-C    Allergies Iodine, Shellfish allergy, Azithromycin, and Sulfa antibiotics  Family History  Problem Relation Age of Onset  . Aneurysm Father   . Lung cancer Brother   . Prostate cancer Brother   . Heart failure Brother     Social History Social History   Tobacco Use  . Smoking status: Never Smoker  . Smokeless tobacco: Never Used  Substance Use Topics  . Alcohol use: No  . Drug use: No    Review of Systems  Review of Systems  Constitutional: Positive for fatigue. Negative for fever.  HENT: Negative for congestion and sore throat.   Eyes: Negative for visual disturbance.  Respiratory: Positive for chest tightness. Negative for cough and shortness of breath.   Cardiovascular: Positive for palpitations. Negative for chest pain.  Gastrointestinal: Negative for abdominal pain, diarrhea, nausea and vomiting.  Genitourinary: Negative for flank pain.  Musculoskeletal: Negative for back pain and neck pain.  Skin: Negative for rash and wound.  Neurological: Positive for light-headedness. Negative for weakness.  All other systems reviewed and are negative.    ____________________________________________  PHYSICAL EXAM:      VITAL SIGNS: ED Triage Vitals  Enc Vitals Group     BP --        Pulse Rate 03/22/20 1432 (!) 117     Resp 03/22/20 1432 20     Temp 03/22/20 1436 98.1 F (36.7 C)     Temp Source 03/22/20 1436 Oral     SpO2 --      Weight 03/22/20 1434 127 lb (57.6 kg)     Height 03/22/20 1434 5' (1.524 m)     Head Circumference --      Peak Flow --      Pain Score 03/22/20 1433 0     Pain Loc --      Pain Edu? --      Excl. in [place]? --      Physical Exam Vitals and nursing note reviewed.  Constitutional:      General: She is not in acute distress.    Appearance: She is well-developed.  HENT:     Head: Normocephalic and atraumatic.  Eyes:     Conjunctiva/sclera: Conjunctivae normal.  Cardiovascular:     Rate and Rhythm: Tachycardia present. Rhythm irregularly irregular.     Heart sounds:  Normal heart sounds. No murmur heard.  No friction rub.  Pulmonary:     Effort: Pulmonary effort is normal. No respiratory distress.     Breath sounds: Normal breath sounds. No wheezing or rales.  Abdominal:     General: There is no distension.     Palpations: Abdomen is soft.     Tenderness: There is no abdominal tenderness.  Musculoskeletal:     Cervical back: Neck supple.  Skin:    General: Skin is warm.     Capillary Refill: Capillary refill takes less than 2 seconds.  Neurological:     Mental Status: She is alert and oriented to person, place, and time.     Motor: No abnormal muscle tone.       ____________________________________________   LABS (all labs ordered are listed, but only abnormal results are displayed)  Labs Reviewed  CBC WITH DIFFERENTIAL/PLATELET - Abnormal; Notable for the following components:      Result Value   RBC 3.35 (*)    Hemoglobin 10.3 (*)    HCT 31.7 (*)    All other components within normal limits  COMPREHENSIVE METABOLIC PANEL - Abnormal; Notable for the following components:   Glucose, Bld 105 (*)    BUN 29 (*)    Creatinine, Ser 1.52 (*)    GFR calc non Af Amer 29 (*)    GFR calc Af Amer 34 (*)    All other  components within normal limits  BRAIN NATRIURETIC PEPTIDE - Abnormal; Notable for the following components:   B Natriuretic Peptide 496.8 (*)    All other components within normal limits  TROPONIN I (HIGH SENSITIVITY) - Abnormal; Notable for the following components:   Troponin I (High Sensitivity) 83 (*)    All other components within normal limits  TROPONIN I (HIGH SENSITIVITY) - Abnormal; Notable for the following components:   Troponin I (High Sensitivity) 149 (*)    All other components within normal limits  SARS CORONAVIRUS 2 BY RT PCR (HOSPITAL ORDER, [place] [facility])  MAGNESIUM  COMPREHENSIVE METABOLIC PANEL  CBC    ____________________________________________  EKG: Atrial fibrillation, ventricular rate 130.  QRS 104, QTc 478.  A. fib RVR, nonspecific ST changes.  No acute ST elevations or depressions. ________________________________________  RADIOLOGY All imaging, including plain films, CT scans, and ultrasounds, independently reviewed by me, and interpretations confirmed via formal radiology reads.  ED MD interpretation:   Chest x-ray: Reviewed by myself, consistent with interstitial edema  Official radiology report(s): DG Chest Portable 1 View  Result Date: 03/22/2020 CLINICAL DATA:  Shortness of breath. EXAM: PORTABLE CHEST 1 VIEW COMPARISON:  July 19, 2019. FINDINGS: Stable cardiomegaly. No pneumothorax or pleural effusion is noted. Status post aortic valve repair. Minimal bibasilar subsegmental atelectasis is noted. Bony thorax is unremarkable. IMPRESSION: Minimal bibasilar subsegmental atelectasis. Electronically Signed   By: Marijo Conception M.D.   On: 03/22/2020 16:35    ____________________________________________  PROCEDURES   Procedure(s) performed (including Critical Care):  .Critical Care Performed by: Duffy Bruce, MD Authorized by: Duffy Bruce, MD   Critical care provider statement:    Critical care time (minutes):   35   Critical care time was exclusive of:  Separately billable procedures and treating other patients and teaching time   Critical care was necessary to treat or prevent imminent or life-threatening deterioration of the following conditions:  Cardiac failure, circulatory failure and respiratory failure   Critical care was time spent personally by me on  the following activities:  Development of treatment plan with patient or surrogate, discussions with consultants, evaluation of patient's response to treatment, examination of patient, obtaining history from patient or surrogate, ordering and performing treatments and interventions, ordering and review of laboratory studies, ordering and review of radiographic studies, pulse oximetry, re-evaluation of patient's condition and review of old charts   I assumed direction of critical care for this patient from another provider in my specialty: no    .1-3 Lead EKG Interpretation Performed by: Duffy Bruce, MD Authorized by: Duffy Bruce, MD     Interpretation: abnormal     ECG rate:  100-140s   ECG rate assessment: tachycardic     Rhythm: atrial fibrillation     Ectopy: PVCs     Conduction: normal   Comments:     Indication: Afib RVR, SOB    ____________________________________________  INITIAL IMPRESSION / MDM / ASSESSMENT AND PLAN / ED COURSE  As part of my medical decision making, I reviewed the following data within the [place] notes reviewed and incorporated, Old chart reviewed, Notes from prior ED visits, and [place] Controlled Substance Database       *Marilyn Oconnell was evaluated in Emergency Department on 03/22/2020 for the symptoms described in the history of present illness. She was evaluated in the context of the global COVID-19 pandemic, which necessitated consideration that the patient might be at risk for infection with the SARS-CoV-2 virus that causes COVID-19. Institutional protocols and algorithms  that pertain to the evaluation of patients at risk for COVID-19 are in a state of rapid change based on information released by regulatory bodies including the CDC and federal and state organizations. These policies and algorithms were followed during the patient's care in the ED.  Some ED evaluations and interventions may be delayed as a result of limited staffing during the pandemic.*     Medical Decision Making: 86 year old female here with A. fib RVR, likely multifactorial secondary to recent Excedrin use, pain from her occipital neuralgia, as well as possibly mild CHF.  Patient given initial bolus of diltiazem and then started on a drip for persistent A. fib RVR.  She has some nonspecific ST changes and troponin of 90s, which I suspect is demand related to her rate.  She has no overt chest pain or evidence of ST elevations.  Will start on diltiazem drip, give Lasix, and admit.  No apparent infectious symptoms.  ____________________________________________  FINAL CLINICAL IMPRESSION(S) / ED DIAGNOSES  Final diagnoses:  Atrial fibrillation with rapid ventricular response ([place])     MEDICATIONS GIVEN DURING THIS VISIT:  Medications  diltiazem (CARDIZEM) 1 mg/mL load via infusion 10 mg (10 mg Intravenous Bolus from Bag 03/22/20 1631)    And  diltiazem (CARDIZEM) 125 mg in dextrose 5% 125 mL (1 mg/mL) infusion (5 mg/hr Intravenous New Bag/Given 03/22/20 1631)  sodium chloride flush (NS) 0.9 % injection 3 mL (has no administration in time range)  senna-docusate (Senokot-S) tablet 1 tablet (has no administration in time range)  sorbitol 70 % solution 30 mL (has no administration in time range)  ondansetron (ZOFRAN) tablet 4 mg (has no administration in time range)    Or  ondansetron (ZOFRAN) injection 4 mg (has no administration in time range)  diltiazem (CARDIZEM) 125 mg in dextrose 5% 125 mL (1 mg/mL) infusion ( Intravenous Canceled Entry 03/22/20 1904)  acetaminophen (TYLENOL) tablet 500 mg  (has no administration in time range)  aspirin EC tablet 81 mg (has  no administration in time range)  metoprolol succinate (TOPROL-XL) 24 hr tablet 50 mg (50 mg Oral Given 03/22/20 1911)  simvastatin (ZOCOR) tablet 20 mg (has no administration in time range)  escitalopram (LEXAPRO) tablet 20 mg (has no administration in time range)  traZODone (DESYREL) tablet 25 mg (has no administration in time range)  albuterol (PROVENTIL) (2.5 MG/3ML) 0.083% nebulizer solution 2.5 mg (2.5 mg Nebulization Given 03/22/20 1912)  mometasone-formoterol (DULERA) 200-5 MCG/ACT inhaler 2 puff (has no administration in time range)  ipratropium-albuterol (DUONEB) 0.5-2.5 (3) MG/3ML nebulizer solution 3 mL (3 mLs Nebulization Not Given 03/22/20 2137)  diltiazem (CARDIZEM) injection 10 mg (10 mg Intravenous Given 03/22/20 1520)  furosemide (LASIX) injection 40 mg (40 mg Intravenous Given 03/22/20 1627)     ED Discharge Orders    None       Note:  This document was prepared using Dragon voice recognition software and may include unintentional dictation errors.   Duffy Bruce, MD 03/22/20 Patrica Duel    Duffy Bruce, MD 03/22/20 2259

## 2020-03-22 NOTE — Progress Notes (Signed)
Patient received from ED via stretcher. Alert and oriented. On Dilt drip at 5 mg/hr; in NSR at 74. Pt is very HOH, and also has visual problems. PureWick in place. Skin unremarkable. Patient oriented to room and location of call bell.

## 2020-03-22 NOTE — H&P (Signed)
Marilyn Oconnell is an 86 y.o. female.   Chief Complaint: Palpitations HPI: The patient is a 86 yr old woman who carries a medical history significant for blindness, [name], arthritis, CHF, COPD, Coronary artery disease, Hyperlipidemia, hypertension. EMS was called when the patient told her niece that she had had palpitations for more than 2 hours. EMS found her to have heart rate of 160.   Past Medical History:  Diagnosis Date  . Arthritis   . CHF (congestive heart failure) ([place])   . COPD (chronic obstructive pulmonary disease) ([place])   . Coronary artery disease   . Hypercholesteremia   . Hypertension     Past Surgical History:  Procedure Laterality Date  . AORTIC VALVE REPLACEMENT  2006  . DILATION AND CURETTAGE OF UTERUS  1988  . SHOULDER SURGERY  09/1999  . SIGMOIDOSCOPY  1992    Family History  Problem Relation Age of Onset  . Aneurysm Father   . Lung cancer Brother   . Prostate cancer Brother   . Heart failure Brother    Social History:  reports that she has never smoked. She has never used smokeless tobacco. She reports that she does not drink alcohol and does not use drugs. (Not in a hospital admission)   Allergies:  Allergies  Allergen Reactions  . Iodine Anaphylaxis  . Shellfish Allergy Anaphylaxis  . Azithromycin Hives  . Sulfa Antibiotics Hives    A comprehensive review of systems was negative. Except for palpitations.   General appearance: The patient is elderly, weak, and frail. She is awake, alert, and oriented x 3. Head: Normocephalic, without obvious abnormality, atraumatic Eyes: conjunctivae/corneas clear. PERRL, EOM's intact. Fundi benign. Throat: lips, mucosa, and tongue normal; teeth and gums normal Neck: no adenopathy, no carotid bruit, no JVD, supple, symmetrical, trachea midline and thyroid not enlarged, symmetric, no tenderness/mass/nodules Resp: No increased work of breathing. No wheezes, rales, or rhonchi. No tactile  fremitus. Chest wall: no tenderness Cardio: Irregular, irregular heart rate. No murmurs, ectopy, or gallups, N GI: soft, non-tender; bowel sounds normal; no masses,  no organomegaly Extremities: extremities normal, atraumatic, no cyanosis or edema Pulses: 2+ and symmetric Skin: Skin color, texture, turgor normal. No rashes or lesions Lymph nodes: Cervical, supraclavicular, and axillary nodes normal. Neurologic: Grossly normal  Results for orders placed or performed during the hospital encounter of 03/22/20 (from the past 48 hour(s))  CBC with Differential     Status: Abnormal   Collection Time: 03/22/20  2:36 PM  Result Value Ref Range   WBC 7.8 4.0 - 10.5 K/uL   RBC 3.35 (L) 3.87 - 5.11 MIL/uL   Hemoglobin 10.3 (L) 12.0 - 15.0 g/dL   HCT 31.7 (L) 36 - 46 %   MCV 94.6 80.0 - 100.0 fL   MCH 30.7 26.0 - 34.0 pg   MCHC 32.5 30.0 - 36.0 g/dL   RDW 13.3 11.5 - 15.5 %   Platelets 206 150 - 400 K/uL   nRBC 0.0 0.0 - 0.2 %   Neutrophils Relative % 71 %   Neutro Abs 5.7 1.7 - 7.7 K/uL   Lymphocytes Relative 14 %   Lymphs Abs 1.1 0.7 - 4.0 K/uL   Monocytes Relative 9 %   Monocytes Absolute 0.7 0 - 1 K/uL   Eosinophils Relative 4 %   Eosinophils Absolute 0.3 0 - 0 K/uL   Basophils Relative 1 %   Basophils Absolute 0.1 0 - 0 K/uL   Immature Granulocytes 1 %  Abs Immature Granulocytes 0.04 0.00 - 0.07 K/uL    Comment: Performed at [facility], [address] [place], [place] [address]  Comprehensive metabolic panel     Status: Abnormal   Collection Time: 03/22/20  2:36 PM  Result Value Ref Range   Sodium 136 135 - 145 mmol/L   Potassium 4.0 3.5 - 5.1 mmol/L   Chloride 100 98 - 111 mmol/L   CO2 28 22 - 32 mmol/L   Glucose, Bld 105 (H) 70 - 99 mg/dL    Comment: Glucose reference range applies only to samples taken after fasting for at least 8 hours.   BUN 29 (H) 8 - 23 mg/dL   Creatinine, Ser 1.52 (H) 0.44 - 1.00 mg/dL   Calcium 9.3 8.9 - 10.3 mg/dL   Total Protein 6.9  6.5 - 8.1 g/dL   Albumin 3.6 3.5 - 5.0 g/dL   AST 17 15 - 41 U/L   ALT 9 0 - 44 U/L   Alkaline Phosphatase 67 38 - 126 U/L   Total Bilirubin 0.6 0.3 - 1.2 mg/dL   GFR calc non Af Amer 29 (L) >60 mL/min   GFR calc Af Amer 34 (L) >60 mL/min   Anion gap 8 5 - 15    Comment: Performed at [facility], [address] [place], [place] [address]  Magnesium     Status: None   Collection Time: 03/22/20  2:36 PM  Result Value Ref Range   Magnesium 1.9 1.7 - 2.4 mg/dL    Comment: Performed at [facility], [place], [place] [address]  Troponin I (High Sensitivity)     Status: Abnormal   Collection Time: 03/22/20  2:36 PM  Result Value Ref Range   Troponin I (High Sensitivity) 83 (H) <18 ng/L    Comment: (NOTE) Elevated high sensitivity troponin I (hsTnI) values and significant  changes across serial measurements may suggest ACS but many other  chronic and acute conditions are known to elevate hsTnI results.  Refer to the "Links" section for chest pain algorithms and additional  guidance. Performed at [facility], [address] [place], [place] [address]   Brain natriuretic peptide     Status: Abnormal   Collection Time: 03/22/20  2:36 PM  Result Value Ref Range   B Natriuretic Peptide 496.8 (H) 0.0 - 100.0 pg/mL    Comment: Performed at [facility], [address] [place], [place] [address]  SARS Coronavirus 2 by RT PCR (hospital order, performed in [facility] hospital lab) Nasopharyngeal Nasopharyngeal Swab     Status: None   Collection Time: 03/22/20  4:43 PM   Specimen: Nasopharyngeal Swab  Result Value Ref Range   SARS Coronavirus 2 NEGATIVE NEGATIVE    Comment: (NOTE) SARS-CoV-2 target nucleic acids are NOT DETECTED.  The SARS-CoV-2 RNA is generally detectable in upper and lower respiratory specimens during the acute phase of infection. The lowest concentration of SARS-CoV-2 viral copies this assay can detect is  250 copies / mL. A negative result does not preclude SARS-CoV-2 infection and should not be used as the sole basis for treatment or other patient management decisions.  A negative result may occur with improper specimen collection / handling, submission of specimen other than nasopharyngeal swab, presence of viral mutation(s) within the areas targeted by this assay, and inadequate number of viral copies (<250 copies / mL). A negative result must be combined with clinical observations, patient history, and epidemiological information.  Fact Sheet for Patients:   StrictlyIdeas.no  Fact Sheet for Healthcare Providers: BankingDealers.co.za  This test is not yet approved or  cleared by the [place] FDA and has been authorized for detection and/or diagnosis of SARS-CoV-2 by FDA under an Emergency Use Authorization (EUA).  This EUA will remain in effect (meaning this test can be used) for the duration of the COVID-19 declaration under Section 564(b)(1) of the Act, 21 U.S.C. section 360bbb-3(b)(1), unless the authorization is terminated or revoked sooner.  Performed at [facility], [address] [place], [place] [address]    @[email or web address] @  Blood pressure (!) 149/105, pulse 86, temperature 98.1 F (36.7 C), temperature source Oral, resp. rate (!) 21, height 5' (1.524 m), weight 57.6 kg, SpO2 100 %.    Assessment/Plan Atrial fibrillation with RVR: Pt has been placed on a cardizem drip. She is on metoprolol at home. She will be admitted to a telemetry bed on the drip and be transitioned over to oral medications for rate control. The patient is not on anticoagulation at home.  Hypertension: Blood pressure remains a little high despite diltiazem drip.   Chronic pain: Continue home meds  [name]: Noted.  Idiopathic Pulmonary Fibrosis: Noted.  I have seen and examined this patient myself. I have spent 72  minutes in her evaluation and care.  DVT Prophylaxis: Lovenox CODE STATUS: Full Code Family Communication: None availab.e Disposition: Patient is from home. Anticipate discharge to home. Barrier to discharge: Transition to oral rate control  Status is: Inpatient  Remains inpatient appropriate because:IV treatments appropriate due to intensity of illness or inability to take PO   Dispo: The patient is from: Home              Anticipated d/c is to: Home              Anticipated d/c date is: 1 day              Patient currently is not medically stable to d/c.   Christop Hippert 03/22/2020, 6:24 PM

## 2020-03-22 NOTE — ED Notes (Signed)
Pt given turkey sandwich tray 

## 2020-03-22 NOTE — ED Triage Notes (Addendum)
Pt here via [facility] from home. Per EMS, pt's niece called EMS d/t pt c/o of heart palpitations x2 hr. Pt found to be in afib RVR with a rate of 160. EMS administered 252ml NS.   BP 16-/90, cbg 121.  Pt hard of hearing/ has vision loss at baseline. 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 When lights are turned on, visions go away.

## 2020-03-23 ENCOUNTER — Inpatient Hospital Stay
Admit: 2020-03-23 | Discharge: 2020-03-23 | Disposition: A | Discharge status: Home / Self Care | Payer: Medicare Other | Attending: Internal Medicine | Admitting: Internal Medicine

## 2020-03-23 DIAGNOSIS — H547 Unspecified visual loss: Secondary | ICD-10-CM

## 2020-03-23 DIAGNOSIS — F3289 Other specified depressive episodes: Secondary | ICD-10-CM

## 2020-03-23 DIAGNOSIS — N1831 Chronic kidney disease, stage 3a: Secondary | ICD-10-CM

## 2020-03-23 DIAGNOSIS — J449 Chronic obstructive pulmonary disease, unspecified: Secondary | ICD-10-CM

## 2020-03-23 DIAGNOSIS — H5316 Psychophysical visual disturbances: Secondary | ICD-10-CM | POA: Diagnosis present

## 2020-03-23 DIAGNOSIS — J8410 Pulmonary fibrosis, unspecified: Secondary | ICD-10-CM

## 2020-03-23 DIAGNOSIS — R778 Other specified abnormalities of plasma proteins: Secondary | ICD-10-CM

## 2020-03-23 DIAGNOSIS — G894 Chronic pain syndrome: Secondary | ICD-10-CM

## 2020-03-23 DIAGNOSIS — R54 Age-related physical debility: Secondary | ICD-10-CM

## 2020-03-23 LAB — URINALYSIS, COMPLETE (UACMP) WITH MICROSCOPIC
Bilirubin Urine: NEGATIVE
Glucose, UA: NEGATIVE mg/dL
Hgb urine dipstick: NEGATIVE
Ketones, ur: NEGATIVE mg/dL
Nitrite: NEGATIVE
Protein, ur: 30 mg/dL — AB
Specific Gravity, Urine: 1.013 (ref 1.005–1.030)
pH: 5 (ref 5.0–8.0)

## 2020-03-23 LAB — COMPREHENSIVE METABOLIC PANEL
ALT: 9 U/L (ref 0–44)
AST: 15 U/L (ref 15–41)
Albumin: 3.4 g/dL — ABNORMAL LOW (ref 3.5–5.0)
Alkaline Phosphatase: 58 U/L (ref 38–126)
Anion gap: 11 (ref 5–15)
BUN: 32 mg/dL — ABNORMAL HIGH (ref 8–23)
CO2: 28 mmol/L (ref 22–32)
Calcium: 9.2 mg/dL (ref 8.9–10.3)
Chloride: 99 mmol/L (ref 98–111)
Creatinine, Ser: 1.81 mg/dL — ABNORMAL HIGH (ref 0.44–1.00)
GFR calc Af Amer: 27 mL/min — ABNORMAL LOW (ref >60)
GFR calc non Af Amer: 24 mL/min — ABNORMAL LOW (ref >60)
Glucose, Bld: 110 mg/dL — ABNORMAL HIGH (ref 70–99)
Potassium: 4 mmol/L (ref 3.5–5.1)
Sodium: 138 mmol/L (ref 135–145)
Total Bilirubin: 0.6 mg/dL (ref 0.3–1.2)
Total Protein: 6.6 g/dL (ref 6.5–8.1)

## 2020-03-23 LAB — CBC
HCT: 30.5 % — ABNORMAL LOW (ref 36.0–46.0)
Hemoglobin: 10.1 g/dL — ABNORMAL LOW (ref 12.0–15.0)
MCH: 31 pg (ref 26.0–34.0)
MCHC: 33.1 g/dL (ref 30.0–36.0)
MCV: 93.6 fL (ref 80.0–100.0)
Platelets: 203 10*3/uL (ref 150–400)
RBC: 3.26 MIL/uL — ABNORMAL LOW (ref 3.87–5.11)
RDW: 13.4 % (ref 11.5–15.5)
WBC: 7.1 10*3/uL (ref 4.0–10.5)
nRBC: 0 % (ref 0.0–0.2)

## 2020-03-23 LAB — URINALYSIS, MICROSCOPIC (REFLEX): Bacteria, UA: NONE SEEN

## 2020-03-23 MED ORDER — METOPROLOL SUCCINATE ER 50 MG PO TB24
75.0000 mg | ORAL_TABLET | Freq: Every day | ORAL | Status: DC
  Administered 2020-03-24 – 2020-03-27 (×4): 75 mg via ORAL
  Filled 2020-03-23 (×4): qty 1

## 2020-03-23 MED ORDER — ALLOPURINOL 100 MG PO TABS
100.0000 mg | ORAL_TABLET | Freq: Every evening | ORAL | Status: DC
  Administered 2020-03-23 – 2020-03-26 (×4): 100 mg via ORAL
  Filled 2020-03-23 (×6): qty 1

## 2020-03-23 MED ORDER — HEPARIN SODIUM (PORCINE) 5000 UNIT/ML IJ SOLN: 5000.0000 [IU] | Freq: Three times a day (TID) | INTRAMUSCULAR | Status: DC

## 2020-03-23 MED ORDER — ORAL CARE MOUTH RINSE
15.0000 mL | Freq: Two times a day (BID) | OROMUCOSAL | Status: DC
  Administered 2020-03-23 – 2020-03-26 (×6): 15 mL via OROMUCOSAL

## 2020-03-23 MED ORDER — HEPARIN SODIUM (PORCINE) 5000 UNIT/ML IJ SOLN
5000.0000 [IU] | Freq: Two times a day (BID) | INTRAMUSCULAR | Status: DC
  Administered 2020-03-23 – 2020-03-27 (×8): 5000 [IU] via SUBCUTANEOUS
  Filled 2020-03-23 (×8): qty 1

## 2020-03-23 MED ORDER — METOPROLOL SUCCINATE ER 25 MG PO TB24
25.0000 mg | ORAL_TABLET | Freq: Once | ORAL | Status: DC
  Filled 2020-03-23: qty 1

## 2020-03-23 MED ORDER — HALOPERIDOL LACTATE 5 MG/ML IJ SOLN
2.0000 mg | Freq: Once | INTRAMUSCULAR | Status: AC
  Administered 2020-03-23: 2 mg via INTRAVENOUS
  Filled 2020-03-23: qty 1

## 2020-03-23 MED ORDER — HALOPERIDOL LACTATE 5 MG/ML IJ SOLN
2.0000 mg | INTRAMUSCULAR | Status: AC
  Administered 2020-03-23: 2 mg via INTRAVENOUS
  Filled 2020-03-23: qty 1

## 2020-03-23 MED ORDER — METOPROLOL SUCCINATE ER 25 MG PO TB24: 25.0000 mg | ORAL_TABLET | Freq: Every day | ORAL | Status: DC

## 2020-03-23 NOTE — Progress Notes (Addendum)
Rachael Fee in to see patient and received order for an additional dose of Haldol. Patient slightly less aggressive, but remains agitated and confused, refuses some medications and attempts OOB. Refused MDI, but did take trazadone as ordered. Pt given Haldol 2 mg IV.   Update: 0200:Patient appears to be sleeping

## 2020-03-23 NOTE — Progress Notes (Signed)
Rachael Fee, APP on call, notified that pt is in NSR at 59. Received instructions to continue diltiazem at 5 mg/hr unless BP drops or HR drops below 65.

## 2020-03-23 NOTE — Progress Notes (Signed)
Patient was given Haldol 2 mg IV via left forearm saline lock.

## 2020-03-23 NOTE — Progress Notes (Signed)
*  PRELIMINARY RESULTS* Echocardiogram 2D Echocardiogram has been performed.  Wallie Char Aztlan Coll 03/23/2020, 10:38 AM

## 2020-03-23 NOTE — Progress Notes (Addendum)
Patient is extremely agitated, yelling , cursing, kicking, attempting to punch staff, and attempting OOB.Is also verbalizing verbal threats; " I am going to bust your heads open".  Safety sitter is at bedside. BP is elevated, 209/90. Spoke with Rachael Fee by phone to . Will check QTC, to assess for safety of administering Haldol. Rhythm is NSR in upper 90's. O2 sat is 99% on O2 at L per [place]. However, mostly refusing to wear O2.   Per CCMD QTC is 0.41. Received order for Haldol 2 mg IB stat. Will CTM

## 2020-03-23 NOTE — Progress Notes (Signed)
Cross Cover Brief Note Nurse call reports patient with worsening confusion and combative.  Multiple attempts to assist reorienting patient provided by nursing staff.   Nurse who had also taken care of patient last night notes hallucinations are exponentially worse. Review of progress noted that increased confusion and hallucinations started earlier in day Also noted patient with systolic BP above 979 which could be related to her hypertension.  Measured QTc by CCMD 410. small haldol dose ordered to help improve her symptoms that are likely acute delirium given her underlying vulnerable mental state and age.

## 2020-03-23 NOTE — Progress Notes (Addendum)
PROGRESS NOTE  Marilyn Oconnell XTG:[id number] DOB: 02-22-1924 DOA: 03/22/2020 PCP: Trinna Post, PA-C  Brief History    The patient is a 86 yr old woman who carries a medical history significant for blindness, [name], arthritis, CHF, COPD, Coronary artery disease, Hyperlipidemia, hypertension. EMS was called when the patient told her niece that she had had palpitations for more than 2 hours. EMS found her to have heart rate of 160.   [facility] were consulted to admit the patient for further evaluation and treatment. She was admitted to a telemetry bed on a diltiazem drip. Overnight she converted to sinus rhythm. She will be weaned off of diltiazem today. She has been continued on metoprolol as at home.   The patient has been confused today. She is having hallucinations which is not unusual for her, although they usually go away in the light, and the patient usually understands that they are not real. She does not seem to know that they are not real.  Consultants  None  Antibiotics   Anti-infectives (From admission, onward)    None      Subjective  The patient is sitting up in bed. No new complaints.  Objective   Vitals:  Vitals:   03/23/20 1058 03/23/20 1412  BP: 133/63   Pulse: (!) 55   Resp: 18   Temp: 97.9 F (36.6 C)   SpO2: 100% 99%   Exam:  Constitutional:  The patient is awake, alert, and oriented x 3. No acute distress. Respiratory:  No increased work of breathing. No wheezes, rales, or rhonchi No tactile fremitus Cardiovascular:  Regular rate and rhythm No murmurs, ectopy, or gallups. No lateral PMI. No thrills. Abdomen:  Abdomen is soft, non-tender, non-distended No hernias, masses, or organomegaly Normoactive bowel sounds.  Musculoskeletal:  No cyanosis, clubbing, or edema Skin:  No rashes, lesions, ulcers palpation of skin: no induration or nodules Neurologic:  CN 2-12 intact Sensation all 4 extremities  intact Psychiatric:  Pt is having visual hallucinations.  Unlike her usual, she is unable to determine whether these hallucinations are real or not.  I have personally reviewed the following:   Today's Data  Vitals, CMP, CBC, Troponins, UA  Cardiology Data  EKG Echocardiogram is pending.  Scheduled Meds:  allopurinol  100 mg Oral QPM   aspirin EC  81 mg Oral Daily   escitalopram  20 mg Oral Daily   heparin injection (subcutaneous)  5,000 Units Subcutaneous Q12H   ipratropium-albuterol  3 mL Nebulization TID   mouth rinse  15 mL Mouth Rinse BID   metoprolol succinate  25 mg Oral Once   [START ON 03/24/2020] metoprolol succinate  75 mg Oral Daily   mometasone-formoterol  2 puff Inhalation BID   simvastatin  20 mg Oral QHS   sodium chloride flush  3 mL Intravenous Q12H   traZODone  25 mg Oral QHS   Continuous Infusions:  diltiazem (CARDIZEM) infusion 5 mg/hr (03/22/20 1631)   diltiazem (CARDIZEM) infusion Stopped (03/23/20 1118)    Active Problems:   BP (high blood pressure)   Chronic kidney disease (CKD), stage III (moderate)   Fibrosis of lung (HCC)   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 She is on metoprolol at home. She will be admitted to a telemetry bed on the drip and be transitioned over to oral medications for rate control. The patient is not on anticoagulation at home. This has converted to sinus. Will monitor on metoprolol. I have discussed anticoagulation with the patient's niece. It seems that due to the patient's blindness and debility she does present a fall risk. At this point anticoagulation is not a good idea. I have encouraged the niece to discuss it with the patient's PCP in the future.  Elevated Troponin:  Trending down. Likely due to demand ischemia. Echocardiogram is pending.   Hypertension: Blood pressure improved on metoprolol.  Chronic pain: Continue home meds   [name]: Noted.   Idiopathic Pulmonary Fibrosis: Noted.  Debility: PT/OT to eval and treat.  CKD IVa   I have seen and examined this patient myself. I have spent 38 minutes in her evaluation and care.   DVT Prophylaxis: Lovenox CODE STATUS: Full Code Family Communication: None availab.e Disposition: Patient is from home. Anticipate discharge to home. Barrier to discharge: Transition to oral rate control, echocardiogram, and PT/OT eval.   Status is: Inpatient   Remains inpatient appropriate because:IV treatments appropriate due to intensity of illness or inability to take PO   Dispo: The patient is from: Home              Anticipated d/c is to: Home              Anticipated d/c date is: 1 day              Patient currently is not medically stable to d/c.   Kionna Brier, DO [facility] Direct contact: see [email or web address]  7PM-7AM contact night coverage as above 03/23/2020, 4:20 PM  LOS: 1 day

## 2020-03-24 ENCOUNTER — Inpatient Hospital Stay: Payer: Medicare Other

## 2020-03-24 DIAGNOSIS — I350 Nonrheumatic aortic (valve) stenosis: Secondary | ICD-10-CM

## 2020-03-24 DIAGNOSIS — R410 Disorientation, unspecified: Secondary | ICD-10-CM

## 2020-03-24 LAB — CBC
HCT: 31.7 % — ABNORMAL LOW (ref 36.0–46.0)
Hemoglobin: 11 g/dL — ABNORMAL LOW (ref 12.0–15.0)
MCH: 31.3 pg (ref 26.0–34.0)
MCHC: 34.7 g/dL (ref 30.0–36.0)
MCV: 90.3 fL (ref 80.0–100.0)
Platelets: 208 10*3/uL (ref 150–400)
RBC: 3.51 MIL/uL — ABNORMAL LOW (ref 3.87–5.11)
RDW: 13.1 % (ref 11.5–15.5)
WBC: 11 10*3/uL — ABNORMAL HIGH (ref 4.0–10.5)
nRBC: 0 % (ref 0.0–0.2)

## 2020-03-24 LAB — ECHOCARDIOGRAM COMPLETE
Height: 60 in
Weight: 1790.4 oz

## 2020-03-24 LAB — BASIC METABOLIC PANEL
Anion gap: 12 (ref 5–15)
BUN: 37 mg/dL — ABNORMAL HIGH (ref 8–23)
CO2: 27 mmol/L (ref 22–32)
Calcium: 9.4 mg/dL (ref 8.9–10.3)
Chloride: 97 mmol/L — ABNORMAL LOW (ref 98–111)
Creatinine, Ser: 1.68 mg/dL — ABNORMAL HIGH (ref 0.44–1.00)
GFR calc Af Amer: 30 mL/min — ABNORMAL LOW (ref >60)
GFR calc non Af Amer: 26 mL/min — ABNORMAL LOW (ref >60)
Glucose, Bld: 131 mg/dL — ABNORMAL HIGH (ref 70–99)
Potassium: 4 mmol/L (ref 3.5–5.1)
Sodium: 136 mmol/L (ref 135–145)

## 2020-03-24 LAB — TROPONIN I (HIGH SENSITIVITY): Troponin I (High Sensitivity): 156 ng/L (ref <18)

## 2020-03-24 IMAGING — CT CT HEAD W/O CM
3 series · 15 of 47 positions shown, 18 images · non-contrast
Comparison: Prior head CT examinations [DATE] and earlier.

CLINICAL DATA: Altered mental status, unclear cause.

EXAM:
CT HEAD WITHOUT CONTRAST
TECHNIQUE: Contiguous axial images were obtained from the base of the skull
through the vertex without intravenous contrast.

[Series 2: head wo · axial · 0.41mm/px · z∈[+150,+275]mm · 9 of 30 slices shown, 12 images]
[im 3/30  brain]
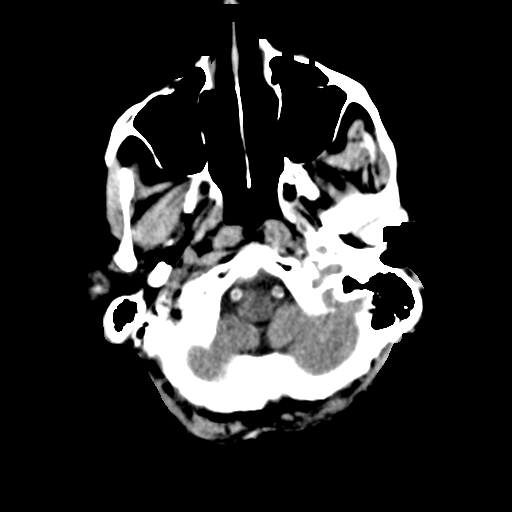
[im 3/30  bone]
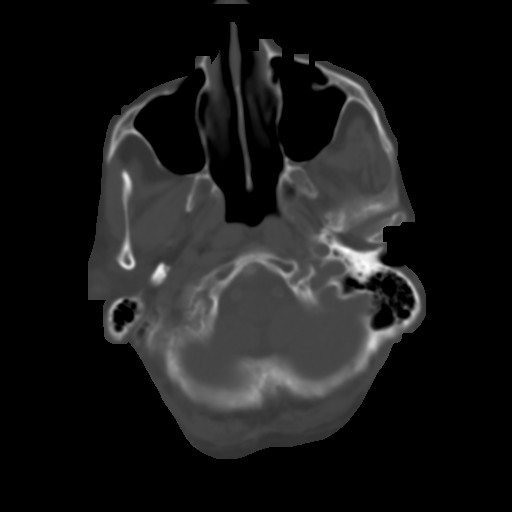
[im 6/30  brain]
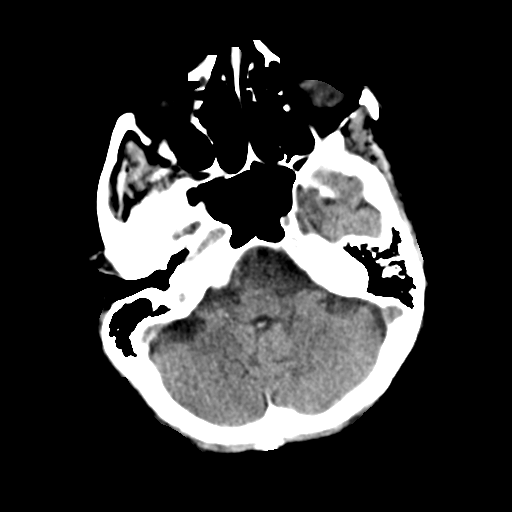
[im 9/30  brain]
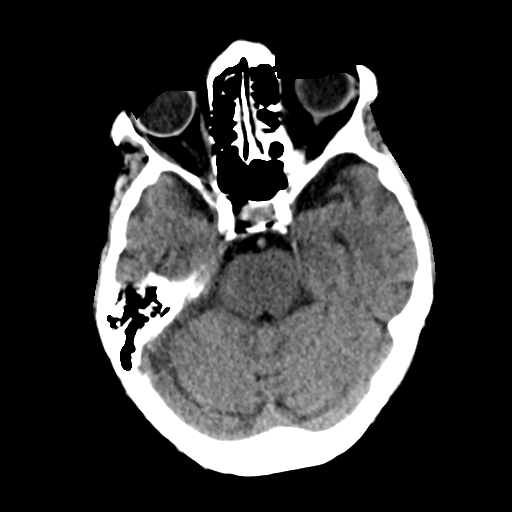
[im 12/30  brain]
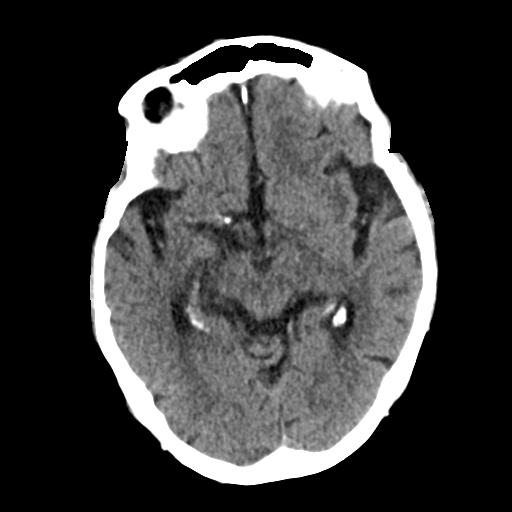
[im 16/30  brain]
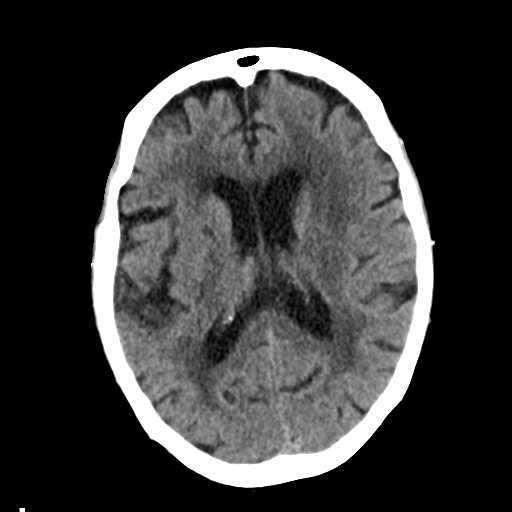
[im 16/30  bone]
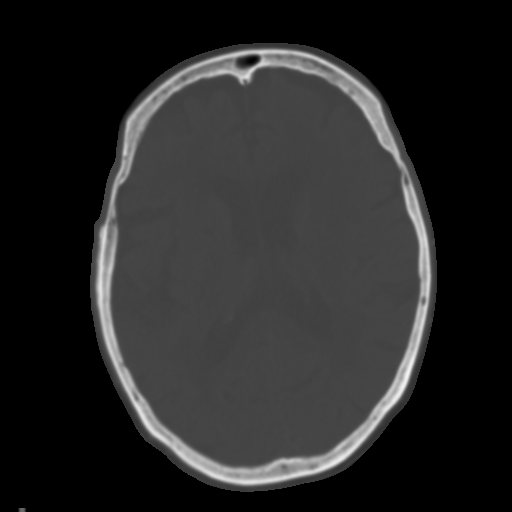
[im 19/30  brain]
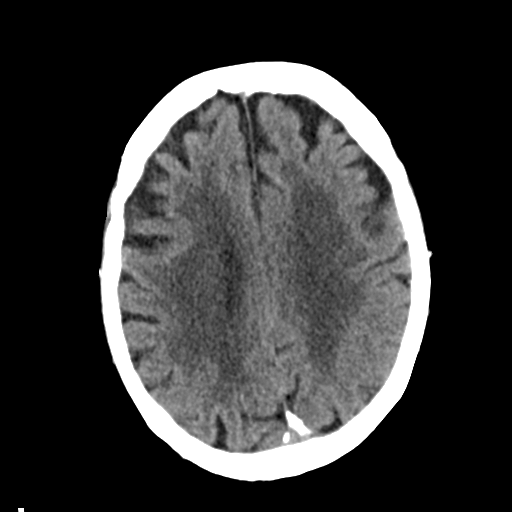
[im 22/30  brain]
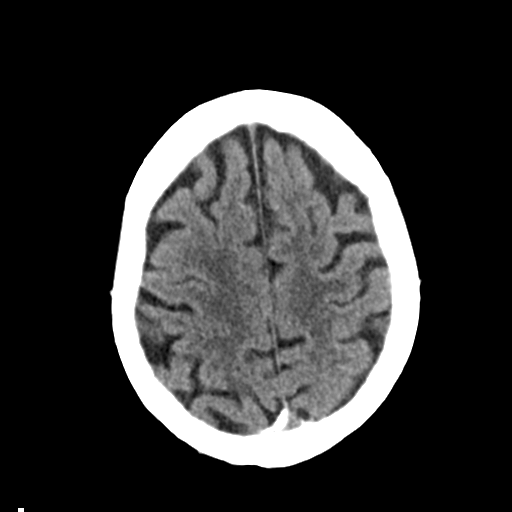
[im 25/30  brain]
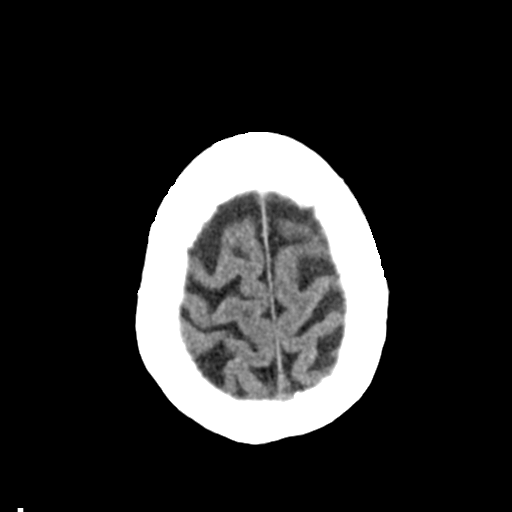
[im 28/30  brain]
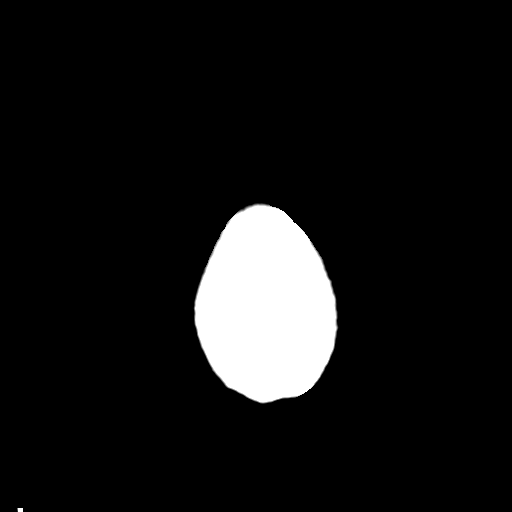
[im 28/30  bone]
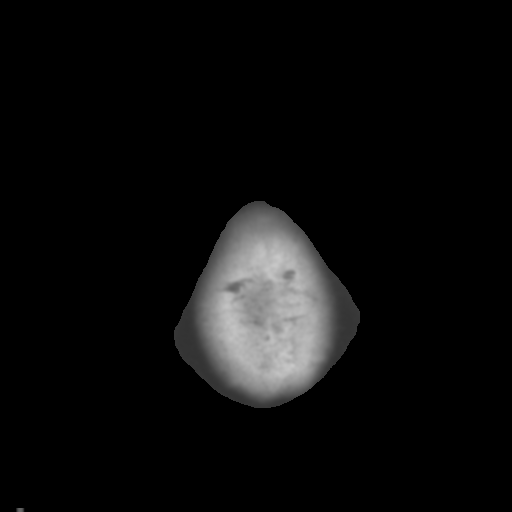

[Series 4: coronal soft tissue · coronal · 0.30mm/px · 3 of 62 slices shown]
[im 21/62  brain]
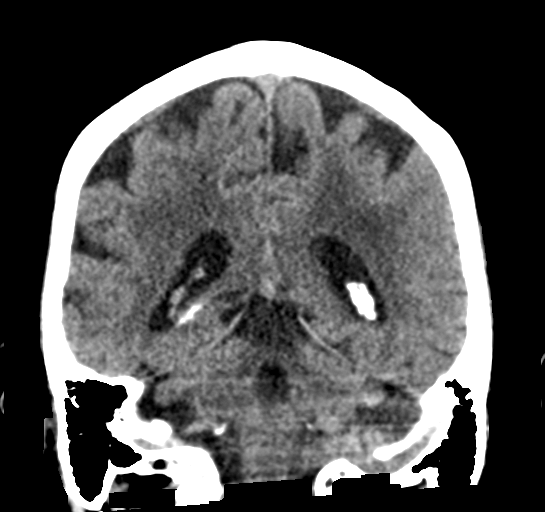
[im 28/62  brain]
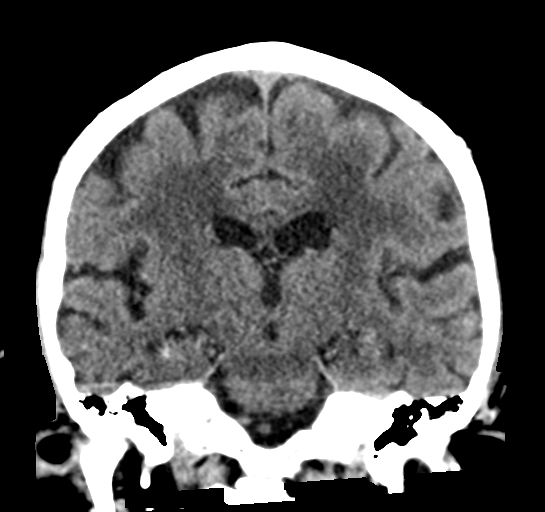
[im 34/62  brain]
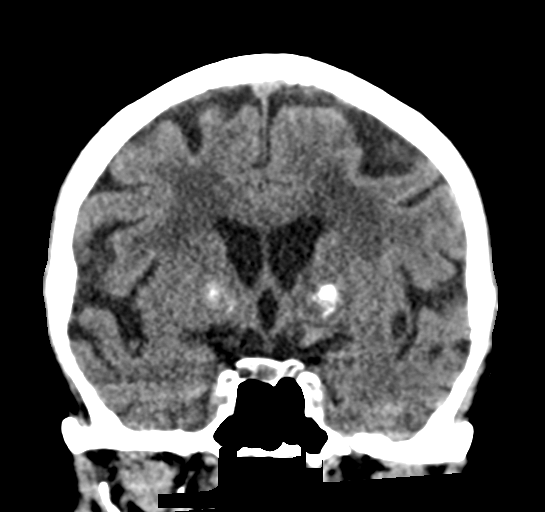

[Series 5: sagittal soft tissue · sagittal · 0.31mm/px · 3 of 50 slices shown]
[im 17/50  brain]
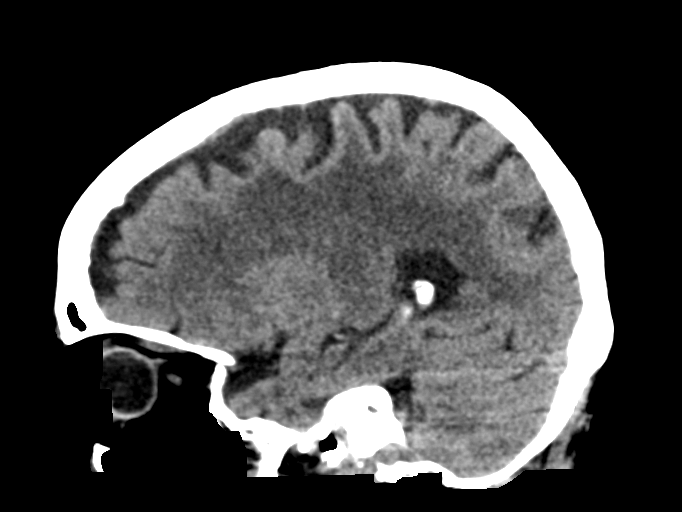
[im 25/50  brain]
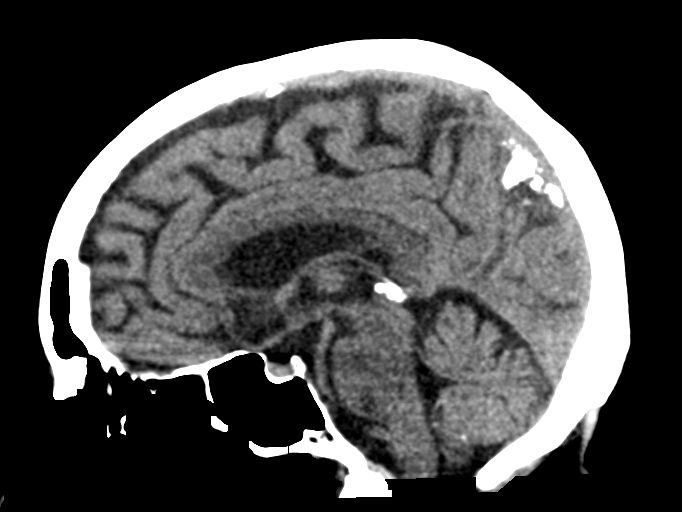
[im 33/50  brain]
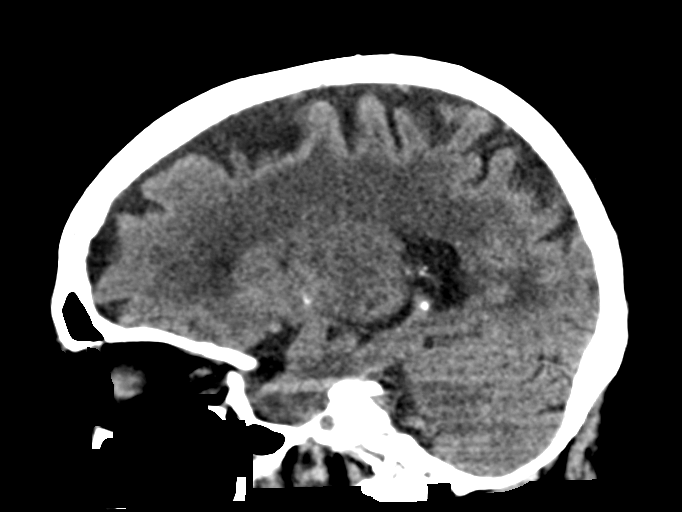

[15 of 47 positions shown; findings below may reference images not displayed]

FINDINGS: Brain:

Stable, moderate generalized parenchymal atrophy.

Stable, advanced ill-defined hypoattenuation within the cerebral
white matter which is nonspecific, but consistent with chronic small
vessel ischemic disease.

There is a small lacunar infarct within the right basal
ganglia/internal capsule which was not present on prior head CT
[DATE] but is otherwise age indeterminate.

Redemonstrated mineralization within the bilateral basal ganglia.

There is no acute intracranial hemorrhage.

No demarcated cortical infarct is identified.

No extra-axial fluid collection.

No evidence of intracranial mass.

No midline shift.

Vascular: No hyperdense vessel.  Atherosclerotic calcifications.

Skull: Normal. Negative for fracture or focal lesion.

Sinuses/Orbits: Visualized orbits show no acute finding. Minimal
ethmoid sinus mucosal thickening at the imaged levels. No
significant mastoid effusion at the imaged levels.
IMPRESSION: A small lacunar infarct within the right basal ganglia/internal
capsule was not present on prior head CT [DATE], but is
otherwise age indeterminate.

Stable background moderate generalized parenchymal atrophy and
advanced chronic small vessel ischemic disease.

Minimal ethmoid sinus mucosal thickening.

## 2020-03-24 MED ORDER — QUETIAPINE FUMARATE 25 MG PO TABS
50.0000 mg | ORAL_TABLET | Freq: Every day | ORAL | Status: DC
  Administered 2020-03-24: 50 mg via ORAL
  Filled 2020-03-24: qty 2

## 2020-03-24 MED ORDER — QUETIAPINE FUMARATE 25 MG PO TABS: 25.0000 mg | ORAL_TABLET | Freq: Every morning | ORAL | Status: DC

## 2020-03-24 NOTE — Progress Notes (Signed)
OT Cancellation Note  Patient Details Name: Marilyn Oconnell MRN: [id number] DOB: 09-05-1924   Cancelled Treatment:    Reason Eval/Treat Not Completed: Fatigue/lethargy limiting ability to participate. OT order received and chart reviewed. Upon arrival, pt asleep c daughter and sitter reporting pt up all night combative and restless - requesting OT hold this AM to allow sleep. Will follow acutely and initiate services as able.   Dessie Coma, M.S. OTR/L  03/24/20, 9:17 AM

## 2020-03-24 NOTE — Evaluation (Signed)
Physical Therapy Evaluation Patient Details Name: Marilyn Oconnell MRN: [id number] DOB: 1924/09/02 Today's Date: 03/24/2020   History of Present Illness   The patient is a 86 yr old woman who carries a medical history significant for blindness, [name], arthritis, CHF, COPD, Coronary artery disease, Hyperlipidemia, hypertension. Pt presents with Atrial fibrillation with RVR.   Clinical Impression  Pt showed good effort with PT exam despite relatively flat affect t/o the session.  She was able to answer some questions, PLOF also gathered from previous documentation.  Pt did well with bed mobility and transfers and ultimately was able to ambulate ~100 ft in the room with walker (on room air, sats stayed >95%) and only supervision/cuing for obstacle avoidance (though she did surprisingly well with this given her visual limitations.)  Pt with slow but safe ambulation, reports feeling significantly different from baseline and PT is recommending HHPT at discharge.      Follow Up Recommendations Home health PT;Supervision/Assistance - 24 hour    Equipment Recommendations  None recommended by PT    Recommendations for Other Services       Precautions / Restrictions Precautions Precautions: Fall Restrictions Weight Bearing Restrictions: No      Mobility  Bed Mobility Overal bed mobility: Needs Assistance Bed Mobility: Supine to Sit Rolling: Supervision (multimodal cues for sequencing )   Supine to sit: Min guard Sit to supine: Mod assist   General bed mobility comments: Pt was able to roll to side and shift toward EOB, pt held PT's hand to rise but PT did not have to phyiscally assist otherwise  Transfers Overall transfer level: Modified independent Equipment used: Rolling walker (2 wheeled) Transfers: Sit to/from Stand Sit to Stand: Supervision         General transfer comment: Pt was able to rise to standing from standard height bed w/o assist and only minimal  cuing for appropriate UE use and sequencing  Ambulation/Gait Ambulation/Gait assistance: Supervision Gait Distance (Feet): 100 Feet Assistive device: Rolling walker (2 wheeled)       General Gait Details: prolonged bout of ambulation in the room with multiple 180 turns, on room air with sats remaining >95%.  Pt did have a few small bouts of hitting wheels on obstacles but given her vision issues actually avoided most obstacles relatively well  Stairs            Wheelchair Mobility    Modified Rankin (Stroke Patients Only)       Balance Overall balance assessment: Needs assistance Sitting-balance support: No upper extremity supported;Feet supported Sitting balance-Leahy Scale: Good     Standing balance support: Bilateral upper extremity supported Standing balance-Leahy Scale: Fair Standing balance comment: with walker pt was relatively stable/confident with standing balance                             Pertinent Vitals/Pain Pain Assessment: No/denies pain    Home Living Family/patient expects to be discharged to:: Private residence Living Arrangements: Alone Available Help at Discharge: Family;Personal care attendant;Available 24 hours/day Type of Home: House Home Access: Stairs to enter Entrance Stairs-Rails: Can reach both;Left;Right Entrance Stairs-Number of Steps: 5 steps, shortened/blind-friendly Home Layout: One level Home Equipment: Cane - single point;Shower seat - built in;Grab bars - tub/shower;Hand held shower head;Grab bars - toilet Additional Comments: per previuos documentation: Neice reports house was fully adapted including visual tape strips, bathroom, and entryway    Prior Function Level of Independence: Independent  with assistive device(s)         Comments: MOD I for ADLs and mobility using SPC. Neice assists for meals and IADLs     Hand Dominance   Dominant Hand: Right    Extremity/Trunk Assessment   Upper Extremity  Assessment Upper Extremity Assessment: Generalized weakness (age appropriate limitations)    Lower Extremity Assessment Lower Extremity Assessment: Generalized weakness (age appropriate limitations)       Communication   Communication: HOH  Cognition Arousal/Alertness: Awake/alert Behavior During Therapy: WFL for tasks assessed/performed;Flat affect Overall Cognitive Status: No family/caregiver present to determine baseline cognitive functioning Area of Impairment: Orientation;Memory;Following commands;Safety/judgement;Awareness;Problem solving                 Orientation Level: Person   Memory: Decreased recall of precautions;Decreased short-term memory Following Commands: Follows one step commands with increased time Safety/Judgement: Decreased awareness of safety;Decreased awareness of deficits   Problem Solving: Slow processing;Decreased initiation;Requires tactile cues;Requires verbal cues        General Comments      Exercises Other Exercises Other Exercises: Pt and caregiver educated re: OT role, DME recs, d/c recs, falls prevention, RW technique Other Exercises: Toileting, LBD, self-feeding/drinking, sit>sup, sit<>stand, sitting/standing balance/tolerance, ~8 ft in room mobility    Assessment/Plan    PT Assessment Patient needs continued PT services  PT Problem List Decreased strength;Decreased range of motion;Decreased activity tolerance;Decreased balance;Decreased mobility;Decreased cognition;Decreased knowledge of use of DME;Decreased safety awareness       PT Treatment Interventions DME instruction;Gait training;Stair training;Functional mobility training;Therapeutic activities;Therapeutic exercise;Balance training;Neuromuscular re-education;Cognitive remediation;Patient/family education    PT Goals (Current goals can be found in the Care Plan section)  Acute Rehab PT Goals Patient Stated Goal: go home PT Goal Formulation: With patient Time For Goal  Achievement: 04/07/20 Potential to Achieve Goals: Good    Frequency Min 2X/week   Barriers to discharge        Co-evaluation               AM-PAC PT "6 Clicks" Mobility  Outcome Measure Help needed turning from your back to your side while in a flat bed without using bedrails?: None Help needed moving from lying on your back to sitting on the side of a flat bed without using bedrails?: A Little Help needed moving to and from a bed to a chair (including a wheelchair)?: A Little Help needed standing up from a chair using your arms (e.g., wheelchair or bedside chair)?: A Little Help needed to walk in hospital room?: A Little Help needed climbing 3-5 steps with a railing? : A Little 6 Click Score: 19    End of Session Equipment Utilized During Treatment: Gait belt;Oxygen (2L, ambulated on room air with sats remaining in mid 90s) Activity Tolerance: Patient tolerated treatment well Patient left: with call bell/phone within reach;in chair;with nursing/sitter in room Nurse Communication: Mobility status PT Visit Diagnosis: Muscle weakness (generalized) (M62.81);Unsteadiness on feet (R26.81);Difficulty in walking, not elsewhere classified (R26.2)    Time: 1525-1550 PT Time Calculation (min) (ACUTE ONLY): 25 min   Charges:   PT Evaluation $PT Eval Low Complexity: 1 Low PT Treatments $Gait Training: 8-22 mins        Kreg Shropshire, DPT 03/24/2020, 5:15 PM

## 2020-03-24 NOTE — NC FL2 (Signed)
[place] LEVEL OF CARE SCREENING TOOL     IDENTIFICATION  Patient Name: Marilyn Oconnell Birthdate: [birth year] Sex: female Admission Date (Current Location): 03/22/2020  [place] and [place] Number:  Engineering geologist and Address:  [facility], [address] [place], [place] [address]      Provider Number: [id number]  Attending Physician Name and Address:  Karie Kirks, DO  Relative Name and Phone Number:       Current Level of Care: Hospital Recommended Level of Care: [place] Prior Approval Number:    Date Approved/Denied:   PASRR Number: [id number]  Discharge Plan: SNF (short term versus long term)    Current Diagnoses: Patient Active Problem List   Diagnosis Date Noted  . Advanced age 72/09/2019  . 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 Blindness 03/23/2020  . Atrial fibrillation with RVR ([place]) 03/22/2020  . Chronic pain syndrome 03/16/2020  . Closed nondisplaced fracture of surgical neck of left humerus 03/16/2020  . Chronic left-sided headaches 03/16/2020  . Occipital neuralgia of left side 03/16/2020  . Syncope 07/20/2019  . Recurrent syncope 07/19/2019  . Insomnia 03/18/2016  . Visual hallucinations 03/18/2016  . Vertigo 03/15/2016  . Paroxysmal atrial fibrillation with RVR ([place]) 11/27/2015  . Depression 11/27/2015  . Adaptation reaction 09/28/2015  . Aortic heart valve narrowing 09/28/2015  . Bilateral cataracts 09/28/2015  . Cardiac murmur 09/28/2015  . Chronic kidney disease (CKD), stage III (moderate) 09/28/2015  . History of gout 09/28/2015  . 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/2015  . OP (osteoporosis) 09/28/2015  . Atherosclerosis of coronary artery 09/08/2015  . Avitaminosis D 07/11/2015  . LBP (low back pain) 07/11/2015  . BP (high blood pressure) 07/11/2015  . Hyperlipemia 04/12/2015  . Billowing mitral valve 08/03/2014  . Fibrosis of lung ([place]) 05/25/2014  . Asthma-chronic obstructive  pulmonary disease overlap syndrome ([place]) 05/25/2014  . H/O aortic valve replacement 11/09/2004    Orientation RESPIRATION BLADDER Height & Weight     Self  O2 ([place] 2L) Continent Weight: 110 lb 9.6 oz (50.2 kg) Height:  5' (152.4 cm)  BEHAVIORAL SYMPTOMS/MOOD NEUROLOGICAL BOWEL NUTRITION STATUS      Continent Diet (heart healthy)  AMBULATORY STATUS COMMUNICATION OF NEEDS Skin   Extensive Assist Verbally Normal                       Personal Care Assistance Level of Assistance  Bathing, Feeding, Dressing Bathing Assistance: Maximum assistance Feeding assistance: Independent Dressing Assistance: Maximum assistance     Functional Limitations Info  Sight, Hearing, Speech Sight Info: Adequate Hearing Info: Adequate Speech Info: Adequate    SPECIAL CARE FACTORS FREQUENCY  PT (By licensed PT), OT (By licensed OT)     PT Frequency: 5x OT Frequency: 5x            Contractures Contractures Info: Not present    Additional Factors Info  Code Status, Allergies Code Status Info: Full Code Allergies Info: Iodine, Shellfish Allergy, Azithromycin, Sulfa Antibiotics           Current Medications (03/24/2020):  This is the current hospital active medication list Current Facility-Administered Medications  Medication Dose Route Frequency Provider Last Rate Last Admin  . acetaminophen (TYLENOL) tablet 500 mg  500 mg Oral BID PRN Swayze, Ava, DO   500 mg at 03/23/20 1435  . albuterol (PROVENTIL) (2.5 MG/3ML) 0.083% nebulizer solution 2.5 mg  2.5 mg Nebulization Q6H PRN Swayze, Ava, DO   2.5 mg  at 03/22/20 1912  . allopurinol (ZYLOPRIM) tablet 100 mg  100 mg Oral QPM Swayze, Ava, DO   100 mg at 03/23/20 1805  . aspirin EC tablet 81 mg  81 mg Oral Daily Swayze, Ava, DO   81 mg at 03/24/20 1124  . diltiazem (CARDIZEM) 125 mg in dextrose 5% 125 mL (1 mg/mL) infusion  5-15 mg/hr Intravenous Continuous Duffy Bruce, MD 5 mL/hr at 03/22/20 1631 5 mg/hr at 03/22/20 1631  . diltiazem  (CARDIZEM) 125 mg in dextrose 5% 125 mL (1 mg/mL) infusion  5-15 mg/hr Intravenous Titrated Swayze, Ava, DO   Stopped at 03/23/20 1118  . escitalopram (LEXAPRO) tablet 20 mg  20 mg Oral Daily Swayze, Ava, DO   20 mg at 03/24/20 1122  . heparin injection 5,000 Units  5,000 Units Subcutaneous Q12H Lu Duffel, RPH   5,000 Units at 03/24/20 1122  . ipratropium-albuterol (DUONEB) 0.5-2.5 (3) MG/3ML nebulizer solution 3 mL  3 mL Nebulization TID Swayze, Ava, DO   3 mL at 03/23/20 1411  . MEDLINE mouth rinse  15 mL Mouth Rinse BID Swayze, Ava, DO   15 mL at 03/24/20 1124  . metoprolol succinate (TOPROL-XL) 24 hr tablet 25 mg  25 mg Oral Once Swayze, Ava, DO      . metoprolol succinate (TOPROL-XL) 24 hr tablet 75 mg  75 mg Oral Daily Swayze, Ava, DO   75 mg at 03/24/20 1122  . mometasone-formoterol (DULERA) 200-5 MCG/ACT inhaler 2 puff  2 puff Inhalation BID Swayze, Ava, DO   2 puff at 03/23/20 0923  . ondansetron (ZOFRAN) tablet 4 mg  4 mg Oral Q6H PRN Swayze, Ava, DO       Or  . ondansetron (ZOFRAN) injection 4 mg  4 mg Intravenous Q6H PRN Swayze, Ava, DO      . senna-docusate (Senokot-S) tablet 1 tablet  1 tablet Oral QHS PRN Swayze, Ava, DO      . simvastatin (ZOCOR) tablet 20 mg  20 mg Oral QHS Swayze, Ava, DO      . sodium chloride flush (NS) 0.9 % injection 3 mL  3 mL Intravenous Q12H Swayze, Ava, DO   3 mL at 03/24/20 0932  . sorbitol 70 % solution 30 mL  30 mL Oral Daily PRN Swayze, Ava, DO      . traZODone (DESYREL) tablet 25 mg  25 mg Oral QHS Swayze, Ava, DO   25 mg at 03/23/20 2200     Discharge Medications: Please see discharge summary for a list of discharge medications.  Relevant Imaging Results:  Relevant Lab Results:   Additional Information SSN:[id number]  Eileen Stanford, LCSW

## 2020-03-24 NOTE — TOC Initial Note (Signed)
Transition of Care [facility]) - Initial/Assessment Note    Patient Details  Name: Marilyn Oconnell MRN: [id number] Date of Birth: 03-14-1924  Transition of Care [facility]) CM/SW Contact:    Eileen Stanford, LCSW Phone Number: 03/24/2020, 1:04 PM  Clinical Narrative:   CSW spoke with pt's Niece on unit. Pt's Niece states that she is the caregiver for pt--no additional family is involved. Pt's Niece expressed caregiver burnout. Pt's Niece was very emotional. Pt's niece cooks and prepares all pt's meals and spends as much time as possibly with the pt while also working and maintaining a family of her own. Pt gets a additional aid at home. Pt's Niece expressed the concern for pt's behaviors since admission. Pt' Niece states she had to redirect pt to not get out of the bed multiple times yesterday. Pt's Niece states pt became irritated and combative. Pt's Niece staets this is not pt's baseline. Pt's Niece states she is emotionally and physically unable to care for pt at home in the state she is in. Pt's Niece has been talking to Seth Bake at [facility] and Seth Bake ask that CSW send referral over. CSW will reach out to facility.               Expected Discharge Plan: Skilled Nursing Facility Barriers to Discharge: Continued Medical Work up   Patient Goals and CMS Choice Patient states their goals for this hospitalization and ongoing recovery are:: per pt's neice-- wants pt to go to snf   Choice offered to / list presented to :  (neice)  Expected Discharge Plan and Services Expected Discharge Plan: [place] Acute Care Choice: [place] Living arrangements for the past 2 months: Single Family Home                                      Prior Living Arrangements/Services Living arrangements for the past 2 months: Single Family Home Lives with:: Self, Relatives Patient language and need for interpreter reviewed:: Yes Do you feel safe going back to the place where  you live?: Yes      Need for Family Participation in Patient Care: Yes (Comment) Care giver support system in place?: Yes (comment)   Criminal Activity/Legal Involvement Pertinent to Current Situation/Hospitalization: No - Comment as needed  Activities of Daily Living Home Assistive Devices/Equipment: Environmental consultant (specify type), Shower chair without back, Bedside commode/3-in-1 ADL Screening (condition at time of admission) Patient's cognitive ability adequate to safely complete daily activities?: Yes Is the patient deaf or have difficulty hearing?: Yes Does the patient have difficulty seeing, even when wearing glasses/contacts?: No Does the patient have difficulty concentrating, remembering, or making decisions?: Yes Patient able to express need for assistance with ADLs?: Yes Does the patient have difficulty dressing or bathing?: Yes Independently performs ADLs?: Yes (appropriate for developmental age) Does the patient have difficulty walking or climbing stairs?: Yes Weakness of Legs: Both Weakness of Arms/Hands: None  Permission Sought/Granted Permission sought to share information with : Family Supports    Share Information with NAME: Sharyn Lull  Permission granted to share info w AGENCY: [employer] and exercise psychologist granted to share info w Relationship: Neice     Emotional Assessment Appearance:: Appears stated age Attitude/Demeanor/Rapport: Unable to Assess Affect (typically observed): Unable to Assess Orientation: : Oriented to Self Alcohol / Substance Use: Not Applicable Psych Involvement: No (comment)  Admission diagnosis:  Atrial fibrillation with rapid ventricular response (HCC) [I48.91] Atrial fibrillation with RVR ([place]) [I48.91] Patient Active Problem List   Diagnosis Date Noted  . Advanced age 61/09/2019  . 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 Blindness 03/23/2020  . Atrial fibrillation with RVR ([place]) 03/22/2020  . Chronic pain syndrome 03/16/2020  . Closed nondisplaced  fracture of surgical neck of left humerus 03/16/2020  . Chronic left-sided headaches 03/16/2020  . Occipital neuralgia of left side 03/16/2020  . Syncope 07/20/2019  . Recurrent syncope 07/19/2019  . Insomnia 03/18/2016  . Visual hallucinations 03/18/2016  . Vertigo 03/15/2016  . Paroxysmal atrial fibrillation with RVR ([place]) 11/27/2015  . Depression 11/27/2015  . Adaptation reaction 09/28/2015  . Aortic heart valve narrowing 09/28/2015  . Bilateral cataracts 09/28/2015  . Cardiac murmur 09/28/2015  . Chronic kidney disease (CKD), stage III (moderate) 09/28/2015  . History of gout 09/28/2015  . 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/2015  . OP (osteoporosis) 09/28/2015  . Atherosclerosis of coronary artery 09/08/2015  . Avitaminosis D 07/11/2015  . LBP (low back pain) 07/11/2015  . BP (high blood pressure) 07/11/2015  . Hyperlipemia 04/12/2015  . Billowing mitral valve 08/03/2014  . Fibrosis of lung ([place]) 05/25/2014  . 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 H/O aortic valve replacement 11/09/2004   PCP:  Trinna Post, PA-C Pharmacy:   [place], [place] - [address] [place] [address] Phone: [phone] Fax: [phone]  [place], [place], [address] [place], [address] [place] [address] Phone: [phone] Fax: [phone]     Social Determinants of Health (SDOH) Interventions    Readmission Risk Interventions No flowsheet data found.

## 2020-03-24 NOTE — Evaluation (Addendum)
Occupational Therapy Evaluation Patient Details Name: Marilyn Oconnell MRN: [id number] DOB: 04-04-1924 Today's Date: 03/24/2020    History of Present Illness  The patient is a 86 yr old woman who carries a medical history significant for blindness, [name], arthritis, CHF, COPD, Coronary artery disease, Hyperlipidemia, hypertension. Pt presents with Atrial fibrillation with RVR.    Clinical Impression   [name] was seen for OT evaluation this date. Prior to hospital admission, pt was Independent in ADLs c assist from niece for IADLs. Pt lives alone c niece available PRN however per conversation c niece she is traveling this month and has been unable to arrange care. Pt presents to acute OT demonstrating impaired ADL performance, functional cognition, and functional mobility 2/2 decreased safety awareness, functional balance/strength deficits, decreased activity tolerance, and poor command following.   Pt currently requires SETUP + MAX VCs self-drinking at bed level. MIN A adjust B socks at bed level - anticipate MOD A for LBD sit<>stand. MIN A + RW for bed<>BSC t/f. MAX A  + RW for pericare - assist for thoroughness. Pt would benefit from skilled OT to address noted impairments and functional limitations (see below for any additional details) in order to maximize safety and independence while minimizing falls risk and caregiver burden. Pt is unsafe to return home alone, recommend STR upon hospital discharge to maximize pt safety and return to PLOF.     Follow Up Recommendations  SNF    Equipment Recommendations       Recommendations for Other Services       Precautions / Restrictions Precautions Precautions: Fall (1:1 sitter) Restrictions Weight Bearing Restrictions: No      Mobility Bed Mobility Overal bed mobility: Needs Assistance Bed Mobility: Supine to Sit;Sit to Supine;Rolling Rolling: Supervision (multimodal cues for sequencing )   Supine to sit: Min  guard;HOB elevated Sit to supine: Mod assist   General bed mobility comments: Assist for BLE management on return to bed   Transfers Overall transfer level: Needs assistance Equipment used: Rolling walker (2 wheeled) Transfers: Sit to/from Stand Sit to Stand: Min assist              Balance Overall balance assessment: Needs assistance Sitting-balance support: No upper extremity supported;Feet supported Sitting balance-Leahy Scale: Good     Standing balance support: Bilateral upper extremity supported Standing balance-Leahy Scale: Fair                             ADL either performed or assessed with clinical judgement   ADL Overall ADL's : Needs assistance/impaired                                       General ADL Comments: SETUP + MAX VCs self-drinking at bed level. MIN A adjust B socks at bed level - anticipate MOD A for LBD sit<>stand. MIN A + RW for bed<>BSC t/f. MAX A  + RW for pericare  - assist for thoroughness.      Vision Baseline Vision/History: Legally blind Patient Visual Report:  (Per neice, pt can see outlines only. Pt has Equities trader )       Perception     Praxis      Pertinent Vitals/Pain Pain Assessment: No/denies pain     Hand Dominance Right   Extremity/Trunk Assessment Upper Extremity Assessment Upper Extremity Assessment: Generalized  weakness   Lower Extremity Assessment Lower Extremity Assessment: Generalized weakness       Communication Communication Communication: HOH   Cognition Arousal/Alertness: Awake/alert Behavior During Therapy: WFL for tasks assessed/performed Overall Cognitive Status: Impaired/Different from baseline Area of Impairment: Orientation;Memory;Following commands;Safety/judgement;Awareness;Problem solving                 Orientation Level: Person   Memory: Decreased recall of precautions;Decreased short-term memory Following Commands: Follows one step commands with  increased time Safety/Judgement: Decreased awareness of safety;Decreased awareness of deficits   Problem Solving: Slow processing;Decreased initiation;Requires tactile cues;Requires verbal cues     General Comments       Exercises Exercises: Other exercises Other Exercises Other Exercises: Pt and caregiver educated re: OT role, DME recs, d/c recs, falls prevention, RW technique Other Exercises: Toileting, LBD, self-feeding/drinking, sit>sup, sit<>stand, sitting/standing balance/tolerance, ~8 ft in room mobility    Shoulder Instructions      Home Living Family/patient expects to be discharged to:: Private residence Living Arrangements: Alone Available Help at Discharge:  (Neice typically assists PRN but will be traveling this month) Type of Home: House Home Access: Stairs to enter [employer] of Steps: 5 steps, shortened/blind-friendly (~2-3 inch steps ) Entrance Stairs-Rails: Can reach both;Left;Right Home Layout: One level     Bathroom Shower/Tub: Occupational psychologist: Handicapped height Bathroom Accessibility: Yes   Home Equipment: [place] - single point;Shower seat - built in;Grab bars - tub/shower;Hand held shower head;Grab bars - toilet   Additional Comments: Neice reports house was fully adapted including visual tape strips, bathroom, and entryway      Prior Functioning/Environment Level of Independence: Independent with assistive device(s)        Comments: MOD I for ADLs and mobility using SPC. Neice assists for meals and IADLs        OT Problem List: Decreased strength;Decreased range of motion;Decreased activity tolerance;Impaired balance (sitting and/or standing);Impaired vision/perception;Decreased cognition;Decreased safety awareness;Decreased knowledge of use of DME or AE      OT Treatment/Interventions: Self-care/ADL training;Therapeutic exercise;Energy conservation;DME and/or AE instruction;Therapeutic activities;Patient/family  education;Balance training    OT Goals(Current goals can be found in the care plan section) Acute Rehab OT Goals Patient Stated Goal: To return to PLOF OT Goal Formulation: With family Time For Goal Achievement: 04/07/20 Potential to Achieve Goals: Good ADL Goals Pt Will Perform Eating: with set-up;sitting;with supervision Pt Will Perform Lower Body Dressing: with set-up;with min guard assist;sit to/from stand (c LRAD PRN) Pt Will Transfer to Toilet: with min guard assist;ambulating;bedside commode (c LRAD PRN)  OT Frequency: Min 1X/week   Barriers to D/C: Decreased caregiver support          Co-evaluation              AM-PAC OT "6 Clicks" Daily Activity     Outcome Measure Help from another person eating meals?: A Little Help from another person taking care of personal grooming?: A Little Help from another person toileting, which includes using toliet, bedpan, or urinal?: A Lot Help from another person bathing (including washing, rinsing, drying)?: A Lot Help from another person to put on and taking off regular upper body clothing?: A Little Help from another person to put on and taking off regular lower body clothing?: A Lot 6 Click Score: 15   End of Session Equipment Utilized During Treatment: Rolling walker  Activity Tolerance: Patient tolerated treatment well Patient left: in bed;with call bell/phone within reach;with bed alarm set;with nursing/sitter in room  OT Visit  Diagnosis: Unsteadiness on feet (R26.81);Other abnormalities of gait and mobility (R26.89);Low vision, both eyes (H54.2)                Time: 1119-1140 OT Time Calculation (min): 21 min Charges:  OT General Charges $OT Visit: 1 Visit OT Evaluation $OT Eval Moderate Complexity: 1 Mod OT Treatments $Self Care/Home Management : 8-22 mins  Dessie Coma, M.S. OTR/L  03/24/20, 1:55 PM

## 2020-03-24 NOTE — Care Management Important Message (Signed)
Important Message  Patient Details  Name: Marilyn Oconnell MRN: [id number] Date of Birth: May 13, 1924   Medicare Important Message Given:  Yes  Initial Medicare IM given by Patient Access Associate on 03/23/2020 at 9:53am.     Dannette Barbara 03/24/2020, 8:35 AM

## 2020-03-24 NOTE — Plan of Care (Signed)
  Problem: Health Behavior/Discharge Planning: Goal: Ability to manage health-related needs will improve Outcome: Progressing   Problem: Education: Goal: Knowledge of General Education information will improve Description: Including pain rating scale, medication(s)/side effects and non-pharmacologic comfort measures Outcome: Progressing   Problem: Clinical Measurements: Goal: Will remain free from infection Outcome: Progressing   

## 2020-03-24 NOTE — Progress Notes (Signed)
Observed pt coughing while eating and drinking on several occasions. Speech eval placed. Will continue to monitor.

## 2020-03-24 NOTE — Progress Notes (Signed)
PROGRESS NOTE  Marilyn Oconnell ZHY:[id number] DOB: 14-Mar-1924 DOA: 03/22/2020 PCP: Trinna Post, PA-C  Brief History    The patient is a 86 yr old woman who carries a medical history significant for blindness, [name], arthritis, CHF, COPD, Coronary artery disease, Hyperlipidemia, hypertension. EMS was called when the patient told her niece that she had had palpitations for more than 2 hours. EMS found her to have heart rate of 160.   [facility] were consulted to admit the patient for further evaluation and treatment. She was admitted to a telemetry bed on a diltiazem drip. Overnight she converted to sinus rhythm. She will be weaned off of diltiazem today. She has been continued on metoprolol as at home.   Overnight the patient has been extremely confused, agitated and combative. Today she is exhausted and sleeping.   Consultants  . None  Antibiotics   Anti-infectives (From admission, onward)   None     Subjective  The patient is sleeping soundly. No new complaints. Niece is at bedside.  Objective   Vitals:  Vitals:   03/24/20 1434 03/24/20 1559  BP:  126/71  Pulse:  68  Resp:  17  Temp:  (!) 97.5 F (36.4 C)  SpO2: 100% 100%   Exam:  Constitutional:  . The patient is sleeping soundly. Not awakened. No acute distress. Respiratory:  . No increased work of breathing. . No wheezes, rales, or rhonchi . No tactile fremitus Cardiovascular:  . Regular rate and rhythm . No murmurs, ectopy, or gallups. . No lateral PMI. No thrills. Abdomen:  . Abdomen is soft, non-tender, non-distended . No hernias, masses, or organomegaly . Normoactive bowel sounds.  Musculoskeletal:  . No cyanosis, clubbing, or edema Skin:  . No rashes, lesions, ulcers . palpation of skin: no induration or nodules Neurologic:  . Unable to evaluate due to the patient's inability to cooperate with exam. Psychiatric:  . Pt is sleeping now, but was agitated, confused, and  aggressive over night. Still having visual hallucinations.  . Unlike her usual, she is unable to determine whether these hallucinations are real or not.  I have personally reviewed the following:   Today's Data  . Vitals, BMP, CBC  Cardiology Data  . EKG . Echocardiogram: Severe AS  Scheduled Meds: . allopurinol  100 mg Oral QPM  . aspirin EC  81 mg Oral Daily  . escitalopram  20 mg Oral Daily  . heparin injection (subcutaneous)  5,000 Units Subcutaneous Q12H  . ipratropium-albuterol  3 mL Nebulization TID  . mouth rinse  15 mL Mouth Rinse BID  . metoprolol succinate  25 mg Oral Once  . metoprolol succinate  75 mg Oral Daily  . mometasone-formoterol  2 puff Inhalation BID  . simvastatin  20 mg Oral QHS  . sodium chloride flush  3 mL Intravenous Q12H  . traZODone  25 mg Oral QHS   Continuous Infusions: . diltiazem (CARDIZEM) infusion 5 mg/hr (03/22/20 1631)  . 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 She is on metoprolol at home. She will be admitted to a telemetry bed on the drip and be transitioned over to oral medications for rate control. The patient is not on anticoagulation at home. This has converted to sinus. Will monitor on metoprolol. I have discussed anticoagulation with the patient's niece. It seems that due to the patient's blindness and debility she does present a fall risk. At this point anticoagulation is not a good idea. I have encouraged the niece to discuss it with the patient's PCP in the future.  Severe aortic stenosis: consult  cardiology  Deliriuim: Delirium precautions, minimize delirium inducing medications, antipsychotics for agitation, Mobilization.   Elevated Troponin: Elevated, but stable. Likely due to demand ischemia. Echocardiogram demonstrates no wall motion abnormalities, but does show elevated PASP and severe aortic stenosis.   Hypertension: Blood pressure improved on metoprolol.  Chronic pain: Continue home meds   [name]: Noted.   Idiopathic Pulmonary Fibrosis: Noted.  Debility: PT/OT to eval and treat.  CKD IVa   I have seen and examined this patient myself. I have spent 45 minutes in her evaluation and care.   DVT Prophylaxis: Lovenox CODE STATUS: Full Code Family Communication: Niece is at bedside Disposition: Patient is from home. Anticipate discharge to SNF. Barrier to discharge: Delirium, and need for PT/OT eval.   Status is: Inpatient   Remains inpatient appropriate because:IV treatments appropriate due to intensity of illness or inability to take PO   Dispo: The patient is from: Home              Anticipated d/c is to: SNF              Anticipated d/c date is: tbd              Patient currently is not medically stable to d/c due to acute delirium.   Christiano Blandon, DO [facility] Direct contact: see [email or web address]  7PM-7AM contact night coverage as above 03/24/2020, 4:49 PM  LOS: 1 day

## 2020-03-24 NOTE — Progress Notes (Signed)
Patient sat on bsc and had a bm & urine. Post bladder can obtained. 20ml urine recorded. Will continue to monitor.

## 2020-03-24 NOTE — Plan of Care (Signed)
  Problem: Activity: Goal: Risk for activity intolerance will decrease Outcome: Progressing   Problem: Safety: Goal: Ability to remain free from injury will improve Outcome: Progressing   Problem: Clinical Measurements: Goal: Ability to maintain clinical measurements within normal limits will improve Outcome: Progressing   

## 2020-03-24 NOTE — Progress Notes (Signed)
PT Cancellation Note  Patient Details Name: Marilyn Oconnell MRN: [id number] DOB: 29-Jun-1924   Cancelled Treatment:    Reason Eval/Treat Not Completed: Patient's level of consciousness Pt agitated last night, received two separate doses of Haldol.  Spoke with nursing this AM who states that pt is still "out of it" and is not currently appropriate for PT; hope to be able to see her later today should she become mentally appropriate.  Kreg Shropshire, DPT 03/24/2020, 8:39 AM

## 2020-03-24 NOTE — Progress Notes (Addendum)
Head CT resulted. Notify NP Randol Kern. Will continue to monitor.  Update 0000: NP ordered MRI. Will continue to monitor.

## 2020-03-25 ENCOUNTER — Inpatient Hospital Stay: Payer: Medicare Other

## 2020-03-25 LAB — URINE CULTURE: Culture: <10000 — AB

## 2020-03-25 IMAGING — MR MR HEAD W/O CM
11 series · 40 of 48 positions shown · non-contrast
Comparison: Head CT [DATE] and earlier.

CLINICAL DATA: [AGE] female with unexplained altered mental
status. Age indeterminate right basal ganglia lacunar infarct on
head CT yesterday.

EXAM:
MRI HEAD WITHOUT CONTRAST
TECHNIQUE: Multiplanar, multiecho pulse sequences of the brain and surrounding
structures were obtained without intravenous contrast.

[Series 5: ax dwi_tracew · axial · 3.0mm · 0.60mm/px · z∈[-73,+64]mm · 4 of 48 slices shown]
[im 1/48]
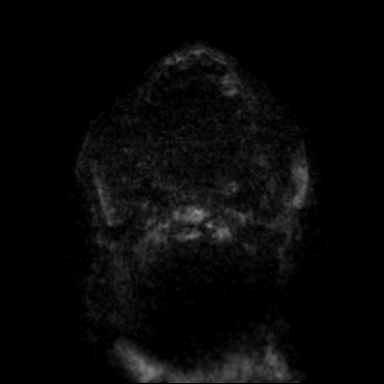
[im 16/48]
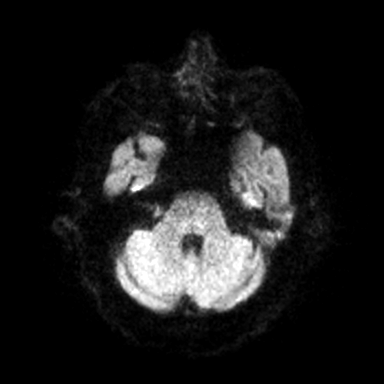
[im 32/48]
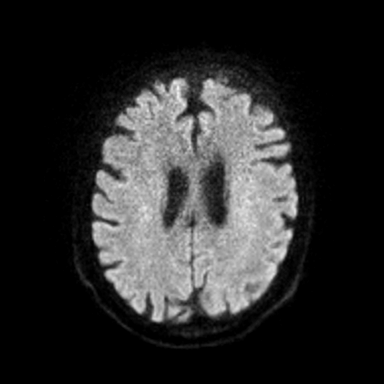
[im 48/48]
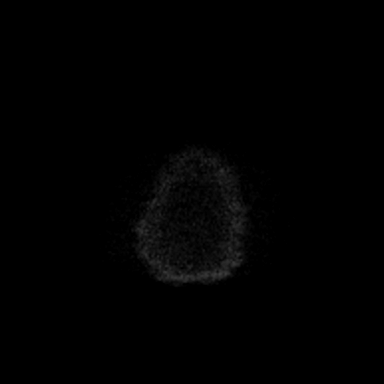

[Series 6: ax dwi_adc · axial · 3.0mm · 0.60mm/px · z∈[-73,+64]mm · 4 of 48 slices shown]
[im 1/48]
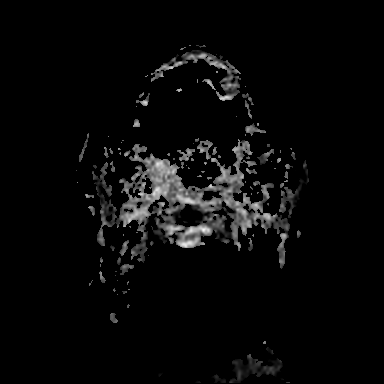
[im 16/48]
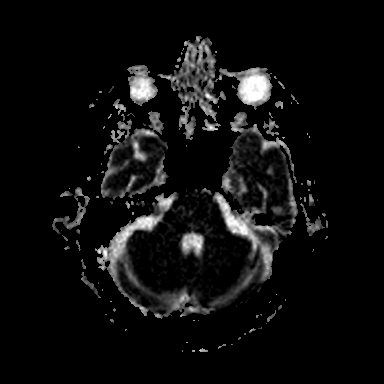
[im 32/48]
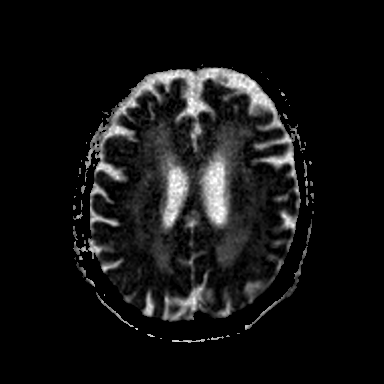
[im 48/48]
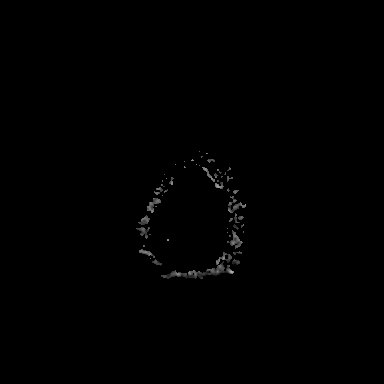

[Series 7: cor dwi_tracew · coronal · 5.0mm · 0.60mm/px · 3 of 38 slices shown]
[im 1/38]
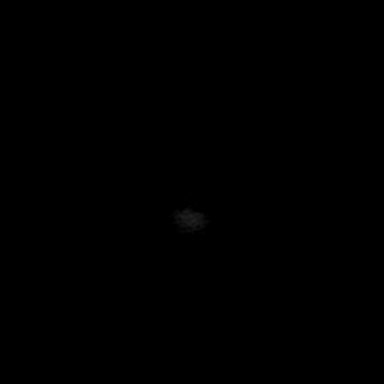
[im 19/38]
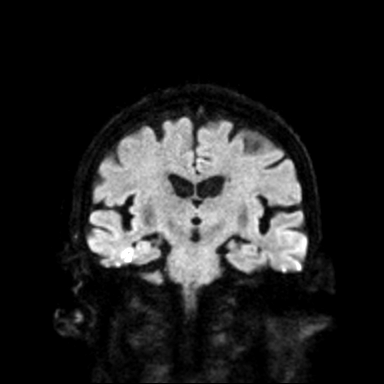
[im 38/38]
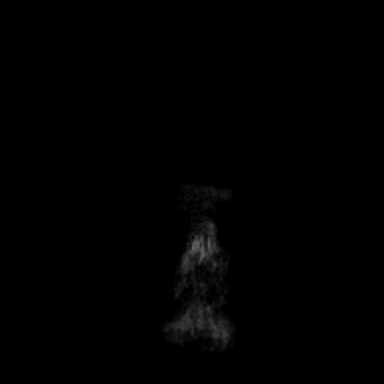

[Series 8: cor dwi_adc · coronal · 5.0mm · 0.60mm/px · 3 of 36 slices shown]
[im 1/36]
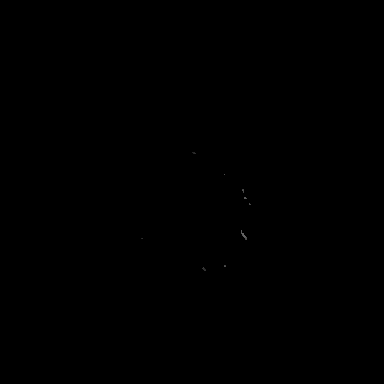
[im 18/36]
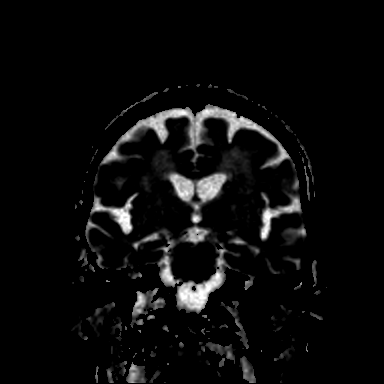
[im 36/36]
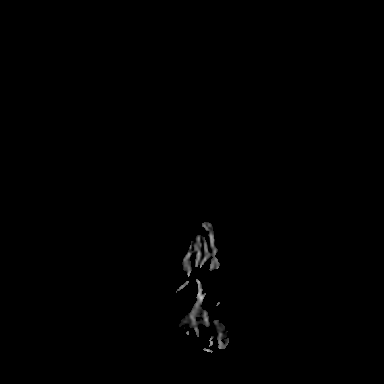

[Series 9: T1 · sagittal · 5.0mm · 0.62mm/px · 2 of 21 slices shown (1 of 2)]
[im 1/21]
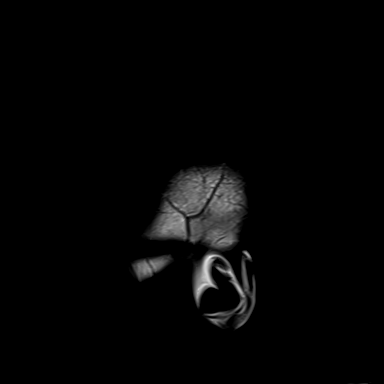
[im 21/21]
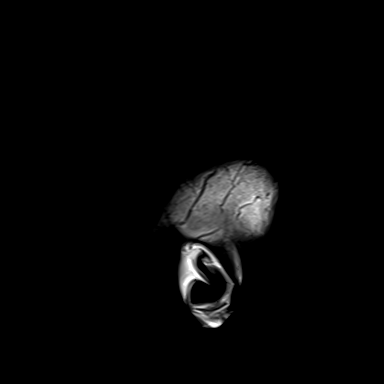

[Series 10: T2 · axial · 5.0mm · 0.53mm/px · z∈[-71,+57]mm · 2 of 25 slices shown (1 of 2)]
[im 1/25]
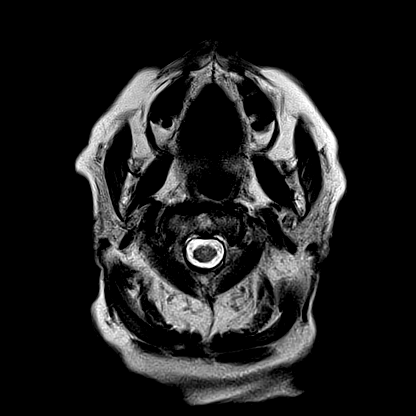
[im 25/25]
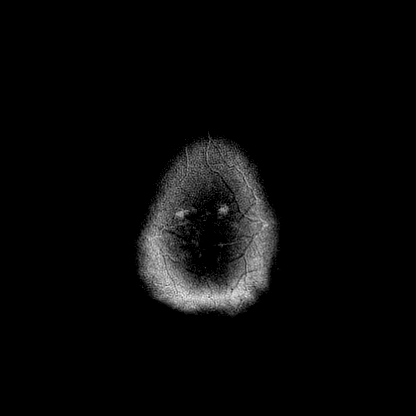

[Series 12: pha_images · axial · 3.0mm · 0.90mm/px · z∈[-73,+62]mm · 5 of 52 slices shown]
[im 1/52]
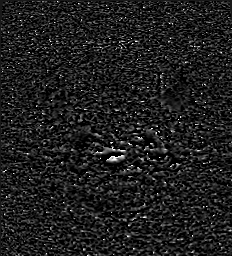
[im 13/52]
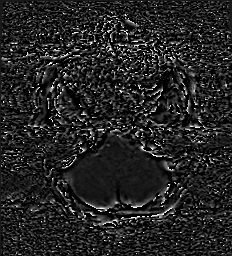
[im 26/52]
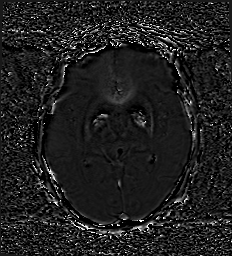
[im 39/52]
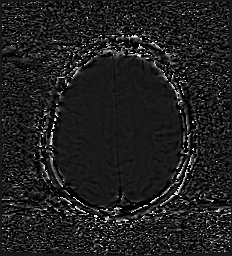
[im 52/52]
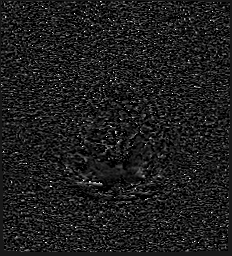

[Series 13: swi_images · axial · 3.0mm · 0.90mm/px · z∈[-73,-41]mm · 2 of 52 slices shown]
[im 1/52]
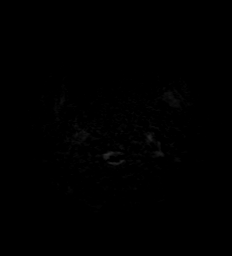
[im 13/52]
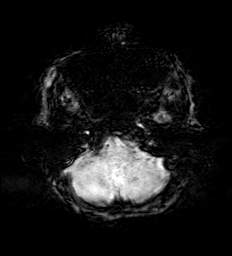

[Series 15: FLAIR · axial · 3.0mm · 0.53mm/px · z∈[-78,+65]mm · 5 of 55 slices shown]
[im 1/55]
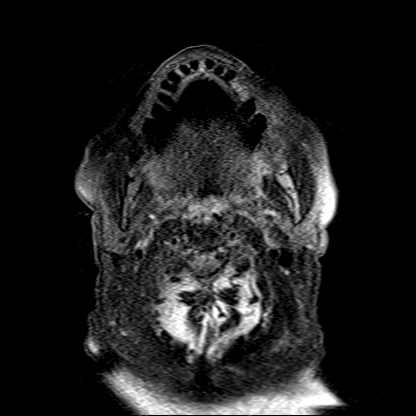
[im 14/55]
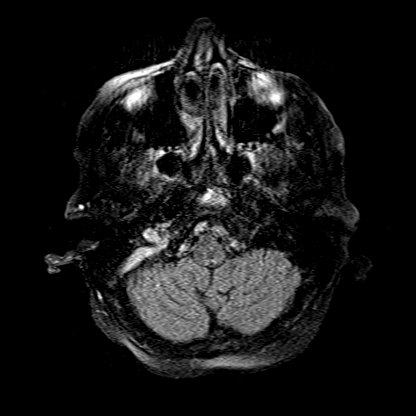
[im 28/55]
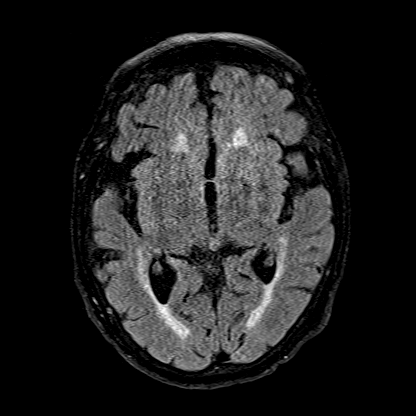
[im 41/55]
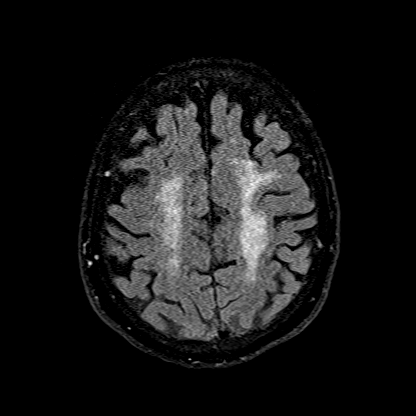
[im 55/55]
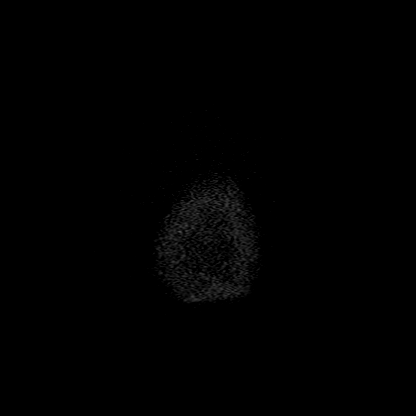

[Series 16: T1 · axial · 1.0mm · 0.98mm/px · z∈[-59,+67]mm · 8 of 144 slices shown (2 of 2)]
[im 1/144]
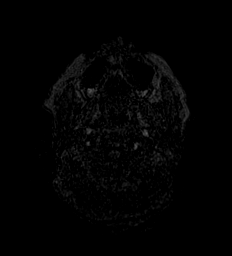
[im 24/144]
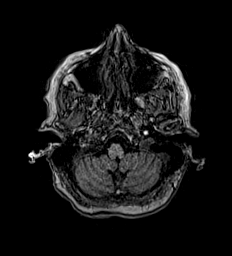
[im 48/144]
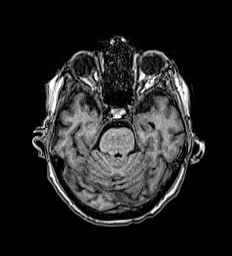
[im 60/144]
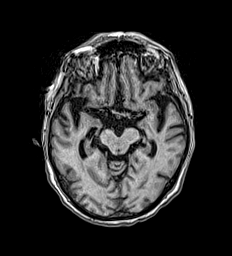
[im 84/144]
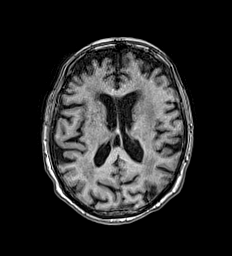
[im 96/144]
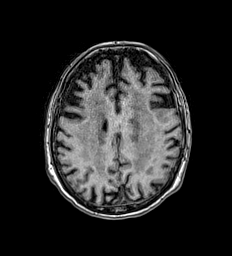
[im 120/144]
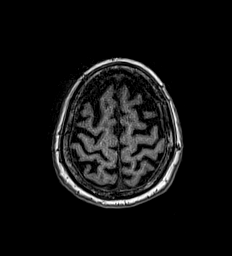
[im 144/144]
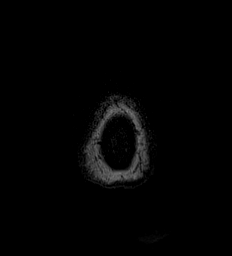

[Series 17: T2 · coronal · 5.0mm · 0.57mm/px · 2 of 27 slices shown (2 of 2)]
[im 1/27]
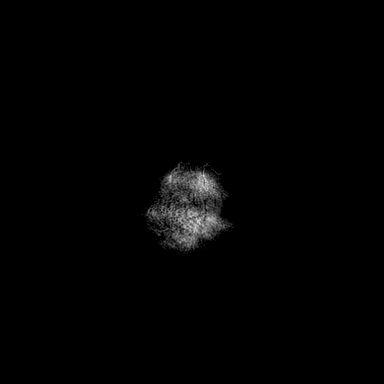
[im 27/27]
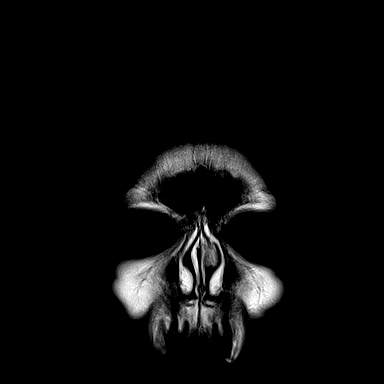

[40 of 48 positions shown; findings below may reference images not displayed]

FINDINGS: Brain: No restricted diffusion or evidence of acute infarction.

Mineralization and chronic T2/FLAIR heterogeneity in the bilateral
basal ganglia and thalami, including the small area on the right by
CT yesterday. Superimposed confluent white matter T2 and FLAIR
hyperintensity. Mild to moderate similar T2 signal heterogeneity in
the pons. No cortical encephalomalacia or definite chronic cerebral
blood products.

No midline shift, mass effect, ventriculomegaly, extra-axial
collection or acute intracranial hemorrhage. Cervicomedullary
junction and pituitary are within normal limits.

Vascular: Major intracranial vascular flow voids are preserved; slow
flow is suspected in the right sigmoid sinus and IJ bulb.

Skull and upper cervical spine: Negative for age visible cervical
spine, bone marrow signal.

Sinuses/Orbits: Postoperative changes to both globes, otherwise
negative orbits. Paranasal sinuses and mastoids are stable and well
pneumatized.

Other: There is a 9 mm nodular soft tissue mass at the right
cerebellopontine angle associated with loss of the normal right
internal auditory canal appearance (series 10, image 8 and series
16, image 39. The contralateral left internal auditory structures
appear grossly normal.

Scalp and face soft tissues appear negative.
IMPRESSION: 1. No acute or subacute ischemia identified. Moderately advanced for
age chronic small vessel disease, including at the right basal
ganglia seen by CT yesterday.

2. Positive for a small 9-10 mm mass of the right internal auditory
canal most compatible with Vestibular Schwannoma. Query associated
sensorineural hearing loss or tinnitus.

## 2020-03-25 NOTE — Evaluation (Signed)
Clinical/Bedside Swallow Evaluation Patient Details  Name: Marilyn Oconnell MRN: [id number] Date of Birth: 1924-07-09  Today's Date: 03/25/2020 Time: SLP Start Time (ACUTE ONLY): 1150 SLP Stop Time (ACUTE ONLY): 1250 SLP Time Calculation (min) (ACUTE ONLY): 60 min  Past Medical History:  Past Medical History:  Diagnosis Date  . Arthritis   . CHF (congestive heart failure) ([place])   . COPD (chronic obstructive pulmonary disease) ([place])   . Coronary artery disease   . Hypercholesteremia   . Hypertension    Past Surgical History:  Past Surgical History:  Procedure Laterality Date  . AORTIC VALVE REPLACEMENT  2006  . DILATION AND CURETTAGE OF UTERUS  1988  . SHOULDER SURGERY  09/1999  . SIGMOIDOSCOPY  1992   HPI:  Pt is a 86 yr old woman who carries a medical history significant for blindness, [name], arthritis, CHF, COPD, Coronary artery disease, Hyperlipidemia, hypertension. EMS was called when the patient told her niece that she had had palpitations for more than 2 hours. EMS found her to have heart rate of 160. During this admit, patient has been extremely confused, agitated and combative intermittently requiring a Sitter, now drowsy. NSG reported coughing w/ thin liquids. MD has ordered Delirium precautions, minimize delirium inducing medications, antipsychotics for agitation.  MRI revealed: "No acute or subacute ischemia identified. Moderately advanced for age chronic small vessel disease".   Assessment / Plan / Recommendation Clinical Impression  Pt appears to present w/ oropharyngeal phase dysphagia impacted by Cognitive awareness and attention to task. Noted chart notes indicating potential Delirium during this admit; also noted MRI results. Pt appeared to adequately tolerate trials of a modified diet consistency w/ no immediate, consistent, overt clinical s/s of aspiration noted. With a modified diet and following aspiration precautions, pt's risk for aspiration is  reduced. Pt requires feeding support at meals d/t Vision decline and current illness, Delirium. A Sitter is present currently. Pt exhibited min increased oral phase time w/ increased texture trials, therefore, a more pureed-minced foods diet would be recommended for ease and safety of pt's oral intake. During sips of thin liquids, pt has exhibited overt coughing fairly consistently w/ NSG this morning -- suspect drowsiness and Delirium/Cognitive decline may be impacting this. Pt appeared to exhibit much reduced overt clinical signs of pharyngeal phase dysphagia w/ trials of Nectar consistency liquids. OM exam appeared [facility]. Feeding Support given d/t Vision decline -- but, pt was encouraged and supported in holding her own Cup for drinking to increase awareness and safety.  Recommend a Pureed diet w/ Nectar liquids at this time of pt's acute illness and admit and Advanced Age; aspiration precautions; Pills in Puree (crushed as able); feeding support at all meals; reduce distractions around meals and have pt assist as much as possible. ST services will continue to f/u w/ pt's status while admitted.  SLP Visit Diagnosis: Dysphagia, oropharyngeal phase (R13.12) (Cognitive decline)    Aspiration Risk  Mild aspiration risk;Risk for inadequate nutrition/hydration    Diet Recommendation  Dysphagia level 1 (puree) w/ gravies; Nectar consistency liquids. General aspiration precautions and feeding support at meals but encourage pt to Hold Cup to drink.   Medication Administration: Crushed with puree (for safe swallowing)    Other  Recommendations Recommended Consults:  (Dietician f/u; Palliative Care consult for [place]) Oral Care Recommendations: Oral care BID;Oral care before and after PO;Staff/trained caregiver to provide oral care Other Recommendations: Order thickener from pharmacy;Prohibited food (jello, ice cream, thin soups);Remove water pitcher;Have oral suction available  Follow up Recommendations  (TBD)       Frequency and Duration min 3x week  2 weeks       Prognosis Prognosis for Safe Diet Advancement: Fair Barriers to Reach Goals: Cognitive deficits;Behavior (Advanced Age)      Swallow Study   General Date of Onset: 03/22/20 HPI: Pt is a 86 yr old woman who carries a medical history significant for blindness, [name], arthritis, CHF, COPD, Coronary artery disease, Hyperlipidemia, hypertension. EMS was called when the patient told her niece that she had had palpitations for more than 2 hours. EMS found her to have heart rate of 160. During this admit, patient has been extremely confused, agitated and combative intermittently requiring a Sitter, now drowsy. NSG reported coughing w/ thin liquids. MD has ordered Delirium precautions, minimize delirium inducing medications, antipsychotics for agitation.  MRI revealed: "No acute or subacute ischemia identified. Moderately advanced for age chronic small vessel disease". Type of Study: Bedside Swallow Evaluation Previous Swallow Assessment: none noted Diet Prior to this Study: Regular;Thin liquids Temperature Spikes Noted: No (wbc 11.0) Respiratory Status: Nasal cannula (2L) History of Recent Intubation: No Behavior/Cognition: Cooperative;Pleasant mood;Lethargic/Drowsy;Requires cueing (min ) Oral Cavity Assessment: Within Functional Limits Oral Care Completed by SLP: Recent completion by staff Oral Cavity - Dentition: Adequate natural dentition Vision: Impaired for self-feeding (baseline vision deficits) Self-Feeding Abilities: Needs assist;Needs set up;Total assist Patient Positioning: Upright in bed (needs positioning support) Baseline Vocal Quality: Normal;Low vocal intensity Volitional Cough:  (adequate) Volitional Swallow: Able to elicit    Oral/Motor/Sensory Function Overall Oral Motor/Sensory Function: Within functional limits   Ice Chips Ice chips: Not tested   Thin Liquid Thin Liquid: Impaired (per  NSG) Pharyngeal  Phase Impairments: Cough - Immediate (per NSG)    Nectar Thick Nectar Thick Liquid: Impaired Presentation: Cup;Self Fed (supported fully; 7 trials) Oral Phase Impairments:  (n/a) Oral phase functional implications:  (n/a) Pharyngeal Phase Impairments: Suspected delayed Swallow (audible swallowing) Other Comments: NSG noted same when giving Nectar liquids as well.  Coughing x1 w/ larger cup sip; no other coughing or throat clearing noted   Honey Thick Honey Thick Liquid: Not tested   Puree Puree: Within functional limits Presentation: Spoon (fed; 10+ trials)   Solid     Solid: Impaired Presentation: Spoon (fed; 4 trials) Oral Phase Impairments: Impaired mastication (increased time) Oral Phase Functional Implications: Impaired mastication;Prolonged oral transit Pharyngeal Phase Impairments:  (none) Other Comments: increased texture       Orinda Kenner, MS, CCC-SLP [place] [place] 03/25/2020,1:45 PM

## 2020-03-25 NOTE — TOC Progression Note (Signed)
Transition of Care [facility]) - Progression Note    Patient Details  Name: Marilyn Oconnell MRN: [id number] Date of Birth: [birth year]  Transition of Care [facility]) CM/SW [place], LCSW Phone Number: 03/25/2020, 10:51 AM  Clinical Narrative:    Patient requested [facility], referral was made and bed has been offered. Patient PASSR#  [id number] A Patient is set to go to [facility] when medically stable for discharge.   Expected Discharge Plan: [place] Barriers to Discharge: Continued Medical Work up  Expected Discharge Plan and Services Expected Discharge Plan: [place] Choice: [place] arrangements for the past 2 months: Single Family Home                                       Social Determinants of Health (SDOH) Interventions    Readmission Risk Interventions No flowsheet data found.

## 2020-03-25 NOTE — Progress Notes (Addendum)
PROGRESS NOTE  Marilyn Oconnell STM:[id number] DOB: 1924/07/06 DOA: 03/22/2020 PCP: Trinna Post, PA-C  Brief History    The patient is a 86 yr old woman who carries a medical history significant for blindness, [name], arthritis, CHF, COPD, Coronary artery disease, Hyperlipidemia, hypertension. EMS was called when the patient told her niece that she had had palpitations for more than 2 hours. EMS found her to have heart rate of 160.   [facility] were consulted to admit the patient for further evaluation and treatment. She was admitted to a telemetry bed on a diltiazem drip. Overnight she converted to sinus rhythm. She will be weaned off of diltiazem today. She has been continued on metoprolol as at home.   Overnight on the night of 7/1-7/2 the patient had been extremely confused, agitated and combative. She has pretty much slept since then. Last night was quiet.  Consultants  None  Antibiotics   Anti-infectives (From admission, onward)    None      Subjective  The patient is sleeping. She awakens to voice. No new complaints. Niece is at bedside.  Objective   Vitals:  Vitals:   03/25/20 0744 03/25/20 0934  BP: (!) 191/64   Pulse: 67 80  Resp: 19   Temp: 98.4 F (36.9 C)   SpO2: 100%    Exam:  Constitutional:  The patient is sleeping, but awakens easily. She is oriented x 2. She is appropriate. No acute distress. She got up to use the bedside commode without difficulty with the help of her niece. Respiratory:  No increased work of breathing. No wheezes, rales, or rhonchi No tactile fremitus Cardiovascular:  Regular rate and rhythm No murmurs, ectopy, or gallups. No lateral PMI. No thrills. Abdomen:  Abdomen is soft, non-tender, non-distended No hernias, masses, or organomegaly Normoactive bowel sounds.  Musculoskeletal:  No cyanosis, clubbing, or edema Skin:  No rashes, lesions, ulcers palpation of skin: no induration or  nodules Neurologic:  CNII- XII grossly intact. Motor and sensory intact Speech intact Psychiatric:  Awake, alert, and oriented x 3.   I have personally reviewed the following:   [place]  Cardiology Data  EKG Echocardiogram: Severe AS MRI: No acute sor subacute ischemia identified.  Moderately advanced for age chronic small vessel disease including at the right basal ganglia as demonstrated on CT head. Positive for a small 9-10 mass of the right internal auditory canal consistent with a vestibular Schwannoma.  Scheduled Meds:  allopurinol  100 mg Oral QPM   aspirin EC  81 mg Oral Daily   escitalopram  20 mg Oral Daily   heparin injection (subcutaneous)  5,000 Units Subcutaneous Q12H   ipratropium-albuterol  3 mL Nebulization TID   mouth rinse  15 mL Mouth Rinse BID   metoprolol succinate  25 mg Oral Once   metoprolol succinate  75 mg Oral Daily   mometasone-formoterol  2 puff Inhalation BID   QUEtiapine  25 mg Oral q AM   QUEtiapine  50 mg Oral QHS   simvastatin  20 mg Oral QHS   sodium chloride flush  3 mL Intravenous Q12H   Continuous Infusions:  diltiazem (CARDIZEM) infusion 5 mg/hr (03/22/20 1631)   diltiazem (CARDIZEM) infusion Stopped (03/23/20 1118)    Active Problems:   BP (high blood pressure)   Chronic kidney disease (CKD), stage III (moderate)   Fibrosis of lung (HCC)   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 She is on metoprolol at home. She will be admitted to a telemetry bed on the drip and be transitioned over to oral medications for rate control. The patient is not on anticoagulation at home. This has converted to sinus. Will monitor on metoprolol. I have discussed anticoagulation with the  patient's niece. It seems that due to the patient's blindness and debility she does present a fall risk. At this point anticoagulation is not a good idea. I have encouraged the niece to discuss it with the patient's PCP in the future.  Severe aortic stenosis: To be followed up by cardiology as outpatient.  Deliriuim: Delirium precautions, minimize delirium inducing medications and mobilization. Will stop antipsychotics as pt seems overly sedated.  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  Elevated Troponin: Elevated, but stable. Likely due to demand ischemia. Echocardiogram demonstrates no wall motion abnormalities, but does show elevated PASP and severe aortic stenosis.  Diastolic heart Failure: Echocardiogram demonstrated Grade I diastolic dysfunction. Monitor volume status.   Hypertension: Blood pressure improved on metoprolol.  Chronic pain: Continue home meds   [name]: Noted.   Idiopathic Pulmonary Fibrosis: Noted.  Debility: PT/OT to eval and treat.  CKD IVa   I have seen and examined this patient myself. I have spent 38 minutes in her evaluation and care.   DVT Prophylaxis: Lovenox CODE STATUS: Full Code Family Communication: Niece is at bedside Disposition: Patient is from home. Anticipate discharge to SNF. Barrier to discharge: Delirium, and need for PT/OT eval.   Status is: Inpatient   Remains inpatient appropriate because:IV treatments appropriate due to intensity of illness or inability to take PO   Dispo: The patient is from: Home              Anticipated d/c is to: SNF              Anticipated d/c date is: tbd              Patient currently is not medically stable to d/c due to acute delirium.  Marilyn Hendricksen, DO [facility] Direct contact: see [email or web address]  7PM-7AM contact night coverage as above 03/25/2020, 1:20 PM  LOS: 1 day

## 2020-03-26 MED ORDER — HYDRALAZINE HCL 20 MG/ML IJ SOLN
INTRAMUSCULAR | Status: AC
  Filled 2020-03-26: qty 1

## 2020-03-26 MED ORDER — HYDRALAZINE HCL 25 MG PO TABS
25.0000 mg | ORAL_TABLET | Freq: Four times a day (QID) | ORAL | Status: DC
  Administered 2020-03-26 – 2020-03-27 (×5): 25 mg via ORAL
  Filled 2020-03-26 (×5): qty 1

## 2020-03-26 MED ORDER — HYDRALAZINE HCL 20 MG/ML IJ SOLN
10.0000 mg | Freq: Once | INTRAMUSCULAR | Status: DC
  Filled 2020-03-26: qty 1

## 2020-03-26 NOTE — Progress Notes (Signed)
Patient receiving Tele Sitter initiated at start of night shift. Patient remains confused with some altered mental status. Able to assist her to bedside commode. Monitoring BP as it was elevated this morning on my previous shift to 200/90. Patient is calm and cooperative right now. Will continue to monitor her and make frequent contacts to assess her safety. Very pleasant disposition.

## 2020-03-26 NOTE — Progress Notes (Signed)
PROGRESS NOTE  MALEYA Oconnell QHU:[id number] DOB: 04/12/1924 DOA: 03/22/2020 PCP: Trinna Post, PA-C  Brief History    The patient is a 86 yr old woman who carries a medical history significant for blindness, Marilyn Oconnell, arthritis, CHF, COPD, Coronary artery disease, Hyperlipidemia, hypertension. EMS was called when the patient told her niece that she had had palpitations for more than 2 hours. EMS found her to have heart rate of 160.   [facility] were consulted to admit the patient for further evaluation and treatment. She was admitted to a telemetry bed on a diltiazem drip. Overnight she converted to sinus rhythm. She will be weaned off of diltiazem today. She has been continued on metoprolol as at home.   Overnight on the night of 7/1-7/2 the patient had been extremely confused, agitated and combative. She has pretty much slept since then. Last night was quiet No further episodes of confusions. No antipsychotics required.  Consultants  . None  Antibiotics   Anti-infectives (From admission, onward)   None     Subjective  The patient is awake, cooperative with exam, and following exam.  Objective   Vitals:  Vitals:   03/26/20 0727 03/26/20 1117  BP: (!) 176/68 (!) 160/66  Pulse: 69 66  Resp: 19 14  Temp: 97.9 F (36.6 C) 98.4 F (36.9 C)  SpO2: 100% 100%   Exam:  Constitutional:  The patient is awake and alert. She is cooperative and following exam. No acute distress. Respiratory:  . No increased work of breathing. . No wheezes, rales, or rhonchi . No tactile fremitus Cardiovascular:  . Regular rate and rhythm . No murmurs, ectopy, or gallups. . No lateral PMI. No thrills. Abdomen:  . Abdomen is soft, non-tender, non-distended . No hernias, masses, or organomegaly . Normoactive bowel sounds.  Musculoskeletal:  . No cyanosis, clubbing, or edema Skin:  . No rashes, lesions, ulcers . palpation of skin: no induration or  nodules Neurologic:  . CNII- XII grossly intact. . Motor and sensory intact . Speech intact Psychiatric:  . Awake, alert, and oriented x 32.  I have personally reviewed the following:   Today's Data  . [place]  Cardiology Data  . EKG . Echocardiogram: Severe AS . MRI: No acute sor subacute ischemia identified.  o Moderately advanced for age chronic small vessel disease including at the right basal ganglia as demonstrated on CT head. o Positive for a small 9-10 mass of the right internal auditory canal consistent with a vestibular Schwannoma.  Scheduled Meds: . allopurinol  100 mg Oral QPM  . aspirin EC  81 mg Oral Daily  . escitalopram  20 mg Oral Daily  . heparin injection (subcutaneous)  5,000 Units Subcutaneous Q12H  . hydrALAZINE  10 mg Intravenous Once  . ipratropium-albuterol  3 mL Nebulization TID  . mouth rinse  15 mL Mouth Rinse BID  . metoprolol succinate  25 mg Oral Once  . metoprolol succinate  75 mg Oral Daily  . mometasone-formoterol  2 puff Inhalation BID  . simvastatin  20 mg Oral QHS  . sodium chloride flush  3 mL Intravenous Q12H   Continuous Infusions: . diltiazem (CARDIZEM) infusion 5 mg/hr (03/22/20 1631)  . diltiazem (CARDIZEM) infusion Stopped (03/23/20 1118)    Active Problems:   BP (high blood pressure)   Chronic kidney disease (CKD), stage III (moderate)   Fibrosis of lung (HCC)   Asthma-chronic obstructive pulmonary disease overlap Oconnell (HCC)   Depression   Chronic pain  O[place]   Atrial fibrillation with RVR [facility])   Advanced age   Marilyn Oconnell   Blindness   LOS: 4 days   A & P  Atrial fibrillation with RVR: Rate is controlled on oral metoprolol now. She has converted to sinus rhythm. The patient is not on anticoagulation at home. Pt has been placed on a cardizem drip. She is on metoprolol at home. I have discussed anticoagulation with the patient's niece. It seems that due to the patient's blindness and debility she does  present a fall risk. At this point anticoagulation is not a good idea. I have encouraged the niece to discuss it with the patient's PCP in the future.  Severe aortic stenosis: To be followed up by cardiology as outpatient.  Deliriuim: Appears to be resolving. Pt will be ambulated. Delirium precautions, minimize delirium inducing medications and mobilization. She has required no further antipsychotics.  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  Elevated Troponin: Elevated, but stable. Likely due to demand ischemia. Echocardiogram demonstrates no wall motion abnormalities, but does show elevated PASP and severe aortic stenosis.  Diastolic heart Failure: Echocardiogram demonstrated Grade I diastolic dysfunction. Monitor volume status.   Hypertension: Blood pressure improved on metoprolol.  Chronic pain: Continue home meds   Marilyn Oconnell: Noted.   Idiopathic Pulmonary Fibrosis: Noted.  Debility: PT/OT to eval and treat. The family is interested in the patient being discharged to SNF.  CKD IVa   I have seen and examined this patient myself. I have spent 32 minutes in her evaluation and care.   DVT Prophylaxis: Lovenox CODE STATUS: Full Code Family Communication: Niece is at bedside Disposition: Patient is from home. Anticipate discharge to SNF. Barrier to discharge: SNF bed availability and PT/OT eval.   Status is: Inpatient   Remains inpatient appropriate because:IV treatments appropriate due to intensity of illness or inability to take PO   Dispo: The patient is from: Home              Anticipated d/c is to: SNF              Anticipated d/c date is: 03/27/2020              Patient currently is not medically stable to d/c due to lack of safe discharge. She iw medical cleared for discharge.  Arlie Posch, DO [facility] Direct contact: see [email or web address]  7PM-7AM contact night coverage as above 03/26/2020, 12:09 PM  LOS: 1 day

## 2020-03-26 NOTE — Progress Notes (Signed)
Notified NP Sharion Settler about patient being hypertensive. NP Sharion Settler Ordered 10mg  Hydralazine. Patient's BP was rechecked prior to admin and it SBP was 166. I did not give Hydralazine and will pass info along to day shift. Patient is asymptomatic sleeping in bed with sitter at bedside.

## 2020-03-27 DIAGNOSIS — I509 Heart failure, unspecified: Secondary | ICD-10-CM | POA: Diagnosis not present

## 2020-03-27 DIAGNOSIS — I480 Paroxysmal atrial fibrillation: Secondary | ICD-10-CM | POA: Diagnosis not present

## 2020-03-27 DIAGNOSIS — G8929 Other chronic pain: Secondary | ICD-10-CM | POA: Diagnosis present

## 2020-03-27 DIAGNOSIS — I130 Hypertensive heart and chronic kidney disease with heart failure and stage 1 through stage 4 chronic kidney disease, or unspecified chronic kidney disease: Secondary | ICD-10-CM | POA: Diagnosis not present

## 2020-03-27 DIAGNOSIS — J439 Emphysema, unspecified: Secondary | ICD-10-CM | POA: Diagnosis not present

## 2020-03-27 DIAGNOSIS — Z7401 Bed confinement status: Secondary | ICD-10-CM | POA: Diagnosis not present

## 2020-03-27 DIAGNOSIS — M2550 Pain in unspecified joint: Secondary | ICD-10-CM | POA: Diagnosis not present

## 2020-03-27 DIAGNOSIS — F329 Major depressive disorder, single episode, unspecified: Secondary | ICD-10-CM | POA: Diagnosis not present

## 2020-03-27 DIAGNOSIS — M5481 Occipital neuralgia: Secondary | ICD-10-CM | POA: Diagnosis present

## 2020-03-27 DIAGNOSIS — G4700 Insomnia, unspecified: Secondary | ICD-10-CM | POA: Diagnosis not present

## 2020-03-27 DIAGNOSIS — M6281 Muscle weakness (generalized): Secondary | ICD-10-CM | POA: Diagnosis not present

## 2020-03-27 DIAGNOSIS — R441 Visual hallucinations: Secondary | ICD-10-CM | POA: Diagnosis not present

## 2020-03-27 DIAGNOSIS — H543 Unqualified visual loss, both eyes: Secondary | ICD-10-CM | POA: Diagnosis not present

## 2020-03-27 DIAGNOSIS — F05 Delirium due to known physiological condition: Secondary | ICD-10-CM | POA: Diagnosis not present

## 2020-03-27 DIAGNOSIS — R2689 Other abnormalities of gait and mobility: Secondary | ICD-10-CM | POA: Diagnosis not present

## 2020-03-27 DIAGNOSIS — I4891 Unspecified atrial fibrillation: Secondary | ICD-10-CM | POA: Diagnosis not present

## 2020-03-27 DIAGNOSIS — R1312 Dysphagia, oropharyngeal phase: Secondary | ICD-10-CM | POA: Diagnosis not present

## 2020-03-27 DIAGNOSIS — I5032 Chronic diastolic (congestive) heart failure: Secondary | ICD-10-CM | POA: Diagnosis not present

## 2020-03-27 DIAGNOSIS — H5440 Blindness, one eye, unspecified eye: Secondary | ICD-10-CM | POA: Diagnosis not present

## 2020-03-27 DIAGNOSIS — M1990 Unspecified osteoarthritis, unspecified site: Secondary | ICD-10-CM | POA: Diagnosis not present

## 2020-03-27 DIAGNOSIS — R519 Headache, unspecified: Secondary | ICD-10-CM | POA: Diagnosis present

## 2020-03-27 DIAGNOSIS — N184 Chronic kidney disease, stage 4 (severe): Secondary | ICD-10-CM | POA: Diagnosis not present

## 2020-03-27 DIAGNOSIS — J449 Chronic obstructive pulmonary disease, unspecified: Secondary | ICD-10-CM | POA: Diagnosis not present

## 2020-03-27 DIAGNOSIS — R5381 Other malaise: Secondary | ICD-10-CM | POA: Diagnosis not present

## 2020-03-27 DIAGNOSIS — B351 Tinea unguium: Secondary | ICD-10-CM | POA: Diagnosis not present

## 2020-03-27 DIAGNOSIS — R4189 Other symptoms and signs involving cognitive functions and awareness: Secondary | ICD-10-CM | POA: Diagnosis not present

## 2020-03-27 DIAGNOSIS — G894 Chronic pain syndrome: Secondary | ICD-10-CM | POA: Diagnosis not present

## 2020-03-27 DIAGNOSIS — F3341 Major depressive disorder, recurrent, in partial remission: Secondary | ICD-10-CM | POA: Diagnosis not present

## 2020-03-27 DIAGNOSIS — E785 Hyperlipidemia, unspecified: Secondary | ICD-10-CM | POA: Diagnosis not present

## 2020-03-27 DIAGNOSIS — F29 Unspecified psychosis not due to a substance or known physiological condition: Secondary | ICD-10-CM | POA: Diagnosis not present

## 2020-03-27 DIAGNOSIS — F339 Major depressive disorder, recurrent, unspecified: Secondary | ICD-10-CM | POA: Diagnosis not present

## 2020-03-27 DIAGNOSIS — I499 Cardiac arrhythmia, unspecified: Secondary | ICD-10-CM | POA: Diagnosis not present

## 2020-03-27 DIAGNOSIS — I2510 Atherosclerotic heart disease of native coronary artery without angina pectoris: Secondary | ICD-10-CM | POA: Diagnosis not present

## 2020-03-27 DIAGNOSIS — R278 Other lack of coordination: Secondary | ICD-10-CM | POA: Diagnosis not present

## 2020-03-27 DIAGNOSIS — R54 Age-related physical debility: Secondary | ICD-10-CM | POA: Diagnosis not present

## 2020-03-27 DIAGNOSIS — E441 Mild protein-calorie malnutrition: Secondary | ICD-10-CM | POA: Diagnosis not present

## 2020-03-27 DIAGNOSIS — Z741 Need for assistance with personal care: Secondary | ICD-10-CM | POA: Diagnosis not present

## 2020-03-27 DIAGNOSIS — R0602 Shortness of breath: Secondary | ICD-10-CM | POA: Diagnosis not present

## 2020-03-27 LAB — BASIC METABOLIC PANEL
Anion gap: 8 (ref 5–15)
BUN: 38 mg/dL — ABNORMAL HIGH (ref 8–23)
CO2: 29 mmol/L (ref 22–32)
Calcium: 9.4 mg/dL (ref 8.9–10.3)
Chloride: 99 mmol/L (ref 98–111)
Creatinine, Ser: 1.47 mg/dL — ABNORMAL HIGH (ref 0.44–1.00)
GFR calc Af Amer: 35 mL/min — ABNORMAL LOW (ref >60)
GFR calc non Af Amer: 30 mL/min — ABNORMAL LOW (ref >60)
Glucose, Bld: 102 mg/dL — ABNORMAL HIGH (ref 70–99)
Potassium: 3.9 mmol/L (ref 3.5–5.1)
Sodium: 136 mmol/L (ref 135–145)

## 2020-03-27 MED ORDER — MELATONIN 5 MG PO TABS
5.0000 mg | ORAL_TABLET | Freq: Every day | ORAL | Status: DC
  Administered 2020-03-27: 5 mg via ORAL
  Filled 2020-03-27: qty 1

## 2020-03-27 MED ORDER — METOPROLOL SUCCINATE ER 50 MG PO TB24: 75.0000 mg | ORAL_TABLET | Freq: Every day | ORAL | 0 refills | Status: DC

## 2020-03-27 MED ORDER — FLUTICASONE-SALMETEROL 250-50 MCG/DOSE IN AEPB: INHALATION_SPRAY | RESPIRATORY_TRACT | 0 refills | Status: DC

## 2020-03-27 MED ORDER — HYDRALAZINE HCL 25 MG PO TABS: 25.0000 mg | ORAL_TABLET | Freq: Four times a day (QID) | ORAL | 0 refills | Status: DC

## 2020-03-27 MED ORDER — IPRATROPIUM-ALBUTEROL 0.5-2.5 (3) MG/3ML IN SOLN: 3.0000 mL | Freq: Three times a day (TID) | RESPIRATORY_TRACT | 0 refills | Status: AC

## 2020-03-27 NOTE — Progress Notes (Signed)
Patient increasingly anxious and fearful. Trying to get out of bed stating she has to "get out of here." Spoke with NP Sharion Settler about the patient's current situation. NP ordered 5mg  Melatonin for patient. Administered at 0200 and will continue to assess her frequently to assure she is resting and calm. Easily redirected, frequently.

## 2020-03-27 NOTE — Discharge Summary (Signed)
Physician Discharge Summary  Marilyn Oconnell CBJ:[id number] DOB: 1924-03-10 DOA: 03/22/2020  PCP: Trinna Post, PA-C  Admit date: 03/22/2020 Discharge date: 03/27/2020  Recommendations for Outpatient Follow-up:  1. Discharge to SNF for rehab, PT/OT 2. 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 She has visual halllucinations at times, but these usually "go away" once the lights are turned on. This is [name] 3. Check chemistry on 03/30/2020 and report results to PCP. 4. Follow up with PCP in 7-10 days after discharge from facility.  Discharge Diagnoses: Principal diagnosis is #1 1. Atrial fibrillation with RVR 2. Delirium 3. Blindness 4. Hard of hearing 5. [name]  Discharge Condition: Fair  Disposition: SNF  Diet recommendation: Heart healthy  Filed Weights   03/22/20 1434 03/23/20 0407 03/24/20 0435  Weight: 57.6 kg 50.8 kg 50.2 kg    History of present illness:  The patient is a 85 yr old woman who carries a medical history significant for blindness, [name], arthritis, CHF, COPD, Coronary artery disease, Hyperlipidemia, hypertension. EMS was called when the patient told her niece that she had had palpitations for more than 2 hours. EMS found her to have heart rate of 160.   Hospital Course:  [facility] were consulted to admit the patient for further evaluation and treatment. She was admitted to a telemetry bed on a diltiazem drip. Overnight she converted to sinus rhythm. She will be weaned off of diltiazem today. She has been continued on metoprolol as at home.   Overnight on the night of 7/1-7/2 the patient had been extremely confused, agitated and combative. She has pretty much slept since then. The last two nights were quiet and no further episodes of confusions were reported. No antipsychotics required.  Other than a little anxiety this morning, the patient is doing very well this  morning.  Today's assessment: S: The patient is awake and alert. She denies any visual hallucinations this morning. No new complaints. O: Vitals:  Vitals:   03/27/20 0729 03/27/20 0747  BP: (!) 166/63   Pulse: 75   Resp: 16   Temp: 98.4 F (36.9 C)   SpO2: 100% 100%   Exam:  Constitutional:  The patient is awake and alert. She is cooperative and following exam. No acute distress. Respiratory:   No increased work of breathing.  No wheezes, rales, or rhonchi  No tactile fremitus Cardiovascular:   Regular rate and rhythm  No murmurs, ectopy, or gallups.  No lateral PMI. No thrills. Abdomen:   Abdomen is soft, non-tender, non-distended  No hernias, masses, or organomegaly  Normoactive bowel sounds.  Musculoskeletal:   No cyanosis, clubbing, or edema Skin:   No rashes, lesions, ulcers  palpation of skin: no induration or nodules Neurologic:   CNII- XII grossly intact.  Motor and sensory intact  Speech intact Psychiatric:   Awake, alert, and oriented x 32.   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 She has visual halllucinations at times, but these usually "go away" once the lights are turned on. This is [name] Check chemistry on 03/30/2020 and report results to  PCP. Follow up with PCP in 7-10 days after discharge from facility.   Increase activity slowly   Complete by: As directed      Allergies as of 03/27/2020      Reactions   Iodine Anaphylaxis   Shellfish Allergy Anaphylaxis   Azithromycin Hives   Sulfa Antibiotics Hives      Medication List    STOP taking these medications   meloxicam 15 MG tablet Commonly known as: MOBIC      TAKE these medications   acetaminophen 500 MG tablet Commonly known as: TYLENOL Take 500-1,000 mg by mouth 2 (two) times daily as needed for mild pain, moderate pain, fever or headache.   albuterol (2.5 MG/3ML) 0.083% nebulizer solution Commonly known as: PROVENTIL Take 2.5 mg by nebulization every 8 (eight) hours as needed for wheezing or shortness of breath.   allopurinol 100 MG tablet Commonly known as: ZYLOPRIM Take 100 mg by mouth every evening.   aspirin EC 81 MG tablet Take 81 mg by mouth daily.   budesonide 0.5 MG/2ML nebulizer solution Commonly known as: PULMICORT Take 0.5 mg by nebulization at bedtime.   escitalopram 20 MG tablet Commonly known as: LEXAPRO TAKE 1 TABLET BY MOUTH  DAILY   Fluticasone-Salmeterol 250-50 MCG/DOSE Aepb Commonly known as: Advair Diskus USE ONE (1) INHALATION BY MOUTH EVERY 12 HOURS. RINSE MOUTH OUT AFTERUSE.   furosemide 20 MG tablet Commonly known as: LASIX TAKE 1 TABLET BY MOUTH  DAILY   hydrALAZINE 25 MG tablet Commonly known as: APRESOLINE Take 1 tablet (25 mg total) by mouth every 6 (six) hours.   ipratropium-albuterol 0.5-2.5 (3) MG/3ML Soln Commonly known as: DUONEB Take 3 mLs by nebulization 3 (three) times daily.   metoprolol succinate 50 MG 24 hr tablet Commonly known as: TOPROL-XL Take 1.5 tablets (75 mg total) by mouth daily. Take with or immediately following a meal. Start taking on: March 28, 2020 What changed: how much to take   simvastatin 20 MG tablet Commonly known as: ZOCOR TAKE 1 TABLET BY MOUTH AT  BEDTIME   traZODone 50 MG tablet Commonly known as: DESYREL TAKE ONE-HALF TABLET BY  MOUTH AT BEDTIME      Allergies  Allergen Reactions  . Iodine Anaphylaxis  . Shellfish Allergy Anaphylaxis  . Azithromycin Hives  . Sulfa Antibiotics Hives    The results of significant diagnostics from this hospitalization (including imaging, microbiology, ancillary and laboratory) are listed below for reference.     Significant Diagnostic Studies: CT HEAD WO CONTRAST  Result Date: 03/24/2020 CLINICAL DATA:  Altered mental status, unclear cause. EXAM: CT HEAD WITHOUT CONTRAST TECHNIQUE: Contiguous axial images were obtained from the base of the skull through the vertex without intravenous contrast. COMPARISON:  Prior head CT examinations 07/19/2019 and earlier. FINDINGS: Brain: Stable, moderate generalized parenchymal atrophy. Stable, advanced ill-defined hypoattenuation within the cerebral white matter which is nonspecific, but consistent with chronic small vessel ischemic disease. There is a small lacunar infarct within the right basal ganglia/internal capsule which was not present on prior head CT 07/19/2019 but is otherwise age indeterminate. Redemonstrated mineralization within the bilateral basal ganglia. There is no acute intracranial hemorrhage. No demarcated cortical infarct is identified. No extra-axial fluid collection. No evidence of intracranial mass. No midline shift. Vascular: No hyperdense vessel.  Atherosclerotic calcifications. Skull: Normal. Negative for fracture or focal lesion. Sinuses/Orbits: Visualized orbits show no acute finding. Minimal ethmoid sinus mucosal thickening at the imaged levels. No significant mastoid effusion at the imaged levels. IMPRESSION: A  small lacunar infarct within the right basal ganglia/internal capsule was not present on prior head CT 07/19/2019, but is otherwise age indeterminate. Stable background moderate generalized parenchymal atrophy and advanced chronic small vessel ischemic disease. Minimal ethmoid sinus mucosal thickening. Electronically Signed   By: Kellie Simmering DO   On: 03/24/2020 20:07   MR BRAIN WO CONTRAST  Result Date: 03/25/2020 CLINICAL DATA:  86 year old female with unexplained altered mental status. Age indeterminate right basal ganglia lacunar infarct on head CT yesterday. EXAM: MRI HEAD WITHOUT CONTRAST TECHNIQUE: Multiplanar, multiecho pulse  sequences of the brain and surrounding structures were obtained without intravenous contrast. COMPARISON:  Head CT 03/24/2020 and earlier. FINDINGS: Brain: No restricted diffusion or evidence of acute infarction. Mineralization and chronic T2/FLAIR heterogeneity in the bilateral basal ganglia and thalami, including the small area on the right by CT yesterday. Superimposed confluent white matter T2 and FLAIR hyperintensity. Mild to moderate similar T2 signal heterogeneity in the pons. No cortical encephalomalacia or definite chronic cerebral blood products. No midline shift, mass effect, ventriculomegaly, extra-axial collection or acute intracranial hemorrhage. Cervicomedullary junction and pituitary are within normal limits. Vascular: Major intracranial vascular flow voids are preserved; slow flow is suspected in the right sigmoid sinus and IJ bulb. Skull and upper cervical spine: Negative for age visible cervical spine, bone marrow signal. Sinuses/Orbits: Postoperative changes to both globes, otherwise negative orbits. Paranasal sinuses and mastoids are stable and well pneumatized. Other: There is a 9 mm nodular soft tissue mass at the right cerebellopontine angle associated with loss of the normal right internal auditory canal appearance (series 10, image 8 and series 16, image 39. The contralateral left internal auditory structures appear grossly normal. Scalp and face soft tissues appear negative. IMPRESSION: 1. No acute or subacute ischemia identified. Moderately advanced for age chronic small vessel disease, including at the right basal ganglia seen by CT yesterday. 2. 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 Query associated sensorineural hearing loss or tinnitus. Electronically Signed   By: Genevie Ann M.D.   On: 03/25/2020 07:59   DG Chest Portable 1 View  Result Date: 03/22/2020 CLINICAL DATA:  Shortness of breath. EXAM: PORTABLE  CHEST 1 VIEW COMPARISON:  July 19, 2019. FINDINGS: Stable cardiomegaly. No pneumothorax or pleural effusion is noted. Status post aortic valve repair. Minimal bibasilar subsegmental atelectasis is noted. Bony thorax is unremarkable. IMPRESSION: Minimal bibasilar subsegmental atelectasis. Electronically Signed   By: Marijo Conception M.D.   On: 03/22/2020 16:35   ECHOCARDIOGRAM COMPLETE  Result Date: 03/24/2020    ECHOCARDIOGRAM REPORT   Patient Name:   Marilyn Oconnell Date of Exam: 03/23/2020 Medical Rec #:  [id number]      Height:       60.0 in Accession #:    [id number]     Weight:       111.9 lb Date of Birth:  [birth year]      BSA:          1.458 m Patient Age:    20 years       BP:           132/92 mmHg Patient Gender: F              HR:           67 bpm. Exam Location:  [facility] Procedure: 2D Echo, Color Doppler and Cardiac Doppler Indications:     D32.20 CHF-Acute Systolic  History:  Patient has prior history of Echocardiogram examinations, most                  recent 07/20/2019. CAD, COPD; Risk Factors:Hypertension and                  HCL.  Sonographer:     Charmayne Sheer RDCS (AE) Referring Phys:  [id number] [address] Farnam Benny Lennert Diagnosing Phys: Yolonda Kida MD IMPRESSIONS  1. Left ventricular ejection fraction, by estimation, is 65 to 70%. The left ventricle has normal function. The left ventricle has no regional wall motion abnormalities. Left ventricular diastolic parameters are consistent with Grade I diastolic dysfunction (impaired relaxation).  2. Right ventricular systolic function is normal. The right ventricular size is normal. There is moderately elevated pulmonary artery systolic pressure.  3. The mitral valve is grossly normal. Mild mitral valve regurgitation.  4. The aortic valve is tricuspid. Aortic valve regurgitation is trivial. Severe aortic valve stenosis. FINDINGS  Left Ventricle: Left ventricular ejection fraction, by estimation, is 65 to 70%. The left ventricle has normal function. The left  ventricle has no regional wall motion abnormalities. The left ventricular internal cavity size was normal in size. There is  borderline left ventricular hypertrophy. Left ventricular diastolic parameters are consistent with Grade I diastolic dysfunction (impaired relaxation). Right Ventricle: The right ventricular size is normal. No increase in right ventricular wall thickness. Right ventricular systolic function is normal. There is moderately elevated pulmonary artery systolic pressure. The tricuspid regurgitant velocity is 2.97 m/s, and with an assumed right atrial pressure of 10 mmHg, the estimated right ventricular systolic pressure is 42.3 mmHg. Left Atrium: Left atrial size was normal in size. Right Atrium: Right atrial size was normal in size. Pericardium: There is no evidence of pericardial effusion. Mitral Valve: The mitral valve is grossly normal. Mild mitral valve regurgitation. MV peak gradient, 8.6 mmHg. The mean mitral valve gradient is 3.0 mmHg. Tricuspid Valve: The tricuspid valve is grossly normal. Tricuspid valve regurgitation is mild. Aortic Valve: The aortic valve is tricuspid. Aortic valve regurgitation is trivial. Severe aortic stenosis is present. Aortic valve mean gradient measures 44.0 mmHg. Aortic valve peak gradient measures 74.6 mmHg. Aortic valve area, by VTI measures 0.40 cm. Pulmonic Valve: The pulmonic valve was normal in structure. Pulmonic valve regurgitation is trivial. Aorta: The aortic root is normal in size and structure. IAS/Shunts: No atrial level shunt detected by color flow Doppler.  LEFT VENTRICLE PLAX 2D LVIDd:         2.96 cm  Diastology LVIDs:         1.79 cm  LV e' lateral:   5.87 cm/s LV PW:         1.14 cm  LV E/e' lateral: 19.4 LV IVS:        1.22 cm  LV e' medial:    4.13 cm/s LVOT diam:     1.60 cm  LV E/e' medial:  27.6 LV SV:         36 LV SV Index:   25 LVOT Area:     2.01 cm  RIGHT VENTRICLE RV Basal diam:  3.40 cm LEFT ATRIUM             Index       RIGHT  ATRIUM           Index LA diam:        4.20 cm 2.88 cm/m  RA Area:     14.50 cm LA Vol (A2C):   85.8  ml 58.83 ml/m RA Volume:   36.10 ml  24.75 ml/m LA Vol (A4C):   73.8 ml 50.60 ml/m LA Biplane Vol: 82.4 ml 56.50 ml/m  AORTIC VALVE                    PULMONIC VALVE AV Area (Vmax):    0.47 cm     PV Vmax:       1.09 m/s AV Area (Vmean):   0.43 cm     PV Vmean:      77.200 cm/s AV Area (VTI):     0.40 cm     PV VTI:        0.222 m AV Vmax:           432.00 cm/s  PV Peak grad:  4.8 mmHg AV Vmean:          313.000 cm/s PV Mean grad:  3.0 mmHg AV VTI:            0.902 m AV Peak Grad:      74.6 mmHg AV Mean Grad:      44.0 mmHg LVOT Vmax:         100.00 cm/s LVOT Vmean:        66.800 cm/s LVOT VTI:          0.179 m LVOT/AV VTI ratio: 0.20  AORTA Ao Root diam: 3.00 cm MITRAL VALVE                TRICUSPID VALVE MV Area (PHT): 2.29 cm     TR Peak grad:   35.3 mmHg MV Peak grad:  8.6 mmHg     TR Vmax:        297.00 cm/s MV Mean grad:  3.0 mmHg MV Vmax:       1.47 m/s     SHUNTS MV Vmean:      87.7 cm/s    Systemic VTI:  0.18 m MV Decel Time: 331 msec     Systemic Diam: 1.60 cm MV E velocity: 114.00 cm/s MV A velocity: 124.00 cm/s MV E/A ratio:  0.92 Dwayne D Callwood MD Electronically signed by Yolonda Kida MD Signature Date/Time: 03/24/2020/2:15:35 PM    Final     Microbiology: Recent Results (from the past 240 hour(s))  SARS Coronavirus 2 by RT PCR (hospital order, performed in [facility] hospital lab) Nasopharyngeal Nasopharyngeal Swab     Status: None   Collection Time: 03/22/20  4:43 PM   Specimen: Nasopharyngeal Swab  Result Value Ref Range Status   SARS Coronavirus 2 NEGATIVE NEGATIVE Final    Comment: (NOTE) SARS-CoV-2 target nucleic acids are NOT DETECTED.  The SARS-CoV-2 RNA is generally detectable in upper and lower respiratory specimens during the acute phase of infection. The lowest concentration of SARS-CoV-2 viral copies this assay can detect is 250 copies / mL. A negative result  does not preclude SARS-CoV-2 infection and should not be used as the sole basis for treatment or other patient management decisions.  A negative result may occur with improper specimen collection / handling, submission of specimen other than nasopharyngeal swab, presence of viral mutation(s) within the areas targeted by this assay, and inadequate number of viral copies (<250 copies / mL). A negative result must be combined with clinical observations, patient history, and epidemiological information.  Fact Sheet for Patients:   StrictlyIdeas.no  Fact Sheet for Healthcare Providers: BankingDealers.co.za  This test is not yet approved or  cleared by the [place] FDA  and has been authorized for detection and/or diagnosis of SARS-CoV-2 by FDA under an Emergency Use Authorization (EUA).  This EUA will remain in effect (meaning this test can be used) for the duration of the COVID-19 declaration under Section 564(b)(1) of the Act, 21 U.S.C. section 360bbb-3(b)(1), unless the authorization is terminated or revoked sooner.  Performed at [facility], [address] [place], [place] [address]   Urine Culture     Status: Abnormal   Collection Time: 03/23/20  3:40 PM   Specimen: Urine, Random  Result Value Ref Range Status   Specimen Description   Final    URINE, RANDOM Performed at [facility], [address] [place], [place] [address]    Special Requests   Final    NONE Performed at [facility], [address] [place], [place] [address]    Culture (A)  Final    <10,000 COLONIES/mL INSIGNIFICANT GROWTH Performed at [facility], [place] [address], [place], [place] [address]    Report Status 03/25/2020 FINAL  Final     Labs: Basic Metabolic Panel: Recent Labs  Lab 03/22/20 1436 03/23/20 0431 03/24/20 0517 03/27/20 0554  NA 136 138 136 136  K 4.0 4.0 4.0 3.9  CL 100 99 97* 99  CO2 28 28  27 29   GLUCOSE 105* 110* 131* 102*  BUN 29* 32* 37* 38*  CREATININE 1.52* 1.81* 1.68* 1.47*  CALCIUM 9.3 9.2 9.4 9.4  MG 1.9  --   --   --    Liver Function Tests: Recent Labs  Lab 03/22/20 1436 03/23/20 0431  AST 17 15  ALT 9 9  ALKPHOS 67 58  BILITOT 0.6 0.6  PROT 6.9 6.6  ALBUMIN 3.6 3.4*   No results for input(s): LIPASE, AMYLASE in the last 168 hours. No results for input(s): AMMONIA in the last 168 hours. CBC: Recent Labs  Lab 03/22/20 1436 03/23/20 0431 03/24/20 0517  WBC 7.8 7.1 11.0*  NEUTROABS 5.7  --   --   HGB 10.3* 10.1* 11.0*  HCT 31.7* 30.5* 31.7*  MCV 94.6 93.6 90.3  PLT 206 203 208   Cardiac Enzymes: No results for input(s): CKTOTAL, CKMB, CKMBINDEX, TROPONINI in the last 168 hours. BNP: BNP (last 3 results) Recent Labs    03/22/20 1436  BNP 496.8*    ProBNP (last 3 results) No results for input(s): PROBNP in the last 8760 hours.  CBG: No results for input(s): GLUCAP in the last 168 hours.  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  Signed:        Indria Bishara, DO [facility]  03/27/2020, 10:55 AM

## 2020-03-27 NOTE — Progress Notes (Signed)
Called [facility]-  Gave report to [employer] .   Also discussed discharge instruction with Sharyn Lull (family).   Sent paperwork with EMS to facility.

## 2020-03-27 NOTE — Care Management Important Message (Signed)
Important Message  Patient Details  Name: Marilyn Oconnell MRN: [id number] Date of Birth: 1924-09-08   Medicare Important Message Given:  Yes     Dannette Barbara 03/27/2020, 11:17 AM

## 2020-03-28 DIAGNOSIS — I480 Paroxysmal atrial fibrillation: Secondary | ICD-10-CM | POA: Diagnosis not present

## 2020-03-28 DIAGNOSIS — J439 Emphysema, unspecified: Secondary | ICD-10-CM | POA: Diagnosis not present

## 2020-03-28 DIAGNOSIS — I5032 Chronic diastolic (congestive) heart failure: Secondary | ICD-10-CM | POA: Diagnosis not present

## 2020-03-28 DIAGNOSIS — F3341 Major depressive disorder, recurrent, in partial remission: Secondary | ICD-10-CM | POA: Diagnosis not present

## 2020-03-28 DIAGNOSIS — E441 Mild protein-calorie malnutrition: Secondary | ICD-10-CM | POA: Diagnosis not present

## 2020-03-28 DIAGNOSIS — F05 Delirium due to known physiological condition: Secondary | ICD-10-CM | POA: Diagnosis not present

## 2020-03-28 DIAGNOSIS — H5440 Blindness, one eye, unspecified eye: Secondary | ICD-10-CM

## 2020-04-05 ENCOUNTER — Other Ambulatory Visit: Payer: Self-pay

## 2020-04-05 ENCOUNTER — Telehealth: Payer: Self-pay | Admitting: *Deleted

## 2020-04-05 ENCOUNTER — Encounter: Payer: Self-pay | Admitting: Student in an Organized Health Care Education/Training Program

## 2020-04-05 ENCOUNTER — Ambulatory Visit
Payer: Medicare Other | Attending: Student in an Organized Health Care Education/Training Program | Admitting: Student in an Organized Health Care Education/Training Program

## 2020-04-05 VITALS — BP 170/68 | HR 74 | Temp 97.2°F | Resp 20 | Ht 61.0 in | Wt 110.0 lb

## 2020-04-05 DIAGNOSIS — M5481 Occipital neuralgia: Secondary | ICD-10-CM | POA: Insufficient documentation

## 2020-04-05 DIAGNOSIS — G8929 Other chronic pain: Secondary | ICD-10-CM | POA: Insufficient documentation

## 2020-04-05 DIAGNOSIS — R519 Headache, unspecified: Secondary | ICD-10-CM

## 2020-04-05 MED ORDER — LIDOCAINE HCL 2 % IJ SOLN
INTRAMUSCULAR | Status: AC
  Filled 2020-04-05: qty 20

## 2020-04-05 MED ORDER — DEXAMETHASONE SODIUM PHOSPHATE 10 MG/ML IJ SOLN
INTRAMUSCULAR | Status: AC
  Filled 2020-04-05: qty 1

## 2020-04-05 MED ORDER — METHYLPREDNISOLONE ACETATE 40 MG/ML IJ SUSP
40.0000 mg | Freq: Once | INTRAMUSCULAR | Status: AC
  Administered 2020-04-05: 40 mg via INTRA_ARTICULAR

## 2020-04-05 MED ORDER — LIDOCAINE HCL 2 % IJ SOLN
20.0000 mL | Freq: Once | INTRAMUSCULAR | Status: AC
  Administered 2020-04-05: 400 mg

## 2020-04-05 MED ORDER — METHYLPREDNISOLONE ACETATE 80 MG/ML IJ SUSP
INTRAMUSCULAR | Status: AC
  Filled 2020-04-05: qty 1

## 2020-04-05 MED ORDER — ROPIVACAINE HCL 2 MG/ML IJ SOLN
INTRAMUSCULAR | Status: AC
  Filled 2020-04-05: qty 10

## 2020-04-05 NOTE — Progress Notes (Signed)
PROVIDER NOTE: Information contained herein reflects review and annotations entered in association with encounter. Interpretation of such information and data should be left to medically-trained personnel. Information provided to patient can be located elsewhere in the medical record under "Patient Instructions". Document created using STT-dictation technology, any transcriptional errors that may result from process are unintentional.    Patient: Marilyn Oconnell  Service Category: Procedure  Provider: Gillis Santa, MD  DOB: [birth year]  DOS: 04/05/2020  Location: [facility]  MRN: [id number]  Setting: Ambulatory - outpatient  Referring Provider: Trinna Post, PA-C  Type: Established Patient  Specialty: Interventional Pain Management  PCP: Trinna Post, PA-C   Primary Reason for Visit: Interventional Pain Management Treatment. CC: Headache (left head)  Procedure:          Anesthesia, Analgesia, Anxiolysis:  Type: Diagnostic, Greater, Occipital Nerve Block  #1  Region: Posterolateral Cervical Level: Occipital Ridge   Laterality: Left-Sided  Type: Local Anesthesia  Local Anesthetic: Lidocaine 1-2%  Position: Prone   Indications: 1. Occipital neuralgia of left side   2. Chronic left-sided headaches    Pain Score: Pre-procedure: 0-No pain/10 Post-procedure: 0-No pain/10   Pre-op Assessment:  Marilyn Oconnell is a 86 y.o. (year old), female patient, seen today for interventional treatment. She  has a past surgical history that includes Shoulder surgery (09/1999); Sigmoidoscopy (1992); Dilation and curettage of uterus (1988); and Aortic valve replacement (2006). Marilyn Oconnell has a current medication list which includes the following prescription(s): acetaminophen, albuterol, allopurinol, aspirin ec, budesonide, diclofenac sodium, escitalopram, fluticasone-salmeterol, furosemide, hydralazine, hydralazine, ipratropium-albuterol, metoprolol succinate, simvastatin, and trazodone. Her  primarily concern today is the Headache (left head)  Initial Vital Signs:  Pulse/HCG Rate: 72  Temp: (!) 97.2 F (36.2 C) Resp: 18 BP: (!) 141/51 SpO2: 97 %  BMI: Estimated body mass index is 20.78 kg/m as calculated from the following:   Height as of this encounter: 5\' 1"  (1.549 m).   Weight as of this encounter: 110 lb (49.9 kg).  Risk Assessment: Allergies: Reviewed. She is allergic to iodine, shellfish allergy, azithromycin, and sulfa antibiotics.  Allergy Precautions: None required Coagulopathies: Reviewed. None identified.  Blood-thinner therapy: None at this time Active Infection(s): Reviewed. None identified. Marilyn Oconnell is afebrile  Site Confirmation: Marilyn Oconnell was asked to confirm the procedure and laterality before marking the site Procedure checklist: Completed Consent: Before the procedure and under the influence of no sedative(s), amnesic(s), or anxiolytics, the patient was informed of the treatment options, risks and possible complications. To fulfill our ethical and legal obligations, as recommended by the American Medical Association's Code of Ethics, I have informed the patient of my clinical impression; the nature and purpose of the treatment or procedure; the risks, benefits, and possible complications of the intervention; the alternatives, including doing nothing; the risk(s) and benefit(s) of the alternative treatment(s) or procedure(s); and the risk(s) and benefit(s) of doing nothing. The patient was provided information about the general risks and possible complications associated with the procedure. These may include, but are not limited to: failure to achieve desired goals, infection, bleeding, organ or nerve damage, allergic reactions, paralysis, and death. In addition, the patient was informed of those risks and complications associated to the procedure, such as failure to decrease pain; infection; bleeding; organ or nerve damage with subsequent damage to  sensory, motor, and/or autonomic systems, resulting in permanent pain, numbness, and/or weakness of one or several areas of the body; allergic reactions; (i.e.: anaphylactic reaction); and/or death. Furthermore, the  patient was informed of those risks and complications associated with the medications. These include, but are not limited to: allergic reactions (i.e.: anaphylactic or anaphylactoid reaction(s)); adrenal axis suppression; blood sugar elevation that in diabetics may result in ketoacidosis or comma; water retention that in patients with history of congestive heart failure may result in shortness of breath, pulmonary edema, and decompensation with resultant heart failure; weight gain; swelling or edema; medication-induced neural toxicity; particulate matter embolism and blood vessel occlusion with resultant organ, and/or nervous system infarction; and/or aseptic necrosis of one or more joints. Finally, the patient was informed that Medicine is not an exact science; therefore, there is also the possibility of unforeseen or unpredictable risks and/or possible complications that may result in a catastrophic outcome. The patient indicated having understood very clearly. We have given the patient no guarantees and we have made no promises. Enough time was given to the patient to ask questions, all of which were answered to the patient's satisfaction. Marilyn Oconnell has indicated that she wanted to continue with the procedure. Attestation: I, the ordering provider, attest that I have discussed with the patient the benefits, risks, side-effects, alternatives, likelihood of achieving goals, and potential problems during recovery for the procedure that I have provided informed consent. Date  Time: 04/05/2020 11:05 AM  Pre-Procedure Preparation:  Monitoring: As per clinic protocol. Respiration, ETCO2, SpO2, BP, heart rate and rhythm monitor placed and checked for adequate function Safety Precautions: Patient was  assessed for positional comfort and pressure points before starting the procedure. Time-out: I initiated and conducted the "Time-out" before starting the procedure, as per protocol. The patient was asked to participate by confirming the accuracy of the "Time Out" information. Verification of the correct person, site, and procedure were performed and confirmed by me, the nursing staff, and the patient. "Time-out" conducted as per Joint Commission's Universal Protocol (UP.01.01.01). Time: 1148  Description of Procedure:          Target Area: Area medial to the occipital artery at the level of the superior nuchal ridge Approach: Posterior approach Area Prepped: Entire Posterior Occipital Region DuraPrep (Iodine Povacrylex [0.7% available iodine] and Isopropyl Alcohol, 74% w/w) Safety Precautions: Aspiration looking for blood return was conducted prior to all injections. At no point did we inject any substances, as a needle was being advanced. No attempts were made at seeking any paresthesias. Safe injection practices and needle disposal techniques used. Medications properly checked for expiration dates. SDV (single dose vial) medications used. Description of the Procedure: Protocol guidelines were followed. The target area was identified and the area prepped in the usual manner. Skin & deeper tissues infiltrated with local anesthetic. Appropriate amount of time allowed to pass for local anesthetics to take effect. The procedure needles were then advanced to the target area. Proper needle placement secured. Negative aspiration confirmed. Solution injected in intermittent fashion, asking for systemic symptoms every 0.5cc of injectate. The needles were then removed and the area cleansed, making sure to leave some of the prepping solution back to take advantage of its long term bactericidal properties.  Vitals:   04/05/20 1109 04/05/20 1140 04/05/20 1155  BP: (!) 141/51 (!) 169/67 (!) 170/68  Pulse: 72 72 74   Resp: 18 (!) 23 20  Temp: (!) 97.2 F (36.2 C)    SpO2: 97% 98% 95%  Weight: 110 lb (49.9 kg)    Height: 5\' 1"  (1.549 m)      Start Time: 1148 hrs. End Time: 1152 hrs. Materials:  Needle(s)  Type: Regular needle Gauge: 25G Length: 1.5-in Medication(s): Please see orders for medications and dosing details. 4 cc solution made of 3 cc of 2% lidocaine, 1 cc of methylprednisolone, 40 mg/cc.  This was injected for the left greater occipital nerve.  Post-operative Assessment:  Post-procedure Vital Signs:  Pulse/HCG Rate: 74  Temp: (!) 97.2 F (36.2 C) Resp: 20 BP: (!) 170/68 SpO2: 95 %  EBL: None  Complications: No immediate post-treatment complications observed by team, or reported by patient.  Note: The patient tolerated the entire procedure well. A repeat set of vitals were taken after the procedure and the patient was kept under observation following institutional policy, for this type of procedure. Post-procedural neurological assessment was performed, showing return to baseline, prior to discharge. The patient was provided with post-procedure discharge instructions, including a section on how to identify potential problems. Should any problems arise concerning this procedure, the patient was given instructions to immediately contact us, at any time, without hesitation. In any case, we plan to contact the patient by telephone for a follow-up status report regarding this interventional procedure.  Comments:  No additional relevant information.  Plan of Care   Medications ordered for procedure: Meds ordered this encounter  Medications  . lidocaine (XYLOCAINE) 2 % (with pres) injection 400 mg  . methylPREDNISolone acetate (DEPO-MEDROL) injection 40 mg   Medications administered: We administered lidocaine and methylPREDNISolone acetate.  See the medical record for exact dosing, route, and time of administration.  Follow-up plan:   Return in about 4 weeks (around 05/03/2020)  for Post Procedure Evaluation.    Recent Visits Date Type Provider Dept  03/16/20 Office Visit Gillis Santa, MD [facility]  Showing recent visits within past 90 days and meeting all other requirements Today's Visits Date Type Provider Dept  04/05/20 Procedure visit Gillis Santa, MD [facility]  Showing today's visits and meeting all other requirements Future Appointments Date Type Provider Dept  05/04/20 Appointment Gillis Santa, MD [facility]  Showing future appointments within next 90 days and meeting all other requirements  Disposition: Discharge home  Discharge (Date  Time): 04/05/2020; 1203 hrs.   Primary Care Physician: Paulene Floor Location: [facility] Note by: Gillis Santa, MD Date: 04/05/2020; Time: 12:42 PM  Disclaimer:  Medicine is not an exact science. The only guarantee in medicine is that nothing is guaranteed. It is important to note that the decision to proceed with this intervention was based on the information collected from the patient. The Data and conclusions were drawn from the patient's questionnaire, the interview, and the physical examination. Because the information was provided in large part by the patient, it cannot be guaranteed that it has not been purposely or unconsciously manipulated. Every effort has been made to obtain as much relevant data as possible for this evaluation. It is important to note that the conclusions that lead to this procedure are derived in large part from the available data. Always take into account that the treatment will also be dependent on availability of resources and existing treatment guidelines, considered by other Pain Management Practitioners as being common knowledge and practice, at the time of the intervention. For Medico-Legal purposes, it is also important to point out that variation in procedural techniques and pharmacological choices are the  acceptable norm. The indications, contraindications, technique, and results of the above procedure should only be interpreted and judged by a Board-Certified Interventional Pain Specialist with extensive familiarity and expertise in the  same exact procedure and technique.

## 2020-04-05 NOTE — Progress Notes (Signed)
Safety precautions to be maintained throughout the outpatient stay will include: orient to surroundings, keep bed in low position, maintain call bell within reach at all times, provide assistance with transfer out of bed and ambulation. Safety precautions to be maintained throughout the outpatient stay will include: orient to surroundings, keep bed in low position, maintain call bell within reach at all times, provide assistance with transfer out of bed and ambulation.  

## 2020-04-05 NOTE — Patient Instructions (Signed)
____________________________________________________________________________________________  Post-Procedure Discharge Instructions  Instructions:  Apply ice:   Purpose: This will minimize any swelling and discomfort after procedure.   When: Day of procedure, as soon as you get home.  How: Fill a plastic sandwich bag with crushed ice. Cover it with a small towel and apply to injection site.  How long: (15 min on, 15 min off) Apply for 15 minutes then remove x 15 minutes.  Repeat sequence on day of procedure, until you go to bed.  Apply heat:   Purpose: To treat any soreness and discomfort from the procedure.  When: Starting the next day after the procedure.  How: Apply heat to procedure site starting the day following the procedure.  How long: May continue to repeat daily, until discomfort goes away.  Food intake: Start with clear liquids (like water) and advance to regular food, as tolerated.   Physical activities: Keep activities to a minimum for the first 8 hours after the procedure. After that, then as tolerated.  Driving: If you have received any sedation, be responsible and do not drive. You are not allowed to drive for 24 hours after having sedation.  Blood thinner: (Applies only to those taking blood thinners) You may restart your blood thinner 6 hours after your procedure.  Insulin: (Applies only to Diabetic patients taking insulin) As soon as you can eat, you may resume your normal dosing schedule.  Infection prevention: Keep procedure site clean and dry. Shower daily and clean area with soap and water.  Post-procedure Pain Diary: Extremely important that this be done correctly and accurately. Recorded information will be used to determine the next step in treatment. For the purpose of accuracy, follow these rules:  Evaluate only the area treated. Do not report or include pain from an untreated area. For the purpose of this evaluation, ignore all other areas of pain,  except for the treated area.  After your procedure, avoid taking a long nap and attempting to complete the pain diary after you wake up. Instead, set your alarm clock to go off every hour, on the hour, for the initial 8 hours after the procedure. Document the duration of the numbing medicine, and the relief you are getting from it.  Do not go to sleep and attempt to complete it later. It will not be accurate. If you received sedation, it is likely that you were given a medication that may cause amnesia. Because of this, completing the diary at a later time may cause the information to be inaccurate. This information is needed to plan your care.  Follow-up appointment: Keep your post-procedure follow-up evaluation appointment after the procedure (usually 2 weeks for most procedures, 6 weeks for radiofrequencies). DO NOT FORGET to bring you pain diary with you.   Expect: (What should I expect to see with my procedure?)  From numbing medicine (AKA: Local Anesthetics): Numbness or decrease in pain. You may also experience some weakness, which if present, could last for the duration of the local anesthetic.  Onset: Full effect within 15 minutes of injected.  Duration: It will depend on the type of local anesthetic used. On the average, 1 to 8 hours.   From steroids (Applies only if steroids were used): Decrease in swelling or inflammation. Once inflammation is improved, relief of the pain will follow.  Onset of benefits: Depends on the amount of swelling present. The more swelling, the longer it will take for the benefits to be seen. In some cases, up to 10 days.    Duration: Steroids will stay in the system x 2 weeks. Duration of benefits will depend on multiple posibilities including persistent irritating factors.  Side-effects: If present, they may typically last 2 weeks (the duration of the steroids).  Frequent: Cramps (if they occur, drink Gatorade and take over-the-counter Magnesium 450-500 mg  once to twice a day); water retention with temporary weight gain; increases in blood sugar; decreased immune system response; increased appetite.  Occasional: Facial flushing (red, warm cheeks); mood swings; menstrual changes.  Uncommon: Long-term decrease or suppression of natural hormones; bone thinning. (These are more common with higher doses or more frequent use. This is why we prefer that our patients avoid having any injection therapies in other practices.)   Very Rare: Severe mood changes; psychosis; aseptic necrosis.  From procedure: Some discomfort is to be expected once the numbing medicine wears off. This should be minimal if ice and heat are applied as instructed.  Call if: (When should I call?)  You experience numbness and weakness that gets worse with time, as opposed to wearing off.  New onset bowel or bladder incontinence. (Applies only to procedures done in the spine)  Emergency Numbers:  Durning business hours (Monday - Thursday, 8:00 AM - 4:00 PM) (Friday, 9:00 AM - 12:00 Noon): [phone]  After hours: [phone]  NOTE: If you are having a problem and are unable connect with, or to talk to a provider, then go to your nearest urgent care or emergency department. If the problem is serious and urgent, please call 911. ____________________________________________________________________________________________  Occipital Nerve Block Patient Information  Description: The occipital nerves originate in the cervical (neck) spinal cord and travel upward through muscle and tissue to supply sensation to the back of the head and top of the scalp.  In addition, the nerves control some of the muscles of the scalp.  Occipital neuralgia is an irritation of these nerves which can cause headaches, numbness of the scalp, and neck discomfort.     The occipital nerve block will interrupt nerve transmission through these nerves and can relieve pain and spasm.  The block consists  of insertion of a small needle under the skin in the back of the head to deposit local anesthetic (numbing medicine) and/or steroids around the nerve.  The entire block usually lasts less than 5 minutes.  Conditions which may be treated by occipital blocks:   Muscular pain and spasm of the scalp  Nerve irritation, back of the head  Headaches  Upper neck pain  Preparation for the injection:  1. Do not eat any solid food or dairy products within 8 hours of your appointment. 2. You may drink clear liquids up to 3 hours before appointment.  Clear liquids include water, black coffee, juice or soda.  No milk or cream please. 3. You may take your regular medication, including pain medications, with a sip of water before you appointment.  Diabetics should hold regular insulin (if taken separately) and take 1/2 normal NPH dose the morning of the procedure.  Carry some sugar containing items with you to your appointment. 4. A driver must accompany you and be prepared to drive you home after your procedure. 5. Bring all your current medications with you. 6. An IV may be inserted and sedation may be given at the discretion of the physician. 7. A blood pressure cuff, EKG, and other monitors will often be applied during the procedure.  Some patients may need to have extra oxygen administered for a short period.  8. You will be asked to provide medical information, including your allergies and medications, prior to the procedure.  We must know immediately if you are taking blood thinners (like Coumadin/Warfarin) or if you are allergic to IV iodine contrast (dye).  We must know if you could possible be pregnant.  9. Do not wear a high collared shirt or turtleneck.  Tie long hair up in the back if possible.  Possible side-effects:   Bleeding from needle site  Infection (rare, may require surgery)  Nerve injury (rare)  Hair on back of neck can be tinged with iodine scrub (this will wash  out)  Light-headedness (temporary)  Pain at injection site (several days)  Decreased blood pressure (rare, temporary)  Seizure (very rare)  Call if you experience:   Hives or difficulty breathing ( go to the emergency room)  Inflammation or drainage at the injection site(s)  Please note:  Although the local anesthetic injected can often make your painful muscles or headache feel good for several hours after the injection, the pain may return.  It takes 3-7 days for steroids to work.  You may not notice any pain relief for at least one week.  If effective, we will often do a series of injections spaced 3-6 weeks apart to maximally decrease your pain.  If you have any questions, please call [phone] [facility]

## 2020-04-05 NOTE — Progress Notes (Signed)
Safety precautions to be maintained throughout the outpatient stay will include: orient to surroundings, keep bed in low position, maintain call bell within reach at all times, provide assistance with transfer out of bed and ambulation.  

## 2020-04-06 ENCOUNTER — Telehealth: Payer: Self-pay | Admitting: *Deleted

## 2020-04-06 NOTE — Telephone Encounter (Signed)
Attempted to call for post procedure follow-up. Message left. 

## 2020-04-07 DIAGNOSIS — B351 Tinea unguium: Secondary | ICD-10-CM

## 2020-04-12 DIAGNOSIS — R5381 Other malaise: Secondary | ICD-10-CM | POA: Diagnosis not present

## 2020-04-14 ENCOUNTER — Other Ambulatory Visit: Payer: Self-pay | Admitting: Physician Assistant

## 2020-04-14 DIAGNOSIS — R441 Visual hallucinations: Secondary | ICD-10-CM

## 2020-04-14 DIAGNOSIS — G4700 Insomnia, unspecified: Secondary | ICD-10-CM

## 2020-04-14 NOTE — Telephone Encounter (Signed)
Requested  medications are  due for refill today yes  Requested medications are on the active medication list yes  Last refill Trazadone 6/2  Last visit Can not find visit that addresses Trazodone, the Allopurinol is from a Historical Provider Future visit scheduled 7/30  Notes to clinic Can not find a visit that addresses Trazodone but has appt next week.

## 2020-04-18 ENCOUNTER — Telehealth: Payer: Self-pay | Admitting: Physician Assistant

## 2020-04-18 DIAGNOSIS — I130 Hypertensive heart and chronic kidney disease with heart failure and stage 1 through stage 4 chronic kidney disease, or unspecified chronic kidney disease: Secondary | ICD-10-CM | POA: Diagnosis not present

## 2020-04-18 DIAGNOSIS — N183 Chronic kidney disease, stage 3 unspecified: Secondary | ICD-10-CM | POA: Diagnosis not present

## 2020-04-18 DIAGNOSIS — D333 Benign neoplasm of cranial nerves: Secondary | ICD-10-CM | POA: Diagnosis not present

## 2020-04-18 DIAGNOSIS — H9190 Unspecified hearing loss, unspecified ear: Secondary | ICD-10-CM | POA: Diagnosis not present

## 2020-04-18 DIAGNOSIS — M5481 Occipital neuralgia: Secondary | ICD-10-CM | POA: Diagnosis not present

## 2020-04-18 DIAGNOSIS — E785 Hyperlipidemia, unspecified: Secondary | ICD-10-CM | POA: Diagnosis not present

## 2020-04-18 DIAGNOSIS — J8410 Pulmonary fibrosis, unspecified: Secondary | ICD-10-CM | POA: Diagnosis not present

## 2020-04-18 DIAGNOSIS — M1990 Unspecified osteoarthritis, unspecified site: Secondary | ICD-10-CM | POA: Diagnosis not present

## 2020-04-18 DIAGNOSIS — G894 Chronic pain syndrome: Secondary | ICD-10-CM | POA: Diagnosis not present

## 2020-04-18 DIAGNOSIS — Z7982 Long term (current) use of aspirin: Secondary | ICD-10-CM | POA: Diagnosis not present

## 2020-04-18 DIAGNOSIS — H548 Legal blindness, as defined in USA: Secondary | ICD-10-CM | POA: Diagnosis not present

## 2020-04-18 DIAGNOSIS — R410 Disorientation, unspecified: Secondary | ICD-10-CM | POA: Diagnosis not present

## 2020-04-18 DIAGNOSIS — J449 Chronic obstructive pulmonary disease, unspecified: Secondary | ICD-10-CM | POA: Diagnosis not present

## 2020-04-18 DIAGNOSIS — I4891 Unspecified atrial fibrillation: Secondary | ICD-10-CM | POA: Diagnosis not present

## 2020-04-18 DIAGNOSIS — R441 Visual hallucinations: Secondary | ICD-10-CM | POA: Diagnosis not present

## 2020-04-18 DIAGNOSIS — I482 Chronic atrial fibrillation, unspecified: Secondary | ICD-10-CM | POA: Diagnosis not present

## 2020-04-18 DIAGNOSIS — I2510 Atherosclerotic heart disease of native coronary artery without angina pectoris: Secondary | ICD-10-CM | POA: Diagnosis not present

## 2020-04-18 DIAGNOSIS — I509 Heart failure, unspecified: Secondary | ICD-10-CM | POA: Diagnosis not present

## 2020-04-18 DIAGNOSIS — Z7952 Long term (current) use of systemic steroids: Secondary | ICD-10-CM | POA: Diagnosis not present

## 2020-04-18 DIAGNOSIS — F331 Major depressive disorder, recurrent, moderate: Secondary | ICD-10-CM | POA: Diagnosis not present

## 2020-04-18 DIAGNOSIS — M1030 Gout due to renal impairment, unspecified site: Secondary | ICD-10-CM | POA: Diagnosis not present

## 2020-04-18 NOTE — Telephone Encounter (Signed)
Home Health Verbal Orders - Caller/Agency: [facility] Number: [phone] Requesting OT/PT/Skilled Nursing/Social Work/Speech Therapy: PT  Frequency:  1w2 2w2 1w4

## 2020-04-18 NOTE — Telephone Encounter (Signed)
OK to give verbal.

## 2020-04-19 NOTE — Telephone Encounter (Signed)
[name] w/ [facility] to advise of verbal order and no answer. Left voicemail message for [name] to return call, if [name] calls back okay for [facility] to advise of order.

## 2020-04-20 NOTE — Telephone Encounter (Signed)
Phone call returned to [place] with [facility].  Advised of approval for PT as requested: 1w2, 2w2, 1w4, per Carles Collet.  Verb. Understanding.

## 2020-04-20 NOTE — Progress Notes (Signed)
Established patient visit   Patient: Marilyn Oconnell   DOB: 1923/12/24   86 y.o. Female  MRN: [id number] Visit Date: 04/21/2020  Today's healthcare provider: Trinna Post, PA-C   Chief Complaint  Patient presents with  . Hospitalization Follow-up  I,Porsha C McClurkin,acting as a scribe for Trinna Post, PA-C.,have documented all relevant documentation on the behalf of Trinna Post, PA-C,as directed by  Trinna Post, PA-C while in the presence of Trinna Post, PA-C.  Subjective    HPI  Follow up Hospitalization  Patient was admitted to [facility] on 03/22/20 and discharged on 03/27/20. She was treated for Atrial Fibrillation and COPD. Treatment for this included diltiazem drip and transitioned to PO metoprolol. She experienced some delirium in the hospital and was treated with Seroquel, which caused a lot of agitation. She was discharged to a SNF and is now back to living at home independently.  Telephone follow up was not done.  She reports good compliance with treatment. She reports this condition is improved.  She was seen by cardiology on 03/29/2020. At that time her BP was well controlled however she was having some dizziness so she was decreased to hydralazine 10 mg BID. She continued metoprolol succinate 75 mg daily. Most recently however caretaker reports that she has been having high BP and PT has been unable to perform services due to this.   Additionally, she had CT and MRI on 03/25/2020 during her hospitalization which revealed right vestibular schwanoma. She is a patient of [facility] though never officially got a scan with them before.  ----------------------------------------------------------------------------------------- -       Medications: Outpatient Medications Prior to Visit  Medication Sig  . acetaminophen (TYLENOL) 500 MG tablet Take 500-1,000 mg by mouth 2 (two) times daily as needed for mild pain, moderate pain, fever or headache.   .  albuterol (PROVENTIL) (2.5 MG/3ML) 0.083% nebulizer solution Take 2.5 mg by nebulization every 8 (eight) hours as needed for wheezing or shortness of breath.   . allopurinol (ZYLOPRIM) 100 MG tablet Take 100 mg by mouth every evening.   . budesonide (PULMICORT) 0.5 MG/2ML nebulizer solution Take 0.5 mg by nebulization at bedtime.  . diclofenac Sodium (VOLTAREN) 1 % GEL Apply topically.  Marland Kitchen escitalopram (LEXAPRO) 20 MG tablet TAKE 1 TABLET BY MOUTH  DAILY (Patient taking differently: Take 20 mg by mouth daily. )  . Fluticasone-Salmeterol (ADVAIR DISKUS) 250-50 MCG/DOSE AEPB USE ONE (1) INHALATION BY MOUTH EVERY 12 HOURS. RINSE MOUTH OUT AFTERUSE.  . furosemide (LASIX) 20 MG tablet TAKE 1 TABLET BY MOUTH  DAILY (Patient taking differently: Take 20 mg by mouth daily. )  . hydrALAZINE (APRESOLINE) 10 MG tablet Take 10 mg by mouth 2 (two) times daily.  Marland Kitchen ipratropium-albuterol (DUONEB) 0.5-2.5 (3) MG/3ML SOLN Take 3 mLs by nebulization 3 (three) times daily.  . metoprolol succinate (TOPROL-XL) 50 MG 24 hr tablet Take 1.5 tablets (75 mg total) by mouth daily. Take with or immediately following a meal.  . aspirin EC 81 MG tablet Take 81 mg by mouth daily. (Patient not taking: Reported on 04/21/2020)  . hydrALAZINE (APRESOLINE) 25 MG tablet Take 1 tablet (25 mg total) by mouth every 6 (six) hours. (Patient not taking: Reported on 04/21/2020)  . simvastatin (ZOCOR) 20 MG tablet TAKE 1 TABLET BY MOUTH AT  BEDTIME (Patient not taking: Reported on 04/21/2020)  . traZODone (DESYREL) 50 MG tablet TAKE ONE-HALF TABLET BY  MOUTH AT BEDTIME (Patient not taking:  Reported on 04/21/2020)   No facility-administered medications prior to visit.    Review of Systems    Objective    BP (!) 171/70 (BP Location: Left Arm, Patient Position: Sitting, Cuff Size: Normal)   Pulse 64   Temp 98.2 F (36.8 C) (Oral)   SpO2 95%    Physical Exam Constitutional:      Comments: Frail appearing.   Cardiovascular:     Rate and  Rhythm: Normal rate and regular rhythm.     Heart sounds: Murmur heard.   Pulmonary:     Effort: Pulmonary effort is normal.     Breath sounds: Normal breath sounds.  Skin:    General: Skin is warm and dry.  Neurological:     Mental Status: She is alert. Mental status is at baseline.  Psychiatric:        Mood and Affect: Mood normal.        Behavior: Behavior normal.       No results found for any visits on 04/21/20.  Assessment & Plan    1. Atrial fibrillation with RVR ([place])   2. Essential hypertension Patient's blood pressure was elevated at office visit.  Add losartan as below. Avoid amlodipine and diuretics due to severe aortic stenosis. F/u 1 month.   - losartan (COZAAR) 25 MG tablet; Take 1 tablet (25 mg total) by mouth daily.  Dispense: 90 tablet; Refill: 0  3. Vestibular Schwanoma   Typically benign, may be contributing to dizziness. I am not sure what treatments are available to her. Since they are established patients at [facility] I recommend they call and schedule appointment.   No follow-ups on file.      ITrinna Post, PA-C, have reviewed all documentation for this visit. The documentation on 04/25/20 for the exam, diagnosis, procedures, and orders are all accurate and complete.    Paulene Floor  [facility] [phone] (phone) [phone] (fax)  [place]

## 2020-04-21 ENCOUNTER — Encounter: Payer: Self-pay | Admitting: Physician Assistant

## 2020-04-21 ENCOUNTER — Ambulatory Visit: Payer: Medicare Other | Admitting: Physician Assistant

## 2020-04-21 ENCOUNTER — Other Ambulatory Visit: Payer: Self-pay | Admitting: Physician Assistant

## 2020-04-21 ENCOUNTER — Other Ambulatory Visit: Payer: Self-pay

## 2020-04-21 ENCOUNTER — Telehealth: Payer: Self-pay | Admitting: Physician Assistant

## 2020-04-21 ENCOUNTER — Ambulatory Visit (INDEPENDENT_AMBULATORY_CARE_PROVIDER_SITE_OTHER): Payer: Medicare Other | Admitting: Physician Assistant

## 2020-04-21 VITALS — BP 171/70 | HR 64 | Temp 98.2°F

## 2020-04-21 DIAGNOSIS — F320 Major depressive disorder, single episode, mild: Secondary | ICD-10-CM | POA: Diagnosis not present

## 2020-04-21 DIAGNOSIS — I4891 Unspecified atrial fibrillation: Secondary | ICD-10-CM

## 2020-04-21 DIAGNOSIS — I10 Essential (primary) hypertension: Secondary | ICD-10-CM

## 2020-04-21 DIAGNOSIS — D333 Benign neoplasm of cranial nerves: Secondary | ICD-10-CM | POA: Diagnosis not present

## 2020-04-21 MED ORDER — LOSARTAN POTASSIUM 25 MG PO TABS: 25.0000 mg | ORAL_TABLET | Freq: Every day | ORAL | 0 refills | Status: DC

## 2020-04-21 NOTE — Telephone Encounter (Signed)
hydrALAZINE (APRESOLINE) 25 MG tablet     Patient is requesting refill. Patient is out of this medication.    [place], [place] Phone:  [phone]  Fax:  [phone]

## 2020-04-21 NOTE — Telephone Encounter (Signed)
Requested medication (s) are due for refill today: no  Requested medication (s) are on the active medication list: yes   Future visit scheduled: no  Notes to clinic:  medication filled by different provider Review for refill   Requested Prescriptions  Pending Prescriptions Disp Refills   hydrALAZINE (APRESOLINE) 25 MG tablet 120 tablet 0    Sig: Take 1 tablet (25 mg total) by mouth every 6 (six) hours.      Cardiovascular:  Vasodilators Failed - 04/21/2020  1:20 PM      Failed - HCT in normal range and within 360 days    HCT  Date Value Ref Range Status  03/24/2020 31.7 (L) 36 - 46 % Final   Hematocrit  Date Value Ref Range Status  02/26/2019 33.2 (L) 34.0 - 46.6 % Final          Failed - HGB in normal range and within 360 days    Hemoglobin  Date Value Ref Range Status  03/24/2020 11.0 (L) 12.0 - 15.0 g/dL Final  02/26/2019 11.4 11.1 - 15.9 g/dL Final          Failed - RBC in normal range and within 360 days    RBC  Date Value Ref Range Status  03/24/2020 3.51 (L) 3.87 - 5.11 MIL/uL Final          Failed - WBC in normal range and within 360 days    WBC  Date Value Ref Range Status  03/24/2020 11.0 (H) 4.0 - 10.5 K/uL Final          Failed - Last BP in normal range    BP Readings from Last 1 Encounters:  04/21/20 (!) 171/70          Passed - PLT in normal range and within 360 days    Platelets  Date Value Ref Range Status  03/24/2020 208 150 - 400 K/uL Final  02/26/2019 195 150 - 450 x10E3/uL Final          Passed - Valid encounter within last 12 months    Recent Outpatient Visits           Today Atrial fibrillation with RVR [facility])   [place], [place], PA-C   2 months ago Chronic nonintractable headache, unspecified headache type   [place], Thompson, PA-C   8 months ago Syncope, unspecified syncope type   [facility] [place], Wendee Beavers, [place]   1 year ago [place], Wendee Beavers, [place]   1 year ago Need for influenza vaccination   [facility] [place], [place]

## 2020-04-21 NOTE — Telephone Encounter (Signed)
Copied from [place] [id number]. Topic: Quick Communication - Rx Refill/Question >> Apr 21, 2020  4:15 PM Leward Quan A wrote: Medication: hydrALAZINE (APRESOLINE) 10 MG tablet  Has the patient contacted their pharmacy? Yes.   (Agent: If no, request that the patient contact the pharmacy for the refill.) (Agent: If yes, when and what did the pharmacy advise?)  Preferred Pharmacy (with phone number or street name): [place], [place]  Phone:  [phone] Fax:  [phone]     Agent: Please be advised that RX refills may take up to 3 business days. We ask that you follow-up with your pharmacy.

## 2020-04-21 NOTE — Telephone Encounter (Signed)
Patient scheduled for 1 month follow up.

## 2020-04-21 NOTE — Telephone Encounter (Signed)
Spoke with Marilyn Oconnell about her new blood pressure medicine. We sent in Losartan 25 mg QD to add onto her other medications. Can we schedule patient for one month follow up for blood pressure check?

## 2020-04-21 NOTE — Telephone Encounter (Signed)
Requested medication (s) are due for refill today:  Yes  Requested medication (s) are on the active medication list:  Yes  Future visit scheduled:  Yes  Last Refill:  Historical Provider 03/29/20  Requested Prescriptions  Pending Prescriptions Disp Refills   hydrALAZINE (APRESOLINE) 10 MG tablet      Sig: Take 1 tablet (10 mg total) by mouth 2 (two) times daily.      Cardiovascular:  Vasodilators Failed - 04/21/2020  4:26 PM      Failed - HCT in normal range and within 360 days    HCT  Date Value Ref Range Status  03/24/2020 31.7 (L) 36 - 46 % Final   Hematocrit  Date Value Ref Range Status  02/26/2019 33.2 (L) 34.0 - 46.6 % Final          Failed - HGB in normal range and within 360 days    Hemoglobin  Date Value Ref Range Status  03/24/2020 11.0 (L) 12.0 - 15.0 g/dL Final  02/26/2019 11.4 11.1 - 15.9 g/dL Final          Failed - RBC in normal range and within 360 days    RBC  Date Value Ref Range Status  03/24/2020 3.51 (L) 3.87 - 5.11 MIL/uL Final          Failed - WBC in normal range and within 360 days    WBC  Date Value Ref Range Status  03/24/2020 11.0 (H) 4.0 - 10.5 K/uL Final          Failed - Last BP in normal range    BP Readings from Last 1 Encounters:  04/21/20 (!) 171/70          Passed - PLT in normal range and within 360 days    Platelets  Date Value Ref Range Status  03/24/2020 208 150 - 400 K/uL Final  02/26/2019 195 150 - 450 x10E3/uL Final          Passed - Valid encounter within last 12 months    Recent Outpatient Visits           Today Atrial fibrillation with RVR [facility])   [place], [place], PA-C   2 months ago Chronic nonintractable headache, unspecified headache type   [place], [place], PA-C   8 months ago Syncope, unspecified syncope type   [facility] [place], Wendee Beavers, [place]   1 year ago [place], Wendee Beavers, [place]   1  year ago Need for influenza vaccination   [facility] [place], Herbie [place], [place]       Future Appointments             In 1 month Terrilee Croak, Wendee Beavers, [place], [facility]

## 2020-04-24 DIAGNOSIS — I509 Heart failure, unspecified: Secondary | ICD-10-CM | POA: Diagnosis not present

## 2020-04-24 DIAGNOSIS — J449 Chronic obstructive pulmonary disease, unspecified: Secondary | ICD-10-CM | POA: Diagnosis not present

## 2020-04-24 DIAGNOSIS — I2510 Atherosclerotic heart disease of native coronary artery without angina pectoris: Secondary | ICD-10-CM | POA: Diagnosis not present

## 2020-04-24 DIAGNOSIS — I4891 Unspecified atrial fibrillation: Secondary | ICD-10-CM | POA: Diagnosis not present

## 2020-04-24 DIAGNOSIS — I130 Hypertensive heart and chronic kidney disease with heart failure and stage 1 through stage 4 chronic kidney disease, or unspecified chronic kidney disease: Secondary | ICD-10-CM | POA: Diagnosis not present

## 2020-04-24 DIAGNOSIS — N183 Chronic kidney disease, stage 3 unspecified: Secondary | ICD-10-CM | POA: Diagnosis not present

## 2020-04-24 MED ORDER — HYDRALAZINE HCL 10 MG PO TABS: 10.0000 mg | ORAL_TABLET | Freq: Two times a day (BID) | ORAL | 0 refills | Status: DC

## 2020-04-24 NOTE — Telephone Encounter (Signed)
Patient on new dose which is 10 mg BID. I have sent this in.

## 2020-04-25 DIAGNOSIS — F320 Major depressive disorder, single episode, mild: Secondary | ICD-10-CM | POA: Insufficient documentation

## 2020-04-30 ENCOUNTER — Other Ambulatory Visit: Payer: Self-pay | Admitting: Physician Assistant

## 2020-04-30 DIAGNOSIS — R441 Visual hallucinations: Secondary | ICD-10-CM

## 2020-04-30 DIAGNOSIS — G4700 Insomnia, unspecified: Secondary | ICD-10-CM

## 2020-05-01 DIAGNOSIS — I130 Hypertensive heart and chronic kidney disease with heart failure and stage 1 through stage 4 chronic kidney disease, or unspecified chronic kidney disease: Secondary | ICD-10-CM | POA: Diagnosis not present

## 2020-05-01 DIAGNOSIS — I509 Heart failure, unspecified: Secondary | ICD-10-CM | POA: Diagnosis not present

## 2020-05-01 DIAGNOSIS — J449 Chronic obstructive pulmonary disease, unspecified: Secondary | ICD-10-CM | POA: Diagnosis not present

## 2020-05-01 DIAGNOSIS — I4891 Unspecified atrial fibrillation: Secondary | ICD-10-CM | POA: Diagnosis not present

## 2020-05-01 DIAGNOSIS — N183 Chronic kidney disease, stage 3 unspecified: Secondary | ICD-10-CM | POA: Diagnosis not present

## 2020-05-01 DIAGNOSIS — I2510 Atherosclerotic heart disease of native coronary artery without angina pectoris: Secondary | ICD-10-CM | POA: Diagnosis not present

## 2020-05-02 ENCOUNTER — Telehealth: Payer: Self-pay

## 2020-05-02 DIAGNOSIS — N183 Chronic kidney disease, stage 3 unspecified: Secondary | ICD-10-CM | POA: Diagnosis not present

## 2020-05-02 DIAGNOSIS — I130 Hypertensive heart and chronic kidney disease with heart failure and stage 1 through stage 4 chronic kidney disease, or unspecified chronic kidney disease: Secondary | ICD-10-CM | POA: Diagnosis not present

## 2020-05-02 DIAGNOSIS — I4891 Unspecified atrial fibrillation: Secondary | ICD-10-CM | POA: Diagnosis not present

## 2020-05-02 DIAGNOSIS — I509 Heart failure, unspecified: Secondary | ICD-10-CM | POA: Diagnosis not present

## 2020-05-02 DIAGNOSIS — I2510 Atherosclerotic heart disease of native coronary artery without angina pectoris: Secondary | ICD-10-CM | POA: Diagnosis not present

## 2020-05-02 DIAGNOSIS — J449 Chronic obstructive pulmonary disease, unspecified: Secondary | ICD-10-CM | POA: Diagnosis not present

## 2020-05-02 NOTE — Telephone Encounter (Signed)
OK to give verbal.

## 2020-05-02 NOTE — Telephone Encounter (Signed)
Copied from [place] [id number]. Topic: General - Inquiry >> May 02, 2020  1:56 PM Mathis Bud wrote: Reason for CRM: [facility] called requesting verbal orders for OT for  1x a week for 5 weeks.Marland Kitchen kat did say a voicemail if she does not answer.  Call back [phone]

## 2020-05-02 NOTE — Telephone Encounter (Signed)
Please review. Thanks!  

## 2020-05-03 NOTE — Telephone Encounter (Signed)
L/M advising below.  

## 2020-05-04 ENCOUNTER — Ambulatory Visit: Payer: Medicare Other | Admitting: Student in an Organized Health Care Education/Training Program

## 2020-05-05 ENCOUNTER — Encounter: Payer: Self-pay | Admitting: Student in an Organized Health Care Education/Training Program

## 2020-05-06 DIAGNOSIS — I130 Hypertensive heart and chronic kidney disease with heart failure and stage 1 through stage 4 chronic kidney disease, or unspecified chronic kidney disease: Secondary | ICD-10-CM | POA: Diagnosis not present

## 2020-05-06 DIAGNOSIS — I4891 Unspecified atrial fibrillation: Secondary | ICD-10-CM | POA: Diagnosis not present

## 2020-05-06 DIAGNOSIS — I509 Heart failure, unspecified: Secondary | ICD-10-CM | POA: Diagnosis not present

## 2020-05-06 DIAGNOSIS — I2510 Atherosclerotic heart disease of native coronary artery without angina pectoris: Secondary | ICD-10-CM | POA: Diagnosis not present

## 2020-05-06 DIAGNOSIS — J449 Chronic obstructive pulmonary disease, unspecified: Secondary | ICD-10-CM | POA: Diagnosis not present

## 2020-05-06 DIAGNOSIS — N183 Chronic kidney disease, stage 3 unspecified: Secondary | ICD-10-CM | POA: Diagnosis not present

## 2020-05-08 ENCOUNTER — Other Ambulatory Visit: Payer: Self-pay

## 2020-05-08 ENCOUNTER — Ambulatory Visit
Payer: Medicare Other | Attending: Student in an Organized Health Care Education/Training Program | Admitting: Student in an Organized Health Care Education/Training Program

## 2020-05-08 ENCOUNTER — Encounter: Payer: Self-pay | Admitting: Student in an Organized Health Care Education/Training Program

## 2020-05-08 DIAGNOSIS — G894 Chronic pain syndrome: Secondary | ICD-10-CM | POA: Diagnosis not present

## 2020-05-08 DIAGNOSIS — G8929 Other chronic pain: Secondary | ICD-10-CM | POA: Diagnosis not present

## 2020-05-08 DIAGNOSIS — M5481 Occipital neuralgia: Secondary | ICD-10-CM

## 2020-05-08 DIAGNOSIS — R519 Headache, unspecified: Secondary | ICD-10-CM

## 2020-05-08 NOTE — Assessment & Plan Note (Signed)
Orders Placed This Encounter  Procedures  . GREATER OCCIPITAL NERVE BLOCK    Standing Status:   Standing    Number of Occurrences:   6    Standing Expiration Date:   05/08/2021    Scheduling Instructions:     Procedure: Occipital nerve block     Laterality:left     Sedation: Patient's choice.     Timeframe: PRN Procedure. Patient will call.    Order Specific Question:   Where will this procedure be performed?    Answer:   [facility]

## 2020-05-08 NOTE — Progress Notes (Signed)
Patient: Marilyn Oconnell  Service Category: E/M  Provider: Gillis Santa, MD  DOB: [birth year]  DOS: 05/08/2020  Location: Office  MRN: [id number]  Setting: Ambulatory outpatient  Referring Provider: Trinna Post, PA-C  Type: Established Patient  Specialty: Interventional Pain Management  PCP: Trinna Post, PA-C  Location: Home  Delivery: TeleHealth     Virtual Encounter - Pain Management PROVIDER NOTE: Information contained herein reflects review and annotations entered in association with encounter. Interpretation of such information and data should be left to medically-trained personnel. Information provided to patient can be located elsewhere in the medical record under "Patient Instructions". Document created using STT-dictation technology, any transcriptional errors that may result from process are unintentional.    Contact & Pharmacy Preferred: [phone] Home: [phone] (home) Mobile: [phone] (mobile) E-mail: No e-mail address on record  [place], [place] - [address] [place] [address] Phone: [phone] Fax: [phone]  [place], [place], [address] [place], [place] [address] [place] [address] Phone: [phone] Fax: [phone]   Pre-screening  Marilyn Oconnell offered "in-person" vs "virtual" encounter. She indicated preferring virtual for this encounter.   Reason COVID-19*  Social distancing based on CDC and AMA recommendations.   I contacted Marilyn Oconnell on 05/08/2020 via telephone.      I clearly identified myself as Gillis Santa, MD. I verified that I was speaking with the correct person using two identifiers (Name: Marilyn Oconnell, and date of birth: 04-19-1924).  Consent I sought verbal advanced consent from Marilyn Oconnell for virtual visit interactions. I informed Marilyn Oconnell of possible security and privacy concerns, risks, and limitations associated with  providing "not-in-person" medical evaluation and management services. I also informed Marilyn Oconnell of the availability of "in-person" appointments. Finally, I informed her that there would be a charge for the virtual visit and that she could be  personally, fully or partially, financially responsible for it. Marilyn Oconnell expressed understanding and agreed to proceed.   Historic Elements   Marilyn Oconnell is a 86 y.o. year old, female patient evaluated today after her last contact with our practice on 04/06/2020. Marilyn Oconnell  has a past medical history of Arthritis, CHF (congestive heart failure) ([place]), COPD (chronic obstructive pulmonary disease) ([place]), Coronary artery disease, Hypercholesteremia, and Hypertension. She also  has a past surgical history that includes Shoulder surgery (09/1999); Sigmoidoscopy (1992); Dilation and curettage of uterus (1988); and Aortic valve replacement (2006). Marilyn Oconnell has a current medication list which includes the following prescription(s): acetaminophen, albuterol, allopurinol, budesonide, diclofenac sodium, escitalopram, fluticasone-salmeterol, furosemide, hydralazine, ipratropium-albuterol, losartan, metoprolol succinate, aspirin ec, simvastatin, and trazodone. She  reports that she has never smoked. She has never used smokeless tobacco. She reports that she does not drink alcohol and does not use drugs. Marilyn Oconnell is allergic to iodine, shellfish allergy, azithromycin, and sulfa antibiotics.   HPI  Today, she is being contacted for a post-procedure assessment.    Post-Procedure Evaluation  Procedure (04/05/2020):   Type: Diagnostic, Greater, Occipital Nerve Block  #1  Region: Posterolateral Cervical Level: Occipital Ridge   Laterality: Left-Sided  Type: Local Anesthesia  Local Anesthetic: Lidocaine 1-2%  Position: Prone   Indications: 1. Occipital neuralgia of left side   2. Chronic left-sided headaches      Sedation: Please see nurses note.   Effectiveness during initial hour after procedure(Ultra-Short Term Relief): 100 %   Local anesthetic  used: Long-acting (4-6 hours) Effectiveness: Defined as any analgesic benefit obtained secondary to the administration of local anesthetics. This carries significant diagnostic value as to the etiological location, or anatomical origin, of the pain. Duration of benefit is expected to coincide with the duration of the local anesthetic used.  Effectiveness during initial 4-6 hours after procedure(Short-Term Relief): 100 %   Long-term benefit: Defined as any relief past the pharmacologic duration of the local anesthetics.  Effectiveness past the initial 6 hours after procedure(Long-Term Relief): 100 % (pain began to return approx 2 weeks post procedure but is not as servere as pre-procedure.)   Current benefits: Defined as benefit that persist at this time.   Analgesia:  >50% relief Function: Marilyn Oconnell reports improvement in function   Laboratory Chemistry Profile   Renal Lab Results  Component Value Date   BUN 38 (H) 03/27/2020   CREATININE 1.47 (H) 03/27/2020   BCR 17 02/26/2019   GFRAA 35 (L) 03/27/2020   GFRNONAA 30 (L) 03/27/2020     Hepatic Lab Results  Component Value Date   AST 15 03/23/2020   ALT 9 03/23/2020   ALBUMIN 3.4 (L) 03/23/2020   ALKPHOS 58 03/23/2020   LIPASE 25 11/27/2015     Electrolytes Lab Results  Component Value Date   NA 136 03/27/2020   K 3.9 03/27/2020   CL 99 03/27/2020   CALCIUM 9.4 03/27/2020   MG 1.9 03/22/2020   PHOS 3.5 12/26/2017     Bone No results found for: VD25OH, VD125OH2TOT, WG6659DJ5, TS1779TJ0, 25OHVITD1, 25OHVITD2, 25OHVITD3, TESTOFREE, TESTOSTERONE   Inflammation (CRP: Acute Phase) (ESR: Chronic Phase) No results found for: CRP, ESRSEDRATE, LATICACIDVEN     Note: Above Lab results reviewed.   Imaging  MR BRAIN WO CONTRAST CLINICAL DATA:  86 year old female with unexplained altered mental status. Age indeterminate  right basal ganglia lacunar infarct on head CT yesterday.  EXAM: MRI HEAD WITHOUT CONTRAST  TECHNIQUE: Multiplanar, multiecho pulse sequences of the brain and surrounding structures were obtained without intravenous contrast.  COMPARISON:  Head CT 03/24/2020 and earlier.  FINDINGS: Brain: No restricted diffusion or evidence of acute infarction.  Mineralization and chronic T2/FLAIR heterogeneity in the bilateral basal ganglia and thalami, including the small area on the right by CT yesterday. Superimposed confluent white matter T2 and FLAIR hyperintensity. Mild to moderate similar T2 signal heterogeneity in the pons. No cortical encephalomalacia or definite chronic cerebral blood products.  No midline shift, mass effect, ventriculomegaly, extra-axial collection or acute intracranial hemorrhage. Cervicomedullary junction and pituitary are within normal limits.  Vascular: Major intracranial vascular flow voids are preserved; slow flow is suspected in the right sigmoid sinus and IJ bulb.  Skull and upper cervical spine: Negative for age visible cervical spine, bone marrow signal.  Sinuses/Orbits: Postoperative changes to both globes, otherwise negative orbits. Paranasal sinuses and mastoids are stable and well pneumatized.  Other: There is a 9 mm nodular soft tissue mass at the right cerebellopontine angle associated with loss of the normal right internal auditory canal appearance (series 10, image 8 and series 16, image 39. The contralateral left internal auditory structures appear grossly normal.  Scalp and face soft tissues appear negative.  IMPRESSION: 1. No acute or subacute ischemia identified. Moderately advanced for age chronic small vessel disease, including at the right basal ganglia seen by CT yesterday.  2. 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 Query associated sensorineural hearing  loss or tinnitus.  Electronically Signed   By: Genevie Ann M.D.   On: 03/25/2020 07:59  Assessment  The primary encounter diagnosis was Occipital neuralgia of left side. Diagnoses of Chronic left-sided headaches and Chronic pain syndrome were also pertinent to this visit.  Analgesic benefit, reduction in headache intensity and frequency after left greater occipital nerve block.  Continue to endorse benefit, repeat as needed.  Plan of Care  Problem-specific:  Occipital neuralgia of left side Orders Placed This Encounter  Procedures  . GREATER OCCIPITAL NERVE BLOCK    Standing Status:   Standing    Number of Occurrences:   6    Standing Expiration Date:   05/08/2021    Scheduling Instructions:     Procedure: Occipital nerve block     Laterality:left     Sedation: Patient's choice.     Timeframe: PRN Procedure. Patient will call.    Order Specific Question:   Where will this procedure be performed?    Answer:   [facility]     Marilyn Oconnell has a current medication list which includes the following long-term medication(s): budesonide, escitalopram, fluticasone-salmeterol, furosemide, hydralazine, ipratropium-albuterol, losartan, metoprolol succinate, simvastatin, and trazodone.   Orders:  Orders Placed This Encounter  Procedures  . GREATER OCCIPITAL NERVE BLOCK    Standing Status:   Standing    Number of Occurrences:   6    Standing Expiration Date:   05/08/2021    Scheduling Instructions:     Procedure: Occipital nerve block     Laterality:left     Sedation: Patient's choice.     Timeframe: PRN Procedure. Patient will call.    Order Specific Question:   Where will this procedure be performed?    Answer:   [facility]   Follow-up plan:   No follow-ups on file.     Status post left occipital nerve block on 04/05/2020   Recent Visits Date Type Provider Dept  04/05/20 Procedure visit Gillis Santa, MD [facility]  03/16/20 Office Visit Gillis Santa, MD [facility]  Showing recent visits within past 90 days and meeting all other requirements Today's Visits Date Type Provider Dept  05/08/20 Telemedicine Gillis Santa, MD [facility]  Showing today's visits and meeting all other requirements Future Appointments No visits were found meeting these conditions. Showing future appointments within next 90 days and meeting all other requirements  I discussed the assessment and treatment plan with the patient. The patient was provided an opportunity to ask questions and all were answered. The patient agreed with the plan and demonstrated an understanding of the instructions.  Patient advised to call back or seek an in-person evaluation if the symptoms or condition worsens.  Duration of encounter: 20 minutes.  Note by: Gillis Santa, MD Date: 05/08/2020; Time: 9:47 AM

## 2020-05-09 ENCOUNTER — Telehealth: Payer: Self-pay | Admitting: Physician Assistant

## 2020-05-09 DIAGNOSIS — N183 Chronic kidney disease, stage 3 unspecified: Secondary | ICD-10-CM | POA: Diagnosis not present

## 2020-05-09 DIAGNOSIS — I509 Heart failure, unspecified: Secondary | ICD-10-CM | POA: Diagnosis not present

## 2020-05-09 DIAGNOSIS — J449 Chronic obstructive pulmonary disease, unspecified: Secondary | ICD-10-CM | POA: Diagnosis not present

## 2020-05-09 DIAGNOSIS — I2510 Atherosclerotic heart disease of native coronary artery without angina pectoris: Secondary | ICD-10-CM | POA: Diagnosis not present

## 2020-05-09 DIAGNOSIS — I130 Hypertensive heart and chronic kidney disease with heart failure and stage 1 through stage 4 chronic kidney disease, or unspecified chronic kidney disease: Secondary | ICD-10-CM | POA: Diagnosis not present

## 2020-05-09 DIAGNOSIS — I4891 Unspecified atrial fibrillation: Secondary | ICD-10-CM | POA: Diagnosis not present

## 2020-05-09 NOTE — Telephone Encounter (Signed)
[name], from [facility], called stating that the pt had a fall on 05/08/20. She states that the pt has a large bruise on her right elbow (3x3) and a small bruise on her left. Please advise.

## 2020-05-09 NOTE — Telephone Encounter (Signed)
Please review. Thanks!  

## 2020-05-10 DIAGNOSIS — N183 Chronic kidney disease, stage 3 unspecified: Secondary | ICD-10-CM | POA: Diagnosis not present

## 2020-05-10 DIAGNOSIS — I130 Hypertensive heart and chronic kidney disease with heart failure and stage 1 through stage 4 chronic kidney disease, or unspecified chronic kidney disease: Secondary | ICD-10-CM | POA: Diagnosis not present

## 2020-05-10 DIAGNOSIS — I4891 Unspecified atrial fibrillation: Secondary | ICD-10-CM | POA: Diagnosis not present

## 2020-05-10 DIAGNOSIS — I2510 Atherosclerotic heart disease of native coronary artery without angina pectoris: Secondary | ICD-10-CM | POA: Diagnosis not present

## 2020-05-10 DIAGNOSIS — I509 Heart failure, unspecified: Secondary | ICD-10-CM | POA: Diagnosis not present

## 2020-05-10 DIAGNOSIS — J449 Chronic obstructive pulmonary disease, unspecified: Secondary | ICD-10-CM | POA: Diagnosis not present

## 2020-05-10 NOTE — Telephone Encounter (Signed)
Called patient's niece Marilyn Oconnell to advise of message and no answer and voicemail was too full to leave a message. If Marilyn Oconnell calls back okay for [facility] to advise and see if she would like patient to have a sooner appointment.

## 2020-05-10 NOTE — Telephone Encounter (Signed)
If they're concerned about fractures, which she has fractured her humerus in the past and it required surgery, then we can see her in office or virtually or if they feel it's very urgent she should be seen in the urgent care.

## 2020-05-11 DIAGNOSIS — N183 Chronic kidney disease, stage 3 unspecified: Secondary | ICD-10-CM | POA: Diagnosis not present

## 2020-05-11 DIAGNOSIS — I509 Heart failure, unspecified: Secondary | ICD-10-CM | POA: Diagnosis not present

## 2020-05-11 DIAGNOSIS — J449 Chronic obstructive pulmonary disease, unspecified: Secondary | ICD-10-CM | POA: Diagnosis not present

## 2020-05-11 DIAGNOSIS — I2510 Atherosclerotic heart disease of native coronary artery without angina pectoris: Secondary | ICD-10-CM | POA: Diagnosis not present

## 2020-05-11 DIAGNOSIS — I4891 Unspecified atrial fibrillation: Secondary | ICD-10-CM | POA: Diagnosis not present

## 2020-05-11 DIAGNOSIS — I130 Hypertensive heart and chronic kidney disease with heart failure and stage 1 through stage 4 chronic kidney disease, or unspecified chronic kidney disease: Secondary | ICD-10-CM | POA: Diagnosis not present

## 2020-05-11 NOTE — Telephone Encounter (Signed)
Attempted to reach pts niece Sharyn Lull to advise of message, VM full, unable to leave message.

## 2020-05-16 NOTE — Telephone Encounter (Signed)
Spoke with patient's niece Sharyn Lull) and she states that patient is doing fine. Sharyn Lull states that patient is up walking around and using her arms just fine without any pain. Sharyn Lull states that she had a NP that works with her come check the patient out and she thinks it may have just been a bruised elbow. Patient will be in the office on 05/22/2020 to see Adriana. Just a [place]

## 2020-05-17 DIAGNOSIS — J449 Chronic obstructive pulmonary disease, unspecified: Secondary | ICD-10-CM | POA: Diagnosis not present

## 2020-05-17 DIAGNOSIS — I4891 Unspecified atrial fibrillation: Secondary | ICD-10-CM | POA: Diagnosis not present

## 2020-05-17 DIAGNOSIS — I509 Heart failure, unspecified: Secondary | ICD-10-CM | POA: Diagnosis not present

## 2020-05-17 DIAGNOSIS — I2510 Atherosclerotic heart disease of native coronary artery without angina pectoris: Secondary | ICD-10-CM | POA: Diagnosis not present

## 2020-05-17 DIAGNOSIS — N183 Chronic kidney disease, stage 3 unspecified: Secondary | ICD-10-CM | POA: Diagnosis not present

## 2020-05-17 DIAGNOSIS — I130 Hypertensive heart and chronic kidney disease with heart failure and stage 1 through stage 4 chronic kidney disease, or unspecified chronic kidney disease: Secondary | ICD-10-CM | POA: Diagnosis not present

## 2020-05-18 DIAGNOSIS — H548 Legal blindness, as defined in USA: Secondary | ICD-10-CM | POA: Diagnosis not present

## 2020-05-18 DIAGNOSIS — J449 Chronic obstructive pulmonary disease, unspecified: Secondary | ICD-10-CM | POA: Diagnosis not present

## 2020-05-18 DIAGNOSIS — H9190 Unspecified hearing loss, unspecified ear: Secondary | ICD-10-CM | POA: Diagnosis not present

## 2020-05-18 DIAGNOSIS — D333 Benign neoplasm of cranial nerves: Secondary | ICD-10-CM | POA: Diagnosis not present

## 2020-05-18 DIAGNOSIS — Z7982 Long term (current) use of aspirin: Secondary | ICD-10-CM | POA: Diagnosis not present

## 2020-05-18 DIAGNOSIS — I130 Hypertensive heart and chronic kidney disease with heart failure and stage 1 through stage 4 chronic kidney disease, or unspecified chronic kidney disease: Secondary | ICD-10-CM | POA: Diagnosis not present

## 2020-05-18 DIAGNOSIS — J8410 Pulmonary fibrosis, unspecified: Secondary | ICD-10-CM | POA: Diagnosis not present

## 2020-05-18 DIAGNOSIS — I482 Chronic atrial fibrillation, unspecified: Secondary | ICD-10-CM | POA: Diagnosis not present

## 2020-05-18 DIAGNOSIS — Z7952 Long term (current) use of systemic steroids: Secondary | ICD-10-CM | POA: Diagnosis not present

## 2020-05-18 DIAGNOSIS — I4891 Unspecified atrial fibrillation: Secondary | ICD-10-CM | POA: Diagnosis not present

## 2020-05-18 DIAGNOSIS — M1990 Unspecified osteoarthritis, unspecified site: Secondary | ICD-10-CM | POA: Diagnosis not present

## 2020-05-18 DIAGNOSIS — F331 Major depressive disorder, recurrent, moderate: Secondary | ICD-10-CM | POA: Diagnosis not present

## 2020-05-18 DIAGNOSIS — I509 Heart failure, unspecified: Secondary | ICD-10-CM | POA: Diagnosis not present

## 2020-05-18 DIAGNOSIS — E785 Hyperlipidemia, unspecified: Secondary | ICD-10-CM | POA: Diagnosis not present

## 2020-05-18 DIAGNOSIS — I2510 Atherosclerotic heart disease of native coronary artery without angina pectoris: Secondary | ICD-10-CM | POA: Diagnosis not present

## 2020-05-18 DIAGNOSIS — M5481 Occipital neuralgia: Secondary | ICD-10-CM | POA: Diagnosis not present

## 2020-05-18 DIAGNOSIS — N183 Chronic kidney disease, stage 3 unspecified: Secondary | ICD-10-CM | POA: Diagnosis not present

## 2020-05-18 DIAGNOSIS — G894 Chronic pain syndrome: Secondary | ICD-10-CM | POA: Diagnosis not present

## 2020-05-18 DIAGNOSIS — R410 Disorientation, unspecified: Secondary | ICD-10-CM | POA: Diagnosis not present

## 2020-05-18 DIAGNOSIS — M1030 Gout due to renal impairment, unspecified site: Secondary | ICD-10-CM | POA: Diagnosis not present

## 2020-05-18 DIAGNOSIS — R441 Visual hallucinations: Secondary | ICD-10-CM | POA: Diagnosis not present

## 2020-05-19 DIAGNOSIS — I4891 Unspecified atrial fibrillation: Secondary | ICD-10-CM | POA: Diagnosis not present

## 2020-05-19 DIAGNOSIS — N183 Chronic kidney disease, stage 3 unspecified: Secondary | ICD-10-CM | POA: Diagnosis not present

## 2020-05-19 DIAGNOSIS — J449 Chronic obstructive pulmonary disease, unspecified: Secondary | ICD-10-CM | POA: Diagnosis not present

## 2020-05-19 DIAGNOSIS — I509 Heart failure, unspecified: Secondary | ICD-10-CM | POA: Diagnosis not present

## 2020-05-19 DIAGNOSIS — I130 Hypertensive heart and chronic kidney disease with heart failure and stage 1 through stage 4 chronic kidney disease, or unspecified chronic kidney disease: Secondary | ICD-10-CM | POA: Diagnosis not present

## 2020-05-19 DIAGNOSIS — I2510 Atherosclerotic heart disease of native coronary artery without angina pectoris: Secondary | ICD-10-CM | POA: Diagnosis not present

## 2020-05-19 NOTE — Progress Notes (Deleted)
{This patient's chart is due for periodic physician review. Please check 'Cosign Required' and forward to your supervising physician.:1}  Established patient visit   Patient: Marilyn Oconnell   DOB: 1923/09/29   86 y.o. Female  MRN: [id number] Visit Date: 05/22/2020  Today's healthcare provider: Trinna Post, PA-C   No chief complaint on file.  Subjective    HPI  Hypertension, follow-up  BP Readings from Last 3 Encounters:  04/21/20 (!) 171/70  04/05/20 (!) 170/68  03/27/20 (!) 159/75   Wt Readings from Last 3 Encounters:  04/05/20 110 lb (49.9 kg)  03/24/20 110 lb 9.6 oz (50.2 kg)  03/16/20 124 lb (56.2 kg)     She was last seen for hypertension 1 months ago.  BP at that visit was 171/70. Management since that visit includes Losartan was added and patient was advised to avoid Amlodipine and diuretics due to severe aortic stenosis  .  She reports {excellent/good/fair/poor:19665} compliance with treatment. She {is/is not:9024} having side effects. {document side effects if present:1} She is following a {diet:21022986} diet. She {is/is not:9024} exercising. She {does/does not:200015} smoke.  Use of agents associated with hypertension: {bp agents assoc with hypertension:511::"none"}.   Outside blood pressures are {***enter patient reported home BP readings, or 'not being checked':1}. Symptoms: {Yes/No:20286} chest pain {Yes/No:20286} chest pressure  {Yes/No:20286} palpitations {Yes/No:20286} syncope  {Yes/No:20286} dyspnea {Yes/No:20286} orthopnea  {Yes/No:20286} paroxysmal nocturnal dyspnea {Yes/No:20286} lower extremity edema   Pertinent labs: Lab Results  Component Value Date   CHOL 166 07/14/2018   HDL 62 07/14/2018   LDLCALC 86 07/14/2018   TRIG 88 07/14/2018   CHOLHDL 2.7 07/14/2018   Lab Results  Component Value Date   NA 136 03/27/2020   K 3.9 03/27/2020   CREATININE 1.47 (H) 03/27/2020   GFRNONAA 30 (L) 03/27/2020   GFRAA 35 (L) 03/27/2020    GLUCOSE 102 (H) 03/27/2020     The ASCVD Risk score Mikey Bussing DC Jr., et al., 2013) failed to calculate for the following reasons:   The 2013 ASCVD risk score is only valid for ages 55 to 73   ---------------------------------------------------------------------------------------------------   {Show patient history (optional):23778::" "}   Medications: Outpatient Medications Prior to Visit  Medication Sig  . acetaminophen (TYLENOL) 500 MG tablet Take 500-1,000 mg by mouth 2 (two) times daily as needed for mild pain, moderate pain, fever or headache.   . albuterol (PROVENTIL) (2.5 MG/3ML) 0.083% nebulizer solution Take 2.5 mg by nebulization every 8 (eight) hours as needed for wheezing or shortness of breath.   . allopurinol (ZYLOPRIM) 100 MG tablet Take 100 mg by mouth every evening.   Marland Kitchen aspirin EC 81 MG tablet Take 81 mg by mouth daily. (Patient not taking: Reported on 04/21/2020)  . budesonide (PULMICORT) 0.5 MG/2ML nebulizer solution Take 0.5 mg by nebulization at bedtime.  . diclofenac Sodium (VOLTAREN) 1 % GEL Apply topically.  Marland Kitchen escitalopram (LEXAPRO) 20 MG tablet TAKE 1 TABLET BY MOUTH  DAILY (Patient taking differently: Take 20 mg by mouth daily. )  . Fluticasone-Salmeterol (ADVAIR DISKUS) 250-50 MCG/DOSE AEPB USE ONE (1) INHALATION BY MOUTH EVERY 12 HOURS. RINSE MOUTH OUT AFTERUSE.  . furosemide (LASIX) 20 MG tablet TAKE 1 TABLET BY MOUTH  DAILY (Patient taking differently: Take 20 mg by mouth daily. )  . hydrALAZINE (APRESOLINE) 10 MG tablet Take 1 tablet (10 mg total) by mouth 2 (two) times daily.  Marland Kitchen ipratropium-albuterol (DUONEB) 0.5-2.5 (3) MG/3ML SOLN Take 3 mLs by nebulization 3 (three) times daily.  Marland Kitchen  losartan (COZAAR) 25 MG tablet Take 1 tablet (25 mg total) by mouth daily.  . metoprolol succinate (TOPROL-XL) 50 MG 24 hr tablet Take 1.5 tablets (75 mg total) by mouth daily. Take with or immediately following a meal.  . simvastatin (ZOCOR) 20 MG tablet TAKE 1 TABLET BY MOUTH AT   BEDTIME (Patient not taking: Reported on 04/21/2020)  . traZODone (DESYREL) 50 MG tablet TAKE ONE-HALF TABLET BY  MOUTH AT BEDTIME (Patient not taking: Reported on 04/21/2020)   No facility-administered medications prior to visit.    Review of Systems  {Heme  Chem  Endocrine  Serology  Results Review (optional):23779::" "}  Objective    There were no vitals taken for this visit. {Show previous vital signs (optional):23777::" "}  Physical Exam  ***  No results found for any visits on 05/22/20.  Assessment & Plan     ***  No follow-ups on file.      {provider attestation***:1}   Paulene Floor  [facility] [phone] (phone) [phone] (fax)  [place]

## 2020-05-22 ENCOUNTER — Ambulatory Visit: Payer: Self-pay | Admitting: Physician Assistant

## 2020-05-23 DIAGNOSIS — I4891 Unspecified atrial fibrillation: Secondary | ICD-10-CM | POA: Diagnosis not present

## 2020-05-23 DIAGNOSIS — I2510 Atherosclerotic heart disease of native coronary artery without angina pectoris: Secondary | ICD-10-CM | POA: Diagnosis not present

## 2020-05-23 DIAGNOSIS — N183 Chronic kidney disease, stage 3 unspecified: Secondary | ICD-10-CM | POA: Diagnosis not present

## 2020-05-23 DIAGNOSIS — I130 Hypertensive heart and chronic kidney disease with heart failure and stage 1 through stage 4 chronic kidney disease, or unspecified chronic kidney disease: Secondary | ICD-10-CM | POA: Diagnosis not present

## 2020-05-23 DIAGNOSIS — J449 Chronic obstructive pulmonary disease, unspecified: Secondary | ICD-10-CM | POA: Diagnosis not present

## 2020-05-23 DIAGNOSIS — I509 Heart failure, unspecified: Secondary | ICD-10-CM | POA: Diagnosis not present

## 2020-05-24 ENCOUNTER — Ambulatory Visit: Payer: Self-pay | Admitting: *Deleted

## 2020-05-24 DIAGNOSIS — I130 Hypertensive heart and chronic kidney disease with heart failure and stage 1 through stage 4 chronic kidney disease, or unspecified chronic kidney disease: Secondary | ICD-10-CM | POA: Diagnosis not present

## 2020-05-24 DIAGNOSIS — I2510 Atherosclerotic heart disease of native coronary artery without angina pectoris: Secondary | ICD-10-CM | POA: Diagnosis not present

## 2020-05-24 DIAGNOSIS — N183 Chronic kidney disease, stage 3 unspecified: Secondary | ICD-10-CM | POA: Diagnosis not present

## 2020-05-24 DIAGNOSIS — J449 Chronic obstructive pulmonary disease, unspecified: Secondary | ICD-10-CM | POA: Diagnosis not present

## 2020-05-24 DIAGNOSIS — I4891 Unspecified atrial fibrillation: Secondary | ICD-10-CM | POA: Diagnosis not present

## 2020-05-24 DIAGNOSIS — I509 Heart failure, unspecified: Secondary | ICD-10-CM | POA: Diagnosis not present

## 2020-05-24 NOTE — Telephone Encounter (Addendum)
SW Higbee Beam calls this morning reporting a high b/p for the patient while she was at her home conducting a visit after hours yesterday. B/p 120/100 taken by the PT who was also present. At that time the patient complained of dizziness.  TC to the patient. Has headache this morning when she turns her head a certain way. Denies dizziness/cp/sob/numbness/tingling. Patient is blind and does not have a talking bp monitor/no monitor at all. Drinking 3-4 glasses water daily. Denies difficulty voiding. Reviewed urgent symptoms and when to call 911.  Oren Section, patient's niece called. She will have PT check patient's bp today. He has a noon appointment. She also reported the patient has occipital neuralgia and has an upcoming appointment for treatment.Appointment made with pcp for 05/25/20.SW and patient aware of appointment.   Answer Assessment - Initial Assessment Questions 1. BLOOD PRESSURE: "What is the blood pressure?" "Did you take at least two measurements 5 minutes apart?"      No way to check b/p at home  2. ONSET: "When did you take your blood pressure?"     3. HOW: "How did you obtain the blood pressure?" (e.g., visiting nurse, automatic home BP monitor)     Physical therapist checked her pressure while on a visit yesterday.  4. HISTORY: "Do you have a history of high blood pressure?"     yes 5. MEDICATIONS: "Are you taking any medications for blood pressure?" "Have you missed any doses recently?"     Taking as prescribed 6. OTHER SYMPTOMS: "Do you have any symptoms?" (e.g., headache, chest pain, blurred vision, difficulty breathing, weakness)     Woke up with a headache this morning when she turns her head a certain way 7. PREGNANCY: "Is there any chance you are pregnant?" "When was your last menstrual period?"     na  Protocols used: BLOOD PRESSURE - HIGH-A-AH

## 2020-05-25 ENCOUNTER — Encounter: Payer: Self-pay | Admitting: Physician Assistant

## 2020-05-25 ENCOUNTER — Ambulatory Visit (INDEPENDENT_AMBULATORY_CARE_PROVIDER_SITE_OTHER): Payer: Medicare Other | Admitting: Physician Assistant

## 2020-05-25 ENCOUNTER — Other Ambulatory Visit: Payer: Self-pay

## 2020-05-25 VITALS — BP 122/52 | HR 77 | Temp 97.9°F | Wt 106.0 lb

## 2020-05-25 DIAGNOSIS — I10 Essential (primary) hypertension: Secondary | ICD-10-CM | POA: Diagnosis not present

## 2020-05-25 MED ORDER — HYDRALAZINE HCL 10 MG PO TABS: 10.0000 mg | ORAL_TABLET | Freq: Two times a day (BID) | ORAL | 0 refills | Status: DC

## 2020-05-25 MED ORDER — LOSARTAN POTASSIUM 25 MG PO TABS: 12.5000 mg | ORAL_TABLET | Freq: Every day | ORAL | 1 refills | Status: DC

## 2020-05-25 NOTE — Patient Instructions (Signed)
CMET to check kidneys

## 2020-05-25 NOTE — Progress Notes (Signed)
Established patient visit   Patient: Marilyn Oconnell   DOB: 24-Dec-1923   86 y.o. Female  MRN: [id number] Visit Date: 05/25/2020  Today's healthcare provider: Trinna Post, PA-C   Chief Complaint  Patient presents with  . Follow-up  I,Donnae Michels M Ameris Akamine,acting as a scribe for Trinna Post, PA-C.,have documented all relevant documentation on the behalf of Trinna Post, PA-C,as directed by  Trinna Post, PA-C while in the presence of Trinna Post, PA-C.  Subjective    HPI  Follow-up Patient physical therapist reported that her bp was 120/100 on 05/23/2020 & 144/66 on 05/24/2020. She has had a complex course regarding her HTN and atrial fibrillation. She had recently been hospitalized for atrial fibrillation with RVR in 03/22/2020 and was treated with diltiazem drip. She was also prescribed hydralazine 10 mg TID on discharge which was eventually changed to 10 mg BID. She is additionally on metoprolol xl 75 mg daily. Last visit her BP was elevated and was consistently elevated such that PT could not provide services.   Hypertension, follow-up  BP Readings from Last 3 Encounters:  05/25/20 (!) 122/52  04/21/20 (!) 171/70  04/05/20 (!) 170/68   Wt Readings from Last 3 Encounters:  05/25/20 106 lb (48.1 kg)  04/05/20 110 lb (49.9 kg)  03/24/20 110 lb 9.6 oz (50.2 kg)     She was last seen for hypertension 1 months ago.  BP at that visit was elevated. Management since that visit includes starting losartan 25 QD.  She reports excellent compliance with treatment. She is having side effects. Dizziness She is following a Regular diet. She is not exercising. She does not smoke.  Use of agents associated with hypertension: none.   Outside blood pressures are elevated at Physical therapy. Symptoms: No chest pain No chest pressure  No palpitations No syncope  No dyspnea No orthopnea  No paroxysmal nocturnal dyspnea No lower extremity edema   Pertinent labs: Lab  Results  Component Value Date   CHOL 166 07/14/2018   HDL 62 07/14/2018   LDLCALC 86 07/14/2018   TRIG 88 07/14/2018   CHOLHDL 2.7 07/14/2018   Lab Results  Component Value Date   NA 136 03/27/2020   K 3.9 03/27/2020   CREATININE 1.47 (H) 03/27/2020   GFRNONAA 30 (L) 03/27/2020   GFRAA 35 (L) 03/27/2020   GLUCOSE 102 (H) 03/27/2020     The ASCVD Risk score Mikey Bussing DC Jr., et al., 2013) failed to calculate for the following reasons:   The 2013 ASCVD risk score is only valid for ages 86 to 59   ---------------------------------------------------------------------------------------------------  Patient reports that she is having dizziness daily when she stands up.      Medications: Outpatient Medications Prior to Visit  Medication Sig  . acetaminophen (TYLENOL) 500 MG tablet Take 500-1,000 mg by mouth 2 (two) times daily as needed for mild pain, moderate pain, fever or headache.   . albuterol (PROVENTIL) (2.5 MG/3ML) 0.083% nebulizer solution Take 2.5 mg by nebulization every 8 (eight) hours as needed for wheezing or shortness of breath.   . allopurinol (ZYLOPRIM) 100 MG tablet Take 100 mg by mouth every evening.   . budesonide (PULMICORT) 0.5 MG/2ML nebulizer solution Take 0.5 mg by nebulization at bedtime.  . diclofenac Sodium (VOLTAREN) 1 % GEL Apply topically.  Marland Kitchen escitalopram (LEXAPRO) 20 MG tablet TAKE 1 TABLET BY MOUTH  DAILY (Patient taking differently: Take 20 mg by mouth daily. )  . Fluticasone-Salmeterol (ADVAIR  DISKUS) 250-50 MCG/DOSE AEPB USE ONE (1) INHALATION BY MOUTH EVERY 12 HOURS. RINSE MOUTH OUT AFTERUSE.  Marland Kitchen ipratropium-albuterol (DUONEB) 0.5-2.5 (3) MG/3ML SOLN Take 3 mLs by nebulization 3 (three) times daily.  . metoprolol succinate (TOPROL-XL) 50 MG 24 hr tablet Take 1.5 tablets (75 mg total) by mouth daily. Take with or immediately following a meal.  . simvastatin (ZOCOR) 20 MG tablet TAKE 1 TABLET BY MOUTH AT  BEDTIME  . traZODone (DESYREL) 50 MG tablet TAKE  ONE-HALF TABLET BY  MOUTH AT BEDTIME  . [DISCONTINUED] hydrALAZINE (APRESOLINE) 10 MG tablet Take 1 tablet (10 mg total) by mouth 2 (two) times daily.  . [DISCONTINUED] losartan (COZAAR) 25 MG tablet Take 1 tablet (25 mg total) by mouth daily.  . furosemide (LASIX) 20 MG tablet TAKE 1 TABLET BY MOUTH  DAILY (Patient taking differently: Take 20 mg by mouth daily. )  . [DISCONTINUED] aspirin EC 81 MG tablet Take 81 mg by mouth daily. (Patient not taking: Reported on 04/21/2020)   No facility-administered medications prior to visit.    Review of Systems  Constitutional: Negative.   Cardiovascular: Negative.   Musculoskeletal: Negative.   Neurological: Positive for dizziness and headaches.      Objective    BP (!) 122/52 (BP Location: Right Arm, Patient Position: Sitting, Cuff Size: Normal)   Pulse 77   Temp 97.9 F (36.6 C) (Oral)   Wt 106 lb (48.1 kg)   SpO2 95%   BMI 20.03 kg/m    Physical Exam Constitutional:      Appearance: Normal appearance.  Cardiovascular:     Rate and Rhythm: Normal rate and regular rhythm.     Heart sounds: Normal heart sounds.  Pulmonary:     Effort: Pulmonary effort is normal.     Breath sounds: Normal breath sounds.  Musculoskeletal:     Comments: Some bruising on right elbow but otherwise normal.   Skin:    General: Skin is warm and dry.  Neurological:     General: No focal deficit present.     Mental Status: She is alert and oriented to person, place, and time. Mental status is at baseline.  Psychiatric:        Mood and Affect: Mood normal.        Behavior: Behavior normal.       No results found for any visits on 05/25/20.  Assessment & Plan    1. Essential hypertension  Blood pressure is a little soft today, concerned about falls. We will decrease her losartan from 25 mg daily to 12.5 mg daily. CMET as below. She has follow up with Dr. Ubaldo Glassing in one month.   - Comprehensive Metabolic Panel (CMET) - losartan (COZAAR) 25 MG tablet;  Take 0.5 tablets (12.5 mg total) by mouth daily.  Dispense: 45 tablet; Refill: 1 - hydrALAZINE (APRESOLINE) 10 MG tablet; Take 1 tablet (10 mg total) by mouth 2 (two) times daily.  Dispense: 180 tablet; Refill: 0    Return in about 4 weeks (around 06/22/2020) for HTN with cardiology .      ITrinna Post, PA-C, have reviewed all documentation for this visit. The documentation on 05/25/20 for the exam, diagnosis, procedures, and orders are all accurate and complete.  The entirety of the information documented in the History of Present Illness, Review of Systems and Physical Exam were personally obtained by me. Portions of this information were initially documented by [facility] and reviewed by me for thoroughness and accuracy.  Paulene Floor  [facility] [phone] (phone) [phone] (fax)  [place]

## 2020-05-30 ENCOUNTER — Telehealth: Payer: Self-pay

## 2020-05-30 NOTE — Telephone Encounter (Signed)
Copied from [place] [id number]. Topic: General - Call Back - No Documentation >> May 30, 2020 11:13 AM Erick Blinks wrote: In order to bill the patient's medicare, we need to fax office notes from last visit in the office. Both visits from September and July, she needs all office notes with an NP or MD signature and date.   Fax: [place]

## 2020-05-31 NOTE — Telephone Encounter (Signed)
Please review. Thanks!  

## 2020-06-01 DIAGNOSIS — N183 Chronic kidney disease, stage 3 unspecified: Secondary | ICD-10-CM | POA: Diagnosis not present

## 2020-06-01 DIAGNOSIS — J449 Chronic obstructive pulmonary disease, unspecified: Secondary | ICD-10-CM | POA: Diagnosis not present

## 2020-06-01 DIAGNOSIS — I4891 Unspecified atrial fibrillation: Secondary | ICD-10-CM | POA: Diagnosis not present

## 2020-06-01 DIAGNOSIS — I130 Hypertensive heart and chronic kidney disease with heart failure and stage 1 through stage 4 chronic kidney disease, or unspecified chronic kidney disease: Secondary | ICD-10-CM | POA: Diagnosis not present

## 2020-06-01 DIAGNOSIS — I509 Heart failure, unspecified: Secondary | ICD-10-CM | POA: Diagnosis not present

## 2020-06-01 DIAGNOSIS — I2510 Atherosclerotic heart disease of native coronary artery without angina pectoris: Secondary | ICD-10-CM | POA: Diagnosis not present

## 2020-06-01 NOTE — Telephone Encounter (Signed)
Yes it's fine to start with those. I haven't signed her most recent note, I'll try and finish it today.

## 2020-06-01 NOTE — Telephone Encounter (Signed)
Ok to send office notes ? 

## 2020-06-01 NOTE — Telephone Encounter (Signed)
July and September office notes was printed and faxed to Marilyn Oconnell. Anderson Malta was also advised that office notes was faxed as well.

## 2020-06-01 NOTE — Telephone Encounter (Signed)
Caller name:jennifer anderson  Relation to pt: [facility]   Call back number:  Phone [phone] and fax # [phone]    Reason for call:  Checking on status of request mentioned below.

## 2020-06-06 ENCOUNTER — Telehealth: Payer: Self-pay

## 2020-06-06 NOTE — Telephone Encounter (Signed)
OK to send CMET.

## 2020-06-06 NOTE — Telephone Encounter (Signed)
Copied from [place] [id number]. Topic: General - Other >> Jun 06, 2020 10:09 AM Leward Quan A wrote: Reason for CRM: [name] with [facility] called to ask Carles Collet send orders to the office for the blood work that was requested at patients last visit. Please send lab orders to Fax# [phone]  Ph# [phone]

## 2020-06-07 ENCOUNTER — Ambulatory Visit: Admission: RE | Admit: 2020-06-07 | Payer: Medicare Other | Source: Ambulatory Visit

## 2020-06-07 ENCOUNTER — Other Ambulatory Visit: Payer: Self-pay

## 2020-06-07 ENCOUNTER — Encounter: Payer: Self-pay | Admitting: Student in an Organized Health Care Education/Training Program

## 2020-06-07 ENCOUNTER — Ambulatory Visit (HOSPITAL_BASED_OUTPATIENT_CLINIC_OR_DEPARTMENT_OTHER): Payer: Medicare Other | Admitting: Student in an Organized Health Care Education/Training Program

## 2020-06-07 ENCOUNTER — Other Ambulatory Visit
Admission: RE | Admit: 2020-06-07 | Discharge: 2020-06-07 | Disposition: A | Discharge status: Home / Self Care | Payer: Medicare Other | Source: Ambulatory Visit | Attending: Physician Assistant | Admitting: Physician Assistant

## 2020-06-07 VITALS — BP 161/62 | HR 62 | Temp 98.7°F | Resp 18 | Ht 61.0 in | Wt 106.0 lb

## 2020-06-07 DIAGNOSIS — G8929 Other chronic pain: Secondary | ICD-10-CM | POA: Diagnosis not present

## 2020-06-07 DIAGNOSIS — M5481 Occipital neuralgia: Secondary | ICD-10-CM | POA: Diagnosis not present

## 2020-06-07 DIAGNOSIS — Z952 Presence of prosthetic heart valve: Secondary | ICD-10-CM | POA: Insufficient documentation

## 2020-06-07 DIAGNOSIS — G894 Chronic pain syndrome: Secondary | ICD-10-CM | POA: Insufficient documentation

## 2020-06-07 DIAGNOSIS — R519 Headache, unspecified: Secondary | ICD-10-CM

## 2020-06-07 LAB — COMPREHENSIVE METABOLIC PANEL
ALT: 10 U/L (ref 0–44)
AST: 17 U/L (ref 15–41)
Albumin: 3.9 g/dL (ref 3.5–5.0)
Alkaline Phosphatase: 56 U/L (ref 38–126)
Anion gap: 12 (ref 5–15)
BUN: 29 mg/dL — ABNORMAL HIGH (ref 8–23)
CO2: 28 mmol/L (ref 22–32)
Calcium: 9.4 mg/dL (ref 8.9–10.3)
Chloride: 100 mmol/L (ref 98–111)
Creatinine, Ser: 1.59 mg/dL — ABNORMAL HIGH (ref 0.44–1.00)
GFR calc Af Amer: 32 mL/min — ABNORMAL LOW (ref >60)
GFR calc non Af Amer: 28 mL/min — ABNORMAL LOW (ref >60)
Glucose, Bld: 103 mg/dL — ABNORMAL HIGH (ref 70–99)
Potassium: 4.3 mmol/L (ref 3.5–5.1)
Sodium: 140 mmol/L (ref 135–145)
Total Bilirubin: 0.6 mg/dL (ref 0.3–1.2)
Total Protein: 7 g/dL (ref 6.5–8.1)

## 2020-06-07 MED ORDER — ROPIVACAINE HCL 2 MG/ML IJ SOLN: 2.0000 mL | Freq: Once | INTRAMUSCULAR | Status: DC

## 2020-06-07 MED ORDER — LIDOCAINE HCL 2 % IJ SOLN: 20.0000 mL | Freq: Once | INTRAMUSCULAR | Status: DC

## 2020-06-07 MED ORDER — LIDOCAINE HCL 2 % IJ SOLN
INTRAMUSCULAR | Status: AC
  Filled 2020-06-07: qty 20

## 2020-06-07 MED ORDER — DEXAMETHASONE SODIUM PHOSPHATE 10 MG/ML IJ SOLN
INTRAMUSCULAR | Status: AC
  Filled 2020-06-07: qty 1

## 2020-06-07 MED ORDER — METHYLPREDNISOLONE ACETATE 40 MG/ML IJ SUSP: 40.0000 mg | Freq: Once | INTRAMUSCULAR | Status: DC

## 2020-06-07 MED ORDER — DEXAMETHASONE SODIUM PHOSPHATE 10 MG/ML IJ SOLN: 10.0000 mg | Freq: Once | INTRAMUSCULAR | Status: DC

## 2020-06-07 MED ORDER — ROPIVACAINE HCL 2 MG/ML IJ SOLN
INTRAMUSCULAR | Status: AC
  Filled 2020-06-07: qty 10

## 2020-06-07 NOTE — Progress Notes (Signed)
Safety precautions to be maintained throughout the outpatient stay will include: orient to surroundings, keep bed in low position, maintain call bell within reach at all times, provide assistance with transfer out of bed and ambulation.  

## 2020-06-07 NOTE — Progress Notes (Signed)
PROVIDER NOTE: Information contained herein reflects review and annotations entered in association with encounter. Interpretation of such information and data should be left to medically-trained personnel. Information provided to patient can be located elsewhere in the medical record under "Patient Instructions". Document created using STT-dictation technology, any transcriptional errors that may result from process are unintentional.    Patient: Marilyn Oconnell  Service Category: Procedure  Provider: Gillis Santa, MD  DOB: 1924-07-16  DOS: 06/07/2020  Location: [facility]  MRN: [id number]  Setting: Ambulatory - outpatient  Referring Provider: Trinna Post, PA-C  Type: Established Patient  Specialty: Interventional Pain Management  PCP: Trinna Post, PA-C   Primary Reason for Visit: Interventional Pain Management Treatment. CC: Headache  Procedure:          Anesthesia, Analgesia, Anxiolysis:  Type: Therapeutic, Greater, Occipital Nerve Block  #2  Region: Posterolateral Cervical Level: Occipital Ridge   Laterality: Left-Sided  Type: Local Anesthesia  Local Anesthetic: Lidocaine 1-2%  Position: Prone   Indications: 1. Occipital neuralgia of left side   2. Chronic left-sided headaches   3. Chronic pain syndrome    Pain Score: Pre-procedure: 4 /10 Post-procedure: 0-No pain/10   Pre-op Assessment:  Marilyn Oconnell is a 86 y.o. (year old), female patient, seen today for interventional treatment. She  has a past surgical history that includes Shoulder surgery (09/1999); Sigmoidoscopy (1992); Dilation and curettage of uterus (1988); and Aortic valve replacement (2006). Marilyn Oconnell has a current medication list which includes the following prescription(s): acetaminophen, albuterol, allopurinol, budesonide, diclofenac sodium, escitalopram, fluticasone-salmeterol, furosemide, hydralazine, ipratropium-albuterol, losartan, metoprolol succinate, simvastatin, and trazodone, and the  following Facility-Administered Medications: dexamethasone, lidocaine, and ropivacaine (pf) 2 mg/ml (0.2%). Her primarily concern today is the Headache  Initial Vital Signs:  Pulse/HCG Rate: 61  Temp: 98.7 F (37.1 C) Resp: 16 BP: (!) 132/57 SpO2: 99 %  BMI: Estimated body mass index is 20.03 kg/m as calculated from the following:   Height as of this encounter: 5\' 1"  (1.549 m).   Weight as of this encounter: 106 lb (48.1 kg).  Risk Assessment: Allergies: Reviewed. She is allergic to iodine, shellfish allergy, azithromycin, and sulfa antibiotics.  Allergy Precautions: None required Coagulopathies: Reviewed. None identified.  Blood-thinner therapy: None at this time Active Infection(s): Reviewed. None identified. Marilyn Oconnell is afebrile  Site Confirmation: Marilyn Oconnell was asked to confirm the procedure and laterality before marking the site Procedure checklist: Completed Consent: Before the procedure and under the influence of no sedative(s), amnesic(s), or anxiolytics, the patient was informed of the treatment options, risks and possible complications. To fulfill our ethical and legal obligations, as recommended by the American Medical Association's Code of Ethics, I have informed the patient of my clinical impression; the nature and purpose of the treatment or procedure; the risks, benefits, and possible complications of the intervention; the alternatives, including doing nothing; the risk(s) and benefit(s) of the alternative treatment(s) or procedure(s); and the risk(s) and benefit(s) of doing nothing. The patient was provided information about the general risks and possible complications associated with the procedure. These may include, but are not limited to: failure to achieve desired goals, infection, bleeding, organ or nerve damage, allergic reactions, paralysis, and death. In addition, the patient was informed of those risks and complications associated to the procedure, such as  failure to decrease pain; infection; bleeding; organ or nerve damage with subsequent damage to sensory, motor, and/or autonomic systems, resulting in permanent pain, numbness, and/or weakness of one or several areas  of the body; allergic reactions; (i.e.: anaphylactic reaction); and/or death. Furthermore, the patient was informed of those risks and complications associated with the medications. These include, but are not limited to: allergic reactions (i.e.: anaphylactic or anaphylactoid reaction(s)); adrenal axis suppression; blood sugar elevation that in diabetics may result in ketoacidosis or comma; water retention that in patients with history of congestive heart failure may result in shortness of breath, pulmonary edema, and decompensation with resultant heart failure; weight gain; swelling or edema; medication-induced neural toxicity; particulate matter embolism and blood vessel occlusion with resultant organ, and/or nervous system infarction; and/or aseptic necrosis of one or more joints. Finally, the patient was informed that Medicine is not an exact science; therefore, there is also the possibility of unforeseen or unpredictable risks and/or possible complications that may result in a catastrophic outcome. The patient indicated having understood very clearly. We have given the patient no guarantees and we have made no promises. Enough time was given to the patient to ask questions, all of which were answered to the patient's satisfaction. Marilyn Oconnell has indicated that she wanted to continue with the procedure. Attestation: I, the ordering provider, attest that I have discussed with the patient the benefits, risks, side-effects, alternatives, likelihood of achieving goals, and potential problems during recovery for the procedure that I have provided informed consent. Date  Time: 06/07/2020 11:08 AM  Pre-Procedure Preparation:  Monitoring: As per clinic protocol. Respiration, ETCO2, SpO2, BP, heart  rate and rhythm monitor placed and checked for adequate function Safety Precautions: Patient was assessed for positional comfort and pressure points before starting the procedure. Time-out: I initiated and conducted the "Time-out" before starting the procedure, as per protocol. The patient was asked to participate by confirming the accuracy of the "Time Out" information. Verification of the correct person, site, and procedure were performed and confirmed by me, the nursing staff, and the patient. "Time-out" conducted as per Joint Commission's Universal Protocol (UP.01.01.01). Time: 1210  Description of Procedure:          Target Area: Area medial to the occipital artery at the level of the superior nuchal ridge Approach: Posterior approach Area Prepped: Entire Posterior Occipital Region DuraPrep (Iodine Povacrylex [0.7% available iodine] and Isopropyl Alcohol, 74% w/w) Safety Precautions: Aspiration looking for blood return was conducted prior to all injections. At no point did we inject any substances, as a needle was being advanced. No attempts were made at seeking any paresthesias. Safe injection practices and needle disposal techniques used. Medications properly checked for expiration dates. SDV (single dose vial) medications used. Description of the Procedure: Protocol guidelines were followed. The target area was identified and the area prepped in the usual manner. Skin & deeper tissues infiltrated with local anesthetic. Appropriate amount of time allowed to pass for local anesthetics to take effect. The procedure needles were then advanced to the target area. Proper needle placement secured. Negative aspiration confirmed. Solution injected in intermittent fashion, asking for systemic symptoms every 0.5cc of injectate. The needles were then removed and the area cleansed, making sure to leave some of the prepping solution back to take advantage of its long term bactericidal properties.  Vitals:    06/07/20 1114 06/07/20 1205 06/07/20 1216  BP: (!) 132/57 (!) 169/61 (!) 161/62  Pulse: 61 62   Resp: 16 16 18   Temp: 98.7 F (37.1 C)    TempSrc: Oral    SpO2: 99% 100% 100%  Weight: 106 lb (48.1 kg)    Height: 5\' 1"  (1.549 m)  Start Time: 1210 hrs. End Time: 1213 hrs. Materials:  Needle(s) Type: Regular needle Gauge: 25G Length: 1.5-in Medication(s): Please see orders for medications and dosing details. 4 cc solution made of 3 cc of 2% lidocaine, 1 cc of decadron, 10 mg/cc.  This was injected for the left greater occipital nerve.  Post-operative Assessment:  Post-procedure Vital Signs:  Pulse/HCG Rate: 62  Temp: 98.7 F (37.1 C) Resp: 18 BP: (!) 161/62 SpO2: 100 %  EBL: None  Complications: No immediate post-treatment complications observed by team, or reported by patient.  Note: The patient tolerated the entire procedure well. A repeat set of vitals were taken after the procedure and the patient was kept under observation following institutional policy, for this type of procedure. Post-procedural neurological assessment was performed, showing return to baseline, prior to discharge. The patient was provided with post-procedure discharge instructions, including a section on how to identify potential problems. Should any problems arise concerning this procedure, the patient was given instructions to immediately contact us, at any time, without hesitation. In any case, we plan to contact the patient by telephone for a follow-up status report regarding this interventional procedure.  Comments:  No additional relevant information.  Plan of Care   Medications ordered for procedure: Meds ordered this encounter  Medications  . lidocaine (XYLOCAINE) 2 % (with pres) injection 400 mg  . ropivacaine (PF) 2 mg/mL (0.2%) (NAROPIN) injection 2 mL  . DISCONTD: methylPREDNISolone acetate (DEPO-MEDROL) injection 40 mg  . dexamethasone (DECADRON) injection 10 mg   Medications  administered: Dennison Nancy. Oconnell had no medications administered during this visit.  See the medical record for exact dosing, route, and time of administration.  Follow-up plan:   Return in about 8 weeks (around 08/02/2020) for Post Procedure Evaluation, virtual.    Recent Visits Date Type Provider Dept  05/08/20 Telemedicine Gillis Santa, MD [facility]  04/05/20 Procedure visit Gillis Santa, MD [facility]  03/16/20 Office Visit Gillis Santa, MD [facility]  Showing recent visits within past 90 days and meeting all other requirements Today's Visits Date Type Provider Dept  06/07/20 Procedure visit Gillis Santa, MD [facility]  Showing today's visits and meeting all other requirements Future Appointments Date Type Provider Dept  08/02/20 Appointment Gillis Santa, MD [facility]  Showing future appointments within next 90 days and meeting all other requirements  Disposition: Discharge home  Discharge (Date  Time): 06/07/2020; 1219 hrs.   Primary Care Physician: Paulene Floor Location: [facility] Note by: Gillis Santa, MD Date: 06/07/2020; Time: 1:18 PM  Disclaimer:  Medicine is not an exact science. The only guarantee in medicine is that nothing is guaranteed. It is important to note that the decision to proceed with this intervention was based on the information collected from the patient. The Data and conclusions were drawn from the patient's questionnaire, the interview, and the physical examination. Because the information was provided in large part by the patient, it cannot be guaranteed that it has not been purposely or unconsciously manipulated. Every effort has been made to obtain as much relevant data as possible for this evaluation. It is important to note that the conclusions that lead to this procedure are derived in large part from the available data. Always take into account that the  treatment will also be dependent on availability of resources and existing treatment guidelines, considered by other Pain Management Practitioners as being common knowledge and practice, at the time of  the intervention. For Medico-Legal purposes, it is also important to point out that variation in procedural techniques and pharmacological choices are the acceptable norm. The indications, contraindications, technique, and results of the above procedure should only be interpreted and judged by a Board-Certified Interventional Pain Specialist with extensive familiarity and expertise in the same exact procedure and technique.

## 2020-06-07 NOTE — Patient Instructions (Signed)

## 2020-06-07 NOTE — Telephone Encounter (Signed)
Done.cbe

## 2020-06-08 DIAGNOSIS — W19XXXA Unspecified fall, initial encounter: Secondary | ICD-10-CM | POA: Diagnosis not present

## 2020-06-08 DIAGNOSIS — I10 Essential (primary) hypertension: Secondary | ICD-10-CM | POA: Diagnosis not present

## 2020-06-21 ENCOUNTER — Other Ambulatory Visit: Payer: Self-pay | Admitting: Physician Assistant

## 2020-06-21 NOTE — Telephone Encounter (Signed)
Requested medication (s) are due for refill today: no  Requested medication (s) are on the active medication list:yes  Last refill:  05/01/2020  Future visit scheduled : no  Notes to clinic: medication filled by a different provider    Requested Prescriptions  Pending Prescriptions Disp Refills   metoprolol succinate (TOPROL-XL) 50 MG 24 hr tablet [Pharmacy Med Name: Metoprolol Succinate ER 50 MG Oral Tablet Extended Release 24 Hour] 90 tablet 3    Sig: TAKE 1 TABLET BY MOUTH  DAILY WITH OR IMMEDIATELY  FOLLOWING A MEAL      Cardiovascular:  Beta Blockers Failed - 06/21/2020 10:31 PM      Failed - Last BP in normal range    BP Readings from Last 1 Encounters:  06/07/20 (!) 161/62          Passed - Last Heart Rate in normal range    Pulse Readings from Last 1 Encounters:  06/07/20 62          Passed - Valid encounter within last 6 months    Recent Outpatient Visits           3 weeks ago Essential hypertension   McAlester, Adriana M, PA-C   2 months ago Atrial fibrillation with [facility])   [place], [place], [place]   4 months ago Chronic nonintractable headache, unspecified headache type   [place]ical, [place], PA-C   10 months ago Syncope, unspecified syncope type   [facility] [place], [place], [place]   1 year ago [place], [place], [place]       Future Appointments             In 1 month Gillis Santa, MD [place]

## 2020-06-29 ENCOUNTER — Other Ambulatory Visit: Payer: Self-pay

## 2020-06-29 ENCOUNTER — Emergency Department: Payer: Medicare Other

## 2020-06-29 ENCOUNTER — Encounter: Payer: Self-pay | Admitting: Emergency Medicine

## 2020-06-29 DIAGNOSIS — I130 Hypertensive heart and chronic kidney disease with heart failure and stage 1 through stage 4 chronic kidney disease, or unspecified chronic kidney disease: Secondary | ICD-10-CM | POA: Diagnosis not present

## 2020-06-29 DIAGNOSIS — M19041 Primary osteoarthritis, right hand: Secondary | ICD-10-CM | POA: Diagnosis not present

## 2020-06-29 DIAGNOSIS — Z8042 Family history of malignant neoplasm of prostate: Secondary | ICD-10-CM

## 2020-06-29 DIAGNOSIS — Z20822 Contact with and (suspected) exposure to covid-19: Secondary | ICD-10-CM | POA: Diagnosis not present

## 2020-06-29 DIAGNOSIS — I2510 Atherosclerotic heart disease of native coronary artery without angina pectoris: Secondary | ICD-10-CM | POA: Diagnosis present

## 2020-06-29 DIAGNOSIS — Z881 Allergy status to other antibiotic agents status: Secondary | ICD-10-CM

## 2020-06-29 DIAGNOSIS — N179 Acute kidney failure, unspecified: Secondary | ICD-10-CM | POA: Diagnosis not present

## 2020-06-29 DIAGNOSIS — I5032 Chronic diastolic (congestive) heart failure: Secondary | ICD-10-CM | POA: Diagnosis not present

## 2020-06-29 DIAGNOSIS — Z801 Family history of malignant neoplasm of trachea, bronchus and lung: Secondary | ICD-10-CM

## 2020-06-29 DIAGNOSIS — Y92009 Unspecified place in unspecified non-institutional (private) residence as the place of occurrence of the external cause: Secondary | ICD-10-CM

## 2020-06-29 DIAGNOSIS — I480 Paroxysmal atrial fibrillation: Secondary | ICD-10-CM | POA: Diagnosis present

## 2020-06-29 DIAGNOSIS — R55 Syncope and collapse: Secondary | ICD-10-CM | POA: Diagnosis not present

## 2020-06-29 DIAGNOSIS — F4329 Adjustment disorder with other symptoms: Secondary | ICD-10-CM | POA: Diagnosis not present

## 2020-06-29 DIAGNOSIS — M1990 Unspecified osteoarthritis, unspecified site: Secondary | ICD-10-CM | POA: Diagnosis present

## 2020-06-29 DIAGNOSIS — R58 Hemorrhage, not elsewhere classified: Secondary | ICD-10-CM | POA: Diagnosis not present

## 2020-06-29 DIAGNOSIS — R52 Pain, unspecified: Secondary | ICD-10-CM | POA: Diagnosis not present

## 2020-06-29 DIAGNOSIS — F4320 Adjustment disorder, unspecified: Secondary | ICD-10-CM | POA: Diagnosis present

## 2020-06-29 DIAGNOSIS — I272 Pulmonary hypertension, unspecified: Secondary | ICD-10-CM | POA: Diagnosis present

## 2020-06-29 DIAGNOSIS — Z882 Allergy status to sulfonamides status: Secondary | ICD-10-CM

## 2020-06-29 DIAGNOSIS — R001 Bradycardia, unspecified: Secondary | ICD-10-CM | POA: Diagnosis present

## 2020-06-29 DIAGNOSIS — Z953 Presence of xenogenic heart valve: Secondary | ICD-10-CM

## 2020-06-29 DIAGNOSIS — J449 Chronic obstructive pulmonary disease, unspecified: Secondary | ICD-10-CM | POA: Diagnosis present

## 2020-06-29 DIAGNOSIS — I350 Nonrheumatic aortic (valve) stenosis: Principal | ICD-10-CM | POA: Diagnosis present

## 2020-06-29 DIAGNOSIS — Z66 Do not resuscitate: Secondary | ICD-10-CM | POA: Diagnosis present

## 2020-06-29 DIAGNOSIS — W19XXXA Unspecified fall, initial encounter: Secondary | ICD-10-CM | POA: Diagnosis not present

## 2020-06-29 DIAGNOSIS — F32A Depression, unspecified: Secondary | ICD-10-CM | POA: Diagnosis present

## 2020-06-29 DIAGNOSIS — W010XXA Fall on same level from slipping, tripping and stumbling without subsequent striking against object, initial encounter: Secondary | ICD-10-CM | POA: Diagnosis present

## 2020-06-29 DIAGNOSIS — S6991XA Unspecified injury of right wrist, hand and finger(s), initial encounter: Secondary | ICD-10-CM | POA: Diagnosis not present

## 2020-06-29 DIAGNOSIS — E785 Hyperlipidemia, unspecified: Secondary | ICD-10-CM | POA: Diagnosis present

## 2020-06-29 DIAGNOSIS — I7090 Unspecified atherosclerosis: Secondary | ICD-10-CM | POA: Diagnosis not present

## 2020-06-29 DIAGNOSIS — I10 Essential (primary) hypertension: Secondary | ICD-10-CM | POA: Diagnosis not present

## 2020-06-29 DIAGNOSIS — Z888 Allergy status to other drugs, medicaments and biological substances status: Secondary | ICD-10-CM

## 2020-06-29 DIAGNOSIS — N1832 Chronic kidney disease, stage 3b: Secondary | ICD-10-CM | POA: Diagnosis present

## 2020-06-29 DIAGNOSIS — S51011A Laceration without foreign body of right elbow, initial encounter: Secondary | ICD-10-CM | POA: Diagnosis present

## 2020-06-29 DIAGNOSIS — Z7951 Long term (current) use of inhaled steroids: Secondary | ICD-10-CM

## 2020-06-29 DIAGNOSIS — H548 Legal blindness, as defined in USA: Secondary | ICD-10-CM | POA: Diagnosis present

## 2020-06-29 DIAGNOSIS — J8410 Pulmonary fibrosis, unspecified: Secondary | ICD-10-CM | POA: Diagnosis present

## 2020-06-29 DIAGNOSIS — I712 Thoracic aortic aneurysm, without rupture: Secondary | ICD-10-CM | POA: Diagnosis present

## 2020-06-29 DIAGNOSIS — R296 Repeated falls: Secondary | ICD-10-CM | POA: Diagnosis present

## 2020-06-29 DIAGNOSIS — M109 Gout, unspecified: Secondary | ICD-10-CM | POA: Diagnosis present

## 2020-06-29 DIAGNOSIS — Z79899 Other long term (current) drug therapy: Secondary | ICD-10-CM

## 2020-06-29 DIAGNOSIS — R42 Dizziness and giddiness: Secondary | ICD-10-CM | POA: Diagnosis not present

## 2020-06-29 DIAGNOSIS — Z91013 Allergy to seafood: Secondary | ICD-10-CM

## 2020-06-29 DIAGNOSIS — I160 Hypertensive urgency: Secondary | ICD-10-CM | POA: Diagnosis present

## 2020-06-29 DIAGNOSIS — S0003XA Contusion of scalp, initial encounter: Secondary | ICD-10-CM | POA: Diagnosis present

## 2020-06-29 DIAGNOSIS — Z8249 Family history of ischemic heart disease and other diseases of the circulatory system: Secondary | ICD-10-CM

## 2020-06-29 LAB — CBC
HCT: 34.8 % — ABNORMAL LOW (ref 36.0–46.0)
Hemoglobin: 11.5 g/dL — ABNORMAL LOW (ref 12.0–15.0)
MCH: 30.9 pg (ref 26.0–34.0)
MCHC: 33 g/dL (ref 30.0–36.0)
MCV: 93.5 fL (ref 80.0–100.0)
Platelets: 232 10*3/uL (ref 150–400)
RBC: 3.72 MIL/uL — ABNORMAL LOW (ref 3.87–5.11)
RDW: 13.1 % (ref 11.5–15.5)
WBC: 7.4 10*3/uL (ref 4.0–10.5)
nRBC: 0 % (ref 0.0–0.2)

## 2020-06-29 LAB — BASIC METABOLIC PANEL
Anion gap: 13 (ref 5–15)
BUN: 49 mg/dL — ABNORMAL HIGH (ref 8–23)
CO2: 26 mmol/L (ref 22–32)
Calcium: 9.2 mg/dL (ref 8.9–10.3)
Chloride: 98 mmol/L (ref 98–111)
Creatinine, Ser: 1.82 mg/dL — ABNORMAL HIGH (ref 0.44–1.00)
GFR calc non Af Amer: 23 mL/min — ABNORMAL LOW (ref >60)
Glucose, Bld: 165 mg/dL — ABNORMAL HIGH (ref 70–99)
Potassium: 4 mmol/L (ref 3.5–5.1)
Sodium: 137 mmol/L (ref 135–145)

## 2020-06-29 LAB — TROPONIN I (HIGH SENSITIVITY): Troponin I (High Sensitivity): 20 ng/L — ABNORMAL HIGH (ref <18)

## 2020-06-29 IMAGING — CT CT CERVICAL SPINE W/O CM
3 of 4 series · 12 of 33 positions shown, 14 images · non-contrast
Comparison: None.

CLINICAL DATA: Fall, head injury

EXAM:
CT HEAD WITHOUT CONTRAST
CT CERVICAL SPINE WITHOUT CONTRAST
TECHNIQUE: Multidetector CT imaging of the head and cervical spine was
performed following the standard protocol without intravenous
contrast. Multiplanar CT image reconstructions of the cervical spine
were also generated.

[Series 6: sagittal bone · sagittal · 0.27mm/px · 5 of 55 slices shown, 6 images]
[im 19/55  bone]
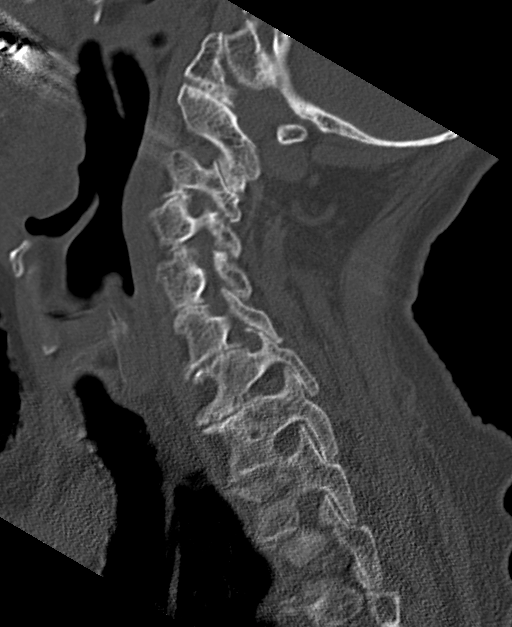
[im 23/55  bone]
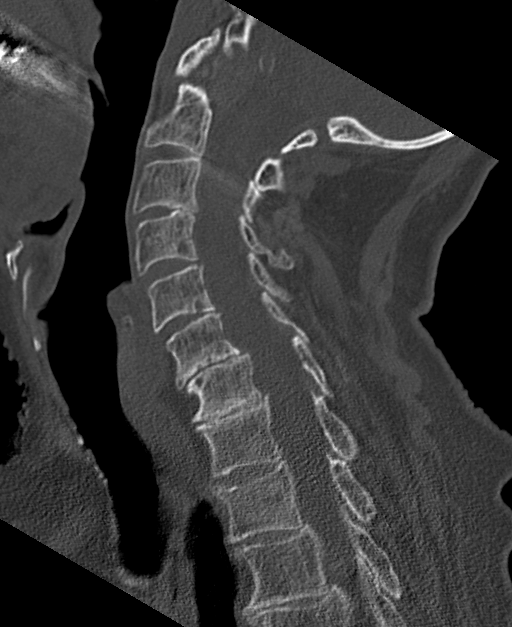
[im 28/55  soft-tissue]
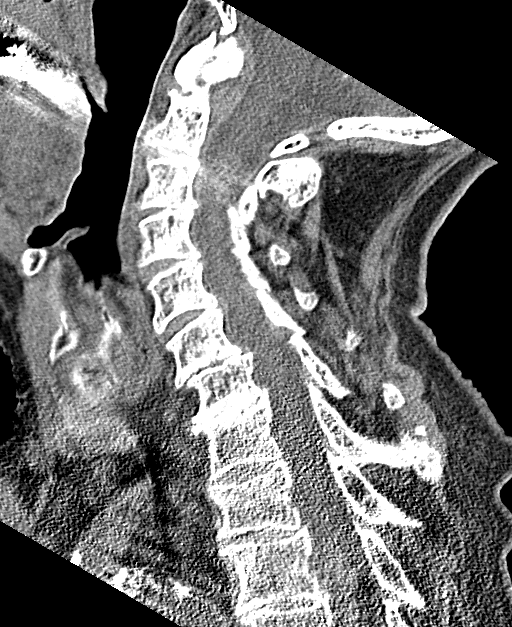
[im 28/55  bone]
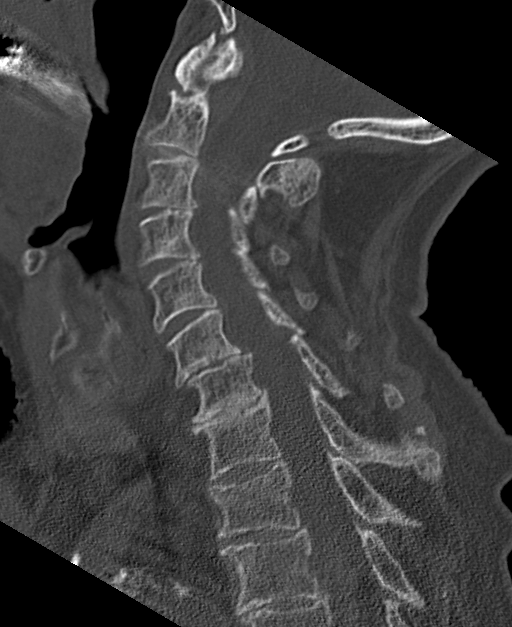
[im 32/55  bone]
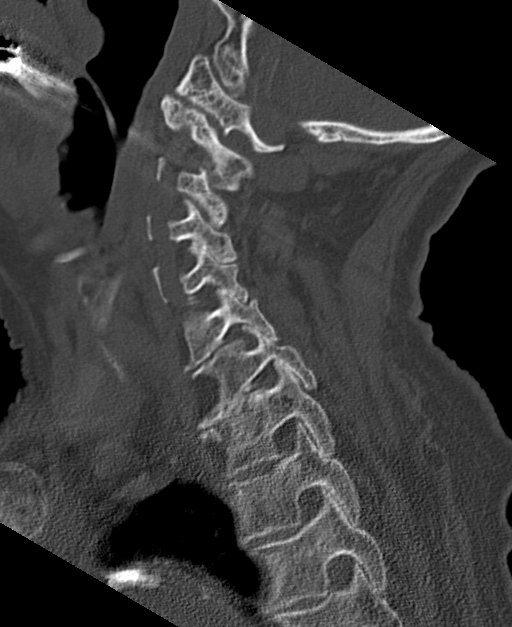
[im 37/55  bone]
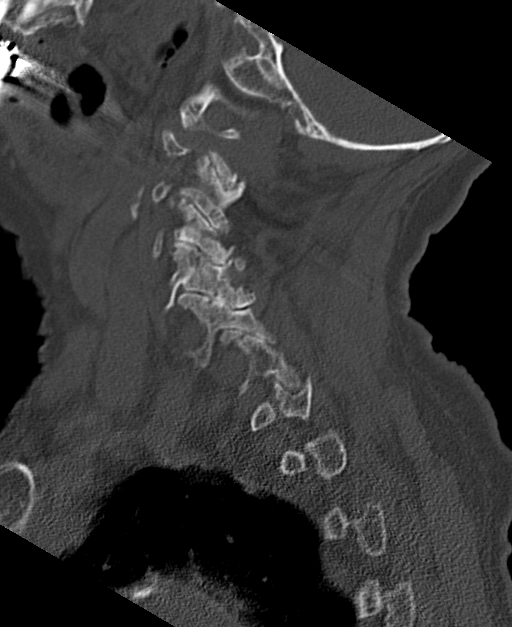

[Series 7: coronal bone · coronal · 0.21mm/px · 3 of 70 slices shown]
[im 20/70  bone]
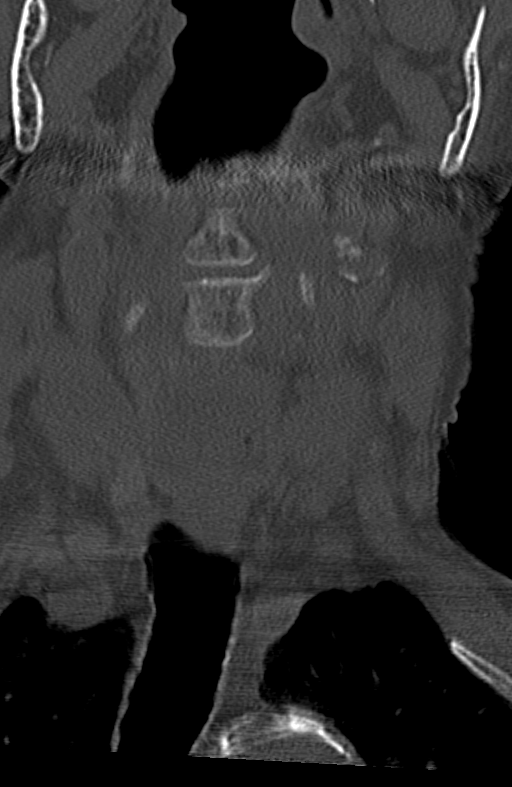
[im 30/70  bone]
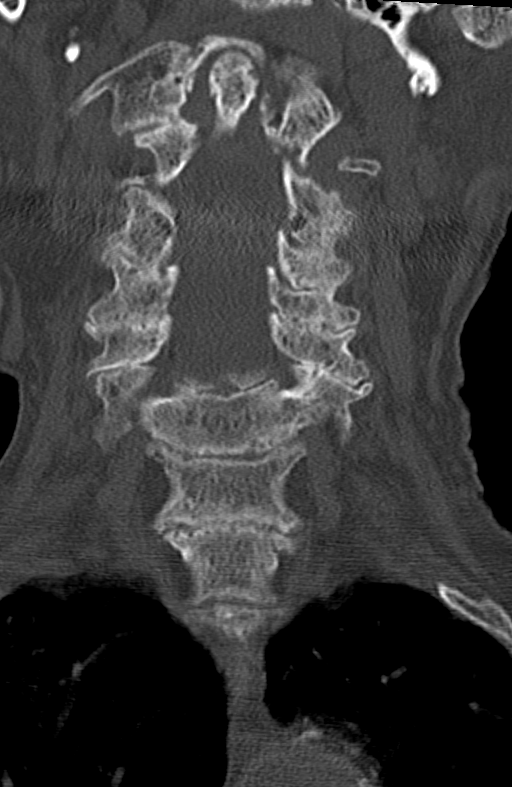
[im 40/70  bone]
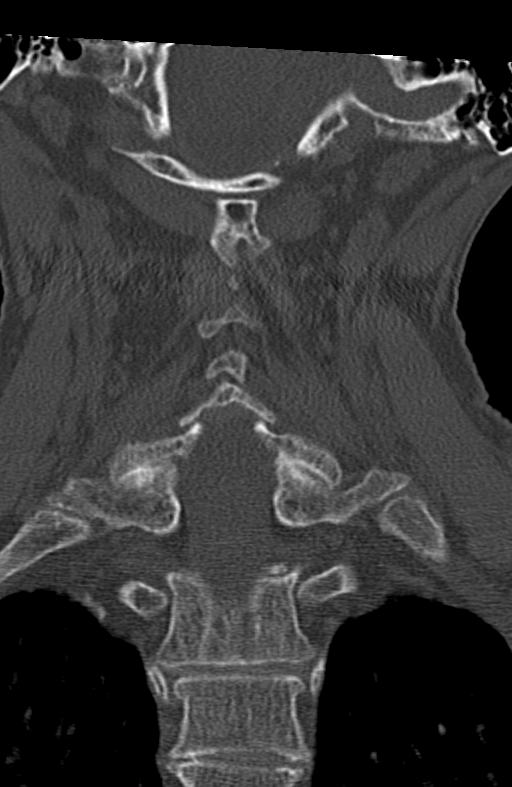

[Series 8: orthogonal bone · axial · 0.21mm/px · z∈[-317,-221]mm · 4 of 85 slices shown, 5 images]
[im 15/85  soft-tissue]
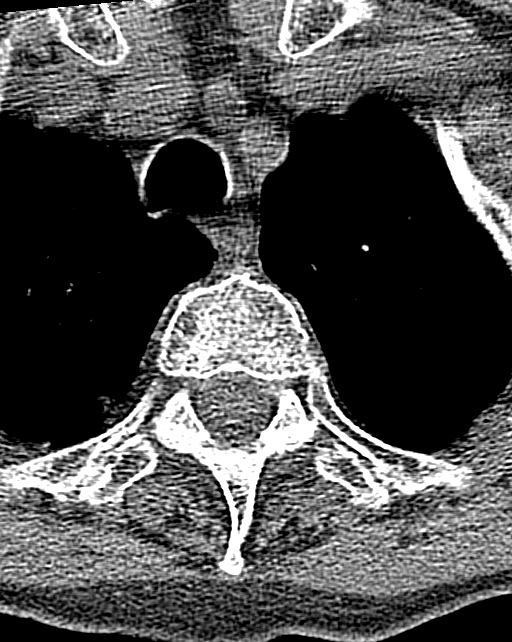
[im 15/85  bone]
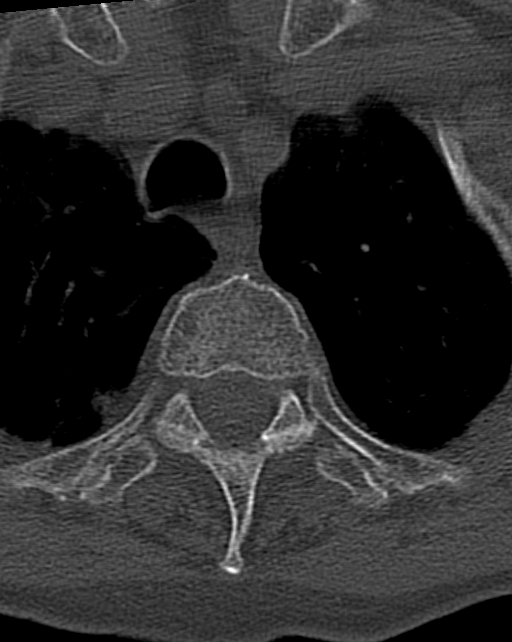
[im 29/85  bone]
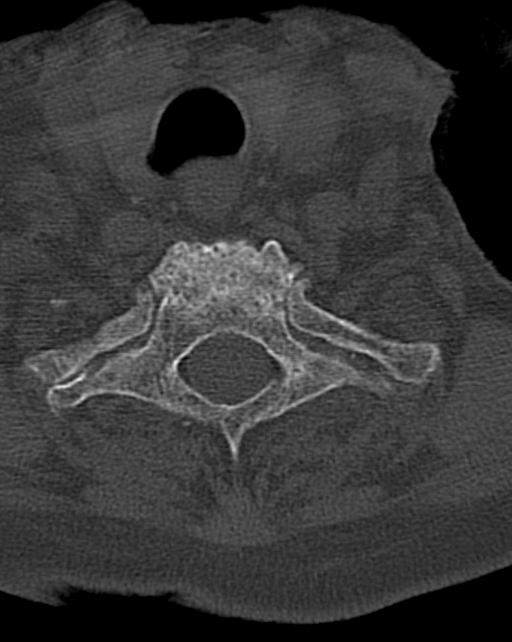
[im 57/85  bone]
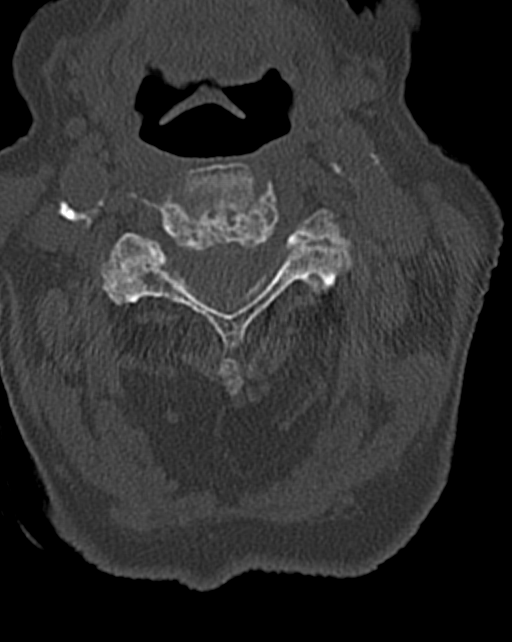
[im 71/85  bone]
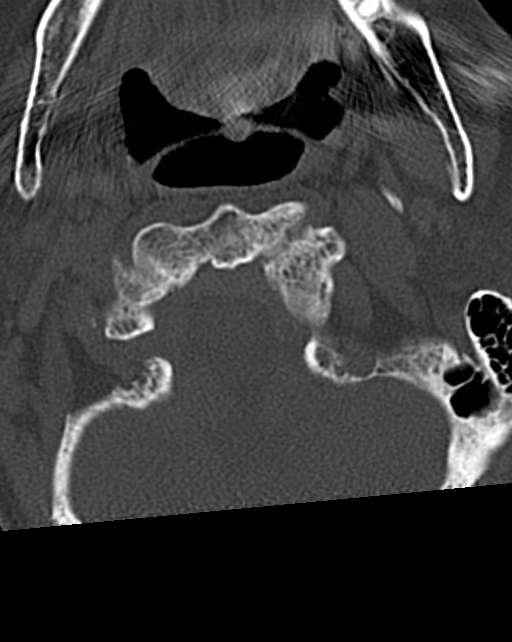

[12 of 33 positions shown; findings below may reference images not displayed]

FINDINGS: CT HEAD FINDINGS

Brain: Normal anatomic configuration. Parenchymal volume loss is
commensurate with the patient's age. Moderate subcortical and
periventricular white matter changes are present likely reflecting
the sequela of small vessel ischemia. No abnormal intra or
extra-axial mass lesion or fluid collection. No abnormal mass effect
or midline shift. No evidence of acute intracranial hemorrhage or
infarct. Ventricular size is normal. Cerebellum unremarkable.

Vascular: No asymmetric hyperdense vasculature at the skull base.
Extensive atherosclerotic calcification noted.

Skull: Intact

Sinuses/Orbits: Paranasal sinuses are clear. Orbits are
unremarkable.

Other: Mastoid air cells and middle ear cavities are clear.

CT CERVICAL SPINE FINDINGS

Alignment: Grade 1 anterolisthesis of C6 upon C7 is likely
degenerative in nature. Otherwise normal alignment.

Skull base and vertebrae: Craniocervical junction is unremarkable.
Atlantodental interval is normal. No acute fracture of the cervical
spine. No lytic or blastic bone lesion.

Soft tissues and spinal canal: No prevertebral fluid or swelling. No
visible canal hematoma.

Disc levels: There is intervertebral disc space narrowing and
endplate remodeling at C6-7 and C7-T1 in keeping with changes of
advanced degenerative disc disease. Vertebral body height has been
preserved. Review of the axial images demonstrates extensive
multilevel uncovertebral and facet arthrosis with resultant
multilevel neural foraminal narrowing. The spinal canal is widely
patent.

Upper chest: The visualized lung apices are clear. A a thoracic
aortic arch aneurysm is incompletely visualized on this examination
measuring at least 3.4 cm in greatest dimension.

Other: None significant
IMPRESSION: No acute intracranial injury.  No calvarial fracture.

No acute fracture of the cervical spine.

Incompletely evaluated thoracic aortic aneurysm. If indicated, this
would be better assessed with dedicated CT arteriography of the
chest.

Aortic aneurysm NOS ([8Q]-[8Q]).

## 2020-06-29 IMAGING — CR DG KNEE COMPLETE 4+V*R*
1 series · 4 of 4 positions shown · non-contrast
Comparison: None.

CLINICAL DATA: Fall, right knee pain

EXAM:
RIGHT KNEE - COMPLETE 4+ VIEW

[Series 1: dg knee complete 4 views right · 0.14mm/px · 4 of 4 slices shown]
[im 1/4]
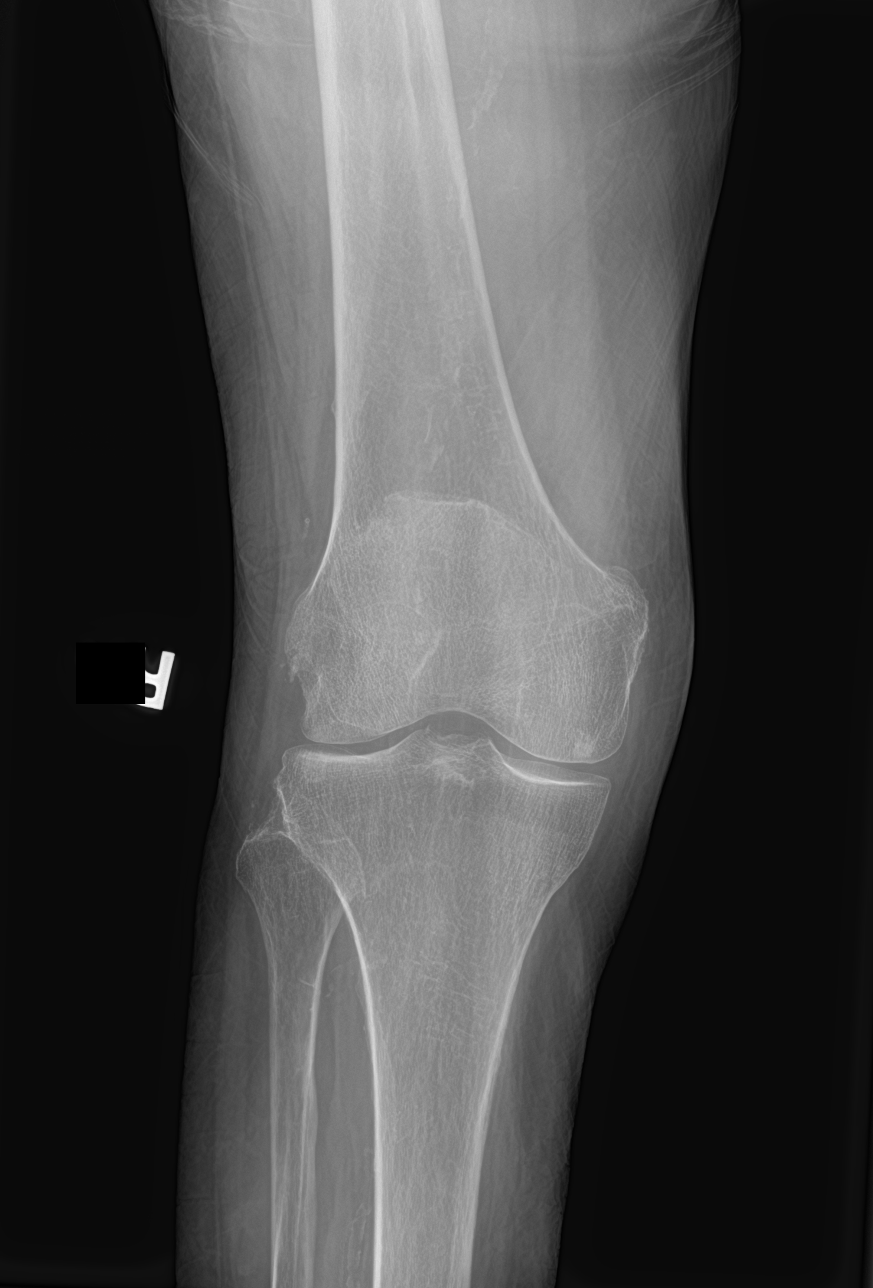
[im 2/4]
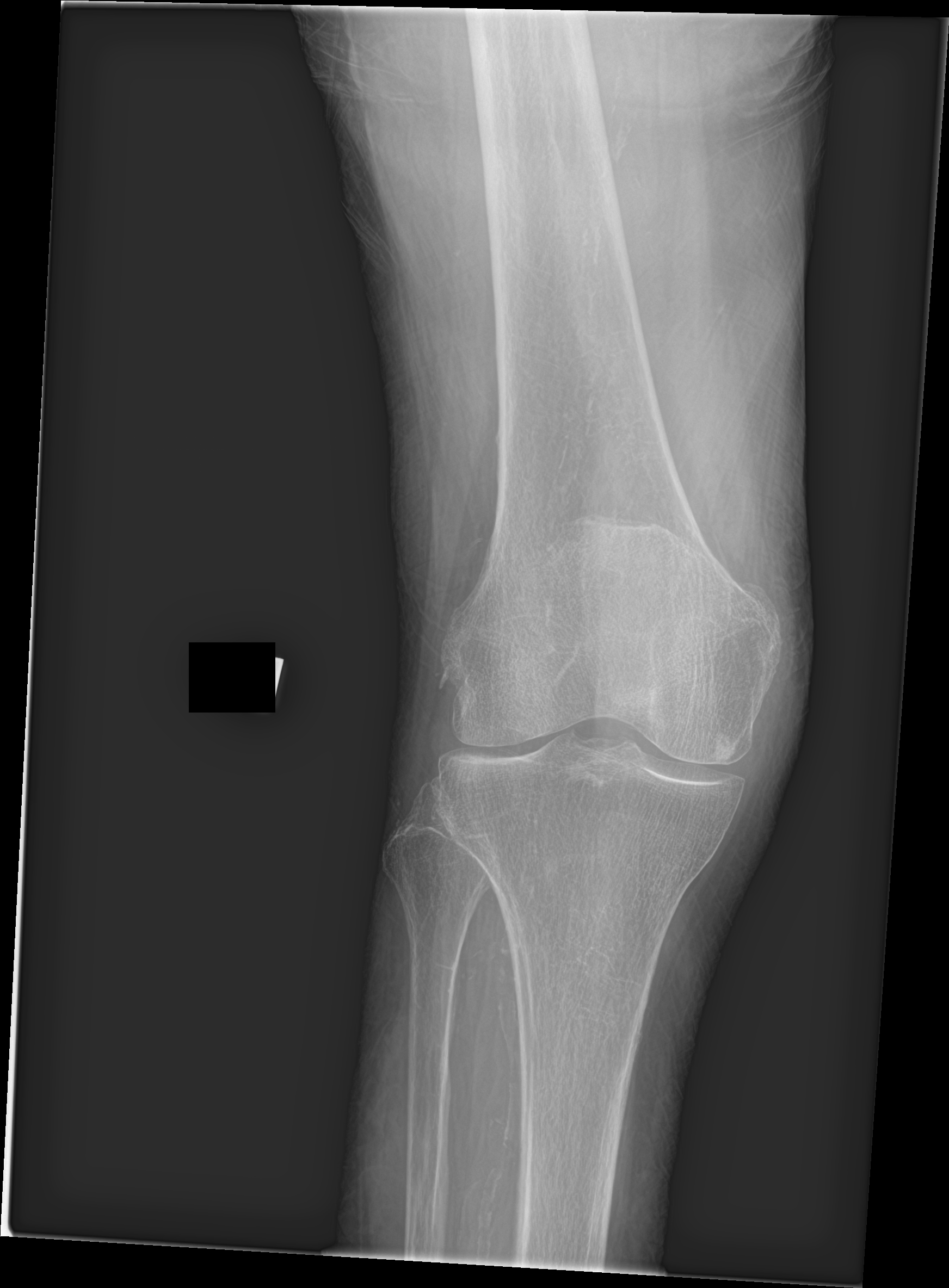
[im 3/4]
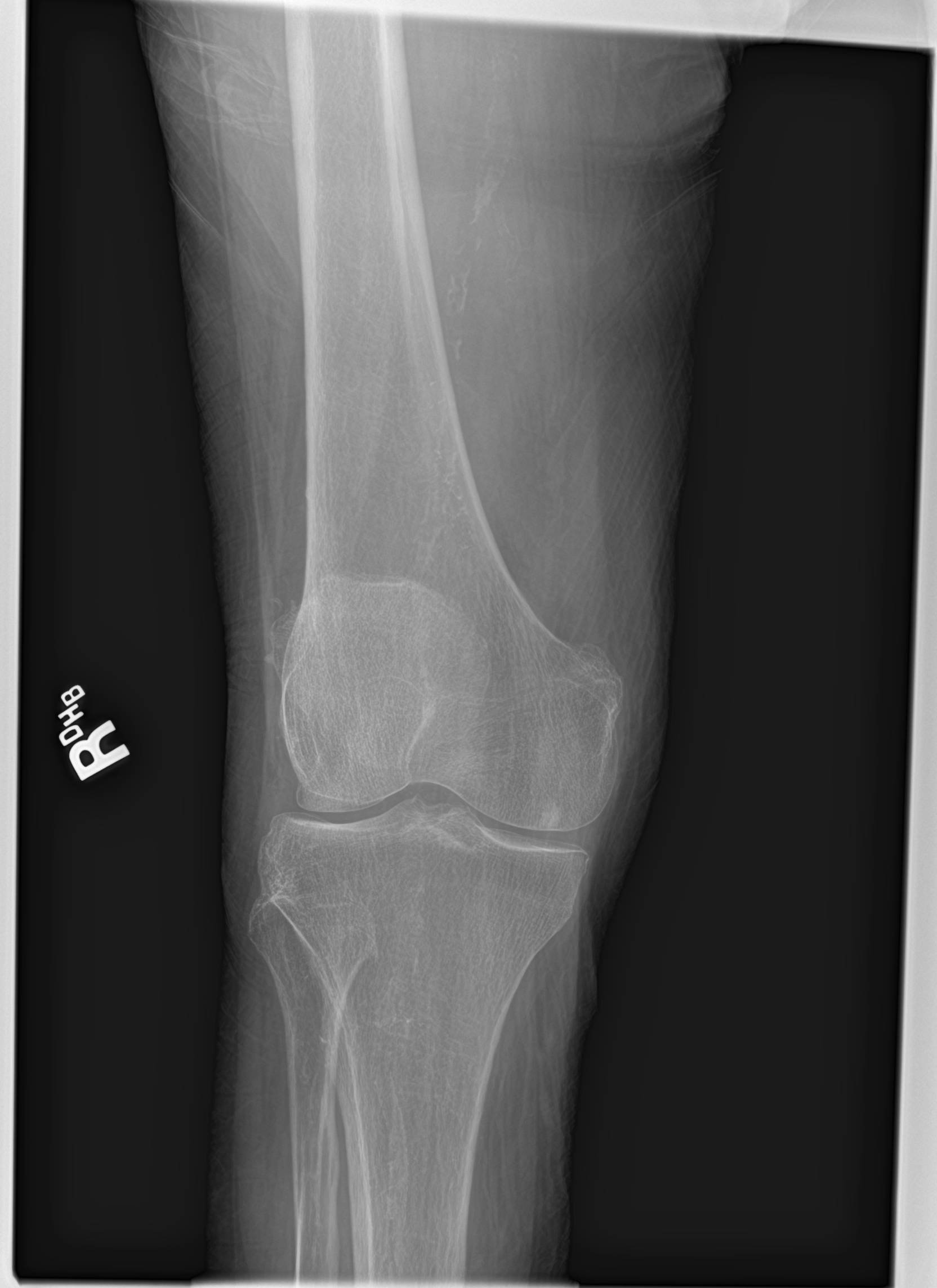
[im 4/4]
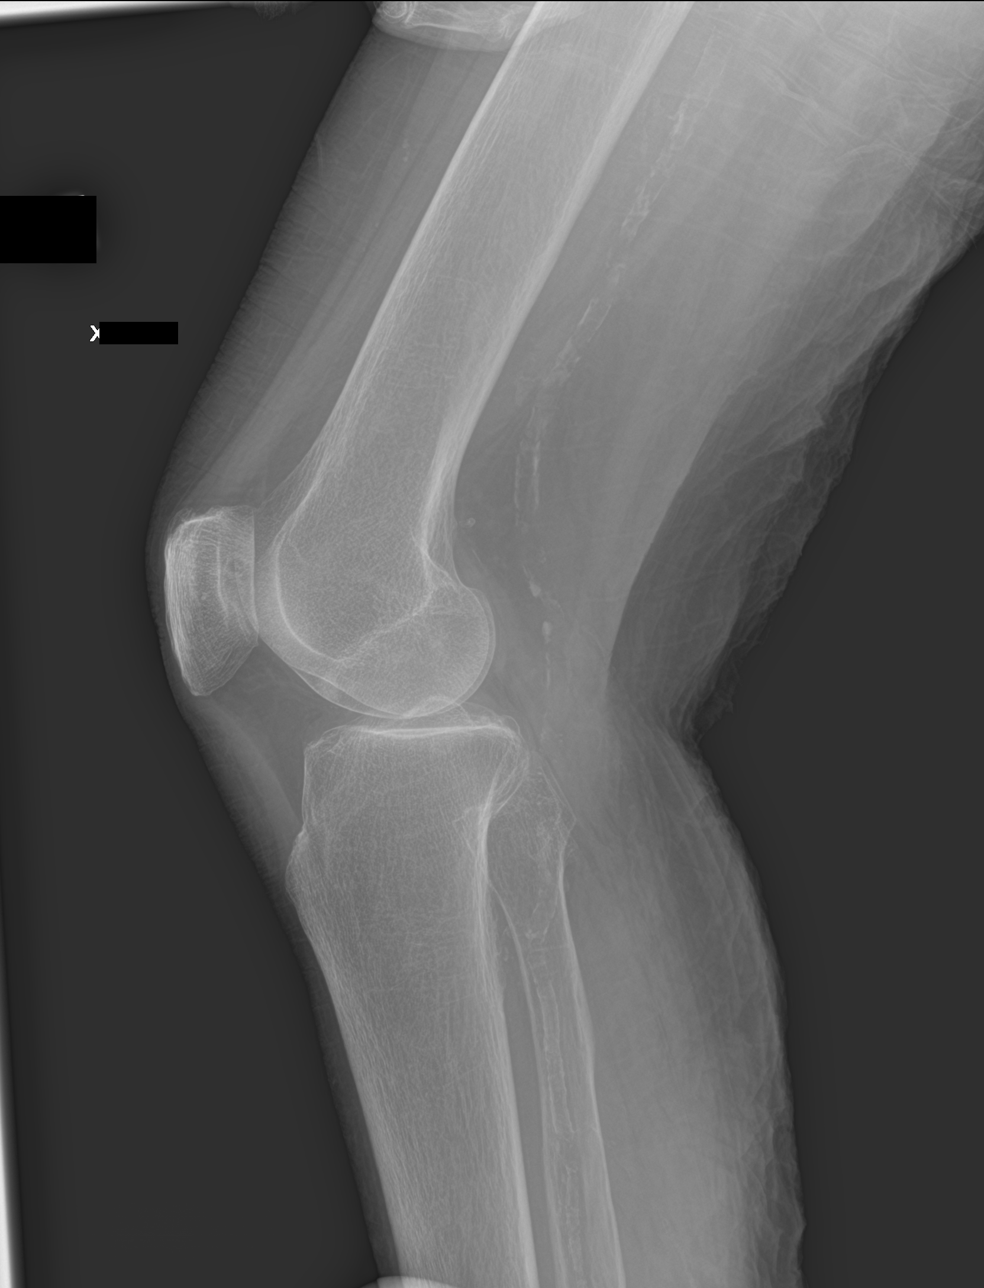

[4 of 4 positions shown; findings below may reference images not displayed]

FINDINGS: Four view radiograph right knee demonstrates normal alignment. No
fracture or dislocation. Joint spaces are preserved. No effusion.
Advanced vascular calcifications are seen within the posterior soft
tissues.
IMPRESSION: No acute fracture or dislocation.

## 2020-06-29 IMAGING — CR DG SHOULDER 2+V*R*
1 series · 4 of 4 positions shown · non-contrast
Comparison: None.

CLINICAL DATA: Fall, right shoulder pain

EXAM:
RIGHT SHOULDER - 2+ VIEW

[Series 1: dg shoulder right · 0.14mm/px · 4 of 4 slices shown]
[im 1/4]
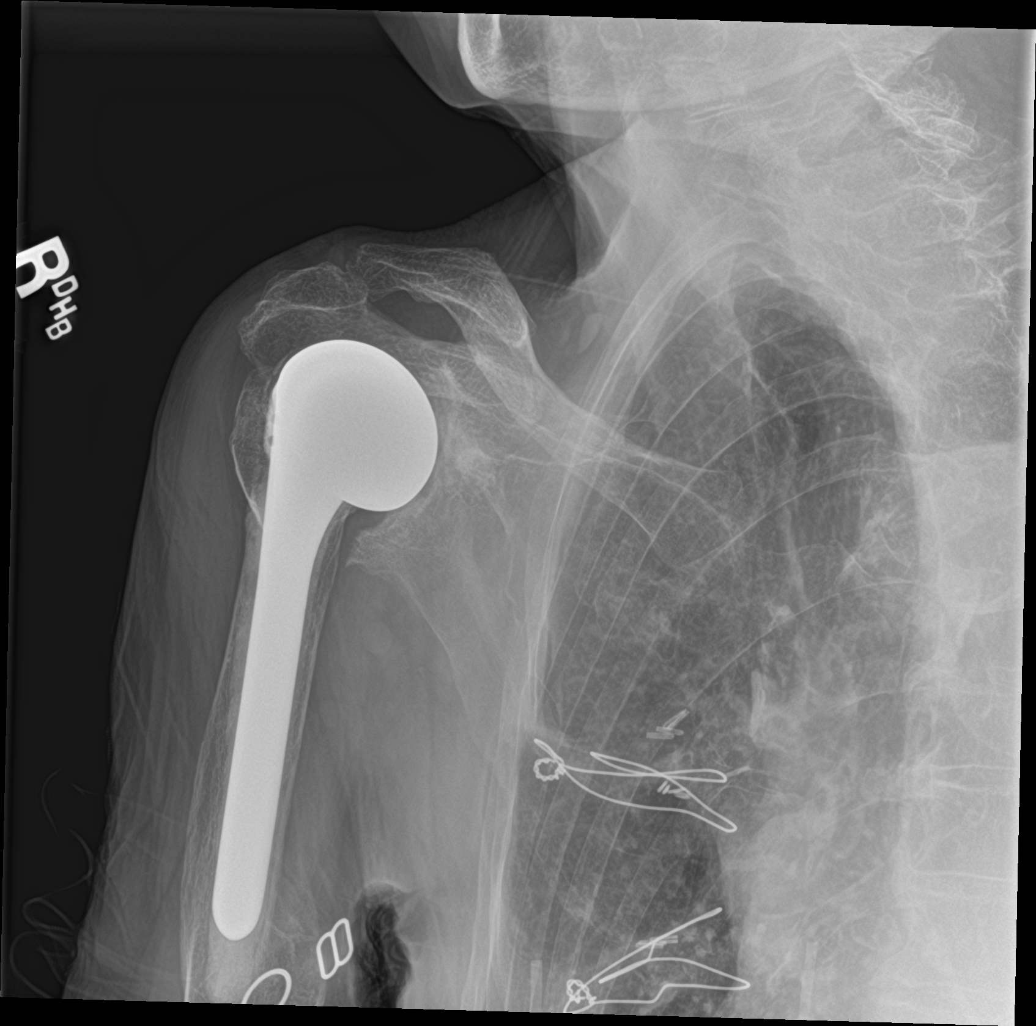
[im 2/4]
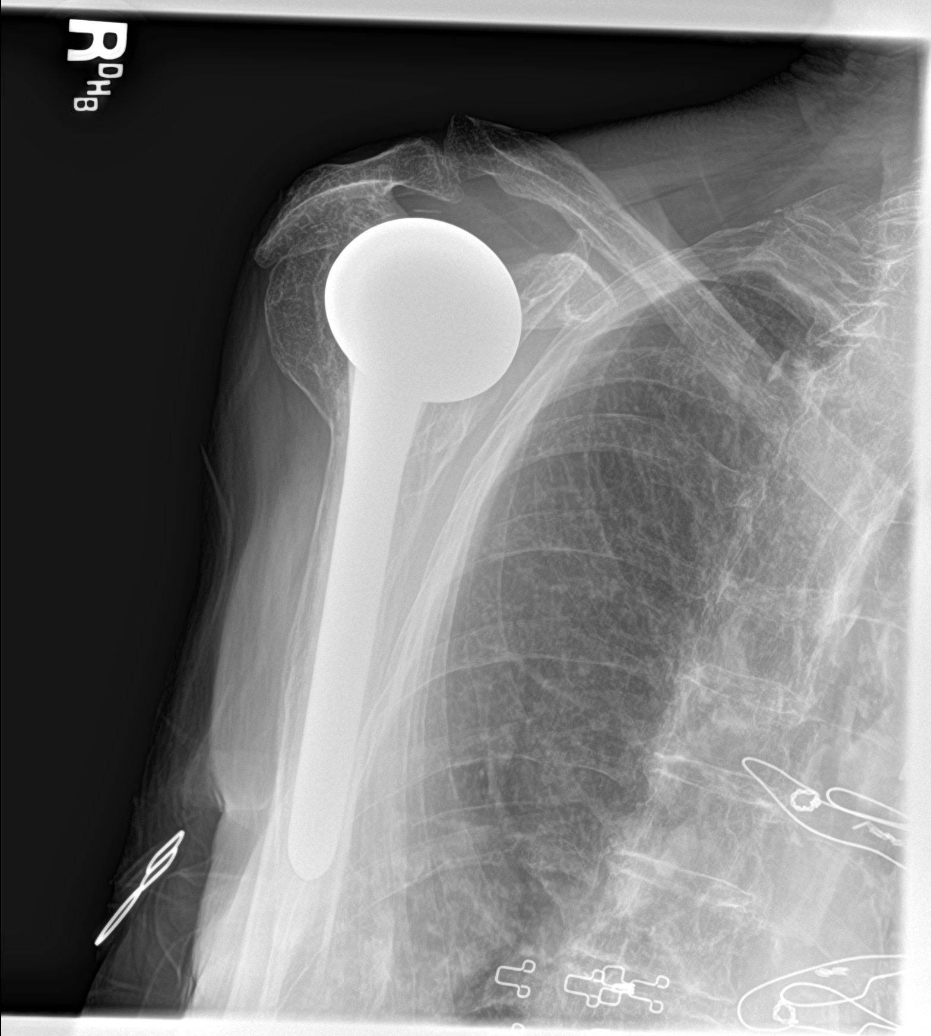
[im 3/4]
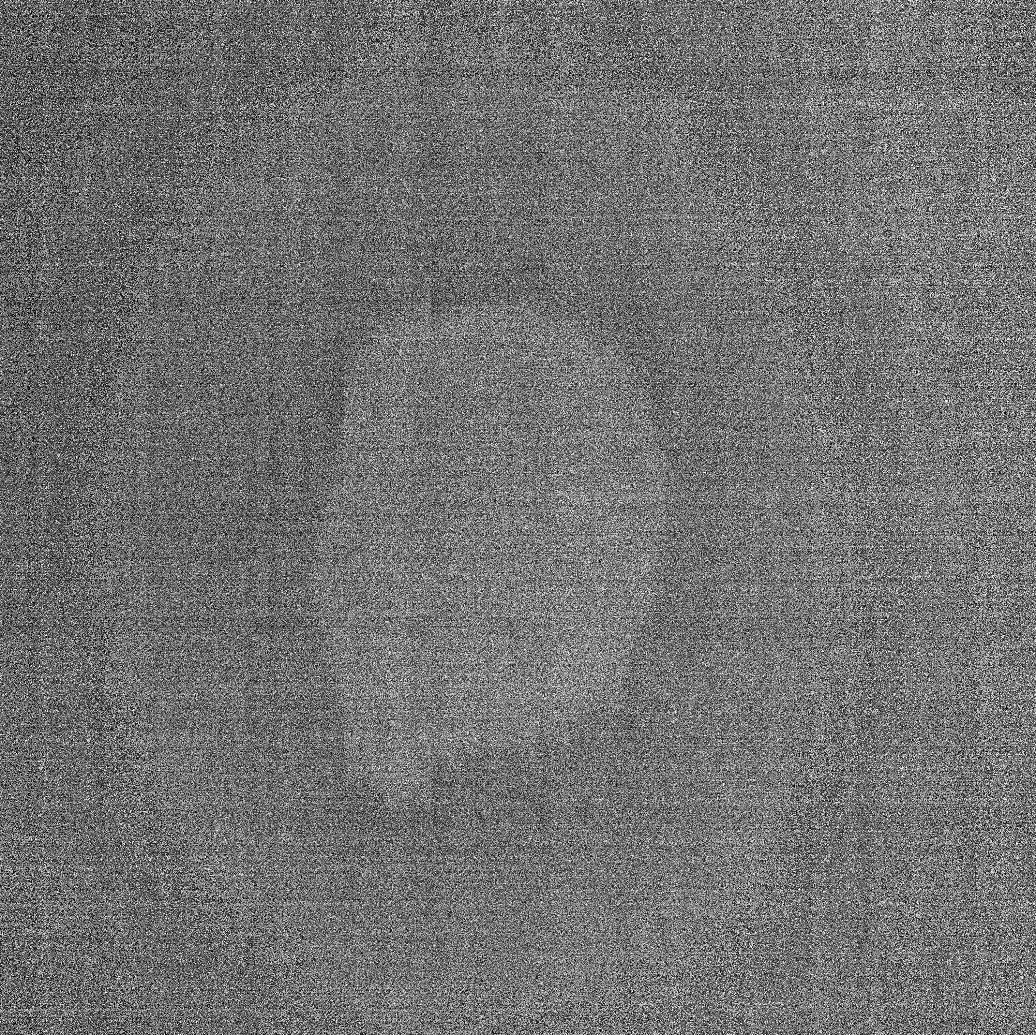
[im 4/4]
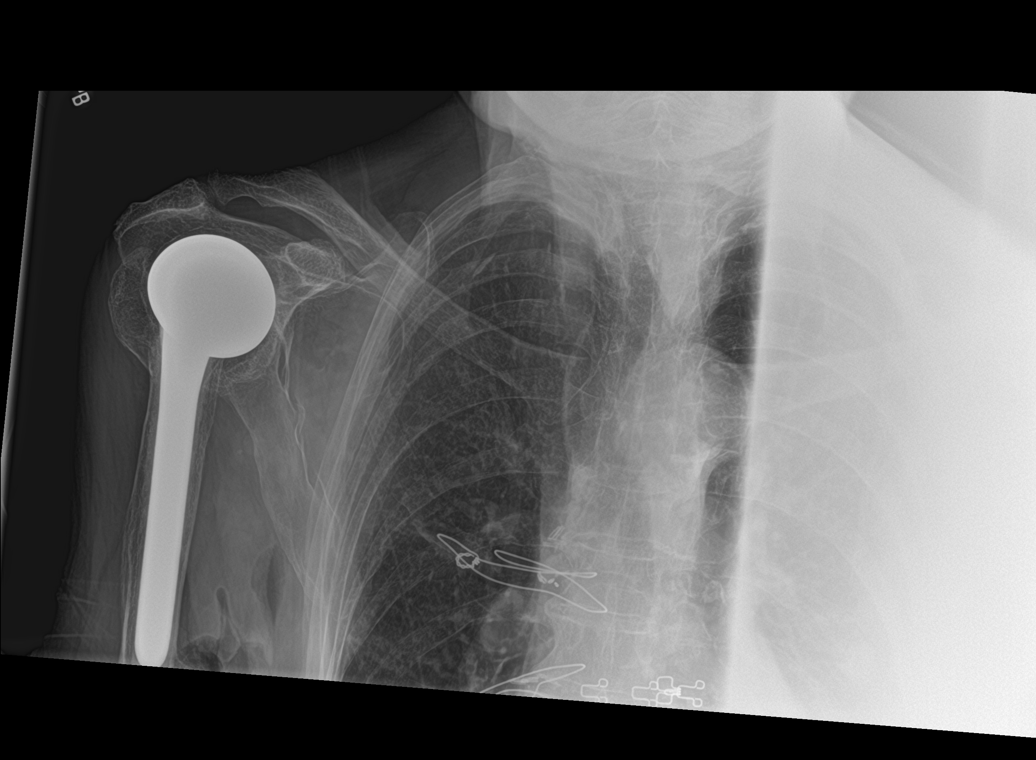

[4 of 4 positions shown; findings below may reference images not displayed]

FINDINGS: Right shoulder arthroplasty has been performed. Normal alignment. No
acute fracture or dislocation. Limited evaluation of the right
hemithorax is unremarkable.
IMPRESSION: No acute fracture or dislocation.

## 2020-06-29 IMAGING — CR DG HAND COMPLETE 3+V*R*
1 series · 3 of 3 positions shown · non-contrast
Comparison: None.

CLINICAL DATA: Initial evaluation for acute trauma, fall.

EXAM:
RIGHT HAND - COMPLETE 3+ VIEW

[Series 1: dg hand complete right · 0.14mm/px · 3 of 3 slices shown]
[im 1/3]
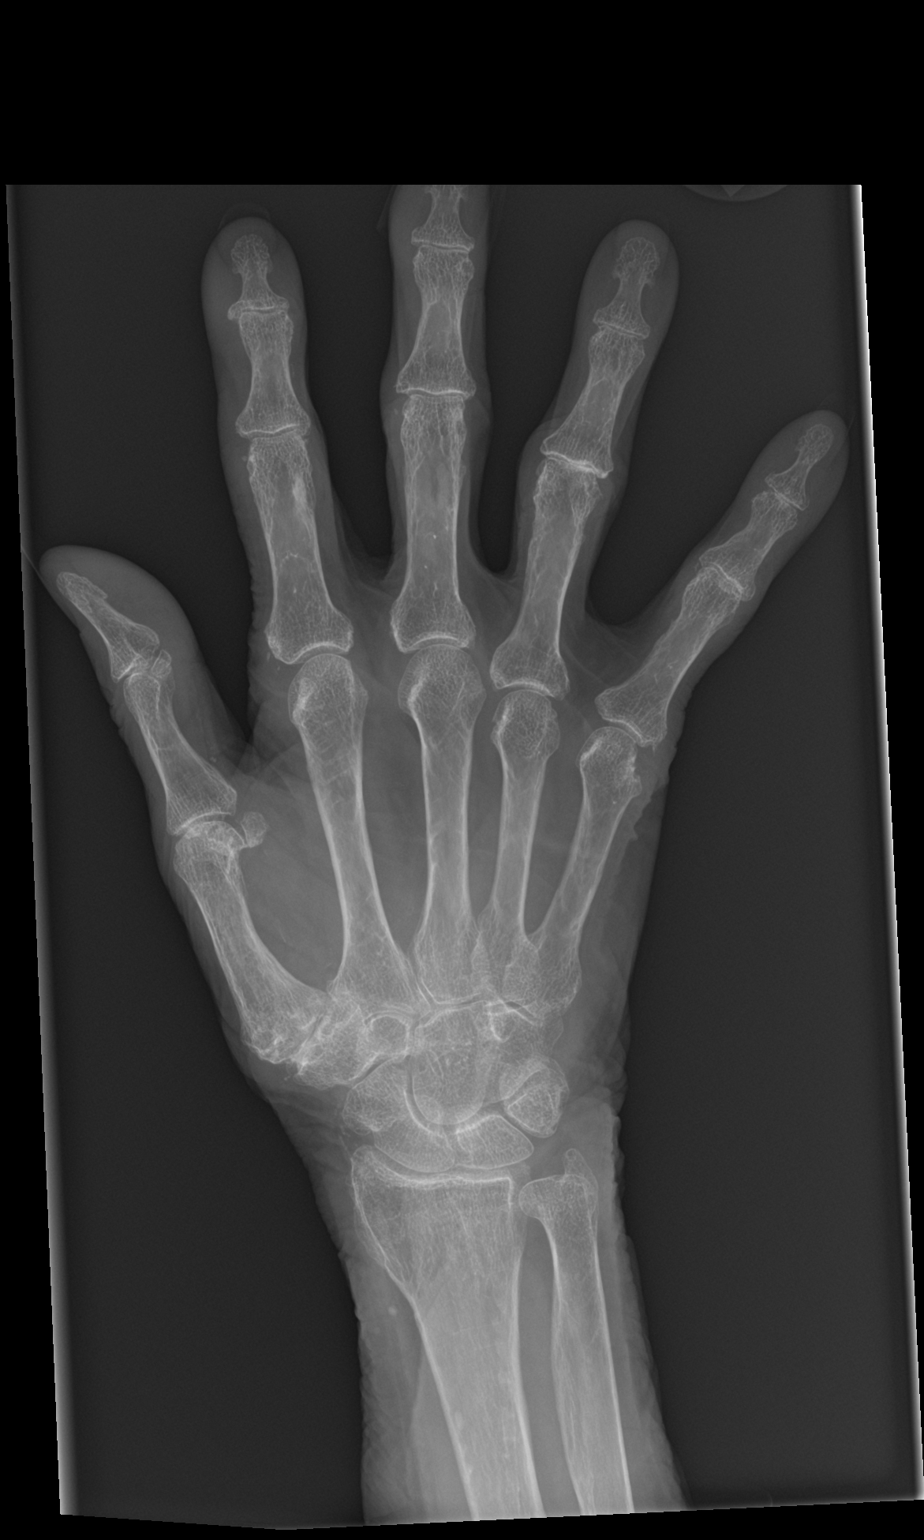
[im 2/3]
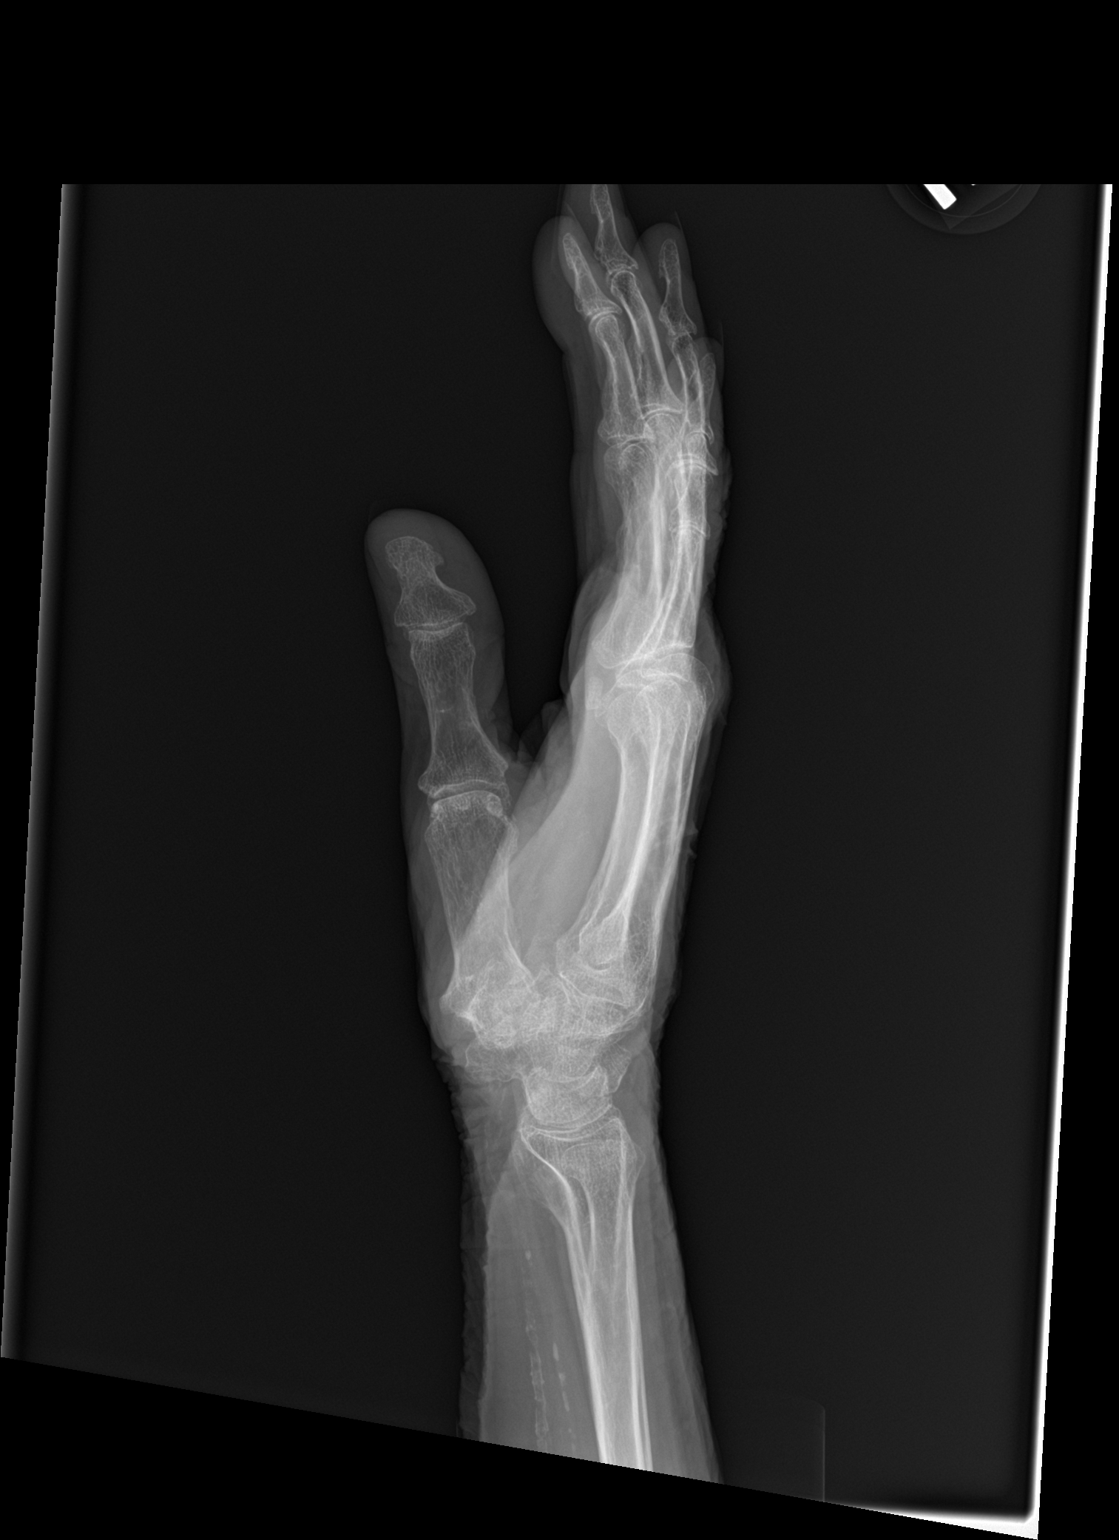
[im 3/3]
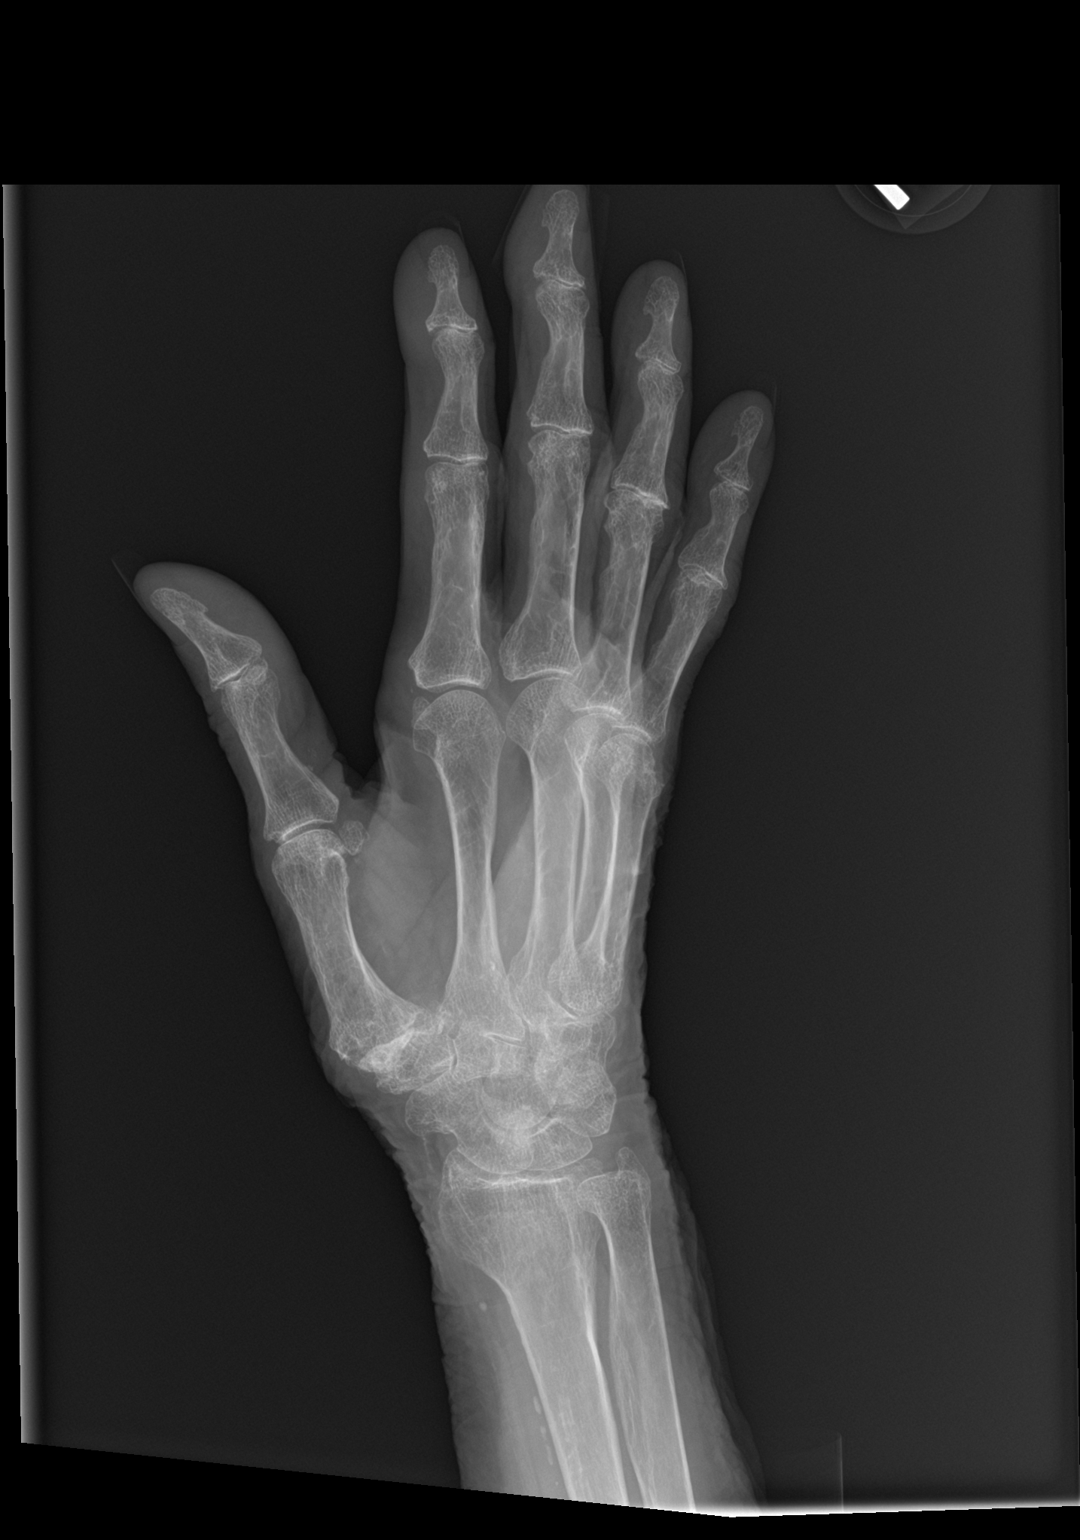

[3 of 3 positions shown; findings below may reference images not displayed]

FINDINGS: Bones are severely osteopenic, somewhat limiting assessment for
possible subtle acute nondisplaced fractures.

No acute fracture or dislocation. Prominent degenerative
osteoarthritic changes noted at the first CMC articulation. Few
scattered juxta-articular erosions with overhanging edges noted at
the right fourth and fifth MCP articulations, which could reflect
changes related underlying gouty arthropathy. No visible soft tissue
injury. Atherosclerotic change noted at the wrist.
IMPRESSION: 1. No acute osseous abnormality about the right hand.
2. Severe osteopenia.
3. Few scattered juxta-articular erosions with overhanging edges at
the right fourth and fifth MCP articulations, which could reflect
changes related to underlying gouty arthropathy.

## 2020-06-29 IMAGING — CT CT HEAD W/O CM
3 series · 14 of 47 positions shown, 16 images · non-contrast
Comparison: None.

CLINICAL DATA: Fall, head injury

EXAM:
CT HEAD WITHOUT CONTRAST
CT CERVICAL SPINE WITHOUT CONTRAST
TECHNIQUE: Multidetector CT imaging of the head and cervical spine was
performed following the standard protocol without intravenous
contrast. Multiplanar CT image reconstructions of the cervical spine
were also generated.

[Series 2: head wo · axial · 0.40mm/px · z∈[-157,-32]mm · 8 of 30 slices shown, 10 images]
[im 3/30  brain]
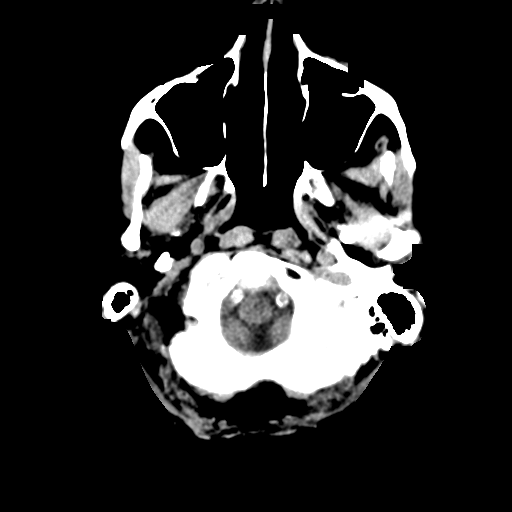
[im 3/30  bone]
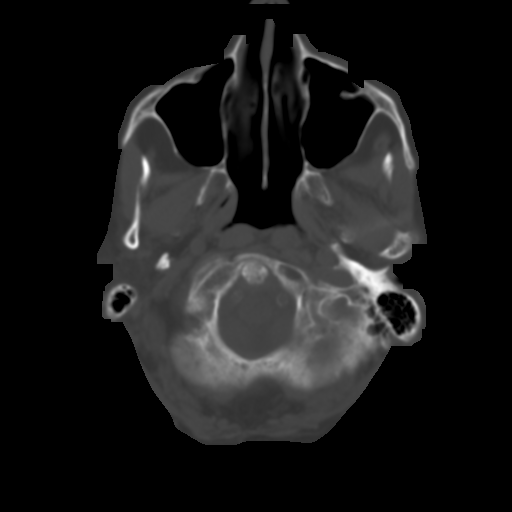
[im 7/30  brain]
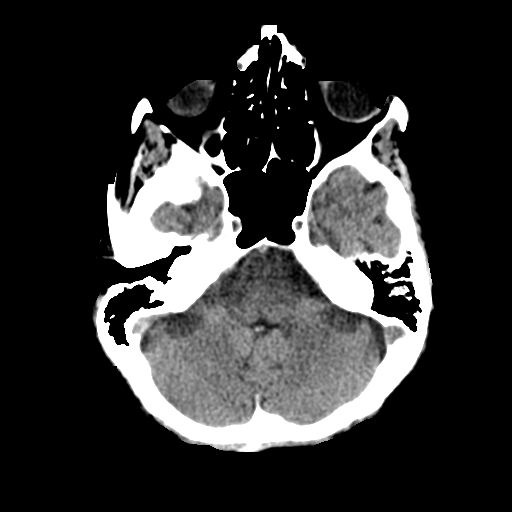
[im 10/30  brain]
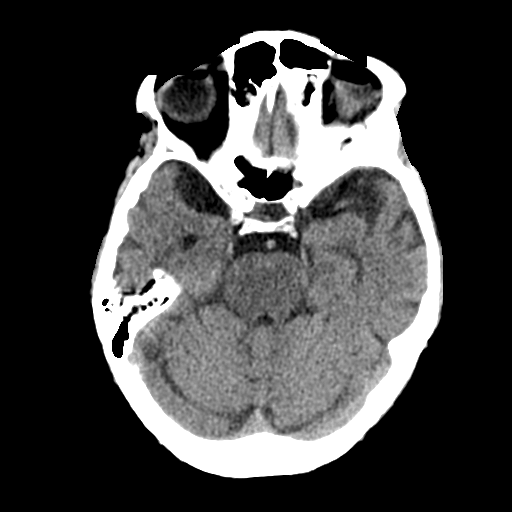
[im 14/30  brain]
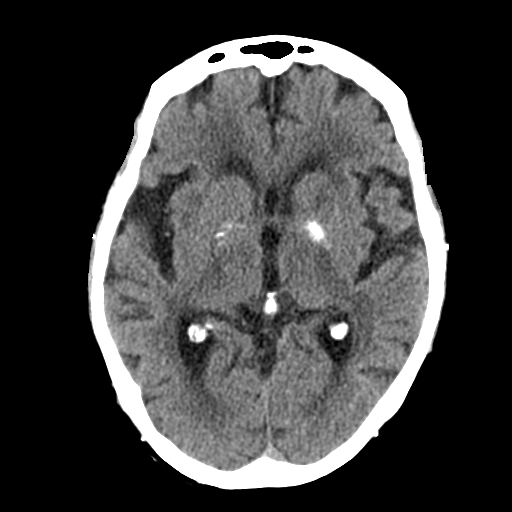
[im 17/30  brain]
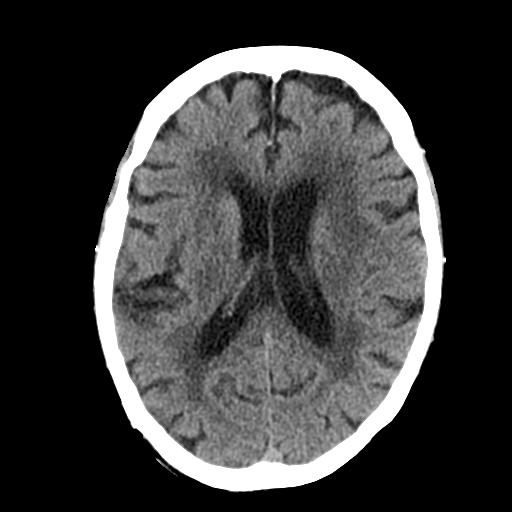
[im 17/30  bone]
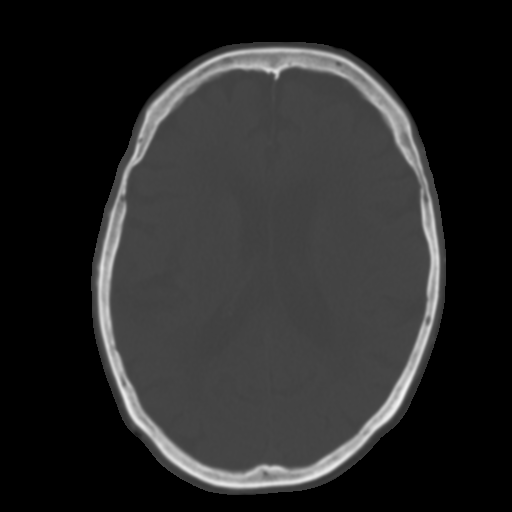
[im 21/30  brain]
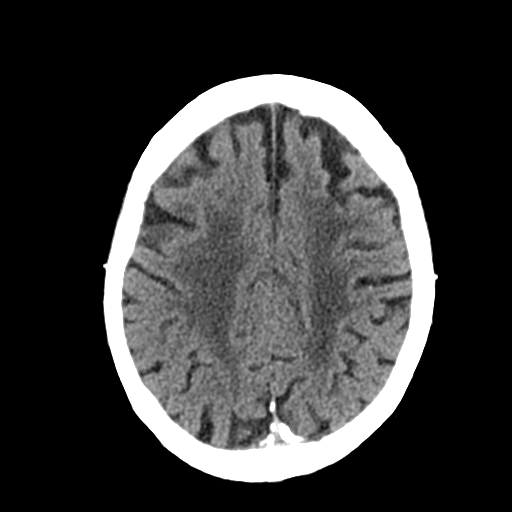
[im 24/30  brain]
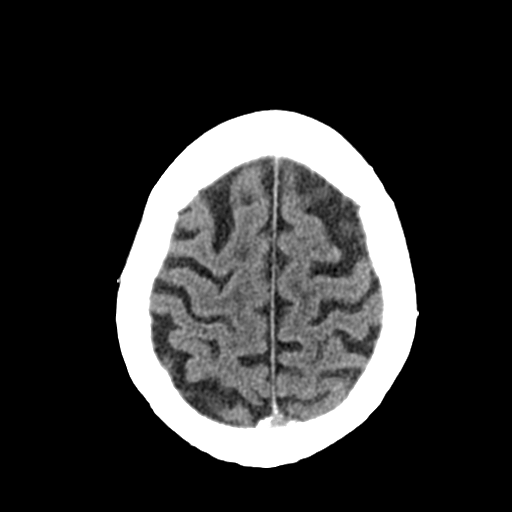
[im 28/30  brain]
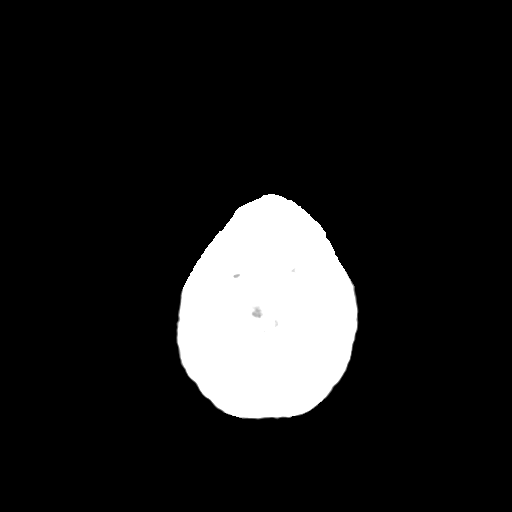

[Series 4: coronal soft tissue · coronal · 0.29mm/px · 3 of 66 slices shown]
[im 23/66  brain]
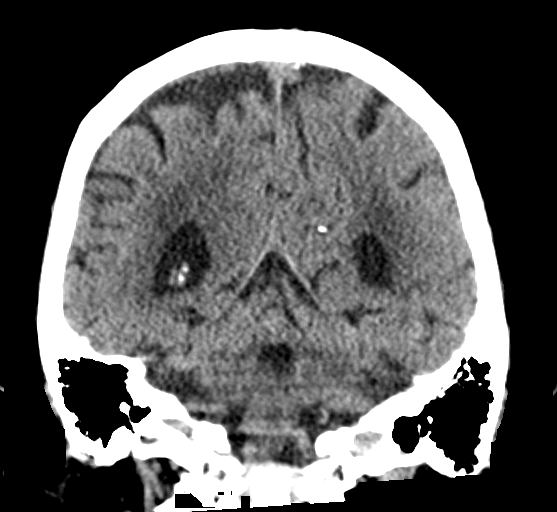
[im 30/66  brain]
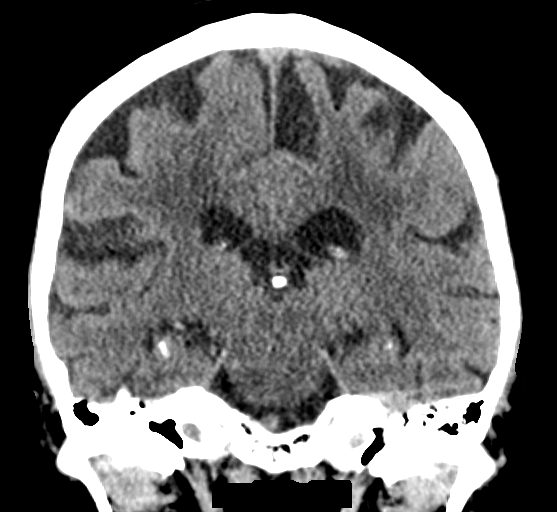
[im 37/66  brain]
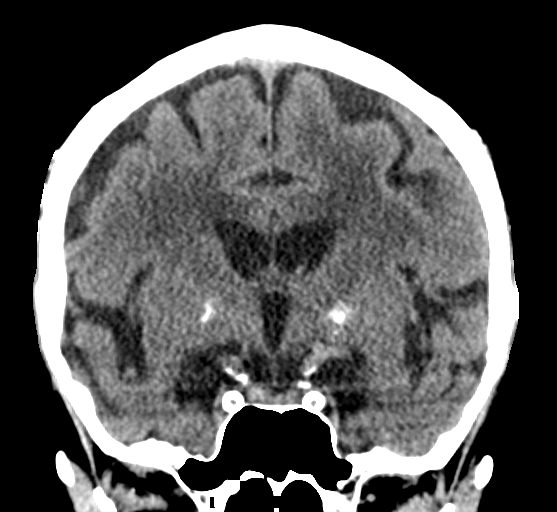

[Series 5: sagittal soft tissue · sagittal · 0.29mm/px · 3 of 54 slices shown]
[im 18/54  brain]
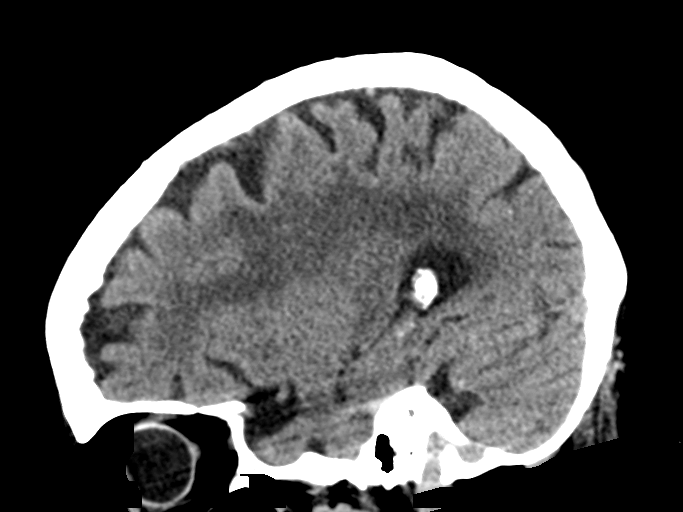
[im 27/54  brain]
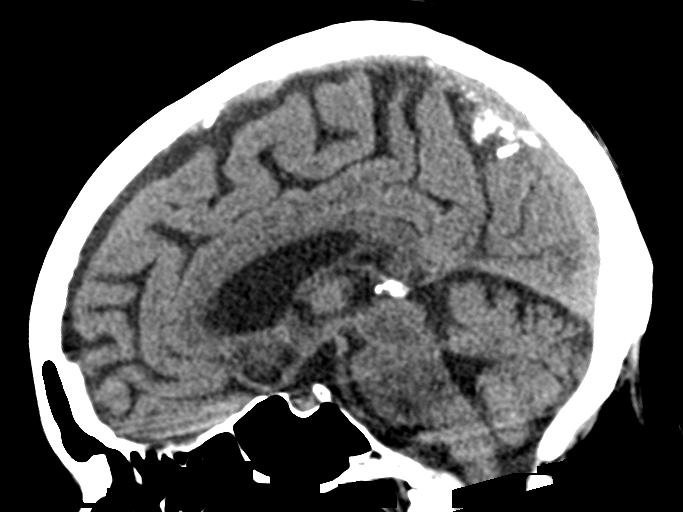
[im 36/54  brain]
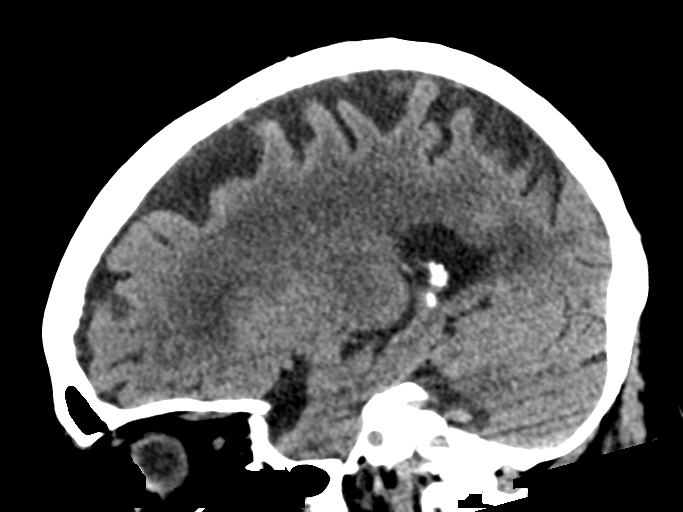

[14 of 47 positions shown; findings below may reference images not displayed]

FINDINGS: CT HEAD FINDINGS

Brain: Normal anatomic configuration. Parenchymal volume loss is
commensurate with the patient's age. Moderate subcortical and
periventricular white matter changes are present likely reflecting
the sequela of small vessel ischemia. No abnormal intra or
extra-axial mass lesion or fluid collection. No abnormal mass effect
or midline shift. No evidence of acute intracranial hemorrhage or
infarct. Ventricular size is normal. Cerebellum unremarkable.

Vascular: No asymmetric hyperdense vasculature at the skull base.
Extensive atherosclerotic calcification noted.

Skull: Intact

Sinuses/Orbits: Paranasal sinuses are clear. Orbits are
unremarkable.

Other: Mastoid air cells and middle ear cavities are clear.

CT CERVICAL SPINE FINDINGS

Alignment: Grade 1 anterolisthesis of C6 upon C7 is likely
degenerative in nature. Otherwise normal alignment.

Skull base and vertebrae: Craniocervical junction is unremarkable.
Atlantodental interval is normal. No acute fracture of the cervical
spine. No lytic or blastic bone lesion.

Soft tissues and spinal canal: No prevertebral fluid or swelling. No
visible canal hematoma.

Disc levels: There is intervertebral disc space narrowing and
endplate remodeling at C6-7 and C7-T1 in keeping with changes of
advanced degenerative disc disease. Vertebral body height has been
preserved. Review of the axial images demonstrates extensive
multilevel uncovertebral and facet arthrosis with resultant
multilevel neural foraminal narrowing. The spinal canal is widely
patent.

Upper chest: The visualized lung apices are clear. A a thoracic
aortic arch aneurysm is incompletely visualized on this examination
measuring at least 3.4 cm in greatest dimension.

Other: None significant
IMPRESSION: No acute intracranial injury.  No calvarial fracture.

No acute fracture of the cervical spine.

Incompletely evaluated thoracic aortic aneurysm. If indicated, this
would be better assessed with dedicated CT arteriography of the
chest.

Aortic aneurysm NOS ([8Q]-[8Q]).

## 2020-06-29 NOTE — ED Triage Notes (Signed)
Pt to ED via EMS from home c/o fall today, stating she doesn't know why she fell.  Has been having falls for several months now.  Lives alone.  States hit head today, face, right elbow and right hand.  Pt alert to self and place, states year 2022 and in September.  Denies, LOC.  Chest rise even and unlabored, in NAD at this time.

## 2020-06-29 NOTE — ED Triage Notes (Signed)
EMS brought pt in from home, lives alone; st tripped & fell in kitchen while walking to phone; skin tear rt elbow & hand, lac to side of nose and hematoma to scalp; pt to lobby via w/c with no distress noted

## 2020-06-30 ENCOUNTER — Inpatient Hospital Stay: Payer: Medicare Other

## 2020-06-30 ENCOUNTER — Emergency Department: Payer: Medicare Other

## 2020-06-30 ENCOUNTER — Inpatient Hospital Stay
Admission: EM | Admit: 2020-06-30 | Discharge: 2020-07-03 | DRG: 307 | Disposition: A | Discharge status: Home Health | Payer: Medicare Other | Attending: Internal Medicine | Admitting: Internal Medicine

## 2020-06-30 DIAGNOSIS — W19XXXA Unspecified fall, initial encounter: Secondary | ICD-10-CM | POA: Diagnosis not present

## 2020-06-30 DIAGNOSIS — I2510 Atherosclerotic heart disease of native coronary artery without angina pectoris: Secondary | ICD-10-CM | POA: Diagnosis present

## 2020-06-30 DIAGNOSIS — I10 Essential (primary) hypertension: Secondary | ICD-10-CM

## 2020-06-30 DIAGNOSIS — Z79899 Other long term (current) drug therapy: Secondary | ICD-10-CM | POA: Diagnosis not present

## 2020-06-30 DIAGNOSIS — W010XXA Fall on same level from slipping, tripping and stumbling without subsequent striking against object, initial encounter: Secondary | ICD-10-CM | POA: Diagnosis present

## 2020-06-30 DIAGNOSIS — Z20822 Contact with and (suspected) exposure to covid-19: Secondary | ICD-10-CM | POA: Diagnosis present

## 2020-06-30 DIAGNOSIS — I350 Nonrheumatic aortic (valve) stenosis: Secondary | ICD-10-CM | POA: Diagnosis present

## 2020-06-30 DIAGNOSIS — Z801 Family history of malignant neoplasm of trachea, bronchus and lung: Secondary | ICD-10-CM | POA: Diagnosis not present

## 2020-06-30 DIAGNOSIS — Z8739 Personal history of other diseases of the musculoskeletal system and connective tissue: Secondary | ICD-10-CM | POA: Diagnosis not present

## 2020-06-30 DIAGNOSIS — I160 Hypertensive urgency: Secondary | ICD-10-CM | POA: Diagnosis present

## 2020-06-30 DIAGNOSIS — N1832 Chronic kidney disease, stage 3b: Secondary | ICD-10-CM | POA: Diagnosis present

## 2020-06-30 DIAGNOSIS — R079 Chest pain, unspecified: Secondary | ICD-10-CM | POA: Diagnosis not present

## 2020-06-30 DIAGNOSIS — Z8249 Family history of ischemic heart disease and other diseases of the circulatory system: Secondary | ICD-10-CM | POA: Diagnosis not present

## 2020-06-30 DIAGNOSIS — Z66 Do not resuscitate: Secondary | ICD-10-CM | POA: Diagnosis present

## 2020-06-30 DIAGNOSIS — R296 Repeated falls: Secondary | ICD-10-CM | POA: Diagnosis present

## 2020-06-30 DIAGNOSIS — F32A Depression, unspecified: Secondary | ICD-10-CM | POA: Diagnosis present

## 2020-06-30 DIAGNOSIS — Y92009 Unspecified place in unspecified non-institutional (private) residence as the place of occurrence of the external cause: Secondary | ICD-10-CM | POA: Diagnosis not present

## 2020-06-30 DIAGNOSIS — H547 Unspecified visual loss: Secondary | ICD-10-CM

## 2020-06-30 DIAGNOSIS — R55 Syncope and collapse: Secondary | ICD-10-CM | POA: Diagnosis not present

## 2020-06-30 DIAGNOSIS — I480 Paroxysmal atrial fibrillation: Secondary | ICD-10-CM | POA: Diagnosis present

## 2020-06-30 DIAGNOSIS — Z043 Encounter for examination and observation following other accident: Secondary | ICD-10-CM | POA: Diagnosis not present

## 2020-06-30 DIAGNOSIS — E785 Hyperlipidemia, unspecified: Secondary | ICD-10-CM | POA: Diagnosis present

## 2020-06-30 DIAGNOSIS — S51011A Laceration without foreign body of right elbow, initial encounter: Secondary | ICD-10-CM | POA: Diagnosis present

## 2020-06-30 DIAGNOSIS — I5032 Chronic diastolic (congestive) heart failure: Secondary | ICD-10-CM | POA: Diagnosis present

## 2020-06-30 DIAGNOSIS — S0003XA Contusion of scalp, initial encounter: Secondary | ICD-10-CM | POA: Diagnosis present

## 2020-06-30 DIAGNOSIS — I272 Pulmonary hypertension, unspecified: Secondary | ICD-10-CM | POA: Diagnosis present

## 2020-06-30 DIAGNOSIS — N189 Chronic kidney disease, unspecified: Secondary | ICD-10-CM | POA: Diagnosis present

## 2020-06-30 DIAGNOSIS — H548 Legal blindness, as defined in USA: Secondary | ICD-10-CM | POA: Diagnosis present

## 2020-06-30 DIAGNOSIS — I712 Thoracic aortic aneurysm, without rupture: Secondary | ICD-10-CM | POA: Diagnosis present

## 2020-06-30 DIAGNOSIS — R778 Other specified abnormalities of plasma proteins: Secondary | ICD-10-CM | POA: Diagnosis not present

## 2020-06-30 DIAGNOSIS — J8410 Pulmonary fibrosis, unspecified: Secondary | ICD-10-CM

## 2020-06-30 DIAGNOSIS — H543 Unqualified visual loss, both eyes: Secondary | ICD-10-CM | POA: Diagnosis not present

## 2020-06-30 DIAGNOSIS — R220 Localized swelling, mass and lump, head: Secondary | ICD-10-CM | POA: Diagnosis not present

## 2020-06-30 DIAGNOSIS — Z8042 Family history of malignant neoplasm of prostate: Secondary | ICD-10-CM | POA: Diagnosis not present

## 2020-06-30 DIAGNOSIS — N179 Acute kidney failure, unspecified: Secondary | ICD-10-CM | POA: Diagnosis present

## 2020-06-30 DIAGNOSIS — I130 Hypertensive heart and chronic kidney disease with heart failure and stage 1 through stage 4 chronic kidney disease, or unspecified chronic kidney disease: Secondary | ICD-10-CM | POA: Diagnosis present

## 2020-06-30 DIAGNOSIS — J449 Chronic obstructive pulmonary disease, unspecified: Secondary | ICD-10-CM

## 2020-06-30 DIAGNOSIS — F4329 Adjustment disorder with other symptoms: Secondary | ICD-10-CM | POA: Diagnosis not present

## 2020-06-30 LAB — RESPIRATORY PANEL BY RT PCR (FLU A&B, COVID)
Influenza A by PCR: NEGATIVE
Influenza B by PCR: NEGATIVE
SARS Coronavirus 2 by RT PCR: NEGATIVE

## 2020-06-30 LAB — BRAIN NATRIURETIC PEPTIDE: B Natriuretic Peptide: 690.1 pg/mL — ABNORMAL HIGH (ref 0.0–100.0)

## 2020-06-30 LAB — TROPONIN I (HIGH SENSITIVITY)
Troponin I (High Sensitivity): 24 ng/L — ABNORMAL HIGH (ref <18)
Troponin I (High Sensitivity): 22 ng/L — ABNORMAL HIGH (ref <18)
Troponin I (High Sensitivity): 19 ng/L — ABNORMAL HIGH (ref <18)

## 2020-06-30 IMAGING — MR MR MRA CHEST W/ OR W/O CM
7 series · 16 of 16 positions shown · IV contrast (agent unspecified)
Comparison: CT chest [DATE]

CLINICAL DATA: [AGE] female with a fall

EXAM:
MRA CHEST WITH OR WITHOUT CONTRAST
TECHNIQUE: Angiographic images of the chest were obtained using MRA technique
without intravenous contrast.
CONTRAST:  None

[Series 3: t2_haste_cor_p3_mbh · coronal · 5.0mm · 1.25mm/px · 4 of 33 slices shown]
[im 1/33]
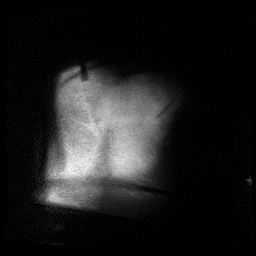
[im 11/33]
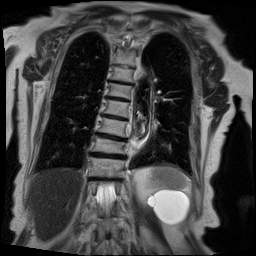
[im 22/33]
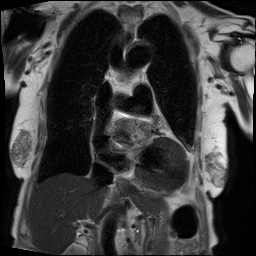
[im 33/33]
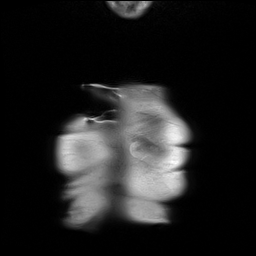

[Series 4: t1_fl2d_tra chest · axial · 5.0mm · 1.04mm/px · z∈[-28,+187]mm · 3 of 37 slices shown]
[im 1/37]
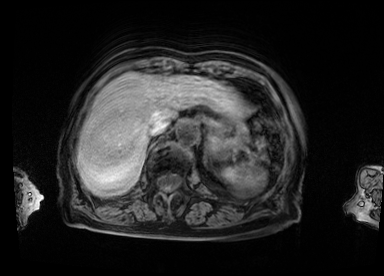
[im 19/37]
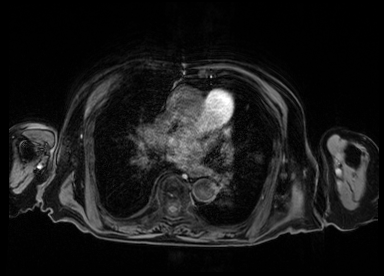
[im 37/37]
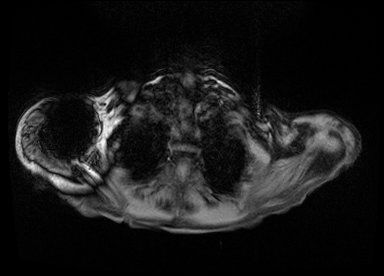

[Series 5: t2_trufi_ax_p3_bh · axial · 5.0mm · 0.78mm/px · z∈[-28,+188]mm · 3 of 37 slices shown]
[im 1/37]
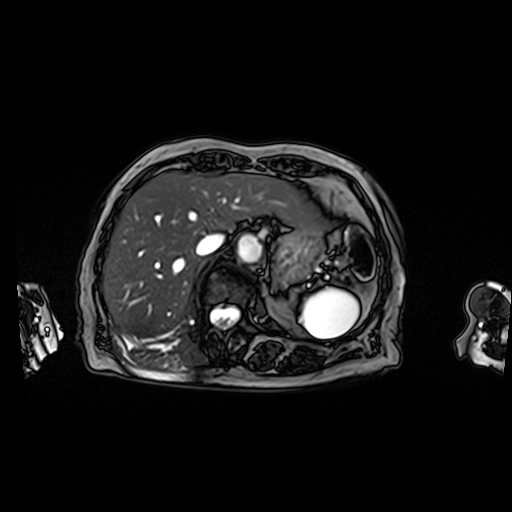
[im 19/37]
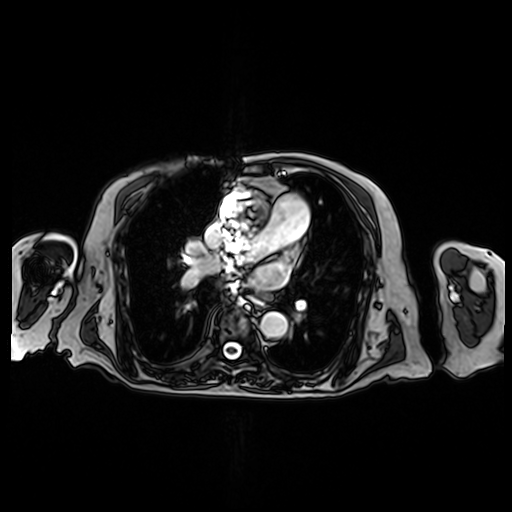
[im 37/37]
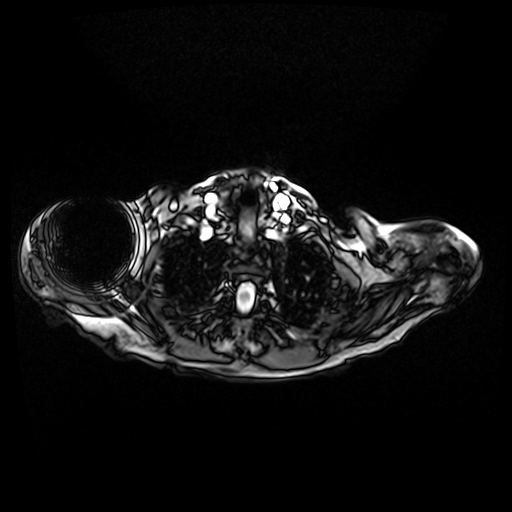

[Series 6: t2_trufi_sag_p3_cine_bh · sagittal · 10.0mm · 0.74mm/px · 1 of 10 slices shown (1 of 3)]
[im 1/10]
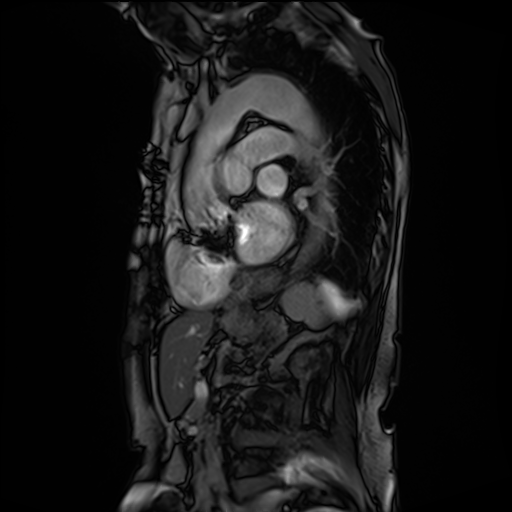

[Series 7: t2_trufi_sag_p3_cine_bh · sagittal · 10.0mm · 0.74mm/px · 1 of 10 slices shown (2 of 3)]
[im 1/10]
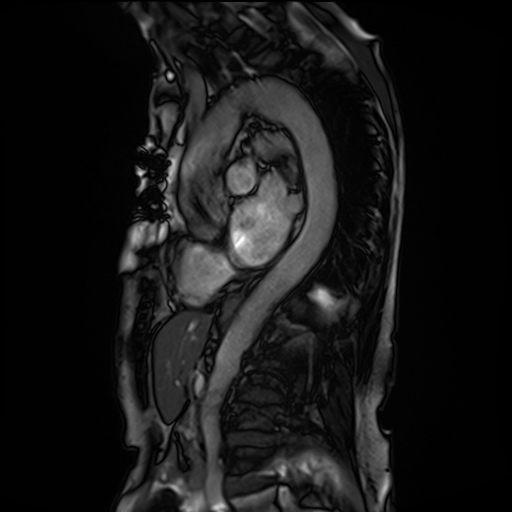

[Series 8: t2_trufi_sag_p3_cine_bh · sagittal · 10.0mm · 0.74mm/px · 1 of 10 slices shown (3 of 3)]
[im 1/10]
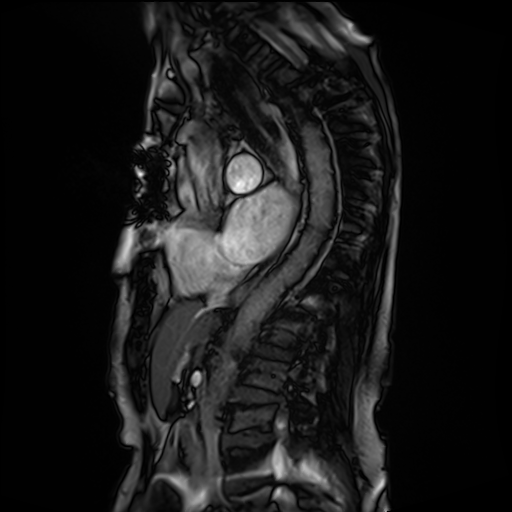

[Series 9: t2_haste_db_tra_bh · axial · 5.0mm · 1.33mm/px · z∈[-31,+184]mm · 3 of 37 slices shown]
[im 1/37]
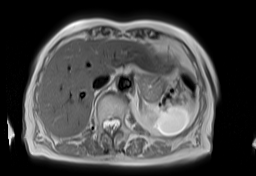
[im 19/37]
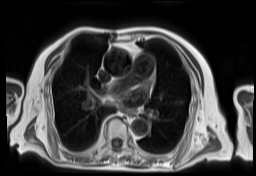
[im 37/37]
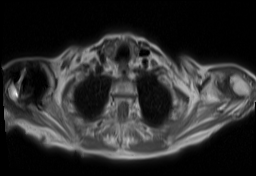

[16 of 16 positions shown; findings below may reference images not displayed]

FINDINGS: VASCULAR

Aorta: Artifact at the aortic valve compatible with aortic valve
repair. Estimated diameter of the ascending aorta 3.6 cm. Distal
thoracic aorta at the hiatus measures 2.3 cm.

No evidence of periaortic fluid or inflammatory change. No evidence
of dissection. Mild atherosclerosis as was seen on prior CT.

Heart: Cardiomegaly. No pericardial fluid. Enlargement of the
right-sided heart chambers, similar to the comparison CT chest.

Pulmonary Arteries:  Main pulmonary artery measures 2.8 cm.

NON-VASCULAR

Mediastinum: No mediastinal adenopathy. Esophagus is not well
evaluated.

Lungs/pleura: No pleural effusion. Signal within the lungs
unremarkable.

Musculoskeletal: Artifact at the right glenohumeral joint secondary
to prior arthroplasty. Visualized thoracic spine unremarkable on the
coronal images.
IMPRESSION: Aortic Atherosclerosis ([5G]-[5G]).

MRA is negative for thoracic aortic aneurysm, with maximum diameter
of the ascending aorta estimated 3.6 cm.

Surgical changes of aortic valve repair.

Cardiomegaly.

## 2020-06-30 IMAGING — MR MR HEAD W/O CM
10 series · 48 of 48 positions shown · non-contrast
Comparison: [DATE]

CLINICAL DATA: Syncope

EXAM:
MRI HEAD WITHOUT CONTRAST
TECHNIQUE: Multiplanar, multiecho pulse sequences of the brain and surrounding
structures were obtained without intravenous contrast.

[Series 2: ax dwi_tracew · axial · 3.0mm · 1.31mm/px · z∈[+22,+173]mm · 8 of 48 slices shown]
[im 1/48]
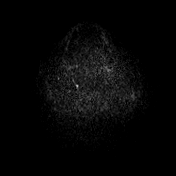
[im 7/48]
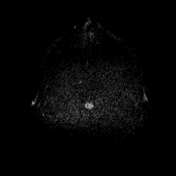
[im 14/48]
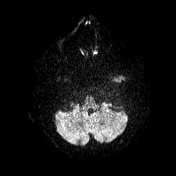
[im 21/48]
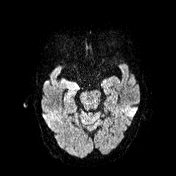
[im 27/48]
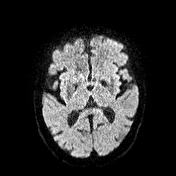
[im 34/48]
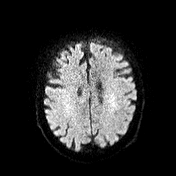
[im 41/48]
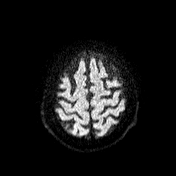
[im 48/48]
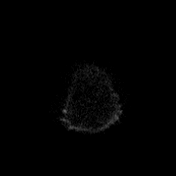

[Series 3: ax dwi_adc · axial · 3.0mm · 1.31mm/px · z∈[+22,+173]mm · 7 of 48 slices shown]
[im 1/48]
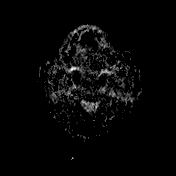
[im 8/48]
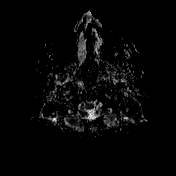
[im 16/48]
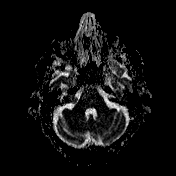
[im 24/48]
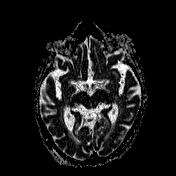
[im 32/48]
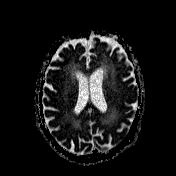
[im 40/48]
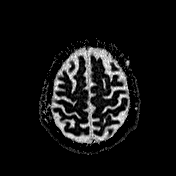
[im 48/48]
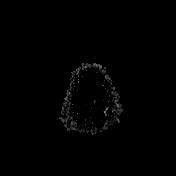

[Series 4: T1 · sagittal · 5.0mm · 0.94mm/px · 3 of 23 slices shown (1 of 2)]
[im 1/23]
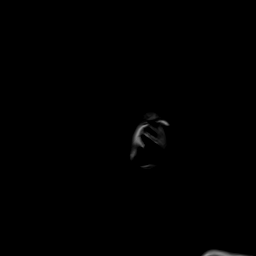
[im 12/23]
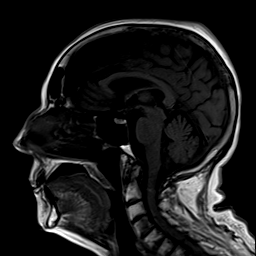
[im 23/23]
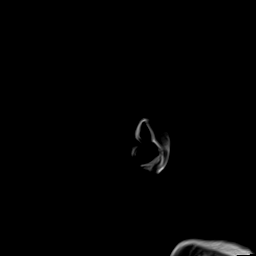

[Series 5: cor dwi_tracew · coronal · 5.0mm · 1.31mm/px · 5 of 38 slices shown]
[im 1/38]
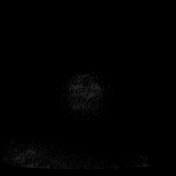
[im 10/38]
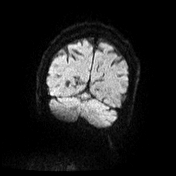
[im 19/38]
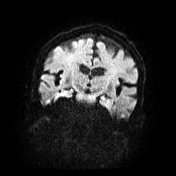
[im 28/38]
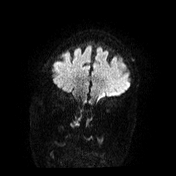
[im 38/38]
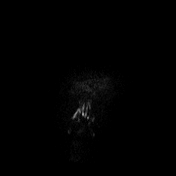

[Series 6: cor dwi_adc · coronal · 5.0mm · 1.31mm/px · 5 of 37 slices shown]
[im 1/37]
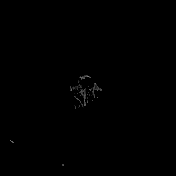
[im 10/37]
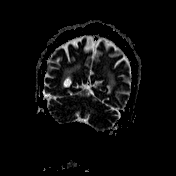
[im 19/37]
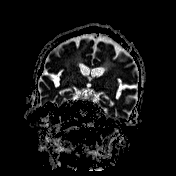
[im 28/37]
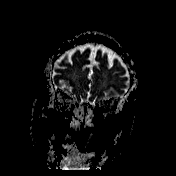
[im 37/37]
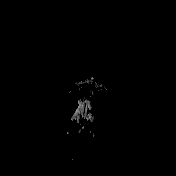

[Series 7: T2 · axial · 5.0mm · 0.45mm/px · z∈[+19,+171]mm · 4 of 27 slices shown (1 of 2)]
[im 1/27]
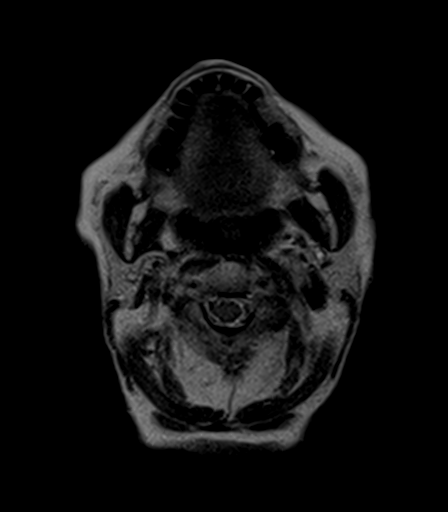
[im 9/27]
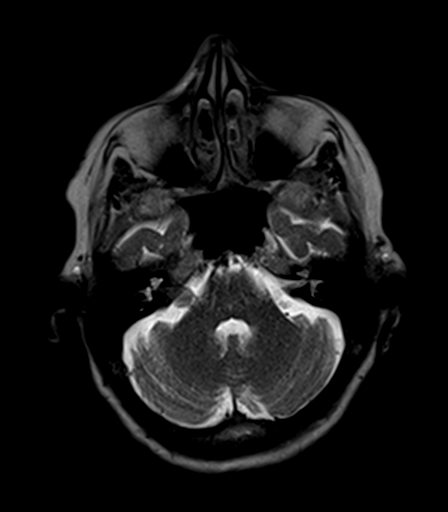
[im 18/27]
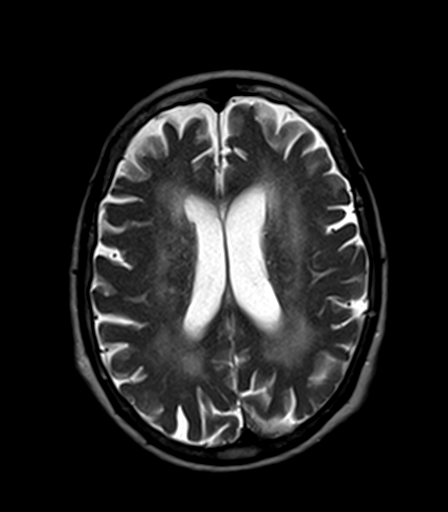
[im 27/27]
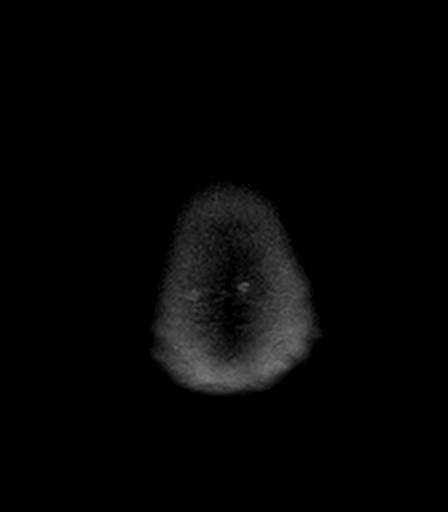

[Series 8: T2-star · axial · 5.0mm · 0.45mm/px · z∈[+19,+171]mm · 4 of 27 slices shown]
[im 1/27]
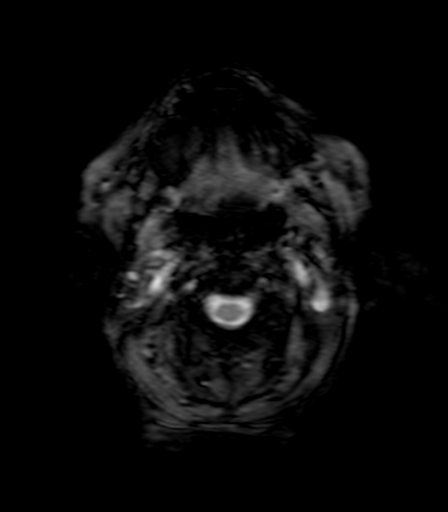
[im 9/27]
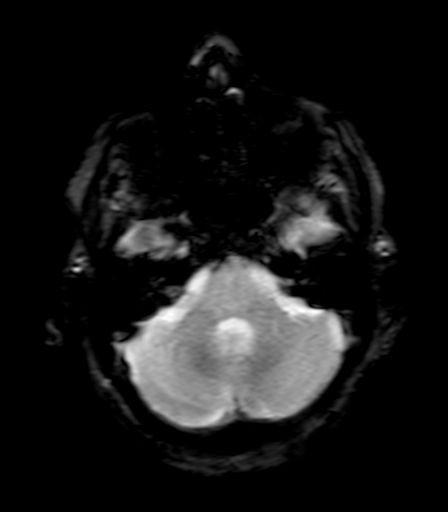
[im 18/27]
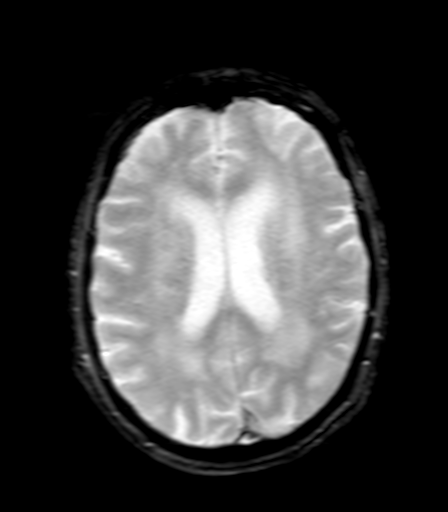
[im 27/27]
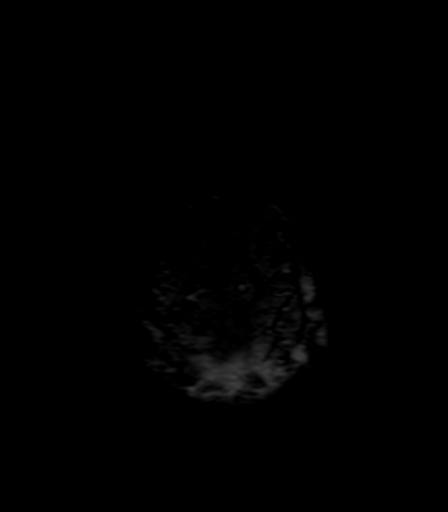

[Series 9: FLAIR · axial · 5.0mm · 1.20mm/px · z∈[+22,+174]mm · 4 of 27 slices shown]
[im 1/27]
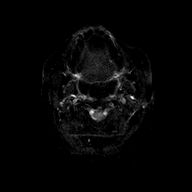
[im 9/27]
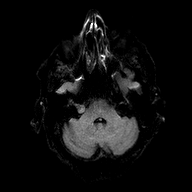
[im 18/27]
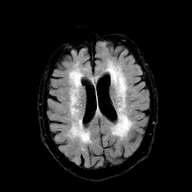
[im 27/27]
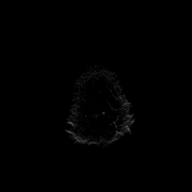

[Series 10: T1 · axial · 5.0mm · 0.90mm/px · z∈[+19,+171]mm · 4 of 27 slices shown (2 of 2)]
[im 1/27]
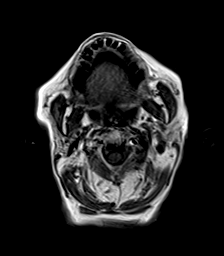
[im 9/27]
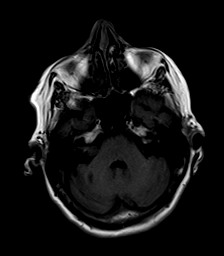
[im 18/27]
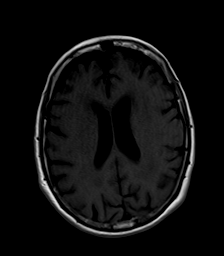
[im 27/27]
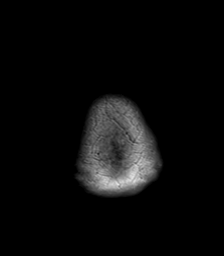

[Series 11: T2 · coronal · 5.0mm · 0.45mm/px · 4 of 31 slices shown (2 of 2)]
[im 1/31]
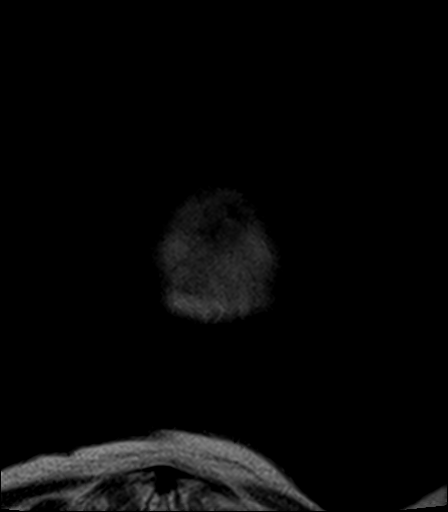
[im 11/31]
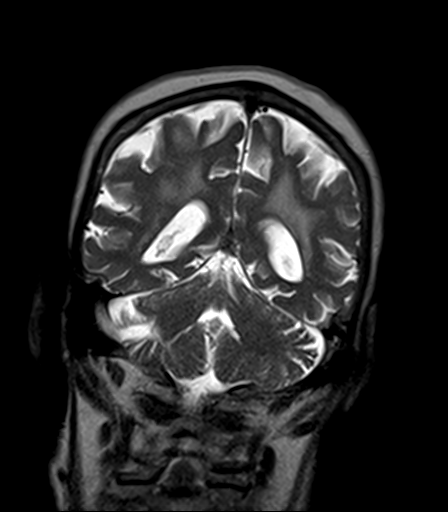
[im 21/31]
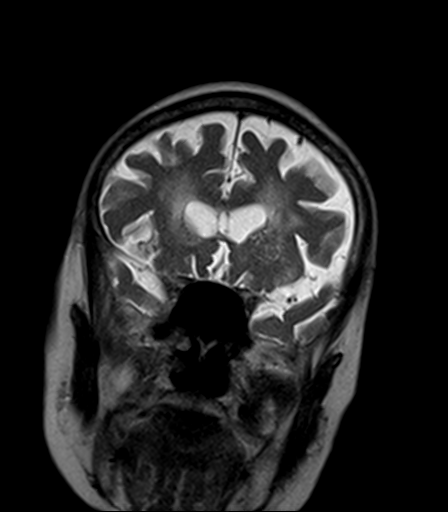
[im 31/31]
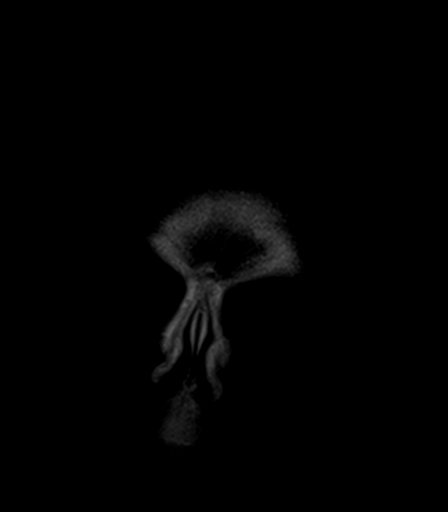

[48 of 48 positions shown; findings below may reference images not displayed]

FINDINGS: Brain: There is no acute infarction or intracranial hemorrhage.
There is no parenchymal mass, mass effect, or edema. There is no
hydrocephalus or extra-axial fluid collection.

There is a mass at the right cerebellopontine angle extending into
the right internal auditory canal. This appears to be similar in
size on these large field-of-view images. Prominence of the
ventricles and sulci reflects mild generalized parenchymal volume
loss. Patchy and confluent areas of T2 hyperintensity in the
supratentorial greater than pontine white matter are nonspecific but
probably reflect moderate to advanced chronic microvascular ischemic
changes.

Vascular: Major vessel flow voids at the skull base are preserved.

Skull and upper cervical spine: Normal marrow signal is preserved.

Sinuses/Orbits: Paranasal sinuses are aerated. Bilateral lens
replacements.

Other: Sella is unremarkable.  Mastoid air cells are clear.
IMPRESSION: No evidence of recent infarction or hemorrhage.

Moderate to advanced chronic microvascular ischemic changes.

Similar size of right vestibular schwannoma.

## 2020-06-30 IMAGING — MR MR MRA ABDOMEN W/ OR W/O CM
7 series · 34 of 48 positions shown · IV contrast (agent unspecified)
Comparison: No prior CT imaging

CLINICAL DATA: [AGE] female with a fall

EXAM:
MRA ABDOMEN AND PELVIS WITHOUT CONTRAST
TECHNIQUE: Multiplanar, multiecho pulse sequences of the abdomen and pelvis
were obtained without intravenous contrast. Angiographic images of
abdomen and pelvis were obtained using MRA technique without
intravenous contrast.
CONTRAST:  None

[Series 6: bSSFP · coronal · 5.0mm · 0.74mm/px · 5 of 35 slices shown (1 of 2)]
[im 1/35]
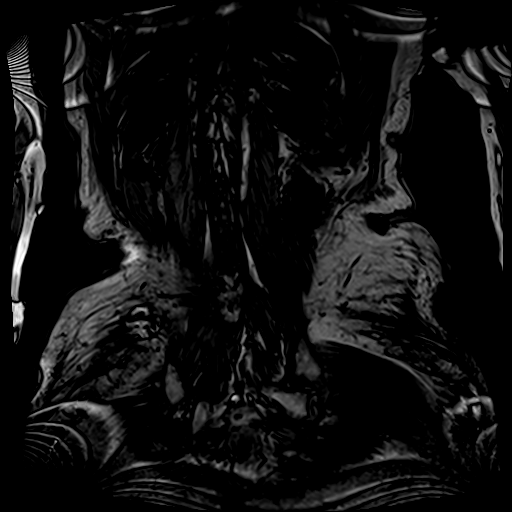
[im 9/35]
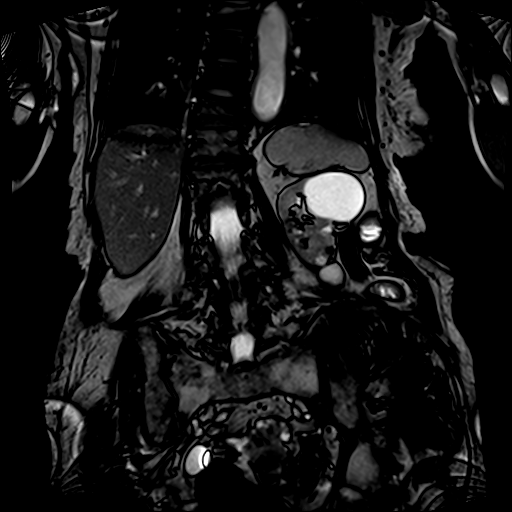
[im 18/35]
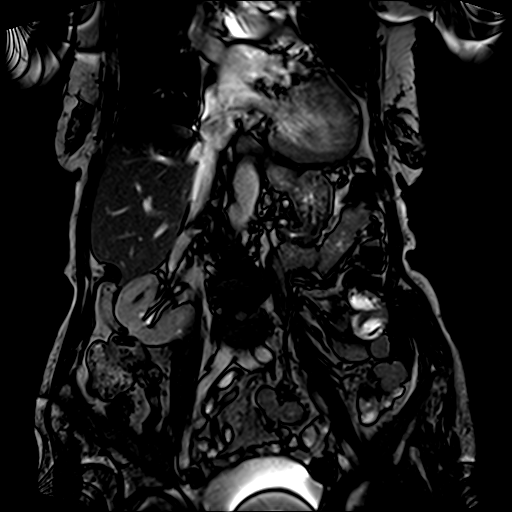
[im 26/35]
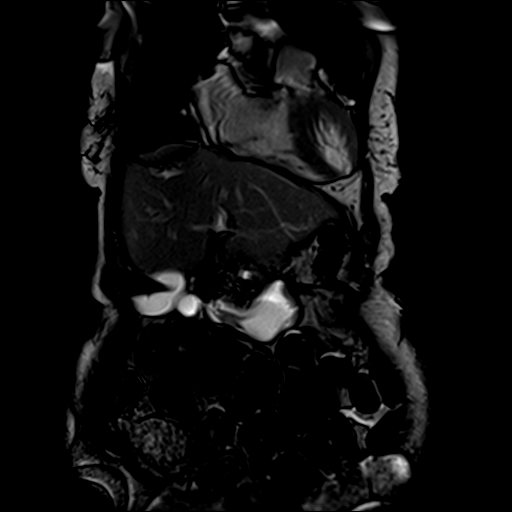
[im 35/35]
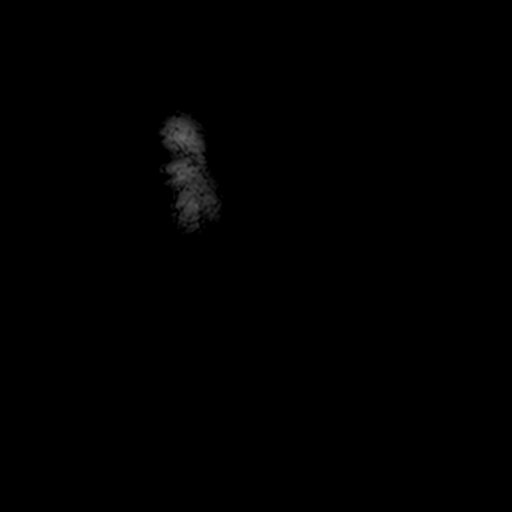

[Series 7: ax haste db · axial · 6.0mm · 1.19mm/px · z∈[-461,-209]mm · 4 of 36 slices shown]
[im 1/36]
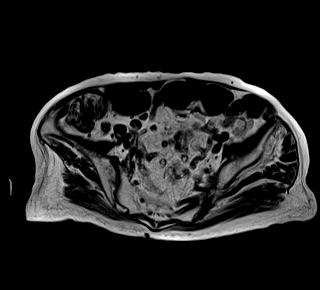
[im 12/36]
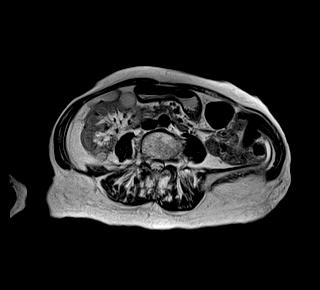
[im 24/36]
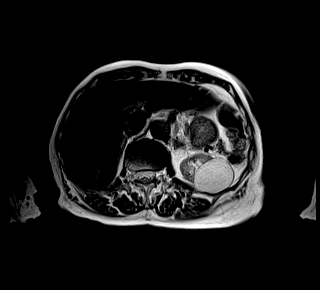
[im 36/36]
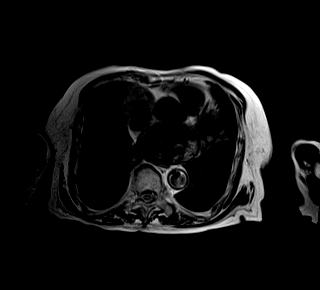

[Series 8: T2 fat-sat · axial · 6.0mm · 1.19mm/px · z∈[-451,-199]mm · 4 of 36 slices shown]
[im 1/36]
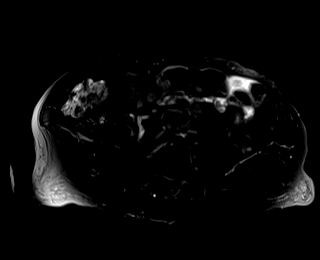
[im 12/36]
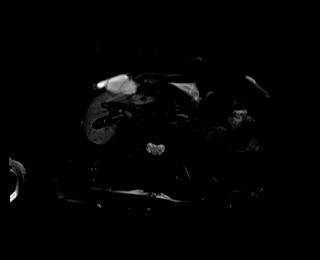
[im 24/36]
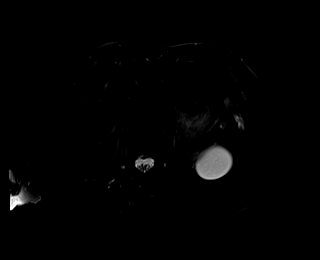
[im 36/36]
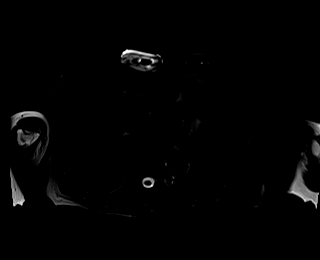

[Series 9: bSSFP · axial · 6.0mm · 0.74mm/px · z∈[-444,-210]mm · 4 of 40 slices shown (2 of 2)]
[im 1/40]
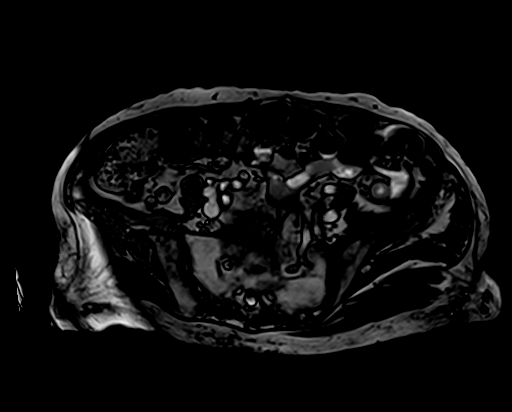
[im 14/40]
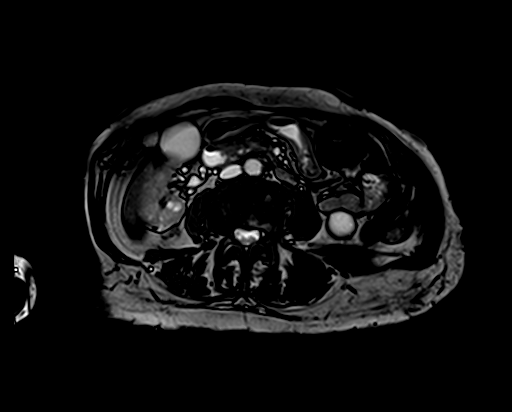
[im 27/40]
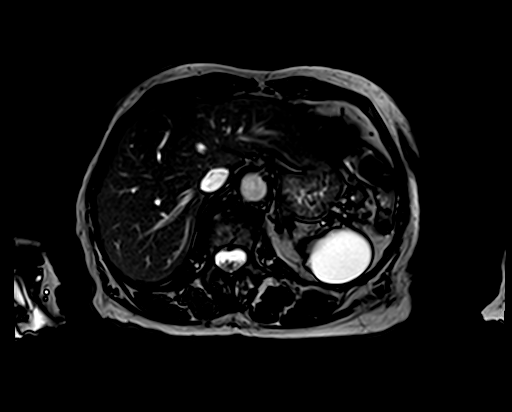
[im 40/40]
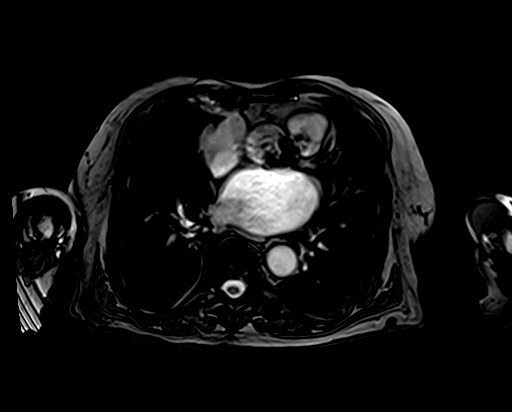

[Series 10: t1_vibe_fs_tra_p4_bh_pre · axial · 3.0mm · 1.19mm/px · z∈[-455,-194]mm · 8 of 88 slices shown]
[im 1/88]
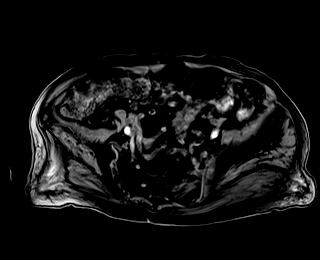
[im 10/88]
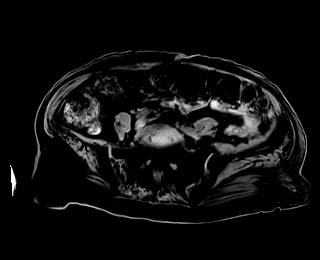
[im 30/88]
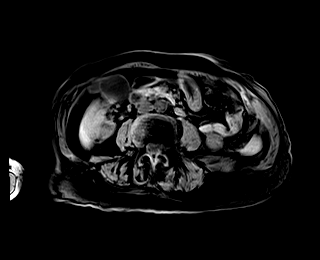
[im 39/88]
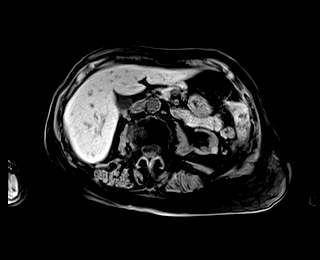
[im 49/88]
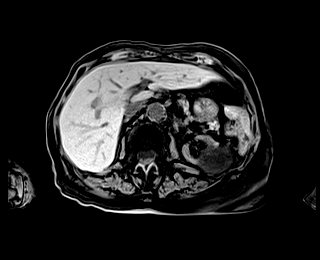
[im 59/88]
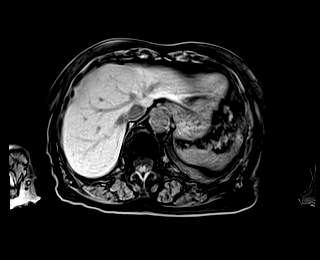
[im 78/88]
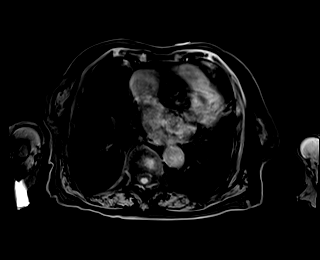
[im 88/88]
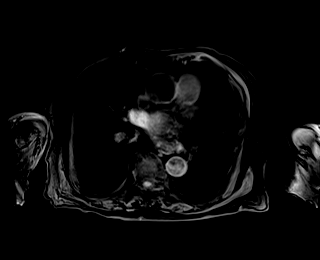

[Series 11: T1 dynamic · coronal · 3.0mm · 1.31mm/px · 8 of 72 slices shown]
[im 1/72]
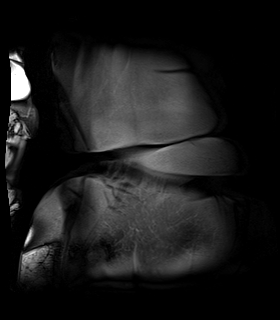
[im 11/72]
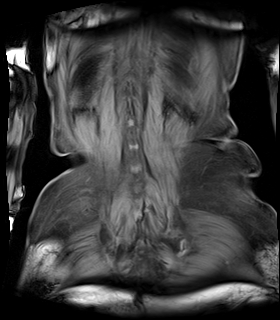
[im 21/72]
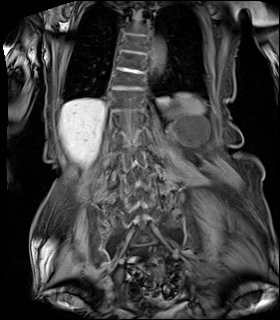
[im 31/72]
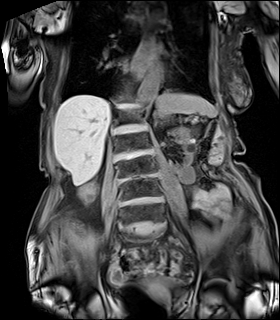
[im 41/72]
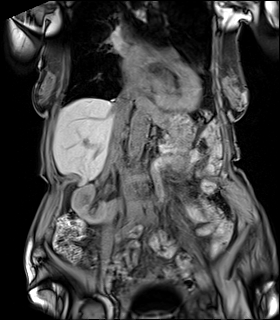
[im 51/72]
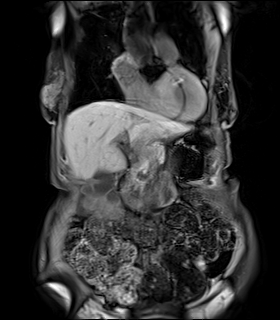
[im 61/72]
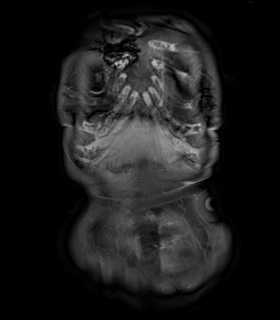
[im 72/72]
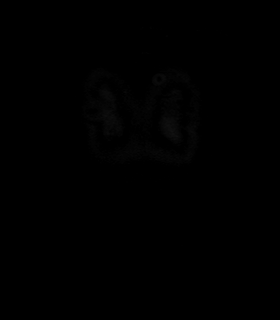

[Series 12: angio_fl3d_cor_pre · coronal · 1.1mm · 1.17mm/px · 1 of 120 slices shown]
[im 1/120]
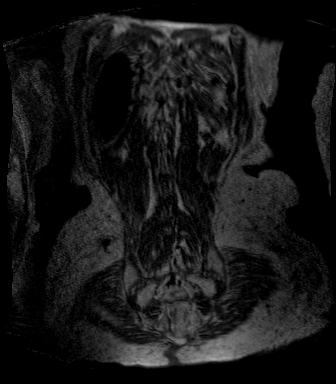

[34 of 48 positions shown; findings below may reference images not displayed]

FINDINGS: MRA abdomen and pelvis

Vascular:

Aorta: Diameter at the hiatus measures 2.3 cm. Diameter of the
juxtarenal aorta measures 1.6 cm. Infrarenal abdominal aorta at the
bifurcation measures 13 mm. There is no inflammatory signal in the
periaortic region with fat signal maintained. Scattered signal loss
within the aortic wall compatible with calcified atherosclerotic
plaque. No evidence of dissection.

Celiac artery: Flow signal maintained in the proximal celiac artery.
Study is nondiagnostic for evaluation of any possible stenosis at
the origin.

SMA: Flow signal maintained in the SMA just beyond the origin. Study
is nondiagnostic for evaluation of any possible stenosis at the
origin.

IMA: Study is nondiagnostic for evaluation of patency of the IMA
origin.

Renal arteries: Proximal left and right renal arteries maintain flow
signal. MRI resolution is below that that would allow quantification
of any possible stenosis.

Iliac arteries:

Flow signal maintained within the bilateral common iliac arteries,
hypogastric arteries, external iliac arteries. Mild tortuosity of
the bilateral iliac arterial system. No significant atherosclerotic
plaque.

Venous: Unremarkable systemic venous system.

Portal veins: Flow signal maintained within the main portal vein,
right left portal vein, splenic vein. Normal flow signal within the
a hepatic veins

Nonvascular:

Unremarkable signal of the liver and gallbladder.

Pancreas: Focal T2 signal within the pancreatic head measuring 8 mm.
Series 8 of the abdominal MRI, image 23.

Right kidney: Malrotation of the right kidney. Multiple T2 cystic
lesions within the right kidney. None of these are completely
characterized in the absence of contrast. No hydronephrosis.

Left kidney: Asymmetric size the left kidney compared to the right,
with the left smaller. Multiple cystic lesions with T2 intensity,
the largest on the upper pole measuring 4.9 cm. None of these are
completely characterized in the absence of contrast.

Large colonic stool burden.  No evidence of bowel obstruction.

Rounded mass at the fundus of the uterus measuring 23 mm. The low
signal is suggestive of partially calcified fibroid. Fluid signal
within the endometrial canal.

Musculoskeletal: Relatively uniform marrow signal.
IMPRESSION: MR is negative for abdominal aortic aneurysm.

Aortic Atherosclerosis ([3G]-[3G]).

Large colonic stool burden.  No evidence of obstruction.

Cystic lesion in the head of the pancreas measures 8 mm.
Differential diagnosis includes pseudocyst as well as cystic
neoplasm. Given the size, a follow-up MRI protocol in 1 year is
reasonable.

Bilateral renal cystic lesions, none of which are completely
characterized in the absence of contrast. These may be better
assessed by either a follow-up ultrasound or potentially a future
MRI, as above.

Fluid signal within the endometrial canal. Referral for gynecologic
evaluation recommended, as endometrial malignancy is not excluded.

## 2020-06-30 IMAGING — MR MR MRA PELVIS W/ OR W/O CM
5 series · 41 of 48 positions shown · IV contrast (agent unspecified)
Comparison: No prior CT imaging

CLINICAL DATA: [AGE] female with a fall

EXAM:
MRA ABDOMEN AND PELVIS WITHOUT CONTRAST
TECHNIQUE: Multiplanar, multiecho pulse sequences of the abdomen and pelvis
were obtained without intravenous contrast. Angiographic images of
abdomen and pelvis were obtained using MRA technique without
intravenous contrast.
CONTRAST:  None

[Series 4: t1_fl2d_tra_p2_mbh · axial · 6.0mm · 1.48mm/px · z∈[-592,-368]mm · 5 of 32 slices shown]
[im 1/32]
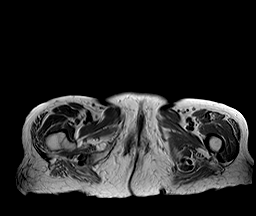
[im 8/32]
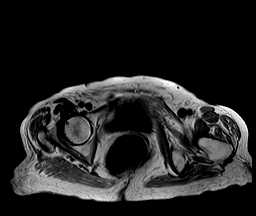
[im 16/32]
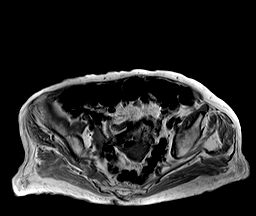
[im 24/32]
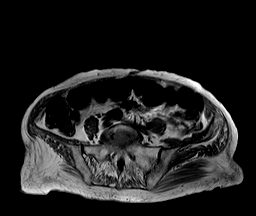
[im 32/32]
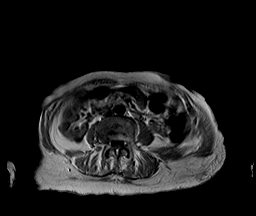

[Series 5: bSSFP · axial · 6.0mm · 0.74mm/px · z∈[-607,-384]mm · 4 of 32 slices shown]
[im 1/32]
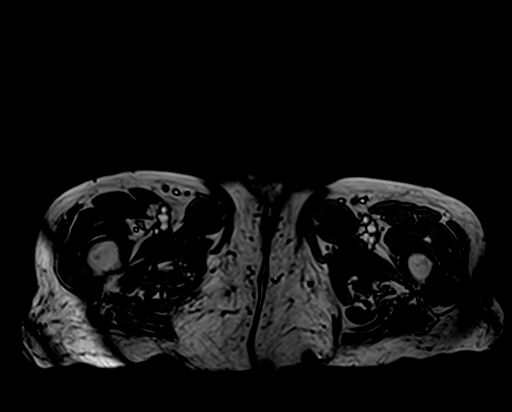
[im 11/32]
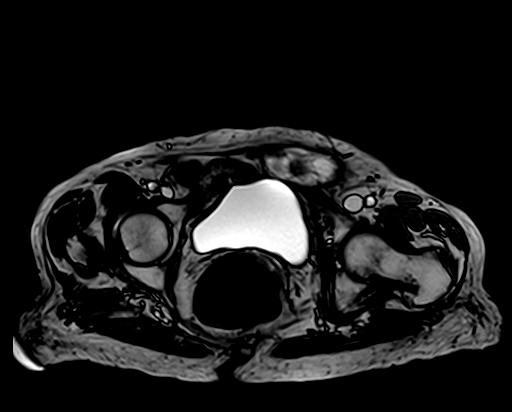
[im 21/32]
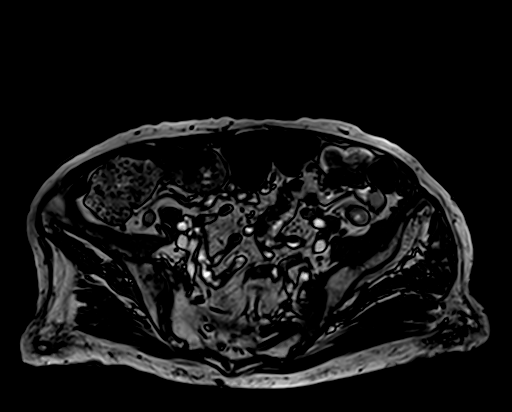
[im 32/32]
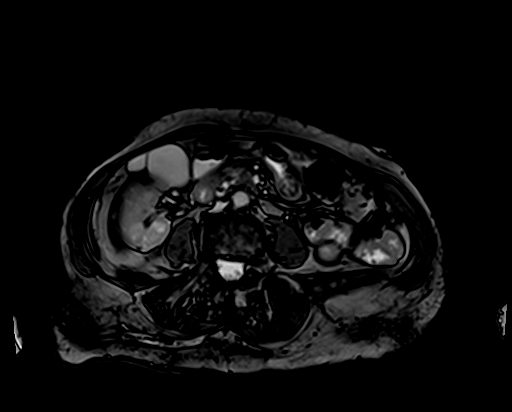

[Series 6: T1 dynamic · axial · 3.0mm · 1.19mm/px · z∈[-619,-383]mm · 11 of 80 slices shown (1 of 2)]
[im 1/80]
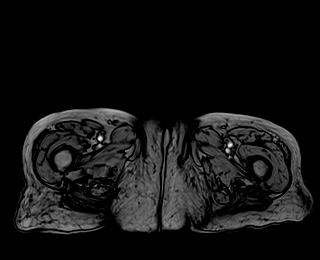
[im 8/80]
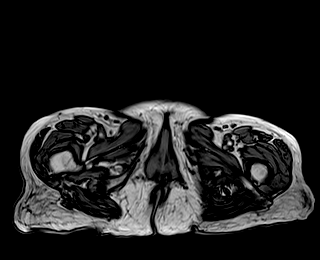
[im 16/80]
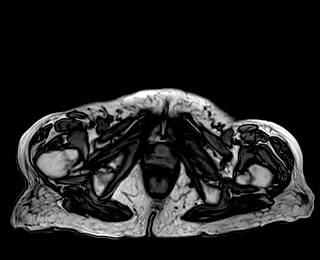
[im 24/80]
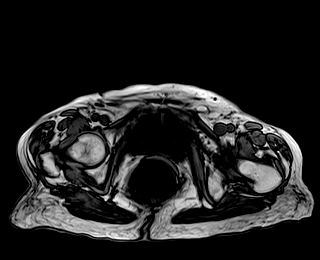
[im 32/80]
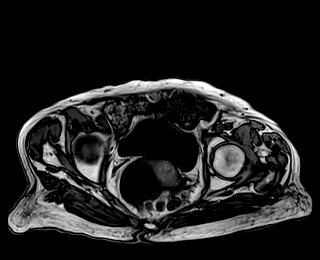
[im 40/80]
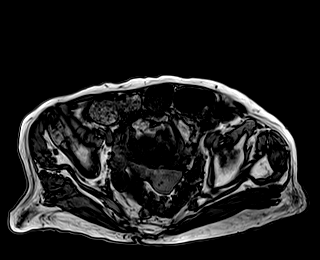
[im 48/80]
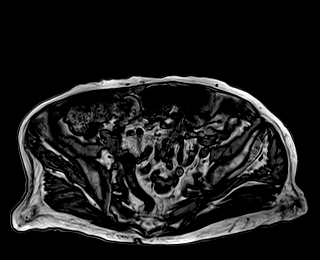
[im 56/80]
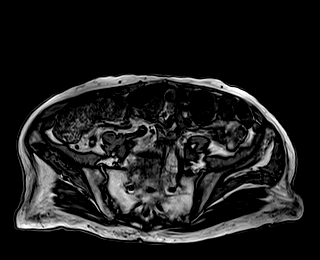
[im 64/80]
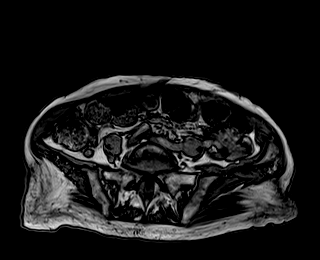
[im 72/80]
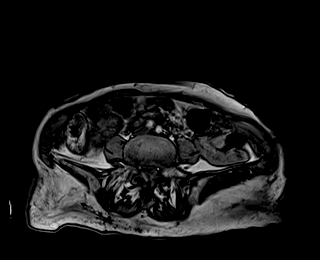
[im 80/80]
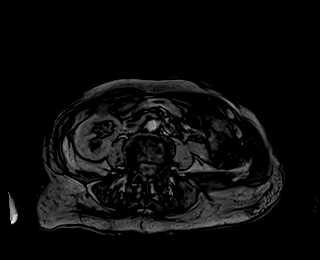

[Series 7: T1 dynamic · coronal · 3.0mm · 1.31mm/px · 10 of 72 slices shown (2 of 2)]
[im 1/72]
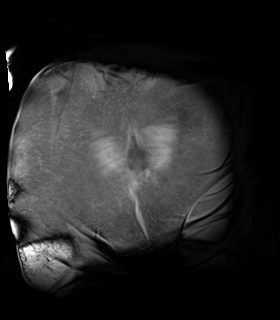
[im 8/72]
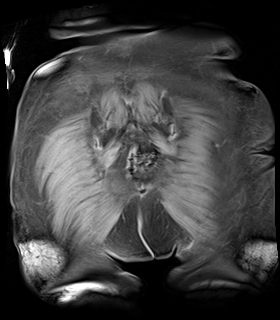
[im 16/72]
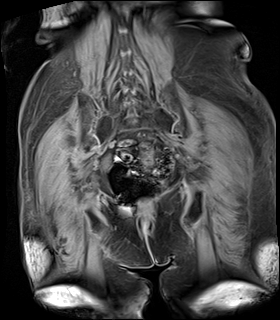
[im 24/72]
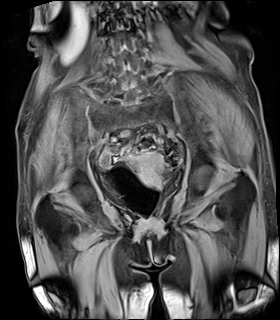
[im 32/72]
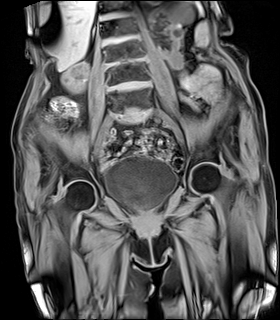
[im 40/72]
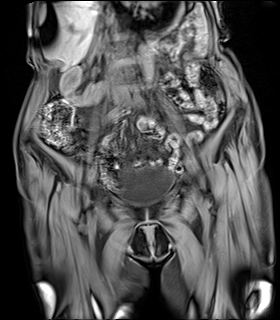
[im 48/72]
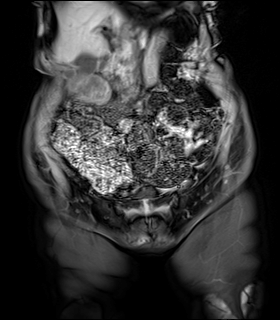
[im 56/72]
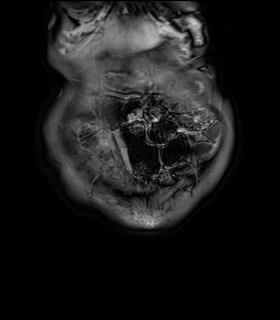
[im 64/72]
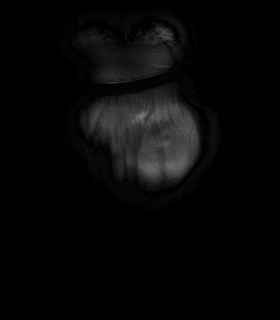
[im 72/72]
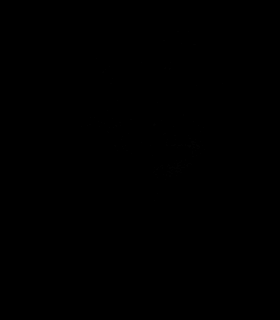

[Series 8: angio_fl3d_cor_pre · coronal · 1.1mm · 1.17mm/px · 11 of 128 slices shown]
[im 1/128]
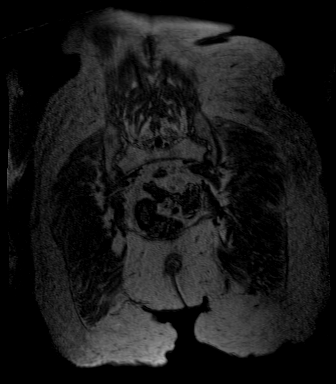
[im 8/128]
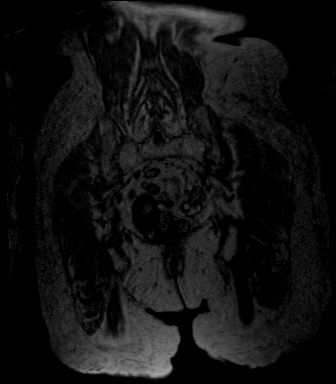
[im 15/128]
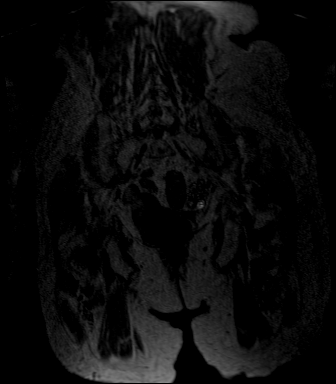
[im 23/128]
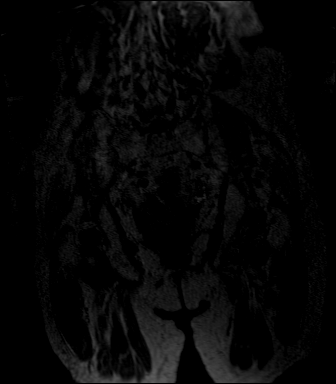
[im 30/128]
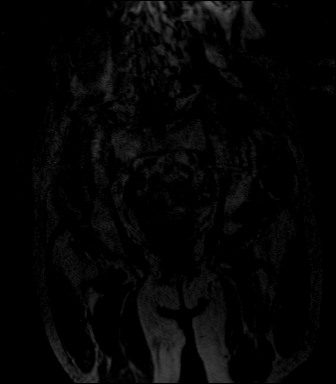
[im 38/128]
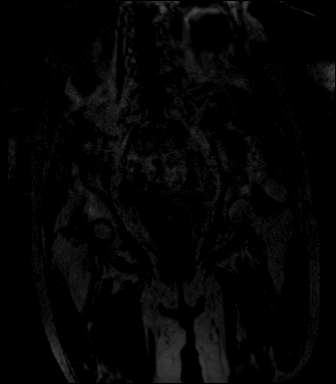
[im 53/128]
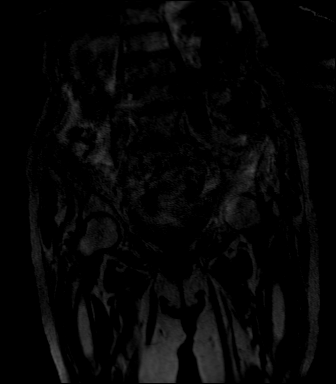
[im 75/128]
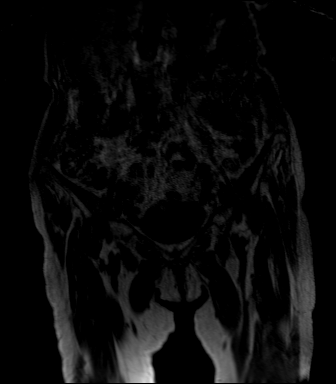
[im 90/128]
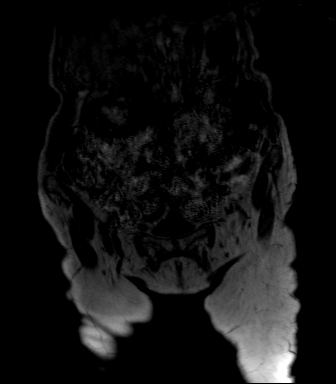
[im 105/128]
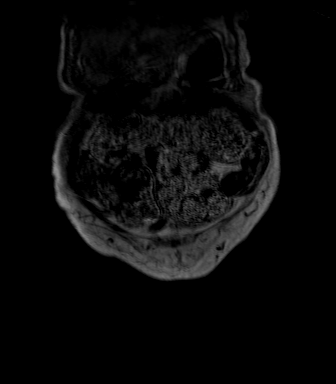
[im 120/128]
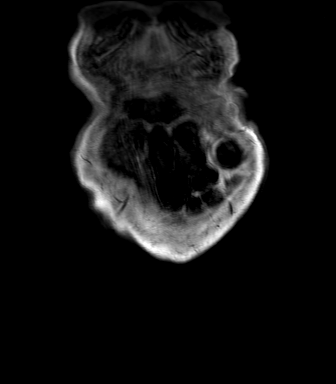

[41 of 48 positions shown; findings below may reference images not displayed]

FINDINGS: MRA abdomen and pelvis

Vascular:

Aorta: Diameter at the hiatus measures 2.3 cm. Diameter of the
juxtarenal aorta measures 1.6 cm. Infrarenal abdominal aorta at the
bifurcation measures 13 mm. There is no inflammatory signal in the
periaortic region with fat signal maintained. Scattered signal loss
within the aortic wall compatible with calcified atherosclerotic
plaque. No evidence of dissection.

Celiac artery: Flow signal maintained in the proximal celiac artery.
Study is nondiagnostic for evaluation of any possible stenosis at
the origin.

SMA: Flow signal maintained in the SMA just beyond the origin. Study
is nondiagnostic for evaluation of any possible stenosis at the
origin.

IMA: Study is nondiagnostic for evaluation of patency of the IMA
origin.

Renal arteries: Proximal left and right renal arteries maintain flow
signal. MRI resolution is below that that would allow quantification
of any possible stenosis.

Iliac arteries:

Flow signal maintained within the bilateral common iliac arteries,
hypogastric arteries, external iliac arteries. Mild tortuosity of
the bilateral iliac arterial system. No significant atherosclerotic
plaque.

Venous: Unremarkable systemic venous system.

Portal veins: Flow signal maintained within the main portal vein,
right left portal vein, splenic vein. Normal flow signal within the
a hepatic veins

Nonvascular:

Unremarkable signal of the liver and gallbladder.

Pancreas: Focal T2 signal within the pancreatic head measuring 8 mm.
Series 8 of the abdominal MRI, image 23.

Right kidney: Malrotation of the right kidney. Multiple T2 cystic
lesions within the right kidney. None of these are completely
characterized in the absence of contrast. No hydronephrosis.

Left kidney: Asymmetric size the left kidney compared to the right,
with the left smaller. Multiple cystic lesions with T2 intensity,
the largest on the upper pole measuring 4.9 cm. None of these are
completely characterized in the absence of contrast.

Large colonic stool burden.  No evidence of bowel obstruction.

Rounded mass at the fundus of the uterus measuring 23 mm. The low
signal is suggestive of partially calcified fibroid. Fluid signal
within the endometrial canal.

Musculoskeletal: Relatively uniform marrow signal.
IMPRESSION: MR is negative for abdominal aortic aneurysm.

Aortic Atherosclerosis ([3G]-[3G]).

Large colonic stool burden.  No evidence of obstruction.

Cystic lesion in the head of the pancreas measures 8 mm.
Differential diagnosis includes pseudocyst as well as cystic
neoplasm. Given the size, a follow-up MRI protocol in 1 year is
reasonable.

Bilateral renal cystic lesions, none of which are completely
characterized in the absence of contrast. These may be better
assessed by either a follow-up ultrasound or potentially a future
MRI, as above.

Fluid signal within the endometrial canal. Referral for gynecologic
evaluation recommended, as endometrial malignancy is not excluded.

## 2020-06-30 MED ORDER — ACETAMINOPHEN 325 MG PO TABS: 650.0000 mg | ORAL_TABLET | Freq: Four times a day (QID) | ORAL | Status: DC | PRN

## 2020-06-30 MED ORDER — DM-GUAIFENESIN ER 30-600 MG PO TB12: 1.0000 | ORAL_TABLET | Freq: Two times a day (BID) | ORAL | Status: DC | PRN

## 2020-06-30 MED ORDER — HYDRALAZINE HCL 25 MG PO TABS
25.0000 mg | ORAL_TABLET | Freq: Two times a day (BID) | ORAL | Status: DC
  Administered 2020-06-30 – 2020-07-03 (×7): 25 mg via ORAL
  Filled 2020-06-30 (×7): qty 1

## 2020-06-30 MED ORDER — NITROGLYCERIN 2 % TD OINT
1.0000 [in_us] | TOPICAL_OINTMENT | Freq: Once | TRANSDERMAL | Status: AC
  Administered 2020-06-30: 1 [in_us] via TOPICAL
  Filled 2020-06-30: qty 1

## 2020-06-30 MED ORDER — ACETAMINOPHEN 325 MG PO TABS
650.0000 mg | ORAL_TABLET | Freq: Once | ORAL | Status: AC
  Administered 2020-06-30: 650 mg via ORAL
  Filled 2020-06-30: qty 2

## 2020-06-30 MED ORDER — BUDESONIDE 0.5 MG/2ML IN SUSP
0.5000 mg | Freq: Every day | RESPIRATORY_TRACT | Status: DC
  Administered 2020-06-30 – 2020-07-02 (×3): 0.5 mg via RESPIRATORY_TRACT
  Filled 2020-06-30 (×3): qty 2

## 2020-06-30 MED ORDER — HYDRALAZINE HCL 10 MG PO TABS
10.0000 mg | ORAL_TABLET | Freq: Once | ORAL | Status: AC
  Administered 2020-06-30: 10 mg via ORAL
  Filled 2020-06-30: qty 1

## 2020-06-30 MED ORDER — ALLOPURINOL 100 MG PO TABS
100.0000 mg | ORAL_TABLET | Freq: Every day | ORAL | Status: DC
  Administered 2020-06-30 – 2020-07-03 (×4): 100 mg via ORAL
  Filled 2020-06-30 (×4): qty 1

## 2020-06-30 MED ORDER — METOPROLOL SUCCINATE ER 50 MG PO TB24
50.0000 mg | ORAL_TABLET | Freq: Every day | ORAL | Status: DC
  Administered 2020-07-01 – 2020-07-03 (×3): 50 mg via ORAL
  Filled 2020-06-30 (×3): qty 1

## 2020-06-30 MED ORDER — SIMVASTATIN 20 MG PO TABS
20.0000 mg | ORAL_TABLET | Freq: Every day | ORAL | Status: DC
  Administered 2020-06-30 – 2020-07-02 (×3): 20 mg via ORAL
  Filled 2020-06-30 (×3): qty 1

## 2020-06-30 MED ORDER — ASPIRIN EC 81 MG PO TBEC
81.0000 mg | DELAYED_RELEASE_TABLET | Freq: Every day | ORAL | Status: DC
  Administered 2020-06-30 – 2020-07-03 (×4): 81 mg via ORAL
  Filled 2020-06-30 (×4): qty 1

## 2020-06-30 MED ORDER — SODIUM CHLORIDE 0.9 % IV SOLN: INTRAVENOUS | Status: DC

## 2020-06-30 MED ORDER — TRAZODONE HCL 50 MG PO TABS: 25.0000 mg | ORAL_TABLET | Freq: Every evening | ORAL | Status: DC | PRN

## 2020-06-30 MED ORDER — ESCITALOPRAM OXALATE 10 MG PO TABS
20.0000 mg | ORAL_TABLET | Freq: Every day | ORAL | Status: DC
  Administered 2020-06-30 – 2020-07-03 (×4): 20 mg via ORAL
  Filled 2020-06-30 (×4): qty 2

## 2020-06-30 MED ORDER — ALBUTEROL SULFATE HFA 108 (90 BASE) MCG/ACT IN AERS
2.0000 | INHALATION_SPRAY | RESPIRATORY_TRACT | Status: DC | PRN
  Filled 2020-06-30: qty 6.7

## 2020-06-30 MED ORDER — HYDRALAZINE HCL 20 MG/ML IJ SOLN
5.0000 mg | INTRAMUSCULAR | Status: DC | PRN
  Filled 2020-06-30: qty 1

## 2020-06-30 MED ORDER — IPRATROPIUM-ALBUTEROL 0.5-2.5 (3) MG/3ML IN SOLN
3.0000 mL | Freq: Four times a day (QID) | RESPIRATORY_TRACT | Status: DC
  Administered 2020-06-30 – 2020-07-01 (×2): 3 mL via RESPIRATORY_TRACT
  Filled 2020-06-30 (×3): qty 3

## 2020-06-30 MED ORDER — ONDANSETRON HCL 4 MG/2ML IJ SOLN: 4.0000 mg | Freq: Three times a day (TID) | INTRAMUSCULAR | Status: DC | PRN

## 2020-06-30 MED ORDER — LOSARTAN POTASSIUM 50 MG PO TABS
25.0000 mg | ORAL_TABLET | Freq: Once | ORAL | Status: AC
  Administered 2020-06-30: 25 mg via ORAL
  Filled 2020-06-30: qty 1

## 2020-06-30 NOTE — Evaluation (Signed)
Occupational Therapy Evaluation Patient Details Name: Marilyn Oconnell MRN: [id number] DOB: 07-10-1924 Today's Date: 06/30/2020    History of Present Illness Pt is a 86yo F admitted to the ED on 06/30/20 for syncopal event that resulted in a fall; pt hit head, R elbow, and R hand. Pt with significant PMH including: HLD, CKD (stage IIIb), lung fibrosis, depression, HTN, CAD, COPD, elevated troponin, diastolic CHF, and A-fib. Imaging significant for right vestibular shwannoma and bil renal cystic lesions.   Clinical Impression   Marilyn Oconnell was seen for OT evaluation this date. Prior to hospital admission, pt was living alone in a 1 level home with 5 STE. Per chart, pt home has been customized to be accessible for visual impairments. Pt reports she lives nearby her niece who visits regularly and has in-home help 7 days/week who assist with bathing, dressing and house work.  Currently pt demonstrates impairments as described below (See OT problem list) which functionally limit her ability to perform ADL/self-care tasks. Pt currently requires MOD A for LB ADL management, MIN A to CGA for bed/functional mobility, and set-up/supervision for seated UB grooming, dressing, and self-feeding.  Pt would benefit from skilled OT services to address noted impairments and functional limitations (see below for any additional details) in order to maximize safety and independence while minimizing falls risk and caregiver burden. Upon hospital discharge, recommend STR to maximize pt safety and return to PLOF.        Follow Up Recommendations  SNF;Supervision/Assistance - 24 hour (HHOT if pt declines SNF)    Equipment Recommendations       Recommendations for Other Services       Precautions / Restrictions Precautions Precautions: Fall Precaution Comments: High Fall Restrictions Weight Bearing Restrictions: No      Mobility Bed Mobility Overal bed mobility: Needs Assistance Bed Mobility: Supine to Sit;Sit  to Supine     Supine to sit: Min guard Sit to supine: Min guard      Transfers                 General transfer comment: Deferred. Pt endorses fatigue.    Balance Overall balance assessment: Needs assistance Sitting-balance support: No upper extremity supported;Feet unsupported Sitting balance-Leahy Scale: Fair Sitting balance - Comments: Fair seated balance at EOB without BLE/BUE support; pt demonstrated slight posterior lean, requiring CGA for safety. Postural control: Posterior lean     Standing balance comment: Not tested.                           ADL either performed or assessed with clinical judgement   ADL Overall ADL's : Needs assistance/impaired                                       General ADL Comments: Pt functionally limited by generalized weakness, decreased activity tolerance, impaired balance, and impaired vision. She does require assist at baseline, however, assistance level increased from baseline at this time. Anticipate MOD A for LB ADL management in seated position. Set-up/supervision for safety with self feeding, grooming, and UB ADL management, and MIN A for functional mobility.     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 Pt denies pain at rest. Pain Descriptors / Indicators: Aching;Discomfort Pain Intervention(s): Limited activity within patient's tolerance     Hand Dominance Right   Extremity/Trunk Assessment Upper Extremity Assessment Upper Extremity Assessment: Generalized weakness;RUE deficits/detail RUE Deficits / Details: RUE bandages at hand and elbow, imaging negative for acute abnormality/fracture. Pt endorses it is mildly pain  limited. Grip 3/5, FMC slowed but WFL, Shoulder flexion limited, pt able to achieve ~100 degrees. RUE: Unable to fully assess due to pain   Lower Extremity Assessment Lower Extremity Assessment: Generalized weakness;Defer to PT evaluation   Cervical / Trunk Assessment Cervical / Trunk Assessment: Kyphotic   Communication Communication Communication: HOH   Cognition Arousal/Alertness: Awake/alert Behavior During Therapy: WFL for tasks assessed/performed Overall Cognitive Status: Within Functional Limits for tasks assessed                                 General Comments: Pt oriented to person, place, basic situation, and time. She is able to follow 1-step VCs consistently during session.   General Comments  Pt noted with minimal bleeding from proximal portion of her R elbow bandage. RN notified/aware. Vitals stable t/o session.    Exercises Other Exercises Other Exercises: Pt educated on role of OT in acute setting, safe use of AE/DME for ADL managment, falls prevention strategies for home and hospital, and medication management strategies. Would benefit from further reinforcement.   Shoulder Instructions      Home Living Family/patient expects to be discharged to:: Private residence Living Arrangements: Alone Available Help at Discharge: Family;Personal care attendant;Available 24 hours/day;Other (Comment) (Pt more alert/oriented this PM states she does have PCA 7 days/week in the am. Niece assists in PM PRN.) Type of Home: House Home Access: Stairs to enter [employer] of Steps: 5 steps, shortened/blind-friendly Entrance Stairs-Rails: Can reach both;Left;Right Home Layout: One level     Bathroom Shower/Tub: Occupational psychologist: Handicapped height Bathroom Accessibility: Yes How Accessible: Accessible via walker Home Equipment: [place] - single point;Shower seat - built in;Grab bars - tub/shower;Hand held shower head;Grab bars - toilet    Additional Comments: per previuos documentation: Neice reports house was fully adapted including visual tape strips, bathroom, and entryway      Prior Functioning/Environment Level of Independence: Needs assistance  Gait / Transfers Assistance Needed: Pt reports requiring supervision for short distance ambulation with RW, she states she likes to go outside on her patio to look at her flowers but needs assistance with mobility outside of the home. ADL's / Homemaking Assistance Needed: Pt reports needing assistance for bathing, dressing, and supervision for toileting transfers with RW; niece assists with meals and medication management.            OT Problem List: Decreased strength;Decreased coordination;Decreased range of motion;Decreased cognition;Decreased activity tolerance;Decreased safety awareness;Impaired balance (sitting and/or standing);Decreased knowledge of use of DME or AE;Pain;Impaired vision/perception;Impaired UE functional use      OT Treatment/Interventions: Self-care/ADL training;Therapeutic exercise;Therapeutic activities;DME and/or AE instruction;Patient/family education;Energy conservation    OT Goals(Current goals can be found in the care plan section) Acute Rehab OT Goals Patient Stated Goal: to go home OT Goal Formulation: With patient Time For Goal Achievement: 07/14/20 Potential to Achieve Goals: Good  OT Frequency: Min 2X/week   Barriers to D/C:  Co-evaluation              AM-PAC OT "6 Clicks" Daily Activity     Outcome Measure Help from another person eating meals?: A Little Help from another person taking care of personal grooming?: A Little Help from another person toileting, which includes using toliet, bedpan, or urinal?: A Lot Help from another person bathing (including washing, rinsing, drying)?: A Little Help from another person to put on and taking off regular upper body clothing?: A Little Help from another person to put on  and taking off regular lower body clothing?: A Lot 6 Click Score: 16   End of Session Nurse Communication: Other (comment) (Pt noted with bleeding at RUE elbow bandage.)  Activity Tolerance: Patient tolerated treatment well Patient left: in bed;with call bell/phone within reach (In gurney bed.)  OT Visit Diagnosis: Other abnormalities of gait and mobility (R26.89);Muscle weakness (generalized) (M62.81)                Time: 7225-7505 OT Time Calculation (min): 20 min Charges:  OT General Charges $OT Visit: 1 Visit OT Evaluation $OT Eval Moderate Complexity: 1 Mod OT Treatments $Self Care/Home Management : 8-22 mins  Shara Blazing, M.S., OTR/L Ascom: [phone] 06/30/20, 2:34 PM

## 2020-06-30 NOTE — ED Notes (Signed)
OT with pt

## 2020-06-30 NOTE — ED Notes (Signed)
Pt given lunch tray.

## 2020-06-30 NOTE — ED Notes (Addendum)
Bladder scan reads 250 ml's at this time.

## 2020-06-30 NOTE — Evaluation (Signed)
Physical Therapy Evaluation Patient Details Name: Marilyn Oconnell MRN: [id number] DOB: 1923/12/06 Today's Date: 06/30/2020   History of Present Illness  Pt is a 86yo F admitted to the ED on 06/30/20 for syncopal event that resulted in a fall; pt hit head, R elbow, and R hand. Pt with significant PMH including: HLD, CKD (stage IIIb), lung fibrosis, depression, HTN, CAD, COPD, elevated troponin, diastolic CHF, and A-fib. Imaging significant for right vestibular shwannoma and bil renal cystic lesions.    Clinical Impression  Pt is a 86 year old F admitted to hospital on 06/30/20 for syncopal event that resulted in a fall; imaging negative for fx. Pt questionable historian of health. At baseline, pt needs assistance with IADL's, ADL's, and supervision for short distance ambulation with RW. Pt notes her niece is able to provide PRN assistance (once a week), but chart review notes pt has caregiver available all day. Pt presents with generalized weakness, confusion, impaired vision, decreased gross balance, decreased activity tolerance, poor safety awareness, and decreased tracking with vestibular screen, resulting in impaired functional mobility. Due to deficits, pt required CGA for bed mobility, transfers, and short distance ambulation with RW. Pt vestibular screen significant for decreased tracking and corrective saccades; unable to assess VOR due to pt inability to follow commands and muscle guarding. See general comments for more details. Deficits limit the pt's ability to safely and independently perform ADL's, transfer, and ambulate. Pt will benefit from acute skilled PT services to address deficits for return to baseline function. Pt will benefit from speech consult due to slowed processing, confusion, and difficulty with word finding. If pt able to obtain 24/7 care at home, PT recommends DC home with HHPT and 24/7 supervision/assistance. Otherwise, PT recommends DC to SNF to address deficits and improve  overall safety with functional mobility prior to return home.     Follow Up Recommendations Home health PT;SNF;Supervision/Assistance - 24 hour (SNF vs. HHPT if pt able to have 24/7 care)    Equipment Recommendations  None recommended by PT    Recommendations for Other Services Speech consult     Precautions / Restrictions Precautions Precautions: Fall Restrictions Weight Bearing Restrictions: No      Mobility  Bed Mobility Overal bed mobility: Needs Assistance Bed Mobility: Supine to Sit;Sit to Supine     Supine to sit: Min guard Sit to supine: Min guard   General bed mobility comments: CGA for safety to perform bed mobility. Pt required increased time/effort to perform mobility.  Transfers Overall transfer level: Needs assistance Equipment used: Rolling walker (2 wheeled) Transfers: Sit to/from Stand;Anterior-Posterior Transfer Sit to Stand: Network engineer transfers: Min guard   General transfer comment: CGA for safety to perform STS at EOB with RW. Verbal cues for hand placement to ensure safety with transfer. Increased time/effort to perform A<>P scooting at EOB.  Ambulation/Gait Ambulation/Gait assistance: Min guard Gait Distance (Feet): 20 Feet Assistive device: Rolling walker (2 wheeled)       General Gait Details: Pt required CGA for safety to ambulate short distance with RW. Pt demonstrated shuffled gait pattern with narrow BOS and slowed cadence.  Stairs            Wheelchair Mobility    Modified Rankin (Stroke Patients Only)       Balance Overall balance assessment: Needs assistance Sitting-balance support: No upper extremity supported;Feet unsupported Sitting balance-Leahy Scale: Fair Sitting balance - Comments: Fair seated balance at EOB without BLE/BUE support; pt demonstrated slight  posterior lean, requiring CGA for safety. Postural control: Posterior lean Standing balance support: During functional  activity;Bilateral upper extremity supported Standing balance-Leahy Scale: Fair Standing balance comment: Fair standing balance with BUE support on RW.                             Pertinent Vitals/Pain Pain Assessment: No/denies pain    Home Living Family/patient expects to be discharged to:: Private residence Living Arrangements: Alone Available Help at Discharge: Family;Personal care attendant;Available 24 hours/day;Other (Comment) (Pt notes she does not have caretaker, but that her niece comes by once a week to help with IADL's. Per chart review and previous hospital admission, she has 24/7 care from caregiver.) Type of Home: House Home Access: Stairs to enter Entrance Stairs-Rails: Can reach both;Left;Right Entrance Stairs-Number of Steps: 5 steps, shortened/blind-friendly Home Layout: One level Home Equipment: Cane - single point;Shower seat - built in;Grab bars - tub/shower;Hand held shower head;Grab bars - toilet Additional Comments: per previuos documentation: Neice reports house was fully adapted including visual tape strips, bathroom, and entryway    Prior Function Level of Independence: Needs assistance (Pt questionable historian of health)   Gait / Transfers Assistance Needed: Pt reports requiring supervision for short distance ambulation with RW  ADL's / Homemaking Assistance Needed: Pt reports needing assistance for bathing, dressing, and supervision for toileting transfers with RW        Hand Dominance   Dominant Hand: Right    Extremity/Trunk Assessment   Upper Extremity Assessment Upper Extremity Assessment: Generalized weakness;RUE deficits/detail;LUE deficits/detail RUE Deficits / Details: Decreased shoulder flexion and elbow ext strength RUE Sensation: WNL LUE Deficits / Details: Decreased shoulder flexion and elbow ext strength LUE Sensation: WNL    Lower Extremity Assessment Lower Extremity Assessment: Generalized weakness;RLE  deficits/detail;LLE deficits/detail RLE Deficits / Details: unable to assess coordination due to visual deficits and pt difficulty with following commands RLE Sensation: WNL LLE Deficits / Details: unable to assess coordination due to visual deficits and pt difficulty with following commands LLE Sensation: WNL    Cervical / Trunk Assessment Cervical / Trunk Assessment: Kyphotic  Communication   Communication: HOH  Cognition Arousal/Alertness: Awake/alert Behavior During Therapy: WFL for tasks assessed/performed Overall Cognitive Status: No family/caregiver present to determine baseline cognitive functioning                                 General Comments: Pt alert to person, place, and situation. Needed cues for DOB as pt perseverated on year/day. Needed cues for month and year, as pt notes that it's "september" and "2022" despite cueing.      General Comments General comments (skin integrity, edema, etc.): Vestibular screen performed. Pt demonstrated decreased tracking and corrective saccades with smooth pursuits and saccades. No c/o lightheadedness. C/o blurred vision which she reports is baseline. Unable to perform VOR assessment due to pt inability to follow commands and guarding with neck movement.    Exercises     Assessment/Plan    PT Assessment Patient needs continued PT services  PT Problem List Decreased strength;Decreased safety awareness;Decreased mobility;Decreased activity tolerance;Decreased balance;Other (comment);Decreased coordination;Decreased cognition (impaired vision)       PT Treatment Interventions Gait training;Stair training;Functional mobility training;Therapeutic activities;Therapeutic exercise;Balance training;Neuromuscular re-education;Patient/family education    PT Goals (Current goals can be found in the Care Plan section)  Acute Rehab PT Goals Patient Stated Goal: to go home  PT Goal Formulation: With patient Time For Goal  Achievement: 07/14/20 Potential to Achieve Goals: Fair    Frequency Min 2X/week   Barriers to discharge Decreased caregiver support pt reports decreased caregiver support; chart review states pt has caregiver and 24/7 assist at home    Co-evaluation               AM-PAC PT "6 Clicks" Mobility  Outcome Measure Help needed turning from your back to your side while in a flat bed without using bedrails?: None Help needed moving from lying on your back to sitting on the side of a flat bed without using bedrails?: A Little Help needed moving to and from a bed to a chair (including a wheelchair)?: A Little Help needed standing up from a chair using your arms (e.g., wheelchair or bedside chair)?: A Little Help needed to walk in hospital room?: A Little Help needed climbing 3-5 steps with a railing? : A Little 6 Click Score: 19    End of Session Equipment Utilized During Treatment: Gait belt Activity Tolerance: Patient tolerated treatment well;Patient limited by fatigue Patient left: in bed;with call bell/phone within reach Nurse Communication: Mobility status PT Visit Diagnosis: Unsteadiness on feet (R26.81);Repeated falls (R29.6);Muscle weakness (generalized) (M62.81)    Time: 9211-9417 PT Time Calculation (min) (ACUTE ONLY): 30 min   Charges:   PT Evaluation $PT Eval Low Complexity: 1 Low PT Treatments $Therapeutic Activity: 8-22 mins      Herminio Commons, PT, DPT 10:41 AM,06/30/20

## 2020-06-30 NOTE — Consult Note (Signed)
WOC Nurse Consult Note: Reason for Consult:Trauma to right elbow and right hand from fall at home.  Blindness and chronic back pain. Is not on anticoagulation therapy.  Wound type: trauma Pressure Injury POA: NA Measurement:  nonapproximated wound edges Wound bed: red and moist Drainage (amount, consistency, odor) minimal serosanguinous oozing  No odor.  Periwound:intact Dressing procedure/placement/frequency:Cleanse skin tears to right elbow and right hand with NS.  Apply Xerofrom gauze to open wound.  Cover with dry gauze and kerlix. Change Mon/Wed/Friday.  Will not follow at this time.  Please re-consult if needed.  Domenic Moras MSN, RN, FNP-BC CWON Wound, Ostomy, Continence Nurse Pager [phone]

## 2020-06-30 NOTE — TOC Progression Note (Signed)
Transition of Care [facility]) - Progression Note    Patient Details  Name: Marilyn Oconnell MRN: [id number] Date of Birth: [birth year]  Transition of Care [facility]) CM/SW [place], [place] Phone Number: 06/30/2020, 2:35 PM  Clinical Narrative:    Pts niece Oren Section informed TOC that she has just visited pt and had converation about ALF. Pt per Ms. White is now agreeable to ALF placement. Ms. Dema Severin is visiting [facility] for possible placement and has been informed that they do have beds available. Ms. Dema Severin gave [facility] facility information: [facility] [phone] and fax [phone]. TOC to follow.    Expected Discharge Plan: [place] Barriers to Discharge: Continued Medical Work up, ED Psych evaluation (Psych eval to establish capacity.)  Expected Discharge Plan and Services Expected Discharge Plan: [place] In-house Referral: Clinical Social Work Discharge Planning Services: NA Post Acute Care Choice: Home Health Living arrangements for the past 2 months: Single Family Home                 DME Arranged: N/A DME Agency: NA                   Social Determinants of Health (SDOH) Interventions    Readmission Risk Interventions No flowsheet data found.

## 2020-06-30 NOTE — TOC Initial Note (Addendum)
Transition of Care [facility]) - Initial/Assessment Note    Patient Details  Name: Marilyn Oconnell MRN: [id number] Date of Birth: [birth year]  Transition of Care [facility]) CM/SW Contact:    Iona Beard, [place] Phone Number: 06/30/2020, 12:45 PM  Clinical Narrative:                 Pt presents to ED due to fall. TOC competed assessment with pt and received collateral information from pts niece Oren Section [phone], Ms Dema Severin is also pts POA and has the legal documentation. Pt states that she lives alone and has some come into her home during the week to help her clean, wash clothes, and fixes her meals. Pt states that she bathes, dresses, and feeds herself. Pt states she is bowel and urinary continent. Pt states that she has friends take her to her appointments and that her niece Ms. White is able to get her groceries and her medications. When asked what day it was pt knew it was Friday but stated the date was September 21st. Pt states that she does not want to go to a SNF. Pt states that she does not have [place] services.   Per conversation with pts niece Oren Section, pt cannot return home at d/c. Ms. Dema Severin states that pt has an aide that comes in a few hours each day to help with housekeeping and Ms. White goes by on weekends. Ms. Dema Severin states that pt has fallen multiple times in the last few months and is not safe to be at home. Per Ms. White pt is legally blind. Ms. Dema Severin states that pt uses a walker at home but due to fall and hand injury pt cannot use the walker currently. Ms. Dema Severin states that pt often has Syncope episodes that cause her to fall and she is unable to tell when the episode is coming on. Ms. Dema Severin states that she cannot bring pt into her home because she works 12 hour shifts and pt would be alone too long. When speaking with Ms. White TOC was informed it would be helpful if hospital staff could explain to pt why it is unsafe for her to return home. Ms. Dema Severin is hoping that pt will be  agreeable to go to [facility]/ [place] upon d/c. Ms. Dema Severin will be going to view [facility] and gave [facility] fax information [phone]. Ms. White is very concerned about the safety of pt returning home at d/c. [facility] informed Ms. White that if pt is alert and orientated pt can make decisions for herself. TOC to follow.   Expected Discharge Plan: [place] Barriers to Discharge: Continued Medical Work up, ED Psych evaluation (Psych eval to establish capacity.)   Patient Goals and CMS Choice Patient states their goals for this hospitalization and ongoing recovery are:: Return home with [facility]   Choice offered to / list presented to : NA  Expected Discharge Plan and Services Expected Discharge Plan: [place] In-house Referral: Clinical Social Work Discharge Planning Services: NA Post Acute Care Choice: Home Health Living arrangements for the past 2 months: Single Family Home                 DME Arranged: N/A DME Agency: NA                  Prior Living Arrangements/Services Living arrangements for the past 2 months: [place] with:: Self Patient language and need for interpreter reviewed::  Yes Do you feel safe going back to the place where you live?: Yes        Care giver support system in place?: Yes (comment) Oren Section (POA) (Niece) [phone]) Current home services: Housekeeping Criminal Activity/Legal Involvement Pertinent to Current Situation/Hospitalization: No - Comment as needed  Activities of Daily Living      Permission Sought/Granted Permission sought to share information with : Family Supports Permission granted to share information with : Yes, Verbal Permission Granted  Share Information with NAME: [place] granted to share info w Relationship: Niece (POA)  Permission granted to share info w Contact Information: [phone]  Emotional Assessment Appearance:: Appears younger than  stated age Attitude/Demeanor/Rapport: Engaged Affect (typically observed): Accepting Orientation: : Oriented to Place, Oriented to Self, Oriented to Situation Alcohol / Substance Use: Not Applicable Psych Involvement: Yes (comment) (Eval to establish pts capacity.)  Admission diagnosis:  Fall [W19.XXXA] Patient Active Problem List   Diagnosis Date Noted  . Fall 06/30/2020  . Hypertension   . Coronary artery disease   . COPD (chronic obstructive pulmonary disease) ([place])   . Hypertensive urgency   . Elevated troponin   . PAF (paroxysmal atrial fibrillation) ([place])   . Chronic diastolic CHF (congestive heart failure) ([place])   . Current mild episode of major depressive disorder, unspecified whether recurrent ([place]) 04/25/2020  . Advanced age 45/09[birth year]2019  . 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 Blindness 03/23/2020  . Atrial fibrillation with RVR ([place]) 03/22/2020  . Chronic pain syndrome 03/16/2020  . Closed nondisplaced fracture of surgical neck of left humerus 03/16/2020  . Chronic left-sided headaches 03/16/2020  . Occipital neuralgia of left side 03/16/2020  . Syncope 07/20/2019  . Recurrent syncope 07/19/2019  . Insomnia 03/18/2016  . Visual hallucinations 03/18/2016  . Vertigo 03/15/2016  . Paroxysmal atrial fibrillation with RVR ([place]) 11/27/2015  . Depression 11/27/2015  . Adaptation reaction 09/28/2015  . Aortic heart valve narrowing 09/28/2015  . Bilateral cataracts 09/28/2015  . Cardiac murmur 09/28/2015  . Acute renal failure superimposed on stage 3b chronic kidney disease ([place]) 09/28/2015  . History of gout 09/28/2015  . 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/2015  . OP (osteoporosis) 09/28/2015  . Atherosclerosis of coronary artery 09/08/2015  . Avitaminosis D 07/11/2015  . LBP (low back pain) 07/11/2015  . BP (high blood pressure) 07/11/2015  . HLD (hyperlipidemia) 04/12/2015  . Billowing mitral valve 08/03/2014  . Fibrosis of lung ([place]) 05/25/2014  . Asthma-chronic  obstructive pulmonary disease overlap syndrome ([place]) 05/25/2014  . H/O aortic valve replacement 11/09/2004   PCP:  Trinna Post, PA-C Pharmacy:   [place], [place] - [address] [place] [address] Phone: [phone] Fax: [phone]  [facility], [place], [address] [place], [place] [address] Phone: [phone] Fax: [phone]     Social Determinants of Health (SDOH) Interventions    Readmission Risk Interventions No flowsheet data found.

## 2020-06-30 NOTE — TOC Progression Note (Signed)
Transition of Care [facility]) - Progression Note    Patient Details  Name: AMARRA SAWYER MRN: [id number] Date of Birth: 1924/03/10  Transition of Care [facility]) CM/SW [place], [place] Phone Number: 06/30/2020, 12:59 PM  Clinical Narrative:    Niece is pts POA and has the needed legal documentation. In order for POA to become active pt must be seen by psych to establish capacity. TOC reached out to Attending, Dr. [place] Hamper to see if a psych eval could be placed to establish pts capacity. TOC will follow up upon psych evaluation. TOC to follow.    Expected Discharge Plan: [place] Barriers to Discharge: Continued Medical Work up, ED Psych evaluation (Psych eval to establish capacity.)  Expected Discharge Plan and Services Expected Discharge Plan: [place] In-house Referral: Clinical Social Work Discharge Planning Services: NA Post Acute Care Choice: Home Health Living arrangements for the past 2 months: Single Family Home                 DME Arranged: N/A DME Agency: NA                   Social Determinants of Health (SDOH) Interventions    Readmission Risk Interventions No flowsheet data found.

## 2020-06-30 NOTE — ED Notes (Signed)
Attempted to call report- RN busy. Extension provided for call-back.

## 2020-06-30 NOTE — ED Provider Notes (Signed)
[facility] Emergency Department Provider Note  ____________________________________________   First MD Initiated Contact with Patient 06/30/20 0127     (approximate)  I have reviewed the triage vital signs and the nursing notes.   HISTORY  Chief Complaint Fall    HPI Marilyn Oconnell is a 86 y.o. female with below list of previous medical conditions including aortic stenosis congestive heart failure COPD and recurrent syncope presents to the emergency department [facility] to syncopal episode which occurred at home today.  Patient admits to preceding dizziness before the event however no other preceding symptoms.  Patient currently admits to right elbow hand pain and headache with swelling to the scalp.  Patient denies any chest pain no shortness of breath.  Of note the patient lives at home alone.  Patient's niece at bedside states that she does have a caregiver that comes in for a few hours each day.        Past Medical History:  Diagnosis Date  . Arthritis   . CHF (congestive heart failure) ([place])   . COPD (chronic obstructive pulmonary disease) ([place])   . Coronary artery disease   . Hypercholesteremia   . Hypertension     Patient Active Problem List   Diagnosis Date Noted  . Current mild episode of major depressive disorder, unspecified whether recurrent ([place]) 04/25/2020  . Advanced age 12/23/2019  . 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 Blindness 03/23/2020  . Atrial fibrillation with RVR ([place]) 03/22/2020  . Chronic pain syndrome 03/16/2020  . Closed nondisplaced fracture of surgical neck of left humerus 03/16/2020  . Chronic left-sided headaches 03/16/2020  . Occipital neuralgia of left side 03/16/2020  . Syncope 07/20/2019  . Recurrent syncope 07/19/2019  . Insomnia 03/18/2016  . Visual hallucinations 03/18/2016  . Vertigo 03/15/2016  . Paroxysmal atrial fibrillation with RVR ([place]) 11/27/2015  . Depression 11/27/2015  .  Adaptation reaction 09/28/2015  . Aortic heart valve narrowing 09/28/2015  . Bilateral cataracts 09/28/2015  . Cardiac murmur 09/28/2015  . Chronic kidney disease (CKD), stage III (moderate) ([place]) 09/28/2015  . History of gout 09/28/2015  . 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/2015  . OP (osteoporosis) 09/28/2015  . Atherosclerosis of coronary artery 09/08/2015  . Avitaminosis D 07/11/2015  . LBP (low back pain) 07/11/2015  . BP (high blood pressure) 07/11/2015  . Hyperlipemia 04/12/2015  . Billowing mitral valve 08/03/2014  . Fibrosis of lung ([place]) 05/25/2014  . Asthma-chronic obstructive pulmonary disease overlap syndrome ([place]) 05/25/2014  . H/O aortic valve replacement 11/09/2004    Past Surgical History:  Procedure Laterality Date  . AORTIC VALVE REPLACEMENT  2006  . DILATION AND CURETTAGE OF UTERUS  1988  . SHOULDER SURGERY  09/1999  . SIGMOIDOSCOPY  1992    Prior to Admission medications   Medication Sig Start Date End Date Taking? Authorizing Provider  acetaminophen (TYLENOL) 500 MG tablet Take 500-1,000 mg by mouth 2 (two) times daily as needed for mild pain, moderate pain, fever or headache.     [provider]  albuterol (PROVENTIL) (2.5 MG/3ML) 0.083% nebulizer solution Take 2.5 mg by nebulization every 8 (eight) hours as needed for wheezing or shortness of breath.     [provider]  allopurinol (ZYLOPRIM) 100 MG tablet Take 100 mg by mouth every evening.     [provider]  budesonide (PULMICORT) 0.5 MG/2ML nebulizer solution Take 0.5 mg by nebulization at bedtime.    [provider]  diclofenac Sodium (VOLTAREN) 1 % GEL Apply  topically. 03/29/20   [provider]  escitalopram (LEXAPRO) 20 MG tablet TAKE 1 TABLET BY MOUTH  DAILY Patient taking differently: Take 20 mg by mouth daily.  05/13/19   Trinna Post, PA-C  Fluticasone-Salmeterol (ADVAIR DISKUS) 250-50 MCG/DOSE AEPB USE ONE (1) INHALATION BY MOUTH EVERY 12 HOURS. RINSE  MOUTH OUT AFTERUSE. 03/27/20   Swayze, Ava, DO  furosemide (LASIX) 20 MG tablet TAKE 1 TABLET BY MOUTH  DAILY Patient taking differently: Take 20 mg by mouth daily.  05/13/19   Trinna Post, PA-C  hydrALAZINE (APRESOLINE) 10 MG tablet Take 1 tablet (10 mg total) by mouth 2 (two) times daily. 05/25/20 08/23/20  Trinna Post, PA-C  ipratropium-albuterol (DUONEB) 0.5-2.5 (3) MG/3ML SOLN Take 3 mLs by nebulization 3 (three) times daily. 03/27/20   Swayze, Ava, DO  losartan (COZAAR) 25 MG tablet Take 0.5 tablets (12.5 mg total) by mouth daily. 05/25/20 08/23/20  Trinna Post, PA-C  metoprolol succinate (TOPROL-XL) 50 MG 24 hr tablet Take 1.5 tablets (75 mg total) by mouth daily. Take with or immediately following a meal. 03/28/20   Swayze, Ava, DO  simvastatin (ZOCOR) 20 MG tablet TAKE 1 TABLET BY MOUTH AT  BEDTIME 05/13/19   Carles Collet M, PA-C  traZODone (DESYREL) 50 MG tablet TAKE ONE-HALF TABLET BY  MOUTH AT BEDTIME 07/01/19   Trinna Post, PA-C    Allergies Iodine, Shellfish allergy, Azithromycin, and Sulfa antibiotics  Family History  Problem Relation Age of Onset  . Aneurysm Father   . Lung cancer Brother   . Prostate cancer Brother   . Heart failure Brother     Social History Social History   Tobacco Use  . Smoking status: Never Smoker  . Smokeless tobacco: Never Used  Substance Use Topics  . Alcohol use: No  . Drug use: No    Review of Systems Constitutional: No fever/chills Eyes: No visual changes. ENT: No sore throat. Cardiovascular: Denies chest pain. Respiratory: Denies shortness of breath. Gastrointestinal: No abdominal pain.  No nausea, no vomiting.  No diarrhea.  No constipation. Genitourinary: Negative for dysuria. Musculoskeletal: Negative for neck pain.  Negative for back pain. Integumentary: Negative for rash. Neurological: Positive for syncope and subsequent headache, negative for focal weakness or  numbness.   ____________________________________________   PHYSICAL EXAM:  VITAL SIGNS: ED Triage Vitals  Enc Vitals Group     BP 06/29/20 1954 (!) 157/100     Pulse Rate 06/29/20 1954 60     Resp 06/29/20 1954 18     Temp 06/29/20 1954 97.8 F (36.6 C)     Temp Source 06/29/20 1954 Oral     SpO2 06/29/20 1946 97 %     Weight 06/29/20 1955 57.6 kg (127 lb)     Height 06/29/20 1955 1.549 m (5\' 1" )     Head Circumference --      Peak Flow --      Pain Score 06/29/20 2021 6     Pain Loc --      Pain Edu? --      Excl. in [place]? --     Constitutional: Alert and oriented.  Eyes: Conjunctivae are normal.  Head: Right parietoccipital scalp swelling Mouth/Throat: Patient is wearing a mask. Neck: No stridor.  No meningeal signs.   Cardiovascular: Normal rate, regular rhythm. Good peripheral circulation. Grossly normal heart sounds. Respiratory: Normal respiratory effort.  No retractions. Gastrointestinal: Soft and nontender. No distention.  Musculoskeletal: No lower extremity tenderness nor edema. No  gross deformities of extremities. Neurologic:  Normal speech and language. No gross focal neurologic deficits are appreciated.  Skin:  Skin is warm, dry and intact. Psychiatric: Mood and affect are normal. Speech and behavior are normal.  ____________________________________________   LABS (all labs ordered are listed, but only abnormal results are displayed)  Labs Reviewed  CBC - Abnormal; Notable for the following components:      Result Value   RBC 3.72 (*)    Hemoglobin 11.5 (*)    HCT 34.8 (*)    All other components within normal limits  BASIC METABOLIC PANEL - Abnormal; Notable for the following components:   Glucose, Bld 165 (*)    BUN 49 (*)    Creatinine, Ser 1.82 (*)    GFR calc non Af Amer 23 (*)    All other components within normal limits  TROPONIN I (HIGH SENSITIVITY) - Abnormal; Notable for the following components:   Troponin I (High Sensitivity) 20 (*)     All other components within normal limits   ____________________________________________  EKG  ED ECG REPORT I, [place] N Kikuye Korenek, the attending physician, personally viewed and interpreted this ECG.   Date: 06/30/2020  EKG Time: 7:56 PM  Rate: 60  Rhythm: Normal sinus rhythm  Axis: Normal  Intervals: Normal  ST&T Change: None   ____________________________________________  RADIOLOGY I, [place] N Graceann Boileau, personally viewed and evaluated these images (plain radiographs) as part of my medical decision making, as well as reviewing the written report by the radiologist.  ED MD interpretation:    Official radiology report(s): DG Shoulder Right  Result Date: 06/29/2020 CLINICAL DATA:  Fall, right shoulder pain EXAM: RIGHT SHOULDER - 2+ VIEW COMPARISON:  None. FINDINGS: Right shoulder arthroplasty has been performed. Normal alignment. No acute fracture or dislocation. Limited evaluation of the right hemithorax is unremarkable. IMPRESSION: No acute fracture or dislocation. Electronically Signed   By: Fidela Salisbury MD   On: 06/29/2020 21:30   DG Elbow Complete Right  Result Date: 06/29/2020 CLINICAL DATA:  Fall, right elbow pain EXAM: RIGHT ELBOW - COMPLETE 3+ VIEW COMPARISON:  None. FINDINGS: Five view radiograph of the right elbow demonstrates normal alignment. No acute fracture or dislocation. Joint spaces are preserved. No effusion. Healed right humeral diaphyseal fracture with residual deformity is incompletely evaluated on this examination. Soft tissues are unremarkable. IMPRESSION: No acute fracture or dislocation. Electronically Signed   By: Fidela Salisbury MD   On: 06/29/2020 21:27   CT Head Wo Contrast  Result Date: 06/29/2020 CLINICAL DATA:  Fall, head injury EXAM: CT HEAD WITHOUT CONTRAST CT CERVICAL SPINE WITHOUT CONTRAST TECHNIQUE: Multidetector CT imaging of the head and cervical spine was performed following the standard protocol without intravenous contrast. Multiplanar CT  image reconstructions of the cervical spine were also generated. COMPARISON:  None. FINDINGS: CT HEAD FINDINGS Brain: Normal anatomic configuration. Parenchymal volume loss is commensurate with the patient's age. Moderate subcortical and periventricular white matter changes are present likely reflecting the sequela of small vessel ischemia. No abnormal intra or extra-axial mass lesion or fluid collection. No abnormal mass effect or midline shift. No evidence of acute intracranial hemorrhage or infarct. Ventricular size is normal. Cerebellum unremarkable. Vascular: No asymmetric hyperdense vasculature at the skull base. Extensive atherosclerotic calcification noted. Skull: Intact Sinuses/Orbits: Paranasal sinuses are clear. Orbits are unremarkable. Other: Mastoid air cells and middle ear cavities are clear. CT CERVICAL SPINE FINDINGS Alignment: Grade 1 anterolisthesis of C6 upon C7 is likely degenerative in nature. Otherwise normal alignment.  Skull base and vertebrae: Craniocervical junction is unremarkable. Atlantodental interval is normal. No acute fracture of the cervical spine. No lytic or blastic bone lesion. Soft tissues and spinal canal: No prevertebral fluid or swelling. No visible canal hematoma. Disc levels: There is intervertebral disc space narrowing and endplate remodeling at Q7-6 and C7-T1 in keeping with changes of advanced degenerative disc disease. Vertebral body height has been preserved. Review of the axial images demonstrates extensive multilevel uncovertebral and facet arthrosis with resultant multilevel neural foraminal narrowing. The spinal canal is widely patent. Upper chest: The visualized lung apices are clear. A a thoracic aortic arch aneurysm is incompletely visualized on this examination measuring at least 3.4 cm in greatest dimension. Other: None significant IMPRESSION: No acute intracranial injury.  No calvarial fracture. No acute fracture of the cervical spine. Incompletely evaluated  thoracic aortic aneurysm. If indicated, this would be better assessed with dedicated CT arteriography of the chest. Aortic aneurysm NOS (ICD10-I71.9). Electronically Signed   By: Fidela Salisbury MD   On: 06/29/2020 21:26   CT Cervical Spine Wo Contrast  Result Date: 06/29/2020 CLINICAL DATA:  Fall, head injury EXAM: CT HEAD WITHOUT CONTRAST CT CERVICAL SPINE WITHOUT CONTRAST TECHNIQUE: Multidetector CT imaging of the head and cervical spine was performed following the standard protocol without intravenous contrast. Multiplanar CT image reconstructions of the cervical spine were also generated. COMPARISON:  None. FINDINGS: CT HEAD FINDINGS Brain: Normal anatomic configuration. Parenchymal volume loss is commensurate with the patient's age. Moderate subcortical and periventricular white matter changes are present likely reflecting the sequela of small vessel ischemia. No abnormal intra or extra-axial mass lesion or fluid collection. No abnormal mass effect or midline shift. No evidence of acute intracranial hemorrhage or infarct. Ventricular size is normal. Cerebellum unremarkable. Vascular: No asymmetric hyperdense vasculature at the skull base. Extensive atherosclerotic calcification noted. Skull: Intact Sinuses/Orbits: Paranasal sinuses are clear. Orbits are unremarkable. Other: Mastoid air cells and middle ear cavities are clear. CT CERVICAL SPINE FINDINGS Alignment: Grade 1 anterolisthesis of C6 upon C7 is likely degenerative in nature. Otherwise normal alignment. Skull base and vertebrae: Craniocervical junction is unremarkable. Atlantodental interval is normal. No acute fracture of the cervical spine. No lytic or blastic bone lesion. Soft tissues and spinal canal: No prevertebral fluid or swelling. No visible canal hematoma. Disc levels: There is intervertebral disc space narrowing and endplate remodeling at P9-5 and C7-T1 in keeping with changes of advanced degenerative disc disease. Vertebral body height  has been preserved. Review of the axial images demonstrates extensive multilevel uncovertebral and facet arthrosis with resultant multilevel neural foraminal narrowing. The spinal canal is widely patent. Upper chest: The visualized lung apices are clear. A a thoracic aortic arch aneurysm is incompletely visualized on this examination measuring at least 3.4 cm in greatest dimension. Other: None significant IMPRESSION: No acute intracranial injury.  No calvarial fracture. No acute fracture of the cervical spine. Incompletely evaluated thoracic aortic aneurysm. If indicated, this would be better assessed with dedicated CT arteriography of the chest. Aortic aneurysm NOS (ICD10-I71.9). Electronically Signed   By: Fidela Salisbury MD   On: 06/29/2020 21:26   DG Knee Complete 4 Views Right  Result Date: 06/29/2020 CLINICAL DATA:  Fall, right knee pain EXAM: RIGHT KNEE - COMPLETE 4+ VIEW COMPARISON:  None. FINDINGS: Four view radiograph right knee demonstrates normal alignment. No fracture or dislocation. Joint spaces are preserved. No effusion. Advanced vascular calcifications are seen within the posterior soft tissues. IMPRESSION: No acute fracture or dislocation. Electronically Signed  By: Fidela Salisbury MD   On: 06/29/2020 21:31   DG Hand Complete Right  Result Date: 06/29/2020 CLINICAL DATA:  Initial evaluation for acute trauma, fall. EXAM: RIGHT HAND - COMPLETE 3+ VIEW COMPARISON:  None. FINDINGS: Bones are severely osteopenic, somewhat limiting assessment for possible subtle acute nondisplaced fractures. No acute fracture or dislocation. Prominent degenerative osteoarthritic changes noted at the first [facility] articulation. Few scattered juxta-articular erosions with overhanging edges noted at the right fourth and fifth MCP articulations, which could reflect changes related underlying gouty arthropathy. No visible soft tissue injury. Atherosclerotic change noted at the wrist. IMPRESSION: 1. No acute osseous  abnormality about the right hand. 2. Severe osteopenia. 3. Few scattered juxta-articular erosions with overhanging edges at the right fourth and fifth MCP articulations, which could reflect changes related to underlying gouty arthropathy. Electronically Signed   By: Jeannine Boga M.D.   On: 06/29/2020 21:18     Procedures   ____________________________________________   INITIAL IMPRESSION / MDM / ASSESSMENT AND PLAN / ED COURSE  As part of my medical decision making, I reviewed the following data within the [place] NUMBER   86 year old female presented with above-stated history and physical exam following syncopal episode.  EKG revealed no evidence of ischemia or infarction or arrhythmia.  CT head revealed no acute intracranial abnormality.  Laboratory data revealed creatinine of 1.82 with a BUN of 49 which is consistent with previous.  CT cervical spine revealed evidence of a possible aortic aneurysm and as such MR angiogram of the chest abdomen pelvis was ordered.    ____________________________________________  FINAL CLINICAL IMPRESSION(S) / ED DIAGNOSES  Final diagnoses:  Syncope, unspecified syncope type     MEDICATIONS GIVEN DURING THIS VISIT:  Medications  hydrALAZINE (APRESOLINE) tablet 10 mg (10 mg Oral Given 06/30/20 0222)  losartan (COZAAR) tablet 25 mg (25 mg Oral Given 06/30/20 0222)  acetaminophen (TYLENOL) tablet 650 mg (650 mg Oral Given 06/30/20 0222)     ED Discharge Orders    None      *Please note:  LILLIONNA NABI was evaluated in Emergency Department on 06/30/2020 for the symptoms described in the history of present illness. She was evaluated in the context of the global COVID-19 pandemic, which necessitated consideration that the patient might be at risk for infection with the SARS-CoV-2 virus that causes COVID-19. Institutional protocols and algorithms that pertain to the evaluation of patients at risk for COVID-19 are in a state of rapid  change based on information released by regulatory bodies including the CDC and federal and state organizations. These policies and algorithms were followed during the patient's care in the ED.  Some ED evaluations and interventions may be delayed as a result of limited staffing during and after the pandemic.*  Note:  This document was prepared using Dragon voice recognition software and may include unintentional dictation errors.   Gregor Hams, MD 07/05/20 [phone]

## 2020-06-30 NOTE — H&P (Addendum)
History and Physical    Marilyn Oconnell:[id number] DOB: 03-14-1924 DOA: 06/30/2020  Referring MD/NP/PA:   PCP: Trinna Post, PA-C   Patient coming from:  The patient is coming from home.  At baseline, pt is partially dependent for most of ADL.        Chief Complaint: fall  HPI: Marilyn Oconnell is a 86 y.o. female with medical history significant of legally blindness, hypertension, hyperlipidemia, COPD, asthma, gout, depression, CAD, dCHF, chronic back pain, aortic stenosis (s/p of aortic valve replacement), CKD stage III, pulmonary fibrosis, atrial fibrillation not on anticoagulants, possible vestibular schwannoma, who presents with fall.  Pt has blindness, and lives alone at home with caregiver's help for several hours each day.  Pt states that she fell when she was walking to phone in kitchen and tripped her steps. She denies LOC. Per her niece, pt did not have LOC. She states that she had dizziness when she fell. She has skin tear right elbow and right hand. She has a hematoma to scalp. She has pain in right elbow, right hand, right knee and right shoulder. Per her niece, pt has had multiple falls in the past several months.  No unilateral numbness or tingling his extremities for no facial droop or slurred speech.  Moves all extremities. No chest pain, cough, shortness breath.  No fever or chills.  No nausea vomiting, diarrhea, abdominal pain, symptoms of UTI.  ED Course: pt was found to have WBC 7.4, pending COVID-19 PCR, troponin level 20, worsening renal function, temperature normal, blood pressure 231/86 --> 133/85, heart rate 55, 66, RR 23, oxygen saturation 96%.  CT of head is negative for acute intracranial abnormalities.  CT of C-spine is negative for acute bony fracture.  CT of head and C spin showed an incompletely evaluated thoracic aortic aneurysm. Pending MRA of chest/abd/pelvis. Pt is admitted to med-surg as inpt by accepting MD.    Review of Systems:   General: no  fevers, chills, no body weight gain, has fatigue HEENT: no hearing changes or sore throat. Has blindness Respiratory: no dyspnea, coughing, wheezing CV: no chest pain, no palpitations GI: no nausea, vomiting, abdominal pain, diarrhea, constipation GU: no dysuria, burning on urination, increased urinary frequency, hematuria  Ext: no leg edema Neuro: no unilateral weakness, numbness, or tingling, no vision change or hearing loss. Has fall. Skin: no rash, has skin tear. MSK: No muscle spasm, no deformity, no limitation of range of movement in spin Heme: No easy bruising.  Travel history: No recent long distant travel.  Allergy:  Allergies  Allergen Reactions  . Iodine Anaphylaxis  . Shellfish Allergy Anaphylaxis  . Azithromycin Hives  . Sulfa Antibiotics Hives    Past Medical History:  Diagnosis Date  . Arthritis   . CHF (congestive heart failure) ([place])   . COPD (chronic obstructive pulmonary disease) ([place])   . Coronary artery disease   . Hypercholesteremia   . Hypertension     Past Surgical History:  Procedure Laterality Date  . AORTIC VALVE REPLACEMENT  2006  . DILATION AND CURETTAGE OF UTERUS  1988  . SHOULDER SURGERY  09/1999  . SIGMOIDOSCOPY  1992    Social History:  reports that she has never smoked. She has never used smokeless tobacco. She reports that she does not drink alcohol and does not use drugs.  Family History:  Family History  Problem Relation Age of Onset  . Aneurysm Father   . Lung cancer Brother   . Prostate  cancer Brother   . Heart failure Brother      Prior to Admission medications   Medication Sig Start Date End Date Taking? Authorizing Provider  acetaminophen (TYLENOL) 500 MG tablet Take 500-1,000 mg by mouth 2 (two) times daily as needed for mild pain, moderate pain, fever or headache.    Yes [provider]  allopurinol (ZYLOPRIM) 100 MG tablet Take 100 mg by mouth daily.    Yes [provider]  budesonide (PULMICORT) 0.5  MG/2ML nebulizer solution Take 0.5 mg by nebulization at bedtime.   Yes [provider]  diclofenac Sodium (VOLTAREN) 1 % GEL Apply topically as needed.  03/29/20  Yes [provider]  escitalopram (LEXAPRO) 20 MG tablet TAKE 1 TABLET BY MOUTH  DAILY Patient taking differently: Take 20 mg by mouth daily.  05/13/19  Yes Carles Collet M, PA-C  furosemide (LASIX) 20 MG tablet TAKE 1 TABLET BY MOUTH  DAILY Patient taking differently: Take 20 mg by mouth daily.  05/13/19  Yes Carles Collet M, PA-C  hydrALAZINE (APRESOLINE) 10 MG tablet Take 1 tablet (10 mg total) by mouth 2 (two) times daily. 05/25/20 08/23/20 Yes Pollak, Wendee Beavers, PA-C  ipratropium-albuterol (DUONEB) 0.5-2.5 (3) MG/3ML SOLN Take 3 mLs by nebulization 3 (three) times daily. Patient taking differently: Take 3 mLs by nebulization in the morning and at bedtime.  03/27/20  Yes Swayze, Ava, DO  losartan (COZAAR) 25 MG tablet Take 0.5 tablets (12.5 mg total) by mouth daily. 05/25/20 08/23/20 Yes Trinna Post, PA-C  metoprolol succinate (TOPROL-XL) 50 MG 24 hr tablet Take 1.5 tablets (75 mg total) by mouth daily. Take with or immediately following a meal. 03/28/20  Yes Swayze, Ava, DO  albuterol (PROVENTIL) (2.5 MG/3ML) 0.083% nebulizer solution Take 2.5 mg by nebulization every 8 (eight) hours as needed for wheezing or shortness of breath.  Patient not taking: Reported on 06/30/2020    [provider]  Fluticasone-Salmeterol (ADVAIR DISKUS) 250-50 MCG/DOSE AEPB USE ONE (1) INHALATION BY MOUTH EVERY 12 HOURS. RINSE MOUTH OUT AFTERUSE. Patient not taking: Reported on 06/30/2020 03/27/20   Swayze, Ava, DO  simvastatin (ZOCOR) 20 MG tablet TAKE 1 TABLET BY MOUTH AT  BEDTIME Patient not taking: Reported on 06/30/2020 05/13/19   Trinna Post, PA-C  traZODone (DESYREL) 50 MG tablet TAKE ONE-HALF TABLET BY  MOUTH AT BEDTIME Patient not taking: Reported on 06/30/2020 07/01/19   Trinna Post, PA-C    Physical Exam: Vitals:     06/30/20 0138 06/30/20 0145 06/30/20 0200 06/30/20 0300  BP: (!) 218/66  (!) 193/106 (!) 163/85  Pulse: 66 (!) 58 (!) 59 (!) 55  Resp: 17 (!) 23 17 19   Temp:      TempSrc:      SpO2: 99% 100% 96% 96%  Weight:      Height:       General: Not in acute distress HEENT:       Eyes: no scleral icterus. Legally blindness       ENT: No discharge from the ears and nose, no pharynx injection, no tonsillar enlargement.        Neck: No JVD, no bruit, no mass felt. Heme: No neck lymph node enlargement. Cardiac: O2/V0, RRR, 3/3 systolic murmurs, No gallops or rubs. Respiratory: No rales, wheezing, rhonchi or rubs. GI: Soft, nondistended, nontender, no rebound pain, no organomegaly, BS present. GU: No hematuria Ext: has trace leg edema bilaterally. 1+DP/PT pulse bilaterally. Musculoskeletal: No joint deformities, No joint redness or warmth,  no limitation of ROM in spin. Skin: No rashes. Has skin tear in right elbow and right hand. Neuro: Pt is alert, knows her own name, and knows that she is in hospital. She states year 2022. cranial nerves II-XII grossly intact, moves all extremities.  Muscle strength 5/5 in all extremities. Psych: Patient is not psychotic, no suicidal or hemocidal ideation.  Labs on Admission: I have personally reviewed following labs and imaging studies  CBC: Recent Labs  Lab 06/29/20 1956  WBC 7.4  HGB 11.5*  HCT 34.8*  MCV 93.5  PLT 174   Basic Metabolic Panel: Recent Labs  Lab 06/29/20 1956  NA 137  K 4.0  CL 98  CO2 26  GLUCOSE 165*  BUN 49*  CREATININE 1.82*  CALCIUM 9.2   GFR: Estimated Creatinine Clearance: 15.4 mL/min (A) (by C-G formula based on SCr of 1.82 mg/dL (H)). Liver Function Tests: No results for input(s): AST, ALT, ALKPHOS, BILITOT, PROT, ALBUMIN in the last 168 hours. No results for input(s): LIPASE, AMYLASE in the last 168 hours. No results for input(s): AMMONIA in the last 168 hours. Coagulation Profile: No results for input(s):  INR, PROTIME in the last 168 hours. Cardiac Enzymes: No results for input(s): CKTOTAL, CKMB, CKMBINDEX, TROPONINI in the last 168 hours. BNP (last 3 results) No results for input(s): PROBNP in the last 8760 hours. HbA1C: No results for input(s): HGBA1C in the last 72 hours. CBG: No results for input(s): GLUCAP in the last 168 hours. Lipid Profile: No results for input(s): CHOL, HDL, LDLCALC, TRIG, CHOLHDL, LDLDIRECT in the last 72 hours. Thyroid Function Tests: No results for input(s): TSH, T4TOTAL, FREET4, T3FREE, THYROIDAB in the last 72 hours. Anemia Panel: No results for input(s): VITAMINB12, FOLATE, FERRITIN, TIBC, IRON, RETICCTPCT in the last 72 hours. Urine analysis:    Component Value Date/Time   COLORURINE YELLOW (A) 03/23/2020 1540   APPEARANCEUR HAZY (A) 03/23/2020 1540   LABSPEC 1.013 03/23/2020 1540   PHURINE 5.0 03/23/2020 1540   GLUCOSEU NEGATIVE 03/23/2020 1540   HGBUR NEGATIVE 03/23/2020 1540   BILIRUBINUR NEGATIVE 03/23/2020 1540   KETONESUR NEGATIVE 03/23/2020 1540   PROTEINUR 30 (A) 03/23/2020 1540   NITRITE NEGATIVE 03/23/2020 1540   LEUKOCYTESUR TRACE (A) 03/23/2020 1540   Sepsis Labs: @LABRCNTIP (procalcitonin:4,lacticidven:4) ) Recent Results (from the past 240 hour(s))  Respiratory Panel by RT PCR (Flu A&B, Covid) - Nasopharyngeal Swab     Status: None   Collection Time: 06/30/20  7:32 AM   Specimen: Nasopharyngeal Swab  Result Value Ref Range Status   SARS Coronavirus 2 by RT PCR NEGATIVE NEGATIVE Final    Comment: (NOTE) SARS-CoV-2 target nucleic acids are NOT DETECTED.  The SARS-CoV-2 RNA is generally detectable in upper respiratoy specimens during the acute phase of infection. The lowest concentration of SARS-CoV-2 viral copies this assay can detect is 131 copies/mL. A negative result does not preclude SARS-Cov-2 infection and should not be used as the sole basis for treatment or other patient management decisions. A negative result may occur  with  improper specimen collection/handling, submission of specimen other than nasopharyngeal swab, presence of viral mutation(s) within the areas targeted by this assay, and inadequate number of viral copies (<131 copies/mL). A negative result must be combined with clinical observations, patient history, and epidemiological information. The expected result is Negative.  Fact Sheet for Patients:  PinkCheek.be  Fact Sheet for Healthcare Providers:  GravelBags.it  This test is no t yet approved or cleared by the [place] and  has been authorized for detection and/or diagnosis of SARS-CoV-2 by FDA under an Emergency Use Authorization (EUA). This EUA will remain  in effect (meaning this test can be used) for the duration of the COVID-19 declaration under Section 564(b)(1) of the Act, 21 U.S.C. section 360bbb-3(b)(1), unless the authorization is terminated or revoked sooner.     Influenza A by PCR NEGATIVE NEGATIVE Final   Influenza B by PCR NEGATIVE NEGATIVE Final    Comment: (NOTE) The Xpert Xpress SARS-CoV-2/FLU/RSV assay is intended as an aid in  the diagnosis of influenza from Nasopharyngeal swab specimens and  should not be used as a sole basis for treatment. Nasal washings and  aspirates are unacceptable for Xpert Xpress SARS-CoV-2/FLU/RSV  testing.  Fact Sheet for Patients: PinkCheek.be  Fact Sheet for Healthcare Providers: GravelBags.it  This test is not yet approved or cleared by the [place] FDA and  has been authorized for detection and/or diagnosis of SARS-CoV-2 by  FDA under an Emergency Use Authorization (EUA). This EUA will remain  in effect (meaning this test can be used) for the duration of the  Covid-19 declaration under Section 564(b)(1) of the Act, 21  U.S.C. section 360bbb-3(b)(1), unless the authorization is  terminated or  revoked. Performed at [facility], [address] [place], [place] [address]      Radiological Exams on Admission: DG Shoulder Right  Result Date: 06/29/2020 CLINICAL DATA:  Fall, right shoulder pain EXAM: RIGHT SHOULDER - 2+ VIEW COMPARISON:  None. FINDINGS: Right shoulder arthroplasty has been performed. Normal alignment. No acute fracture or dislocation. Limited evaluation of the right hemithorax is unremarkable. IMPRESSION: No acute fracture or dislocation. Electronically Signed   By: Fidela Salisbury MD   On: 06/29/2020 21:30   DG Elbow Complete Right  Result Date: 06/29/2020 CLINICAL DATA:  Fall, right elbow pain EXAM: RIGHT ELBOW - COMPLETE 3+ VIEW COMPARISON:  None. FINDINGS: Five view radiograph of the right elbow demonstrates normal alignment. No acute fracture or dislocation. Joint spaces are preserved. No effusion. Healed right humeral diaphyseal fracture with residual deformity is incompletely evaluated on this examination. Soft tissues are unremarkable. IMPRESSION: No acute fracture or dislocation. Electronically Signed   By: Fidela Salisbury MD   On: 06/29/2020 21:27   CT Head Wo Contrast  Result Date: 06/29/2020 CLINICAL DATA:  Fall, head injury EXAM: CT HEAD WITHOUT CONTRAST CT CERVICAL SPINE WITHOUT CONTRAST TECHNIQUE: Multidetector CT imaging of the head and cervical spine was performed following the standard protocol without intravenous contrast. Multiplanar CT image reconstructions of the cervical spine were also generated. COMPARISON:  None. FINDINGS: CT HEAD FINDINGS Brain: Normal anatomic configuration. Parenchymal volume loss is commensurate with the patient's age. Moderate subcortical and periventricular white matter changes are present likely reflecting the sequela of small vessel ischemia. No abnormal intra or extra-axial mass lesion or fluid collection. No abnormal mass effect or midline shift. No evidence of acute intracranial hemorrhage or infarct. Ventricular  size is normal. Cerebellum unremarkable. Vascular: No asymmetric hyperdense vasculature at the skull base. Extensive atherosclerotic calcification noted. Skull: Intact Sinuses/Orbits: Paranasal sinuses are clear. Orbits are unremarkable. Other: Mastoid air cells and middle ear cavities are clear. CT CERVICAL SPINE FINDINGS Alignment: Grade 1 anterolisthesis of C6 upon C7 is likely degenerative in nature. Otherwise normal alignment. Skull base and vertebrae: Craniocervical junction is unremarkable. Atlantodental interval is normal. No acute fracture of the cervical spine. No lytic or blastic bone lesion. Soft tissues and spinal canal: No prevertebral fluid or swelling. No  visible canal hematoma. Disc levels: There is intervertebral disc space narrowing and endplate remodeling at W4-1 and C7-T1 in keeping with changes of advanced degenerative disc disease. Vertebral body height has been preserved. Review of the axial images demonstrates extensive multilevel uncovertebral and facet arthrosis with resultant multilevel neural foraminal narrowing. The spinal canal is widely patent. Upper chest: The visualized lung apices are clear. A a thoracic aortic arch aneurysm is incompletely visualized on this examination measuring at least 3.4 cm in greatest dimension. Other: None significant IMPRESSION: No acute intracranial injury.  No calvarial fracture. No acute fracture of the cervical spine. Incompletely evaluated thoracic aortic aneurysm. If indicated, this would be better assessed with dedicated CT arteriography of the chest. Aortic aneurysm NOS (ICD10-I71.9). Electronically Signed   By: Fidela Salisbury MD   On: 06/29/2020 21:26   CT Cervical Spine Wo Contrast  Result Date: 06/29/2020 CLINICAL DATA:  Fall, head injury EXAM: CT HEAD WITHOUT CONTRAST CT CERVICAL SPINE WITHOUT CONTRAST TECHNIQUE: Multidetector CT imaging of the head and cervical spine was performed following the standard protocol without intravenous  contrast. Multiplanar CT image reconstructions of the cervical spine were also generated. COMPARISON:  None. FINDINGS: CT HEAD FINDINGS Brain: Normal anatomic configuration. Parenchymal volume loss is commensurate with the patient's age. Moderate subcortical and periventricular white matter changes are present likely reflecting the sequela of small vessel ischemia. No abnormal intra or extra-axial mass lesion or fluid collection. No abnormal mass effect or midline shift. No evidence of acute intracranial hemorrhage or infarct. Ventricular size is normal. Cerebellum unremarkable. Vascular: No asymmetric hyperdense vasculature at the skull base. Extensive atherosclerotic calcification noted. Skull: Intact Sinuses/Orbits: Paranasal sinuses are clear. Orbits are unremarkable. Other: Mastoid air cells and middle ear cavities are clear. CT CERVICAL SPINE FINDINGS Alignment: Grade 1 anterolisthesis of C6 upon C7 is likely degenerative in nature. Otherwise normal alignment. Skull base and vertebrae: Craniocervical junction is unremarkable. Atlantodental interval is normal. No acute fracture of the cervical spine. No lytic or blastic bone lesion. Soft tissues and spinal canal: No prevertebral fluid or swelling. No visible canal hematoma. Disc levels: There is intervertebral disc space narrowing and endplate remodeling at L2-4 and C7-T1 in keeping with changes of advanced degenerative disc disease. Vertebral body height has been preserved. Review of the axial images demonstrates extensive multilevel uncovertebral and facet arthrosis with resultant multilevel neural foraminal narrowing. The spinal canal is widely patent. Upper chest: The visualized lung apices are clear. A a thoracic aortic arch aneurysm is incompletely visualized on this examination measuring at least 3.4 cm in greatest dimension. Other: None significant IMPRESSION: No acute intracranial injury.  No calvarial fracture. No acute fracture of the cervical spine.  Incompletely evaluated thoracic aortic aneurysm. If indicated, this would be better assessed with dedicated CT arteriography of the chest. Aortic aneurysm NOS (ICD10-I71.9). Electronically Signed   By: Fidela Salisbury MD   On: 06/29/2020 21:26   MR BRAIN WO CONTRAST  Result Date: 06/30/2020 CLINICAL DATA:  Syncope EXAM: MRI HEAD WITHOUT CONTRAST TECHNIQUE: Multiplanar, multiecho pulse sequences of the brain and surrounding structures were obtained without intravenous contrast. COMPARISON:  03/25/2020 FINDINGS: Brain: There is no acute infarction or intracranial hemorrhage. There is no parenchymal mass, mass effect, or edema. There is no hydrocephalus or extra-axial fluid collection. There is a mass at the right cerebellopontine angle extending into the right internal auditory canal. This appears to be similar in size on these large field-of-view images. Prominence of the ventricles and sulci reflects mild generalized  parenchymal volume loss. Patchy and confluent areas of T2 hyperintensity in the supratentorial greater than pontine white matter are nonspecific but probably reflect moderate to advanced chronic microvascular ischemic changes. Vascular: Major vessel flow voids at the skull base are preserved. Skull and upper cervical spine: Normal marrow signal is preserved. Sinuses/Orbits: Paranasal sinuses are aerated. Bilateral lens replacements. Other: Sella is unremarkable.  Mastoid air cells are clear. IMPRESSION: No evidence of recent infarction or hemorrhage. Moderate to advanced chronic microvascular ischemic changes. Similar size of right vestibular schwannoma. Electronically Signed   By: Macy Mis M.D.   On: 06/30/2020 08:42   MR ANGIO CHEST WO CONTRAST  Result Date: 06/30/2020 CLINICAL DATA:  86 year old female with a fall EXAM: MRA CHEST WITH OR WITHOUT CONTRAST TECHNIQUE: Angiographic images of the chest were obtained using MRA technique without intravenous contrast. CONTRAST:  None COMPARISON:   CT chest 06/27/2010 FINDINGS: VASCULAR Aorta: Artifact at the aortic valve compatible with aortic valve repair. Estimated diameter of the ascending aorta 3.6 cm. Distal thoracic aorta at the hiatus measures 2.3 cm. No evidence of periaortic fluid or inflammatory change. No evidence of dissection. Mild atherosclerosis as was seen on prior CT. Heart: Cardiomegaly. No pericardial fluid. Enlargement of the right-sided heart chambers, similar to the comparison CT chest. Pulmonary Arteries:  Main pulmonary artery measures 2.8 cm. NON-VASCULAR Mediastinum: No mediastinal adenopathy. Esophagus is not well evaluated. Lungs/pleura: No pleural effusion. Signal within the lungs unremarkable. Musculoskeletal: Artifact at the right glenohumeral joint secondary to prior arthroplasty. Visualized thoracic spine unremarkable on the coronal images. IMPRESSION: Aortic Atherosclerosis (ICD10-I70.0). MRA is negative for thoracic aortic aneurysm, with maximum diameter of the ascending aorta estimated 3.6 cm. Surgical changes of aortic valve repair. Cardiomegaly. Signed, Dulcy Fanny. Dellia Nims, RPVI [facility] Radiology Electronically Signed   By: Corrie Mckusick D.O.   On: 06/30/2020 09:29   DG Knee Complete 4 Views Right  Result Date: 06/29/2020 CLINICAL DATA:  Fall, right knee pain EXAM: RIGHT KNEE - COMPLETE 4+ VIEW COMPARISON:  None. FINDINGS: Four view radiograph right knee demonstrates normal alignment. No fracture or dislocation. Joint spaces are preserved. No effusion. Advanced vascular calcifications are seen within the posterior soft tissues. IMPRESSION: No acute fracture or dislocation. Electronically Signed   By: Fidela Salisbury MD   On: 06/29/2020 21:31   DG Hand Complete Right  Result Date: 06/29/2020 CLINICAL DATA:  Initial evaluation for acute trauma, fall. EXAM: RIGHT HAND - COMPLETE 3+ VIEW COMPARISON:  None. FINDINGS: Bones are severely osteopenic, somewhat limiting  assessment for possible subtle acute nondisplaced fractures. No acute fracture or dislocation. Prominent degenerative osteoarthritic changes noted at the first [facility] articulation. Few scattered juxta-articular erosions with overhanging edges noted at the right fourth and fifth MCP articulations, which could reflect changes related underlying gouty arthropathy. No visible soft tissue injury. Atherosclerotic change noted at the wrist. IMPRESSION: 1. No acute osseous abnormality about the right hand. 2. Severe osteopenia. 3. Few scattered juxta-articular erosions with overhanging edges at the right fourth and fifth MCP articulations, which could reflect changes related to underlying gouty arthropathy. Electronically Signed   By: Jeannine Boga M.D.   On: 06/29/2020 21:18     EKG: Independently reviewed.  Sinus rhythm, QTC 478, LAD, poor R wave progression.  Assessment/Plan Principal Problem:   Fall Active Problems:   HLD (hyperlipidemia)   Acute renal failure superimposed on stage 3b chronic kidney disease ([place])   History of gout   Fibrosis of lung ([place])  Depression   Blindness   Hypertension   Coronary artery disease   COPD (chronic obstructive pulmonary disease) ([facility])   Hypertensive urgency   Elevated troponin   PAF (paroxysmal atrial fibrillation) ([facility])   Chronic diastolic CHF (congestive heart failure) ([place])   Fall: Patient has had multiple falls in the past several months. Etiology is not clear.  Likely multifactorial etiology, including blindness, old age and possible syncope or presyncope, stroke. Patient has worsening renal function, mild bradycardia, elevated troponin, which may also contributed partially.  -Pt is admitted to med-surg as inpt by accepting MD -f/u MRI-brain to r/o stroke -2d echo -PT/OT -Orthostatic status -gentle IVF -wound care for skin tear  Possible thoracic aortic aneurysm [facility]): CT of C spin and head showed incompletely evaluated thoracic aortic  aneurysm.  -f/u MRA of abd/chest/pelvis  HLD (hyperlipidemia) -zocor  Acute renal failure superimposed on stage 3b chronic kidney disease ([place]): Recent baseline creatinine 1.4-1.6.  Her creatinine is 1.82, BUN 49, likely due to dehydration and continuation of her Lasix and Cozaar -Hold Lasix and Cozaar -Gentle IV fluid -Follow-up with edema  Chronic dCHF: 2D echo on 03/23/2020 showed EF is 65-70% with grade 1 diastolic dysfunction. Patient has trace leg edema, no SOB or JVD.  CHF seem to be compensated -Hold Lasix due to worsening renal function -Check BNP  History of gout: -Allopurinol  Fibrosis of lung and COPD: Stable -Bronchodilators -As needed Mucinex  Depression -Lexapro  Blindness: -Fall precaution  Hypertension and hypertensive urgency: initial Bp 231/86 which improved to 163/85 after giving 25 mg of Cozaar, 10 mg of oral hydralazine and nitro glycerin patch 15 mg in ED -IV hydralazine as needed -Hold Cozaar -Increase hydralazine dose from 10 mg to 25 mg twice daily -Continue metoprolol, but decrease metoprolol dose from 75 to 50 mg daily since patient has a mild bradycardia  Elevated troponin and CAD: no CP. Trop 20.  Likely due to demand ischemia -Trend troponin -Aspirin, Zocor -Check A1c, FLP -Repeat EKG in morning -Follow-up 2D echo  PAF (paroxysmal atrial fibrillation) ([place]): Heart rate 55, 66, not on anticoagulants at home. -Metoprolol 50 mg daily     DVT ppx: SCD Code Status: DNR per her niece who is POA Family Communication:   Yes, patient's  Niece by phone Disposition Plan:  Likely SNF Consults called:  none Admission status: Med-surg bed as inpt     Status is: Inpatient  Remains inpatient appropriate because:Inpatient level of care appropriate due to severity of illness.  Patient has multiple comorbidities, now presents with fall (patient had multiple fall in the past several months), worsening renal function, elevated troponin.  Given her old  age and blindness, patient likely needs placement to SNF.  Patient's presentation is highly complicated.  Given her old age and complicated medical history, patient is at high risk of deteriorating.  Patient will need to be treated in hospital for at least 2 days.   Dispo: The patient is from: Home              Anticipated d/c is to: to be determined, likely SNF              Anticipated d/c date is: 2 days              Patient currently is not medically stable to d/c.          Date of Service 06/30/2020    [facility]   If 7PM-7AM, please contact night-coverage [email or web address] 06/30/2020,  9:40 AM

## 2020-07-01 DIAGNOSIS — I350 Nonrheumatic aortic (valve) stenosis: Principal | ICD-10-CM

## 2020-07-01 DIAGNOSIS — F4329 Adjustment disorder with other symptoms: Secondary | ICD-10-CM | POA: Diagnosis not present

## 2020-07-01 DIAGNOSIS — N1832 Chronic kidney disease, stage 3b: Secondary | ICD-10-CM

## 2020-07-01 DIAGNOSIS — N179 Acute kidney failure, unspecified: Secondary | ICD-10-CM

## 2020-07-01 DIAGNOSIS — W19XXXA Unspecified fall, initial encounter: Secondary | ICD-10-CM

## 2020-07-01 LAB — BASIC METABOLIC PANEL
Anion gap: 8 (ref 5–15)
BUN: 38 mg/dL — ABNORMAL HIGH (ref 8–23)
CO2: 26 mmol/L (ref 22–32)
Calcium: 9 mg/dL (ref 8.9–10.3)
Chloride: 103 mmol/L (ref 98–111)
Creatinine, Ser: 1.53 mg/dL — ABNORMAL HIGH (ref 0.44–1.00)
GFR, Estimated: 29 mL/min — ABNORMAL LOW (ref >60)
Glucose, Bld: 97 mg/dL (ref 70–99)
Potassium: 3.8 mmol/L (ref 3.5–5.1)
Sodium: 137 mmol/L (ref 135–145)

## 2020-07-01 LAB — LIPID PANEL
Cholesterol: 233 mg/dL — ABNORMAL HIGH (ref 0–200)
HDL: 55 mg/dL (ref >40)
LDL Cholesterol: 167 mg/dL — ABNORMAL HIGH (ref 0–99)
Total CHOL/HDL Ratio: 4.2 RATIO
Triglycerides: 57 mg/dL (ref <150)
VLDL: 11 mg/dL (ref 0–40)

## 2020-07-01 LAB — CBC
HCT: 31.1 % — ABNORMAL LOW (ref 36.0–46.0)
Hemoglobin: 10.3 g/dL — ABNORMAL LOW (ref 12.0–15.0)
MCH: 30.7 pg (ref 26.0–34.0)
MCHC: 33.1 g/dL (ref 30.0–36.0)
MCV: 92.6 fL (ref 80.0–100.0)
Platelets: 195 10*3/uL (ref 150–400)
RBC: 3.36 MIL/uL — ABNORMAL LOW (ref 3.87–5.11)
RDW: 13.2 % (ref 11.5–15.5)
WBC: 6.4 10*3/uL (ref 4.0–10.5)
nRBC: 0 % (ref 0.0–0.2)

## 2020-07-01 LAB — HEMOGLOBIN A1C
Hgb A1c MFr Bld: 5.9 % — ABNORMAL HIGH (ref 4.8–5.6)
Mean Plasma Glucose: 122.63 mg/dL

## 2020-07-01 MED ORDER — IPRATROPIUM-ALBUTEROL 0.5-2.5 (3) MG/3ML IN SOLN
3.0000 mL | Freq: Two times a day (BID) | RESPIRATORY_TRACT | Status: DC
  Administered 2020-07-01: 3 mL via RESPIRATORY_TRACT
  Filled 2020-07-01: qty 3

## 2020-07-01 MED ORDER — ENOXAPARIN SODIUM 30 MG/0.3ML ~~LOC~~ SOLN
30.0000 mg | SUBCUTANEOUS | Status: DC
  Administered 2020-07-01 – 2020-07-02 (×2): 30 mg via SUBCUTANEOUS
  Filled 2020-07-01 (×2): qty 0.3

## 2020-07-01 NOTE — Consult Note (Signed)
[facility] Consult   Reason for Consult:  Capacity  Referring Physician:  Dr Blaine Hamper Patient Identification: Marilyn Oconnell MRN:  [id number] Principal Diagnosis: Fall Diagnosis:  Principal Problem:   Fall Active Problems:   Adjustment disorder with disturbance of emotion   HLD (hyperlipidemia)   Severe aortic stenosis   Acute renal failure superimposed on stage 3b chronic kidney disease ([place])   History of gout   Fibrosis of lung ([place])   Depression   Blindness   Hypertension   Coronary artery disease   COPD (chronic obstructive pulmonary disease) (H[facility])   Hypertensive urgency   Elevated troponin   PAF (paroxysmal atrial fibrillation) (H[facility])   Chronic diastolic CHF (congestive heart failure) ([place])   Total Time spent with patient: 45 minutes  Subjective:   Marilyn Oconnell is a 86 y.o. female patient admitted with fall issues.  Patient seen and evaluated in person by this provider.  She was calmly sitting up in bed enjoying a milkshake with her daughter-in-law at her bedside.  Client was alert and oriented with memory issues on assessment.  She stated, "I'm not ok with anything but my house" but understands the need to have 24 hour assistance to prevent future falls with deterioration.  She and her daughter-in-law were discussing plans for the best place for her to discharge to.  Client is reasonable and understands the need.  Let her know we are here to support her and will be happy to return if she needed someone to listen or discuss.  No suicidal/homicidal ideations, hallucinations, or substance use.    Note per SW: Clinical Narrative:                 Pt presents to ED due to fall. TOC competed assessment with pt and received collateral information from pts niece Oren Section [phone], Ms Dema Severin is also pts POA and has the legal documentation. Pt states that she lives alone and has some come into her home during the week to help her clean, wash clothes, and fixes her  meals. Pt states that she bathes, dresses, and feeds herself. Pt states she is bowel and urinary continent. Pt states that she has friends take her to her appointments and that her niece Ms. White is able to get her groceries and her medications. When asked what day it was pt knew it was Friday but stated the date was September 21st. Pt states that she does not want to go to a SNF. Pt states that she does not have [facility] services.   Per conversation with pts niece Oren Section, pt cannot return home at d/c. Ms. Dema Severin states that pt has an aide that comes in a few hours each day to help with housekeeping and Ms. White goes by on weekends. Ms. Dema Severin states that pt has fallen multiple times in the last few months and is not safe to be at home. Per Ms. White pt is legally blind. Ms. Dema Severin states that pt uses a walker at home but due to fall and hand injury pt cannot use the walker currently. Ms. Dema Severin states that pt often has Syncope episodes that cause her to fall and she is unable to tell when the episode is coming on. Ms. Dema Severin states that she cannot bring pt into her home because she works 12 hour shifts and pt would be alone too long. When speaking with Ms. White TOC was informed it would be helpful if hospital staff could explain to pt  why it is unsafe for her to return home. Ms. Dema Severin is hoping that pt will be agreeable to go to [facility] upon d/c. Ms. Dema Severin will be going to view [facility] and gave [facility] fax information [phone]. Ms. White is very concerned about the safety of pt returning home at d/c. [facility] informed Ms. White that if pt is alert and orientated pt can make decisions for herself. TOC to follow.    Past Psychiatric History: none  Risk to Self:  none Risk to Others:  none Prior Inpatient Therapy:  none Prior Outpatient Therapy:  none  Past Medical History:  Past Medical History:  Diagnosis Date  . Arthritis   . CHF (congestive heart failure) ([place])   . COPD (chronic  obstructive pulmonary disease) ([place])   . Coronary artery disease   . Hypercholesteremia   . Hypertension     Past Surgical History:  Procedure Laterality Date  . AORTIC VALVE REPLACEMENT  2006  . DILATION AND CURETTAGE OF UTERUS  1988  . SHOULDER SURGERY  09/1999  . SIGMOIDOSCOPY  1992   Family History:  Family History  Problem Relation Age of Onset  . Aneurysm Father   . Lung cancer Brother   . Prostate cancer Brother   . Heart failure Brother    Family Psychiatric  History: none Social History:  Social History   Substance and Sexual Activity  Alcohol Use No     Social History   Substance and Sexual Activity  Drug Use No    Social History   Socioeconomic History  . Marital status: Widowed    Spouse name: Not on file  . Number of children: Not on file  . Years of education: grade 8  . Highest education level: Not on file  Occupational History  . Not on file  Tobacco Use  . Smoking status: Never Smoker  . Smokeless tobacco: Never Used  Substance and Sexual Activity  . Alcohol use: No  . Drug use: No  . Sexual activity: Not on file  Other Topics Concern  . Not on file  Social History Narrative  . Not on file   Social Determinants of Health   Financial Resource Strain:   . Difficulty of Paying Living Expenses: Not on file  Food Insecurity:   . Worried About Charity fundraiser in the Last Year: Not on file  . Ran Out of Food in the Last Year: Not on file  Transportation Needs:   . Lack of Transportation (Medical): Not on file  . Lack of Transportation (Non-Medical): Not on file  Physical Activity:   . Days of Exercise per Week: Not on file  . Minutes of Exercise per Session: Not on file  Stress:   . Feeling of Stress : Not on file  Social Connections:   . Frequency of Communication with Friends and Family: Not on file  . Frequency of Social Gatherings with Friends and Family: Not on file  . Attends Religious Services: Not on file  . Active Member  of Clubs or Organizations: Not on file  . Attends Archivist Meetings: Not on file  . Marital Status: Not on file   Additional Social History:    Allergies:   Allergies  Allergen Reactions  . Iodine Anaphylaxis  . Shellfish Allergy Anaphylaxis  . Azithromycin Hives  . Sulfa Antibiotics Hives    Labs:  Results for orders placed or performed during the hospital encounter of 06/30/20 (from the past  48 hour(s))  CBC     Status: Abnormal   Collection Time: 06/29/20  7:56 PM  Result Value Ref Range   WBC 7.4 4.0 - 10.5 K/uL   RBC 3.72 (L) 3.87 - 5.11 MIL/uL   Hemoglobin 11.5 (L) 12.0 - 15.0 g/dL   HCT 34.8 (L) 36 - 46 %   MCV 93.5 80.0 - 100.0 fL   MCH 30.9 26.0 - 34.0 pg   MCHC 33.0 30.0 - 36.0 g/dL   RDW 13.1 11.5 - 15.5 %   Platelets 232 150 - 400 K/uL   nRBC 0.0 0.0 - 0.2 %    Comment: Performed at [facility], [address] [place], [place] [address]  Basic metabolic panel     Status: Abnormal   Collection Time: 06/29/20  7:56 PM  Result Value Ref Range   Sodium 137 135 - 145 mmol/L   Potassium 4.0 3.5 - 5.1 mmol/L   Chloride 98 98 - 111 mmol/L   CO2 26 22 - 32 mmol/L   Glucose, Bld 165 (H) 70 - 99 mg/dL    Comment: Glucose reference range applies only to samples taken after fasting for at least 8 hours.   BUN 49 (H) 8 - 23 mg/dL   Creatinine, Ser 1.82 (H) 0.44 - 1.00 mg/dL   Calcium 9.2 8.9 - 10.3 mg/dL   GFR calc non Af Amer 23 (L) >60 mL/min   Anion gap 13 5 - 15    Comment: Performed at [facility], [place], [place] [address]  Troponin I (High Sensitivity)     Status: Abnormal   Collection Time: 06/29/20  7:56 PM  Result Value Ref Range   Troponin I (High Sensitivity) 20 (H) <18 ng/L    Comment: (NOTE) Elevated high sensitivity troponin I (hsTnI) values and significant  changes across serial measurements may suggest ACS but many other  chronic and acute conditions are known to elevate hsTnI results.  Refer to  the "Links" section for chest pain algorithms and additional  guidance. Performed at [facility], [place], [place] [address]   Troponin I (High Sensitivity)     Status: Abnormal   Collection Time: 06/30/20  7:30 AM  Result Value Ref Range   Troponin I (High Sensitivity) 24 (H) <18 ng/L    Comment: (NOTE) Elevated high sensitivity troponin I (hsTnI) values and significant  changes across serial measurements may suggest ACS but many other  chronic and acute conditions are known to elevate hsTnI results.  Refer to the "Links" section for chest pain algorithms and additional  guidance. Performed at [facility], [address] [place], [place] [address]   Brain natriuretic peptide     Status: Abnormal   Collection Time: 06/30/20  7:30 AM  Result Value Ref Range   B Natriuretic Peptide 690.1 (H) 0.0 - 100.0 pg/mL    Comment: Performed at [facility], [address] [place], [place] [address]  Respiratory Panel by RT PCR (Flu A&B, Covid) - Nasopharyngeal Swab     Status: None   Collection Time: 06/30/20  7:32 AM   Specimen: Nasopharyngeal Swab  Result Value Ref Range   SARS Coronavirus 2 by RT PCR NEGATIVE NEGATIVE    Comment: (NOTE) SARS-CoV-2 target nucleic acids are NOT DETECTED.  The SARS-CoV-2 RNA is generally detectable in upper respiratoy specimens during the acute phase of infection. The lowest concentration of SARS-CoV-2 viral copies this assay can detect is  131 copies/mL. A negative result does not preclude SARS-Cov-2 infection and should not be used as the sole basis for treatment or other patient management decisions. A negative result may occur with  improper specimen collection/handling, submission of specimen other than nasopharyngeal swab, presence of viral mutation(s) within the areas targeted by this assay, and inadequate number of viral copies (<131 copies/mL). A negative result must be combined with  clinical observations, patient history, and epidemiological information. The expected result is Negative.  Fact Sheet for Patients:  PinkCheek.be  Fact Sheet for Healthcare Providers:  GravelBags.it  This test is no t yet approved or cleared by the [place] FDA and  has been authorized for detection and/or diagnosis of SARS-CoV-2 by FDA under an Emergency Use Authorization (EUA). This EUA will remain  in effect (meaning this test can be used) for the duration of the COVID-19 declaration under Section 564(b)(1) of the Act, 21 U.S.C. section 360bbb-3(b)(1), unless the authorization is terminated or revoked sooner.     Influenza A by PCR NEGATIVE NEGATIVE   Influenza B by PCR NEGATIVE NEGATIVE    Comment: (NOTE) The Xpert Xpress SARS-CoV-2/FLU/RSV assay is intended as an aid in  the diagnosis of influenza from Nasopharyngeal swab specimens and  should not be used as a sole basis for treatment. Nasal washings and  aspirates are unacceptable for Xpert Xpress SARS-CoV-2/FLU/RSV  testing.  Fact Sheet for Patients: PinkCheek.be  Fact Sheet for Healthcare Providers: GravelBags.it  This test is not yet approved or cleared by the [place] FDA and  has been authorized for detection and/or diagnosis of SARS-CoV-2 by  FDA under an Emergency Use Authorization (EUA). This EUA will remain  in effect (meaning this test can be used) for the duration of the  Covid-19 declaration under Section 564(b)(1) of the Act, 21  U.S.C. section 360bbb-3(b)(1), unless the authorization is  terminated or revoked. Performed at [facility], [address] [place], [place] [address]   Troponin I (High Sensitivity)     Status: Abnormal   Collection Time: 06/30/20 11:17 AM  Result Value Ref Range   Troponin I (High Sensitivity) 22 (H) <18 ng/L    Comment: (NOTE) Elevated  high sensitivity troponin I (hsTnI) values and significant  changes across serial measurements may suggest ACS but many other  chronic and acute conditions are known to elevate hsTnI results.  Refer to the "Links" section for chest pain algorithms and additional  guidance. Performed at [facility], [place], [place] [address]   Troponin I (High Sensitivity)     Status: Abnormal   Collection Time: 06/30/20  1:27 PM  Result Value Ref Range   Troponin I (High Sensitivity) 19 (H) <18 ng/L    Comment: (NOTE) Elevated high sensitivity troponin I (hsTnI) values and significant  changes across serial measurements may suggest ACS but many other  chronic and acute conditions are known to elevate hsTnI results.  Refer to the "Links" section for chest pain algorithms and additional  guidance. Performed at [facility], [address] [place], [place] [address]   Hemoglobin A1c     Status: Abnormal   Collection Time: 07/01/20  4:53 AM  Result Value Ref Range   Hgb A1c MFr Bld 5.9 (H) 4.8 - 5.6 %    Comment: (NOTE) Pre diabetes:          5.7%-6.4%  Diabetes:              >6.4%  Glycemic  control for   <7.0% adults with diabetes    Mean Plasma Glucose 122.63 mg/dL    Comment: Performed at [facility] [address], [place], [place] [address]  Lipid panel     Status: Abnormal   Collection Time: 07/01/20  4:53 AM  Result Value Ref Range   Cholesterol 233 (H) 0 - 200 mg/dL   Triglycerides 57 <150 mg/dL   HDL 55 >40 mg/dL   Total CHOL/HDL Ratio 4.2 RATIO   VLDL 11 0 - 40 mg/dL   LDL Cholesterol 167 (H) 0 - 99 mg/dL    Comment:        Total Cholesterol/HDL:CHD Risk Coronary Heart Disease Risk Table                     Men   Women  1/2 Average Risk   3.4   3.3  Average Risk       5.0   4.4  2 X Average Risk   9.6   7.1  3 X Average Risk  23.4   11.0        Use the calculated Patient Ratio above and the CHD Risk Table to determine the  patient's CHD Risk.        ATP III CLASSIFICATION (LDL):  <100     mg/dL   Optimal  100-129  mg/dL   Near or Above                    Optimal  130-159  mg/dL   Borderline  160-189  mg/dL   High  >190     mg/dL   Very High Performed at [facility], [address] [place], [place] [address]   Basic metabolic panel     Status: Abnormal   Collection Time: 07/01/20  4:53 AM  Result Value Ref Range   Sodium 137 135 - 145 mmol/L   Potassium 3.8 3.5 - 5.1 mmol/L   Chloride 103 98 - 111 mmol/L   CO2 26 22 - 32 mmol/L   Glucose, Bld 97 70 - 99 mg/dL    Comment: Glucose reference range applies only to samples taken after fasting for at least 8 hours.   BUN 38 (H) 8 - 23 mg/dL   Creatinine, Ser 1.53 (H) 0.44 - 1.00 mg/dL   Calcium 9.0 8.9 - 10.3 mg/dL   GFR, Estimated 29 (L) >60 mL/min   Anion gap 8 5 - 15    Comment: Performed at [facility], [address] [place], [place] [address]  CBC     Status: Abnormal   Collection Time: 07/01/20  4:53 AM  Result Value Ref Range   WBC 6.4 4.0 - 10.5 K/uL   RBC 3.36 (L) 3.87 - 5.11 MIL/uL   Hemoglobin 10.3 (L) 12.0 - 15.0 g/dL   HCT 31.1 (L) 36 - 46 %   MCV 92.6 80.0 - 100.0 fL   MCH 30.7 26.0 - 34.0 pg   MCHC 33.1 30.0 - 36.0 g/dL   RDW 13.2 11.5 - 15.5 %   Platelets 195 150 - 400 K/uL   nRBC 0.0 0.0 - 0.2 %    Comment: Performed at [facility], [address], [place], [place] [address]    Current Facility-Administered Medications  Medication Dose Route Frequency Provider Last Rate Last Admin  . acetaminophen (TYLENOL) tablet 650 mg  650 mg Oral Q6H PRN Ivor Costa, MD      .  albuterol (VENTOLIN HFA) 108 (90 Base) MCG/ACT inhaler 2 puff  2 puff Inhalation Q4H PRN Ivor Costa, MD      . allopurinol (ZYLOPRIM) tablet 100 mg  100 mg Oral Daily Ivor Costa, MD   100 mg at 07/01/20 1016  . aspirin EC tablet 81 mg  81 mg Oral Daily Ivor Costa, MD   81 mg at 07/01/20 1015  . budesonide (PULMICORT) nebulizer  solution 0.5 mg  0.5 mg Nebulization QHS Ivor Costa, MD   0.5 mg at 06/30/20 2045  . dextromethorphan-guaiFENesin (MUCINEX DM) 30-600 MG per 12 hr tablet 1 tablet  1 tablet Oral BID PRN Ivor Costa, MD      . enoxaparin (LOVENOX) injection 30 mg  30 mg Subcutaneous Q24H Sharen Hones, MD      . escitalopram (LEXAPRO) tablet 20 mg  20 mg Oral Daily Ivor Costa, MD   20 mg at 07/01/20 1016  . hydrALAZINE (APRESOLINE) injection 5 mg  5 mg Intravenous Q2H PRN Ivor Costa, MD      . hydrALAZINE (APRESOLINE) tablet 25 mg  25 mg Oral BID Ivor Costa, MD   25 mg at 07/01/20 1016  . ipratropium-albuterol (DUONEB) 0.5-2.5 (3) MG/3ML nebulizer solution 3 mL  3 mL Nebulization BID Sharen Hones, MD      . metoprolol succinate (TOPROL-XL) 24 hr tablet 50 mg  50 mg Oral Daily Ivor Costa, MD   50 mg at 07/01/20 1015  . ondansetron (ZOFRAN) injection 4 mg  4 mg Intravenous Q8H PRN Ivor Costa, MD      . simvastatin (ZOCOR) tablet 20 mg  20 mg Oral QHS Ivor Costa, MD   20 mg at 06/30/20 2309  . traZODone (DESYREL) tablet 25 mg  25 mg Oral QHS PRN Ivor Costa, MD        Musculoskeletal: Strength & Muscle Tone: within normal limits Gait & Station: did not witness Patient leans: N/A  Psychiatric Specialty Exam: Physical Exam Vitals and nursing note reviewed.  Constitutional:      Appearance: Normal appearance.  HENT:     Head: Normocephalic.     Nose: Nose normal.  Pulmonary:     Effort: Pulmonary effort is normal.  Musculoskeletal:        General: Normal range of motion.     Cervical back: Normal range of motion.  Neurological:     General: No focal deficit present.     Mental Status: She is alert and oriented to person, place, and time.  Psychiatric:        Attention and Perception: Attention and perception normal.        Mood and Affect: Mood is anxious.        Speech: Speech normal.        Behavior: Behavior normal. Behavior is cooperative.        Thought Content: Thought content normal.         Cognition and Memory: Cognition and memory normal.        Judgment: Judgment normal.     Review of Systems  Psychiatric/Behavioral: The patient is nervous/anxious.   All other systems reviewed and are negative.   Blood pressure (!) 152/89, pulse 70, temperature 98.2 F (36.8 C), resp. rate 17, height 5\' 1"  (1.549 m), weight 57.6 kg, SpO2 95 %.Body mass index is 24 kg/m.  General Appearance: Casual  Eye Contact:  Good  Speech:  Normal Rate  Volume:  Normal  Mood:  Anxious  Affect:  Congruent  Thought Process:  Coherent and Descriptions of Associations: Intact  Orientation:  Full (Time, Place, and Person)  Thought Content:  WDL and Logical  Suicidal Thoughts:  No  Homicidal Thoughts:  No  Memory:  Immediate;   Good Recent;   Good Remote;   Good  Judgement:  Fair  Insight:  Fair  Psychomotor Activity:  Normal  Concentration:  Concentration: Good and Attention Span: Good  Recall:  Good  Fund of Knowledge:  Good  Language:  Good  Akathisia:  No  Handed:  Right  AIMS (if indicated):     Assets:  Leisure Time Resilience Social Support  ADL's:  Intact  Cognition:  WNL  Sleep:        Treatment Plan Summary: Adjustment disorder with mixed emotions: -Alert and oriented times four, she has mental capacity to make independent medical decisions.  She does not like the fact she will be leaving her home yet understands the necessity of needing a facility to assist her to prevent falls and farther deterioration.    Disposition: No evidence of imminent risk to self or others at present.   Patient does not meet criteria for psychiatric inpatient admission.  Waylan Boga, NP 07/01/2020 2:40 PM

## 2020-07-01 NOTE — Progress Notes (Signed)
PROGRESS NOTE    [name]  QIO:[id number] DOB: [birth year] DOA: 06/30/2020 PCP: Trinna Post, PA-C   Chief complaint.  Frequent falls. Brief Narrative:  Marilyn Oconnell is a 86 y.o. female with medical history significant of legally blindness, hypertension, hyperlipidemia, COPD, asthma, gout, depression, CAD, dCHF, chronic back pain, aortic stenosis  CKD stage III, pulmonary fibrosis, atrial fibrillation not on anticoagulants, possible vestibular schwannoma, who presents with fall. 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  She has been having frequent falls at home.   She had echocardiogram recently in July, showed a severe aortic stenosis and moderate pulmonary hypertension. Patient frequent falls most likely due to syncope from severe aortic stenosis.  Mitral valve probably also reached its lifespan.  Patient long-term prognosis very poor.  Will obtain palliative care consult.    Assessment & Plan:   Principal Problem:   Fall Active Problems:   HLD (hyperlipidemia)   Acute renal failure superimposed on stage 3b chronic kidney disease (HCC)   History of gout   Fibrosis of lung (HCC)   Depression   Blindness   Hypertension   Coronary artery disease   COPD (chronic obstructive pulmonary disease) (HCC)   Hypertensive urgency   Elevated troponin   PAF (paroxysmal atrial fibrillation) (HCC)   Chronic diastolic CHF (congestive heart failure) ([place])  #1.  Frequent falls. Most likely due to severe aortic stenosis and syncope.  Currently, there is no treatment options.  #2.  Severe aortic stenosis with moderate pulmonary hypertension. Patient most likely will also have dysfunctioned mitral valve.  At this point, patient is not a surgical candidate. We will obtain palliative care consult.  #3.  Thoracic aortic aneurysm. No treatment option.  4.  Acute renal failure on chronic kidney disease stage IIIb. Renal function improved.  Will  discontinue IV fluids.  5.  COPD and pulmonary fibrosis. Stable.  6.  Chronic diastolic congestive heart failure. Mild elevation of BNP due to kidney disease.  No evidence of volume overload or exacerbation per  7.  Hypertension urgency pain Continue current blood pressure medicines per  8.  Elevated troponin. Likely due to demand ischemia from severe aortic stenosis.  9.  Paroxysmal atrial fibrillation. Currently in sinus, not currently on anticoagulation.  Goals of care discussion with patient niece: Patient condition appears to be terminal, there is no treatment option for the patient due to severe valvular diseases, old age. Currently, patient lives alone, it is impossible to return home at this time.  No discharge option.    DVT prophylaxis: Lovenox Code Status: DNR Family Communication: As above  .   Status is: Inpatient  Remains inpatient appropriate because:Unsafe d/c plan   Dispo: The patient is from: Home              Anticipated d/c is to: ?              Anticipated d/c date is: 2 days              Patient currently is medically stable to d/c.        I/O last 3 completed shifts: In: 153.6 [I.V.:153.6] Out: -  No intake/output data recorded.     Consultants:   None  Procedures: None  Antimicrobials: None  Subjective: Patient just woke up, has some confusion. She denies any chest pain or shortness of breath. No abdominal pain or nausea vomiting. No fever or chills. No dysuria hematuria. No headache  or dizziness.  Objective: Vitals:   06/30/20 2047 06/30/20 2257 07/01/20 0326 07/01/20 0724  BP:  (!) 171/57 (!) 148/63 (!) 152/89  Pulse:  66 70 70  Resp:  18 19 17   Temp:  98.4 F (36.9 C) 98.1 F (36.7 C) 98.2 F (36.8 C)  TempSrc:  Oral Oral   SpO2: 95% 95% 95% 95%  Weight:      Height:        Intake/Output Summary (Last 24 hours) at 07/01/2020 1006 Last data filed at 06/30/2020 1547 Gross per 24 hour  Intake 153.64 ml    Output --  Net 153.64 ml   Filed Weights   06/29/20 1955  Weight: 57.6 kg    Examination:  General exam: Appears calm and comfortable  Respiratory system: Decreased breathing sounds.  Respiratory effort normal. Cardiovascular system: S1 & S2 heard, RRR. 2/6 SM at RUSB, No pedal edema. Gastrointestinal system: Abdomen is nondistended, soft and nontender. No organomegaly or masses felt. Normal bowel sounds heard. Central nervous system: Alert and oriented x2. No focal neurological deficits. Extremities: Symmetric  Skin: No rashes, lesions or ulcers Psychiatry:  Mood & affect appropriate.     Data Reviewed: I have personally reviewed following labs and imaging studies  CBC: Recent Labs  Lab 06/29/20 1956 07/01/20 0453  WBC 7.4 6.4  HGB 11.5* 10.3*  HCT 34.8* 31.1*  MCV 93.5 92.6  PLT 232 109   Basic Metabolic Panel: Recent Labs  Lab 06/29/20 1956 07/01/20 0453  NA 137 137  K 4.0 3.8  CL 98 103  CO2 26 26  GLUCOSE 165* 97  BUN 49* 38*  CREATININE 1.82* 1.53*  CALCIUM 9.2 9.0   GFR: Estimated Creatinine Clearance: 18.4 mL/min (A) (by C-G formula based on SCr of 1.53 mg/dL (H)). Liver Function Tests: No results for input(s): AST, ALT, ALKPHOS, BILITOT, PROT, ALBUMIN in the last 168 hours. No results for input(s): LIPASE, AMYLASE in the last 168 hours. No results for input(s): AMMONIA in the last 168 hours. Coagulation Profile: No results for input(s): INR, PROTIME in the last 168 hours. Cardiac Enzymes: No results for input(s): CKTOTAL, CKMB, CKMBINDEX, TROPONINI in the last 168 hours. BNP (last 3 results) No results for input(s): PROBNP in the last 8760 hours. HbA1C: No results for input(s): HGBA1C in the last 72 hours. CBG: No results for input(s): GLUCAP in the last 168 hours. Lipid Profile: Recent Labs    07/01/20 0453  CHOL 233*  HDL 55  LDLCALC 167*  TRIG 57  CHOLHDL 4.2   Thyroid Function Tests: No results for input(s): TSH, T4TOTAL,  FREET4, T3FREE, THYROIDAB in the last 72 hours. Anemia Panel: No results for input(s): VITAMINB12, FOLATE, FERRITIN, TIBC, IRON, RETICCTPCT in the last 72 hours. Sepsis Labs: No results for input(s): PROCALCITON, LATICACIDVEN in the last 168 hours.  Recent Results (from the past 240 hour(s))  Respiratory Panel by RT PCR (Flu A&B, Covid) - Nasopharyngeal Swab     Status: None   Collection Time: 06/30/20  7:32 AM   Specimen: Nasopharyngeal Swab  Result Value Ref Range Status   SARS Coronavirus 2 by RT PCR NEGATIVE NEGATIVE Final    Comment: (NOTE) SARS-CoV-2 target nucleic acids are NOT DETECTED.  The SARS-CoV-2 RNA is generally detectable in upper respiratoy specimens during the acute phase of infection. The lowest concentration of SARS-CoV-2 viral copies this assay can detect is 131 copies/mL. A negative result does not preclude SARS-Cov-2 infection and should not be used as the sole  basis for treatment or other patient management decisions. A negative result may occur with  improper specimen collection/handling, submission of specimen other than nasopharyngeal swab, presence of viral mutation(s) within the areas targeted by this assay, and inadequate number of viral copies (<131 copies/mL). A negative result must be combined with clinical observations, patient history, and epidemiological information. The expected result is Negative.  Fact Sheet for Patients:  PinkCheek.be  Fact Sheet for Healthcare Providers:  GravelBags.it  This test is no t yet approved or cleared by the [place] FDA and  has been authorized for detection and/or diagnosis of SARS-CoV-2 by FDA under an Emergency Use Authorization (EUA). This EUA will remain  in effect (meaning this test can be used) for the duration of the COVID-19 declaration under Section 564(b)(1) of the Act, 21 U.S.C. section 360bbb-3(b)(1), unless the authorization is  terminated or revoked sooner.     Influenza A by PCR NEGATIVE NEGATIVE Final   Influenza B by PCR NEGATIVE NEGATIVE Final    Comment: (NOTE) The Xpert Xpress SARS-CoV-2/FLU/RSV assay is intended as an aid in  the diagnosis of influenza from Nasopharyngeal swab specimens and  should not be used as a sole basis for treatment. Nasal washings and  aspirates are unacceptable for Xpert Xpress SARS-CoV-2/FLU/RSV  testing.  Fact Sheet for Patients: PinkCheek.be  Fact Sheet for Healthcare Providers: GravelBags.it  This test is not yet approved or cleared by the [place] FDA and  has been authorized for detection and/or diagnosis of SARS-CoV-2 by  FDA under an Emergency Use Authorization (EUA). This EUA will remain  in effect (meaning this test can be used) for the duration of the  Covid-19 declaration under Section 564(b)(1) of the Act, 21  U.S.C. section 360bbb-3(b)(1), unless the authorization is  terminated or revoked. Performed at [facility], [address] [place], [place] [address]          Radiology Studies: DG Shoulder Right  Result Date: 06/29/2020 CLINICAL DATA:  Fall, right shoulder pain EXAM: RIGHT SHOULDER - 2+ VIEW COMPARISON:  None. FINDINGS: Right shoulder arthroplasty has been performed. Normal alignment. No acute fracture or dislocation. Limited evaluation of the right hemithorax is unremarkable. IMPRESSION: No acute fracture or dislocation. Electronically Signed   By: Fidela Salisbury MD   On: 06/29/2020 21:30   DG Elbow Complete Right  Result Date: 06/29/2020 CLINICAL DATA:  Fall, right elbow pain EXAM: RIGHT ELBOW - COMPLETE 3+ VIEW COMPARISON:  None. FINDINGS: Five view radiograph of the right elbow demonstrates normal alignment. No acute fracture or dislocation. Joint spaces are preserved. No effusion. Healed right humeral diaphyseal fracture with residual deformity is incompletely  evaluated on this examination. Soft tissues are unremarkable. IMPRESSION: No acute fracture or dislocation. Electronically Signed   By: Fidela Salisbury MD   On: 06/29/2020 21:27   CT Head Wo Contrast  Result Date: 06/29/2020 CLINICAL DATA:  Fall, head injury EXAM: CT HEAD WITHOUT CONTRAST CT CERVICAL SPINE WITHOUT CONTRAST TECHNIQUE: Multidetector CT imaging of the head and cervical spine was performed following the standard protocol without intravenous contrast. Multiplanar CT image reconstructions of the cervical spine were also generated. COMPARISON:  None. FINDINGS: CT HEAD FINDINGS Brain: Normal anatomic configuration. Parenchymal volume loss is commensurate with the patient's age. Moderate subcortical and periventricular white matter changes are present likely reflecting the sequela of small vessel ischemia. No abnormal intra or extra-axial mass lesion or fluid collection. No abnormal mass effect or midline shift. No evidence of acute intracranial hemorrhage  or infarct. Ventricular size is normal. Cerebellum unremarkable. Vascular: No asymmetric hyperdense vasculature at the skull base. Extensive atherosclerotic calcification noted. Skull: Intact Sinuses/Orbits: Paranasal sinuses are clear. Orbits are unremarkable. Other: Mastoid air cells and middle ear cavities are clear. CT CERVICAL SPINE FINDINGS Alignment: Grade 1 anterolisthesis of C6 upon C7 is likely degenerative in nature. Otherwise normal alignment. Skull base and vertebrae: Craniocervical junction is unremarkable. Atlantodental interval is normal. No acute fracture of the cervical spine. No lytic or blastic bone lesion. Soft tissues and spinal canal: No prevertebral fluid or swelling. No visible canal hematoma. Disc levels: There is intervertebral disc space narrowing and endplate remodeling at R6-0 and C7-T1 in keeping with changes of advanced degenerative disc disease. Vertebral body height has been preserved. Review of the axial images  demonstrates extensive multilevel uncovertebral and facet arthrosis with resultant multilevel neural foraminal narrowing. The spinal canal is widely patent. Upper chest: The visualized lung apices are clear. A a thoracic aortic arch aneurysm is incompletely visualized on this examination measuring at least 3.4 cm in greatest dimension. Other: None significant IMPRESSION: No acute intracranial injury.  No calvarial fracture. No acute fracture of the cervical spine. Incompletely evaluated thoracic aortic aneurysm. If indicated, this would be better assessed with dedicated CT arteriography of the chest. Aortic aneurysm NOS (ICD10-I71.9). Electronically Signed   By: Fidela Salisbury MD   On: 06/29/2020 21:26   CT Cervical Spine Wo Contrast  Result Date: 06/29/2020 CLINICAL DATA:  Fall, head injury EXAM: CT HEAD WITHOUT CONTRAST CT CERVICAL SPINE WITHOUT CONTRAST TECHNIQUE: Multidetector CT imaging of the head and cervical spine was performed following the standard protocol without intravenous contrast. Multiplanar CT image reconstructions of the cervical spine were also generated. COMPARISON:  None. FINDINGS: CT HEAD FINDINGS Brain: Normal anatomic configuration. Parenchymal volume loss is commensurate with the patient's age. Moderate subcortical and periventricular white matter changes are present likely reflecting the sequela of small vessel ischemia. No abnormal intra or extra-axial mass lesion or fluid collection. No abnormal mass effect or midline shift. No evidence of acute intracranial hemorrhage or infarct. Ventricular size is normal. Cerebellum unremarkable. Vascular: No asymmetric hyperdense vasculature at the skull base. Extensive atherosclerotic calcification noted. Skull: Intact Sinuses/Orbits: Paranasal sinuses are clear. Orbits are unremarkable. Other: Mastoid air cells and middle ear cavities are clear. CT CERVICAL SPINE FINDINGS Alignment: Grade 1 anterolisthesis of C6 upon C7 is likely degenerative  in nature. Otherwise normal alignment. Skull base and vertebrae: Craniocervical junction is unremarkable. Atlantodental interval is normal. No acute fracture of the cervical spine. No lytic or blastic bone lesion. Soft tissues and spinal canal: No prevertebral fluid or swelling. No visible canal hematoma. Disc levels: There is intervertebral disc space narrowing and endplate remodeling at A5-4 and C7-T1 in keeping with changes of advanced degenerative disc disease. Vertebral body height has been preserved. Review of the axial images demonstrates extensive multilevel uncovertebral and facet arthrosis with resultant multilevel neural foraminal narrowing. The spinal canal is widely patent. Upper chest: The visualized lung apices are clear. A a thoracic aortic arch aneurysm is incompletely visualized on this examination measuring at least 3.4 cm in greatest dimension. Other: None significant IMPRESSION: No acute intracranial injury.  No calvarial fracture. No acute fracture of the cervical spine. Incompletely evaluated thoracic aortic aneurysm. If indicated, this would be better assessed with dedicated CT arteriography of the chest. Aortic aneurysm NOS (ICD10-I71.9). Electronically Signed   By: Fidela Salisbury MD   On: 06/29/2020 21:26   [place]  CONTRAST  Result Date: 06/30/2020 CLINICAL DATA:  Syncope EXAM: MRI HEAD WITHOUT CONTRAST TECHNIQUE: Multiplanar, multiecho pulse sequences of the brain and surrounding structures were obtained without intravenous contrast. COMPARISON:  03/25/2020 FINDINGS: Brain: There is no acute infarction or intracranial hemorrhage. There is no parenchymal mass, mass effect, or edema. There is no hydrocephalus or extra-axial fluid collection. There is a mass at the right cerebellopontine angle extending into the right internal auditory canal. This appears to be similar in size on these large field-of-view images. Prominence of the ventricles and sulci reflects mild generalized  parenchymal volume loss. Patchy and confluent areas of T2 hyperintensity in the supratentorial greater than pontine white matter are nonspecific but probably reflect moderate to advanced chronic microvascular ischemic changes. Vascular: Major vessel flow voids at the skull base are preserved. Skull and upper cervical spine: Normal marrow signal is preserved. Sinuses/Orbits: Paranasal sinuses are aerated. Bilateral lens replacements. Other: Sella is unremarkable.  Mastoid air cells are clear. IMPRESSION: No evidence of recent infarction or hemorrhage. Moderate to advanced chronic microvascular ischemic changes. Similar size of right vestibular schwannoma. Electronically Signed   By: Macy Mis M.D.   On: 06/30/2020 08:42   MR ANGIO CHEST WO CONTRAST  Result Date: 06/30/2020 CLINICAL DATA:  86 year old female with a fall EXAM: MRA CHEST WITH OR WITHOUT CONTRAST TECHNIQUE: Angiographic images of the chest were obtained using MRA technique without intravenous contrast. CONTRAST:  None COMPARISON:  CT chest 06/27/2010 FINDINGS: VASCULAR Aorta: Artifact at the aortic valve compatible with aortic valve repair. Estimated diameter of the ascending aorta 3.6 cm. Distal thoracic aorta at the hiatus measures 2.3 cm. No evidence of periaortic fluid or inflammatory change. No evidence of dissection. Mild atherosclerosis as was seen on prior CT. Heart: Cardiomegaly. No pericardial fluid. Enlargement of the right-sided heart chambers, similar to the comparison CT chest. Pulmonary Arteries:  Main pulmonary artery measures 2.8 cm. NON-VASCULAR Mediastinum: No mediastinal adenopathy. Esophagus is not well evaluated. Lungs/pleura: No pleural effusion. Signal within the lungs unremarkable. Musculoskeletal: Artifact at the right glenohumeral joint secondary to prior arthroplasty. Visualized thoracic spine unremarkable on the coronal images. IMPRESSION: Aortic Atherosclerosis (ICD10-I70.0). MRA is negative for thoracic aortic  aneurysm, with maximum diameter of the ascending aorta estimated 3.6 cm. Surgical changes of aortic valve repair. Cardiomegaly. Signed, Dulcy Fanny. Dellia Nims, RPVI [facility] Radiology Electronically Signed   By: Corrie Mckusick D.O.   On: 06/30/2020 09:29   MR ANGIO PELVIS WO CONTRAST  Result Date: 06/30/2020 CLINICAL DATA:  86 year old female with a fall EXAM: MRA ABDOMEN AND PELVIS WITHOUT CONTRAST TECHNIQUE: Multiplanar, multiecho pulse sequences of the abdomen and pelvis were obtained without intravenous contrast. Angiographic images of abdomen and pelvis were obtained using MRA technique without intravenous contrast. CONTRAST:  None COMPARISON:  No prior CT imaging FINDINGS: MRA abdomen and pelvis Vascular: Aorta: Diameter at the hiatus measures 2.3 cm. Diameter of the juxtarenal aorta measures 1.6 cm. Infrarenal abdominal aorta at the bifurcation measures 13 mm. There is no inflammatory signal in the periaortic region with fat signal maintained. Scattered signal loss within the aortic wall compatible with calcified atherosclerotic plaque. No evidence of dissection. Celiac artery: Flow signal maintained in the proximal celiac artery. Study is nondiagnostic for evaluation of any possible stenosis at the origin. SMA: Flow signal maintained in the SMA just beyond the origin. Study is nondiagnostic for evaluation of any possible stenosis at the origin. IMA: Study is nondiagnostic for evaluation of patency of the IMA  origin. Renal arteries: Proximal left and right renal arteries maintain flow signal. MRI resolution is below that that would allow quantification of any possible stenosis. Iliac arteries: Flow signal maintained within the bilateral common iliac arteries, hypogastric arteries, external iliac arteries. Mild tortuosity of the bilateral iliac arterial system. No significant atherosclerotic plaque. Venous: Unremarkable systemic venous system. Portal veins:  Flow signal maintained within the main portal vein, right left portal vein, splenic vein. Normal flow signal within the a hepatic veins Nonvascular: Unremarkable signal of the liver and gallbladder. Pancreas: Focal T2 signal within the pancreatic head measuring 8 mm. Series 8 of the abdominal MRI, image 23. Right kidney: Malrotation of the right kidney. Multiple T2 cystic lesions within the right kidney. None of these are completely characterized in the absence of contrast. No hydronephrosis. Left kidney: Asymmetric size the left kidney compared to the right, with the left smaller. Multiple cystic lesions with T2 intensity, the largest on the upper pole measuring 4.9 cm. None of these are completely characterized in the absence of contrast. Large colonic stool burden.  No evidence of bowel obstruction. Rounded mass at the fundus of the uterus measuring 23 mm. The low signal is suggestive of partially calcified fibroid. Fluid signal within the endometrial canal. Musculoskeletal: Relatively uniform marrow signal. IMPRESSION: MR is negative for abdominal aortic aneurysm. Aortic Atherosclerosis (ICD10-I70.0). Large colonic stool burden.  No evidence of obstruction. Cystic lesion in the head of the pancreas measures 8 mm. Differential diagnosis includes pseudocyst as well as cystic neoplasm. Given the size, a follow-up MRI protocol in 1 year is reasonable. Bilateral renal cystic lesions, none of which are completely characterized in the absence of contrast. These may be better assessed by either a follow-up ultrasound or potentially a future MRI, as above. Fluid signal within the endometrial canal. Referral for gynecologic evaluation recommended, as endometrial malignancy is not excluded. Signed, Dulcy Fanny. Dellia Nims, RPVI [facility] Radiology Electronically Signed   By: Corrie Mckusick D.O.   On: 06/30/2020 09:50   MR ANGIO ABDOMEN WO CONTRAST  Result Date:  06/30/2020 CLINICAL DATA:  86 year old female with a fall EXAM: MRA ABDOMEN AND PELVIS WITHOUT CONTRAST TECHNIQUE: Multiplanar, multiecho pulse sequences of the abdomen and pelvis were obtained without intravenous contrast. Angiographic images of abdomen and pelvis were obtained using MRA technique without intravenous contrast. CONTRAST:  None COMPARISON:  No prior CT imaging FINDINGS: MRA abdomen and pelvis Vascular: Aorta: Diameter at the hiatus measures 2.3 cm. Diameter of the juxtarenal aorta measures 1.6 cm. Infrarenal abdominal aorta at the bifurcation measures 13 mm. There is no inflammatory signal in the periaortic region with fat signal maintained. Scattered signal loss within the aortic wall compatible with calcified atherosclerotic plaque. No evidence of dissection. Celiac artery: Flow signal maintained in the proximal celiac artery. Study is nondiagnostic for evaluation of any possible stenosis at the origin. SMA: Flow signal maintained in the SMA just beyond the origin. Study is nondiagnostic for evaluation of any possible stenosis at the origin. IMA: Study is nondiagnostic for evaluation of patency of the IMA origin. Renal arteries: Proximal left and right renal arteries maintain flow signal. MRI resolution is below that that would allow quantification of any possible stenosis. Iliac arteries: Flow signal maintained within the bilateral common iliac arteries, hypogastric arteries, external iliac arteries. Mild tortuosity of the bilateral iliac arterial system. No significant atherosclerotic plaque. Venous: Unremarkable systemic venous system. Portal veins: Flow signal maintained within the main portal vein, right left portal  vein, splenic vein. Normal flow signal within the a hepatic veins Nonvascular: Unremarkable signal of the liver and gallbladder. Pancreas: Focal T2 signal within the pancreatic head measuring 8 mm. Series 8 of the abdominal MRI, image 23. Right kidney: Malrotation of the right  kidney. Multiple T2 cystic lesions within the right kidney. None of these are completely characterized in the absence of contrast. No hydronephrosis. Left kidney: Asymmetric size the left kidney compared to the right, with the left smaller. Multiple cystic lesions with T2 intensity, the largest on the upper pole measuring 4.9 cm. None of these are completely characterized in the absence of contrast. Large colonic stool burden.  No evidence of bowel obstruction. Rounded mass at the fundus of the uterus measuring 23 mm. The low signal is suggestive of partially calcified fibroid. Fluid signal within the endometrial canal. Musculoskeletal: Relatively uniform marrow signal. IMPRESSION: MR is negative for abdominal aortic aneurysm. Aortic Atherosclerosis (ICD10-I70.0). Large colonic stool burden.  No evidence of obstruction. Cystic lesion in the head of the pancreas measures 8 mm. Differential diagnosis includes pseudocyst as well as cystic neoplasm. Given the size, a follow-up MRI protocol in 1 year is reasonable. Bilateral renal cystic lesions, none of which are completely characterized in the absence of contrast. These may be better assessed by either a follow-up ultrasound or potentially a future MRI, as above. Fluid signal within the endometrial canal. Referral for gynecologic evaluation recommended, as endometrial malignancy is not excluded. Signed, Dulcy Fanny. Dellia Nims, RPVI [facility] Radiology Electronically Signed   By: Corrie Mckusick D.O.   On: 06/30/2020 09:50   DG Knee Complete 4 Views Right  Result Date: 06/29/2020 CLINICAL DATA:  Fall, right knee pain EXAM: RIGHT KNEE - COMPLETE 4+ VIEW COMPARISON:  None. FINDINGS: Four view radiograph right knee demonstrates normal alignment. No fracture or dislocation. Joint spaces are preserved. No effusion. Advanced vascular calcifications are seen within the posterior soft tissues. IMPRESSION: No acute fracture or  dislocation. Electronically Signed   By: Fidela Salisbury MD   On: 06/29/2020 21:31   DG Hand Complete Right  Result Date: 06/29/2020 CLINICAL DATA:  Initial evaluation for acute trauma, fall. EXAM: RIGHT HAND - COMPLETE 3+ VIEW COMPARISON:  None. FINDINGS: Bones are severely osteopenic, somewhat limiting assessment for possible subtle acute nondisplaced fractures. No acute fracture or dislocation. Prominent degenerative osteoarthritic changes noted at the first [facility] articulation. Few scattered juxta-articular erosions with overhanging edges noted at the right fourth and fifth MCP articulations, which could reflect changes related underlying gouty arthropathy. No visible soft tissue injury. Atherosclerotic change noted at the wrist. IMPRESSION: 1. No acute osseous abnormality about the right hand. 2. Severe osteopenia. 3. Few scattered juxta-articular erosions with overhanging edges at the right fourth and fifth MCP articulations, which could reflect changes related to underlying gouty arthropathy. Electronically Signed   By: Jeannine Boga M.D.   On: 06/29/2020 21:18        Scheduled Meds: . allopurinol  100 mg Oral Daily  . aspirin EC  81 mg Oral Daily  . budesonide  0.5 mg Nebulization QHS  . escitalopram  20 mg Oral Daily  . hydrALAZINE  25 mg Oral BID  . ipratropium-albuterol  3 mL Nebulization BID  . metoprolol succinate  50 mg Oral Daily  . simvastatin  20 mg Oral QHS   Continuous Infusions:   LOS: 1 day    Time spent: 34 minutes    Sharen Hones, MD [facility]  To contact the attending provider between 7A-7P or the covering provider during after hours 7P-7A, please log into the web site [email or web address] and access using universal [place] password for that web site. If you do not have the password, please call the hospital operator.  07/01/2020, 10:06 AM

## 2020-07-02 DIAGNOSIS — I5032 Chronic diastolic (congestive) heart failure: Secondary | ICD-10-CM

## 2020-07-02 MED ORDER — IPRATROPIUM-ALBUTEROL 0.5-2.5 (3) MG/3ML IN SOLN: 3.0000 mL | Freq: Four times a day (QID) | RESPIRATORY_TRACT | Status: DC | PRN

## 2020-07-02 NOTE — Plan of Care (Signed)
  Problem: Education: Goal: Knowledge of General Education information will improve Description Including pain rating scale, medication(s)/side effects and non-pharmacologic comfort measures Outcome: Progressing   Problem: Health Behavior/Discharge Planning: Goal: Ability to manage health-related needs will improve Outcome: Progressing   

## 2020-07-02 NOTE — Progress Notes (Signed)
PROGRESS NOTE    Marilyn Oconnell  PJK:[id number] DOB: 04/29/1924 DOA: 06/30/2020 PCP: Trinna Post, PA-C   Chief complaint.  Frequent falls Brief Narrative:  Marilyn Oconnell a 86 y.o.femalewith medical history significant oflegally blindness, hypertension, hyperlipidemia, COPD, asthma, gout, depression, CAD,dCHF,chronic back pain, aortic stenosis  CKD stage III, pulmonary fibrosis, atrial fibrillation not on anticoagulants, possible vestibular schwannoma, who presents with fall. 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  She has been having frequent falls at home.   She had echocardiogram recently in July, showed a severe aortic stenosis and moderate pulmonary hypertension. Patient frequent falls most likely due to syncope from severe aortic stenosis.  Mitral valve probably also reached its lifespan.  Patient long-term prognosis very poor. Obtained palliative care consult.     Assessment & Plan:   Principal Problem:   Fall Active Problems:   HLD (hyperlipidemia)   Adjustment disorder with disturbance of emotion   Severe aortic stenosis   Acute renal failure superimposed on stage 3b chronic kidney disease (HCC)   History of gout   Fibrosis of lung (HCC)   Depression   Blindness   Hypertension   Coronary artery disease   COPD (chronic obstructive pulmonary disease) (HCC)   Hypertensive urgency   Elevated troponin   PAF (paroxysmal atrial fibrillation) (HCC)   Chronic diastolic CHF (congestive heart failure) ([place])  #1.  Frequent falls. Most likely due to severe aortic stenosis and syncope.  Patient also is blinded, make her easy to fall.  #2.  Severe aortic stenosis with moderate pulmonary hypertension. No surgical option.  3.  Thoracic aortic aneurysm. Not treated option.  4.  Acute kidney injury on chronic kidney stage IIIb. Renal function had improved.  5.  Chronic diastolic congestive heart failure. Stable.  6.   COPD with pulmonary fibrosis. Stable.  7.  Hypertension urgency. Continue current blood pressure medicine.  8.  Paroxysmal atrial fibrillation. Not currently on anticoagulation.  9.  Elevated troponin. Secondary to atrial fibrillation, aortic stenosis.  Met patient niece in patient room.  Patient herself also participated in discussion. Patient currently has a home, she is blinded.  But her home is set up in no way that the patient can easily navigate.  Patient can still walk.  She also has help at home. Her frequent falls are secondary to heart condition as well as blindness. We explored options for patient to go to at discharge.  Assisted living was option, SNF was also option.  However, patient best interest may be going home. She will continue to have syncope normally where she stays.  However, since she is more familiar with her home environment, she can easily navigate.  That can reduce the possibility of fall from blindness. Going to nursing home or assisted living may cause more falls due to unfamiliar environment.  Or, patient may be too afraid to walk at all.  Then she will be bedbound, which is not the best interest the patient. After discussion, family decide to let the patient go home.  The niece will set up a camera to monitor patient remotely.  She will need to set up the device and prepare home, she can be discharged back home tomorrow.  DVT prophylaxis: Lovenox Code Status: DNR Family Communication: As above.  .   Status is: Inpatient  Remains inpatient appropriate because:Unsafe d/c plan   Dispo: The patient is from: Home  Anticipated d/c is to: Home              Anticipated d/c date is: 1 day              Patient currently is medically stable to d/c.        No intake/output data recorded. No intake/output data recorded.     Consultants:   None  Procedures: None  Antimicrobials:None  Subjective: Patient doing well today.  She has no  complaints of short of breath or cough. He denies any abdominal pain or nausea vomiting.  No diarrhea constipation. No fever or chills. No dysuria hematuria.   Objective: Vitals:   07/01/20 1541 07/01/20 2049 07/01/20 2309 07/02/20 0750  BP: (!) 120/52  (!) 154/68 (!) 161/77  Pulse: 67  69 69  Resp: _0 Temp: 98.6 F (37 C)  98.1 F (36.7 C) 98.5 F (36.9 C)  TempSrc:   Oral   SpO2: 95% 94% 96% 94%  Weight:      Height:       No intake or output data in the 24 hours ending 07/02/20 1044 Filed Weights   06/29/20 1955  Weight: 57.6 kg    Examination:  General exam: Appears calm and comfortable  Respiratory system: Clear to auscultation. Respiratory effort normal. Cardiovascular system: S1 & S2 heard, RRR. 2/6 murmurs,  No pedal edema. Gastrointestinal system: Abdomen is nondistended, soft and nontender. No organomegaly or masses felt. Normal bowel sounds heard. Central nervous system: Alert and oriented x2. No focal neurological deficits. Extremities: Symmetric  Skin: No rashes, lesions or ulcers Psychiatry:  Mood & affect appropriate.     Data Reviewed: I have personally reviewed following labs and imaging studies  CBC: Recent Labs  Lab 06/29/20 1956 07/01/20 0453  WBC 7.4 6.4  HGB 11.5* 10.3*  HCT 34.8* 31.1*  MCV 93.5 92.6  PLT 232 177   Basic Metabolic Panel: Recent Labs  Lab 06/29/20 1956 07/01/20 0453  NA 137 137  K 4.0 3.8  CL 98 103  CO2 26 26  GLUCOSE 165* 97  BUN 49* 38*  CREATININE 1.82* 1.53*  CALCIUM 9.2 9.0   GFR: Estimated Creatinine Clearance: 18.4 mL/min (A) (by C-G formula based on SCr of 1.53 mg/dL (H)). Liver Function Tests: No results for input(s): AST, ALT, ALKPHOS, BILITOT, PROT, ALBUMIN in the last 168 hours. No results for input(s): LIPASE, AMYLASE in the last 168 hours. No results for input(s): AMMONIA in the last 168 hours. Coagulation Profile: No results for input(s): INR, PROTIME in the last 168  hours. Cardiac Enzymes: No results for input(s): CKTOTAL, CKMB, CKMBINDEX, TROPONINI in the last 168 hours. BNP (last 3 results) No results for input(s): PROBNP in the last 8760 hours. HbA1C: Recent Labs    07/01/20 0453  HGBA1C 5.9*   CBG: No results for input(s): GLUCAP in the last 168 hours. Lipid Profile: Recent Labs    07/01/20 0453  CHOL 233*  HDL 55  LDLCALC 167*  TRIG 57  CHOLHDL 4.2   Thyroid Function Tests: No results for input(s): TSH, T4TOTAL, FREET4, T3FREE, THYROIDAB in the last 72 hours. Anemia Panel: No results for input(s): VITAMINB12, FOLATE, FERRITIN, TIBC, IRON, RETICCTPCT in the last 72 hours. Sepsis Labs: No results for input(s): PROCALCITON, LATICACIDVEN in the last 168 hours.  Recent Results (from the past 240 hour(s))  Respiratory Panel by RT PCR (Flu A&B, Covid) - Nasopharyngeal Swab     Status: None   Collection  Time: 06/30/20  7:32 AM   Specimen: Nasopharyngeal Swab  Result Value Ref Range Status   SARS Coronavirus 2 by RT PCR NEGATIVE NEGATIVE Final    Comment: (NOTE) SARS-CoV-2 target nucleic acids are NOT DETECTED.  The SARS-CoV-2 RNA is generally detectable in upper respiratoy specimens during the acute phase of infection. The lowest concentration of SARS-CoV-2 viral copies this assay can detect is 131 copies/mL. A negative result does not preclude SARS-Cov-2 infection and should not be used as the sole basis for treatment or other patient management decisions. A negative result may occur with  improper specimen collection/handling, submission of specimen other than nasopharyngeal swab, presence of viral mutation(s) within the areas targeted by this assay, and inadequate number of viral copies (<131 copies/mL). A negative result must be combined with clinical observations, patient history, and epidemiological information. The expected result is Negative.  Fact Sheet for Patients:  PinkCheek.be  Fact  Sheet for Healthcare Providers:  GravelBags.it  This test is no t yet approved or cleared by the [place] FDA and  has been authorized for detection and/or diagnosis of SARS-CoV-2 by FDA under an Emergency Use Authorization (EUA). This EUA will remain  in effect (meaning this test can be used) for the duration of the COVID-19 declaration under Section 564(b)(1) of the Act, 21 U.S.C. section 360bbb-3(b)(1), unless the authorization is terminated or revoked sooner.     Influenza A by PCR NEGATIVE NEGATIVE Final   Influenza B by PCR NEGATIVE NEGATIVE Final    Comment: (NOTE) The Xpert Xpress SARS-CoV-2/FLU/RSV assay is intended as an aid in  the diagnosis of influenza from Nasopharyngeal swab specimens and  should not be used as a sole basis for treatment. Nasal washings and  aspirates are unacceptable for Xpert Xpress SARS-CoV-2/FLU/RSV  testing.  Fact Sheet for Patients: PinkCheek.be  Fact Sheet for Healthcare Providers: GravelBags.it  This test is not yet approved or cleared by the [place] FDA and  has been authorized for detection and/or diagnosis of SARS-CoV-2 by  FDA under an Emergency Use Authorization (EUA). This EUA will remain  in effect (meaning this test can be used) for the duration of the  Covid-19 declaration under Section 564(b)(1) of the Act, 21  U.S.C. section 360bbb-3(b)(1), unless the authorization is  terminated or revoked. Performed at [facility], [address], [place], [place] [address]          Radiology Studies: No results found.      Scheduled Meds: . allopurinol  100 mg Oral Daily  . aspirin EC  81 mg Oral Daily  . budesonide  0.5 mg Nebulization QHS  . enoxaparin (LOVENOX) injection  30 mg Subcutaneous Q24H  . escitalopram  20 mg Oral Daily  . hydrALAZINE  25 mg Oral BID  . metoprolol succinate  50 mg Oral Daily  . simvastatin   20 mg Oral QHS   Continuous Infusions:   LOS: 2 days    Time spent: 49 minutes    Sharen Hones, MD [facility]   To contact the attending provider between 7A-7P or the covering provider during after hours 7P-7A, please log into the web site [email or web address] and access using universal [place] password for that web site. If you do not have the password, please call the hospital operator.  07/02/2020, 10:44 AM

## 2020-07-03 ENCOUNTER — Telehealth: Payer: Self-pay | Admitting: Physician Assistant

## 2020-07-03 DIAGNOSIS — I4891 Unspecified atrial fibrillation: Secondary | ICD-10-CM

## 2020-07-03 DIAGNOSIS — J449 Chronic obstructive pulmonary disease, unspecified: Secondary | ICD-10-CM

## 2020-07-03 DIAGNOSIS — F3289 Other specified depressive episodes: Secondary | ICD-10-CM

## 2020-07-03 DIAGNOSIS — R54 Age-related physical debility: Secondary | ICD-10-CM

## 2020-07-03 DIAGNOSIS — I160 Hypertensive urgency: Secondary | ICD-10-CM

## 2020-07-03 DIAGNOSIS — H543 Unqualified visual loss, both eyes: Secondary | ICD-10-CM

## 2020-07-03 DIAGNOSIS — I480 Paroxysmal atrial fibrillation: Secondary | ICD-10-CM

## 2020-07-03 NOTE — Progress Notes (Signed)
Occupational Therapy Treatment Patient Details Name: Marilyn Oconnell MRN: [id number] DOB: [birth year] Today's Date: 07/03/2020    History of present illness Pt is a 86yo F admitted to the ED on 06/30/20 for syncopal event that resulted in a fall; pt hit head, R elbow, and R hand. Pt with significant PMH including: HLD, CKD (stage IIIb), lung fibrosis, depression, HTN, CAD, COPD, elevated troponin, diastolic CHF, and A-fib. Imaging significant for right vestibular shwannoma and bil renal cystic lesions.   OT comments  Pt participated in OT treatment session this date. Pt alert, oriented, seated in recliner, reporting fatigue but no pain, and agreeable to tx. Pt engaged in grooming and UB bathing while standing at sink, requiring set up and CGA. No UE support during these tasks. Sit<>stand with SUPV, ambulation with RW and CGA. Provided educ in energy conservation, falls prevention, role of HHOT, and need for greater supervision within home. Pt expressed understanding and agreement, stated she will discuss with her niece ASAP hiring caretakers to spend more time at her home, ideally 7 days/week. Pt making good progress toward goals. She will continue to benefit from skilled OT services while hospitalized, to maximize return to [facility] and minimize risk of future falls. Recommend discharge to home with [facility] and PT and additional caregiver supports.    Follow Up Recommendations  Home health OT    Equipment Recommendations       Recommendations for Other Services      Precautions / Restrictions Precautions Precautions: Fall Restrictions Weight Bearing Restrictions: No       Mobility Bed Mobility                  Transfers Overall transfer level: Needs assistance Equipment used: Rolling walker (2 wheeled) Transfers: Sit to/from Stand;Anterior-Posterior Transfer Sit to Stand: Network engineer transfers: Min guard        Balance Overall balance assessment: Needs  assistance Sitting-balance support: No upper extremity supported;Feet unsupported Sitting balance-Leahy Scale: Good     Standing balance support: During functional activity;Bilateral upper extremity supported Standing balance-Leahy Scale: Good                             ADL either performed or assessed with clinical judgement   ADL Overall ADL's : Needs assistance/impaired     Grooming: Min guard;Set up   Upper Body Bathing: Min guard;Minimal assistance                                   Vision Baseline Vision/History: Legally blind Patient Visual Report: No change from baseline     Perception     Praxis      Cognition Arousal/Alertness: Awake/alert Behavior During Therapy: WFL for tasks assessed/performed;Flat affect Overall Cognitive Status: Within Functional Limits for tasks assessed                                 General Comments: Pt oriented to person, place, basic situation, and time. She is able to follow 1-step VCs consistently during session.        Exercises Other Exercises Other Exercises: Provided educ re: role of OT in acute setting and in [facility], falls preventions, energy conservation strategies   Shoulder Instructions       General Comments  Pertinent Vitals/ Pain       Pain Assessment: No/denies pain  Home Living                                          Prior Functioning/Environment              Frequency  Min 2X/week        Progress Toward Goals  OT Goals(current goals can now be found in the care plan section)  Progress towards OT goals: Progressing toward goals  Acute Rehab OT Goals Patient Stated Goal: to go home OT Goal Formulation: With patient Time For Goal Achievement: 07/14/20 Potential to Achieve Goals: Good  Plan Discharge plan needs to be updated    Co-evaluation                 AM-PAC OT "6 Clicks" Daily Activity     Outcome Measure   Help  from another person eating meals?: A Little Help from another person taking care of personal grooming?: A Little Help from another person toileting, which includes using toliet, bedpan, or urinal?: A Lot Help from another person bathing (including washing, rinsing, drying)?: A Little Help from another person to put on and taking off regular upper body clothing?: A Little Help from another person to put on and taking off regular lower body clothing?: A Lot 6 Click Score: 16    End of Session Equipment Utilized During Treatment: Gait belt;Rolling walker  OT Visit Diagnosis: Other abnormalities of gait and mobility (R26.89);Muscle weakness (generalized) (M62.81)   Activity Tolerance Patient tolerated treatment well   Patient Left in chair;with call bell/phone within reach   Nurse Communication Other (comment) (Home health needs, D/C options, lack of chair alarm box in room)        Time: 7209-4709 OT Time Calculation (min): 24 min  Charges: OT General Charges $OT Visit: 1 Visit OT Treatments $Self Care/Home Management : 23-37 mins  Josiah Lobo, PhD, [place], OTR/L ascom [phone] 07/03/20, 10:03 AM

## 2020-07-03 NOTE — Care Management Important Message (Signed)
Important Message  Patient Details  Name: Marilyn Oconnell MRN: [id number] Date of Birth: May 02, 1924   Medicare Important Message Given:  Yes     Dannette Barbara 07/03/2020, 10:49 AM

## 2020-07-03 NOTE — Telephone Encounter (Signed)
Correction to the below note-  Horris Latino with [facility] is calling back to state that the Patient is planning to on discharging today from [facility]. Wanting to know is Fabio Bering if will be the attending Hospice Provider. And needing confirm that Fabio Bering is in agreement with Palliative care?   Please advise Cb- [phone]

## 2020-07-03 NOTE — Telephone Encounter (Signed)
Marilyn Oconnell as below.

## 2020-07-03 NOTE — Discharge Summary (Signed)
Physician Discharge Summary  Patient ID: Marilyn Oconnell MRN: [id number] DOB/AGE: 1924-09-04 86 y.o.  Admit date: 06/30/2020 Discharge date: 07/03/2020  Admission Diagnoses:  Discharge Diagnoses:  Principal Problem:   Fall Active Problems:   HLD (hyperlipidemia)   Adjustment disorder with disturbance of emotion   Severe aortic stenosis   Acute renal failure superimposed on stage 3b chronic kidney disease (HCC)   History of gout   Fibrosis of lung (HCC)   Depression   Blindness   Hypertension   Coronary artery disease   COPD (chronic obstructive pulmonary disease) (HCC)   Hypertensive urgency   Elevated troponin   PAF (paroxysmal atrial fibrillation) (HCC)   Chronic diastolic CHF (congestive heart failure) [facility])   Discharged Condition: fair  Hospital Course:  Marilyn Oconnell a 86 y.o.femalewith medical history significant oflegally blindness, hypertension, hyperlipidemia, COPD, asthma, gout, depression, CAD,dCHF,chronic back pain, aortic stenosis CKD stage III, pulmonary fibrosis, atrial fibrillation not on anticoagulants, possible vestibular schwannoma, who presents with fall. Per her niece, patient had a bioprosthetic mitral valve replacement 15 years ago.She has been having frequent falls at home.  She had echocardiogram recently in July, showed a severe aortic stenosis and moderate pulmonary hypertension. Patient frequent falls most likely due to syncope from severe aortic stenosis. Mitral valve probably also reached itslifespan. Patient long-term prognosis very poor. Obtained palliative care consult.   #1.  Frequent falls. Most likely due to severe aortic stenosis and syncope.  Patient also is blinded, make her easy to fall. Patient conditions are not treatable. After discussion with patient and family, decision is made to refer patient to hospice as outpatient.  #2.  Severe aortic stenosis with moderate pulmonary hypertension. No surgical option.  3.   Thoracic aortic aneurysm. Not treated option.  4.  Acute kidney injury on chronic kidney stage IIIb. Renal function had improved.  5.  Chronic diastolic congestive heart failure. Stable.  6.  COPD with pulmonary fibrosis. Stable.  7.  Hypertension urgency. Continue current blood pressure medicine.  8.  Paroxysmal atrial fibrillation. Not currently on anticoagulation.  9.  Elevated troponin. Secondary to atrial fibrillation, aortic stenosis    Consults: None  Significant Diagnostic Studies:  MRA CHEST WITH OR WITHOUT CONTRAST  TECHNIQUE: Angiographic images of the chest were obtained using MRA technique without intravenous contrast.  CONTRAST:  None  COMPARISON:  CT chest 06/27/2010  FINDINGS: VASCULAR  Aorta: Artifact at the aortic valve compatible with aortic valve repair. Estimated diameter of the ascending aorta 3.6 cm. Distal thoracic aorta at the hiatus measures 2.3 cm.  No evidence of periaortic fluid or inflammatory change. No evidence of dissection. Mild atherosclerosis as was seen on prior CT.  Heart: Cardiomegaly. No pericardial fluid. Enlargement of the right-sided heart chambers, similar to the comparison CT chest.  Pulmonary Arteries:  Main pulmonary artery measures 2.8 cm.  NON-VASCULAR  Mediastinum: No mediastinal adenopathy. Esophagus is not well evaluated.  Lungs/pleura: No pleural effusion. Signal within the lungs unremarkable.  Musculoskeletal: Artifact at the right glenohumeral joint secondary to prior arthroplasty. Visualized thoracic spine unremarkable on the coronal images.  IMPRESSION: Aortic Atherosclerosis (ICD10-I70.0).  MRA is negative for thoracic aortic aneurysm, with maximum diameter of the ascending aorta estimated 3.6 cm.  Surgical changes of aortic valve repair.  Cardiomegaly.  Signed,  Dulcy Fanny. Dellia Nims, RPVI  [facility]  [facility]  Radiology   Electronically Signed   By: Corrie Mckusick D.O.   On: 06/30/2020 09:29  MRI HEAD WITHOUT  CONTRAST  TECHNIQUE: Multiplanar, multiecho pulse sequences of the brain and surrounding structures were obtained without intravenous contrast.  COMPARISON:  03/25/2020  FINDINGS: Brain: There is no acute infarction or intracranial hemorrhage. There is no parenchymal mass, mass effect, or edema. There is no hydrocephalus or extra-axial fluid collection.  There is a mass at the right cerebellopontine angle extending into the right internal auditory canal. This appears to be similar in size on these large field-of-view images. Prominence of the ventricles and sulci reflects mild generalized parenchymal volume loss. Patchy and confluent areas of T2 hyperintensity in the supratentorial greater than pontine white matter are nonspecific but probably reflect moderate to advanced chronic microvascular ischemic changes.  Vascular: Major vessel flow voids at the skull base are preserved.  Skull and upper cervical spine: Normal marrow signal is preserved.  Sinuses/Orbits: Paranasal sinuses are aerated. Bilateral lens replacements.  Other: Sella is unremarkable.  Mastoid air cells are clear.  IMPRESSION: No evidence of recent infarction or hemorrhage.  Moderate to advanced chronic microvascular ischemic changes.  Similar size of right vestibular schwannoma.   Electronically Signed   By: Macy Mis M.D.   On: 06/30/2020 08:42  MRA ABDOMEN AND PELVIS WITHOUT CONTRAST  TECHNIQUE: Multiplanar, multiecho pulse sequences of the abdomen and pelvis were obtained without intravenous contrast. Angiographic images of abdomen and pelvis were obtained using MRA technique without intravenous contrast.  CONTRAST:  None  COMPARISON:  No prior CT imaging  FINDINGS: MRA abdomen and pelvis  Vascular:  Aorta: Diameter at the hiatus measures 2.3 cm. Diameter  of the juxtarenal aorta measures 1.6 cm. Infrarenal abdominal aorta at the bifurcation measures 13 mm. There is no inflammatory signal in the periaortic region with fat signal maintained. Scattered signal loss within the aortic wall compatible with calcified atherosclerotic plaque. No evidence of dissection.  Celiac artery: Flow signal maintained in the proximal celiac artery. Study is nondiagnostic for evaluation of any possible stenosis at the origin.  SMA: Flow signal maintained in the SMA just beyond the origin. Study is nondiagnostic for evaluation of any possible stenosis at the origin.  IMA: Study is nondiagnostic for evaluation of patency of the IMA origin.  Renal arteries: Proximal left and right renal arteries maintain flow signal. MRI resolution is below that that would allow quantification of any possible stenosis.  Iliac arteries:  Flow signal maintained within the bilateral common iliac arteries, hypogastric arteries, external iliac arteries. Mild tortuosity of the bilateral iliac arterial system. No significant atherosclerotic plaque.  Venous: Unremarkable systemic venous system.  Portal veins: Flow signal maintained within the main portal vein, right left portal vein, splenic vein. Normal flow signal within the a hepatic veins  Nonvascular:  Unremarkable signal of the liver and gallbladder.  Pancreas: Focal T2 signal within the pancreatic head measuring 8 mm. Series 8 of the abdominal MRI, image 23.  Right kidney: Malrotation of the right kidney. Multiple T2 cystic lesions within the right kidney. None of these are completely characterized in the absence of contrast. No hydronephrosis.  Left kidney: Asymmetric size the left kidney compared to the right, with the left smaller. Multiple cystic lesions with T2 intensity, the largest on the upper pole measuring 4.9 cm. None of these are completely characterized in the absence of  contrast.  Large colonic stool burden.  No evidence of bowel obstruction.  Rounded mass at the fundus of the uterus measuring 23 mm. The low signal is suggestive of partially calcified fibroid. Fluid signal within the endometrial canal.  Musculoskeletal: Relatively uniform marrow signal.  IMPRESSION: MR is negative for abdominal aortic aneurysm.  Aortic Atherosclerosis (ICD10-I70.0).  Large colonic stool burden.  No evidence of obstruction.  Cystic lesion in the head of the pancreas measures 8 mm. Differential diagnosis includes pseudocyst as well as cystic neoplasm. Given the size, a follow-up MRI protocol in 1 year is reasonable.  Bilateral renal cystic lesions, none of which are completely characterized in the absence of contrast. These may be better assessed by either a follow-up ultrasound or potentially a future MRI, as above.  Fluid signal within the endometrial canal. Referral for gynecologic evaluation recommended, as endometrial malignancy is not excluded.  Signed,  Dulcy Fanny. Dellia Nims, RPVI  [facility]  [facility] Radiology   Electronically Signed   By: Corrie Mckusick D.O.   On: 06/30/2020 09:50     Treatments: symptomatic management and cardiac monitor  Discharge Exam: Blood pressure (!) 171/54, pulse 63, temperature 98.2 F (36.8 C), temperature source Oral, resp. rate 15, height 5\' 1"  (1.549 m), weight 57.6 kg, SpO2 95 %. General appearance: alert, cooperative and Oriented to person and place. Resp: clear to auscultation bilaterally Cardio: regular 2/6 SM at RUSB GI: soft, non-tender; bowel sounds normal; no masses,  no organomegaly Extremities: extremities normal, atraumatic, no cyanosis or edema  Disposition: Discharge disposition: 50-Hospice/Home       Discharge Instructions    Diet - low sodium heart healthy   Complete by: As directed    Discharge wound care:   Complete by: As  directed    Keep clean, follow with hospice nurse.   Increase activity slowly   Complete by: As directed      Allergies as of 07/03/2020      Reactions   Iodine Anaphylaxis   Shellfish Allergy Anaphylaxis   Azithromycin Hives   Sulfa Antibiotics Hives      Medication List    TAKE these medications   acetaminophen 500 MG tablet Commonly known as: TYLENOL Take 500-1,000 mg by mouth 2 (two) times daily as needed for mild pain, moderate pain, fever or headache.   albuterol (2.5 MG/3ML) 0.083% nebulizer solution Commonly known as: PROVENTIL Take 2.5 mg by nebulization every 8 (eight) hours as needed for wheezing or shortness of breath.   allopurinol 100 MG tablet Commonly known as: ZYLOPRIM Take 100 mg by mouth daily.   budesonide 0.5 MG/2ML nebulizer solution Commonly known as: PULMICORT Take 0.5 mg by nebulization at bedtime.   diclofenac Sodium 1 % Gel Commonly known as: VOLTAREN Apply topically as needed.   escitalopram 20 MG tablet Commonly known as: LEXAPRO TAKE 1 TABLET BY MOUTH  DAILY   Fluticasone-Salmeterol 250-50 MCG/DOSE Aepb Commonly known as: Advair Diskus USE ONE (1) INHALATION BY MOUTH EVERY 12 HOURS. RINSE MOUTH OUT AFTERUSE.   furosemide 20 MG tablet Commonly known as: LASIX TAKE 1 TABLET BY MOUTH  DAILY   hydrALAZINE 10 MG tablet Commonly known as: APRESOLINE Take 1 tablet (10 mg total) by mouth 2 (two) times daily.   ipratropium-albuterol 0.5-2.5 (3) MG/3ML Soln Commonly known as: DUONEB Take 3 mLs by nebulization 3 (three) times daily. What changed: when to take this   losartan 25 MG tablet Commonly known as: Cozaar Take 0.5 tablets (12.5 mg total) by mouth daily.   metoprolol succinate 50 MG 24 hr tablet Commonly known as: TOPROL-XL Take 1.5 tablets (75 mg total) by mouth daily. Take with or immediately following a meal.   simvastatin 20 MG tablet  Commonly known as: ZOCOR TAKE 1 TABLET BY MOUTH AT  BEDTIME   traZODone 50 MG  tablet Commonly known as: DESYREL TAKE ONE-HALF TABLET BY  MOUTH AT BEDTIME            Discharge Care Instructions  (From admission, onward)         Start     Ordered   07/03/20 0000  Discharge wound care:       Comments: Keep clean, follow with hospice nurse.   07/03/20 0840          Follow-up Information    Trinna Post, PA-C Follow up in 1 week(s).   Specialty: Physician Assistant Contact information: [address] [place] [address] [phone]               Signed: Sharen Hones 07/03/2020, 8:40 AM

## 2020-07-03 NOTE — TOC Progression Note (Signed)
Transition of Care [facility]) - Progression Note    Patient Details  Name: LEIGHANN AMADON MRN: [id number] Date of Birth: [birth year]  Transition of Care [facility]) CM/SW Ashley, RN Phone Number: 07/03/2020, 9:11 AM  Clinical Narrative:   Per Dr. Roosevelt Locks, patient's niece has now decided to take patient home with Hospice services. RNCM reached out to [place] patient's niece to discuss. Sharyn Lull reports that she has now decided to take patient home, she reports that she is working on setting up arrangements in the home to assist her. She would like to have [place] involvement. At this time Sharyn Lull does not feel like she needs any additional equipment in the home but does think she may need a hospital bed in the future. Sharyn Lull will pick patient up when ready for discharge.  RNCM reached out to Santiago Glad with [facility] and provided referral.     Expected Discharge Plan: [place] Barriers to Discharge: Continued Medical Work up, ED Psych evaluation (Psych eval to establish capacity.)  Expected Discharge Plan and Services Expected Discharge Plan: [place] In-house Referral: Clinical Social Work Discharge Planning Services: NA Post Acute Care Choice: [place] arrangements for the past 2 months: Single Family Home Expected Discharge Date: 07/03/20               DME Arranged: N/A DME Agency: NA                   Social Determinants of Health (SDOH) Interventions    Readmission Risk Interventions No flowsheet data found.

## 2020-07-03 NOTE — Telephone Encounter (Signed)
Yes, I can be. I am also putting in referral for palliative care.

## 2020-07-03 NOTE — Telephone Encounter (Signed)
Home Health Verb Orders - Caller/Agency: Horris Latino with [facility] Number: [phone]  They want to know if Marilyn Oconnell with continue to follow patient and state an order that she is terminal

## 2020-07-03 NOTE — Telephone Encounter (Signed)
Please review. Thanks!  

## 2020-07-03 NOTE — Telephone Encounter (Signed)
Yes and yes I put the referral for palliative care.

## 2020-07-03 NOTE — Progress Notes (Signed)
Manufacturing engineer hospital liaison note:  New referral for [employer] services at home received from [facility]. Patient information sent to referral. Hospice eligibility has been confirmed  Writer met with patient's niece Sharyn Lull to initiate education regarding hospice services, philosophy and team approach to care with understanding voiced.  No DME needs at discharge. Signed out of facility DNR in place and given to [place]. Hospice contact numbers given to [facility]. Thank you for the opportunity to be involved in the care of this patient and her family.  Flo Shanks BSN, RN, [place] [phone]

## 2020-07-05 ENCOUNTER — Inpatient Hospital Stay: Payer: Medicare Other | Admitting: Physician Assistant

## 2020-07-06 ENCOUNTER — Telehealth: Payer: Self-pay

## 2020-07-06 NOTE — Telephone Encounter (Signed)
Palliative care SW outreached patients niece to schedule in home visit with SW and Therapist, sports. Visit scheduled for Wed 10/20 @230pm .

## 2020-07-12 ENCOUNTER — Other Ambulatory Visit: Payer: Self-pay

## 2020-07-12 ENCOUNTER — Other Ambulatory Visit: Payer: Medicare Other

## 2020-07-12 DIAGNOSIS — Z515 Encounter for palliative care: Secondary | ICD-10-CM

## 2020-07-12 NOTE — Progress Notes (Signed)
COMMUNITY PALLIATIVE CARE SW NOTE  PATIENT NAME: Marilyn Oconnell DOB: [birth year] MRN: [id number]  PRIMARY CARE PROVIDER: Trinna Post, PA-C  RESPONSIBLE PARTY:  Acct ID - Guarantor Home Phone Work Phone Relationship Acct Type  [id number] Cecilio [phone]  Self P/F     [address] [place], [place] [address]     PLAN OF CARE and INTERVENTIONS:             1. GOALS OF CARE/ ADVANCE CARE PLANNING:  Code status to be discussed further a next visit. Patients plans are to transition to an ALF.  2.         SOCIAL/EMOTIONAL/SPIRITUAL ASSESSMENT/ INTERVENTIONS:  SW met with patient and patients niece for initial palliative care visit. Patient resides in a one story home. Patients niece provided update on patients medical status. [facility]F representatives were present upon arrival of visit. ALF conducted an assessment of patient for possible admission into their community. Patient met their criteria and niece is to move forward with admission paperwork. Patient appetite is good. Patient sleeps well. No pain at this time. Patient appeared worried after [facility] assessment, and stated that she did not want to leave her home but knew that it may be what is best. Niece provided background information on patients situation. Patient has private duty caregivers 6 days a week for a few a hours a day, Niece visits once a week. Patients vision is poor, which poses s safety concern for patient to remain living alone. Patient has MD appointment tomorrow to complete FL2 and TB skin test for ALF. SW provided education on palliative care, discussed goals, reviewed care plan, provided emotional support, used active and reflective listening to patient and niece. Palliative care will continue to monitor patient at ALF once transitioned.  3.         PATIENT/CAREGIVER EDUCATION/ COPING:  Patient alert and oriented. Patient is HOH and has poor vision. Patient was able to answer questions  appropriately. Patient appeared worried during visit but denied it and stated that she was just tired. SW will continue to provide emotional support to patient for transition to ALF. Patient does not have children. Patient is a widow. Niece is supportive. 4.         PERSONAL EMERGENCY PLAN:  Family/patient/caregiver will call 9-1-1 for emergencies.  5.         COMMUNITY RESOURCES COORDINATION/ HEALTH CARE NAVIGATION:  Niece manages care and is primary caregiver. 6.          FINANCIAL/LEGAL CONCERNS/INTERVENTIONS:  None.        SOCIAL HX:  Social History   Tobacco Use  . Smoking status: Never Smoker  . Smokeless tobacco: Never Used  Substance Use Topics  . Alcohol use: No    CODE STATUS: Full code ADVANCED DIRECTIVES: Y MOST FORM COMPLETE:  To be discussed HOSPICE EDUCATION PROVIDED:N  PPS: Patient is ambulatory with RW. Patient is able to complete all ADL's I. Niece and caregivers complete meal prep.    Time spent: 45 min    [place], [place]

## 2020-07-13 ENCOUNTER — Ambulatory Visit (INDEPENDENT_AMBULATORY_CARE_PROVIDER_SITE_OTHER): Payer: Medicare Other | Admitting: Physician Assistant

## 2020-07-13 ENCOUNTER — Encounter: Payer: Self-pay | Admitting: Physician Assistant

## 2020-07-13 ENCOUNTER — Other Ambulatory Visit: Payer: Self-pay

## 2020-07-13 ENCOUNTER — Inpatient Hospital Stay: Payer: Medicare Other | Admitting: Physician Assistant

## 2020-07-13 VITALS — BP 155/61 | HR 57 | Resp 20

## 2020-07-13 DIAGNOSIS — Z111 Encounter for screening for respiratory tuberculosis: Secondary | ICD-10-CM

## 2020-07-13 DIAGNOSIS — Z23 Encounter for immunization: Secondary | ICD-10-CM

## 2020-07-13 DIAGNOSIS — W19XXXA Unspecified fall, initial encounter: Secondary | ICD-10-CM | POA: Diagnosis not present

## 2020-07-13 DIAGNOSIS — I480 Paroxysmal atrial fibrillation: Secondary | ICD-10-CM | POA: Diagnosis not present

## 2020-07-13 DIAGNOSIS — J8410 Pulmonary fibrosis, unspecified: Secondary | ICD-10-CM | POA: Diagnosis not present

## 2020-07-13 DIAGNOSIS — K8689 Other specified diseases of pancreas: Secondary | ICD-10-CM | POA: Diagnosis not present

## 2020-07-13 DIAGNOSIS — J449 Chronic obstructive pulmonary disease, unspecified: Secondary | ICD-10-CM | POA: Diagnosis not present

## 2020-07-13 DIAGNOSIS — I350 Nonrheumatic aortic (valve) stenosis: Secondary | ICD-10-CM | POA: Diagnosis not present

## 2020-07-13 DIAGNOSIS — J439 Emphysema, unspecified: Secondary | ICD-10-CM | POA: Diagnosis not present

## 2020-07-13 DIAGNOSIS — J4540 Moderate persistent asthma, uncomplicated: Secondary | ICD-10-CM | POA: Diagnosis not present

## 2020-07-13 DIAGNOSIS — I341 Nonrheumatic mitral (valve) prolapse: Secondary | ICD-10-CM | POA: Diagnosis not present

## 2020-07-13 DIAGNOSIS — I2510 Atherosclerotic heart disease of native coronary artery without angina pectoris: Secondary | ICD-10-CM | POA: Diagnosis not present

## 2020-07-13 DIAGNOSIS — H5316 Psychophysical visual disturbances: Secondary | ICD-10-CM | POA: Diagnosis not present

## 2020-07-13 DIAGNOSIS — I10 Essential (primary) hypertension: Secondary | ICD-10-CM | POA: Diagnosis not present

## 2020-07-13 NOTE — Progress Notes (Addendum)
Established patient visit   Patient: Marilyn Oconnell   DOB: [birth year]   86 y.o. Female  MRN: [id number] Visit Date: 07/13/2020  Today's healthcare provider: Trinna Post, PA-C   Chief Complaint  Patient presents with  . Hospitalization Follow-up   Subjective    HPI  Follow up Hospitalization  Patient was admitted to [facility] on 06/30/2020 and discharged on 07/03/2020. She was treated for syncope and fall. Treatment for this included imaging of head, neck, and shoulder. Imaging did not show an acute stroke. 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  Telephone follow up was done on 07/03/2020 She reports good compliance with treatment. She reports this condition is improved.  Patient presents today with her niece Marilyn Oconnell. She has returned home since the hospitalization. Original plan was to go home with hospice services. However, it turned out that to be admitted to hospice she would have to stop seeing all of her specialist providers including her pulmonologist, cardiologist, and pain management. She is currently being seen by pain management for occipital neuralgia and experiencing relief from her headaches with current treatment and does not wish to stop. She and her niece have discussed placement at [facility] which has a total of six residents and a full time resident assistant. Patient will have ability to see a provider contracted with some type of homecare company, visits likely to be weekly. She needs an FL2 filled out for this. Patient has lived in her current home for 36 years but has come to the realization that it is currently no the optimal living environment.   She had a fall yesterday in her house. She fell in her bedroom, which was out of sight of the cameras that were set up. She does have a life alert that she knows how to use however she figured her home care aid was coming shortly and waited for her arrival on the floor. She  did not hit her heard or lose consciousness. She is not complaining of any pain related to the fall.   She does have some abdominal pain which is improving. It started during her hospitalization. It was in the lower left side but migrated upwards. No nausea, vomiting, fevers, chills. She denies dysuria, constipation. Reports appetite is well. Abdominal MRI did not show acute process, did show 8 mm pancreatic cyst.   Patient is extremely weak and deconditioned. She has difficulty positioning herself frequently enough in bad. Severe aortic stenosis is exacerbating her heart failure. Due to severe weakened state and poor nutritional status she is at risk for bed sores.     Medications: Outpatient Medications Prior to Visit  Medication Sig  . acetaminophen (TYLENOL) 500 MG tablet Take 500-1,000 mg by mouth 2 (two) times daily as needed for mild pain, moderate pain, fever or headache.   . allopurinol (ZYLOPRIM) 100 MG tablet Take 100 mg by mouth daily.   . budesonide (PULMICORT) 0.5 MG/2ML nebulizer solution Take 0.5 mg by nebulization at bedtime.  . diclofenac Sodium (VOLTAREN) 1 % GEL Apply topically as needed.   Marland Kitchen escitalopram (LEXAPRO) 20 MG tablet TAKE 1 TABLET BY MOUTH  DAILY (Patient taking differently: Take 20 mg by mouth daily. )  . Fluticasone-Salmeterol (ADVAIR DISKUS) 250-50 MCG/DOSE AEPB USE ONE (1) INHALATION BY MOUTH EVERY 12 HOURS. RINSE MOUTH OUT AFTERUSE.  . furosemide (LASIX) 20 MG tablet TAKE 1 TABLET BY MOUTH  DAILY (Patient taking differently: Take 20 mg by  mouth daily. )  . hydrALAZINE (APRESOLINE) 10 MG tablet Take 1 tablet (10 mg total) by mouth 2 (two) times daily.  Marland Kitchen ipratropium-albuterol (DUONEB) 0.5-2.5 (3) MG/3ML SOLN Take 3 mLs by nebulization 3 (three) times daily. (Patient taking differently: Take 3 mLs by nebulization in the morning and at bedtime. )  . losartan (COZAAR) 25 MG tablet Take 0.5 tablets (12.5 mg total) by mouth daily.  . metoprolol succinate (TOPROL-XL)  50 MG 24 hr tablet TAKE 1 TABLET BY MOUTH  DAILY WITH OR IMMEDIATELY  FOLLOWING A MEAL  . traZODone (DESYREL) 50 MG tablet TAKE ONE-HALF TABLET BY  MOUTH AT BEDTIME  . albuterol (PROVENTIL) (2.5 MG/3ML) 0.083% nebulizer solution Take 2.5 mg by nebulization every 8 (eight) hours as needed for wheezing or shortness of breath.  (Patient not taking: Reported on 06/30/2020)  . simvastatin (ZOCOR) 20 MG tablet TAKE 1 TABLET BY MOUTH AT  BEDTIME (Patient not taking: Reported on 06/30/2020)   No facility-administered medications prior to visit.    Review of Systems  Constitutional: Negative.   Respiratory: Negative.   Cardiovascular: Negative.   Musculoskeletal: Positive for arthralgias, gait problem, myalgias and neck stiffness.  Psychiatric/Behavioral: The patient is not nervous/anxious.       Objective    BP (!) 155/61   Pulse (!) 57   Resp 20    Physical Exam Constitutional:      General: She is not in acute distress.    Appearance: Normal appearance. She is not ill-appearing.  Cardiovascular:     Rate and Rhythm: Normal rate and regular rhythm.     Heart sounds: Murmur heard.   Pulmonary:     Effort: Pulmonary effort is normal.     Breath sounds: Normal breath sounds.  Abdominal:     General: Bowel sounds are normal.     Palpations: Abdomen is soft.  Skin:    General: Skin is warm and dry.  Neurological:     Mental Status: She is alert and oriented to person, place, and time. Mental status is at baseline.  Psychiatric:        Mood and Affect: Mood normal.        Behavior: Behavior normal.       No results found for any visits on 07/13/20.  Assessment & Plan    1. Severe aortic stenosis  Patient with severe aortic stenosis and history of aortic valve replacement in 2006. She is not a candidate for repeat valve replacement and it was thought her syncope and fall were related to this. Plans for placement at [facility] facility. FL2 completed today and will be  faxed. She will transition to primary care with in home care program at the facility. Order given to Marilyn Oconnell, her niece, for a hospital bed. Expected placement in 1-2 weeks.   I have reconciled her hospital medication list.   2. Fall, initial encounter  See above.  3. Pancreatic mass  Possibly malignant however in context of chronic illness, do not suggest aggressive workup or treatment of this. Patient and niece agree. Abdominal pain seems to be resolving but if this mass is malignant, it may cause pain in the future.   4. Screening-pulmonary TB  - QuantiFERON-TB Gold Plus  5. Need for influenza vaccination  - Flu Vaccine QUAD High Dose(Fluad)     I, Trinna Post, PA-C, have reviewed all documentation for this visit. The documentation on 07/13/20 for the exam, diagnosis, procedures, and orders are all accurate and complete.  The entirety of the information documented in the History of Present Illness, Review of Systems and Physical Exam were personally obtained by me. Portions of this information were initially documented by Wilburt Finlay, CMA and reviewed by me for thoroughness and accuracy.     Paulene Floor  [facility] [phone] (phone) [phone] (fax)  Melissa

## 2020-07-15 LAB — QUANTIFERON-TB GOLD PLUS
QuantiFERON Mitogen Value: >10 IU/mL
QuantiFERON Nil Value: 0.02 IU/mL
QuantiFERON TB1 Ag Value: 0.01 IU/mL
QuantiFERON TB2 Ag Value: 0.01 IU/mL
QuantiFERON-TB Gold Plus: NEGATIVE

## 2020-07-23 ENCOUNTER — Other Ambulatory Visit: Payer: Self-pay | Admitting: Physician Assistant

## 2020-07-23 DIAGNOSIS — I10 Essential (primary) hypertension: Secondary | ICD-10-CM

## 2020-07-23 NOTE — Telephone Encounter (Signed)
Requested Prescriptions  Pending Prescriptions Disp Refills  . furosemide (LASIX) 20 MG tablet [Pharmacy Med Name: Furosemide 20 MG Oral Tablet] 90 tablet 1    Sig: TAKE 1 TABLET BY MOUTH  DAILY     Cardiovascular:  Diuretics - Loop Failed - 07/23/2020  1:18 PM      Failed - Cr in normal range and within 360 days    Creat  Date Value Ref Range Status  07/10/2017 1.15 (H) 0.60 - 0.88 mg/dL Final    Comment:    For patients >84 years of age, the reference limit for Creatinine is approximately 13% higher for people identified as African-American. .    Creatinine, Ser  Date Value Ref Range Status  07/01/2020 1.53 (H) 0.44 - 1.00 mg/dL Final         Failed - Last BP in normal range    BP Readings from Last 1 Encounters:  07/13/20 (!) 155/61         Passed - K in normal range and within 360 days    Potassium  Date Value Ref Range Status  07/01/2020 3.8 3.5 - 5.1 mmol/L Final         Passed - Ca in normal range and within 360 days    Calcium  Date Value Ref Range Status  07/01/2020 9.0 8.9 - 10.3 mg/dL Final         Passed - Na in normal range and within 360 days    Sodium  Date Value Ref Range Status  07/01/2020 137 135 - 145 mmol/L Final  02/26/2019 140 134 - 144 mmol/L Final         Passed - Valid encounter within last 6 months    Recent Outpatient Visits          1 week ago Severe aortic stenosis   [facility], Wendee Beavers, PA-C   1 month ago Essential hypertension   [facility], [place], PA-C   3 months ago Atrial fibrillation with [facility])   [place], [place], [place]   5 months ago Chronic nonintractable headache, unspecified headache type   [place], [place]   12 months ago Syncope, unspecified syncope type   [place], [place]      Future Appointments            In 1 week Gillis Santa, MD [place]

## 2020-07-24 ENCOUNTER — Other Ambulatory Visit: Payer: Self-pay | Admitting: Physician Assistant

## 2020-07-24 DIAGNOSIS — I10 Essential (primary) hypertension: Secondary | ICD-10-CM

## 2020-07-24 MED ORDER — FUROSEMIDE 20 MG PO TABS: 20.0000 mg | ORAL_TABLET | Freq: Every day | ORAL | 1 refills | Status: DC

## 2020-07-24 NOTE — Telephone Encounter (Signed)
PT need a refill  furosemide (LASIX) 20 MG tablet [[id number]]  [place], [place] - [address]  [address] [place] [address]  Phone: [phone] Fax: [phone]

## 2020-07-27 ENCOUNTER — Telehealth: Payer: Self-pay | Admitting: Physician Assistant

## 2020-07-27 DIAGNOSIS — I350 Nonrheumatic aortic (valve) stenosis: Secondary | ICD-10-CM | POA: Diagnosis not present

## 2020-07-27 DIAGNOSIS — H3530 Unspecified macular degeneration: Secondary | ICD-10-CM | POA: Diagnosis not present

## 2020-07-27 DIAGNOSIS — I509 Heart failure, unspecified: Secondary | ICD-10-CM | POA: Diagnosis not present

## 2020-07-27 DIAGNOSIS — M109 Gout, unspecified: Secondary | ICD-10-CM | POA: Diagnosis not present

## 2020-07-27 DIAGNOSIS — J8410 Pulmonary fibrosis, unspecified: Secondary | ICD-10-CM | POA: Diagnosis not present

## 2020-07-27 NOTE — Telephone Encounter (Signed)
[facility] Got an order for a hospital bed but they need additional info to process through insurance / They need a copy of most recent OV notes / need Pts height and weight / Fax# [phone]/ please advise

## 2020-07-31 NOTE — Telephone Encounter (Signed)
Recent office visit and weight/height was faxed to [facility].

## 2020-08-01 ENCOUNTER — Telehealth: Payer: Self-pay | Admitting: Physician Assistant

## 2020-08-01 DIAGNOSIS — D518 Other vitamin B12 deficiency anemias: Secondary | ICD-10-CM | POA: Diagnosis not present

## 2020-08-01 DIAGNOSIS — E7849 Other hyperlipidemia: Secondary | ICD-10-CM | POA: Diagnosis not present

## 2020-08-01 DIAGNOSIS — Z79899 Other long term (current) drug therapy: Secondary | ICD-10-CM | POA: Diagnosis not present

## 2020-08-01 DIAGNOSIS — E038 Other specified hypothyroidism: Secondary | ICD-10-CM | POA: Diagnosis not present

## 2020-08-01 DIAGNOSIS — E559 Vitamin D deficiency, unspecified: Secondary | ICD-10-CM | POA: Diagnosis not present

## 2020-08-01 DIAGNOSIS — E119 Type 2 diabetes mellitus without complications: Secondary | ICD-10-CM | POA: Diagnosis not present

## 2020-08-01 NOTE — Telephone Encounter (Signed)
Copied from [place] [id number]. Topic: Medicare AWV >> Aug 01, 2020 11:23 AM Cher Nakai R wrote: Reason for CRM:  Left message for patient to call back and schedule Medicare Annual Wellness Visit (AWV) either virtually or in office.  Last AWV 01/18/2016  Please schedule at anytime with [facility] Health Advisor.  If any questions, please contact me at [phone]

## 2020-08-02 ENCOUNTER — Telehealth: Payer: Medicare Other | Admitting: Student in an Organized Health Care Education/Training Program

## 2020-08-03 ENCOUNTER — Other Ambulatory Visit: Payer: Medicare Other

## 2020-08-03 ENCOUNTER — Other Ambulatory Visit: Payer: Self-pay

## 2020-08-03 VITALS — BP 140/52 | HR 65 | Resp 22 | Wt 110.0 lb

## 2020-08-03 DIAGNOSIS — Z515 Encounter for palliative care: Secondary | ICD-10-CM

## 2020-08-03 NOTE — Progress Notes (Signed)
PATIENT NAME: PAOLA ALESHIRE DOB: 07-13-1924 MRN: [id number]  PRIMARY CARE PROVIDER: Trinna Post, PA-C  RESPONSIBLE PARTY:  Acct ID - Guarantor Home Phone Work Phone Relationship Acct Type  [id number] Cecilio [phone]  Self P/F     [address] [place], [place] [address]    PLAN OF CARE and INTERVENTIONS:               1.  GOALS OF CARE/ ADVANCE CARE PLANNING:  DNR on file at facility.  Patient desires to remain in her current location and as independent as possible.               2.  PATIENT/CAREGIVER EDUCATION:  CHF symptoms, weights, use of pulmicort and duoneb               3. PERSONAL EMERGENCY PLAN: Facility will activate 911 in the event of an emergency.               4.  DISEASE STATUS:  Arrived at the facility to find patient sitting in her room alone.  Patient is hard of hearing but engaged in conversation.  Patient is now living at [facility] on [address].  She reports settling into the new facility well.  I asked patient if she watches television and she states, "No".  Patient states she mostly just sits in her room.  I asked if she was feeling sad about anything and she responded" there was isn't much to be happy about".  She recalls her husband's passing about 6 years ago and notes the sadness of this loss.  Patient did become tearful when speaking of her husband.  Emotional support and active listening provided.  Spoke with Lattie Haw, [place] for patient.  Medications reviewed.  Pulmicort and duoneb breathing treatments are scheduled at the same time.  Education provided on how to administer breathing treatments.  Discussion completed on patient's history of CHF and importance of obtaining weights.  Lattie Haw states patient was weighed this morning and vital signs were obtained.  Patient is independent with ambulation and uses a cane.  She does have a walker present but states she does not typically use this.  She is independent with tolietting needs  and states she requires supervision with her showers.    Appetite has been fair to good per patient.  She notes sometimes she does not have much of an appetite.   There is a 6 lb weight loss since her last MD encounter last month.    HISTORY OF PRESENT ILLNESS:  86 year old female with history of CHF.  Patient is being followed by Palliative Care monthly and PRN.  CODE STATUS: DNR ADVANCED DIRECTIVES: No MOST FORM: No PPS: 50%   PHYSICAL EXAM:   VITALS: Today's Vitals   08/03/20 1322  BP: (!) 140/52  Pulse: 65  Resp: (!) 22  SpO2: 98%  PainSc: 0-No pain    LUNGS: decreased breath sounds CARDIAC: HRR EXTREMITIES: HRR SKIN: Skin warm and dry to touch.  No skin breakdown reported. NEURO: Alert and oriented some forgetfulness noted.       Lorenza Burton, RN

## 2020-08-04 ENCOUNTER — Telehealth: Payer: Self-pay

## 2020-08-04 NOTE — Telephone Encounter (Signed)
2PM: Palliative care SW outreached PCP office to make aware that per [place] the original order for the semi-electric had been voided due to them not receiving all supportive documentation, SW advised that it appears PCP office did send requested information but order was still canceled. [facility] is requesting a new referral be sent with their supporting documentation signed.   Plan: SW will fax [facility]'s DME forms to PCP office to be signed and sent to [facility] for semi-electric hospital bed.

## 2020-08-04 NOTE — Telephone Encounter (Signed)
Caller name: [place]  Relation to pt: social care with [facility]  Call back number: [phone]    Reason for call:   [facility] closed the referral because they stated they did not receive supporting the supporting documents. Faxing over 2 forms that [facility] is requesting to re open the hospital bed referral, please note when received.

## 2020-08-07 NOTE — Telephone Encounter (Signed)
I didn't see the forms? Looks like this was faxed a week ago, can we refax?

## 2020-08-07 NOTE — Telephone Encounter (Signed)
Forms on your desk.

## 2020-08-08 DIAGNOSIS — Z79899 Other long term (current) drug therapy: Secondary | ICD-10-CM | POA: Diagnosis not present

## 2020-08-08 DIAGNOSIS — E7849 Other hyperlipidemia: Secondary | ICD-10-CM | POA: Diagnosis not present

## 2020-08-08 DIAGNOSIS — D518 Other vitamin B12 deficiency anemias: Secondary | ICD-10-CM | POA: Diagnosis not present

## 2020-08-08 DIAGNOSIS — E119 Type 2 diabetes mellitus without complications: Secondary | ICD-10-CM | POA: Diagnosis not present

## 2020-08-09 ENCOUNTER — Ambulatory Visit
Payer: Medicare Other | Attending: Student in an Organized Health Care Education/Training Program | Admitting: Student in an Organized Health Care Education/Training Program

## 2020-08-09 ENCOUNTER — Other Ambulatory Visit: Payer: Self-pay

## 2020-08-09 ENCOUNTER — Encounter: Payer: Self-pay | Admitting: Student in an Organized Health Care Education/Training Program

## 2020-08-09 VITALS — BP 176/65 | HR 59 | Temp 97.3°F | Resp 16 | Ht 61.0 in | Wt 116.0 lb

## 2020-08-09 DIAGNOSIS — M5481 Occipital neuralgia: Secondary | ICD-10-CM | POA: Diagnosis not present

## 2020-08-09 DIAGNOSIS — G894 Chronic pain syndrome: Secondary | ICD-10-CM | POA: Insufficient documentation

## 2020-08-09 MED ORDER — LIDOCAINE HCL 2 % IJ SOLN
INTRAMUSCULAR | Status: AC
  Filled 2020-08-09: qty 20

## 2020-08-09 MED ORDER — DEXAMETHASONE SODIUM PHOSPHATE 10 MG/ML IJ SOLN
INTRAMUSCULAR | Status: AC
  Filled 2020-08-09: qty 1

## 2020-08-09 MED ORDER — ROPIVACAINE HCL 2 MG/ML IJ SOLN
INTRAMUSCULAR | Status: AC
  Filled 2020-08-09: qty 10

## 2020-08-09 NOTE — Progress Notes (Signed)
Safety precautions to be maintained throughout the outpatient stay will include: orient to surroundings, keep bed in low position, maintain call bell within reach at all times, provide assistance with transfer out of bed and ambulation.  

## 2020-08-09 NOTE — Progress Notes (Signed)
PROVIDER NOTE: Information contained herein reflects review and annotations entered in association with encounter. Interpretation of such information and data should be left to medically-trained personnel. Information provided to patient can be located elsewhere in the medical record under "Patient Instructions". Document created using STT-dictation technology, any transcriptional errors that may result from process are unintentional.    Patient: Marilyn Oconnell  Service Category: Procedure  Provider: Gillis Santa, MD  DOB: [birth year]  DOS: 08/09/2020  Location: [facility]  MRN: [id number]  Setting: Ambulatory - outpatient  Referring Provider: Trinna Post, PA-C  Type: Established Patient  Specialty: Interventional Pain Management  PCP: Trinna Post, PA-C   Primary Reason for Visit: Interventional Pain Management Treatment. CC: Headache  Procedure:          Anesthesia, Analgesia, Anxiolysis:  Type: Therapeutic, Greater, Occipital Nerve Block  #3  Region: Posterolateral Cervical Level: Occipital Ridge   Laterality: Left-Sided  Type: Local Anesthesia  Local Anesthetic: Lidocaine 1-2%  Position: Prone   Indications: 1. Occipital neuralgia of left side   2. Chronic pain syndrome    Pain Score: Pre-procedure: 3 /10 Post-procedure: 0-No pain/10   Pre-op Assessment:  Marilyn Oconnell is a 86 y.o. (year old), female patient, seen today for interventional treatment. She  has a past surgical history that includes Shoulder surgery (09/1999); Sigmoidoscopy (1992); Dilation and curettage of uterus (1988); and Aortic valve replacement (2006). Marilyn Oconnell has a current medication list which includes the following prescription(s): acetaminophen, albuterol, allopurinol, budesonide, diclofenac sodium, escitalopram, fluticasone-salmeterol, furosemide, hydralazine, ipratropium-albuterol, losartan, metoprolol succinate, trazodone, and simvastatin. Her primarily concern today is the  Headache  Initial Vital Signs:  Pulse/HCG Rate: 62  Temp: (!) 97.3 F (36.3 C) Resp: 18 BP: (!) 181/60 SpO2: 98 %  BMI: Estimated body mass index is 21.92 kg/m as calculated from the following:   Height as of this encounter: 5\' 1"  (1.549 m).   Weight as of this encounter: 116 lb (52.6 kg).  Risk Assessment: Allergies: Reviewed. She is allergic to iodine, shellfish allergy, azithromycin, and sulfa antibiotics.  Allergy Precautions: None required Coagulopathies: Reviewed. None identified.  Blood-thinner therapy: None at this time Active Infection(s): Reviewed. None identified. Marilyn Oconnell is afebrile  Site Confirmation: Marilyn Oconnell was asked to confirm the procedure and laterality before marking the site Procedure checklist: Completed Consent: Before the procedure and under the influence of no sedative(s), amnesic(s), or anxiolytics, the patient was informed of the treatment options, risks and possible complications. To fulfill our ethical and legal obligations, as recommended by the American Medical Association's Code of Ethics, I have informed the patient of my clinical impression; the nature and purpose of the treatment or procedure; the risks, benefits, and possible complications of the intervention; the alternatives, including doing nothing; the risk(s) and benefit(s) of the alternative treatment(s) or procedure(s); and the risk(s) and benefit(s) of doing nothing. The patient was provided information about the general risks and possible complications associated with the procedure. These may include, but are not limited to: failure to achieve desired goals, infection, bleeding, organ or nerve damage, allergic reactions, paralysis, and death. In addition, the patient was informed of those risks and complications associated to the procedure, such as failure to decrease pain; infection; bleeding; organ or nerve damage with subsequent damage to sensory, motor, and/or autonomic systems,  resulting in permanent pain, numbness, and/or weakness of one or several areas of the body; allergic reactions; (i.e.: anaphylactic reaction); and/or death. Furthermore, the patient was informed of those risks  and complications associated with the medications. These include, but are not limited to: allergic reactions (i.e.: anaphylactic or anaphylactoid reaction(s)); adrenal axis suppression; blood sugar elevation that in diabetics may result in ketoacidosis or comma; water retention that in patients with history of congestive heart failure may result in shortness of breath, pulmonary edema, and decompensation with resultant heart failure; weight gain; swelling or edema; medication-induced neural toxicity; particulate matter embolism and blood vessel occlusion with resultant organ, and/or nervous system infarction; and/or aseptic necrosis of one or more joints. Finally, the patient was informed that Medicine is not an exact science; therefore, there is also the possibility of unforeseen or unpredictable risks and/or possible complications that may result in a catastrophic outcome. The patient indicated having understood very clearly. We have given the patient no guarantees and we have made no promises. Enough time was given to the patient to ask questions, all of which were answered to the patient's satisfaction. Marilyn Oconnell has indicated that she wanted to continue with the procedure. Attestation: I, the ordering provider, attest that I have discussed with the patient the benefits, risks, side-effects, alternatives, likelihood of achieving goals, and potential problems during recovery for the procedure that I have provided informed consent. Date  Time: 08/09/2020  9:33 AM  Pre-Procedure Preparation:  Monitoring: As per clinic protocol. Respiration, ETCO2, SpO2, BP, heart rate and rhythm monitor placed and checked for adequate function Safety Precautions: Patient was assessed for positional comfort and  pressure points before starting the procedure. Time-out: I initiated and conducted the "Time-out" before starting the procedure, as per protocol. The patient was asked to participate by confirming the accuracy of the "Time Out" information. Verification of the correct person, site, and procedure were performed and confirmed by me, the nursing staff, and the patient. "Time-out" conducted as per Joint Commission's Universal Protocol (UP.01.01.01). Time: 1006  Description of Procedure:          Target Area: Area medial to the occipital artery at the level of the superior nuchal ridge Approach: Posterior approach Area Prepped: Entire Posterior Occipital Region DuraPrep (Iodine Povacrylex [0.7% available iodine] and Isopropyl Alcohol, 74% w/w) Safety Precautions: Aspiration looking for blood return was conducted prior to all injections. At no point did we inject any substances, as a needle was being advanced. No attempts were made at seeking any paresthesias. Safe injection practices and needle disposal techniques used. Medications properly checked for expiration dates. SDV (single dose vial) medications used. Description of the Procedure: Protocol guidelines were followed. The target area was identified and the area prepped in the usual manner. Skin & deeper tissues infiltrated with local anesthetic. Appropriate amount of time allowed to pass for local anesthetics to take effect. The procedure needles were then advanced to the target area. Proper needle placement secured. Negative aspiration confirmed. Solution injected in intermittent fashion, asking for systemic symptoms every 0.5cc of injectate. The needles were then removed and the area cleansed, making sure to leave some of the prepping solution back to take advantage of its long term bactericidal properties.  Vitals:   08/09/20 0933 08/09/20 1000 08/09/20 1010  BP: (!) 181/60 (!) 180/63 (!) 176/65  Pulse: 62 60 (!) 59  Resp: 18 16 16   Temp: (!)  97.3 F (36.3 C)    TempSrc: Temporal    SpO2: 98% 97% 98%  Weight: 116 lb (52.6 kg)    Height: 5\' 1"  (1.549 m)      Start Time: 1006 hrs. End Time: 1008 hrs. Materials:  Needle(s)  Type: Regular needle Gauge: 25G Length: 1.5-in Medication(s): Please see orders for medications and dosing details. 4 cc solution made of 3 cc of 0.2% ropivacaine, 1 cc of decadron, 10 mg/cc.  This was injected for the left greater occipital nerve.  Post-operative Assessment:  Post-procedure Vital Signs:  Pulse/HCG Rate: (!) 59  Temp: (!) 97.3 F (36.3 C) Resp: 16 BP: (!) 176/65 SpO2: 98 %  EBL: None  Complications: No immediate post-treatment complications observed by team, or reported by patient.  Note: The patient tolerated the entire procedure well. A repeat set of vitals were taken after the procedure and the patient was kept under observation following institutional policy, for this type of procedure. Post-procedural neurological assessment was performed, showing return to baseline, prior to discharge. The patient was provided with post-procedure discharge instructions, including a section on how to identify potential problems. Should any problems arise concerning this procedure, the patient was given instructions to immediately contact us, at any time, without hesitation. In any case, we plan to contact the patient by telephone for a follow-up status report regarding this interventional procedure.  Comments:  No additional relevant information.  Plan of Care   Medications ordered for procedure: No orders of the defined types were placed in this encounter.  Medications administered: Marilyn Oconnell had no medications administered during this visit.  See the medical record for exact dosing, route, and time of administration.  Follow-up plan:   Return if symptoms worsen or fail to improve.    Recent Visits Date Type Provider Dept  06/07/20 Procedure visit Gillis Santa, MD [facility]  Showing recent visits within past 90 days and meeting all other requirements Today's Visits Date Type Provider Dept  08/09/20 Procedure visit Gillis Santa, MD [facility]  Showing today's visits and meeting all other requirements Future Appointments No visits were found meeting these conditions. Showing future appointments within next 90 days and meeting all other requirements  Disposition: Discharge home  Discharge (Date  Time): 08/09/2020;   hrs.   Primary Care Physician: Marilyn Oconnell Location: [facility] Note by: Gillis Santa, MD Date: 08/09/2020; Time: 10:23 AM  Disclaimer:  Medicine is not an exact science. The only guarantee in medicine is that nothing is guaranteed. It is important to note that the decision to proceed with this intervention was based on the information collected from the patient. The Data and conclusions were drawn from the patient's questionnaire, the interview, and the physical examination. Because the information was provided in large part by the patient, it cannot be guaranteed that it has not been purposely or unconsciously manipulated. Every effort has been made to obtain as much relevant data as possible for this evaluation. It is important to note that the conclusions that lead to this procedure are derived in large part from the available data. Always take into account that the treatment will also be dependent on availability of resources and existing treatment guidelines, considered by other Pain Management Practitioners as being common knowledge and practice, at the time of the intervention. For Medico-Legal purposes, it is also important to point out that variation in procedural techniques and pharmacological choices are the acceptable norm. The indications, contraindications, technique, and results of the above procedure should only be interpreted and judged by a Board-Certified Interventional Pain  Specialist with extensive familiarity and expertise in the same exact procedure and technique.

## 2020-08-09 NOTE — Patient Instructions (Addendum)
____________________________________________________________________________________________ Patient does not need a follow up. Dr Holley Raring said to call when needed for another injection. She did GREAT today.  Post-Procedure Discharge Instructions  Instructions:  Apply ice:   Purpose: This will minimize any swelling and discomfort after procedure.   When: Day of procedure, as soon as you get home.  How: Fill a plastic sandwich bag with crushed ice. Cover it with a small towel and apply to injection site.  How long: (15 min on, 15 min off) Apply for 15 minutes then remove x 15 minutes.  Repeat sequence on day of procedure, until you go to bed.  Apply heat:   Purpose: To treat any soreness and discomfort from the procedure.  When: Starting the next day after the procedure.  How: Apply heat to procedure site starting the day following the procedure.  How long: May continue to repeat daily, until discomfort goes away.  Food intake: Start with clear liquids (like water) and advance to regular food, as tolerated.   Physical activities: Keep activities to a minimum for the first 8 hours after the procedure. After that, then as tolerated.  Driving: If you have received any sedation, be responsible and do not drive. You are not allowed to drive for 24 hours after having sedation.  Blood thinner: (Applies only to those taking blood thinners) You may restart your blood thinner 6 hours after your procedure.  Insulin: (Applies only to Diabetic patients taking insulin) As soon as you can eat, you may resume your normal dosing schedule.  Infection prevention: Keep procedure site clean and dry. Shower daily and clean area with soap and water.  Post-procedure Pain Diary: Extremely important that this be done correctly and accurately. Recorded information will be used to determine the next step in treatment. For the purpose of accuracy, follow these rules:  Evaluate only the area treated. Do not  report or include pain from an untreated area. For the purpose of this evaluation, ignore all other areas of pain, except for the treated area.  After your procedure, avoid taking a long nap and attempting to complete the pain diary after you wake up. Instead, set your alarm clock to go off every hour, on the hour, for the initial 8 hours after the procedure. Document the duration of the numbing medicine, and the relief you are getting from it.  Do not go to sleep and attempt to complete it later. It will not be accurate. If you received sedation, it is likely that you were given a medication that may cause amnesia. Because of this, completing the diary at a later time may cause the information to be inaccurate. This information is needed to plan your care.  Follow-up appointment: Keep your post-procedure follow-up evaluation appointment after the procedure (usually 2 weeks for most procedures, 6 weeks for radiofrequencies). DO NOT FORGET to bring you pain diary with you.   Expect: (What should I expect to see with my procedure?)  From numbing medicine (AKA: Local Anesthetics): Numbness or decrease in pain. You may also experience some weakness, which if present, could last for the duration of the local anesthetic.  Onset: Full effect within 15 minutes of injected.  Duration: It will depend on the type of local anesthetic used. On the average, 1 to 8 hours.   From steroids (Applies only if steroids were used): Decrease in swelling or inflammation. Once inflammation is improved, relief of the pain will follow.  Onset of benefits: Depends on the amount of swelling present.  The more swelling, the longer it will take for the benefits to be seen. In some cases, up to 10 days.  Duration: Steroids will stay in the system x 2 weeks. Duration of benefits will depend on multiple posibilities including persistent irritating factors.  Side-effects: If present, they may typically last 2 weeks (the duration  of the steroids).  Frequent: Cramps (if they occur, drink Gatorade and take over-the-counter Magnesium 450-500 mg once to twice a day); water retention with temporary weight gain; increases in blood sugar; decreased immune system response; increased appetite.  Occasional: Facial flushing (red, warm cheeks); mood swings; menstrual changes.  Uncommon: Long-term decrease or suppression of natural hormones; bone thinning. (These are more common with higher doses or more frequent use. This is why we prefer that our patients avoid having any injection therapies in other practices.)   Very Rare: Severe mood changes; psychosis; aseptic necrosis.  From procedure: Some discomfort is to be expected once the numbing medicine wears off. This should be minimal if ice and heat are applied as instructed.  Call if: (When should I call?)  You experience numbness and weakness that gets worse with time, as opposed to wearing off.  New onset bowel or bladder incontinence. (Applies only to procedures done in the spine)  Emergency Numbers:  Durning business hours (Monday - Thursday, 8:00 AM - 4:00 PM) (Friday, 9:00 AM - 12:00 Noon): [phone]  After hours: [phone]  NOTE: If you are having a problem and are unable connect with, or to talk to a provider, then go to your nearest urgent care or emergency department. If the problem is serious and urgent, please call 911. ____________________________________________________________________________________________  Occipital Nerve Block Patient Information  Description: The occipital nerves originate in the cervical (neck) spinal cord and travel upward through muscle and tissue to supply sensation to the back of the head and top of the scalp.  In addition, the nerves control some of the muscles of the scalp.  Occipital neuralgia is an irritation of these nerves which can cause headaches, numbness of the scalp, and neck discomfort.     The occipital  nerve block will interrupt nerve transmission through these nerves and can relieve pain and spasm.  The block consists of insertion of a small needle under the skin in the back of the head to deposit local anesthetic (numbing medicine) and/or steroids around the nerve.  The entire block usually lasts less than 5 minutes.  Conditions which may be treated by occipital blocks:   Muscular pain and spasm of the scalp  Nerve irritation, back of the head  Headaches  Upper neck pain  Preparation for the injection:  1. Do not eat any solid food or dairy products within 8 hours of your appointment. 2. You may drink clear liquids up to 3 hours before appointment.  Clear liquids include water, black coffee, juice or soda.  No milk or cream please. 3. You may take your regular medication, including pain medications, with a sip of water before you appointment.  Diabetics should hold regular insulin (if taken separately) and take 1/2 normal NPH dose the morning of the procedure.  Carry some sugar containing items with you to your appointment. 4. A driver must accompany you and be prepared to drive you home after your procedure. 5. Bring all your current medications with you. 6. An IV may be inserted and sedation may be given at the discretion of the physician. 7. A blood pressure cuff, EKG, and other  monitors will often be applied during the procedure.  Some patients may need to have extra oxygen administered for a short period. 8. You will be asked to provide medical information, including your allergies and medications, prior to the procedure.  We must know immediately if you are taking blood thinners (like Coumadin/Warfarin) or if you are allergic to IV iodine contrast (dye).  We must know if you could possible be pregnant.  9. Do not wear a high collared shirt or turtleneck.  Tie long hair up in the back if possible.  Possible side-effects:   Bleeding from needle site  Infection (rare, may require  surgery)  Nerve injury (rare)  Hair on back of neck can be tinged with iodine scrub (this will wash out)  Light-headedness (temporary)  Pain at injection site (several days)  Decreased blood pressure (rare, temporary)  Seizure (very rare)  Call if you experience:   Hives or difficulty breathing ( go to the emergency room)  Inflammation or drainage at the injection site(s)  Please note:  Although the local anesthetic injected can often make your painful muscles or headache feel good for several hours after the injection, the pain may return.  It takes 3-7 days for steroids to work.  You may not notice any pain relief for at least one week.  If effective, we will often do a series of injections spaced 3-6 weeks apart to maximally decrease your pain.  If you have any questions, please call [phone] [facility]

## 2020-08-09 NOTE — Telephone Encounter (Signed)
Called [place] to refax forms.

## 2020-08-10 ENCOUNTER — Telehealth: Payer: Self-pay | Admitting: *Deleted

## 2020-08-10 NOTE — Telephone Encounter (Signed)
Spoke with patient's niece.  Patient is living in assisted living facility.  States she talked with yesterday and was doing well, states she  Is a late sleeper but will talk to her a little later.  Ask that if she is having any issues to please call and we will advise.

## 2020-08-10 NOTE — Progress Notes (Signed)
900AM: Palliative care SW re-faxed DME order for hospital bed and supporting documentation to [place] supply.

## 2020-08-15 DIAGNOSIS — E7849 Other hyperlipidemia: Secondary | ICD-10-CM | POA: Diagnosis not present

## 2020-08-15 DIAGNOSIS — E038 Other specified hypothyroidism: Secondary | ICD-10-CM | POA: Diagnosis not present

## 2020-08-15 DIAGNOSIS — D518 Other vitamin B12 deficiency anemias: Secondary | ICD-10-CM | POA: Diagnosis not present

## 2020-08-15 DIAGNOSIS — Z79899 Other long term (current) drug therapy: Secondary | ICD-10-CM | POA: Diagnosis not present

## 2020-08-15 DIAGNOSIS — E119 Type 2 diabetes mellitus without complications: Secondary | ICD-10-CM | POA: Diagnosis not present

## 2020-08-15 DIAGNOSIS — E559 Vitamin D deficiency, unspecified: Secondary | ICD-10-CM | POA: Diagnosis not present

## 2020-08-16 DIAGNOSIS — J449 Chronic obstructive pulmonary disease, unspecified: Secondary | ICD-10-CM | POA: Diagnosis not present

## 2020-08-16 DIAGNOSIS — I10 Essential (primary) hypertension: Secondary | ICD-10-CM | POA: Diagnosis not present

## 2020-08-16 DIAGNOSIS — I5032 Chronic diastolic (congestive) heart failure: Secondary | ICD-10-CM | POA: Diagnosis not present

## 2020-08-16 DIAGNOSIS — I480 Paroxysmal atrial fibrillation: Secondary | ICD-10-CM | POA: Diagnosis not present

## 2020-08-16 DIAGNOSIS — E782 Mixed hyperlipidemia: Secondary | ICD-10-CM | POA: Diagnosis not present

## 2020-08-16 DIAGNOSIS — I2510 Atherosclerotic heart disease of native coronary artery without angina pectoris: Secondary | ICD-10-CM | POA: Diagnosis not present

## 2020-08-16 DIAGNOSIS — J90 Pleural effusion, not elsewhere classified: Secondary | ICD-10-CM | POA: Diagnosis not present

## 2020-08-16 DIAGNOSIS — Z952 Presence of prosthetic heart valve: Secondary | ICD-10-CM | POA: Diagnosis not present

## 2020-08-16 DIAGNOSIS — I341 Nonrheumatic mitral (valve) prolapse: Secondary | ICD-10-CM | POA: Diagnosis not present

## 2020-08-16 DIAGNOSIS — N179 Acute kidney failure, unspecified: Secondary | ICD-10-CM | POA: Diagnosis not present

## 2020-08-16 DIAGNOSIS — I350 Nonrheumatic aortic (valve) stenosis: Secondary | ICD-10-CM | POA: Diagnosis not present

## 2020-08-16 DIAGNOSIS — R0602 Shortness of breath: Secondary | ICD-10-CM | POA: Diagnosis not present

## 2020-08-16 DIAGNOSIS — N1832 Chronic kidney disease, stage 3b: Secondary | ICD-10-CM | POA: Diagnosis not present

## 2020-08-20 ENCOUNTER — Other Ambulatory Visit: Payer: Self-pay | Admitting: Physician Assistant

## 2020-08-20 DIAGNOSIS — F3289 Other specified depressive episodes: Secondary | ICD-10-CM

## 2020-08-20 NOTE — Telephone Encounter (Signed)
Requested Prescriptions  Pending Prescriptions Disp Refills  . escitalopram (LEXAPRO) 20 MG tablet [Pharmacy Med Name: Escitalopram Oxalate 20 MG Oral Tablet] 90 tablet 1    Sig: TAKE 1 TABLET BY MOUTH  DAILY     Psychiatry:  Antidepressants - SSRI Passed - 08/20/2020  2:24 PM      Passed - Completed PHQ-2 or PHQ-9 in the last 360 days      Passed - Valid encounter within last 6 months    Recent Outpatient Visits          1 month ago Severe aortic stenosis   [facility] [place], Wendee [place], PA-C   2 months ago Essential hypertension   [place], [place], [place]   4 months ago Atrial fibrillation with [facility])   [place], [place], PA-C   6 months ago Chronic nonintractable headache, unspecified headache type   [place], [place]   1 year ago Syncope, unspecified syncope type   [place], Wendee Beavers, [place]

## 2020-08-21 ENCOUNTER — Ambulatory Visit: Payer: Medicare Other | Admitting: Student in an Organized Health Care Education/Training Program

## 2020-08-24 ENCOUNTER — Other Ambulatory Visit: Payer: Self-pay

## 2020-08-24 ENCOUNTER — Encounter: Payer: Self-pay | Admitting: Physician Assistant

## 2020-08-24 ENCOUNTER — Ambulatory Visit (INDEPENDENT_AMBULATORY_CARE_PROVIDER_SITE_OTHER): Payer: Medicare Other | Admitting: Physician Assistant

## 2020-08-24 VITALS — BP 134/53 | HR 64 | Temp 98.0°F

## 2020-08-24 DIAGNOSIS — F3289 Other specified depressive episodes: Secondary | ICD-10-CM | POA: Diagnosis not present

## 2020-08-24 DIAGNOSIS — I10 Essential (primary) hypertension: Secondary | ICD-10-CM

## 2020-08-24 DIAGNOSIS — U071 COVID-19: Secondary | ICD-10-CM | POA: Diagnosis not present

## 2020-08-24 DIAGNOSIS — Z20828 Contact with and (suspected) exposure to other viral communicable diseases: Secondary | ICD-10-CM | POA: Diagnosis not present

## 2020-08-24 DIAGNOSIS — J449 Chronic obstructive pulmonary disease, unspecified: Secondary | ICD-10-CM | POA: Diagnosis not present

## 2020-08-24 DIAGNOSIS — I350 Nonrheumatic aortic (valve) stenosis: Secondary | ICD-10-CM

## 2020-08-24 MED ORDER — FLUTICASONE-SALMETEROL 250-50 MCG/DOSE IN AEPB: INHALATION_SPRAY | RESPIRATORY_TRACT | 1 refills | Status: DC

## 2020-08-24 MED ORDER — LOSARTAN POTASSIUM 25 MG PO TABS: 12.5000 mg | ORAL_TABLET | Freq: Every day | ORAL | 1 refills | Status: DC

## 2020-08-24 MED ORDER — HYDRALAZINE HCL 10 MG PO TABS: 10.0000 mg | ORAL_TABLET | Freq: Two times a day (BID) | ORAL | 0 refills | Status: DC

## 2020-08-24 MED ORDER — ESCITALOPRAM OXALATE 20 MG PO TABS: 20.0000 mg | ORAL_TABLET | Freq: Every day | ORAL | 1 refills | Status: DC

## 2020-08-24 NOTE — Progress Notes (Signed)
Established patient visit   Patient: Marilyn Oconnell   DOB: 07/13/1924   86 y.o. Female  MRN: [id number] Visit Date: 08/24/2020  Today's healthcare provider: Trinna Post, PA-C   Chief Complaint  Patient presents with  . Anxiety  I,Porsha C McClurkin,acting as a scribe for Trinna Post, PA-C.,have documented all relevant documentation on the behalf of Trinna Post, PA-C,as directed by  Trinna Post, PA-C while in the presence of Trinna Post, PA-C.  Subjective    HPI   Patient was recently moved to a group living home, however this did not work out well. Home was isolating towards patient, made mistakes regarding lab mix up between her and another patient, stopped her Lasix, medication errors which patient herself caught.    Anxiety, Follow-up  She was last seen for anxiety 4 months ago. Changes made at last visit include continue current medication.   She reports good compliance with treatment. She reports good tolerance of treatment. She is not having side effects.   She feels her anxiety is mild and Improved since last visit.  Symptoms: No chest pain No difficulty concentrating  No dizziness No fatigue  No feelings of losing control No insomnia  No irritable No palpitations  No panic attacks No racing thoughts  No shortness of breath No sweating  No tremors/shakes    GAD-7 Results No flowsheet data found.  PHQ-9 Scores PHQ9 SCORE ONLY 08/24/2020 08/09/2020 06/07/2020  PHQ-9 Total Score 12 0 0    --------------------------------------------------------------------------------------------------- CHF: Patient followed by cardiology, managed with losartan, metoprolol and Lasix daily.   Anemia: Patient has been chronically anemic. Labs checked by home reveal normal iron and B12.   CBC Latest Ref Rng & Units 07/01/2020 06/29/2020 03/24/2020  WBC 4.0 - 10.5 K/uL 6.4 7.4 11.0(H)  Hemoglobin 12.0 - 15.0 g/dL 10.3(L) 11.5(L) 11.0(L)  Hematocrit 36.0  - 46.0 % 31.1(L) 34.8(L) 31.7(L)  Platelets 150 - 400 K/uL 195 232 208   CKD: CKD III stable.   CMP Latest Ref Rng & Units 07/01/2020 06/29/2020 06/07/2020  Glucose 70 - 99 mg/dL 97 165(H) 103(H)  BUN 8 - 23 mg/dL 38(H) 49(H) 29(H)  Creatinine 0.44 - 1.00 mg/dL 1.53(H) 1.82(H) 1.59(H)  Sodium 135 - 145 mmol/L 137 137 140  Potassium 3.5 - 5.1 mmol/L 3.8 4.0 4.3  Chloride 98 - 111 mmol/L 103 98 100  CO2 22 - 32 mmol/L 26 26 28   Calcium 8.9 - 10.3 mg/dL 9.0 9.2 9.4  Total Protein 6.5 - 8.1 g/dL - - 7.0  Total Bilirubin 0.3 - 1.2 mg/dL - - 0.6  Alkaline Phos 38 - 126 U/L - - 56  AST 15 - 41 U/L - - 17  ALT 0 - 44 U/L - - 10        Medications: Outpatient Medications Prior to Visit  Medication Sig  . acetaminophen (TYLENOL) 500 MG tablet Take 500-1,000 mg by mouth 2 (two) times daily as needed for mild pain, moderate pain, fever or headache.   . albuterol (PROVENTIL) (2.5 MG/3ML) 0.083% nebulizer solution Take 2.5 mg by nebulization every 8 (eight) hours as needed for wheezing or shortness of breath.   . budesonide (PULMICORT) 0.5 MG/2ML nebulizer solution Take 0.5 mg by nebulization at bedtime.  . diclofenac Sodium (VOLTAREN) 1 % GEL Apply topically as needed.   Marland Kitchen escitalopram (LEXAPRO) 20 MG tablet TAKE 1 TABLET BY MOUTH  DAILY  . Fluticasone-Salmeterol (ADVAIR DISKUS) 250-50 MCG/DOSE AEPB USE ONE (  1) INHALATION BY MOUTH EVERY 12 HOURS. RINSE MOUTH OUT AFTERUSE.  . furosemide (LASIX) 20 MG tablet Take 1 tablet (20 mg total) by mouth daily.  Marland Kitchen ipratropium-albuterol (DUONEB) 0.5-2.5 (3) MG/3ML SOLN Take 3 mLs by nebulization 3 (three) times daily. (Patient taking differently: Take 3 mLs by nebulization in the morning and at bedtime. )  . metoprolol succinate (TOPROL-XL) 50 MG 24 hr tablet TAKE 1 TABLET BY MOUTH  DAILY WITH OR IMMEDIATELY  FOLLOWING A MEAL  . allopurinol (ZYLOPRIM) 100 MG tablet Take 100 mg by mouth daily.  (Patient not taking: Reported on 08/24/2020)  . hydrALAZINE  (APRESOLINE) 10 MG tablet Take 1 tablet (10 mg total) by mouth 2 (two) times daily.  Marland Kitchen losartan (COZAAR) 25 MG tablet Take 0.5 tablets (12.5 mg total) by mouth daily.  . simvastatin (ZOCOR) 20 MG tablet TAKE 1 TABLET BY MOUTH AT  BEDTIME (Patient not taking: Reported on 06/30/2020)  . traZODone (DESYREL) 50 MG tablet TAKE ONE-HALF TABLET BY  MOUTH AT BEDTIME (Patient not taking: Reported on 08/24/2020)   No facility-administered medications prior to visit.    Review of Systems  Constitutional: Negative.   Respiratory: Negative.   Cardiovascular: Negative.   Psychiatric/Behavioral: Negative.       Objective    BP (!) 134/53 (BP Location: Right Arm, Patient Position: Sitting, Cuff Size: Large)   Pulse 64   Temp 98 F (36.7 C) (Oral)   SpO2 98%    Physical Exam Constitutional:      Appearance: Normal appearance.  Cardiovascular:     Rate and Rhythm: Normal rate and regular rhythm.     Pulses: Normal pulses.     Heart sounds: Murmur heard.    Pulmonary:     Effort: Pulmonary effort is normal.     Breath sounds: Normal breath sounds.  Skin:    General: Skin is warm and dry.  Neurological:     General: No focal deficit present.     Mental Status: She is alert and oriented to person, place, and time.  Psychiatric:        Mood and Affect: Mood normal.        Behavior: Behavior normal.       No results found for any visits on 08/24/20.  Assessment & Plan    1. Primary hypertension  Continue current medications. New FL2 filled out for [facility].   2. Other depression  Continue.  - escitalopram (LEXAPRO) 20 MG tablet; Take 1 tablet (20 mg total) by mouth daily.  Dispense: 90 tablet; Refill: 1  3. Chronic obstructive pulmonary disease, unspecified COPD type ([place])  Continue advair.  4. Severe aortic stenosis  Not a candidate for intervention.  5. Essential hypertension  Restart Lasix as well.   - hydrALAZINE (APRESOLINE) 10 MG tablet; Take 1 tablet (10 mg  total) by mouth 2 (two) times daily.  Dispense: 180 tablet; Refill: 0 - losartan (COZAAR) 25 MG tablet; Take 0.5 tablets (12.5 mg total) by mouth daily.  Dispense: 45 tablet; Refill: 1   No follow-ups on file.      ITrinna Post, PA-C, have reviewed all documentation for this visit. The documentation on 09/05/20 for the exam, diagnosis, procedures, and orders are all accurate and complete.  The entirety of the information documented in the History of Present Illness, Review of Systems and Physical Exam were personally obtained by me. Portions of this information were initially documented by [facility] and reviewed by me for thoroughness and  accuracy.     Paulene Floor  [facility] [phone] (phone) [phone] (fax)  [place]

## 2020-08-24 NOTE — Patient Instructions (Signed)
Heart Failure and Exercise Heart failure is a condition in which the heart does not fill or pump enough blood and oxygen to support your body and its functions. Heart failure is a long-term (chronic) condition. Living with heart failure can be challenging. However, following your health care provider's instructions about a healthy lifestyle may help improve your symptoms. This includes choosing the right exercise plan. Doing daily physical activity is important after a diagnosis of heart failure. You may have some activity restrictions, so talk to your health care provider before doing any exercises. What are the benefits of exercise? Exercise may:  Make your heart muscles stronger.  Lower your blood pressure.  Lower your cholesterol.  Help you lose weight.  Help your bones stay strong.  Improve your blood circulation.  Help your body use oxygen better. This relieves symptoms such as fatigue and shortness of breath.  Help your mental health by lowering the risk of depression and other problems.  Improve your quality of life.  Decrease your chance of hospital admission for heart failure. What is an exercise plan? An exercise plan is a set of specific exercises and training activities. You will work with your health care provider to create the exercise plan that works for you. The plan may include:  Different types of exercises and how to do them.  Cardiac rehabilitation exercises. These are supervised programs that are designed to strengthen your heart. What are strengthening exercises? Strengthening exercises are a type of physical activity that involves using resistance to improve your muscle strength. Strengthening exercises usually have repetitive motions. These types of exercises can include:  Lifting weights.  Using weight machines.  Using resistance tubes and bands.  Using kettlebells.  Using your body weight, such as doing push-ups or squats. What are balance  exercises? Balance exercises are another type of physical activity. They strengthen the muscles of the back, stomach, and pelvis (core muscles) and improve your balance. They can also lower your risk of falling. These types of exercises can include:  Standing on one leg.  Walking backward, sideways, and in a straight line.  Standing up after sitting, without using your hands.  Shifting your weight from one leg to the other.  Lifting one leg in front of you.  Doing tai chi. This is a type of exercise that uses slow movements and deep breathing. How can I increase my flexibility? Having better flexibility can keep you from falling. It can also lengthen your muscles, improve your range of motion, and help your joints. You can increase your flexibility by:  Doing tai chi.  Doing yoga.  Stretching. How much aerobic exercise should I get?  Aerobic exercises strengthen your breathing and circulation system and increase your body's use of oxygen. Examples of aerobic exercise include biking, walking, running, and swimming. Talk to your health care provider to find out how much aerobic exercise is safe for you.  To do these exercises:  Start exercising slowly, limiting the amount of time at first. You may need to start with 5 minutes of aerobic exercise every day.  Slowly add more minutes until you can safely do at least 30 minutes of exercise at least 4 days a week. Summary  Daily physical activity is important after a diagnosis of heart failure.  Exercise can make your heart muscles stronger. It also offers other benefits that will improve your health.  Talk to your health care provider before doing any exercises. This information is not intended to replace advice  given to you by your health care provider. Make sure you discuss any questions you have with your health care provider. Document Revised: 01/24/2017 Document Reviewed: 01/21/2017 Elsevier Patient Education  [place].

## 2020-08-28 ENCOUNTER — Other Ambulatory Visit: Payer: Self-pay | Admitting: Physician Assistant

## 2020-08-28 DIAGNOSIS — J449 Chronic obstructive pulmonary disease, unspecified: Secondary | ICD-10-CM

## 2020-08-28 MED ORDER — FLUTICASONE-SALMETEROL 250-50 MCG/DOSE IN AEPB: INHALATION_SPRAY | RESPIRATORY_TRACT | 1 refills | Status: DC

## 2020-08-28 NOTE — Telephone Encounter (Signed)
Pt needs refill on her Advair inhaler   Please send to [facility]

## 2020-08-28 NOTE — Telephone Encounter (Signed)
Oren Section, Niece on [place] called and advised Advair was sent to [facility] on 08/24/20. She says they said they did not receive it. I advised it will be resent. She asked if there were any samples in the office. I advised I wasn't sure. Office called and spoke to [place], [facility] who says they don't have Advair samples.

## 2020-09-04 DIAGNOSIS — Z23 Encounter for immunization: Secondary | ICD-10-CM | POA: Diagnosis not present

## 2020-09-12 DIAGNOSIS — I2510 Atherosclerotic heart disease of native coronary artery without angina pectoris: Secondary | ICD-10-CM | POA: Diagnosis not present

## 2020-09-12 DIAGNOSIS — R011 Cardiac murmur, unspecified: Secondary | ICD-10-CM | POA: Diagnosis not present

## 2020-09-18 ENCOUNTER — Telehealth: Payer: Self-pay

## 2020-09-18 NOTE — Telephone Encounter (Signed)
11:00AM: Palliative care SW outreached patient/family for monthly telephonic visit.  SW left HIPPA complaint VM. Awaiting return call.  Will continue to offer palliative care support.

## 2020-10-02 ENCOUNTER — Telehealth: Payer: Self-pay

## 2020-10-02 ENCOUNTER — Ambulatory Visit: Payer: Medicare Other | Admitting: Physician Assistant

## 2020-10-02 ENCOUNTER — Telehealth: Payer: Medicare Other | Admitting: Physician Assistant

## 2020-10-02 NOTE — Telephone Encounter (Signed)
Copied from [place] [phone]. Topic: General - Inquiry >> Sep 29, 2020  5:31 PM Gillis Ends D wrote: Reason for CRM: NP Chrystie Nose called to say that she saw the patient today at  [facility] and she started the patient on claritin today for 7 days. Se can be reached at [phone]. Please advise

## 2020-10-03 ENCOUNTER — Other Ambulatory Visit: Payer: Self-pay

## 2020-10-03 ENCOUNTER — Other Ambulatory Visit: Payer: Commercial Managed Care - HMO

## 2020-10-03 VITALS — BP 142/50 | HR 65 | Temp 97.7°F | Resp 18

## 2020-10-03 DIAGNOSIS — Z515 Encounter for palliative care: Secondary | ICD-10-CM

## 2020-10-03 NOTE — Progress Notes (Signed)
PATIENT NAME: Marilyn Oconnell DOB: 07-17-1924 MRN: [id number]  PRIMARY CARE PROVIDER: Trinna Post, PA-C  RESPONSIBLE PARTY:  Acct ID - Guarantor Home Phone Work Phone Relationship Acct Type  [id number] MILINA, [phone]  Self P/F     [address] [place], [place], [place] [address]    PLAN OF CARE and INTERVENTIONS:               1.  GOALS OF CARE/ ADVANCE CARE PLANNING:  Remain in the ALF with assistance.               2.  PATIENT/CAREGIVER EDUCATION:  Safety               4. PERSONAL EMERGENCY PLAN:  Activate 911 for emergencies.               5.  DISEASE STATUS: Joint visit with [place], SW.  Patient found in her room attempting to make up her bed.  She states she just finished breakfast and wanted to straighten her room.  Encouraged patient to keep her walker close by for safety.    Patient is using a rolling walker for safe ambulation.  She reports no falls since her move to [place].  Patient states she will walk the halls for exercise routinely.  Patient is dressing herself and requires supervision with bathing.  No complaints of shortness of breath.  Patient continues with scheduled breathing treatments.  Patient reports some issues with headaches.  She states she has been getting shots to help with this but is uncertain when this will occur again.  No complaints of headaches today.  Patient is sleeping well throughout the night.  She is napping at times during the day.  Po intake remains good.  Patient is having meals brought to her room at this time.  She has snacks sometimes during the day.  Patient's mood and demeanor have improved significantly since her move from [place].  Patient is very engaging in conversations, smiling and laughing.  She reports feeling much better about being in this facility.  Spoke with med tech this am.  She states patient is doing well overall but has good days and bad days.  Staffs biggest concern is patient's fluid intake  and staying hydrated.   HISTORY OF PRESENT ILLNESS:  87 year old female with hx of COPD and Atrial Fibrillation.  Patient is being followed by Palliative Care monthly and PRN.  CODE STATUS: DNR ADVANCED DIRECTIVES: No MOST FORM: No PPS: 50%   PHYSICAL EXAM:   VITALS:  Temp 97.7 F, BP 142/50 p 65 r 18 O2 sats 97% LUNGS:  Diminished but clear. CARDIAC: HRR EXTREMITIES: no edema SKIN: Warm and dry to touch.  No skin breakdown NEURO: alert and oriented x 3, + for hearing loss       Lorenza Burton, RN

## 2020-10-03 NOTE — Progress Notes (Signed)
COMMUNITY PALLIATIVE CARE SW NOTE  PATIENT NAME: Marilyn Oconnell DOB: 09/11/1924 MRN: [id number]  PRIMARY CARE PROVIDER: [place], Marilyn M, PA-C  RESPONSIBLE PARTY:  Acct ID - Guarantor Home Phone Work Phone Relationship Acct Type  [id number] - Marilyn Oconnell,Marilyn Oconnell* [phone]  Self P/F     [address], [place], [place] [address]     PLAN OF CARE and INTERVENTIONS:             1. GOALS OF CARE/ ADVANCE CARE PLANNING:  Patient is a DNR. Patients plan is to remain at ALF.  2.         SOCIAL/EMOTIONAL/SPIRITUAL ASSESSMENT/ INTERVENTIONS:  SW and RN met with patient for follow up. Patient recently transitioned to [facility] from [facility]. Patient shared that she is much happier at current ALF. Patients appetite is good and she sleeping well with no concerns. No falls reported.  Patient stated that she has had migraines pain on and off recently. She stated that she receives shots in her neck every 6 months or so and may be due for another one soon. RN reviewed medications and took vitals.   SW discussed goals, reviewed care plan, provided emotional support, used active and reflective listening to patient and niece. Palliative care will continue to monitor patient at ALF once transitioned.  3.         PATIENT/CAREGIVER EDUCATION/ COPING:  Patient alert and oriented. Patient is HOH and has poor vision. Patient was able to answer questions appropriately with increased volume. Patients spirits were jovial and euphoric during visit. Patients memory is fading a bit but she is alert and oriented to person, place and time. Niece is supportive. 4.         PERSONAL EMERGENCY PLAN:  follow facility procedures for 9-1-1 for emergencies.  5.         COMMUNITY RESOURCES COORDINATION/ HEALTH CARE NAVIGATION:  Niece manages care and is primary caregiver. 6.          FINANCIAL/LEGAL CONCERNS/INTERVENTIONS:  None.        SOCIAL HX:  Social History   Tobacco Use  . Smoking status: Never Smoker  .  Smokeless tobacco: Never Used  Substance Use Topics  . Alcohol use: No    CODE STATUS: DNR  ADVANCED DIRECTIVES: Y MOST FORM COMPLETE:  N HOSPICE EDUCATION PROVIDED: N  PPS: Patient is independent with ADL's. ALF provides meals. Patient receives MIN A with showers. Patient able to feed self.   Time Spent: 40min       , LCSW 

## 2020-10-18 ENCOUNTER — Encounter: Payer: Self-pay | Admitting: Student in an Organized Health Care Education/Training Program

## 2020-10-18 ENCOUNTER — Other Ambulatory Visit: Payer: Self-pay

## 2020-10-18 ENCOUNTER — Ambulatory Visit
Payer: Medicare Other | Attending: Student in an Organized Health Care Education/Training Program | Admitting: Student in an Organized Health Care Education/Training Program

## 2020-10-18 VITALS — BP 167/55 | HR 67 | Temp 97.2°F | Resp 20 | Ht 62.0 in | Wt 116.0 lb

## 2020-10-18 DIAGNOSIS — M5481 Occipital neuralgia: Secondary | ICD-10-CM | POA: Insufficient documentation

## 2020-10-18 DIAGNOSIS — G894 Chronic pain syndrome: Secondary | ICD-10-CM | POA: Diagnosis present

## 2020-10-18 MED ORDER — DEXAMETHASONE SODIUM PHOSPHATE 10 MG/ML IJ SOLN
10.0000 mg | Freq: Once | INTRAMUSCULAR | Status: AC
  Administered 2020-10-18: 10 mg

## 2020-10-18 MED ORDER — DEXAMETHASONE SODIUM PHOSPHATE 10 MG/ML IJ SOLN
INTRAMUSCULAR | Status: AC
  Filled 2020-10-18: qty 1

## 2020-10-18 NOTE — Progress Notes (Signed)
Safety precautions to be maintained throughout the outpatient stay will include: orient to surroundings, keep bed in low position, maintain call bell within reach at all times, provide assistance with transfer out of bed and ambulation.  

## 2020-10-18 NOTE — Patient Instructions (Signed)
Pain Management Discharge Instructions  General Discharge Instructions :  If you need to reach your doctor call: Monday-Friday 8:00 am - 4:00 pm at [phone] or toll free [phone].  After clinic hours [phone] to have operator reach doctor.  Bring all of your medication bottles to all your appointments in the pain clinic.  To cancel or reschedule your appointment with Pain Management please remember to call 24 hours in advance to avoid a fee.  Refer to the educational materials which you have been given on: General Risks, I had my Procedure. Discharge Instructions, Post Sedation.  Post Procedure Instructions:  The drugs you were given will stay in your system until tomorrow, so for the next 24 hours you should not drive, make any legal decisions or drink any alcoholic beverages.  You may eat anything you prefer, but it is better to start with liquids then soups and crackers, and gradually work up to solid foods.  Please notify your doctor immediately if you have any unusual bleeding, trouble breathing or pain that is not related to your normal pain.  Depending on the type of procedure that was done, some parts of your body may feel week and/or numb.  This usually clears up by tonight or the next day.  Walk with the use of an assistive device or accompanied by an adult for the 24 hours.  You may use ice on the affected area for the first 24 hours.  Put ice in a Ziploc bag and cover with a towel and place against area 15 minutes on 15 minutes off.  You may switch to heat after 24 hours.GENERAL RISKS AND COMPLICATIONS  What are the risk, side effects and possible complications? Generally speaking, most procedures are safe.  However, with any procedure there are risks, side effects, and the possibility of complications.  The risks and complications are dependent upon the sites that are lesioned, or the type of nerve block to be performed.  The closer the procedure is to the spine,  the more serious the risks are.  Great care is taken when placing the radio frequency needles, block needles or lesioning probes, but sometimes complications can occur. 1. Infection: Any time there is an injection through the skin, there is a risk of infection.  This is why sterile conditions are used for these blocks.  There are four possible types of infection. 1. Localized skin infection. 2. Central Nervous System Infection-This can be in the form of Meningitis, which can be deadly. 3. Epidural Infections-This can be in the form of an epidural abscess, which can cause pressure inside of the spine, causing compression of the spinal cord with subsequent paralysis. This would require an emergency surgery to decompress, and there are no guarantees that the patient would recover from the paralysis. 4. Discitis-This is an infection of the intervertebral discs.  It occurs in about 1% of discography procedures.  It is difficult to treat and it may lead to surgery.        2. Pain: the needles have to go through skin and soft tissues, will cause soreness.       3. Damage to internal structures:  The nerves to be lesioned may be near blood vessels or    other nerves which can be potentially damaged.       4. Bleeding: Bleeding is more common if the patient is taking blood thinners such as  aspirin, Coumadin, Ticiid, Plavix, etc., or if he/she have some genetic predisposition  such as   hemophilia. Bleeding into the spinal canal can cause compression of the spinal  cord with subsequent paralysis.  This would require an emergency surgery to  decompress and there are no guarantees that the patient would recover from the  paralysis.       5. Pneumothorax:  Puncturing of a lung is a possibility, every time a needle is introduced in  the area of the chest or upper back.  Pneumothorax refers to free air around the  collapsed lung(s), inside of the thoracic cavity (chest cavity).  Another two possible  complications  related to a similar event would include: Hemothorax and Chylothorax.   These are variations of the Pneumothorax, where instead of air around the collapsed  lung(s), you may have blood or chyle, respectively.       6. Spinal headaches: They may occur with any procedures in the area of the spine.       7. Persistent CSF (Cerebro-Spinal Fluid) leakage: This is a rare problem, but may occur  with prolonged intrathecal or epidural catheters either due to the formation of a fistulous  track or a dural tear.       8. Nerve damage: By working so close to the spinal cord, there is always a possibility of  nerve damage, which could be as serious as a permanent spinal cord injury with  paralysis.       9. Death:  Although rare, severe deadly allergic reactions known as "Anaphylactic  reaction" can occur to any of the medications used.      10. Worsening of the symptoms:  We can always make thing worse.  What are the chances of something like this happening? Chances of any of this occuring are extremely low.  By statistics, you have more of a chance of getting killed in a motor vehicle accident: while driving to the hospital than any of the above occurring .  Nevertheless, you should be aware that they are possibilities.  In general, it is similar to taking a shower.  Everybody knows that you can slip, hit your head and get killed.  Does that mean that you should not shower again?  Nevertheless always keep in mind that statistics do not mean anything if you happen to be on the wrong side of them.  Even if a procedure has a 1 (one) in a 1,000,000 (million) chance of going wrong, it you happen to be that one..Also, keep in mind that by statistics, you have more of a chance of having something go wrong when taking medications.  Who should not have this procedure? If you are on a blood thinning medication (e.g. Coumadin, Plavix, see list of "Blood Thinners"), or if you have an active infection going on, you should not  have the procedure.  If you are taking any blood thinners, please inform your physician.  How should I prepare for this procedure?  Do not eat or drink anything at least six hours prior to the procedure.  Bring a driver with you .  It cannot be a taxi.  Come accompanied by an adult that can drive you back, and that is strong enough to help you if your legs get weak or numb from the local anesthetic.  Take all of your medicines the morning of the procedure with just enough water to swallow them.  If you have diabetes, make sure that you are scheduled to have your procedure done first thing in the morning, whenever possible.  If you have diabetes,   take only half of your insulin dose and notify our nurse that you have done so as soon as you arrive at the clinic.  If you are diabetic, but only take blood sugar pills (oral hypoglycemic), then do not take them on the morning of your procedure.  You may take them after you have had the procedure.  Do not take aspirin or any aspirin-containing medications, at least eleven (11) days prior to the procedure.  They may prolong bleeding.  Wear loose fitting clothing that may be easy to take off and that you would not mind if it got stained with Betadine or blood.  Do not wear any jewelry or perfume  Remove any nail coloring.  It will interfere with some of our monitoring equipment.  NOTE: Remember that this is not meant to be interpreted as a complete list of all possible complications.  Unforeseen problems may occur.  BLOOD THINNERS The following drugs contain aspirin or other products, which can cause increased bleeding during surgery and should not be taken for 2 weeks prior to and 1 week after surgery.  If you should need take something for relief of minor pain, you may take acetaminophen which is found in Tylenol,m Datril, Anacin-3 and Panadol. It is not blood thinner. The products listed below are.  Do not take any of the products listed below  in addition to any listed on your instruction sheet.  A.P.C or A.P.C with Codeine Codeine Phosphate Capsules #3 Ibuprofen Ridaura  ABC compound Congesprin Imuran rimadil  Advil Cope Indocin Robaxisal  Alka-Seltzer Effervescent Pain Reliever and Antacid Coricidin or Coricidin-D  Indomethacin Rufen  Alka-Seltzer plus Cold Medicine Cosprin Ketoprofen S-A-C Tablets  Anacin Analgesic Tablets or Capsules Coumadin Korlgesic Salflex  Anacin Extra Strength Analgesic tablets or capsules CP-2 Tablets Lanoril Salicylate  Anaprox Cuprimine Capsules Levenox Salocol  Anexsia-D Dalteparin Magan Salsalate  Anodynos Darvon compound Magnesium Salicylate Sine-off  Ansaid Dasin Capsules Magsal Sodium Salicylate  Anturane Depen Capsules Marnal Soma  APF Arthritis pain formula Dewitt's Pills Measurin Stanback  Argesic Dia-Gesic Meclofenamic Sulfinpyrazone  Arthritis Bayer Timed Release Aspirin Diclofenac Meclomen Sulindac  Arthritis pain formula Anacin Dicumarol Medipren Supac  Analgesic (Safety coated) Arthralgen Diffunasal Mefanamic Suprofen  Arthritis Strength Bufferin Dihydrocodeine Mepro Compound Suprol  Arthropan liquid Dopirydamole Methcarbomol with Aspirin Synalgos  ASA tablets/Enseals Disalcid Micrainin Tagament  Ascriptin Doan's Midol Talwin  Ascriptin A/D Dolene Mobidin Tanderil  Ascriptin Extra Strength Dolobid Moblgesic Ticlid  Ascriptin with Codeine Doloprin or Doloprin with Codeine Momentum Tolectin  Asperbuf Duoprin Mono-gesic Trendar  Aspergum Duradyne Motrin or Motrin IB Triminicin  Aspirin plain, buffered or enteric coated Durasal Myochrisine Trigesic  Aspirin Suppositories Easprin Nalfon Trillsate  Aspirin with Codeine Ecotrin Regular or Extra Strength Naprosyn Uracel  Atromid-S Efficin Naproxen Ursinus  Auranofin Capsules Elmiron Neocylate Vanquish  Axotal Emagrin Norgesic Verin  Azathioprine Empirin or Empirin with Codeine Normiflo Vitamin E  Azolid Emprazil Nuprin Voltaren  Bayer  Aspirin plain, buffered or children's or timed BC Tablets or powders Encaprin Orgaran Warfarin Sodium  Buff-a-Comp Enoxaparin Orudis Zorpin  Buff-a-Comp with Codeine Equegesic Os-Cal-Gesic   Buffaprin Excedrin plain, buffered or Extra Strength Oxalid   Bufferin Arthritis Strength Feldene Oxphenbutazone   Bufferin plain or Extra Strength Feldene Capsules Oxycodone with Aspirin   Bufferin with Codeine Fenoprofen Fenoprofen Pabalate or Pabalate-SF   Buffets II Flogesic Panagesic   Buffinol plain or Extra Strength Florinal or Florinal with Codeine Panwarfarin   Buf-Tabs Flurbiprofen Penicillamine   Butalbital Compound Four-way cold tablets   Penicillin   Butazolidin Fragmin Pepto-Bismol   Carbenicillin Geminisyn Percodan   Carna Arthritis Reliever Geopen Persantine   Carprofen Gold's salt Persistin   Chloramphenicol Goody's Phenylbutazone   Chloromycetin Haltrain Piroxlcam   Clmetidine heparin Plaquenil   Cllnoril Hyco-pap Ponstel   Clofibrate Hydroxy chloroquine Propoxyphen         Before stopping any of these medications, be sure to consult the physician who ordered them.  Some, such as Coumadin (Warfarin) are ordered to prevent or treat serious conditions such as "deep thrombosis", "pumonary embolisms", and other heart problems.  The amount of time that you may need off of the medication may also vary with the medication and the reason for which you were taking it.  If you are taking any of these medications, please make sure you notify your pain physician before you undergo any procedures.         Occipital Nerve Block Patient Information  Description: The occipital nerves originate in the cervical (neck) spinal cord and travel upward through muscle and tissue to supply sensation to the back of the head and top of the scalp.  In addition, the nerves control some of the muscles of the scalp.  Occipital neuralgia is an irritation of these nerves which can cause headaches, numbness of  the scalp, and neck discomfort.     The occipital nerve block will interrupt nerve transmission through these nerves and can relieve pain and spasm.  The block consists of insertion of a small needle under the skin in the back of the head to deposit local anesthetic (numbing medicine) and/or steroids around the nerve.  The entire block usually lasts less than 5 minutes.  Conditions which may be treated by occipital blocks:   Muscular pain and spasm of the scalp  Nerve irritation, back of the head  Headaches  Upper neck pain  Preparation for the injection:  12. Do not eat any solid food or dairy products within 8 hours of your appointment. 13. You may drink clear liquids up to 3 hours before appointment.  Clear liquids include water, black coffee, juice or soda.  No milk or cream please. 14. You may take your regular medication, including pain medications, with a sip of water before you appointment.  Diabetics should hold regular insulin (if taken separately) and take 1/2 normal NPH dose the morning of the procedure.  Carry some sugar containing items with you to your appointment. 15. A driver must accompany you and be prepared to drive you home after your procedure. 35. Bring all your current medications with you. 17. An IV may be inserted and sedation may be given at the discretion of the physician. 18. A blood pressure cuff, EKG, and other monitors will often be applied during the procedure.  Some patients may need to have extra oxygen administered for a short period. 67. You will be asked to provide medical information, including your allergies and medications, prior to the procedure.  We must know immediately if you are taking blood thinners (like Coumadin/Warfarin) or if you are allergic to IV iodine contrast (dye).  We must know if you could possible be pregnant.  20. Do not wear a high collared shirt or turtleneck.  Tie long hair up in the back if possible.  Possible  side-effects:   Bleeding from needle site  Infection (rare, may require surgery)  Nerve injury (rare)  Hair on back of neck can be tinged with iodine scrub (this will wash out)  Light-headedness (temporary)  Pain at injection site (several days)  Decreased blood pressure (rare, temporary)  Seizure (very rare)  Call if you experience:   Hives or difficulty breathing ( go to the emergency room)  Inflammation or drainage at the injection site(s)  Please note:  Although the local anesthetic injected can often make your painful muscles or headache feel good for several hours after the injection, the pain may return.  It takes 3-7 days for steroids to work.  You may not notice any pain relief for at least one week.  If effective, we will often do a series of injections spaced 3-6 weeks apart to maximally decrease your pain.  If you have any questions, please call [phone] [facility]

## 2020-10-18 NOTE — Progress Notes (Signed)
PROVIDER NOTE: Information contained herein reflects review and annotations entered in association with encounter. Interpretation of such information and data should be left to medically-trained personnel. Information provided to patient can be located elsewhere in the medical record under "Patient Instructions". Document created using STT-dictation technology, any transcriptional errors that may result from process are unintentional.    Patient: Marilyn Oconnell  Service Category: Procedure  Provider: Gillis Santa, MD  DOB: [birth year]  DOS: 10/18/2020  Location: [facility]  MRN: [id number]  Setting: Ambulatory - outpatient  Referring Provider: Trinna Post, PA-C  Type: Established Patient  Specialty: Interventional Pain Management  PCP: Trinna Post, PA-C   Primary Reason for Visit: Interventional Pain Management Treatment. CC: Headache (left)  Procedure:          Anesthesia, Analgesia, Anxiolysis:  Type: Therapeutic, Greater, Occipital Nerve Block  #4  Region: Posterolateral Cervical Level: Occipital Ridge   Laterality: Left-Sided  Type: Local Anesthesia  Local Anesthetic: Lidocaine 1-2%  Position: Prone   Indications: 1. Occipital neuralgia of left side   2. Chronic pain syndrome    Pain Score: Pre-procedure: 3 /10 Post-procedure: 0-No pain/10   Pre-op Assessment:  Ms. Dingledine is a 87 y.o. (year old), female patient, seen today for interventional treatment. She  has a past surgical history that includes Shoulder surgery (09/1999); Sigmoidoscopy (1992); Dilation and curettage of uterus (1988); and Aortic valve replacement (2006). Ms. Stenzel has a current medication list which includes the following prescription(s): acetaminophen, albuterol, budesonide, diclofenac sodium, escitalopram, fluticasone-salmeterol, furosemide, hydralazine, ipratropium-albuterol, losartan, and metoprolol succinate. Her primarily concern today is the Headache (left)  Initial Vital  Signs:  Pulse/HCG Rate: 67ECG Heart Rate: 68 Temp: (!) 97.2 F (36.2 C) Resp: 16 BP: (!) 159/94 SpO2: 100 %  BMI: Estimated body mass index is 21.22 kg/m as calculated from the following:   Height as of this encounter: 5\' 2"  (1.575 m).   Weight as of this encounter: 116 lb (52.6 kg).  Risk Assessment: Allergies: Reviewed. She is allergic to iodine, shellfish allergy, azithromycin, and sulfa antibiotics.  Allergy Precautions: None required Coagulopathies: Reviewed. None identified.  Blood-thinner therapy: None at this time Active Infection(s): Reviewed. None identified. Ms. Maudlin is afebrile  Site Confirmation: Ms. Mayol was asked to confirm the procedure and laterality before marking the site Procedure checklist: Completed Consent: Before the procedure and under the influence of no sedative(s), amnesic(s), or anxiolytics, the patient was informed of the treatment options, risks and possible complications. To fulfill our ethical and legal obligations, as recommended by the American Medical Association's Code of Ethics, I have informed the patient of my clinical impression; the nature and purpose of the treatment or procedure; the risks, benefits, and possible complications of the intervention; the alternatives, including doing nothing; the risk(s) and benefit(s) of the alternative treatment(s) or procedure(s); and the risk(s) and benefit(s) of doing nothing. The patient was provided information about the general risks and possible complications associated with the procedure. These may include, but are not limited to: failure to achieve desired goals, infection, bleeding, organ or nerve damage, allergic reactions, paralysis, and death. In addition, the patient was informed of those risks and complications associated to the procedure, such as failure to decrease pain; infection; bleeding; organ or nerve damage with subsequent damage to sensory, motor, and/or autonomic systems, resulting in  permanent pain, numbness, and/or weakness of one or several areas of the body; allergic reactions; (i.e.: anaphylactic reaction); and/or death. Furthermore, the patient was informed of those  risks and complications associated with the medications. These include, but are not limited to: allergic reactions (i.e.: anaphylactic or anaphylactoid reaction(s)); adrenal axis suppression; blood sugar elevation that in diabetics may result in ketoacidosis or comma; water retention that in patients with history of congestive heart failure may result in shortness of breath, pulmonary edema, and decompensation with resultant heart failure; weight gain; swelling or edema; medication-induced neural toxicity; particulate matter embolism and blood vessel occlusion with resultant organ, and/or nervous system infarction; and/or aseptic necrosis of one or more joints. Finally, the patient was informed that Medicine is not an exact science; therefore, there is also the possibility of unforeseen or unpredictable risks and/or possible complications that may result in a catastrophic outcome. The patient indicated having understood very clearly. We have given the patient no guarantees and we have made no promises. Enough time was given to the patient to ask questions, all of which were answered to the patient's satisfaction. Ms. Betterton has indicated that she wanted to continue with the procedure. Attestation: I, the ordering provider, attest that I have discussed with the patient the benefits, risks, side-effects, alternatives, likelihood of achieving goals, and potential problems during recovery for the procedure that I have provided informed consent. Date  Time: 10/18/2020  1:17 PM  Pre-Procedure Preparation:  Monitoring: As per clinic protocol. Respiration, ETCO2, SpO2, BP, heart rate and rhythm monitor placed and checked for adequate function Safety Precautions: Patient was assessed for positional comfort and pressure points  before starting the procedure. Time-out: I initiated and conducted the "Time-out" before starting the procedure, as per protocol. The patient was asked to participate by confirming the accuracy of the "Time Out" information. Verification of the correct person, site, and procedure were performed and confirmed by me, the nursing staff, and the patient. "Time-out" conducted as per Joint Commission's Universal Protocol (UP.01.01.01). Time: 1346  Description of Procedure:          Target Area: Area medial to the occipital artery at the level of the superior nuchal ridge Approach: Posterior approach Area Prepped: Entire Posterior Occipital Region DuraPrep (Iodine Povacrylex [0.7% available iodine] and Isopropyl Alcohol, 74% w/w) Safety Precautions: Aspiration looking for blood return was conducted prior to all injections. At no point did we inject any substances, as a needle was being advanced. No attempts were made at seeking any paresthesias. Safe injection practices and needle disposal techniques used. Medications properly checked for expiration dates. SDV (single dose vial) medications used. Description of the Procedure: Protocol guidelines were followed. The target area was identified and the area prepped in the usual manner. Skin & deeper tissues infiltrated with local anesthetic. Appropriate amount of time allowed to pass for local anesthetics to take effect. The procedure needles were then advanced to the target area. Proper needle placement secured. Negative aspiration confirmed. Solution injected in intermittent fashion, asking for systemic symptoms every 0.5cc of injectate. The needles were then removed and the area cleansed, making sure to leave some of the prepping solution back to take advantage of its long term bactericidal properties.  Vitals:   10/18/20 1321 10/18/20 1344 10/18/20 1350  BP: (!) 159/94 (!) 148/53 (!) 167/55  Pulse: 67    Resp: 16 18 20   Temp: (!) 97.2 F (36.2 C)     SpO2: 100% 100% 100%  Weight: 116 lb (52.6 kg)    Height: 5\' 2"  (1.575 m)      Start Time: 1346 hrs. End Time: 1348 hrs. Materials:  Needle(s) Type: Regular needle Gauge: 25G  Length: 1.5-in Medication(s): Please see orders for medications and dosing details. 2cc solution made of 1 cc of 0.2% ropivacaine, 1 cc of decadron, 10 mg/cc.  This was injected for the left greater occipital nerve.  Post-operative Assessment:  Post-procedure Vital Signs:  Pulse/HCG Rate: 6768 Temp: (!) 97.2 F (36.2 C) Resp: 20 BP: (!) 167/55 SpO2: 100 %  EBL: None  Complications: No immediate post-treatment complications observed by team, or reported by patient.  Note: The patient tolerated the entire procedure well. A repeat set of vitals were taken after the procedure and the patient was kept under observation following institutional policy, for this type of procedure. Post-procedural neurological assessment was performed, showing return to baseline, prior to discharge. The patient was provided with post-procedure discharge instructions, including a section on how to identify potential problems. Should any problems arise concerning this procedure, the patient was given instructions to immediately contact us, at any time, without hesitation. In any case, we plan to contact the patient by telephone for a follow-up status report regarding this interventional procedure.  Comments:  No additional relevant information.  Plan of Care   Medications ordered for procedure: Meds ordered this encounter  Medications  . dexamethasone (DECADRON) injection 10 mg   Medications administered: We administered dexamethasone.  See the medical record for exact dosing, route, and time of administration.  Follow-up plan:   Return in about 8 weeks (around 12/13/2020) for Post Procedure Evaluation, virtual.    Recent Visits Date Type Provider Dept  08/09/20 Procedure visit Gillis Santa, MD [facility]  Showing  recent visits within past 90 days and meeting all other requirements Today's Visits Date Type Provider Dept  10/18/20 Procedure visit Gillis Santa, MD [facility]  Showing today's visits and meeting all other requirements Future Appointments Date Type Provider Dept  12/13/20 Appointment Gillis Santa, MD [facility]  Showing future appointments within next 90 days and meeting all other requirements  Disposition: Discharge home  Discharge (Date  Time): 10/18/2020; 1400 hrs.   Primary Care Physician: Paulene Floor Location: [facility] Note by: Gillis Santa, MD Date: 10/18/2020; Time: 1:58 PM  Disclaimer:  Medicine is not an exact science. The only guarantee in medicine is that nothing is guaranteed. It is important to note that the decision to proceed with this intervention was based on the information collected from the patient. The Data and conclusions were drawn from the patient's questionnaire, the interview, and the physical examination. Because the information was provided in large part by the patient, it cannot be guaranteed that it has not been purposely or unconsciously manipulated. Every effort has been made to obtain as much relevant data as possible for this evaluation. It is important to note that the conclusions that lead to this procedure are derived in large part from the available data. Always take into account that the treatment will also be dependent on availability of resources and existing treatment guidelines, considered by other Pain Management Practitioners as being common knowledge and practice, at the time of the intervention. For Medico-Legal purposes, it is also important to point out that variation in procedural techniques and pharmacological choices are the acceptable norm. The indications, contraindications, technique, and results of the above procedure should only be interpreted and judged by a  Board-Certified Interventional Pain Specialist with extensive familiarity and expertise in the same exact procedure and technique.

## 2020-10-19 ENCOUNTER — Telehealth: Payer: Self-pay

## 2020-10-19 NOTE — Telephone Encounter (Signed)
Called PP. No answer, no answering machine to leave message.

## 2020-10-23 ENCOUNTER — Telehealth: Payer: Self-pay | Admitting: Physician Assistant

## 2020-10-23 DIAGNOSIS — I10 Essential (primary) hypertension: Secondary | ICD-10-CM

## 2020-10-23 MED ORDER — FUROSEMIDE 20 MG PO TABS: 20.0000 mg | ORAL_TABLET | Freq: Every day | ORAL | 1 refills | Status: DC

## 2020-10-23 NOTE — Telephone Encounter (Signed)
Medication Refill - Medication: furosemide (LASIX) 20 MG tablet     Preferred Pharmacy (with phone number or street name):  [place], [place] - [place] Phone:  [phone]  Fax:  [phone]       Agent: Please be advised that RX refills may take up to 3 business days. We ask that you follow-up with your pharmacy.

## 2020-10-29 ENCOUNTER — Other Ambulatory Visit: Payer: Self-pay | Admitting: Physician Assistant

## 2020-10-29 DIAGNOSIS — I10 Essential (primary) hypertension: Secondary | ICD-10-CM

## 2020-11-28 ENCOUNTER — Other Ambulatory Visit: Payer: Self-pay

## 2020-11-28 ENCOUNTER — Non-Acute Institutional Stay: Payer: Commercial Managed Care - HMO

## 2020-11-28 VITALS — BP 182/72 | HR 64 | Resp 18

## 2020-11-28 DIAGNOSIS — Z515 Encounter for palliative care: Secondary | ICD-10-CM

## 2020-11-28 NOTE — Progress Notes (Signed)
PATIENT NAME: Marilyn Oconnell DOB: 07/15/1924 MRN: [id number]  PRIMARY CARE PROVIDER: Trinna Post, PA-C  RESPONSIBLE PARTY:  Acct ID - Guarantor Home Phone Work Phone Relationship Acct Type  [id number] KALINA, [phone]  Self P/F     [address] [place] [place], [place] [address]    PLAN OF CARE and INTERVENTIONS:               1.  GOALS OF CARE/ ADVANCE CARE PLANNING:  Patient desires to remain at [facility] and independent for as long as possible.                2.  PATIENT/CAREGIVER EDUCATION:  Actively listening provided as patient discussed her losses.  Encouraged patient to ask for assistance with ADL's when feeling tired.               4. PERSONAL EMERGENCY PLAN:  Facility will activate 911 for emergencies.               5.  DISEASE STATUS:  Patient is found in her bed asleep.  She awakens easily to verbal stimulation. Patient states she was feeling tired today and just wanted to sleep in.  She reports normally waking up and getting dressed for the day much earlier than today.  Patient denies any falls over the last month.  She has a rolling walker that she is using when ambulating.  Patient is completing dressing/tolieting independently.  She notes dressing herself is taking a longer time. Patient feels she can call staff for assistance but would prefer to do this on her own for as long as possible.  Patient states her appetite has been good.  Her breakfast is currently on her table and is untouched. Last documented weight on chart from a pharmacy review is 138.6 lbs.   Patient states she is feeling sadness as she has to sell her home in [place] in order to remain in the facility.  She notes she was home over the weekend and most of her items have been sold and now her house will be sold.  Active listening provided as patient discusses her losses which include the loss of her spouse 6 years ago, loss of independence living in her home and now the loss of her home.   Patient states she is happy in the facility and everyone is very nice to her.  I have encouraged her to participate in activities and engage with other residents as this may help provide some comfort to her given her recent losses.  Patient's blood pressure is elevated however she is uncertain if she took her am medications.   Medications reviewed from facility chart.  Budesonide nebulizer was discontinued on 11/24/20.  Checked with facility staff and patient has taken her am medications.  Her blood pressure is rechecked after 30 minutes and is 168/72.     HISTORY OF PRESENT ILLNESS:  87 year old female with a Hx of HTN and Atrial Fibrillation.  Patient is being followed by Palliative Care monthly and PRN.  CODE STATUS: DNR  ADVANCED DIRECTIVES: No MOST FORM: No PPS: 50%   PHYSICAL EXAM:   VITALS: Today's Vitals   11/28/20 0916  BP: (!) 182/72  Pulse: 64  Resp: 18  SpO2: 94%  PainSc: 0-No pain    LUNGS: CTA -for shortness of breath. CARDIAC: regular EXTREMITIES: -for edema SKIN: warm and dry to touch. -for skin breakdown. +for dryness.  NEURO: alert and oriented x 3.  Some forgetfulness is present.       Lorenza Burton, RN

## 2020-12-12 NOTE — Progress Notes (Signed)
Selinda Eon, niece will be on the phone call for post procedure evaluation.

## 2020-12-13 ENCOUNTER — Other Ambulatory Visit: Payer: Self-pay

## 2020-12-13 ENCOUNTER — Ambulatory Visit
Payer: Medicare Other | Attending: Student in an Organized Health Care Education/Training Program | Admitting: Student in an Organized Health Care Education/Training Program

## 2020-12-13 ENCOUNTER — Encounter: Payer: Self-pay | Admitting: Student in an Organized Health Care Education/Training Program

## 2020-12-13 DIAGNOSIS — S42215S Unspecified nondisplaced fracture of surgical neck of left humerus, sequela: Secondary | ICD-10-CM

## 2020-12-13 DIAGNOSIS — M5481 Occipital neuralgia: Secondary | ICD-10-CM

## 2020-12-13 DIAGNOSIS — R519 Headache, unspecified: Secondary | ICD-10-CM

## 2020-12-13 DIAGNOSIS — G894 Chronic pain syndrome: Secondary | ICD-10-CM | POA: Diagnosis not present

## 2020-12-13 DIAGNOSIS — G8929 Other chronic pain: Secondary | ICD-10-CM

## 2020-12-13 NOTE — Progress Notes (Signed)
Patient: Marilyn Oconnell  Service Category: E/M  Provider: Gillis Santa, MD  DOB: 11-29-1923  DOS: 12/13/2020  Location: Office  MRN: [id number]  Setting: Ambulatory outpatient  Referring Provider: Trinna Post, PA-C  Type: Established Patient  Specialty: Interventional Pain Management  PCP: Trinna Post, PA-C  Location: Home  Delivery: TeleHealth     Virtual Encounter - Pain Management PROVIDER NOTE: Information contained herein reflects review and annotations entered in association with encounter. Interpretation of such information and data should be left to medically-trained personnel. Information provided to patient can be located elsewhere in the medical record under "Patient Instructions". Document created using STT-dictation technology, any transcriptional errors that may result from process are unintentional.    Contact & Pharmacy Preferred: [phone] Home: [phone] (home) Mobile: [phone] (mobile) E-mail: No e-mail address on record  [place], [place] - [address] [place] [address] Phone: [phone] Fax: [phone]  [place], [place], [address] [place], [place] [address] [place] [address] Phone: [phone] Fax: [phone]  [place], [place] - [place] [address] Phone: [phone] Fax: [phone]   Pre-screening  Marilyn Oconnell offered "in-person" vs "virtual" encounter. She indicated preferring virtual for this encounter.   Reason COVID-19*  Social distancing based on CDC and AMA recommendations.   I contacted Marilyn Oconnell on 12/13/2020 via video conference.      I clearly identified myself as Gillis Santa, MD. I verified that I was speaking with the correct person using two identifiers (Name: Marilyn Oconnell, and date of birth: 09-Nov-1923).  Consent I sought verbal advanced consent from  Marilyn Oconnell for virtual visit interactions. I informed Marilyn Oconnell of possible security and privacy concerns, risks, and limitations associated with providing "not-in-person" medical evaluation and management services. I also informed Marilyn Oconnell of the availability of "in-person" appointments. Finally, I informed her that there would be a charge for the virtual visit and that she could be  personally, fully or partially, financially responsible for it. Marilyn Oconnell expressed understanding and agreed to proceed.   Historic Elements   Marilyn Oconnell is a 87 y.o. year old, female patient evaluated today after our last contact on 10/18/2020. Marilyn Oconnell  has a past medical history of Arthritis, CHF (congestive heart failure) ([place]), COPD (chronic obstructive pulmonary disease) ([place]), Coronary artery disease, Hypercholesteremia, and Hypertension. She also  has a past surgical history that includes Shoulder surgery (09/1999); Sigmoidoscopy (1992); Dilation and curettage of uterus (1988); and Aortic valve replacement (2006). Marilyn Oconnell has a current medication list which includes the following prescription(s): acetaminophen, albuterol, budesonide, diclofenac sodium, escitalopram, fluticasone-salmeterol, furosemide, ipratropium-albuterol, metoprolol succinate, hydralazine, and losartan. She  reports that she has never smoked. She has never used smokeless tobacco. She reports that she does not drink alcohol and does not use drugs. Marilyn Oconnell is allergic to iodine, shellfish allergy, azithromycin, and sulfa antibiotics.   HPI  Today, she is being contacted for a post-procedure assessment.  Post-Procedure Evaluation  Procedure (10/18/2020):  Type: Therapeutic, Greater, Occipital Nerve Block  #4  Region: Posterolateral Cervical Level: Occipital Ridge   Laterality: Left-Sided  Sedation: Please see nurses note.  Effectiveness during initial hour after procedure(Ultra-Short Term Relief): 100 %   Local anesthetic  used: Long-acting (4-6 hours) Effectiveness: Defined as any analgesic benefit obtained secondary to the administration of local anesthetics.  This carries significant diagnostic value as to the etiological location, or anatomical origin, of the pain. Duration of benefit is expected to coincide with the duration of the local anesthetic used.  Effectiveness during initial 4-6 hours after procedure(Short-Term Relief): 100 %   Long-term benefit: Defined as any relief past the pharmacologic duration of the local anesthetics.  Effectiveness past the initial 6 hours after procedure(Long-Term Relief): 50 %.   Current benefits: Defined as benefit that persist at this time.   Analgesia:  <50% better  Sharyn Lull who is Marilyn Oconnell's niece and the primary caregiver states that Marilyn Oconnell notices a significant reduction in her occipital dominant headaches, improved functional status, greater interaction with loved ones after her occipital nerve blocks.  They provide approximately 6 to 7 weeks of pretty significant pain relief.  Marilyn Oconnell has noted a return of her headaches that are affecting her quality of life.  She would like to come in for a repeat left-sided greater occipital nerve block.  Laboratory Chemistry Profile   Renal Lab Results  Component Value Date   BUN 38 (H) 07/01/2020   CREATININE 1.53 (H) 07/01/2020   BCR 17 02/26/2019   GFRAA 32 (L) 06/07/2020   GFRNONAA 29 (L) 07/01/2020     Hepatic Lab Results  Component Value Date   AST 17 06/07/2020   ALT 10 06/07/2020   ALBUMIN 3.9 06/07/2020   ALKPHOS 56 06/07/2020   LIPASE 25 11/27/2015     Electrolytes Lab Results  Component Value Date   NA 137 07/01/2020   K 3.8 07/01/2020   CL 103 07/01/2020   CALCIUM 9.0 07/01/2020   MG 1.9 03/22/2020   PHOS 3.5 12/26/2017     Bone No results found for: VD25OH, VD125OH2TOT, OZ3664QI3, KV4259DG3, 25OHVITD1, 25OHVITD2, 25OHVITD3, TESTOFREE, TESTOSTERONE   Inflammation (CRP: Acute Phase)  (ESR: Chronic Phase) No results found for: CRP, ESRSEDRATE, LATICACIDVEN     Note: Above Lab results reviewed.  Imaging  MR ANGIO PELVIS WO CONTRAST CLINICAL DATA:  87 year old female with a fall  EXAM: MRA ABDOMEN AND PELVIS WITHOUT CONTRAST  TECHNIQUE: Multiplanar, multiecho pulse sequences of the abdomen and pelvis were obtained without intravenous contrast. Angiographic images of abdomen and pelvis were obtained using MRA technique without intravenous contrast.  CONTRAST:  None  COMPARISON:  No prior CT imaging  FINDINGS: MRA abdomen and pelvis  Vascular:  Aorta: Diameter at the hiatus measures 2.3 cm. Diameter of the juxtarenal aorta measures 1.6 cm. Infrarenal abdominal aorta at the bifurcation measures 13 mm. There is no inflammatory signal in the periaortic region with fat signal maintained. Scattered signal loss within the aortic wall compatible with calcified atherosclerotic plaque. No evidence of dissection.  Celiac artery: Flow signal maintained in the proximal celiac artery. Study is nondiagnostic for evaluation of any possible stenosis at the origin.  SMA: Flow signal maintained in the SMA just beyond the origin. Study is nondiagnostic for evaluation of any possible stenosis at the origin.  IMA: Study is nondiagnostic for evaluation of patency of the IMA origin.  Renal arteries: Proximal left and right renal arteries maintain flow signal. MRI resolution is below that that would allow quantification of any possible stenosis.  Iliac arteries:  Flow signal maintained within the bilateral common iliac arteries, hypogastric arteries, external iliac arteries. Mild tortuosity of the bilateral iliac arterial system. No significant atherosclerotic plaque.  Venous: Unremarkable systemic venous system.  Portal veins: Flow signal maintained within the main portal vein, right left portal vein, splenic vein. Normal  flow signal within the a hepatic  veins  Nonvascular:  Unremarkable signal of the liver and gallbladder.  Pancreas: Focal T2 signal within the pancreatic head measuring 8 mm. Series 8 of the abdominal MRI, image 23.  Right kidney: Malrotation of the right kidney. Multiple T2 cystic lesions within the right kidney. None of these are completely characterized in the absence of contrast. No hydronephrosis.  Left kidney: Asymmetric size the left kidney compared to the right, with the left smaller. Multiple cystic lesions with T2 intensity, the largest on the upper pole measuring 4.9 cm. None of these are completely characterized in the absence of contrast.  Large colonic stool burden.  No evidence of bowel obstruction.  Rounded mass at the fundus of the uterus measuring 23 mm. The low signal is suggestive of partially calcified fibroid. Fluid signal within the endometrial canal.  Musculoskeletal: Relatively uniform marrow signal.  IMPRESSION: MR is negative for abdominal aortic aneurysm.  Aortic Atherosclerosis (ICD10-I70.0).  Large colonic stool burden.  No evidence of obstruction.  Cystic lesion in the head of the pancreas measures 8 mm. Differential diagnosis includes pseudocyst as well as cystic neoplasm. Given the size, a follow-up MRI protocol in 1 year is reasonable.  Bilateral renal cystic lesions, none of which are completely characterized in the absence of contrast. These may be better assessed by either a follow-up ultrasound or potentially a future MRI, as above.  Fluid signal within the endometrial canal. Referral for gynecologic evaluation recommended, as endometrial malignancy is not excluded.  Signed,  Dulcy Fanny. Dellia Nims, RPVI  [facility]  [facility]  Electronically Signed   By: Corrie Mckusick D.O.   On: 06/30/2020 09:50 MR ANGIO ABDOMEN WO CONTRAST CLINICAL DATA:  87 year old female with a fall  EXAM: MRA ABDOMEN AND PELVIS  WITHOUT CONTRAST  TECHNIQUE: Multiplanar, multiecho pulse sequences of the abdomen and pelvis were obtained without intravenous contrast. Angiographic images of abdomen and pelvis were obtained using MRA technique without intravenous contrast.  CONTRAST:  None  COMPARISON:  No prior CT imaging  FINDINGS: MRA abdomen and pelvis  Vascular:  Aorta: Diameter at the hiatus measures 2.3 cm. Diameter of the juxtarenal aorta measures 1.6 cm. Infrarenal abdominal aorta at the bifurcation measures 13 mm. There is no inflammatory signal in the periaortic region with fat signal maintained. Scattered signal loss within the aortic wall compatible with calcified atherosclerotic plaque. No evidence of dissection.  Celiac artery: Flow signal maintained in the proximal celiac artery. Study is nondiagnostic for evaluation of any possible stenosis at the origin.  SMA: Flow signal maintained in the SMA just beyond the origin. Study is nondiagnostic for evaluation of any possible stenosis at the origin.  IMA: Study is nondiagnostic for evaluation of patency of the IMA origin.  Renal arteries: Proximal left and right renal arteries maintain flow signal. MRI resolution is below that that would allow quantification of any possible stenosis.  Iliac arteries:  Flow signal maintained within the bilateral common iliac arteries, hypogastric arteries, external iliac arteries. Mild tortuosity of the bilateral iliac arterial system. No significant atherosclerotic plaque.  Venous: Unremarkable systemic venous system.  Portal veins: Flow signal maintained within the main portal vein, right left portal vein, splenic vein. Normal flow signal within the a hepatic veins  Nonvascular:  Unremarkable signal of the liver and gallbladder.  Pancreas: Focal T2 signal within the pancreatic head measuring 8 mm. Series 8 of the abdominal MRI, image 23.  Right kidney: Malrotation of the  right kidney.  Multiple T2 cystic lesions within the right kidney. None of these are completely characterized in the absence of contrast. No hydronephrosis.  Left kidney: Asymmetric size the left kidney compared to the right, with the left smaller. Multiple cystic lesions with T2 intensity, the largest on the upper pole measuring 4.9 cm. None of these are completely characterized in the absence of contrast.  Large colonic stool burden.  No evidence of bowel obstruction.  Rounded mass at the fundus of the uterus measuring 23 mm. The low signal is suggestive of partially calcified fibroid. Fluid signal within the endometrial canal.  Musculoskeletal: Relatively uniform marrow signal.  IMPRESSION: MR is negative for abdominal aortic aneurysm.  Aortic Atherosclerosis (ICD10-I70.0).  Large colonic stool burden.  No evidence of obstruction.  Cystic lesion in the head of the pancreas measures 8 mm. Differential diagnosis includes pseudocyst as well as cystic neoplasm. Given the size, a follow-up MRI protocol in 1 year is reasonable.  Bilateral renal cystic lesions, none of which are completely characterized in the absence of contrast. These may be better assessed by either a follow-up ultrasound or potentially a future MRI, as above.  Fluid signal within the endometrial canal. Referral for gynecologic evaluation recommended, as endometrial malignancy is not excluded.  Signed,  Dulcy Fanny. Dellia Nims, RPVI  [facility]  [facility]iology  Electronically Signed   By: Corrie Mckusick D.O.   On: 06/30/2020 09:50 MR ANGIO CHEST WO CONTRAST CLINICAL DATA:  87 year old female with a fall  EXAM: MRA CHEST WITH OR WITHOUT CONTRAST  TECHNIQUE: Angiographic images of the chest were obtained using MRA technique without intravenous contrast.  CONTRAST:  None  COMPARISON:  CT chest 06/27/2010  FINDINGS: VASCULAR  Aorta: Artifact at the aortic valve  compatible with aortic valve repair. Estimated diameter of the ascending aorta 3.6 cm. Distal thoracic aorta at the hiatus measures 2.3 cm.  No evidence of periaortic fluid or inflammatory change. No evidence of dissection. Mild atherosclerosis as was seen on prior CT.  Heart: Cardiomegaly. No pericardial fluid. Enlargement of the right-sided heart chambers, similar to the comparison CT chest.  Pulmonary Arteries:  Main pulmonary artery measures 2.8 cm.  NON-VASCULAR  Mediastinum: No mediastinal adenopathy. Esophagus is not well evaluated.  Lungs/pleura: No pleural effusion. Signal within the lungs unremarkable.  Musculoskeletal: Artifact at the right glenohumeral joint secondary to prior arthroplasty. Visualized thoracic spine unremarkable on the coronal images.  IMPRESSION: Aortic Atherosclerosis (ICD10-I70.0).  MRA is negative for thoracic aortic aneurysm, with maximum diameter of the ascending aorta estimated 3.6 cm.  Surgical changes of aortic valve repair.  Cardiomegaly.  Signed,  Dulcy Fanny. Dellia Nims, RPVI  [facility]  [facility] Radiology  Electronically Signed   By: Corrie Mckusick D.O.   On: 06/30/2020 09:29 MR BRAIN WO CONTRAST CLINICAL DATA:  Syncope  EXAM: MRI HEAD WITHOUT CONTRAST  TECHNIQUE: Multiplanar, multiecho pulse sequences of the brain and surrounding structures were obtained without intravenous contrast.  COMPARISON:  03/25/2020  FINDINGS: Brain: There is no acute infarction or intracranial hemorrhage. There is no parenchymal mass, mass effect, or edema. There is no hydrocephalus or extra-axial fluid collection.  There is a mass at the right cerebellopontine angle extending into the right internal auditory canal. This appears to be similar in size on these large field-of-view images. Prominence of the ventricles and sulci reflects mild generalized parenchymal volume loss. Patchy and confluent  areas of T2 hyperintensity in the supratentorial greater than  pontine white matter are nonspecific but probably reflect moderate to advanced chronic microvascular ischemic changes.  Vascular: Major vessel flow voids at the skull base are preserved.  Skull and upper cervical spine: Normal marrow signal is preserved.  Sinuses/Orbits: Paranasal sinuses are aerated. Bilateral lens replacements.  Other: Sella is unremarkable.  Mastoid air cells are clear.  IMPRESSION: No evidence of recent infarction or hemorrhage.  Moderate to advanced chronic microvascular ischemic changes.  Similar size of right vestibular schwannoma.  Electronically Signed   By: Macy Mis M.D.   On: 06/30/2020 08:42  Assessment  The primary encounter diagnosis was Occipital neuralgia of left side. Diagnoses of Chronic left-sided headaches, Closed nondisplaced fracture of surgical neck of left humerus, unspecified fracture morphology, sequela, and Chronic pain syndrome were also pertinent to this visit.  Plan of Care  Ms. MCKYNNA VANLOAN has a current medication list which includes the following long-term medication(s): budesonide, escitalopram, fluticasone-salmeterol, furosemide, ipratropium-albuterol, metoprolol succinate, hydralazine, and losartan.  Orders:  Orders Placed This Encounter  Procedures  . GREATER OCCIPITAL NERVE BLOCK    Standing Status:   Future    Standing Expiration Date:   03/15/2021    Scheduling Instructions:     Procedure: Occipital nerve block     Laterality: LEFT     Sedation: without     Timeframe: ASAA    Order Specific Question:   Where will this procedure be performed?    Answer:   [facility]   Follow-up plan:   Return in about 1 week (around 12/20/2020) for L GONB , without sedation.   Recent Visits Date Type Provider Dept  10/18/20 Procedure visit Gillis Santa, MD [facility]  Showing recent visits within past 90 days and meeting all other  requirements Today's Visits Date Type Provider Dept  12/13/20 Telemedicine Gillis Santa, MD [facility]  Showing today's visits and meeting all other requirements Future Appointments No visits were found meeting these conditions. Showing future appointments within next 90 days and meeting all other requirements  I discussed the assessment and treatment plan with the patient. The patient was provided an opportunity to ask questions and all were answered. The patient agreed with the plan and demonstrated an understanding of the instructions.  Patient advised to call back or seek an in-person evaluation if the symptoms or condition worsens.  Duration of encounter: 20 minutes.  Note by: Gillis Santa, MD Date: 12/13/2020; Time: 10:36 AM

## 2020-12-20 ENCOUNTER — Other Ambulatory Visit: Payer: Self-pay | Admitting: Physician Assistant

## 2020-12-20 ENCOUNTER — Ambulatory Visit: Payer: Medicare Other | Admitting: Student in an Organized Health Care Education/Training Program

## 2020-12-20 DIAGNOSIS — J449 Chronic obstructive pulmonary disease, unspecified: Secondary | ICD-10-CM

## 2020-12-26 ENCOUNTER — Telehealth: Payer: Self-pay

## 2020-12-26 NOTE — Telephone Encounter (Signed)
Her insurance denied authorization for the GONB. Her niece is very upset about this. Do you want me to try to see if they will do a peer to peer?

## 2020-12-27 ENCOUNTER — Other Ambulatory Visit: Payer: Self-pay

## 2020-12-27 ENCOUNTER — Encounter: Payer: Self-pay | Admitting: Student in an Organized Health Care Education/Training Program

## 2020-12-27 ENCOUNTER — Other Ambulatory Visit: Payer: Commercial Managed Care - HMO

## 2020-12-27 DIAGNOSIS — Z515 Encounter for palliative care: Secondary | ICD-10-CM

## 2020-12-27 NOTE — Progress Notes (Unsigned)
Marilyn Oconnell is a very pleasant 87 year old patient of mine at the [facility].  She has been treated with left sided greater occipital nerve blocks for occipital neuralgia.  She has had for occipital nerve blocks over the last year which provided her with significant pain relief in regards to the pain on the posterior portion of her scalp and has also reduced her headaches.  Patient's daughter called to request repeat occipital nerve block however it was to our disappointment that insurance company denied this procedure when [name] has demonstrated analgesic and functional improvement from this intervention.  It is unclear why the insurance company is denying a therapeutic procedure when there is been documented analgesic and functional benefit from her occipital nerve blocks in the past.  It is even more disappointing that we cannot do a peer to peer to speak with a medical professional to better understand why [name] cannot repeat a therapeutic procedure to help reduce her pain and suffering.

## 2020-12-27 NOTE — Progress Notes (Signed)
COMMUNITY PALLIATIVE CARE SW NOTE  PATIENT NAME: Marilyn Oconnell DOB: 10/05/1923 MRN: [id number]  PRIMARY CARE PROVIDER: Trinna Post, PA-C  RESPONSIBLE PARTY:  Acct ID - Guarantor Home Phone Work Phone Relationship Acct Type  [id number] Cecilio [phone]  Self P/F     [address] [place], [place] [address]     PLAN OF CARE and INTERVENTIONS:             1.  GOALS OF CARE/ ADVANCE CARE PLANNING:   Patient is a DNR. Patients plan is to remain at ALF.   2.         SOCIAL/EMOTIONAL/SPIRITUAL ASSESSMENT/ INTERVENTIONS:  SW and SW intern met with patient for follow up monthly visit. Patient resides in [facility].   Patient's mood was altered during this visit. Patient appeared to be down and sad. Patient stated that she was tired and just wanted to sleep. Patient shared that she is sleeping more now. No loss in appetite. No falls reported.  Patient stated that she has had migraines pain on and off recently. She stated that she receives shots in her neck every 6 months or so and may be due for another one soon, but unsure of next appointment.  Patient stated that she has recently had to go through the process of selling her home and she recently loss her brother. These events have affected her mood and increased depressive symptoms. Patient scored a 12 on PHQ-9 questionnaire which suggest moderate depression severity; patients should have a treatment plan ranging from counseling, follow-up, and/or pharmacotherapy. Functionally, the patient does not report limitations due to their symptoms. Patient declined on-going counseling services at this time. Patient is not taking anything for depression. Patient could benefit from more social activities and talking about grief/loss of her independence and previous home. Patients niece is supportive and visits often.   SW discussed goals, reviewed care plan, provided emotional support, used active and reflective listening to patient  and niece. Palliative care will continue to monitor patient at ALF.  3.         PATIENT/CAREGIVER EDUCATION/ COPING:  Patient alert and oriented. Patient is HOH and has poor vision. Patient was able to answer questions appropriately with increased volume. Patients spirits were jovial and euphoric during visit. Patients memory is fading a bit but she is alert and oriented to person, place and time. Niece is supportive.  4.         PERSONAL EMERGENCY PLAN:  follow facility procedures for 9-1-1 for emergencies.   5.         COMMUNITY RESOURCES COORDINATION/ HEALTH CARE NAVIGATION:  Niece manages care and is primary caregiver.  6.          FINANCIAL/LEGAL CONCERNS/INTERVENTIONS:  None.        SOCIAL HX:  Social History   Tobacco Use  . Smoking status: Never Smoker  . Smokeless tobacco: Never Used  Substance Use Topics  . Alcohol use: No    CODE STATUS: DNR  ADVANCED DIRECTIVES: Y MOST FORM COMPLETE:  N HOSPICE EDUCATION PROVIDED: N  PPS: Patient is independent with ADL's but is slower with the tasks. Staff is available for assistance.   Time spent: 45 min       [place], [place]

## 2020-12-28 ENCOUNTER — Telehealth: Payer: Self-pay

## 2020-12-28 ENCOUNTER — Telehealth: Payer: Self-pay | Admitting: Family Medicine

## 2020-12-28 NOTE — Telephone Encounter (Signed)
148 pm.  Return call made to [place], [place] regarding patient.  She is unavailable at this time but receptionist advised that patient would need to be seen in the office for further evaluation as a new patient.  Advised that I would contact Michelle-niece for patient to call the practice to schedule and appointment as she will be the one who takes patient to her appointment.  155 pm.  Phone call made to [facility] and advised her of above and symptoms of depression.  Sharyn Lull asked if patient explained why she was feeling depressed and stated the biggest issues is that patient's home is being sold.  She was not aware that PA was no longer at the practice and would reach out to the providers office.  She will also speak with the facility NP to see if patient can be seen and followed regarding this issue.  No further issues are noted at this time.

## 2020-12-28 NOTE — Telephone Encounter (Signed)
856 am.  Phone call made to PCP to advise of Palliative Care SW visit from yesterday.  Patient with increase sadness, tearfulness, increase sleeping and increase in fatigue.  Patient does not report a change in her appetite at this time.  In the last 6 months patient was moved to 2 ALF, sold her home, loss of independence and loss of her brother.   PHQ-9 Score is 12.  Patient decline counseling that was offered by SW.   Listed PCP is no longer with the practice.  Dr. Caryn Section will likely follow patient now.  Message will be sent to new provider regarding above.

## 2020-12-28 NOTE — Telephone Encounter (Signed)
Marilyn Oconnell called to speak with or let DR. Fisher know that the Education officer, museum had a visit with the Pt yesterday and she is showing signs of depression. Pt has been mover to 2 different living facilities and has had to sell her home as well as her brother passing last month. Pt is experiencing a loss of independence. She has been tearful during visits and shown increased sadness and fatigue. She was given a depression screening and scored a 12 on the PHQ9 / please call Marilyn Oconnell about options or information

## 2020-12-28 NOTE — Telephone Encounter (Signed)
Left message for Marilyn Oconnell to call back. Patient will need an appt to discuss and establish with new provider since Fabio Bering is no longer in the office. OK for PEC to schedule if she calls back.

## 2021-01-02 NOTE — Telephone Encounter (Signed)
Tried calling Marilyn Oconnell with palliative care. Left message to call back.

## 2021-01-03 ENCOUNTER — Telehealth: Payer: Self-pay

## 2021-01-03 ENCOUNTER — Other Ambulatory Visit: Payer: Self-pay | Admitting: Physician Assistant

## 2021-01-03 DIAGNOSIS — F3289 Other specified depressive episodes: Secondary | ICD-10-CM

## 2021-01-03 DIAGNOSIS — I10 Essential (primary) hypertension: Secondary | ICD-10-CM

## 2021-01-03 NOTE — Telephone Encounter (Signed)
Copied from [place] [id number]. Topic: General - Other >> Jan 03, 2021  2:01 PM Leward Quan A wrote: Reason for CRM: For [facility] Ms Vance Gather with palliative care called in to inform Rollene Fare that patient niece wanted to know if they can follow up with the signs of depressions with the patient. Ph# [phone]

## 2021-01-04 NOTE — Telephone Encounter (Signed)
   Notes to clinic: Review for refill Looks like patient was needing appointment with new provider to discuss medication    Requested Prescriptions  Pending Prescriptions Disp Refills   escitalopram (LEXAPRO) 20 MG tablet [Pharmacy Med Name: Escitalopram Oxalate 20 MG Oral Tablet] 90 tablet 3    Sig: TAKE 1 TABLET BY MOUTH  DAILY      Psychiatry:  Antidepressants - SSRI Passed - 01/03/2021  9:57 PM      Passed - Completed PHQ-2 or PHQ-9 in the last 360 days      Passed - Valid encounter within last 6 months    Recent Outpatient Visits           4 months ago Primary hypertension   [place], [place], PA-C   5 months ago Severe aortic stenosis   [place], [place], [place]   7 months ago Essential hypertension   [place], [place], PA-C   8 months ago Atrial fibrillation with [facility])   [place], [place], [place]   10 months ago Chronic nonintractable headache, unspecified headache type   [facility], Adriana M, PA-C                  losartan (COZAAR) 25 MG tablet [Pharmacy Med Name: Losartan Potassium 25 MG Oral Tablet] 45 tablet 3    Sig: TAKE ONE-HALF TABLET BY  MOUTH DAILY      Cardiovascular:  Angiotensin Receptor Blockers Failed - 01/03/2021  9:57 PM      Failed - Cr in normal range and within 180 days    Creat  Date Value Ref Range Status  07/10/2017 1.15 (H) 0.60 - 0.88 mg/dL Final    Comment:    For patients >19 years of age, the reference limit for Creatinine is approximately 13% higher for people identified as African-American. .    Creatinine, Ser  Date Value Ref Range Status  07/01/2020 1.53 (H) 0.44 - 1.00 mg/dL Final          Failed - K in normal range and within 180 days    Potassium  Date Value Ref Range Status  07/01/2020 3.8 3.5 - 5.1 mmol/L Final          Failed - Last BP in normal range    BP Readings from Last 1  Encounters:  11/28/20 (!) 182/72          Passed - Patient is not pregnant      Passed - Valid encounter within last 6 months    Recent Outpatient Visits           4 months ago Primary hypertension   [place], [place], PA-C   5 months ago Severe aortic stenosis   [facility], Fabio Bering M, [place]   7 months ago Essential hypertension   [place], [place], [place]   8 months ago Atrial fibrillation with [facility])   [place], [place], [place]   10 months ago Chronic nonintractable headache, unspecified headache type   [place], [place], [place]

## 2021-01-04 NOTE — Telephone Encounter (Signed)
Please advise. Thanks.  

## 2021-01-05 ENCOUNTER — Telehealth: Payer: Self-pay

## 2021-01-05 NOTE — Telephone Encounter (Signed)
Late Entry for 01/03/21 2 pm.  Message received from [place] regarding symptoms of depression.  Return call made and left message that I had spoken to patient's niece Marilyn Oconnell.  She will follow up with the NP for the assisted living facility to see if this can be addressed.  If the facility NP is unable to address this, the niece will contact PCP office to schedule an appointment with a new provider.

## 2021-01-08 ENCOUNTER — Ambulatory Visit
Payer: Medicare Other | Attending: Student in an Organized Health Care Education/Training Program | Admitting: Student in an Organized Health Care Education/Training Program

## 2021-01-08 ENCOUNTER — Other Ambulatory Visit: Payer: Self-pay

## 2021-01-08 ENCOUNTER — Encounter: Payer: Self-pay | Admitting: Student in an Organized Health Care Education/Training Program

## 2021-01-08 DIAGNOSIS — G894 Chronic pain syndrome: Secondary | ICD-10-CM | POA: Diagnosis not present

## 2021-01-08 DIAGNOSIS — Z952 Presence of prosthetic heart valve: Secondary | ICD-10-CM | POA: Diagnosis not present

## 2021-01-08 DIAGNOSIS — G8929 Other chronic pain: Secondary | ICD-10-CM

## 2021-01-08 DIAGNOSIS — M5481 Occipital neuralgia: Secondary | ICD-10-CM | POA: Insufficient documentation

## 2021-01-08 DIAGNOSIS — R519 Headache, unspecified: Secondary | ICD-10-CM | POA: Diagnosis not present

## 2021-01-08 MED ORDER — DEXAMETHASONE SODIUM PHOSPHATE 10 MG/ML IJ SOLN
INTRAMUSCULAR | Status: AC
  Filled 2021-01-08: qty 1

## 2021-01-08 MED ORDER — ROPIVACAINE HCL 2 MG/ML IJ SOLN
INTRAMUSCULAR | Status: AC
  Filled 2021-01-08: qty 10

## 2021-01-08 MED ORDER — DEXAMETHASONE SODIUM PHOSPHATE 10 MG/ML IJ SOLN
10.0000 mg | Freq: Once | INTRAMUSCULAR | Status: AC
  Administered 2021-01-08: 10 mg

## 2021-01-08 MED ORDER — ROPIVACAINE HCL 2 MG/ML IJ SOLN
2.0000 mL | Freq: Once | INTRAMUSCULAR | Status: AC
  Administered 2021-01-08: 2 mL via EPIDURAL

## 2021-01-08 NOTE — Progress Notes (Signed)
PROVIDER NOTE: Information contained herein reflects review and annotations entered in association with encounter. Interpretation of such information and data should be left to medically-trained personnel. Information provided to patient can be located elsewhere in the medical record under "Patient Instructions". Document created using STT-dictation technology, any transcriptional errors that may result from process are unintentional.    Patient: Marilyn Oconnell  Service Category: Procedure  Provider: Gillis Santa, MD  DOB: [birth year]  DOS: 01/08/2021  Location: [facility]  MRN: [id number]  Setting: Ambulatory - outpatient  Referring Provider: Gillis Santa, MD  Type: Established Patient  Specialty: Interventional Pain Management  PCP: Trinna Post, PA-C   Primary Reason for Visit: Interventional Pain Management Treatment. CC: Headache  Procedure:          Anesthesia, Analgesia, Anxiolysis:  Type: Therapeutic, Greater, Occipital Nerve Block  #5  Region: Posterolateral Cervical Level: Occipital Ridge   Laterality: Left-Sided  Type: Local Anesthesia  Local Anesthetic: Lidocaine 1-2%  Position: Prone   Indications: 1. Occipital neuralgia of left side   2. Chronic left-sided headaches   3. Chronic pain syndrome    Pain Score: Pre-procedure: 3 /10 Post-procedure: 3 /10   Pre-op Assessment:  Marilyn Oconnell is a 87 y.o. (year old), female patient, seen today for interventional treatment. She  has a past surgical history that includes Shoulder surgery (09/1999); Sigmoidoscopy (1992); Dilation and curettage of uterus (1988); and Aortic valve replacement (2006). Marilyn Oconnell has a current medication list which includes the following prescription(s): acetaminophen, albuterol, diclofenac sodium, escitalopram, fexofenadine, fluticasone-salmeterol, furosemide, ipratropium-albuterol, losartan, metoprolol succinate, budesonide, and hydralazine. Her primarily concern today is the  Headache  Initial Vital Signs:  Pulse/HCG Rate: (!) 58ECG Heart Rate: 67 Temp: (!) 97.3 F (36.3 C) Resp: 18 BP: (!) 166/71 SpO2: 100 %  BMI: Estimated body mass index is 19.91 kg/m as calculated from the following:   Height as of this encounter: 5\' 4"  (1.626 m).   Weight as of this encounter: 116 lb (52.6 kg).  Risk Assessment: Allergies: Reviewed. She is allergic to iodine, shellfish allergy, azithromycin, and sulfa antibiotics.  Allergy Precautions: None required Coagulopathies: Reviewed. None identified.  Blood-thinner therapy: None at this time Active Infection(s): Reviewed. None identified. Marilyn Oconnell is afebrile  Site Confirmation: Marilyn Oconnell was asked to confirm the procedure and laterality before marking the site Procedure checklist: Completed Consent: Before the procedure and under the influence of no sedative(s), amnesic(s), or anxiolytics, the patient was informed of the treatment options, risks and possible complications. To fulfill our ethical and legal obligations, as recommended by the American Medical Association's Code of Ethics, I have informed the patient of my clinical impression; the nature and purpose of the treatment or procedure; the risks, benefits, and possible complications of the intervention; the alternatives, including doing nothing; the risk(s) and benefit(s) of the alternative treatment(s) or procedure(s); and the risk(s) and benefit(s) of doing nothing. The patient was provided information about the general risks and possible complications associated with the procedure. These may include, but are not limited to: failure to achieve desired goals, infection, bleeding, organ or nerve damage, allergic reactions, paralysis, and death. In addition, the patient was informed of those risks and complications associated to the procedure, such as failure to decrease pain; infection; bleeding; organ or nerve damage with subsequent damage to sensory, motor, and/or  autonomic systems, resulting in permanent pain, numbness, and/or weakness of one or several areas of the body; allergic reactions; (i.e.: anaphylactic reaction); and/or death. Furthermore, the  patient was informed of those risks and complications associated with the medications. These include, but are not limited to: allergic reactions (i.e.: anaphylactic or anaphylactoid reaction(s)); adrenal axis suppression; blood sugar elevation that in diabetics may result in ketoacidosis or comma; water retention that in patients with history of congestive heart failure may result in shortness of breath, pulmonary edema, and decompensation with resultant heart failure; weight gain; swelling or edema; medication-induced neural toxicity; particulate matter embolism and blood vessel occlusion with resultant organ, and/or nervous system infarction; and/or aseptic necrosis of one or more joints. Finally, the patient was informed that Medicine is not an exact science; therefore, there is also the possibility of unforeseen or unpredictable risks and/or possible complications that may result in a catastrophic outcome. The patient indicated having understood very clearly. We have given the patient no guarantees and we have made no promises. Enough time was given to the patient to ask questions, all of which were answered to the patient's satisfaction. Marilyn Oconnell has indicated that she wanted to continue with the procedure. Attestation: I, the ordering provider, attest that I have discussed with the patient the benefits, risks, side-effects, alternatives, likelihood of achieving goals, and potential problems during recovery for the procedure that I have provided informed consent. Date  Time: 01/08/2021 11:02 AM  Pre-Procedure Preparation:  Monitoring: As per clinic protocol. Respiration, ETCO2, SpO2, BP, heart rate and rhythm monitor placed and checked for adequate function Safety Precautions: Patient was assessed for positional  comfort and pressure points before starting the procedure. Time-out: I initiated and conducted the "Time-out" before starting the procedure, as per protocol. The patient was asked to participate by confirming the accuracy of the "Time Out" information. Verification of the correct person, site, and procedure were performed and confirmed by me, the nursing staff, and the patient. "Time-out" conducted as per Joint Commission's Universal Protocol (UP.01.01.01). Time: 1123  Description of Procedure:          Target Area: Area medial to the occipital artery at the level of the superior nuchal ridge Approach: Posterior approach Area Prepped: Entire Posterior Occipital Region DuraPrep (Iodine Povacrylex [0.7% available iodine] and Isopropyl Alcohol, 74% w/w) Safety Precautions: Aspiration looking for blood return was conducted prior to all injections. At no point did we inject any substances, as a needle was being advanced. No attempts were made at seeking any paresthesias. Safe injection practices and needle disposal techniques used. Medications properly checked for expiration dates. SDV (single dose vial) medications used. Description of the Procedure: Protocol guidelines were followed. The target area was identified and the area prepped in the usual manner. Skin & deeper tissues infiltrated with local anesthetic. Appropriate amount of time allowed to pass for local anesthetics to take effect. The procedure needles were then advanced to the target area. Proper needle placement secured. Negative aspiration confirmed. Solution injected in intermittent fashion, asking for systemic symptoms every 0.5cc of injectate. The needles were then removed and the area cleansed, making sure to leave some of the prepping solution back to take advantage of its long term bactericidal properties.  Vitals:   01/08/21 1106 01/08/21 1107 01/08/21 1120 01/08/21 1131  BP: (!) 166/71 (!) 203/86 (!) 185/57 (!) 186/67  Pulse: (!) 58  70 65 70  Resp:   18 16  Temp: (!) 97.3 F (36.3 C)     SpO2: 100% 100% 99% 99%  Weight: 116 lb (52.6 kg)     Height: 5\' 4"  (1.626 m)       Start  Time: 1123 hrs. End Time: 1126 hrs. Materials:  Needle(s) Type: Regular needle Gauge: 25G Length: 1.5-in Medication(s): Please see orders for medications and dosing details. 2cc solution made of 1 cc of 0.2% ropivacaine, 1 cc of decadron, 10 mg/cc.  This was injected for the left greater occipital nerve.  Post-operative Assessment:  Post-procedure Vital Signs:  Pulse/HCG Rate: 7070 Temp: (!) 97.3 F (36.3 C) Resp: 16 BP: (!) 186/67 SpO2: 99 %  EBL: None  Complications: No immediate post-treatment complications observed by team, or reported by patient.  Note: The patient tolerated the entire procedure well. A repeat set of vitals were taken after the procedure and the patient was kept under observation following institutional policy, for this type of procedure. Post-procedural neurological assessment was performed, showing return to baseline, prior to discharge. The patient was provided with post-procedure discharge instructions, including a section on how to identify potential problems. Should any problems arise concerning this procedure, the patient was given instructions to immediately contact us, at any time, without hesitation. In any case, we plan to contact the patient by telephone for a follow-up status report regarding this interventional procedure.  Comments:  No additional relevant information.  Plan of Care   Medications ordered for procedure: Meds ordered this encounter  Medications  . ropivacaine (PF) 2 mg/mL (0.2%) (NAROPIN) injection 2 mL  . dexamethasone (DECADRON) injection 10 mg   Medications administered: We administered ropivacaine (PF) 2 mg/mL (0.2%) and dexamethasone.  See the medical record for exact dosing, route, and time of administration.  Follow-up plan:   Return if symptoms worsen or fail to  improve.    Recent Visits Date Type Provider Dept  12/13/20 Telemedicine Gillis Santa, MD [facility]  10/18/20 Procedure visit Gillis Santa, MD [facility]  Showing recent visits within past 90 days and meeting all other requirements Today's Visits Date Type Provider Dept  01/08/21 Procedure visit Gillis Santa, MD [facility]  Showing today's visits and meeting all other requirements Future Appointments No visits were found meeting these conditions. Showing future appointments within next 90 days and meeting all other requirements  Disposition: Discharge home  Discharge (Date  Time): 01/08/2021; 1140 hrs.   Primary Care Physician: Paulene Floor Location: [facility] Facility Note by: Gillis Santa, MD Date: 01/08/2021; Time: 11:46 AM  Disclaimer:  Medicine is not an exact science. The only guarantee in medicine is that nothing is guaranteed. It is important to note that the decision to proceed with this intervention was based on the information collected from the patient. The Data and conclusions were drawn from the patient's questionnaire, the interview, and the physical examination. Because the information was provided in large part by the patient, it cannot be guaranteed that it has not been purposely or unconsciously manipulated. Every effort has been made to obtain as much relevant data as possible for this evaluation. It is important to note that the conclusions that lead to this procedure are derived in large part from the available data. Always take into account that the treatment will also be dependent on availability of resources and existing treatment guidelines, considered by other Pain Management Practitioners as being common knowledge and practice, at the time of the intervention. For Medico-Legal purposes, it is also important to point out that variation in procedural techniques and pharmacological choices are the  acceptable norm. The indications, contraindications, technique, and results of the above procedure should only be interpreted and judged by a Board-Certified Interventional  Pain Specialist with extensive familiarity and expertise in the same exact procedure and technique.

## 2021-01-08 NOTE — Patient Instructions (Signed)
Occipital Nerve Block Patient Information  Description: The occipital nerves originate in the cervical (neck) spinal cord and travel upward through muscle and tissue to supply sensation to the back of the head and top of the scalp.  In addition, the nerves control some of the muscles of the scalp.  Occipital neuralgia is an irritation of these nerves which can cause headaches, numbness of the scalp, and neck discomfort.     The occipital nerve block will interrupt nerve transmission through these nerves and can relieve pain and spasm.  The block consists of insertion of a small needle under the skin in the back of the head to deposit local anesthetic (numbing medicine) and/or steroids around the nerve.  The entire block usually lasts less than 5 minutes.  Conditions which may be treated by occipital blocks:   Muscular pain and spasm of the scalp  Nerve irritation, back of the head  Headaches  Upper neck pain  Preparation for the injection:  1. Do not eat any solid food or dairy products within 8 hours of your appointment. 2. You may drink clear liquids up to 3 hours before appointment.  Clear liquids include water, black coffee, juice or soda.  No milk or cream please. 3. You may take your regular medication, including pain medications, with a sip of water before you appointment.  Diabetics should hold regular insulin (if taken separately) and take 1/2 normal NPH dose the morning of the procedure.  Carry some sugar containing items with you to your appointment. 4. A driver must accompany you and be prepared to drive you home after your procedure. 5. Bring all your current medications with you. 6. An IV may be inserted and sedation may be given at the discretion of the physician. 7. A blood pressure cuff, EKG, and other monitors will often be applied during the procedure.  Some patients may need to have extra oxygen administered for a short period. 8. You will be asked to provide medical  information, including your allergies and medications, prior to the procedure.  We must know immediately if you are taking blood thinners (like Coumadin/Warfarin) or if you are allergic to IV iodine contrast (dye).  We must know if you could possible be pregnant.  9. Do not wear a high collared shirt or turtleneck.  Tie long hair up in the back if possible.  Possible side-effects:   Bleeding from needle site  Infection (rare, may require surgery)  Nerve injury (rare)  Hair on back of neck can be tinged with iodine scrub (this will wash out)  Light-headedness (temporary)  Pain at injection site (several days)  Decreased blood pressure (rare, temporary)  Seizure (very rare)  Call if you experience:   Hives or difficulty breathing ( go to the emergency room)  Inflammation or drainage at the injection site(s)  Please note:  Although the local anesthetic injected can often make your painful muscles or headache feel good for several hours after the injection, the pain may return.  It takes 3-7 days for steroids to work.  You may not notice any pain relief for at least one week.  If effective, we will often do a series of injections spaced 3-6 weeks apart to maximally decrease your pain.  If you have any questions, please call [phone] [facility] Pain Management Discharge Instructions  General Discharge Instructions :  If you need to reach your doctor call: Monday-Friday 8:00 am - 4:00 pm at [phone] or  toll free [phone].  After clinic hours [phone] to have operator reach doctor.  Bring all of your medication bottles to all your appointments in the pain clinic.  To cancel or reschedule your appointment with Pain Management please remember to call 24 hours in advance to avoid a fee.  Refer to the educational materials which you have been given on: General Risks, I had my Procedure. Discharge Instructions, Post  Sedation.  Post Procedure Instructions:  The drugs you were given will stay in your system until tomorrow, so for the next 24 hours you should not drive, make any legal decisions or drink any alcoholic beverages.  You may eat anything you prefer, but it is better to start with liquids then soups and crackers, and gradually work up to solid foods.  Please notify your doctor immediately if you have any unusual bleeding, trouble breathing or pain that is not related to your normal pain.  Depending on the type of procedure that was done, some parts of your body may feel week and/or numb.  This usually clears up by tonight or the next day.  Walk with the use of an assistive device or accompanied by an adult for the 24 hours.  You may use ice on the affected area for the first 24 hours.  Put ice in a Ziploc bag and cover with a towel and place against area 15 minutes on 15 minutes off.  You may switch to heat after 24 hours.

## 2021-01-08 NOTE — Progress Notes (Signed)
Safety precautions to be maintained throughout the outpatient stay will include: orient to surroundings, keep bed in low position, maintain call bell within reach at all times, provide assistance with transfer out of bed and ambulation.  

## 2021-01-09 ENCOUNTER — Telehealth: Payer: Self-pay | Admitting: *Deleted

## 2021-01-09 NOTE — Telephone Encounter (Signed)
Called to check on patient post injection. LVM with clinical coordinator Deatra Ina) to return call for any problems or concerns with Marilyn Oconnell post procedure.

## 2021-01-16 ENCOUNTER — Other Ambulatory Visit: Payer: Self-pay | Admitting: Physician Assistant

## 2021-01-16 DIAGNOSIS — I10 Essential (primary) hypertension: Secondary | ICD-10-CM

## 2021-01-16 MED ORDER — HYDRALAZINE HCL 10 MG PO TABS: 10.0000 mg | ORAL_TABLET | Freq: Two times a day (BID) | ORAL | 0 refills | Status: DC

## 2021-01-16 NOTE — Telephone Encounter (Signed)
Copied from [place] [phone]. Topic: Quick Communication - Rx Refill/Question >> Jan 16, 2021  1:26 PM Tessa Lerner A wrote: Medication: hydrALAZINE (APRESOLINE) 10 MG tablet   Has the patient contacted their pharmacy? No. Patient's niece will be using this pharmacy for the first time.  Preferred Pharmacy (with phone number or street name): [place], [place] - [address]  Phone:  [phone] Fax:  [phone]  Agent: Please be advised that RX refills may take up to 3 business days. We ask that you follow-up with your pharmacy.

## 2021-01-16 NOTE — Telephone Encounter (Signed)
  Notes to clinic: Patient niece would like to have a 30 day supply into pharmacy until she is able to schedule her for a appointment    Requested Prescriptions  Pending Prescriptions Disp Refills   hydrALAZINE (APRESOLINE) 10 MG tablet 180 tablet 0    Sig: Take 1 tablet (10 mg total) by mouth 2 (two) times daily.      Cardiovascular:  Vasodilators Failed - 01/16/2021  1:31 PM      Failed - HCT in normal range and within 360 days    HCT  Date Value Ref Range Status  07/01/2020 31.1 (L) 36.0 - 46.0 % Final   Hematocrit  Date Value Ref Range Status  02/26/2019 33.2 (L) 34.0 - 46.6 % Final          Failed - HGB in normal range and within 360 days    Hemoglobin  Date Value Ref Range Status  07/01/2020 10.3 (L) 12.0 - 15.0 g/dL Final  02/26/2019 11.4 11.1 - 15.9 g/dL Final          Failed - RBC in normal range and within 360 days    RBC  Date Value Ref Range Status  07/01/2020 3.36 (L) 3.87 - 5.11 MIL/uL Final          Failed - Last BP in normal range    BP Readings from Last 1 Encounters:  01/08/21 (!) 186/67          Passed - WBC in normal range and within 360 days    WBC  Date Value Ref Range Status  07/01/2020 6.4 4.0 - 10.5 K/uL Final          Passed - PLT in normal range and within 360 days    Platelets  Date Value Ref Range Status  07/01/2020 195 150 - 400 K/uL Final  02/26/2019 195 150 - 450 x10E3/uL Final          Passed - Valid encounter within last 12 months    Recent Outpatient Visits           4 months ago Primary hypertension   [place], [place], PA-C   6 months ago Severe aortic stenosis   [place], [place], PA-C   7 months ago Essential hypertension   [place], [place], [place]   9 months ago Atrial fibrillation with [facility])   [place], [place], [place]   11 months ago Chronic nonintractable headache, unspecified headache type    [place], [place], [place]

## 2021-02-07 ENCOUNTER — Other Ambulatory Visit: Payer: Self-pay

## 2021-02-07 ENCOUNTER — Non-Acute Institutional Stay: Payer: Commercial Managed Care - HMO

## 2021-02-07 VITALS — BP 172/60 | HR 68 | Temp 98.0°F | Resp 16 | Wt 114.0 lb

## 2021-02-07 DIAGNOSIS — Z515 Encounter for palliative care: Secondary | ICD-10-CM

## 2021-02-07 NOTE — Progress Notes (Signed)
PATIENT NAME: TAHARA RUFFINI DOB: 1923-10-13 MRN: [id number]  PRIMARY CARE PROVIDER: Trinna Post, PA-C  RESPONSIBLE PARTY:  Acct ID - Guarantor Home Phone Work Phone Relationship Acct Type  [id number] BAYA, [phone]  Self P/F     [address] [place], [place],  [address]    PLAN OF CARE and INTERVENTIONS:               1.  GOALS OF CARE/ ADVANCE CARE PLANNING:  Remain in ALF and as independent as possible.               2.  PATIENT/CAREGIVER EDUCATION:  Safety               4. PERSONAL EMERGENCY PLAN:  Activate 911 for emergencies.               5.  DISEASE STATUS:  Patient found in her room completing breakfast.  She is dressed and sitting in her chair.  Patient denies any pain or discomfort.  She continues to utilize a rolling walker with ambulation.  She denies any recent falls but states she lost her balance and was able to correct herself. Encourage patient to continue to utilize her walker for safety. Patient continues to ambulate down the hallways for exercise.   She continues to dress and toilet herself independently.  Patient does have one person assistance with showers for safety.  Patient denies shortness of breath.  She continues with breathing treatments routinely and is currently on Advair.   Staff note patient is doing well but continues to have periods of tearfulness.  Patient did not display any sadness on my visit today.  She is engaging in conversation and talking about her family.  Patient received flowers from her brother-in-law and states she enjoys flowers.   BP remains elevated and I have updated Lattie Haw, [place].  She reports the [facility] NP is in-house today and she will notify her of the elevated bp and tearful behaviors reported by staff.  Niece, Sharyn Lull notified of my visit today.   HISTORY OF PRESENT ILLNESS:  87 year old female with a history of CHF and HTN.  Patient is being followed by Palliative Care monthly and PRN.  CODE STATUS: DNR-gold  from on the facility chart.  ADVANCED DIRECTIVES: No MOST FORM: No PPS: 50%   PHYSICAL EXAM:   VITALS: Today's Vitals   02/07/21 0916  BP: (!) 172/60  Pulse: 68  Resp: 16  Temp: 98 F (36.7 C)  SpO2: 95%  PainSc: 0-No pain    LUNGS: CTA CARDIAC: HRR EXTREMITIES: - for edema SKIN: Warm and dry to touch.  Small lesion on the tip of the nose. NEURO: alert and oriented x 3       Lorenza Burton, RN

## 2021-02-13 ENCOUNTER — Telehealth: Payer: Self-pay

## 2021-02-13 DIAGNOSIS — I10 Essential (primary) hypertension: Secondary | ICD-10-CM

## 2021-02-13 MED ORDER — FUROSEMIDE 20 MG PO TABS: 20.0000 mg | ORAL_TABLET | Freq: Every day | ORAL | 1 refills | Status: AC

## 2021-02-13 NOTE — Telephone Encounter (Signed)
[facility] faxed refill request for the following medications:  furosemide (LASIX) 20 MG tablet  Please advise.

## 2021-02-16 ENCOUNTER — Non-Acute Institutional Stay: Payer: Commercial Managed Care - HMO

## 2021-02-16 ENCOUNTER — Other Ambulatory Visit: Payer: Self-pay

## 2021-02-16 VITALS — BP 192/70 | HR 71 | Temp 97.7°F | Resp 20

## 2021-02-16 DIAGNOSIS — Z515 Encounter for palliative care: Secondary | ICD-10-CM

## 2021-02-16 NOTE — Progress Notes (Signed)
PATIENT NAME: Marilyn Oconnell DOB: 12-19-1923 MRN: [id number]  PRIMARY CARE PROVIDER: Trinna Post, PA-C  RESPONSIBLE PARTY:  Acct ID - Guarantor Home Phone Work Phone Relationship Acct Type  [id number] Cecilio [phone]  Self P/F     [address], [place], [place] [address]    PLAN OF CARE and INTERVENTIONS:               1.  GOALS OF CARE/ ADVANCE CARE PLANNING:  Remain as independent for as long as possible.  Niece is willing to move patient to Memory Care or SNF if needed.               2.  PATIENT/CAREGIVER EDUCATION:  Medications               4. PERSONAL EMERGENCY PLAN:  Activate 911 for emergencies.               5.  DISEASE STATUS:  Joint visit completed with Michelle-niece and patient.  Rechecked patient's bp and it remains elevated.  I spoke with facility LPN Petra Kuba who states BP's were being checked 1-2 x daily.  [facility] NP is aware and wanted to monitor blood pressures to ensure this was not related to anxiety/tearfulness.  Reviewed bp readings and am readings are more elevated.  Reviewed medications as med tech advised niece that medications needed refilled.  Niece has medication refilled through [facility] on a monthly basis and medications should not be out at this time. Facility LPN also pulled medication cards and it appears 2nd dose of bp medication is not consistently being given which is likely the reason for the elevated am bp.   Niece noted patient was having hallucinations last week.  U/A was ordered and came back -.  Hallucinations have resolved this week.  Niece is looking into moving patient to another room that is closer to the dinning room.  Patient does not leave her room due to the distance from her room to activities and the dinning room. Patient would engage in activities and eat in the dinning room if she was closer.  Patient is nearly blind and trying to find her way back to her room is a challenge in addition to her endurance level.   If patient agrees  with the move to another room, niece will place a wreath or some type of identifying object on the door to help patient find her way back.   Niece has also looked at the Memory Care Unit and will consider SNF if patient requires that level of care at some point.   Patient has orders for neurology consult to further assess for Lewy Body Dementia.  An appointment has not yet been scheduled but niece will let me know the outcome of this.   There have been no tearful behaviors noted with niece or on my last 2 visits.  Staff also note patient has been doing well.   Follow up visit will be made next week to re-assess blood pressure and any medication changes.  HISTORY OF PRESENT ILLNESS:  87 year old female with severe Aortic Stenosis and HTN.  Patient is being followed by Palliative Care monthly and PRN.   CODE STATUS: DNR-form on the chart. ADVANCED DIRECTIVES: No MOST FORM: No PPS: 50%   PHYSICAL EXAM:   VITALS: Today's Vitals   02/16/21 1000  BP: (!) 192/70  Pulse: 71  Resp: 20  Temp: 97.7 F (36.5 C)  SpO2: 97%  PainSc:  0-No pain         Lorenza Burton, RN

## 2021-02-21 ENCOUNTER — Other Ambulatory Visit: Payer: Self-pay

## 2021-02-21 ENCOUNTER — Other Ambulatory Visit: Payer: Medicare Other

## 2021-02-21 VITALS — BP 164/76 | HR 73 | Temp 98.3°F | Resp 18

## 2021-02-21 DIAGNOSIS — Z515 Encounter for palliative care: Secondary | ICD-10-CM

## 2021-02-21 NOTE — Progress Notes (Signed)
PATIENT NAME: Marilyn Oconnell DOB: 12-Jan-1924 MRN: [id number]  PRIMARY CARE PROVIDER: Trinna Post, PA-C  RESPONSIBLE PARTY:  Acct ID - Guarantor Home Phone Work Phone Relationship Acct Type  [id number] Cecilio [phone]  Self P/F     [address], [place], [place] [address]    PLAN OF CARE and INTERVENTIONS:               1.  GOALS OF CARE/ ADVANCE CARE PLANNING:  Remain in ALF and independent for as long as possible.               2.  PATIENT/CAREGIVER EDUCATION:  CHF and Sodium               4. PERSONAL EMERGENCY PLAN:  Activate 911 for emergencies.               5.  DISEASE STATUS:  Patient found in her chair waiting for breakfast.  Patient requested a cup of coffee with cream and sugar.  Obtained coffee for patient and staff stated breakfast would be down shortly.  Patient is continues to have breakfast in her room but will often try to walk to the dinning room for dinner.  Patient is planning on moving to her new room tomorrow.  This will be closer to activities, sitting area and dinning room.  Patient is looking forward to the move has it has become more difficult for her to ambulate long distances.  Patient denies pain but states she has some issues with reflux.  Not currently on any reflux medication and patient denies the need for anything at this time.  Discussed current diet as patient is eating bacon or sausage daily.  She states this has not been her normal diet at home.  Patient recalls eating cereal or oatmeal/grits or toast with jelly.  Occasional fruit such as a banana is consumed.  We discuss limiting her salt intake due to her hx of CHF.  Patient is currently on a regular diet.  Notified niece Sharyn Lull that this could be changed to a NAS diet but would need to be ordered by the PA.  Patient will be seen on Friday to be established with a new PCP.   Staff report patient ambulated to the dinning room on Sunday with her rolling walker.  She was fatigued after the walk  but enjoyed eating with other residents.   Chart reviewed.  DNR form present.  No new orders noted.   HISTORY OF PRESENT ILLNESS:  87 year old female with a hx of CHF and HTN.  Patient is being followed by Palliative Care monthly and PRN.  CODE STATUS: DNR  ADVANCED DIRECTIVES: No MOST FORM: No PPS: 50%   PHYSICAL EXAM:   VITALS: Today's Vitals   02/21/21 0829  BP: (!) 164/76  Pulse: 73  Resp: 18  Temp: 98.3 F (36.8 C)  SpO2: 95%  PainSc: 0-No pain    LUNGS: CTA, no wheezes, rhonchi or rales present. CARDIAC: HRR, + murmur EXTREMITIES: - for edema SKIN: warm and dry to touch.  No skin breakdown present.  NEURO: alert and oriented x 4       Lorenza Burton, RN

## 2021-02-23 ENCOUNTER — Other Ambulatory Visit: Payer: Self-pay

## 2021-02-23 ENCOUNTER — Encounter: Payer: Self-pay | Admitting: Family Medicine

## 2021-02-23 ENCOUNTER — Ambulatory Visit (INDEPENDENT_AMBULATORY_CARE_PROVIDER_SITE_OTHER): Payer: Commercial Managed Care - HMO | Admitting: Family Medicine

## 2021-02-23 VITALS — BP 164/54 | HR 63 | Resp 15 | Wt 114.4 lb

## 2021-02-23 DIAGNOSIS — J449 Chronic obstructive pulmonary disease, unspecified: Secondary | ICD-10-CM | POA: Diagnosis not present

## 2021-02-23 DIAGNOSIS — I5032 Chronic diastolic (congestive) heart failure: Secondary | ICD-10-CM

## 2021-02-23 DIAGNOSIS — I350 Nonrheumatic aortic (valve) stenosis: Secondary | ICD-10-CM

## 2021-02-23 DIAGNOSIS — R54 Age-related physical debility: Secondary | ICD-10-CM

## 2021-02-23 DIAGNOSIS — I10 Essential (primary) hypertension: Secondary | ICD-10-CM

## 2021-02-23 NOTE — Progress Notes (Signed)
Established patient visit   Patient: Marilyn Oconnell   DOB: 01/25/1924   87 y.o. Female  MRN: [id number] Visit Date: 02/23/2021  Today's healthcare provider: Vernie Murders, PA-C   Chief Complaint  Patient presents with  . Hypertension  . Depression   Subjective    HPI  Hypertension, follow-up  BP Readings from Last 3 Encounters:  02/23/21 (!) 164/54  02/21/21 (!) 164/76  02/16/21 (!) 192/70   Wt Readings from Last 3 Encounters:  02/23/21 114 lb 6.4 oz (51.9 kg)  02/07/21 114 lb (51.7 kg)  01/08/21 116 lb (52.6 kg)     She was last seen for hypertension 6 months ago.  BP at that visit was 134/53. Management since that visit includes restarting Lasix.  She reports good compliance with treatment. She is not having side effects.  She is following a Regular diet. She is not exercising. She does not smoke.  Use of agents associated with hypertension: none.   Outside blood pressures are systolic ranging from 073X-106Y and diastolic >69. Daughter who is accompanied with patient states that she believes blood pressure readings have been higher because patient has a lot of salt in her diet.  Symptoms: No chest pain No chest pressure  No palpitations No syncope  No dyspnea No orthopnea  No paroxysmal nocturnal dyspnea No lower extremity edema   Pertinent labs: Lab Results  Component Value Date   CHOL 233 (H) 07/01/2020   HDL 55 07/01/2020   LDLCALC 167 (H) 07/01/2020   TRIG 57 07/01/2020   CHOLHDL 4.2 07/01/2020   Lab Results  Component Value Date   NA 137 07/01/2020   K 3.8 07/01/2020   CREATININE 1.53 (H) 07/01/2020   GFRNONAA 29 (L) 07/01/2020   GFRAA 32 (L) 06/07/2020   GLUCOSE 97 07/01/2020     The ASCVD Risk score (Goff DC Jr., et al., 2013) failed to calculate for the following reasons:   The 2013 ASCVD risk score is only valid for ages 54 to 11    --------------------------------------------------------------------------------------------------- Depression, Follow-up  She  was last seen for this 6 months ago. Changes made at last visit include none continue Lexapro 20mg .   She reports good compliance with treatment. She is not having side effects.   She reports good tolerance of treatment. Current symptoms include: depressed mood and fatigue She feels she is Unchanged since last visit.  Depression screen [facility] 2/9 02/23/2021 08/24/2020 08/09/2020  Decreased Interest 2 2 0  Down, Depressed, Hopeless 2 2 0  PHQ - 2 Score 4 4 0  Altered sleeping 2 2 -  Tired, decreased energy 2 2 -  Change in appetite 1 2 -  Feeling bad or failure about yourself  2 0 -  Trouble concentrating 1 2 -  Moving slowly or fidgety/restless 1 0 -  Suicidal thoughts 0 0 -  PHQ-9 Score 13 12 -  Difficult doing work/chores Very difficult Somewhat difficult -    ----------------------------------------------------------------------------------------- Patient Active Problem List   Diagnosis Date Noted  . Fall 06/30/2020  . Hypertension   . Coronary artery disease   . COPD (chronic obstructive pulmonary disease) ([place])   . Hypertensive urgency   . Elevated troponin   . PAF (paroxysmal atrial fibrillation) ([place])   . Chronic diastolic CHF (congestive heart failure) ([place])   . Current mild episode of major depressive disorder, unspecified whether recurrent ([place]) 04/25/2020  . Advanced age 47/09/2019  . 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 Blindness  03/23/2020  . Atrial fibrillation with RVR ([place]) 03/22/2020  . Chronic pain syndrome 03/16/2020  . Closed nondisplaced fracture of surgical neck of left humerus 03/16/2020  . Chronic left-sided headaches 03/16/2020  . Occipital neuralgia of left side 03/16/2020  . Syncope 07/20/2019  . Recurrent syncope 07/19/2019  . Insomnia 03/18/2016  . Visual hallucinations 03/18/2016  . Vertigo 03/15/2016  .  Paroxysmal atrial fibrillation with RVR ([place]) 11/27/2015  . Depression 11/27/2015  . Adjustment disorder with disturbance of emotion 09/28/2015  . Severe aortic stenosis 09/28/2015  . Bilateral cataracts 09/28/2015  . Cardiac murmur 09/28/2015  . Acute renal failure superimposed on stage 3b chronic kidney disease ([place]) 09/28/2015  . History of gout 09/28/2015  . 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/2015  . OP (osteoporosis) 09/28/2015  . Atherosclerosis of coronary artery 09/08/2015  . Avitaminosis D 07/11/2015  . LBP (low back pain) 07/11/2015  . BP (high blood pressure) 07/11/2015  . HLD (hyperlipidemia) 04/12/2015  . Billowing mitral valve 08/03/2014  . Fibrosis of lung ([place]) 05/25/2014  . Asthma-chronic obstructive pulmonary disease overlap syndrome ([place]) 05/25/2014  . H/O aortic valve replacement 11/09/2004   Past Medical History:  Diagnosis Date  . Arthritis   . CHF (congestive heart failure) ([place])   . COPD (chronic obstructive pulmonary disease) ([place])   . Coronary artery disease   . Hypercholesteremia   . Hypertension    Allergies  Allergen Reactions  . Iodine Anaphylaxis  . Shellfish Allergy Anaphylaxis  . Azithromycin Hives  . Sulfa Antibiotics Hives       Medications: Outpatient Medications Prior to Visit  Medication Sig  . acetaminophen (TYLENOL) 500 MG tablet Take 500-1,000 mg by mouth 2 (two) times daily as needed for mild pain, moderate pain, fever or headache.   . albuterol (PROVENTIL) (2.5 MG/3ML) 0.083% nebulizer solution Take 2.5 mg by nebulization every 8 (eight) hours as needed for wheezing or shortness of breath.   . budesonide (PULMICORT) 0.5 MG/2ML nebulizer solution Take 0.5 mg by nebulization at bedtime.  . diclofenac Sodium (VOLTAREN) 1 % GEL Apply topically as needed.   Marland Kitchen escitalopram (LEXAPRO) 20 MG tablet TAKE 1 TABLET BY MOUTH  DAILY  . fexofenadine (ALLEGRA) 180 MG tablet Take 180 mg by mouth daily.  . Fluticasone-Salmeterol (ADVAIR) 250-50 MCG/DOSE  AEPB INHALE ONE PUFF INTO LUNGS EVERY 12 HOURS. RINSE MOUTH AFTER USE  . furosemide (LASIX) 20 MG tablet Take 1 tablet (20 mg total) by mouth daily.  . hydrALAZINE (APRESOLINE) 10 MG tablet Take 1 tablet (10 mg total) by mouth 2 (two) times daily.  Marland Kitchen ipratropium-albuterol (DUONEB) 0.5-2.5 (3) MG/3ML SOLN Take 3 mLs by nebulization 3 (three) times daily. (Patient taking differently: Take 3 mLs by nebulization in the morning and at bedtime.)  . losartan (COZAAR) 25 MG tablet TAKE ONE-HALF TABLET BY  MOUTH DAILY  . metoprolol succinate (TOPROL-XL) 50 MG 24 hr tablet TAKE 1 TABLET BY MOUTH  DAILY WITH OR IMMEDIATELY  FOLLOWING A MEAL   No facility-administered medications prior to visit.    Review of Systems  Constitutional: Positive for fatigue.  HENT: Negative.   Respiratory: Positive for wheezing.        Controlled by Duoneb by nebulizer.  Cardiovascular: Negative.   Gastrointestinal: Negative.   Genitourinary: Negative.   Musculoskeletal: Negative.         Objective    BP (!) 164/54   Pulse 63   Resp 15   Wt 114 lb 6.4 oz (51.9 kg)   SpO2 97%  BMI 19.64 kg/m     Physical Exam Constitutional:      General: She is not in acute distress.    Appearance: She is well-developed.  HENT:     Head: Normocephalic and atraumatic.     Right Ear: Hearing and tympanic membrane normal.     Left Ear: Hearing and tympanic membrane normal.     Ears:     Comments: Decreased hearing.    Nose: Nose normal.  Eyes:     General: Lids are normal. No scleral icterus.       Right eye: No discharge.        Left eye: No discharge.     Conjunctiva/sclera: Conjunctivae normal.     Comments: Poor vision with macular degeneration.  Cardiovascular:     Rate and Rhythm: Normal rate and regular rhythm.     Heart sounds: Murmur heard.    Pulmonary:     Effort: Pulmonary effort is normal. No respiratory distress.  Abdominal:     General: Bowel sounds are normal.     Palpations: Abdomen is soft.   Musculoskeletal:        General: Normal range of motion.     Cervical back: Neck supple.     Comments: Mild puffiness of both lower legs.  Skin:    Findings: No lesion or rash.  Neurological:     Mental Status: She is alert and oriented to person, place, and time.     Comments: Difficult to evaluate due to hearing loss and fatigue.  Psychiatric:        Speech: Speech normal.        Behavior: Behavior normal.        Thought Content: Thought content normal.      No results found for any visits on 02/23/21.  Assessment & Plan     1. Chronic diastolic CHF (congestive heart failure) (HCC) Mild puffiness of lower legs and feet. Lungs clear today. Niece is concerned she had not taken her Lasix appropriately because the pill counts are off from other medications. Recent confusion may have been related to dehydration. Increase in fluid intake has helped resolve the confusion. Will check follow up labs for dehydration or electrolyte imbalances.  2. Asthma-chronic obstructive pulmonary disease overlap syndrome ([place]) Followed by Dr. Lanney Gins (pulmonologist). Continue to use Advair and Duoneb by nebulizer. Niece feels breathing is stabilized.  3. Essential hypertension Still taking Losartan and Metoprolol. Has been given some Hydralazine 10 mg to use BID if pressure is high. Need to recheck labs at [facility] facility for follow up.  4. Severe aortic stenosis Followed by cardiologist (Dr. Ubaldo Glassing).  5. Advanced age Continue follow up with [facility] at [facility]. Had some confusion and pulmonologist scheduled neurology referral. Niece worked on better hydration and confusion resolved.   No follow-ups on file.         Vernie Murders, PA-C  [employer] [phone] (phone) [phone] (fax)  [place]

## 2021-03-05 ENCOUNTER — Telehealth: Payer: Self-pay | Admitting: Physician Assistant

## 2021-03-05 ENCOUNTER — Other Ambulatory Visit: Payer: Self-pay

## 2021-03-05 ENCOUNTER — Other Ambulatory Visit: Payer: Medicare Other

## 2021-03-05 VITALS — BP 120/52 | Temp 97.5°F | Resp 24

## 2021-03-05 DIAGNOSIS — Z515 Encounter for palliative care: Secondary | ICD-10-CM

## 2021-03-05 NOTE — Telephone Encounter (Signed)
Palliative care called to request orders for patient to have a portable x-ray.  This is needed because of the fall patient had last week.  CB# [phone]

## 2021-03-05 NOTE — Telephone Encounter (Signed)
Palliative care Marilyn Oconnell) reported patient took a fall at her residence at [facility] and complains of pain in the right lower back and hip. Requested portable x-ray evaluation for possible hip and lower lumbar fractures. She was given verbal orders and will arrange films.

## 2021-03-05 NOTE — Progress Notes (Signed)
PATIENT NAME: Marilyn Oconnell DOB: 1924/01/03 MRN: [id number]  PRIMARY CARE PROVIDER: Trinna Post, PA-C  RESPONSIBLE PARTY:  Acct ID - Guarantor Home Phone Work Phone Relationship Acct Type  [id number] Cecilio [phone]  Self P/F     [address], [place], [place] [address]    PLAN OF CARE and INTERVENTIONS:               1.  GOALS OF CARE/ ADVANCE CARE PLANNING:  Remain in the facility and independent for as long as possible.               2.  PATIENT/CAREGIVER EDUCATION:  Osteoporosis risk for fractures.               4. PERSONAL EMERGENCY PLAN:  Activate 911 for emergencies.               5.  DISEASE STATUS:  Request for visit made by niece Oren Section who states patient sustained a fall last week and is having ongoing lower back pain. Visit completed with patient and private sitter, Paulette.  Patient started last with private caregivers 3x weekly.  Patient also had a room change in the last week.   Patient is initially found in the bathroom and required some assistance from her caregiver.  She ambulates with a rolling walker back to her chair.  Patient in visible pain with facial grimaces present.   Patient states the has sharp pain to her lower back and also "grabbing pain" that sometimes extends to her right groin. She is getting tylenol 2-3 x a day.  A heating pad has been brought in by family to help with pain.   Patient is started on her afternoon breathing treatment.  She is dozing off throughout the treatment.  Her caregiver notes that patient will sleep sometimes during the afternoon.  Caregiver tries to engage in patient and take her outside to sit on the porch but she is not always receptive to this.   Patient is very weary today.   75% of lunch consumed today.  Patient continues to feed herself however she does requires set up due to her poor vision.  Discussion completed with niece on obtaining an x-ray to rule out a fracture.  Niece is agreeable as long as it can  be done portable.  334 pm.  Phone call made to PCP office with update on patient fall and pain.  Request made for portable x-ray.  Palliative Care can set this up once order is obtained and results can be faxed to PCP.  531 pm. Call back received from [facility] [place].  Update provided on fall patient sustained last week.  Okay for portable x-ray spine/lumbar and pelvis.  Results will be faxed to PCP.  551 pm.  Phone call made to [facility] with above request.    HISTORY OF PRESENT ILLNESS:  87 year old female with hx of CHF and HTN.  Patient is being followed by Palliative Care monthly and PRN.  CODE STATUS: DNR ADVANCED DIRECTIVES: No MOST FORM: No PPS: 50%   PHYSICAL EXAM:   VITALS: Today's Vitals   03/05/21 1447  BP: (!) 120/52  Resp: (!) 24  Temp: (!) 97.5 F (36.4 C)  SpO2: 97%  PainSc: 10-Worst pain ever  PainLoc: Back    LUNGS: negative findings: lungs clear to auscultation CARDIAC: Cor RRR}  EXTREMITIES: - for edema SKIN: Skin color, texture, turgor normal. No rashes or lesions or  normal  NEURO: alert and oriented x 3       Lorenza Burton, RN

## 2021-03-06 ENCOUNTER — Telehealth: Payer: Self-pay

## 2021-03-06 NOTE — Telephone Encounter (Signed)
433 pm.  Return call made to niece Oren Section.  Patient continuing to show a decline in her overall condition.  Patient did not recognize niece on her arrival.  Speech was slurred and difficult to understand.  Patient was holding the bed control and making it rise and lower but not realizing what she was doing.  Stomach pain is reported off/on.  Facility NP has ordered lactulose and suppositories. Patient is having visual hallucinations-seeing deceased family members.  Increase lethargy present.  Advised that x-rays are scheduled and the technician will be there today.  Sharyn Lull has also requested blood work from facility NP.  Goals of care discussed, ED visit vs [facility] and symptom management.  Niece would prefer to keep patient in the facility and see what results from blood work and x-ray show.  Treatment options would be limited given patient's age and co-morbilities. We discussed  possible progression of patient's condition and hospice care.  Sharyn Lull would want patient to have hospice care and would like to move her to SNF for higher level of care.  She is considering [facility] as a possibility and would like to look at pursuing this sooner rather than later.  She is aware an FL-2 form would need to be completed.  Advised that I would notify our SW regarding SNF.  I will see patient in the am to follow up on her condition

## 2021-03-07 ENCOUNTER — Other Ambulatory Visit: Payer: Self-pay

## 2021-03-07 ENCOUNTER — Other Ambulatory Visit: Payer: Medicare Other

## 2021-03-07 VITALS — BP 162/58 | HR 72 | Temp 97.8°F | Resp 20

## 2021-03-07 DIAGNOSIS — Z515 Encounter for palliative care: Secondary | ICD-10-CM

## 2021-03-07 NOTE — Progress Notes (Signed)
Procedure: 72100-L-SPINE 2-3 VIEW History: Post Fall / Pain . Correlative Films Provided:  None.   FINDINGS: There is an old fracture of L3.  There is an old fracture at T11.  There is osteopenia, osteoporosis and disc space narrowing.   IMPRESSION: Old fracture of L3 and T11.   Signed By Eldridge Dace  at 03/06/2021 8:37:35 pm --------------------------------------------------------   Procedure: 72170-PELVIS 1-2 VIEW   History: Post Fall / Pain .   Correlative Films Provided:    Findings: There is no radiographic evidence of acute fracture or dislocation. The ischiopubic lines are intact. There is no subchondral lucency or sclerosis within the femoral heads to radiographically suggest avascular necrosis. Ilium, ischium and pubic bones are intact. The bony mineralization is normal. Joint spaces are preserved. Soft tissues are unremarkable.   Impression: 1. No definite radiographic evidence of acute fracture or dislocation. Specifically, bilateral femoral head and neck radiographically intact in these projections. If symptoms persist, follow-up radiographs or CT in order to evaluate for initial radiographically occult fracture.   Signed By Vira Agar  at 03/07/2021 3:41:29 am

## 2021-03-07 NOTE — Progress Notes (Signed)
PATIENT NAME: Marilyn Oconnell DOB: 09-Jul-1924 MRN: [id number]  PRIMARY CARE PROVIDER: Trinna Post, PA-C  RESPONSIBLE PARTY:  Acct ID - Guarantor Home Phone Work Phone Relationship Acct Type  [id number] Cecilio [phone]  Self P/F     [place], [place] [address]    PLAN OF CARE and INTERVENTIONS:               1.  GOALS OF CARE/ ADVANCE CARE PLANNING:  Remain in ALF for as long as possible.  Plan will be to move patient to [facility] when a higher level of care is needed.  Niece is completing financial papers for [facility].               2.  PATIENT/CAREGIVER EDUCATION:  Updated on x-ray results and bowel regimen.               4. PERSONAL EMERGENCY PLAN: Comfort care and move to SNF if a higher level of care is needed.               5.  DISEASE STATUS:  Patient is found sitting up in her recliner chair.  Staff is serving her breakfast of oatmeal, coffee and orange juice.  Patient consumes 100% of breakfast.  She notes some discomfort to her abdomen.  She has received lactulose and a suppository and bowels are moving. Abdomen slightly distended this am.  Bowel sounds present x 4 quads and no tenderness noted.  X-ray results are back and faxed to Vernie Murders, [place] for review.  Lumbar Spine shows old T3 and L11 fracture.  No acute fracture or dislocation noted to pelvis films.   Patient continues to have some discomfort to her lower back.  She has heating pad in place.   No visual hallucinations noted.  Patient is more alert and engaging this am. Blood work has been ordered and lab arrival is pending.   Updated Michelle White-niece on patient's status and x-ray results.  She will see patient later today.     HISTORY OF PRESENT ILLNESS:  87 year old female with a hx of CHF.  Patient is being followed monthly and PRN.  CODE STATUS: DNR ADVANCED DIRECTIVES: No MOST FORM: Yes PPS: 50%   PHYSICAL EXAM:   VITALS: Today's Vitals   03/07/21 0911  BP: (!) 162/58   Pulse: 72  Resp: 20  Temp: 97.8 F (36.6 C)  SpO2: 96%  PainSc: 0-No pain         Lorenza Burton, RN

## 2021-03-22 ENCOUNTER — Telehealth: Payer: Self-pay

## 2021-03-22 NOTE — Telephone Encounter (Signed)
03/22/21 @11  AM: Palliative care SW outreached patients niece, Sharyn Lull, to follow up on patients current condition.    Call unsuccessful. SW LVM. Awaiting return call.

## 2021-03-27 ENCOUNTER — Other Ambulatory Visit: Payer: Self-pay | Admitting: Family Medicine

## 2021-03-27 DIAGNOSIS — F3289 Other specified depressive episodes: Secondary | ICD-10-CM

## 2021-03-27 DIAGNOSIS — I10 Essential (primary) hypertension: Secondary | ICD-10-CM

## 2021-04-04 ENCOUNTER — Emergency Department: Payer: Medicare Other

## 2021-04-04 ENCOUNTER — Other Ambulatory Visit: Payer: Self-pay

## 2021-04-04 ENCOUNTER — Emergency Department
Admission: EM | Admit: 2021-04-04 | Discharge: 2021-04-04 | Disposition: A | Discharge status: Home / Self Care | Payer: Medicare Other | Attending: Emergency Medicine | Admitting: Emergency Medicine

## 2021-04-04 DIAGNOSIS — S0101XA Laceration without foreign body of scalp, initial encounter: Secondary | ICD-10-CM | POA: Diagnosis not present

## 2021-04-04 DIAGNOSIS — J45909 Unspecified asthma, uncomplicated: Secondary | ICD-10-CM | POA: Insufficient documentation

## 2021-04-04 DIAGNOSIS — N1832 Chronic kidney disease, stage 3b: Secondary | ICD-10-CM | POA: Insufficient documentation

## 2021-04-04 DIAGNOSIS — Z7952 Long term (current) use of systemic steroids: Secondary | ICD-10-CM | POA: Insufficient documentation

## 2021-04-04 DIAGNOSIS — I130 Hypertensive heart and chronic kidney disease with heart failure and stage 1 through stage 4 chronic kidney disease, or unspecified chronic kidney disease: Secondary | ICD-10-CM | POA: Diagnosis not present

## 2021-04-04 DIAGNOSIS — I5032 Chronic diastolic (congestive) heart failure: Secondary | ICD-10-CM | POA: Insufficient documentation

## 2021-04-04 DIAGNOSIS — Z79899 Other long term (current) drug therapy: Secondary | ICD-10-CM | POA: Diagnosis not present

## 2021-04-04 DIAGNOSIS — I2510 Atherosclerotic heart disease of native coronary artery without angina pectoris: Secondary | ICD-10-CM | POA: Diagnosis not present

## 2021-04-04 DIAGNOSIS — J449 Chronic obstructive pulmonary disease, unspecified: Secondary | ICD-10-CM | POA: Insufficient documentation

## 2021-04-04 DIAGNOSIS — S0990XA Unspecified injury of head, initial encounter: Secondary | ICD-10-CM | POA: Diagnosis present

## 2021-04-04 DIAGNOSIS — W19XXXA Unspecified fall, initial encounter: Secondary | ICD-10-CM

## 2021-04-04 DIAGNOSIS — W01198A Fall on same level from slipping, tripping and stumbling with subsequent striking against other object, initial encounter: Secondary | ICD-10-CM | POA: Diagnosis not present

## 2021-04-04 LAB — URINALYSIS, COMPLETE (UACMP) WITH MICROSCOPIC
Bacteria, UA: NONE SEEN
Bilirubin Urine: NEGATIVE
Glucose, UA: NEGATIVE mg/dL
Hgb urine dipstick: NEGATIVE
Ketones, ur: NEGATIVE mg/dL
Leukocytes,Ua: NEGATIVE
Nitrite: NEGATIVE
Protein, ur: 30 mg/dL — AB
Specific Gravity, Urine: 1.009 (ref 1.005–1.030)
pH: 7 (ref 5.0–8.0)

## 2021-04-04 IMAGING — CT CT CERVICAL SPINE W/O CM
3 series · 15 of 27 positions shown, 18 images · non-contrast
Comparison: [DATE]

CLINICAL DATA: Fall

EXAM:
CT CERVICAL SPINE WITHOUT CONTRAST
TECHNIQUE: Multidetector CT imaging of the cervical spine was performed without
intravenous contrast. Multiplanar CT image reconstructions were also
generated.

[Series 3: c spine soft · axial · 0.29mm/px · z∈[-278,-200]mm · 4 of 67 slices shown]
[im 14/67  soft-tissue]
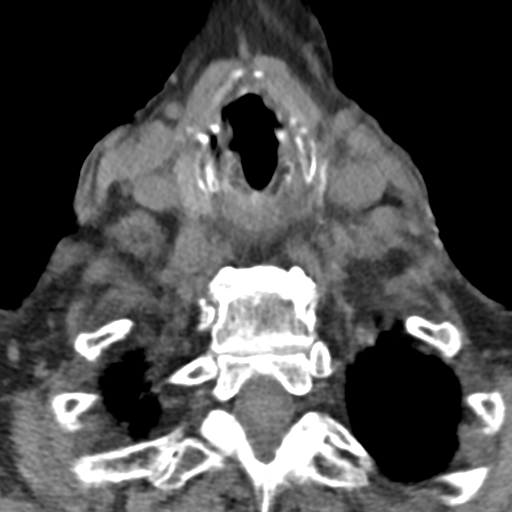
[im 27/67  soft-tissue]
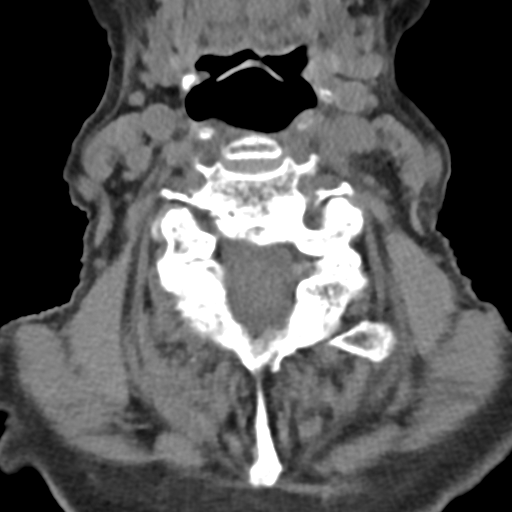
[im 40/67  soft-tissue]
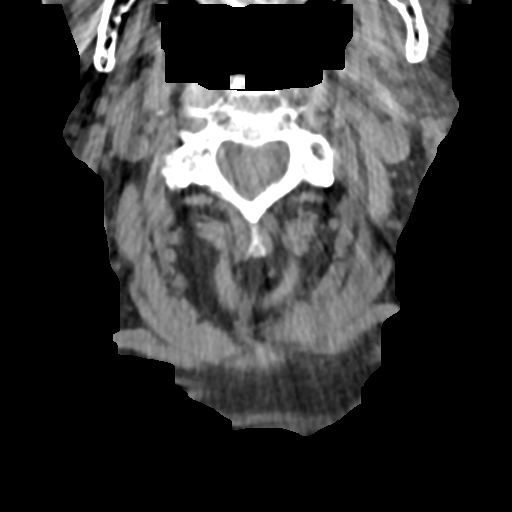
[im 53/67  soft-tissue]
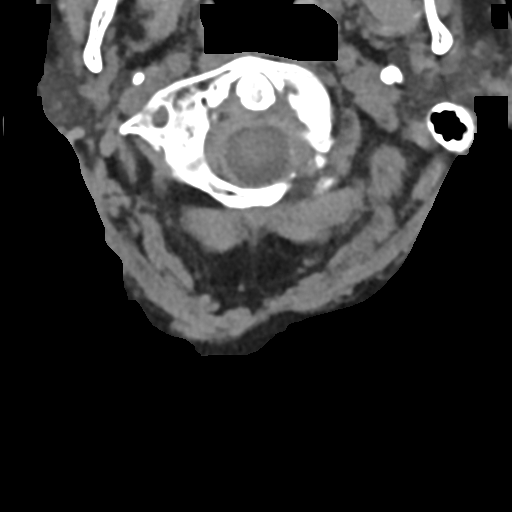

[Series 6: sagittal bone · sagittal · 0.29mm/px · 5 of 61 slices shown, 6 images]
[im 21/61  bone]
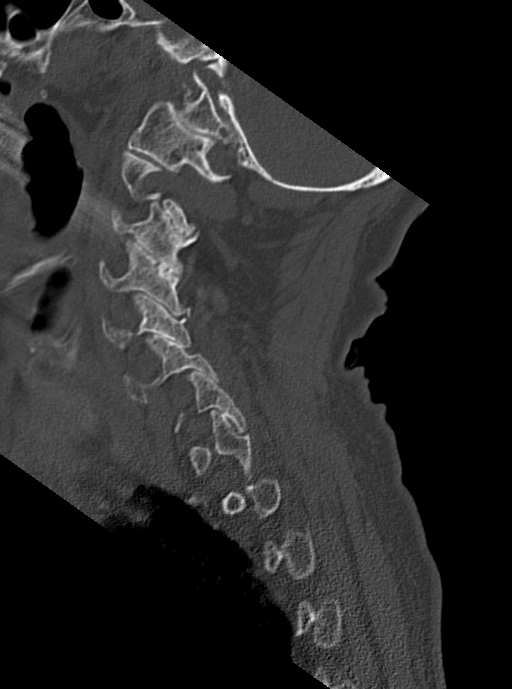
[im 26/61  bone]
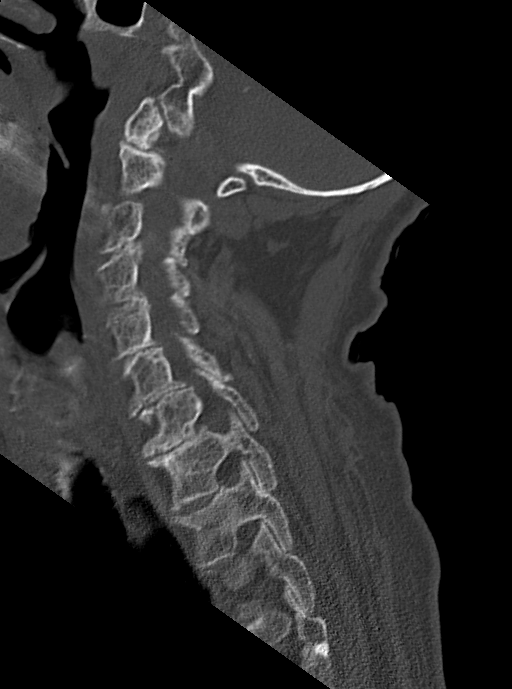
[im 31/61  soft-tissue]
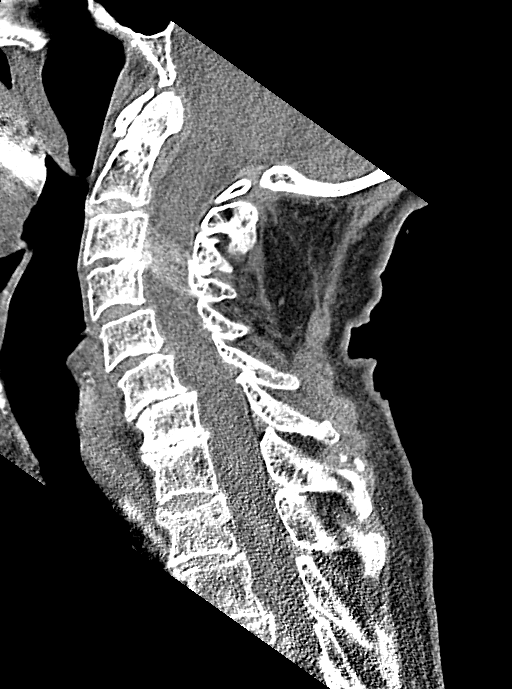
[im 31/61  bone]
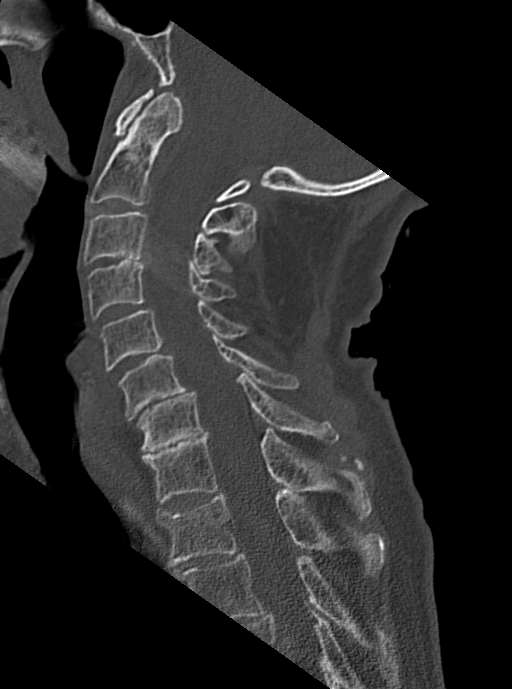
[im 36/61  bone]
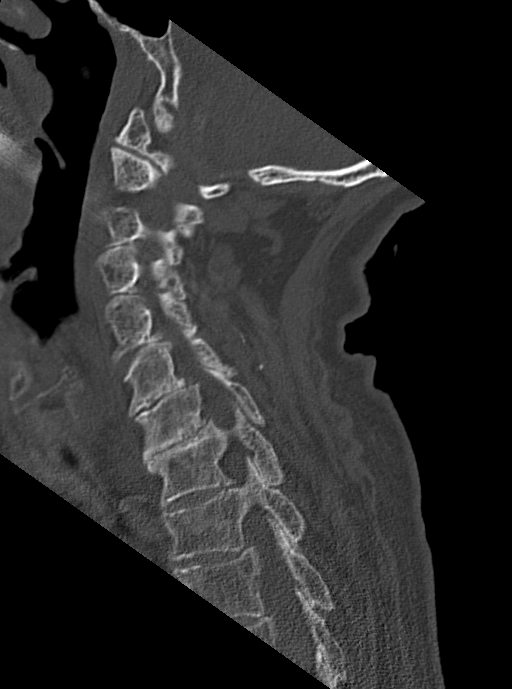
[im 41/61  bone]
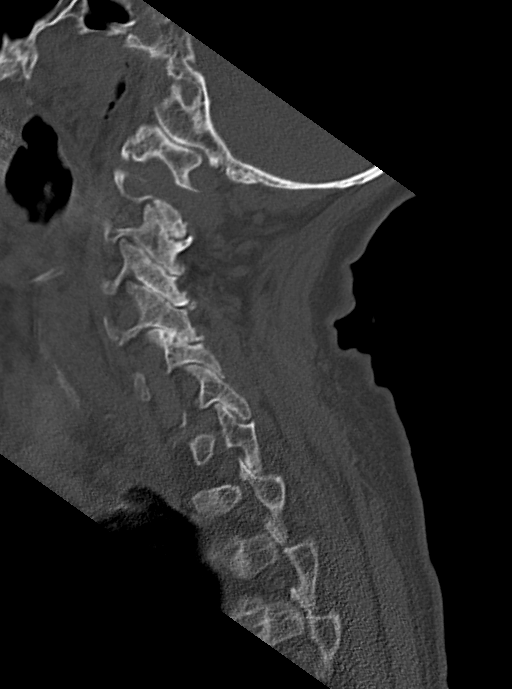

[Series 8: orthogonal bone · axial · 0.29mm/px · z∈[-335,-219]mm · 6 of 100 slices shown, 8 images]
[im 15/100  soft-tissue]
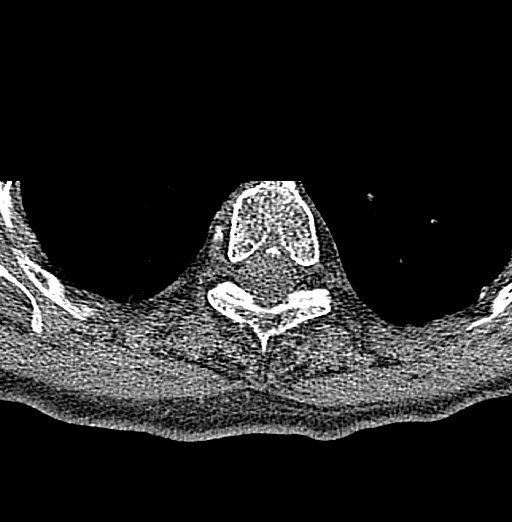
[im 15/100  bone]
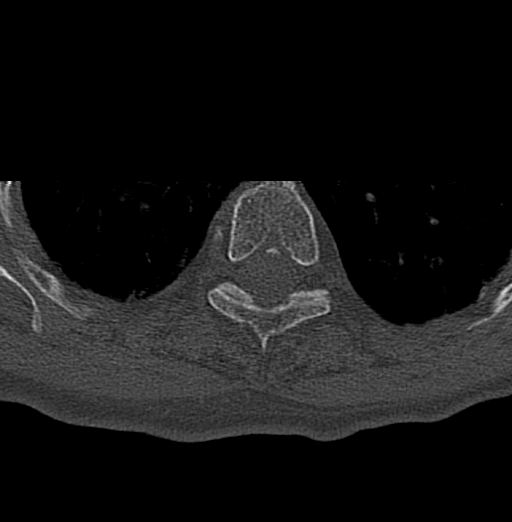
[im 29/100  bone]
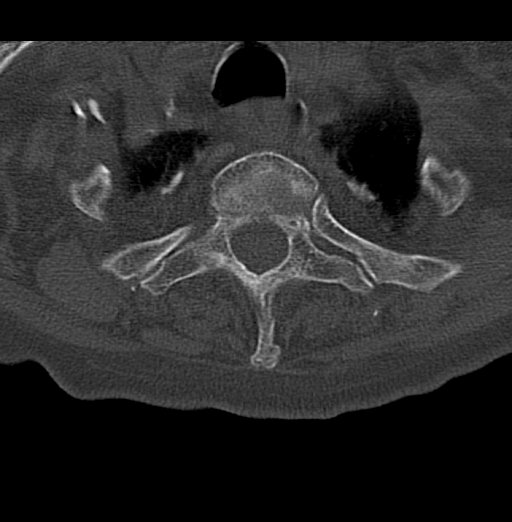
[im 43/100  bone]
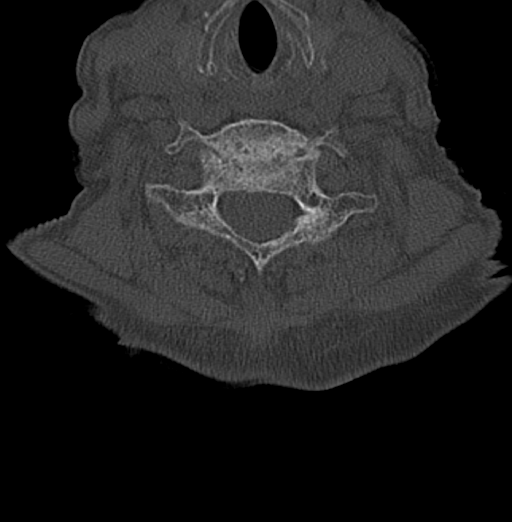
[im 57/100  bone]
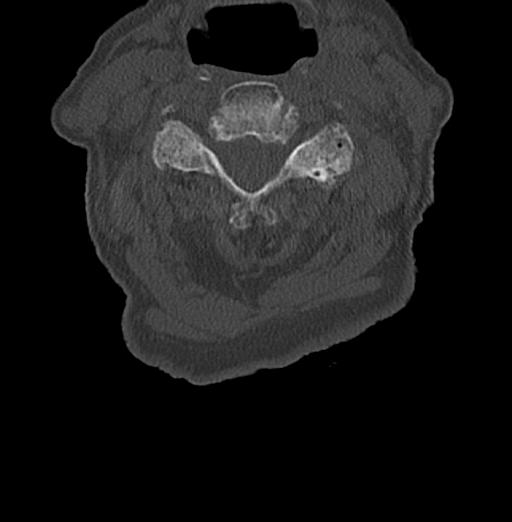
[im 71/100  soft-tissue]
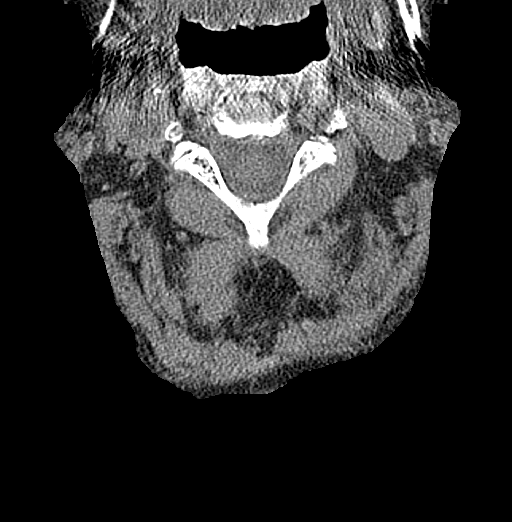
[im 71/100  bone]
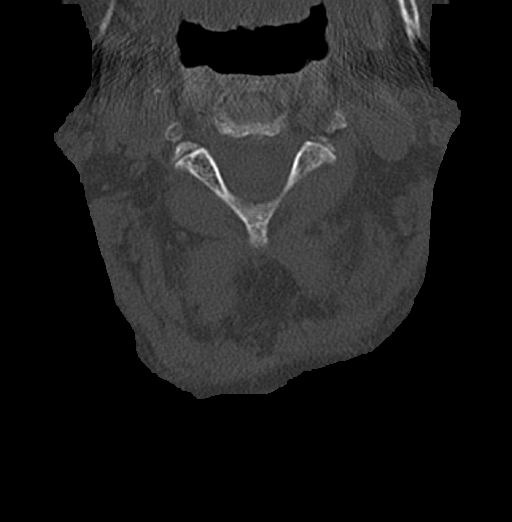
[im 85/100  bone]
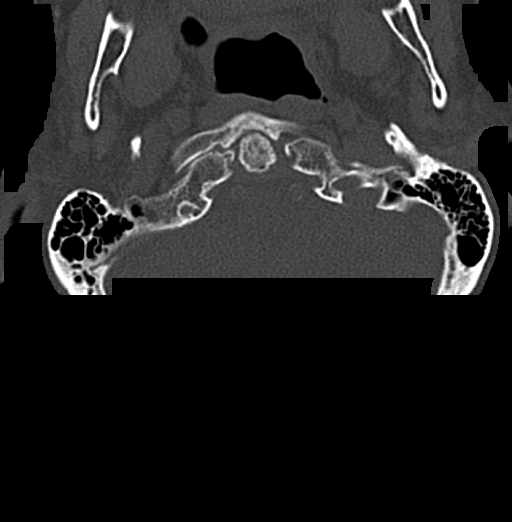

[15 of 27 positions shown; findings below may reference images not displayed]

FINDINGS: Alignment: No subluxation. Slight degenerative anterolisthesis of C6
on C7.

Skull base and vertebrae: No acute fracture. No primary bone lesion
or focal pathologic process.

Soft tissues and spinal canal: No prevertebral fluid or swelling. No
visible canal hematoma.

Disc levels:  Diffuse degenerative disc and facet disease.

Upper chest: No acute findings

Other: None
IMPRESSION: Diffuse advanced degenerative disc and facet disease. No acute bony
abnormality.

## 2021-04-04 IMAGING — CT CT HEAD W/O CM
3 series · 15 of 47 positions shown, 18 images · non-contrast
Comparison: [DATE]

CLINICAL DATA: Fall, hit back of head

EXAM:
CT HEAD WITHOUT CONTRAST
TECHNIQUE: Contiguous axial images were obtained from the base of the skull
through the vertex without intravenous contrast.

[Series 2: head wo · axial · 0.41mm/px · z∈[-169,-44]mm · 9 of 30 slices shown, 12 images]
[im 3/30  brain]
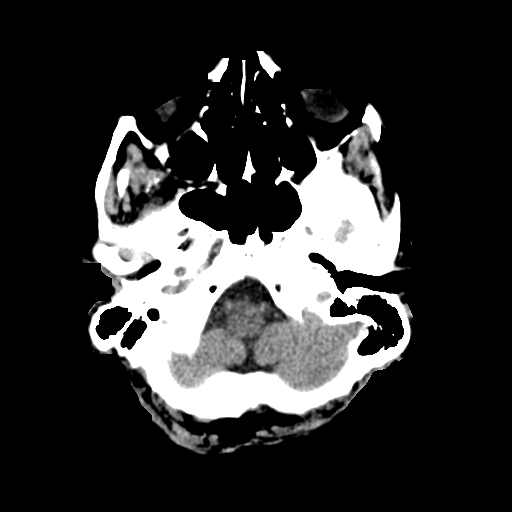
[im 3/30  bone]
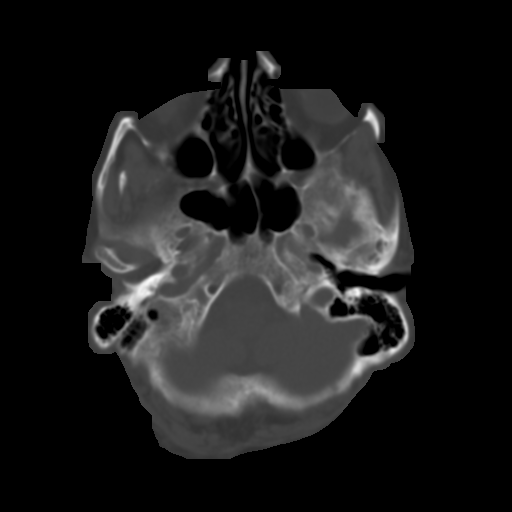
[im 6/30  brain]
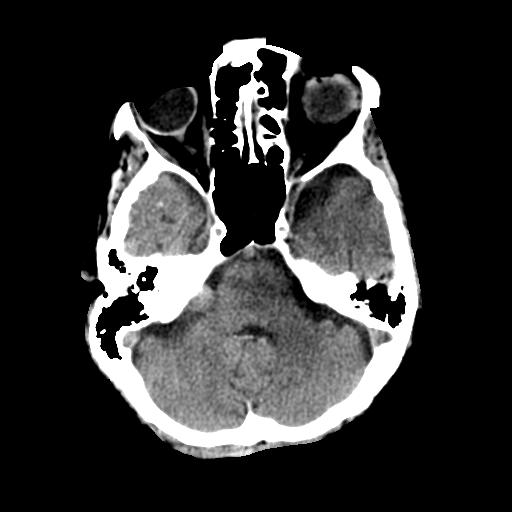
[im 9/30  brain]
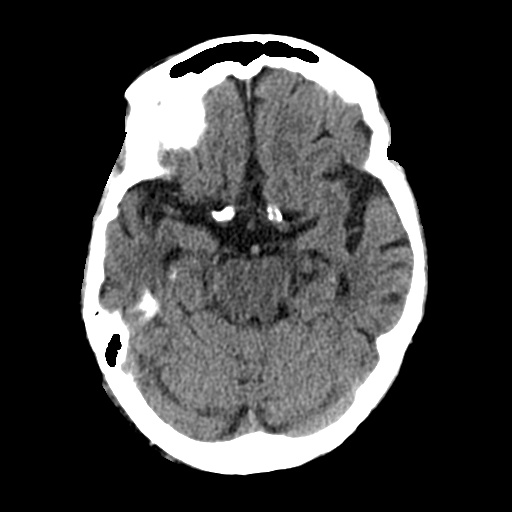
[im 12/30  brain]
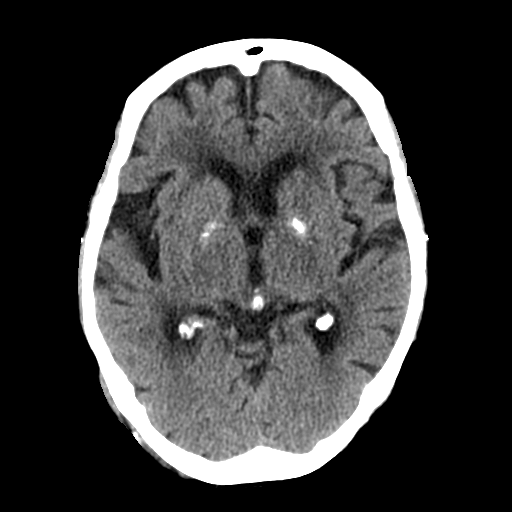
[im 16/30  brain]
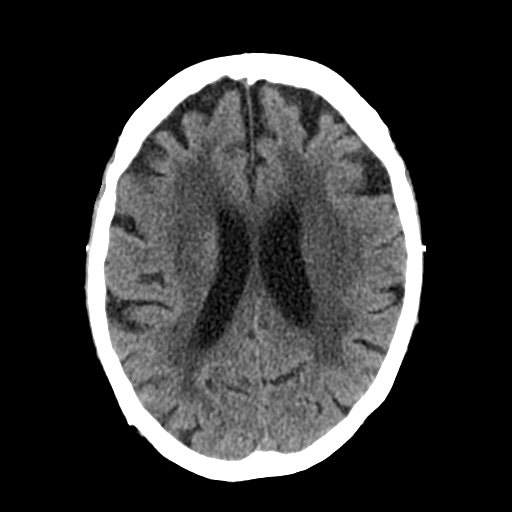
[im 16/30  bone]
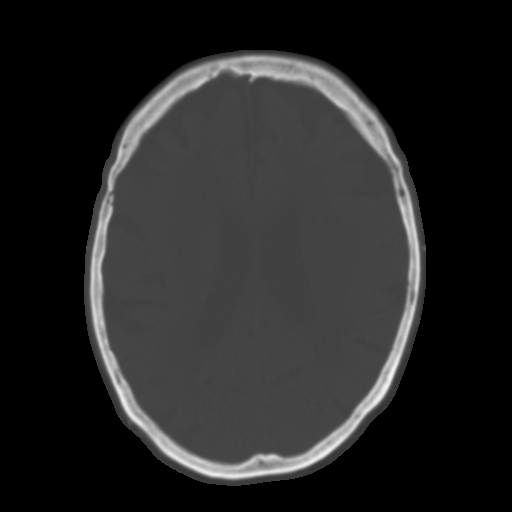
[im 19/30  brain]
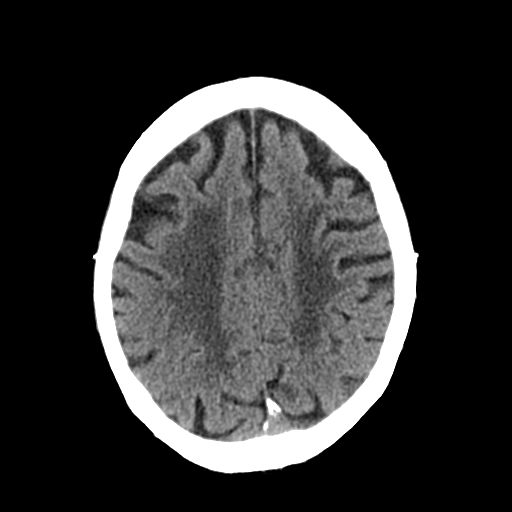
[im 22/30  brain]
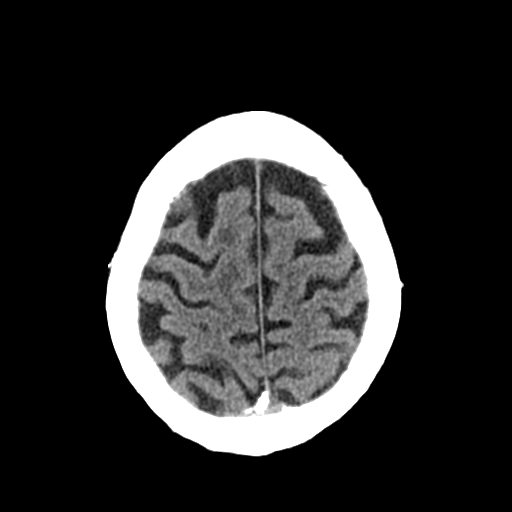
[im 25/30  brain]
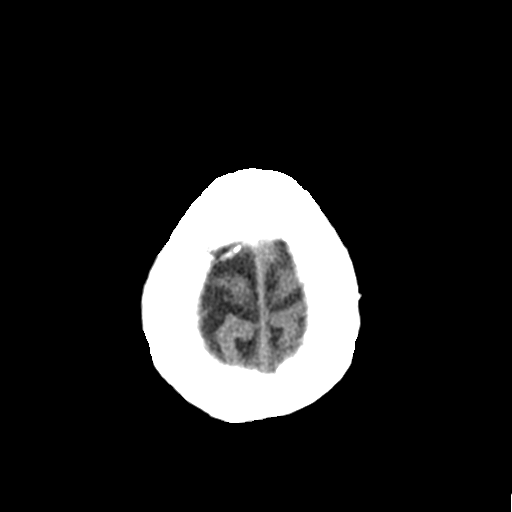
[im 28/30  brain]
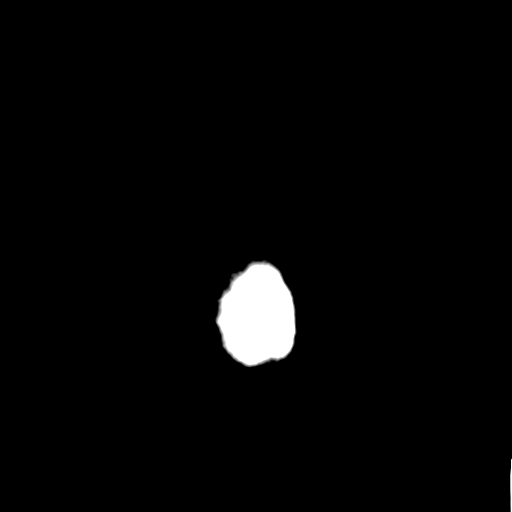
[im 28/30  bone]
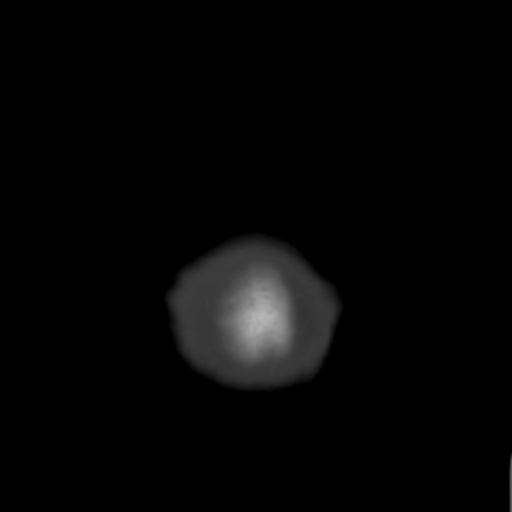

[Series 4: coronal soft tissue · coronal · 0.29mm/px · 3 of 64 slices shown]
[im 22/64  brain]
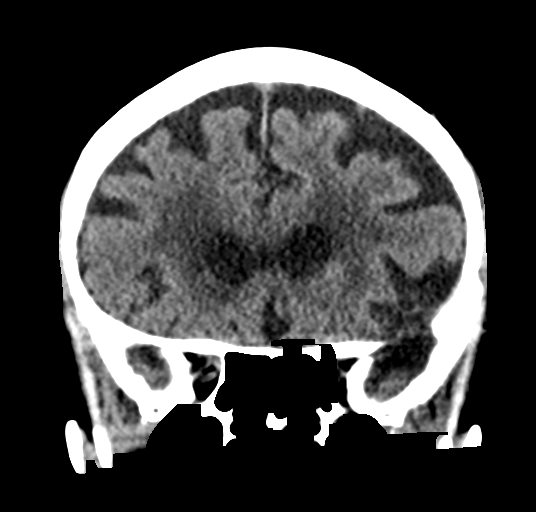
[im 29/64  brain]
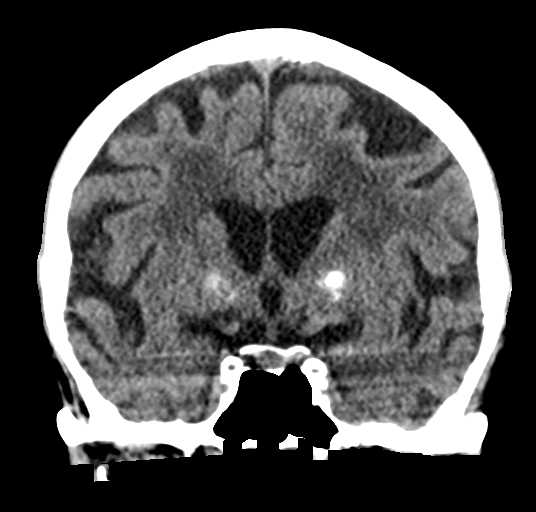
[im 36/64  brain]
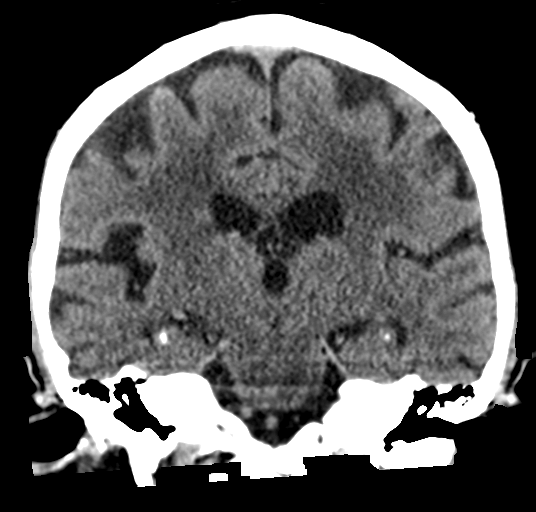

[Series 5: sagittal soft tissue · sagittal · 0.29mm/px · 3 of 52 slices shown]
[im 18/52  brain]
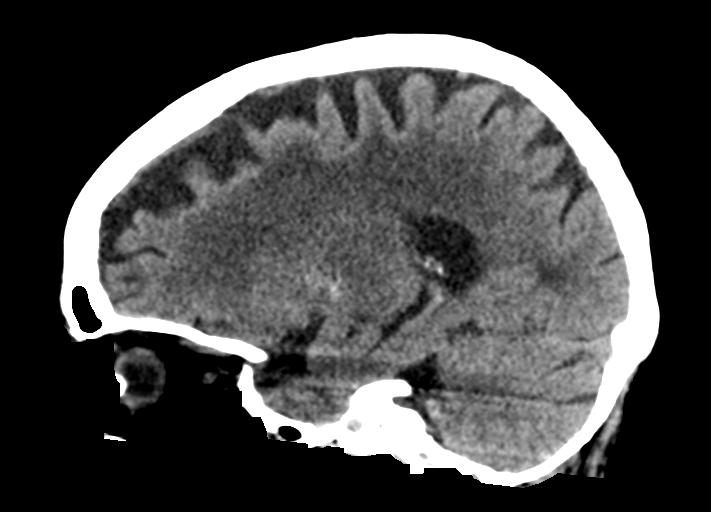
[im 26/52  brain]
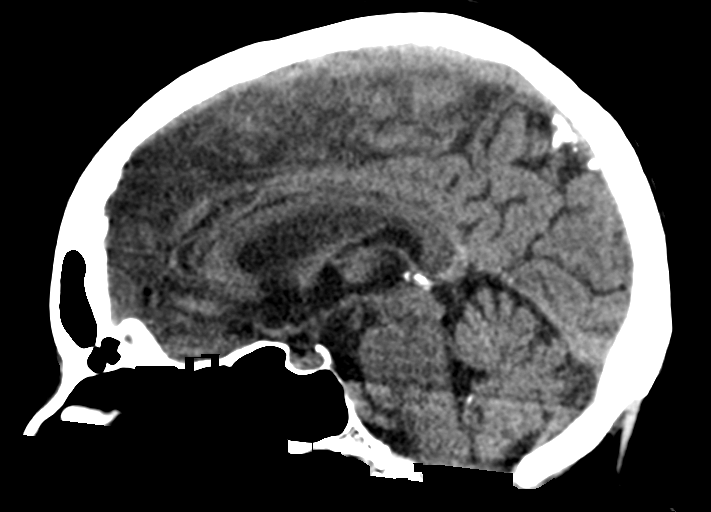
[im 35/52  brain]
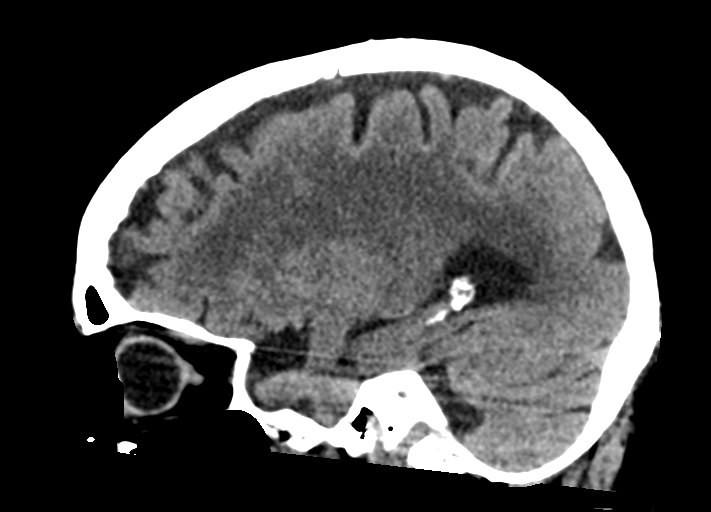

[15 of 47 positions shown; findings below may reference images not displayed]

FINDINGS: Brain: Physiologic calcifications in the basal ganglia. There is
atrophy and chronic small vessel disease changes. No acute
intracranial abnormality. Specifically, no hemorrhage,
hydrocephalus, mass lesion, acute infarction, or significant
intracranial injury.

Vascular: No hyperdense vessel or unexpected calcification.

Skull: No acute calvarial abnormality.

Sinuses/Orbits: No acute findings

Other: None
IMPRESSION: Atrophy, chronic microvascular disease.

No acute intracranial abnormality.

## 2021-04-04 MED ORDER — LOSARTAN POTASSIUM 25 MG PO TABS
12.5000 mg | ORAL_TABLET | Freq: Once | ORAL | Status: AC
  Administered 2021-04-04: 12.5 mg via ORAL
  Filled 2021-04-04: qty 0.5

## 2021-04-04 MED ORDER — ALBUTEROL SULFATE (2.5 MG/3ML) 0.083% IN NEBU
2.5000 mg | INHALATION_SOLUTION | Freq: Once | RESPIRATORY_TRACT | Status: AC
  Administered 2021-04-04: 2.5 mg via RESPIRATORY_TRACT
  Filled 2021-04-04: qty 3

## 2021-04-04 MED ORDER — HYDRALAZINE HCL 10 MG PO TABS
10.0000 mg | ORAL_TABLET | Freq: Once | ORAL | Status: AC
  Administered 2021-04-04: 10 mg via ORAL
  Filled 2021-04-04: qty 1

## 2021-04-04 MED ORDER — METOPROLOL TARTRATE 50 MG PO TABS
50.0000 mg | ORAL_TABLET | Freq: Once | ORAL | Status: AC
  Administered 2021-04-04: 50 mg via ORAL
  Filled 2021-04-04: qty 1

## 2021-04-04 MED ORDER — ACETAMINOPHEN 325 MG PO TABS
650.0000 mg | ORAL_TABLET | Freq: Once | ORAL | Status: AC
  Administered 2021-04-04: 650 mg via ORAL
  Filled 2021-04-04: qty 2

## 2021-04-04 NOTE — Discharge Instructions (Addendum)
Please have staples removed in 5 to 7 days.  You may take Tylenol at your facility per standing orders.  Return to the emergency department if you experience any changes or worsening in symptoms.  Continue your home medications as previously prescribed.

## 2021-04-04 NOTE — ED Triage Notes (Signed)
Pt come with c/o fall this am. Pt comes via EMS from [facility] after mechanical fall. Pt states she was reaching for tissue and went to turn and fell. Pt has dried blood noted to back of head. No LOC or blood thinners.

## 2021-04-04 NOTE — ED Provider Notes (Signed)
[facility] Emergency Department Provider Note  ____________________________________________   Event Date/Time   First MD Initiated Contact with Patient 04/04/21 1002     (approximate)  I have reviewed the triage vital signs and the nursing notes.   HISTORY  Chief Complaint Fall  HPI Marilyn Oconnell is a 87 y.o. female who presents to the emergency department after a mechanical fall at her living facility this morning.  Reportedly the patient had been delivered breakfast, was attempting to eat this on her tray on her walker, got up to get a napkin and attempted to do so without the use of her walker and suffered a mechanical fall.  Patient laid on the floor for approximately 20 minutes until she was found by nursing staff.  This time is confirmed given that she was delivered breakfast approximately 20 minutes prior to being found.  She hit the back of her head on her bed frame, denies any loss of consciousness though this was unwitnessed.  She denies any pain other than in the back of her head.         Past Medical History:  Diagnosis Date   Arthritis    CHF (congestive heart failure) (HCC)    COPD (chronic obstructive pulmonary disease) (HCC)    Coronary artery disease    Hypercholesteremia    Hypertension     Patient Active Problem List   Diagnosis Date Noted   Fall 06/30/2020   Hypertension    Coronary artery disease    COPD (chronic obstructive pulmonary disease) (HCC)    Hypertensive urgency    Elevated troponin    PAF (paroxysmal atrial fibrillation) (HCC)    Chronic diastolic CHF (congestive heart failure) ([place])    Current mild episode of major depressive disorder, unspecified whether recurrent ([place]) 04/25/2020   Advanced age 61/09/2019   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   Blindness 03/23/2020   Atrial fibrillation with RVR (HCC) 03/22/2020   Chronic pain syndrome 03/16/2020   Closed nondisplaced fracture of surgical neck of left  humerus 03/16/2020   Chronic left-sided headaches 03/16/2020   Occipital neuralgia of left side 03/16/2020   Syncope 07/20/2019   Recurrent syncope 07/19/2019   Insomnia 03/18/2016   Visual hallucinations 03/18/2016   Vertigo 03/15/2016   Paroxysmal atrial fibrillation with RVR ([place]) 11/27/2015   Depression 11/27/2015   Adjustment disorder with disturbance of emotion 09/28/2015   Severe aortic stenosis 09/28/2015   Bilateral cataracts 09/28/2015   Cardiac murmur 09/28/2015   Acute renal failure superimposed on stage 3b chronic kidney disease ([place]) 09/28/2015   History of gout 09/28/2015   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/2015   OP (osteoporosis) 09/28/2015   Atherosclerosis of coronary artery 09/08/2015   Avitaminosis D 07/11/2015   LBP (low back pain) 07/11/2015   BP (high blood pressure) 07/11/2015   HLD (hyperlipidemia) 04/12/2015   Billowing mitral valve 08/03/2014   Fibrosis of lung ([place]) 05/25/2014   Asthma-chronic obstructive pulmonary disease overlap syndrome ([place]) 05/25/2014   H/O aortic valve replacement 11/09/2004    Past Surgical History:  Procedure Laterality Date   AORTIC VALVE REPLACEMENT  2006   DILATION AND CURETTAGE OF UTERUS  1988   SHOULDER SURGERY  09/1999   SIGMOIDOSCOPY  1992    Prior to Admission medications   Medication Sig Start Date End Date Taking? Authorizing Provider  acetaminophen (TYLENOL) 500 MG tablet Take 500-1,000 mg by mouth 2 (two) times daily as needed for mild pain, moderate pain, fever or headache.  [provider]  albuterol (PROVENTIL) (2.5 MG/3ML) 0.083% nebulizer solution Take 2.5 mg by nebulization every 8 (eight) hours as needed for wheezing or shortness of breath.     [provider]  budesonide (PULMICORT) 0.5 MG/2ML nebulizer solution Take 0.5 mg by nebulization at bedtime.    [provider]  diclofenac Sodium (VOLTAREN) 1 % GEL Apply topically as needed.  03/29/20   [provider]   escitalopram (LEXAPRO) 20 MG tablet Take 1 tablet (20 mg total) by mouth daily. 03/28/21   Birdie Sons, MD  fexofenadine (ALLEGRA) 180 MG tablet Take 180 mg by mouth daily.    [provider]  Fluticasone-Salmeterol (ADVAIR) 250-50 MCG/DOSE AEPB INHALE ONE PUFF INTO LUNGS EVERY 12 HOURS. RINSE MOUTH AFTER USE 12/20/20   Trinna Post, PA-C  furosemide (LASIX) 20 MG tablet Take 1 tablet (20 mg total) by mouth daily. 02/13/21   Chrismon, Vickki Muff, PA-C  hydrALAZINE (APRESOLINE) 10 MG tablet Take 1 tablet (10 mg total) by mouth 2 (two) times daily. 01/16/21 04/16/21  Virginia Crews, MD  ipratropium-albuterol (DUONEB) 0.5-2.5 (3) MG/3ML SOLN Take 3 mLs by nebulization 3 (three) times daily. Patient taking differently: Take 3 mLs by nebulization in the morning and at bedtime. 03/27/20   Swayze, Ava, DO  losartan (COZAAR) 25 MG tablet Take 0.5 tablets (12.5 mg total) by mouth daily. 03/28/21   Birdie Sons, MD  metoprolol succinate (TOPROL-XL) 50 MG 24 hr tablet TAKE 1 TABLET BY MOUTH  DAILY WITH OR IMMEDIATELY  FOLLOWING A MEAL 07/05/20   Carles Collet M, PA-C    Allergies Iodine, Shellfish allergy, Azithromycin, and Sulfa antibiotics  Family History  Problem Relation Age of Onset   Aneurysm Father    Lung cancer Brother    Prostate cancer Brother    Heart failure Brother     Social History Social History   Tobacco Use   Smoking status: Never   Smokeless tobacco: Never  Substance Use Topics   Alcohol use: No   Drug use: No    Review of Systems Constitutional: No fever/chills Eyes: No visual changes. ENT: No sore throat. Cardiovascular: Denies chest pain. Respiratory: Denies shortness of breath. Gastrointestinal: No abdominal pain.  No nausea, no vomiting.  No diarrhea.  No constipation. Genitourinary: Negative for dysuria. Musculoskeletal: Negative for back pain. Skin: + Head laceration, negative for rash. Neurological: + headaches, negative for focal  weakness or numbness.   ____________________________________________   PHYSICAL EXAM:  VITAL SIGNS: ED Triage Vitals  Enc Vitals Group     BP 04/04/21 0941 (!) 222/69     Pulse Rate 04/04/21 0941 65     Resp 04/04/21 0941 18     Temp 04/04/21 0941 98 F (36.7 C)     Temp src --      SpO2 04/04/21 0941 96 %     Weight --      Height --      Head Circumference --      Peak Flow --      Pain Score 04/04/21 0939 2     Pain Loc --      Pain Edu? --      Excl. in [place]? --    Constitutional: Alert and oriented.  Well appearing for age, answering questions appropriately. Eyes: Conjunctivae are normal. PERRL. EOMI. Head: There is a 1 cm laceration on the right posterior occiput. Nose: No congestion/rhinnorhea. Mouth/Throat: Mucous membranes are moist.  Oropharynx non-erythematous. Neck: No stridor.  No tenderness to the midline or paraspinals of the cervical spine. Cardiovascular: Normal rate, regular rhythm. Grossly normal heart sounds.  Good peripheral circulation. Respiratory: Normal respiratory effort.  No retractions. Lungs CTAB. Gastrointestinal: Soft and nontender. No distention. No abdominal bruits. No CVA tenderness. Musculoskeletal: Negative logroll of the hips bilaterally, negative pelvic squeeze.  Range of motion of the lower extremities appropriate. Neurologic:  Normal speech and language. No gross focal neurologic deficits are appreciated.  Skin:  Skin is warm, dry and intact except as described above. No rash noted. Psychiatric: Mood and affect are normal. Speech and behavior are normal.  ____________________________________________   LABS (all labs ordered are listed, but only abnormal results are displayed)  Labs Reviewed  URINALYSIS, COMPLETE (UACMP) WITH MICROSCOPIC - Abnormal; Notable for the following components:      Result Value   Color, Urine STRAW (*)    APPearance CLEAR (*)    Protein, ur 30 (*)    All other components within normal limits    ____________________________________________  RADIOLOGY  Official radiology report(s): CT Head Wo Contrast  Result Date: 04/04/2021 CLINICAL DATA:  Fall, hit back of head EXAM: CT HEAD WITHOUT CONTRAST TECHNIQUE: Contiguous axial images were obtained from the base of the skull through the vertex without intravenous contrast. COMPARISON:  06/29/2020 FINDINGS: Brain: Physiologic calcifications in the basal ganglia. There is atrophy and chronic small vessel disease changes. No acute intracranial abnormality. Specifically, no hemorrhage, hydrocephalus, mass lesion, acute infarction, or significant intracranial injury. Vascular: No hyperdense vessel or unexpected calcification. Skull: No acute calvarial abnormality. Sinuses/Orbits: No acute findings Other: None IMPRESSION: Atrophy, chronic microvascular disease. No acute intracranial abnormality. Electronically Signed   By: Rolm Baptise M.D.   On: 04/04/2021 10:53   CT Cervical Spine Wo Contrast  Result Date: 04/04/2021 CLINICAL DATA:  Fall EXAM: CT CERVICAL SPINE WITHOUT CONTRAST TECHNIQUE: Multidetector CT imaging of the cervical spine was performed without intravenous contrast. Multiplanar CT image reconstructions were also generated. COMPARISON:  06/29/2020 FINDINGS: Alignment: No subluxation. Slight degenerative anterolisthesis of C6 on C7. Skull base and vertebrae: No acute fracture. No primary bone lesion or focal pathologic process. Soft tissues and spinal canal: No prevertebral fluid or swelling. No visible canal hematoma. Disc levels:  Diffuse degenerative disc and facet disease. Upper chest: No acute findings Other: None IMPRESSION: Diffuse advanced degenerative disc and facet disease. No acute bony abnormality. Electronically Signed   By: Rolm Baptise M.D.   On: 04/04/2021 10:55    ____________________________________________   PROCEDURES  Procedure(s) performed (including Critical Care):  Marland KitchenMarland KitchenLaceration Repair  Date/Time: 04/04/2021  4:00 PM Performed by: Marlana Salvage, PA Authorized by: Marlana Salvage, PA   Consent:    Consent obtained:  Verbal   Consent given by:  Patient   Risks, benefits, and alternatives were discussed: yes     Risks discussed:  Infection, pain and need for additional repair   Alternatives discussed:  No treatment and delayed treatment Universal protocol:    Procedure explained and questions answered to patient or proxy's satisfaction: yes     Imaging studies available: yes     Patient identity confirmed:  Verbally with patient Anesthesia:    Anesthesia method:  None Laceration details:    Location:  Scalp   Scalp location:  Occipital   Length (cm):  1 Treatment:    Area cleansed with:  Povidone-iodine and saline Skin repair:    Repair method:  Staples   Number of staples:  2 Approximation:  Approximation:  Close Repair type:    Repair type:  Simple Post-procedure details:    Dressing:  Open (no dressing)   Procedure completion:  Tolerated well, no immediate complications   ____________________________________________   INITIAL IMPRESSION / ASSESSMENT AND PLAN / ED COURSE  As part of my medical decision making, I reviewed the following data within the [facility] notes reviewed and incorporated and Notes from prior ED visits        Patient is a 87 year old female who presents to the emergency department for evaluation after reported mechanical fall at her facility this morning.  She presents with family member who helps to provide additional history.  See HPI for further details.    In triage, patient is quite hypertensive with a pressure of 220/69, however other vitals are within normal limits.  She reportedly had not received any of her morning pressure medications and has a known history of hypertension.  She will be administered her doses of home medications.  She also received a breathing treatment, consistent with her normal dosing of  albuterol 3 times daily.  Overall, her exam is grossly reassuring with appropriate movement of all 4 extremities.  She does have a head laceration.  CT was obtained of the head and neck and is negative for any acute pathology.  Urinalysis negative.  She did not have any preceding symptoms and reports this is a mechanical fall, and is remarkably alert and oriented given her age.  Laceration was repaired with 2 staples, recommended having these removed in 5 to 7 days.  Blood pressure did improve after given home medication dosing.  Patient and family are amenable with plan, she stable this time for outpatient follow-up.      ____________________________________________   FINAL CLINICAL IMPRESSION(S) / ED DIAGNOSES  Final diagnoses:  Laceration of scalp without foreign body, initial encounter  Fall, initial encounter     ED Discharge Orders     None        Note:  This document was prepared using Dragon voice recognition software and may include unintentional dictation errors.    Marlana Salvage, PA 04/04/21 1603    Nena Polio, MD 04/04/21 [phone]

## 2021-04-05 ENCOUNTER — Telehealth: Payer: Self-pay

## 2021-04-05 ENCOUNTER — Non-Acute Institutional Stay: Payer: Commercial Managed Care - HMO

## 2021-04-05 VITALS — BP 170/72 | HR 59 | Temp 97.2°F | Resp 22

## 2021-04-05 NOTE — Telephone Encounter (Signed)
102 pm.  Message received from [facility] stating patient was unable to walk due to right hip pain which started yesterday.  Patient sustained a fall yesterday and was seen in the ED.  Head CT and cervical spine was completed.  Phone call made to the facility to inquire about a right hip x-ray.  Spoke with Lattie Haw who states that patient has orders for a right hip x-ray that were sent to [facility].  I contacted [facility] and they do not have orders for a right hip x-ray.  Facility called back but unable to reach [facility].  Message has been left.  I have also contacted [facility] but they do have orders for patient.  Notified Sharyn Lull of above and she provided the [facility] # Lorraine Lax.  I have contacted her and message has been left with above.  Call back number provided should additional assistance be needed.  Michelle-niece updated on above.

## 2021-04-06 ENCOUNTER — Other Ambulatory Visit: Payer: Self-pay

## 2021-04-06 ENCOUNTER — Non-Acute Institutional Stay: Payer: Medicare Other

## 2021-04-06 DIAGNOSIS — Z515 Encounter for palliative care: Secondary | ICD-10-CM

## 2021-04-06 NOTE — Progress Notes (Signed)
PATIENT NAME: Marilyn Oconnell DOB: 1923-11-22 MRN: [id number]  PRIMARY CARE PROVIDER: Margo Common, PA-C  RESPONSIBLE PARTY:  Acct ID - Guarantor Home Phone Work Phone Relationship Acct Type  [id number] Cecilio [phone]  Self P/F     [place], [place] [address]    PLAN OF CARE and INTERVENTIONS:               1.  GOALS OF CARE/ ADVANCE CARE PLANNING:  Transfer to SNF when appropriate.               2.  PATIENT/CAREGIVER EDUCATION:                 4. PERSONAL EMERGENCY PLAN:  Activate 911 for emergencies.               5.  DISEASE STATUS: Visit completed with patient and family member.  [facility] is present obtaining a hip xray due to pain after a fall.  Results should be in today and technician has multiple emails to sent results too. Patient is found sitting on the edge of her bed.  She denies pain at present but notes that she is unable to bear weight to the right leg due to pain.  No shortening or inverted foot present to right extremity. Patient has 2 staples present to posterior head.  This will be removed on Monday.  Patient states her fall occurred as a result of not having her walker with her.  She fell backwards hitting her head and sustained a laceration.  Patient states she is going to ask staff for assistance due to decreased mobility.  Hopefully a wheelchair will be utilized for now.   Notified niece of x-ray being completed with pending results.   HISTORY OF PRESENT ILLNESS:  87 year old female with CAD, COPD and CHF.  Patient is being followed by Palliative Care monthly and PRN.  CODE STATUS: DNR ADVANCED DIRECTIVES: No MOST FORM: No PPS: 30%   PHYSICAL EXAM:   VITALS: Today's Vitals   04/06/21 0933  BP: (!) 170/72  Pulse: (!) 59  Resp: (!) 22  Temp: (!) 97.2 F (36.2 C)  SpO2: 96%    LUNGS: decreased breath sounds CARDIAC: Cor RRR}  EXTREMITIES: - for edema SKIN: Skin color, texture, turgor normal. No rashes or lesions  or normal  NEURO: positive for gait problems       Lorenza Burton, RN

## 2021-04-09 ENCOUNTER — Other Ambulatory Visit: Payer: Self-pay

## 2021-04-09 ENCOUNTER — Other Ambulatory Visit: Payer: Medicare Other

## 2021-04-09 DIAGNOSIS — Z515 Encounter for palliative care: Secondary | ICD-10-CM

## 2021-04-09 NOTE — Progress Notes (Signed)
1115 am.  Arrived to find patient sitting on the edge of her bed completing her breathing treatment.  Patient states she is feeling fine today.  Denies any pain while sitting.  Patient states she is walking very short distances with her walker.  Mostly to the bathroom.  She does note some pain with this activity.  Currently on Mobic 2x daily and bio-freeze to address pain.  2 stapes present to posterior head and removal ordered at 5- 7 days.  Explained procedure to patient and 2 staples removed without issues.  Laceration has healed an no treatment is needed to site.  Patient denies any needs or concerns at this time.  Advised that PC will follow up again later this week.

## 2021-04-10 NOTE — Progress Notes (Signed)
Follow up visit completed to assess patient status and participate in a [place] meeting.  Patient is found sitting in her chair.  She currently has a transport chair that staff is using.  Patient denies any pain at rest.  Only displays pain with movement or weigh-bearing to her right leg.  Patient currently has tylenol bid scheduled.  Joint meeting completed with Wilson Singer and Jonelle Sidle, facility RN.  Niece explained she did not want to proceed with a CT scan.  Patient would not qualify for surgery given her complex medical issues.  Dx would not change the goals of care for her aunt.  The goal is for patient to remain comfortable and have the best quality of life as she can.  Sharyn Lull has requested mobic to be started and continue with tylenol routinely.   Discussed increasing assistance with ADL's and also frequent checks on patient.  Facility RN has ordered a pendulum alarm for patient to use.  She will connect this to the facility system and provide instruction to patient once this arrives.  Facility RN states patient may remain at the facility until she needs a higher level of care.  No other concerns voiced at this time.  Care plan meeting is scheduled for 2 weeks to reassess patient status and possible need for SNF.  I will see patient again on Monday to remove 2 staples on the posterior head.        Results of Hip X-ray:  Hip Bilateral 5V total (including pelvis):   Five views of both hips include the pelvis. No prior studies. Findings: Moderate osteoporosis, for age. On right hip frogleg oblique view image 5, there is a longitudinal linear lucency through the femoral neck at its approximate midportion. While this is likely trabecular artifact, it is an unusual finding and possibly is unusual presentation of an acute or subacute linear fracture of the femoral neck. Otherwise, the right femur shows no evidence of fracture or dislocation or destructive process. For age, the right hip  joint space is preserved. Moderate vascular calcified plaque is present on both sides. Both hips show mild lateral acetabular marginal spurring. The left femur shows no acute fracture or dislocation or destructive process. For age, the left hip joint space is preserved. The pelvis shows no acute fracture or destructive bone lesion. Unremarkable appearance of SI joints, for age. Small amount of rectal intraluminal contrast material versus pelvic calcifications are noted. This finding is not of concern.   IMPRESSION:   Demineralization and degenerative changes. Unremarkable appearance of left hip for acute process. Unusual linear lucency in the right femoral neck, as above. It is likely this is due to trabecular marking., though unusual acute or subacute linear fracture of the right femoral neck cannot be excluded. No acute osseous abnormalities are seen in the pelvis itself. Clinical correlation recommended. Consider further assessment with right hip CT/MRI, as clinically appropriate

## 2021-04-12 ENCOUNTER — Non-Acute Institutional Stay: Payer: Medicare Other

## 2021-04-12 ENCOUNTER — Other Ambulatory Visit: Payer: Self-pay | Admitting: Family Medicine

## 2021-04-12 ENCOUNTER — Other Ambulatory Visit: Payer: Self-pay

## 2021-04-12 VITALS — BP 152/58 | HR 68 | Temp 97.5°F | Resp 24

## 2021-04-12 DIAGNOSIS — Z515 Encounter for palliative care: Secondary | ICD-10-CM

## 2021-04-12 DIAGNOSIS — I10 Essential (primary) hypertension: Secondary | ICD-10-CM

## 2021-04-12 NOTE — Progress Notes (Signed)
PATIENT NAME: Marilyn Oconnell DOB: March 07, 1924 MRN: [id number]  PRIMARY CARE PROVIDER: Margo Common, PA-C  RESPONSIBLE PARTY:  Acct ID - Guarantor Home Phone Work Phone Relationship Acct Type  [id number] Cecilio [phone]  Self P/F     [address], [place], [place] [address]    PLAN OF CARE and INTERVENTIONS:               1.  GOALS OF CARE/ ADVANCE CARE PLANNING:  Remain in the facility and comfortable.               2.  PATIENT/CAREGIVER EDUCATION:  Safety               4. PERSONAL EMERGENCY PLAN:  Activate 911 for emergencies.                5.  DISEASE STATUS:  Patient found in her bed awake with the covers pulled back.  She has spilled her drink and is trying to get out of the bed.  Patient has not pulled her call light.  I have encouraged patient to pull her call light for assistance.   Patient states she does not think the call light is working.  I had patient pull her call light to ensure it was working.  Staff did come to the room to attend to patient.    Confusion noted with patient.  She believes it is night time and believes she is waiting for her breakfast.  Re-oriented patient to time and also opened her blinds.  Pivot transfer completed with patient to her transport chair.  She is taken to the bathroom and voids with a small bm.  Patient is independent with toileting on this visit.   Sheets changed on patient's bed due to spilled liquids.  Patient returns to her chair for dinner.    Patient continues to report pain to her right hip joint when pressure is applied from standing or pivoting.  At rest, patient denies any pain.   Spoke with Leah, LPN and she states patient has been found standing up in her room unassisted.  I have spoken with patient about the importance of using her call light to maintain her safety.    Phone call made to [facility] and update provided on my visit.  Active listening provided as niece discussed complex care of patient and  recent decline.  Careplan meeting is already scheduled for next Tuesday to re-evaluate patient's overall condition and needs.  HISTORY OF PRESENT ILLNESS:  87 year old female with CHF and COPD.  Patient is being followed by Palliative Care monthly and PRN.  CODE STATUS: DNR ADVANCED DIRECTIVES: No MOST FORM: No PPS: 40%   PHYSICAL EXAM:   VITALS: Today's Vitals   04/12/21 1617  BP: (!) 152/58  Pulse: 68  Resp: (!) 24  Temp: (!) 97.5 F (36.4 C)  SpO2: 95%  PainSc: 0-No pain    LUNGS: decreased breath sounds CARDIAC: Cor RRR} murmur present. EXTREMITIES: negative SKIN: Skin color, texture, turgor normal. No rashes or lesions or normal  NEURO: positive for gait problems, memory problems, and weakness       Lorenza Burton, RN

## 2021-04-17 ENCOUNTER — Other Ambulatory Visit: Payer: Self-pay

## 2021-04-17 ENCOUNTER — Non-Acute Institutional Stay: Payer: Medicare Other

## 2021-04-17 VITALS — BP 152/60 | HR 74 | Temp 98.1°F | Resp 20

## 2021-04-17 DIAGNOSIS — Z515 Encounter for palliative care: Secondary | ICD-10-CM

## 2021-04-17 NOTE — Progress Notes (Signed)
PATIENT NAME: Marilyn Oconnell DOB: 1924-07-23 MRN: [id number]  PRIMARY CARE PROVIDER: Margo Common, PA-C  RESPONSIBLE PARTY:  Acct ID - Guarantor Home Phone Work Phone Relationship Acct Type  [id number] Cecilio [phone]  Self P/F     [address], [place], [place] [address]    PLAN OF CARE and INTERVENTIONS:               1.  GOALS OF CARE/ ADVANCE CARE PLANNING:  Remain in her current setting for as long as possible.  Move to SNF when appropriate.  Comfort care with no aggressive measures at this time.               2.  PATIENT/CAREGIVER EDUCATION:  Discussed at length options for placement, CT scan and ortho consult.               4. PERSONAL EMERGENCY PLAN:  Activate 911 for emergencies.                5.  DISEASE STATUS:  Patient found in her room sitting on the edge of the bed.  Patient just completed breakfast with 100% of oatmeal and mini blueberry muffin consumed. Patient has visual deficits and is found feeling around on her table looking for items.  Patient is no longer ambulatory due to right hip pain.  She is being transferred to a transport chair for toileting.  At this time, she remains continent of bowel and bladder. Ongoing education provided on using the call light to have staff assist with transfers.  Patient is mostly remaining in the bed unless she needs toileting.   Patient with confusion and memory deficits noted.  Staff report patient was calling for her niece yesterday morning.  Patient does not recall what she did on her birthday and if she had an visitors.   Am medications given to patient by Lorriane Shire, MT and breathing treatment administered.  Patient notes a chronic cough with very little sputum production.  Care plan meeting held with Michelle-niece, Lorraine Lax, RN for facility, [place], SW and myself.   Given patient continues with pain to her right hip, niece would like to proceed with a CT scan to confirm if a fracture is present,  consult with  ortho regarding recommendations once CT results are available and then see how to best proceed.  Goal is to keep patient comfortable.  [facility] NP Desiree will order an outpatient CT scan for patient and facility will coordinate with [facility] for consultation.  [facility] NP will increase Mobic to 15 mg bid to better manage patient's pain. Once results are back with recommendations, NP maybe able to refer patient directly to SNF.   HISTORY OF PRESENT ILLNESS:  87 year old female with a history of CHF and COPD.  Patient is being followed by Palliative Care monthly and PRN.  CODE STATUS: DNR ADVANCED DIRECTIVES: No MOST FORM: No PPS: 30%   PHYSICAL EXAM:   VITALS: Today's Vitals   04/17/21 0918  BP: (!) 152/60  Pulse: 74  Resp: 20  Temp: 98.1 F (36.7 C)  SpO2: 94%  PainSc: 0-No pain    LUNGS: scattered rales bilaterally CARDIAC: Cor RRR}  EXTREMITIES: - for edema. SKIN: Skin color, texture, turgor normal. No rashes or lesions or normal  NEURO: positive for dizziness, gait problems, memory problems, and weakness       Lorenza Burton, RN

## 2021-04-18 ENCOUNTER — Emergency Department: Payer: Medicare Other

## 2021-04-18 ENCOUNTER — Emergency Department
Admission: EM | Admit: 2021-04-18 | Discharge: 2021-04-18 | Disposition: A | Discharge status: Home / Self Care | Payer: Medicare Other | Attending: Student in an Organized Health Care Education/Training Program | Admitting: Student in an Organized Health Care Education/Training Program

## 2021-04-18 ENCOUNTER — Other Ambulatory Visit: Payer: Self-pay

## 2021-04-18 DIAGNOSIS — I2510 Atherosclerotic heart disease of native coronary artery without angina pectoris: Secondary | ICD-10-CM | POA: Insufficient documentation

## 2021-04-18 DIAGNOSIS — Z79899 Other long term (current) drug therapy: Secondary | ICD-10-CM | POA: Diagnosis not present

## 2021-04-18 DIAGNOSIS — N1832 Chronic kidney disease, stage 3b: Secondary | ICD-10-CM | POA: Insufficient documentation

## 2021-04-18 DIAGNOSIS — S3282XA Multiple fractures of pelvis without disruption of pelvic ring, initial encounter for closed fracture: Secondary | ICD-10-CM | POA: Insufficient documentation

## 2021-04-18 DIAGNOSIS — I130 Hypertensive heart and chronic kidney disease with heart failure and stage 1 through stage 4 chronic kidney disease, or unspecified chronic kidney disease: Secondary | ICD-10-CM | POA: Diagnosis not present

## 2021-04-18 DIAGNOSIS — Z20822 Contact with and (suspected) exposure to covid-19: Secondary | ICD-10-CM | POA: Insufficient documentation

## 2021-04-18 DIAGNOSIS — J45909 Unspecified asthma, uncomplicated: Secondary | ICD-10-CM | POA: Insufficient documentation

## 2021-04-18 DIAGNOSIS — S0990XA Unspecified injury of head, initial encounter: Secondary | ICD-10-CM | POA: Insufficient documentation

## 2021-04-18 DIAGNOSIS — W01198A Fall on same level from slipping, tripping and stumbling with subsequent striking against other object, initial encounter: Secondary | ICD-10-CM | POA: Insufficient documentation

## 2021-04-18 DIAGNOSIS — S3993XA Unspecified injury of pelvis, initial encounter: Secondary | ICD-10-CM | POA: Diagnosis present

## 2021-04-18 DIAGNOSIS — J449 Chronic obstructive pulmonary disease, unspecified: Secondary | ICD-10-CM | POA: Diagnosis not present

## 2021-04-18 DIAGNOSIS — I5032 Chronic diastolic (congestive) heart failure: Secondary | ICD-10-CM | POA: Diagnosis not present

## 2021-04-18 DIAGNOSIS — S32599A Other specified fracture of unspecified pubis, initial encounter for closed fracture: Secondary | ICD-10-CM

## 2021-04-18 LAB — URINALYSIS, COMPLETE (UACMP) WITH MICROSCOPIC
Bacteria, UA: NONE SEEN
Bilirubin Urine: NEGATIVE
Glucose, UA: NEGATIVE mg/dL
Hgb urine dipstick: NEGATIVE
Ketones, ur: NEGATIVE mg/dL
Leukocytes,Ua: NEGATIVE
Nitrite: NEGATIVE
Protein, ur: 30 mg/dL — AB
Specific Gravity, Urine: 1.006 (ref 1.005–1.030)
Squamous Epithelial / HPF: NONE SEEN (ref 0–5)
pH: 5 (ref 5.0–8.0)

## 2021-04-18 LAB — COMPREHENSIVE METABOLIC PANEL
ALT: 9 U/L (ref 0–44)
AST: 16 U/L (ref 15–41)
Albumin: 3.7 g/dL (ref 3.5–5.0)
Alkaline Phosphatase: 68 U/L (ref 38–126)
Anion gap: 9 (ref 5–15)
BUN: 39 mg/dL — ABNORMAL HIGH (ref 8–23)
CO2: 29 mmol/L (ref 22–32)
Calcium: 9.4 mg/dL (ref 8.9–10.3)
Chloride: 96 mmol/L — ABNORMAL LOW (ref 98–111)
Creatinine, Ser: 1.57 mg/dL — ABNORMAL HIGH (ref 0.44–1.00)
GFR, Estimated: 30 mL/min — ABNORMAL LOW (ref >60)
Glucose, Bld: 114 mg/dL — ABNORMAL HIGH (ref 70–99)
Potassium: 4.5 mmol/L (ref 3.5–5.1)
Sodium: 134 mmol/L — ABNORMAL LOW (ref 135–145)
Total Bilirubin: 0.7 mg/dL (ref 0.3–1.2)
Total Protein: 7.1 g/dL (ref 6.5–8.1)

## 2021-04-18 LAB — RESP PANEL BY RT-PCR (FLU A&B, COVID) ARPGX2
Influenza A by PCR: NEGATIVE
Influenza B by PCR: NEGATIVE
SARS Coronavirus 2 by RT PCR: NEGATIVE

## 2021-04-18 LAB — CBC
HCT: 33.5 % — ABNORMAL LOW (ref 36.0–46.0)
Hemoglobin: 11.2 g/dL — ABNORMAL LOW (ref 12.0–15.0)
MCH: 30.9 pg (ref 26.0–34.0)
MCHC: 33.4 g/dL (ref 30.0–36.0)
MCV: 92.3 fL (ref 80.0–100.0)
Platelets: 275 10*3/uL (ref 150–400)
RBC: 3.63 MIL/uL — ABNORMAL LOW (ref 3.87–5.11)
RDW: 13.4 % (ref 11.5–15.5)
WBC: 7.9 10*3/uL (ref 4.0–10.5)
nRBC: 0 % (ref 0.0–0.2)

## 2021-04-18 IMAGING — CT CT HEAD W/O CM
3 series · 16 of 47 positions shown, 19 images · non-contrast
Comparison: [DATE]

CLINICAL DATA: Head trauma, minor (Age >= 65y).  Hit head

EXAM:
CT HEAD WITHOUT CONTRAST
TECHNIQUE: Contiguous axial images were obtained from the base of the skull
through the vertex without intravenous contrast.

[Series 2: head wo · axial · 0.40mm/px · z∈[-197,-62]mm · 10 of 33 slices shown, 13 images]
[im 3/33  brain]
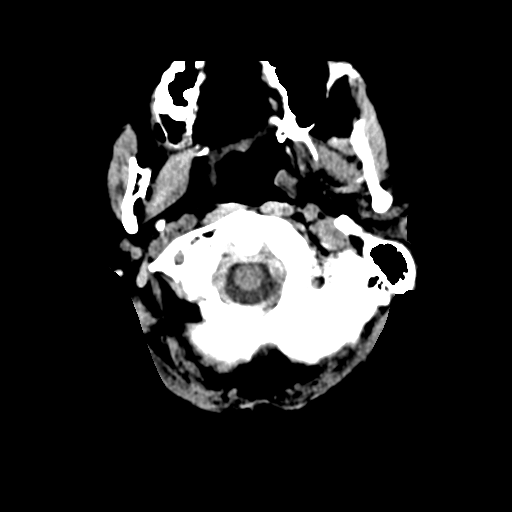
[im 3/33  bone]
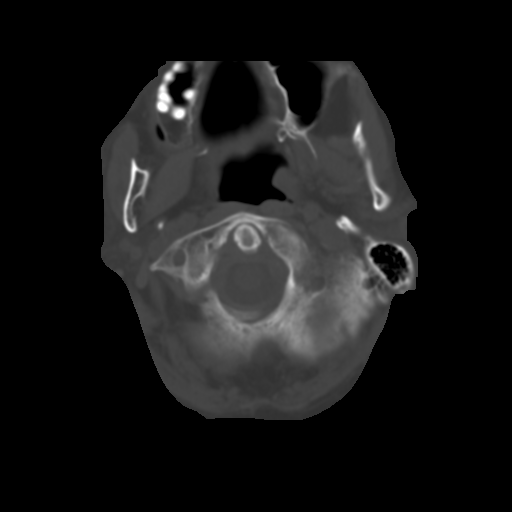
[im 6/33  brain]
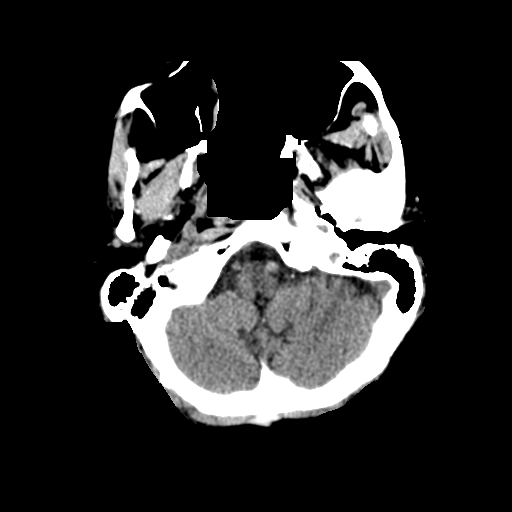
[im 9/33  brain]
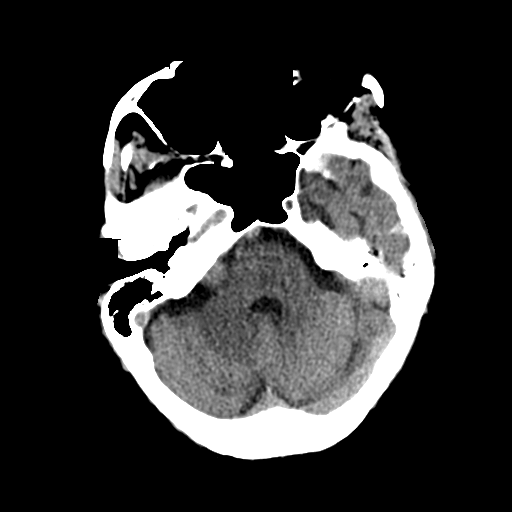
[im 12/33  brain]
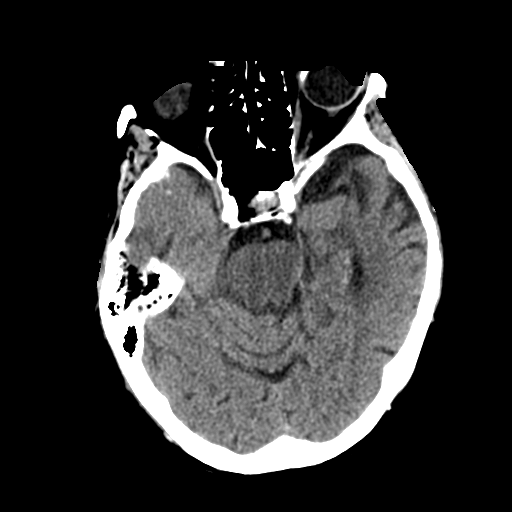
[im 15/33  brain]
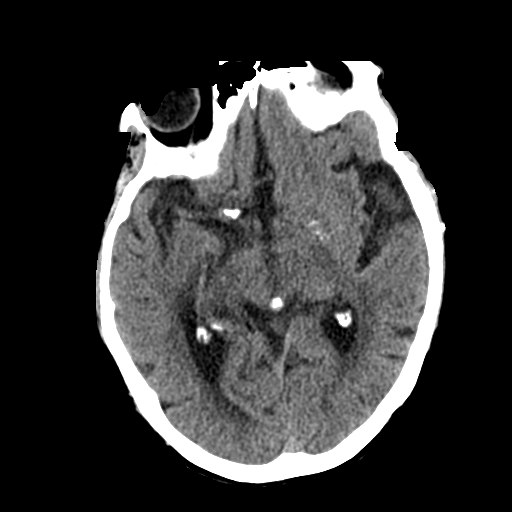
[im 15/33  bone]
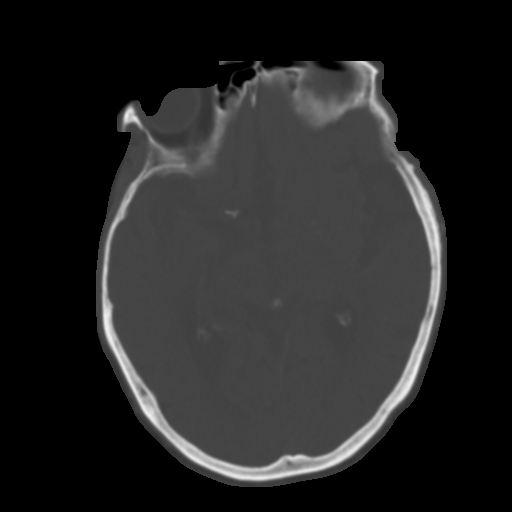
[im 18/33  brain]
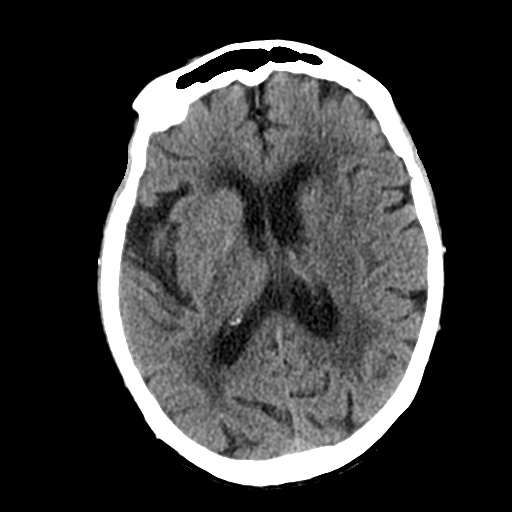
[im 21/33  brain]
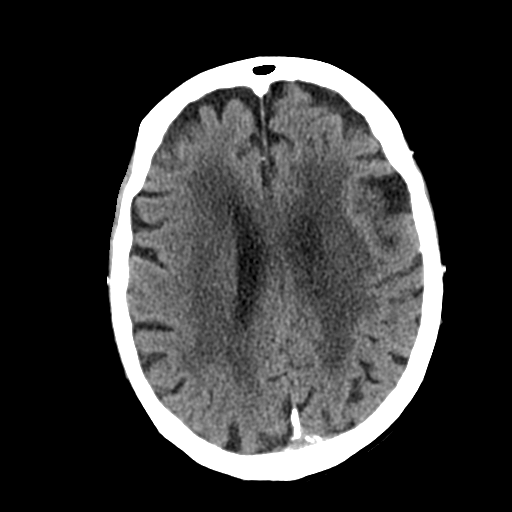
[im 25/33  brain]
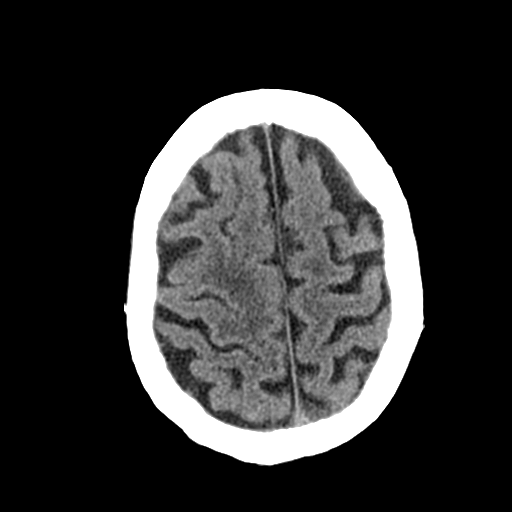
[im 27/33  brain]
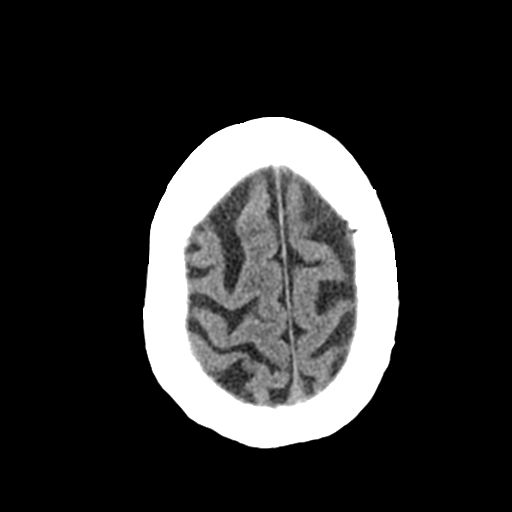
[im 27/33  bone]
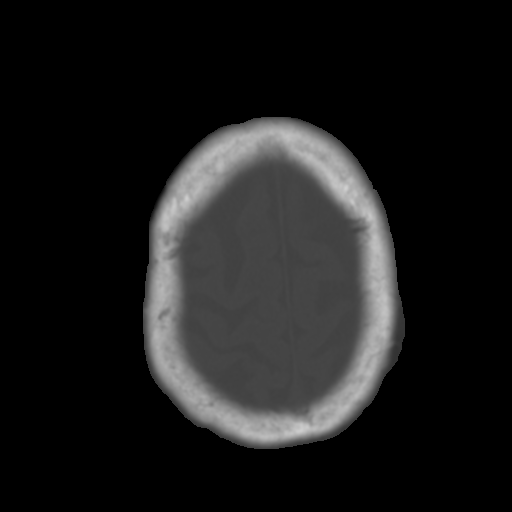
[im 30/33  brain]
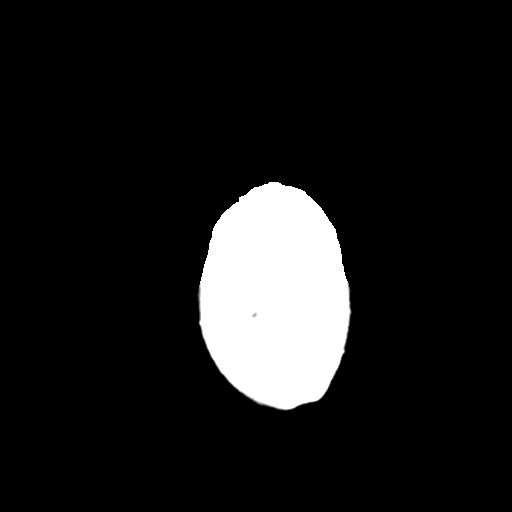

[Series 4: coronal soft tissue · coronal · 0.31mm/px · 3 of 66 slices shown]
[im 25/66  brain]
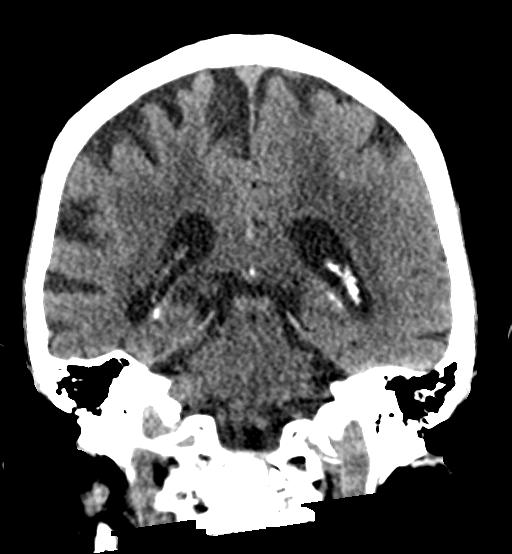
[im 30/66  brain]
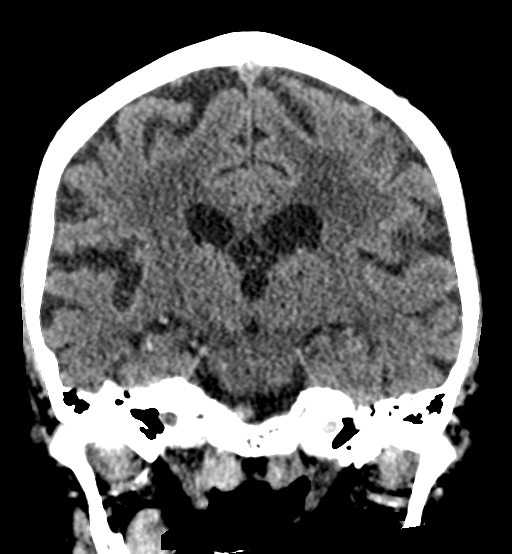
[im 36/66  brain]
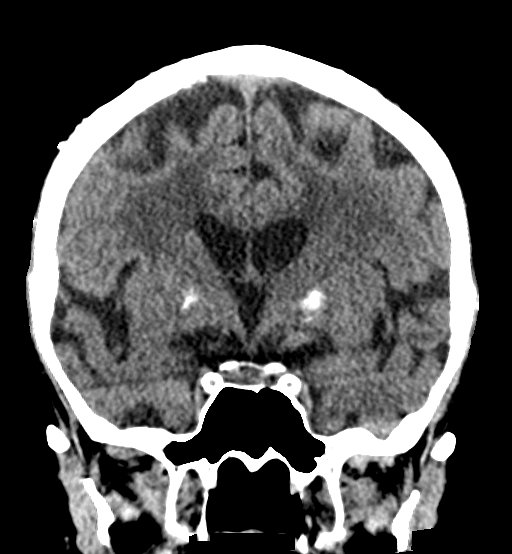

[Series 5: sagittal soft tissue · sagittal · 0.33mm/px · 3 of 55 slices shown]
[im 19/55  brain]
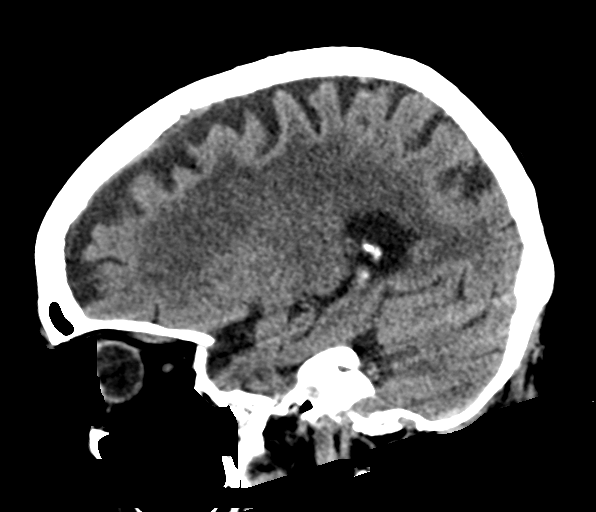
[im 28/55  brain]
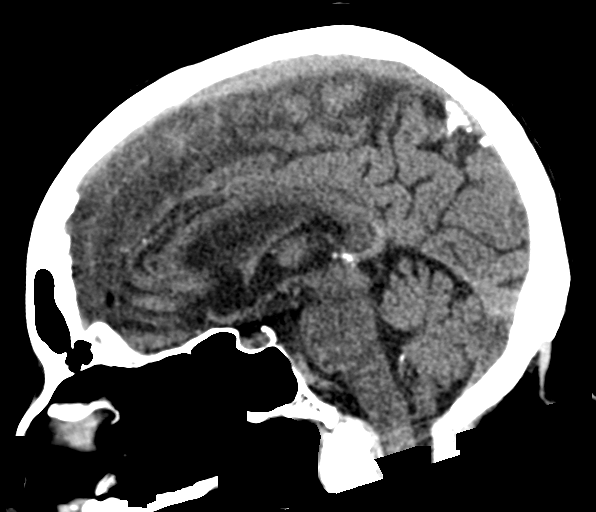
[im 37/55  brain]
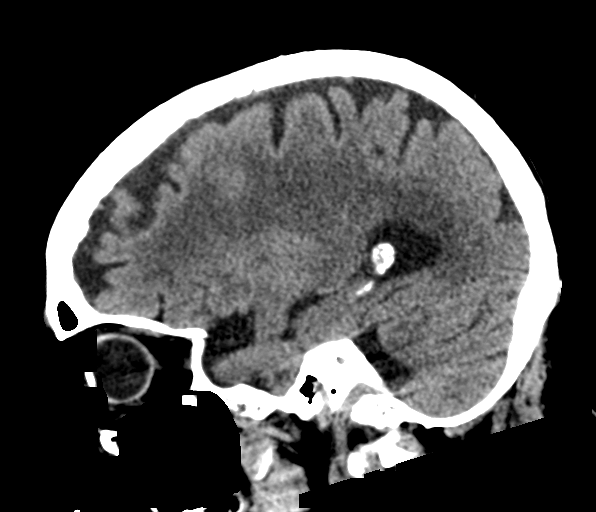

[16 of 47 positions shown; findings below may reference images not displayed]

FINDINGS: Brain: Physiologic calcifications in the basal ganglia. There is
atrophy and chronic small vessel disease changes. No acute
intracranial abnormality. Specifically, no hemorrhage,
hydrocephalus, mass lesion, acute infarction, or significant
intracranial injury.

Vascular: No hyperdense vessel or unexpected calcification.

Skull: No acute calvarial abnormality.

Sinuses/Orbits: Visualized paranasal sinuses and mastoids clear.
Orbital soft tissues unremarkable.

Other: None
IMPRESSION: Atrophy, chronic microvascular disease.

No acute intracranial abnormality.

## 2021-04-18 IMAGING — CT CT HIP*R* W/O CM
2 of 3 series · 16 of 46 positions shown, 18 images · non-contrast
Comparison: X-ray [DATE]

CLINICAL DATA: Right hip pain after fall.  Pubic fractures on x-ray

EXAM:
CT OF THE RIGHT HIP WITHOUT CONTRAST
TECHNIQUE: Multidetector CT imaging of the right hip was performed according to
the standard protocol. Multiplanar CT image reconstructions were
also generated.

[Series 3: axial st · axial · 0.41mm/px · z∈[-1002,-832]mm · 13 of 99 slices shown, 15 images]
[im 7/99  soft-tissue]
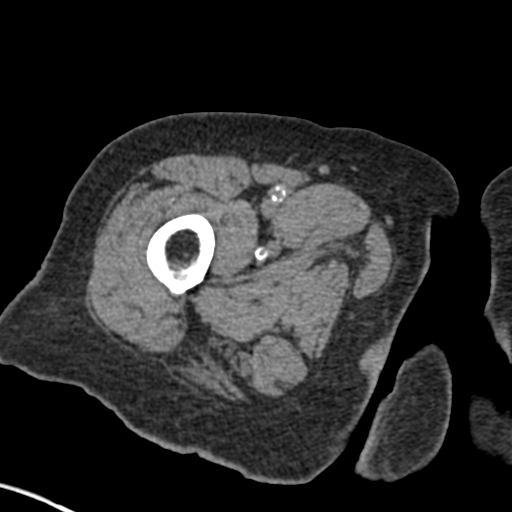
[im 7/99  bone]
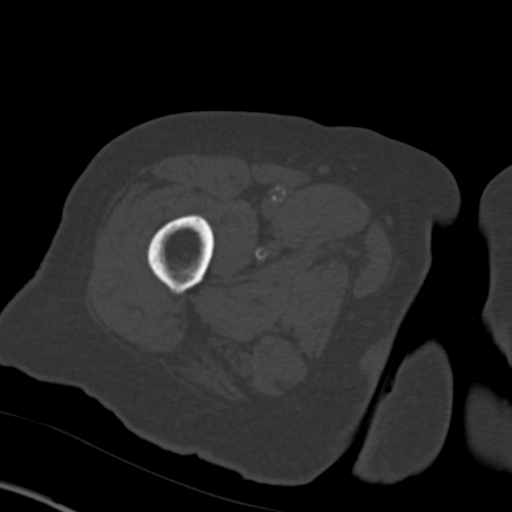
[im 13/99  soft-tissue]
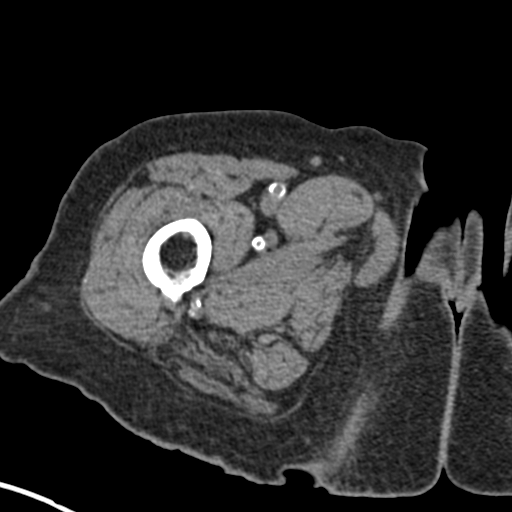
[im 19/99  soft-tissue]
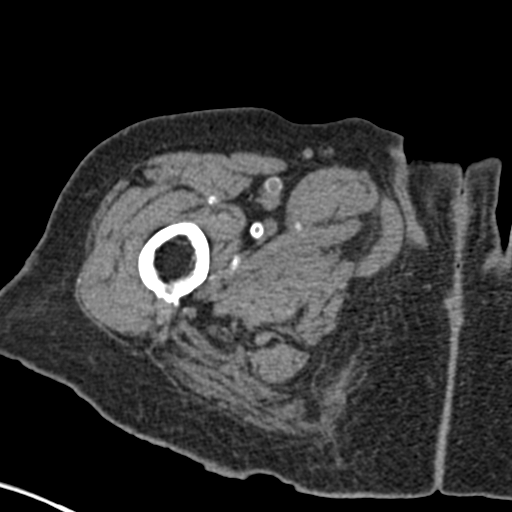
[im 29/99  soft-tissue]
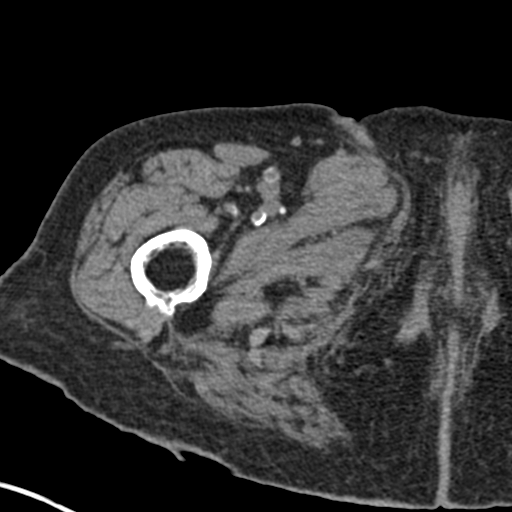
[im 35/99  soft-tissue]
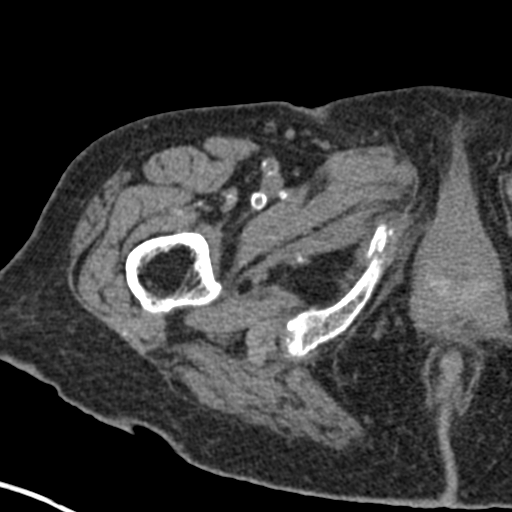
[im 42/99  soft-tissue]
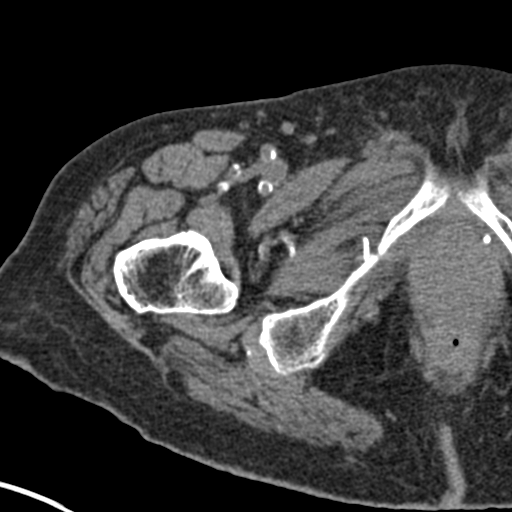
[im 51/99  soft-tissue]
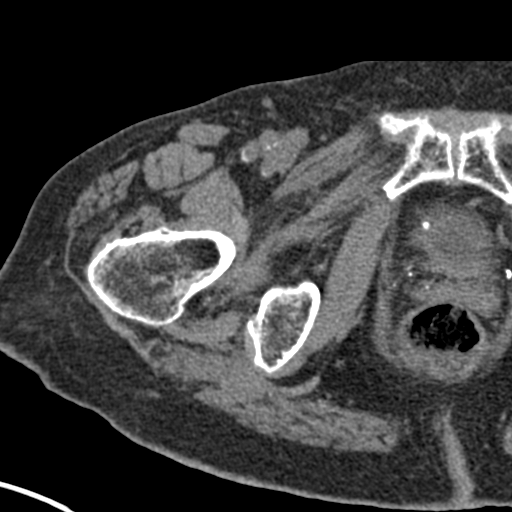
[im 57/99  soft-tissue]
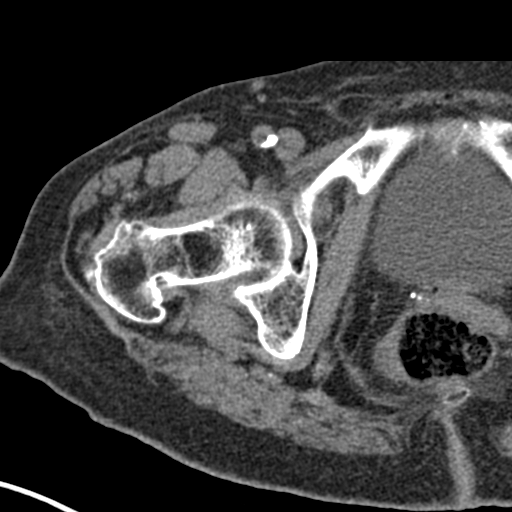
[im 64/99  soft-tissue]
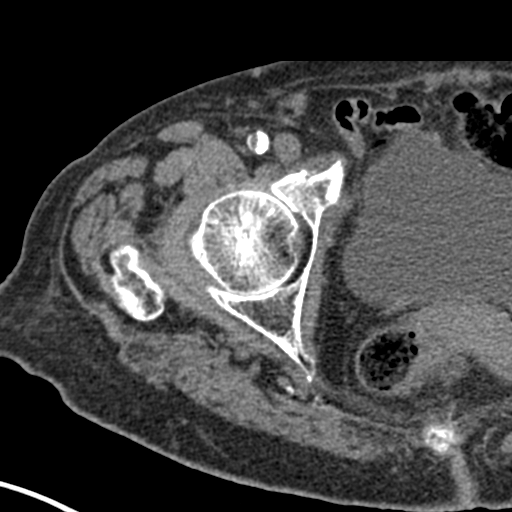
[im 64/99  bone]
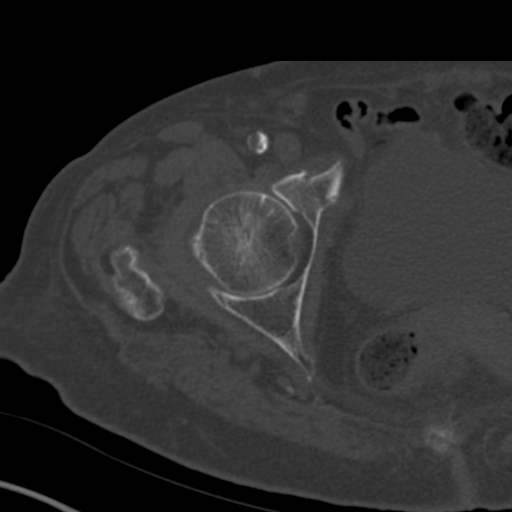
[im 70/99  soft-tissue]
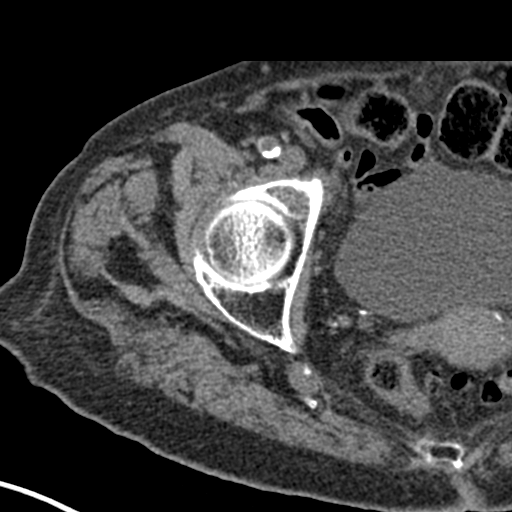
[im 80/99  soft-tissue]
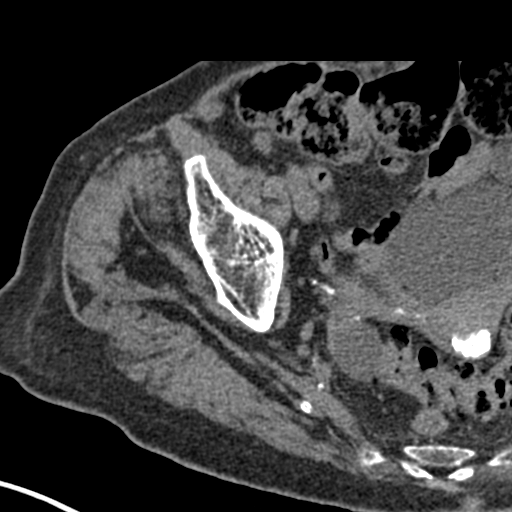
[im 86/99  soft-tissue]
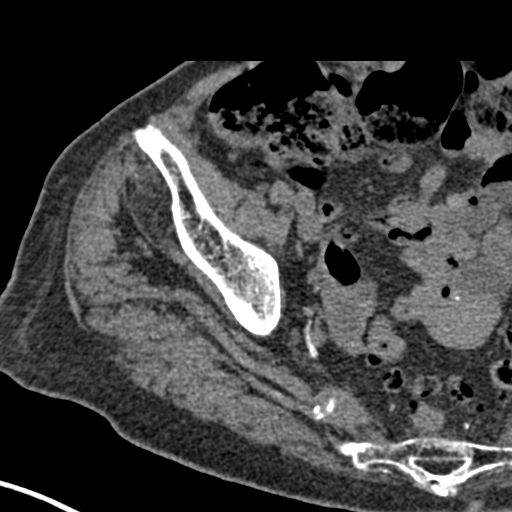
[im 92/99  soft-tissue]
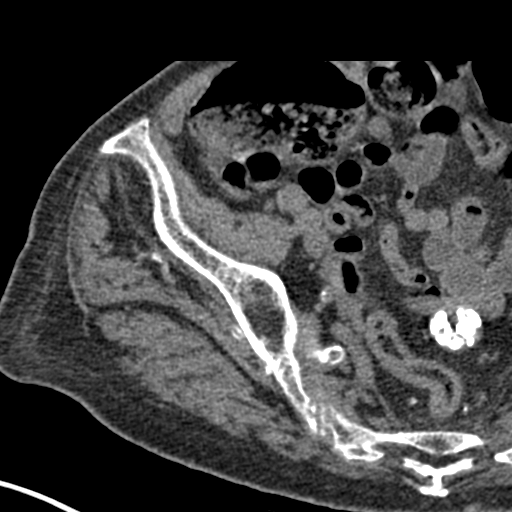

[Series 7: coronal st · coronal · 0.39mm/px · 3 of 93 slices shown]
[im 31/93  soft-tissue]
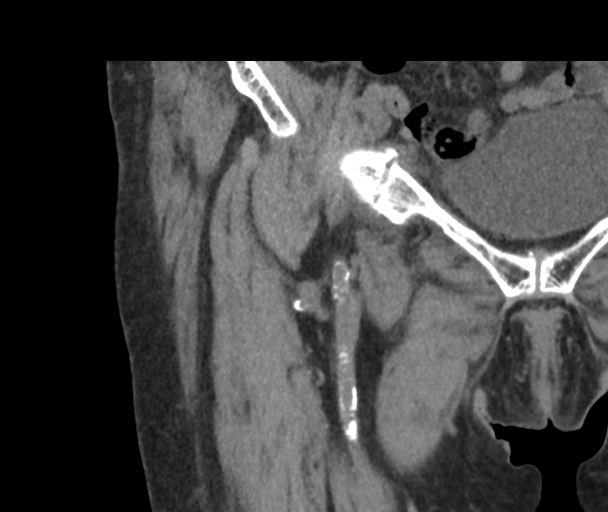
[im 41/93  soft-tissue]
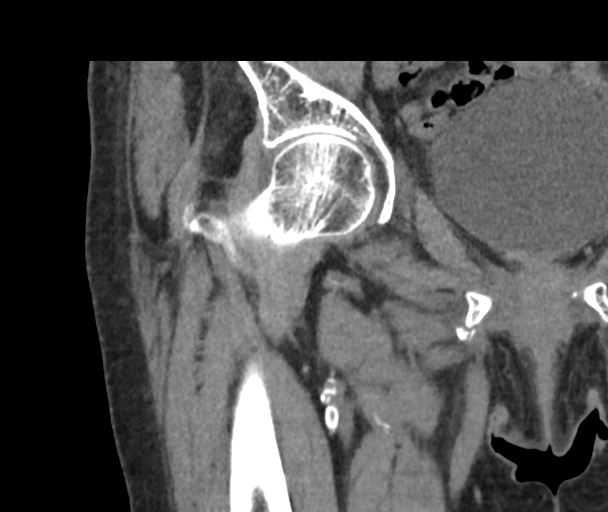
[im 52/93  soft-tissue]
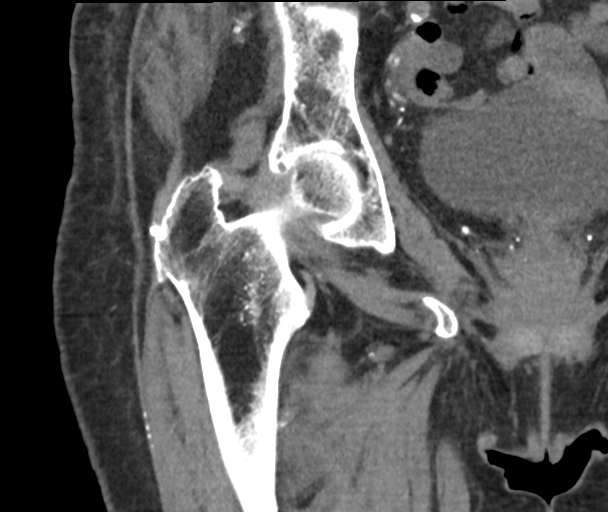

[16 of 46 positions shown; findings below may reference images not displayed]

FINDINGS: Bones/Joint/Cartilage

Acute minimally displaced fracture at the right superior pubic
ramus/pubic root (series 6, images 31-37). Acute mildly comminuted
and minimally displaced fracture of the midportion of the right
inferior pubic ramus (series 2, image 63). No parasymphyseal pubic
bone fracture. Pubic symphysis intact. Right hip joint is intact
without dislocation. Proximal femur intact without fracture. No
evidence of avascular necrosis. Trace right hip joint effusion,
nonspecific.

Ligaments

Suboptimally assessed by CT.

Muscles and Tendons

There is some fatty infiltration with right gluteus minimus muscle.
No acute musculotendinous abnormality is evident by CT.

Soft tissues

No pelvic sidewall hematoma. No hematoma seen within the soft
tissues about the right hip. Extensive calcified atherosclerotic
disease. Calcified uterine fibroids are present. 2.7 cm simple right
adnexal cyst incidentally noted.
IMPRESSION: 1. Acute minimally displaced fracture at the right superior pubic
ramus/pubic root.
2. Acute mildly comminuted and minimally displaced fracture of the
right inferior pubic ramus.
3. Trace right hip joint effusion, nonspecific.
4. Incidentally noted 2.7 cm simple right adnexal cyst, likely
benign. No follow-up imaging recommended. Note: This recommendation
does not apply to premenarchal patients and to those with increased
risk (genetic, family history, elevated tumor markers or other
high-risk factors) of ovarian cancer. Reference: JACR [DATE];

## 2021-04-18 IMAGING — CT CT CERVICAL SPINE W/O CM
3 of 4 series · 13 of 33 positions shown, 16 images · non-contrast
Comparison: [DATE]

CLINICAL DATA: Neck trauma (Age >= 65y).  Fall.

EXAM:
CT CERVICAL SPINE WITHOUT CONTRAST
TECHNIQUE: Multidetector CT imaging of the cervical spine was performed without
intravenous contrast. Multiplanar CT image reconstructions were also
generated.

[Series 5: sagittal bone · sagittal · 0.26mm/px · 5 of 61 slices shown, 6 images]
[im 21/61  bone]
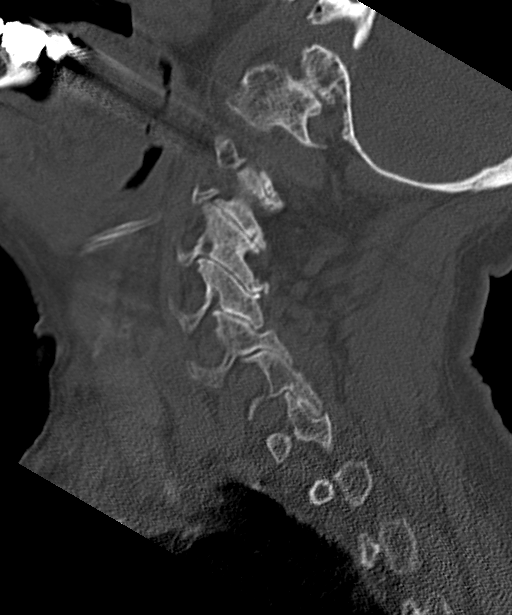
[im 26/61  bone]
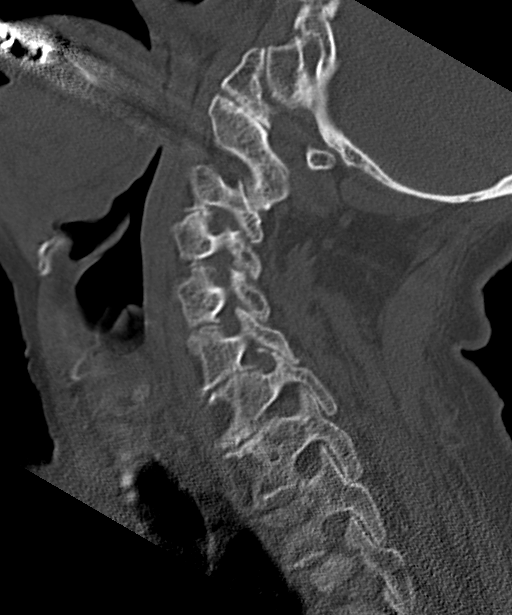
[im 31/61  soft-tissue]
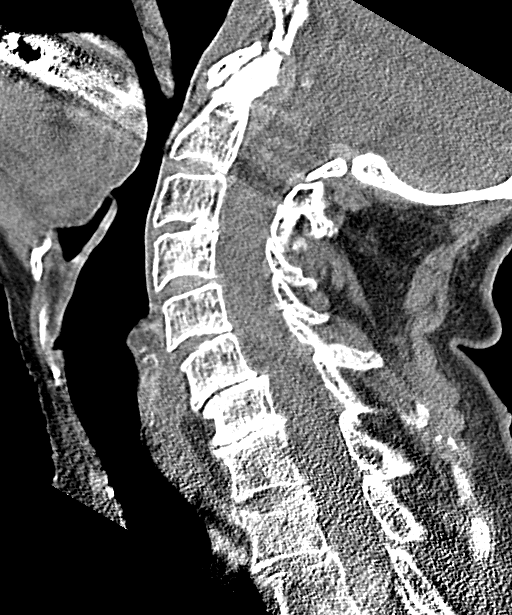
[im 31/61  bone]
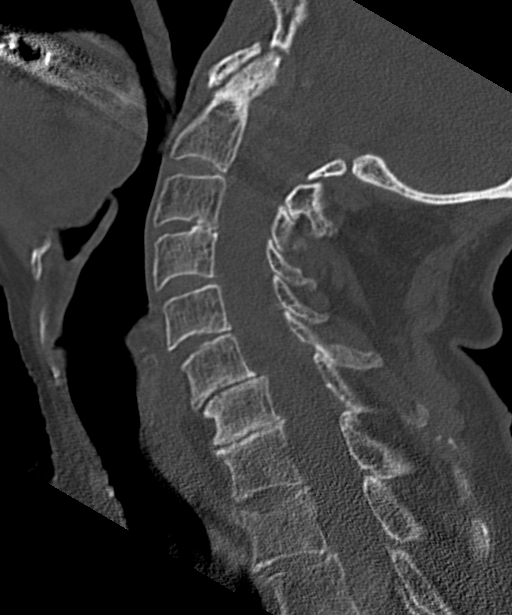
[im 36/61  bone]
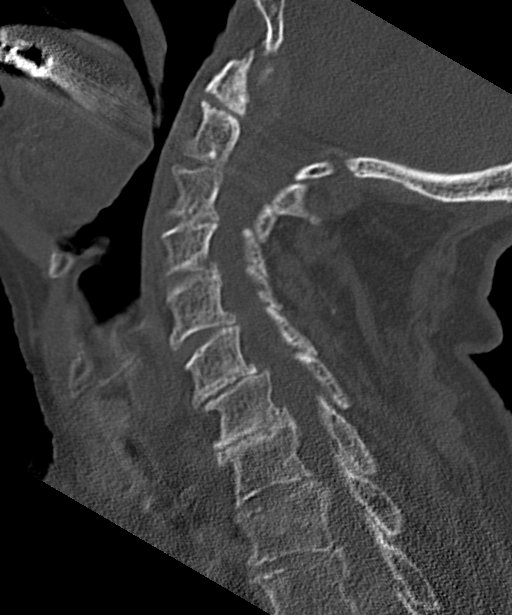
[im 41/61  bone]
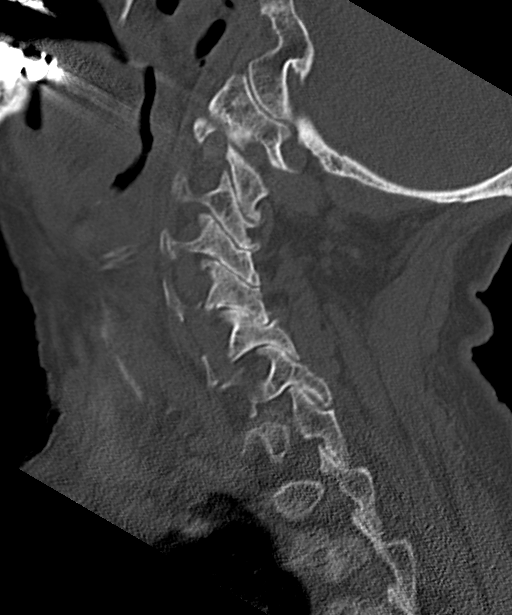

[Series 6: coronal bone · coronal · 0.24mm/px · 3 of 68 slices shown]
[im 19/68  bone]
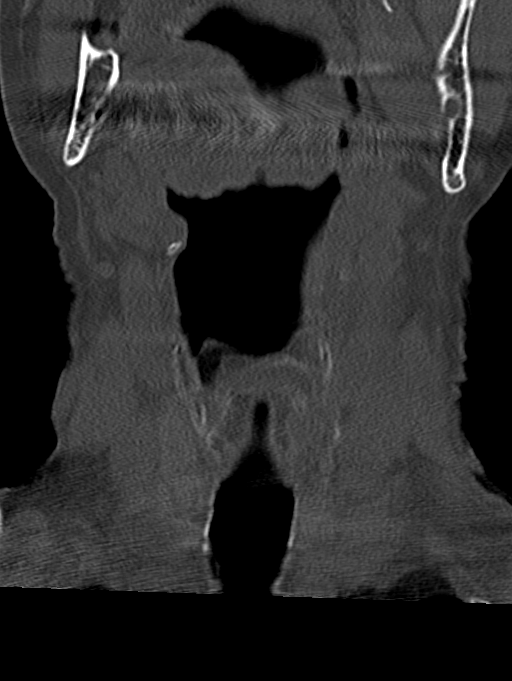
[im 29/68  bone]
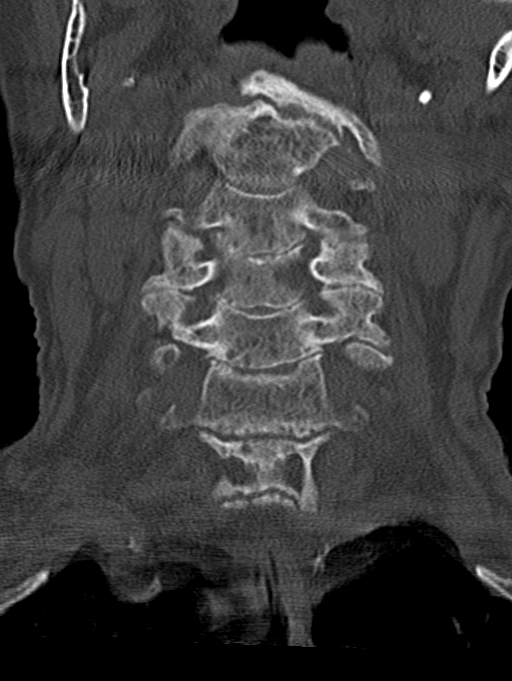
[im 39/68  bone]
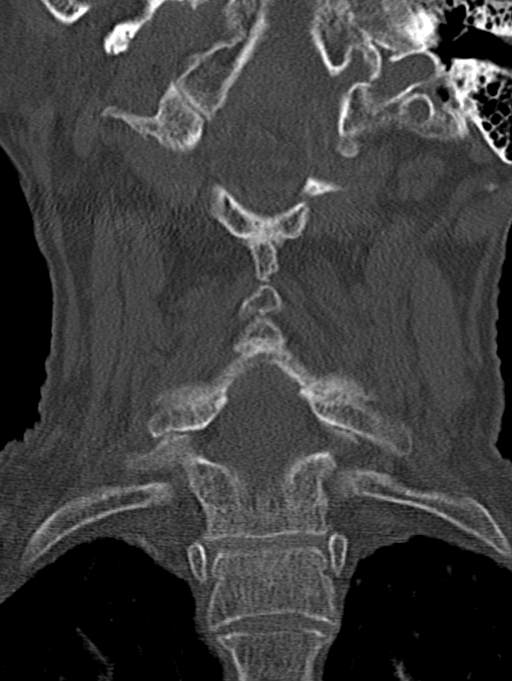

[Series 7: orthogonal bone · axial · 0.24mm/px · z∈[-344,-252]mm · 5 of 81 slices shown, 7 images]
[im 14/81  soft-tissue]
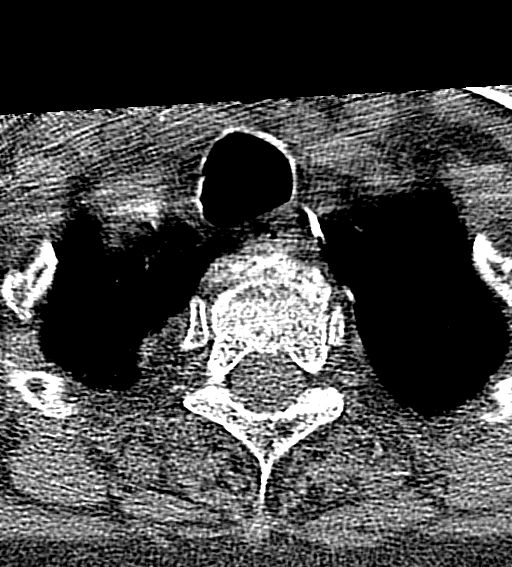
[im 14/81  bone]
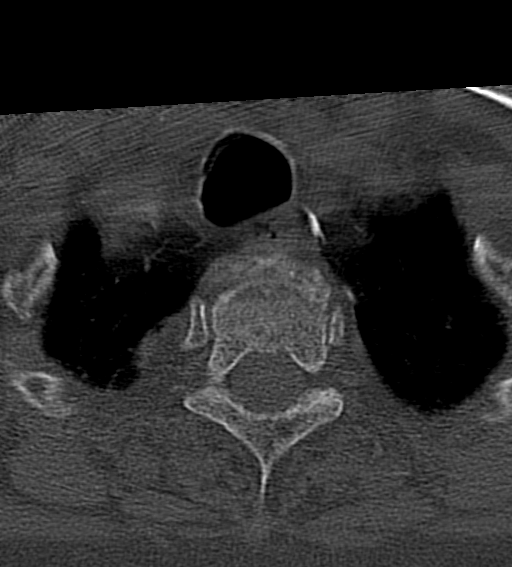
[im 27/81  bone]
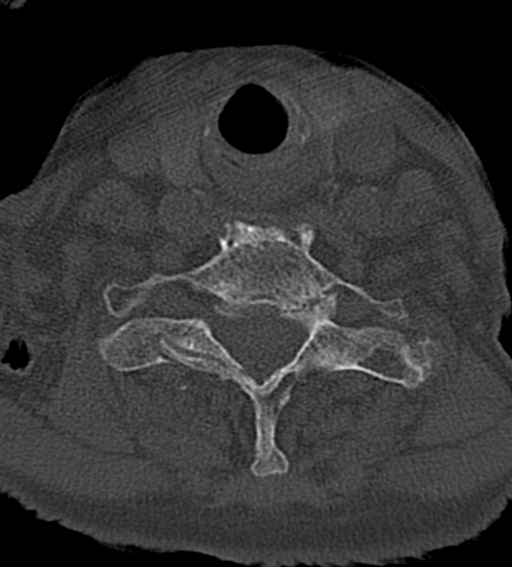
[im 41/81  bone]
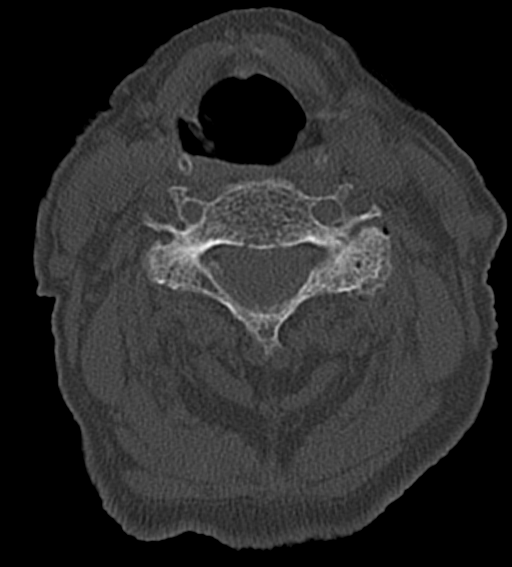
[im 54/81  bone]
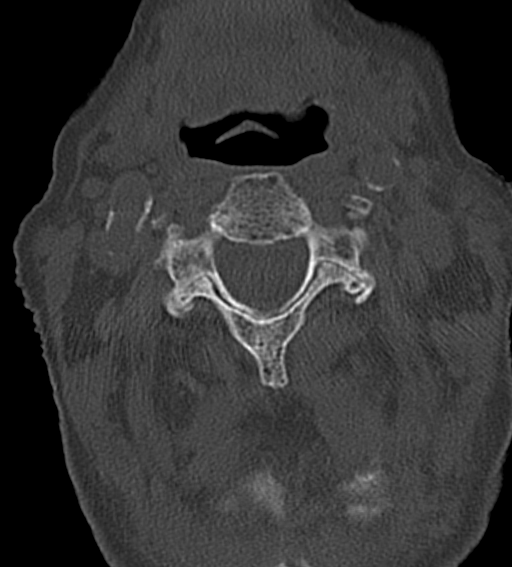
[im 67/81  soft-tissue]
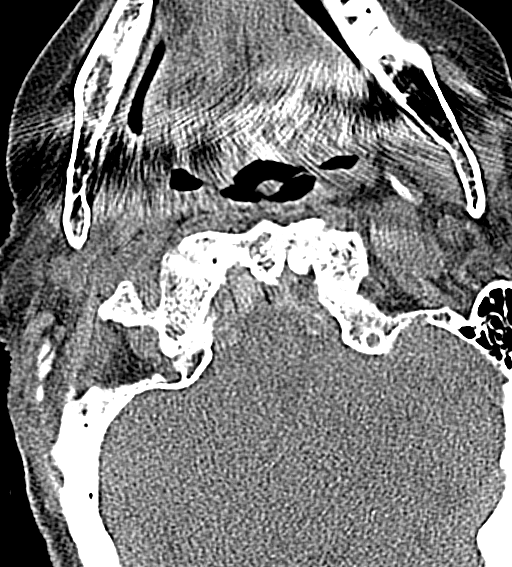
[im 67/81  bone]
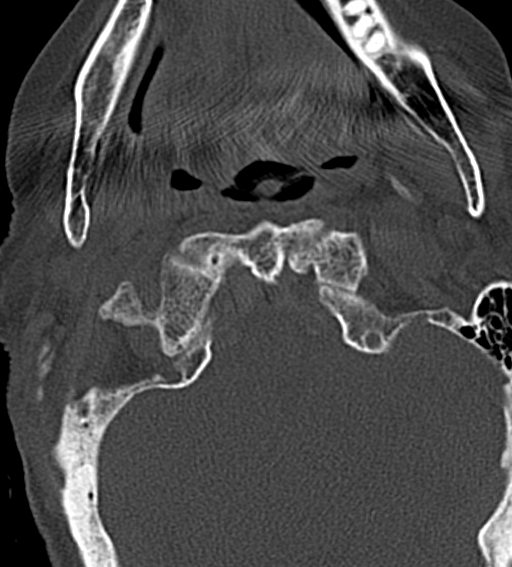

[13 of 33 positions shown; findings below may reference images not displayed]

FINDINGS: Alignment: Slight degenerative anterolisthesis of C6 on C7, stable.

Skull base and vertebrae: No acute fracture. No primary bone lesion
or focal pathologic process.

Soft tissues and spinal canal: No prevertebral fluid or swelling. No
visible canal hematoma.

Disc levels: Diffuse degenerative disc disease and facet disease,
unchanged, advanced.

Upper chest: No acute findings

Other: None
IMPRESSION: Diffuse degenerative disc and facet disease. No acute bony
abnormality.

## 2021-04-18 IMAGING — CR DG HIP (WITH OR WITHOUT PELVIS) 2-3V*R*
3 series · 3 of 3 positions shown · non-contrast
Comparison: Radiographs [DATE].

CLINICAL DATA: Fell. Right hip pain.

EXAM:
DG HIP (WITH OR WITHOUT PELVIS) 2-3V RIGHT

[pelvis ap]
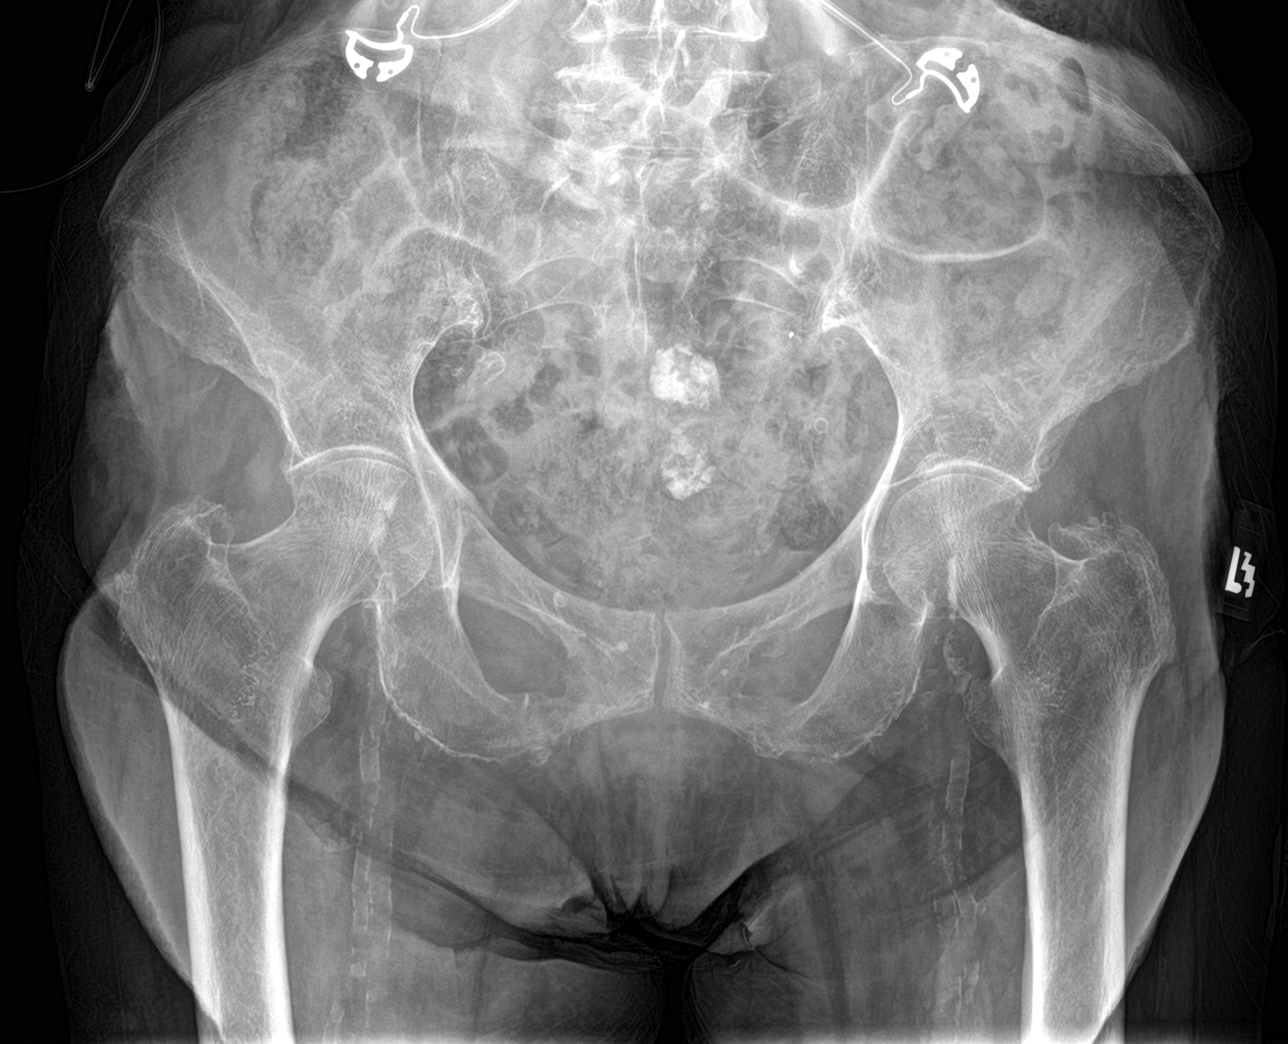

[hip ap]
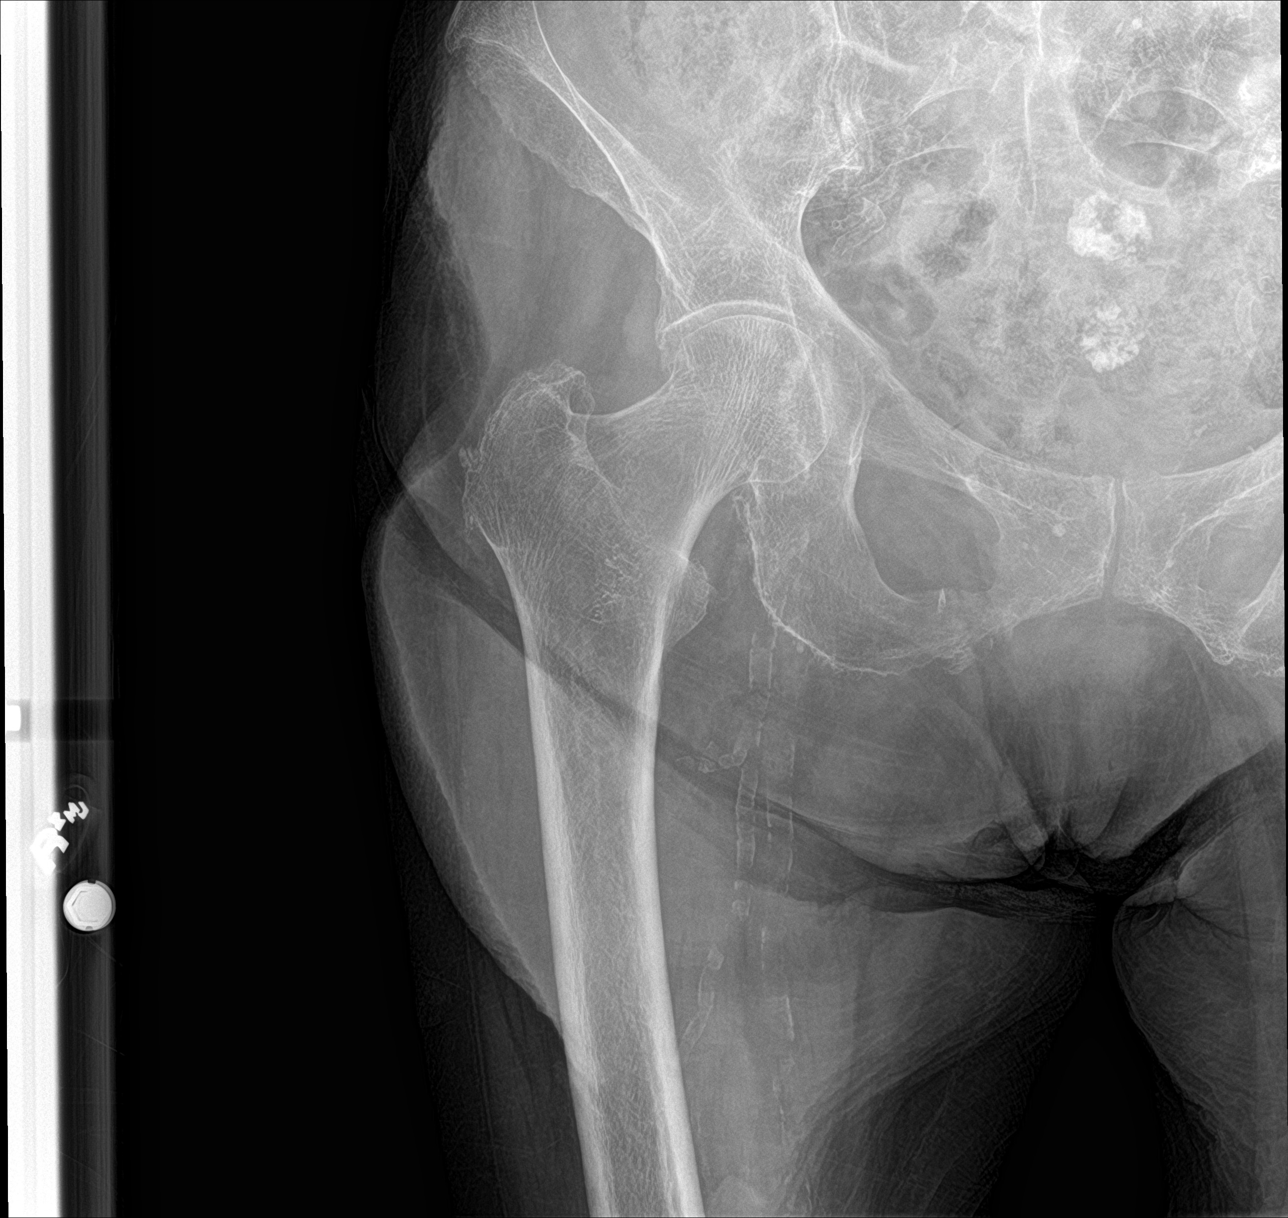

[hip lat]
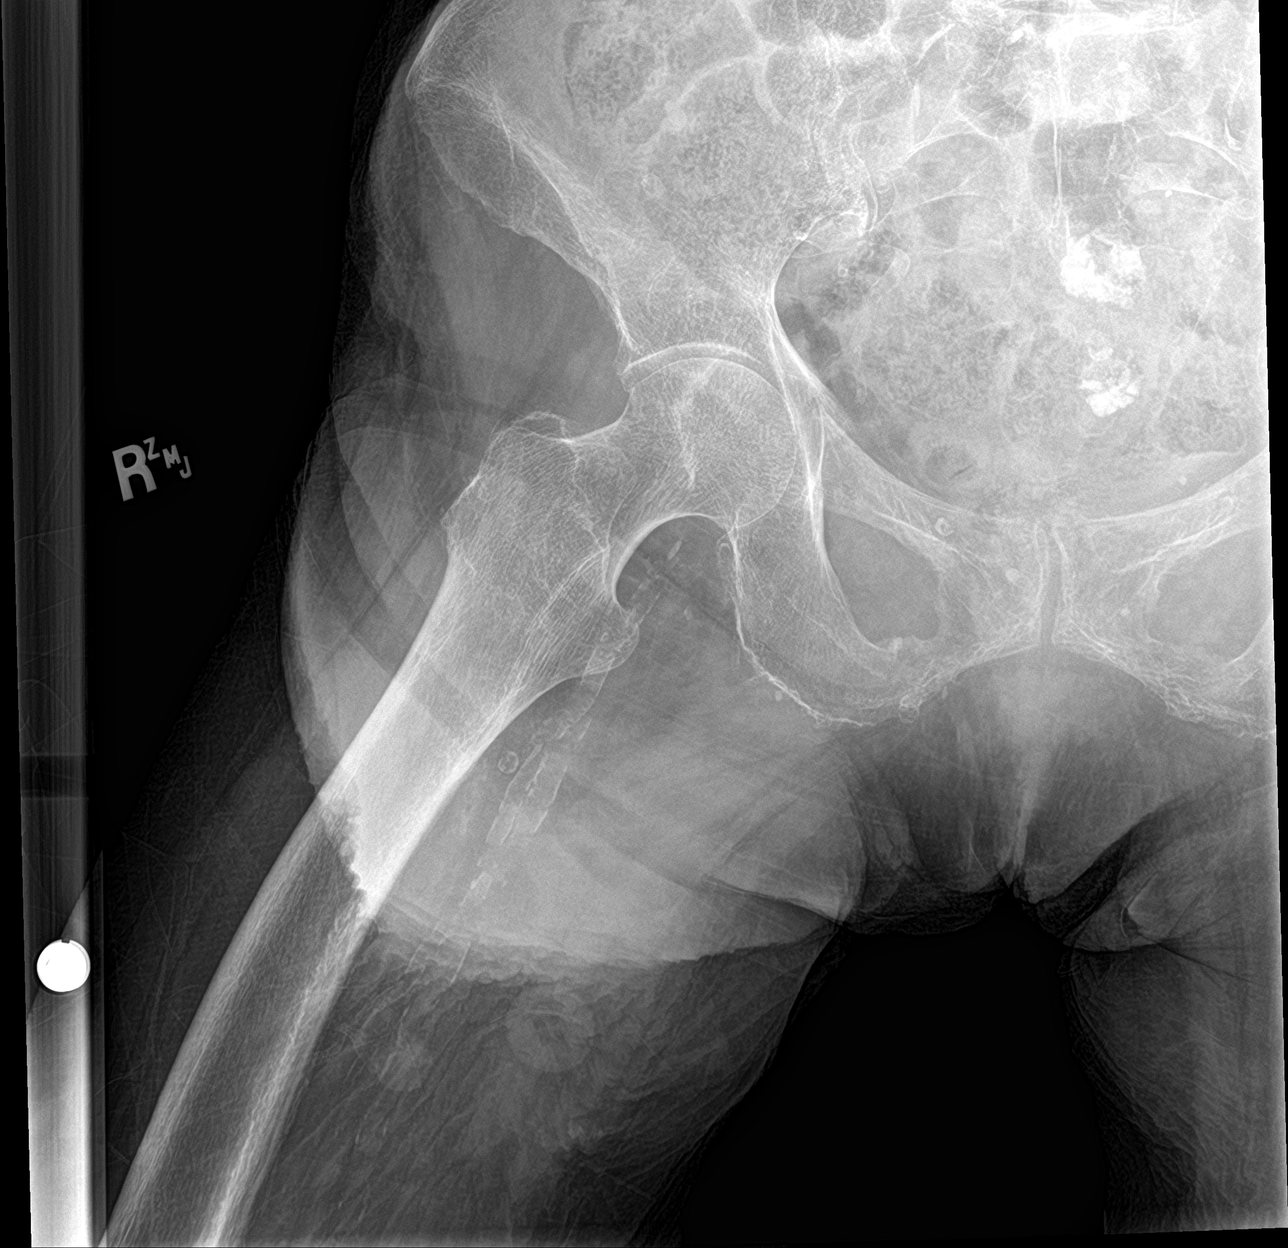

[3 of 3 positions shown; findings below may reference images not displayed]

FINDINGS: Both hips are normally located. No acute hip fracture or plain film
evidence of AVN. The pubic symphysis and SI joints are intact.

Superior and inferior pubic rami fractures are noted on the right
side.

Advanced vascular calcifications are noted along with calcified
uterine fibroids.
IMPRESSION: 1. Superior and inferior pubic rami fractures on the right.
2. No hip fracture.

## 2021-04-18 MED ORDER — HYDRALAZINE HCL 10 MG PO TABS
10.0000 mg | ORAL_TABLET | Freq: Once | ORAL | Status: AC
  Administered 2021-04-18: 10 mg via ORAL
  Filled 2021-04-18: qty 1

## 2021-04-18 MED ORDER — LOSARTAN POTASSIUM 25 MG PO TABS
12.5000 mg | ORAL_TABLET | Freq: Every day | ORAL | Status: DC
  Administered 2021-04-18: 12.5 mg via ORAL
  Filled 2021-04-18: qty 0.5

## 2021-04-18 MED ORDER — OXYCODONE HCL 5 MG PO TABS: 5.0000 mg | ORAL_TABLET | Freq: Once | ORAL | Status: DC

## 2021-04-18 NOTE — ED Triage Notes (Signed)
Pt to ED [facility] from [facility] for fall this am, hit head, no blood thinner use noted. Reported right hip pain, no shortening or rotation.  Denies pain at this time Pt disoriented to year

## 2021-04-18 NOTE — ED Notes (Signed)
Pt to Xray.

## 2021-04-18 NOTE — Progress Notes (Signed)
PT Cancellation Note  Patient Details Name: Marilyn Oconnell MRN: [id number] DOB: Sep 08, 1924   Cancelled Treatment:    Reason Eval/Treat Not Completed: Medical issues which prohibited therapy;Patient not medically ready (Pt still being worked up for multiple pelvic fractures. BP remain elevated outside safe range for attempting mobilization. Will attempt evaluation at later date/time.)  1:56 PM, 04/18/21 Etta Grandchild, PT, DPT Physical Therapist - [facility]  [phone] ([place])    [place] C 04/18/2021, 1:56 PM

## 2021-04-18 NOTE — Discharge Instructions (Signed)
You may bear weight on the right leg as tolerated.  Please follow up with Orthopedics.  Return to the ER for any additional questions or concerns.

## 2021-04-18 NOTE — TOC Initial Note (Signed)
Transition of Care [facility]) - Initial/Assessment Note    Patient Details  Name: Marilyn Oconnell MRN: [id number] Date of Birth: 30-Jan-1924  Transition of [facility]) CM/SW Contact:    Ova Freshwater Phone Number: [phone] 04/18/2021, 3:09 PM  Clinical Narrative:                  Patient presents to ED from [facility] due to fall. Pt was unable to evaluate patient due to high BP. Attending updated.  CSW spoke with patient's main contact Oren Section (Niece) [phone] [facility] Phone) with update.  Ms. Dema Severin stated the patient is having increased difficulty w/ ADLs and has fallen twice in the last week. Ms. Dema Severin is concerned the patient night need a higher level of care, like long term care.  CSW and Ms. White discussed long term care placement from ALF.    Barriers to Discharge: No Barriers Identified   Patient Goals and CMS Choice        Expected Discharge Plan and Services                                                Prior Living Arrangements/Services                       Activities of Daily Living      Permission Sought/Granted                  Emotional Assessment              Admission diagnosis:  fall Patient Active Problem List   Diagnosis Date Noted   Fall 06/30/2020   Hypertension    Coronary artery disease    COPD (chronic obstructive pulmonary disease) ([place])    Hypertensive urgency    Elevated troponin    PAF (paroxysmal atrial fibrillation) (HCC)    Chronic diastolic CHF (congestive heart failure) ([place])    Current mild episode of major depressive disorder, unspecified whether recurrent ([place]) 04/25/2020   Advanced age 76/09/2019   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   Blindness 03/23/2020   Atrial fibrillation with RVR (HCC) 03/22/2020   Chronic pain syndrome 03/16/2020   Closed nondisplaced fracture of surgical neck of left humerus 03/16/2020   Chronic left-sided headaches 03/16/2020   Occipital  neuralgia of left side 03/16/2020   Syncope 07/20/2019   Recurrent syncope 07/19/2019   Insomnia 03/18/2016   Visual hallucinations 03/18/2016   Vertigo 03/15/2016   Paroxysmal atrial fibrillation with RVR (HCC) 11/27/2015   Depression 11/27/2015   Adjustment disorder with disturbance of emotion 09/28/2015   Severe aortic stenosis 09/28/2015   Bilateral cataracts 09/28/2015   Cardiac murmur 09/28/2015   Acute renal failure superimposed on stage 3b chronic kidney disease ([place]) 09/28/2015   History of gout 09/28/2015   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/2015   OP (osteoporosis) 09/28/2015   Atherosclerosis of coronary artery 09/08/2015   Avitaminosis D 07/11/2015   LBP (low back pain) 07/11/2015   BP (high blood pressure) 07/11/2015   HLD (hyperlipidemia) 04/12/2015   Billowing mitral valve 08/03/2014   Fibrosis of lung ([place]) 05/25/2014   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

## 2021-04-18 NOTE — ED Notes (Signed)
Pt back from CT

## 2021-04-18 NOTE — ED Provider Notes (Signed)
[facility] Emergency Department Provider Note    Event Date/Time   First MD Initiated Contact with Patient 04/18/21 1059     (approximate)  I have reviewed the triage vital signs and the nursing notes.   HISTORY  Chief Complaint Fall    HPI Marilyn Oconnell is a 87 y.o. female with below listed past medical history presents to the ER for evaluation of right hip pain that occurred after fall this morning.  Reportedly supposed to get up with assistance and is living in assisted living facility got up felt weak and fell with may have struck her head.  Patient somewhat of a poor historian.  No recent fevers or chills.  No nausea or vomiting.  Past Medical History:  Diagnosis Date   Arthritis    CHF (congestive heart failure) (HCC)    COPD (chronic obstructive pulmonary disease) (HCC)    Coronary artery disease    Hypercholesteremia    Hypertension    Family History  Problem Relation Age of Onset   Aneurysm Father    Lung cancer Brother    Prostate cancer Brother    Heart failure Brother    Past Surgical History:  Procedure Laterality Date   AORTIC VALVE REPLACEMENT  2006   DILATION AND CURETTAGE OF UTERUS  1988   SHOULDER SURGERY  09/1999   SIGMOIDOSCOPY  1992   Patient Active Problem List   Diagnosis Date Noted   Fall 06/30/2020   Hypertension    Coronary artery disease    COPD (chronic obstructive pulmonary disease) (HCC)    Hypertensive urgency    Elevated troponin    PAF (paroxysmal atrial fibrillation) (HCC)    Chronic diastolic CHF (congestive heart failure) (HCC)    Current mild episode of major depressive disorder, unspecified whether recurrent ([place]) 04/25/2020   Advanced age 22/09/2019   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   Blindness 03/23/2020   Atrial fibrillation with RVR ([place]) 03/22/2020   Chronic pain syndrome 03/16/2020   Closed nondisplaced fracture of surgical neck of left humerus 03/16/2020   Chronic left-sided  headaches 03/16/2020   Occipital neuralgia of left side 03/16/2020   Syncope 07/20/2019   Recurrent syncope 07/19/2019   Insomnia 03/18/2016   Visual hallucinations 03/18/2016   Vertigo 03/15/2016   Paroxysmal atrial fibrillation with RVR ([place]) 11/27/2015   Depression 11/27/2015   Adjustment disorder with disturbance of emotion 09/28/2015   Severe aortic stenosis 09/28/2015   Bilateral cataracts 09/28/2015   Cardiac murmur 09/28/2015   Acute renal failure superimposed on stage 3b chronic kidney disease ([place]) 09/28/2015   History of gout 09/28/2015   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/2015   OP (osteoporosis) 09/28/2015   Atherosclerosis of coronary artery 09/08/2015   Avitaminosis D 07/11/2015   LBP (low back pain) 07/11/2015   BP (high blood pressure) 07/11/2015   HLD (hyperlipidemia) 04/12/2015   Billowing mitral valve 08/03/2014   Fibrosis of lung ([place]) 05/25/2014   Asthma-chronic obstructive pulmonary disease overlap syndrome ([place]) 05/25/2014   H/O aortic valve replacement 11/09/2004      Prior to Admission medications   Medication Sig Start Date End Date Taking? Authorizing Provider  escitalopram (LEXAPRO) 20 MG tablet Take 1 tablet (20 mg total) by mouth daily. 03/28/21  Yes Birdie Sons, MD  fexofenadine (ALLEGRA) 180 MG tablet Take 180 mg by mouth daily.   Yes [provider]  Fluticasone-Salmeterol (ADVAIR) 250-50 MCG/DOSE AEPB INHALE ONE PUFF INTO LUNGS EVERY 12 HOURS. RINSE MOUTH AFTER USE 12/20/20  Yes Carles Collet M, PA-C  furosemide (LASIX) 20 MG tablet Take 1 tablet (20 mg total) by mouth daily. 02/13/21  Yes Chrismon, Vickki Muff, PA-C  hydrALAZINE (APRESOLINE) 10 MG tablet TAKE 1 TABLET BY MOUTH 2 TIMES DAILY 04/12/21  Yes Bacigalupo, Dionne Bucy, MD  ipratropium-albuterol (DUONEB) 0.5-2.5 (3) MG/3ML SOLN Take 3 mLs by nebulization 3 (three) times daily. 03/27/20  Yes Swayze, Ava, DO  losartan (COZAAR) 25 MG tablet Take 0.5 tablets (12.5 mg total) by mouth daily.  03/28/21  Yes Birdie Sons, MD  meloxicam (MOBIC) 15 MG tablet Take 15 mg by mouth daily.   Yes [provider]  meloxicam (MOBIC) 7.5 MG tablet Take 7.5 mg by mouth daily. 04/06/21  Yes [provider]  metoprolol succinate (TOPROL-XL) 50 MG 24 hr tablet TAKE 1 TABLET BY MOUTH  DAILY WITH OR IMMEDIATELY  FOLLOWING A MEAL 07/05/20  Yes Pollak, Adriana M, PA-C  acetaminophen (TYLENOL) 500 MG tablet Take 500-1,000 mg by mouth 2 (two) times daily as needed for mild pain, moderate pain, fever or headache.     [provider]  albuterol (PROVENTIL) (2.5 MG/3ML) 0.083% nebulizer solution Take 2.5 mg by nebulization every 8 (eight) hours as needed for wheezing or shortness of breath.     [provider]  budesonide (PULMICORT) 0.5 MG/2ML nebulizer solution Take 0.5 mg by nebulization at bedtime.    [provider]  diclofenac Sodium (VOLTAREN) 1 % GEL Apply topically as needed.  03/29/20   [provider]    Allergies Iodine, Shellfish allergy, Azithromycin, and Sulfa antibiotics    Social History Social History   Tobacco Use   Smoking status: Never   Smokeless tobacco: Never  Substance Use Topics   Alcohol use: No   Drug use: No    Review of Systems Patient denies headaches, rhinorrhea, blurry vision, numbness, shortness of breath, chest pain, edema, cough, abdominal pain, nausea, vomiting, diarrhea, dysuria, fevers, rashes or hallucinations unless otherwise stated above in HPI. ____________________________________________   PHYSICAL EXAM:  VITAL SIGNS: Vitals:   04/18/21 1330 04/18/21 1400  BP:  (!) 225/69  Pulse: 62 61  Resp: 17 18  Temp:    SpO2: 98% 98%    Constitutional: Alert and oriented.  Eyes: Conjunctivae are normal.  Head: Atraumatic. Nose: No congestion/rhinnorhea. Mouth/Throat: Mucous membranes are moist.   Neck: No stridor. Painless ROM.  Cardiovascular: Normal rate, regular rhythm. Grossly normal heart  sounds.  Good peripheral circulation. Respiratory: Normal respiratory effort.  No retractions. Lungs CTAB. Gastrointestinal: Soft and nontender. No distention. No abdominal bruits. No CVA tenderness. Genitourinary:  Musculoskeletal: pain to right hip with log roll. No lower extremity tenderness nor edema.  No joint effusions. Neurologic:  Normal speech and language. No gross focal neurologic deficits are appreciated. No facial droop Skin:  Skin is warm, dry and intact. No rash noted. Psychiatric: Mood and affect are normal. Speech and behavior are normal.  ____________________________________________   LABS (all labs ordered are listed, but only abnormal results are displayed)  Results for orders placed or performed during the hospital encounter of 04/18/21 (from the past 24 hour(s))  CBC     Status: Abnormal   Collection Time: 04/18/21 12:46 PM  Result Value Ref Range   WBC 7.9 4.0 - 10.5 K/uL   RBC 3.63 (L) 3.87 - 5.11 MIL/uL   Hemoglobin 11.2 (L) 12.0 - 15.0 g/dL   HCT 33.5 (L) 36.0 - 46.0 %   MCV 92.3 80.0 - 100.0 fL  MCH 30.9 26.0 - 34.0 pg   MCHC 33.4 30.0 - 36.0 g/dL   RDW 13.4 11.5 - 15.5 %   Platelets 275 150 - 400 K/uL   nRBC 0.0 0.0 - 0.2 %  Comprehensive metabolic panel     Status: Abnormal   Collection Time: 04/18/21 12:46 PM  Result Value Ref Range   Sodium 134 (L) 135 - 145 mmol/L   Potassium 4.5 3.5 - 5.1 mmol/L   Chloride 96 (L) 98 - 111 mmol/L   CO2 29 22 - 32 mmol/L   Glucose, Bld 114 (H) 70 - 99 mg/dL   BUN 39 (H) 8 - 23 mg/dL   Creatinine, Ser 1.57 (H) 0.44 - 1.00 mg/dL   Calcium 9.4 8.9 - 10.3 mg/dL   Total Protein 7.1 6.5 - 8.1 g/dL   Albumin 3.7 3.5 - 5.0 g/dL   AST 16 15 - 41 U/L   ALT 9 0 - 44 U/L   Alkaline Phosphatase 68 38 - 126 U/L   Total Bilirubin 0.7 0.3 - 1.2 mg/dL   GFR, Estimated 30 (L) >60 mL/min   Anion gap 9 5 - 15  Urinalysis, Complete w Microscopic Urine, Clean Catch     Status: Abnormal   Collection Time: 04/18/21 12:46 PM   Result Value Ref Range   Color, Urine STRAW (A) YELLOW   APPearance CLEAR (A) CLEAR   Specific Gravity, Urine 1.006 1.005 - 1.030   pH 5.0 5.0 - 8.0   Glucose, UA NEGATIVE NEGATIVE mg/dL   Hgb urine dipstick NEGATIVE NEGATIVE   Bilirubin Urine NEGATIVE NEGATIVE   Ketones, ur NEGATIVE NEGATIVE mg/dL   Protein, ur 30 (A) NEGATIVE mg/dL   Nitrite NEGATIVE NEGATIVE   Leukocytes,Ua NEGATIVE NEGATIVE   RBC / HPF 0-5 0 - 5 RBC/hpf   WBC, UA 0-5 0 - 5 WBC/hpf   Bacteria, UA NONE SEEN NONE SEEN   Squamous Epithelial / LPF NONE SEEN 0 - 5   Mucus PRESENT    ____________________________________________ ____________________________________________  RADIOLOGY  I personally reviewed all radiographic images ordered to evaluate for the above acute complaints and reviewed radiology reports and findings.  These findings were personally discussed with the patient.  Please see medical record for radiology report.  ____________________________________________   PROCEDURES  Procedure(s) performed:  Procedures    Critical Care performed: no ____________________________________________   INITIAL IMPRESSION / ASSESSMENT AND PLAN / ED COURSE  Pertinent labs & imaging results that were available during my care of the patient were reviewed by me and considered in my medical decision making (see chart for details).   DDX: fracture, contusion, dislocation  CAROLLYNN PENNYWELL is a 87 y.o. who presents to the ED with symptoms as described above.  Patient elderly frail woman with frequent falls exposed to be walking with a walker had fall again last night with imaging concerning for pubic rami fracture.  Her pain is controlled denies any other associated pain.  Family requesting discussion with social work for possible increased level of care.  Will consult social work as well as PT.  Clinical Course as of 04/18/21 1523  Wed Apr 18, 2021  1327 X-ray does show evidence of pubic rami fracture.  No sign  of hip fracture.   [PR]    Clinical Course User Index [PR] Merlyn Lot, MD   PT would not evaluate patient due to blood pressure being too high however she is otherwise asymptomatic I do not feel that she needs to stay in  the hospital for this.  Given her home antihypertensive medications discussed case with the patient's niece who states that she is already working on getting a higher level of care and has requested referral to orthopedics.  I do not see any indication for hospitalization at this time.    The patient was evaluated in Emergency Department today for the symptoms described in the history of present illness. He/she was evaluated in the context of the global COVID-19 pandemic, which necessitated consideration that the patient might be at risk for infection with the SARS-CoV-2 virus that causes COVID-19. Institutional protocols and algorithms that pertain to the evaluation of patients at risk for COVID-19 are in a state of rapid change based on information released by regulatory bodies including the CDC and federal and state organizations. These policies and algorithms were followed during the patient's care in the ED.  As part of my medical decision making, I reviewed the following data within the [facility] notes reviewed and incorporated, Labs reviewed, notes from prior ED visits and [place] Controlled Substance Database   ____________________________________________   FINAL CLINICAL IMPRESSION(S) / ED DIAGNOSES  Final diagnoses:  Closed fracture of multiple pubic rami, initial encounter ([place])      NEW MEDICATIONS STARTED DURING THIS VISIT:  New Prescriptions   No medications on file     Note:  This document was prepared using Dragon voice recognition software and may include unintentional dictation errors.    Merlyn Lot, MD 04/18/21 [phone]

## 2021-04-19 ENCOUNTER — Non-Acute Institutional Stay: Payer: Medicare Other

## 2021-04-19 VITALS — BP 152/62 | HR 60 | Temp 97.8°F | Resp 22

## 2021-04-19 DIAGNOSIS — Z515 Encounter for palliative care: Secondary | ICD-10-CM

## 2021-04-19 NOTE — Progress Notes (Signed)
Follow up visit completed after ED discharge.  Patient sustained a fall yesterday, hitting her head and also complaining of right hip pain.  X-ray results reveal pubic ramus fracture.  Patient with elevated BP in the ED requiring hydralazine.  Patient found in the bed awake.  Denies any pain at present.  Currently bed-bound status and calling for staff to assist with ADL's.  Patient offered water and accepts.  Facility reports a call pendant has been obtained for patient and maintenance will program.  Niece is seeking placement in SNF.  Currently looking a Radiation protection practitioner for LTC placement given decline and current level of care.  Palliative Care SW working with niece to assist with placement.    Vital signs obtained and blood pressure at baseline.   Patient without complaints at this time.  Re-enforced using call light for assistance.  Spoke with Denny Peon, LPN for patient.  No new concerns voiced at this time.

## 2021-04-24 ENCOUNTER — Non-Acute Institutional Stay: Payer: Medicare Other

## 2021-04-24 ENCOUNTER — Other Ambulatory Visit: Payer: Self-pay

## 2021-04-24 ENCOUNTER — Telehealth: Payer: Self-pay

## 2021-04-24 DIAGNOSIS — Z515 Encounter for palliative care: Secondary | ICD-10-CM

## 2021-04-24 NOTE — Progress Notes (Signed)
TELEHEALTH VISIT STATEMENT Due to the COVID-19 crisis, this visit was done via telemedicine from my office and it was initiated and consent by this patient and or family.  Visit completed with Wilson Singer, Lorraine Lax, RN, Doreene Eland, SW and this Probation officer.  Patient has sustained another fall during the night.  Staff found patient without clothing on in the floor.  No apparent injuries were reported.  U/A was completed and final results are pending.  Urinalysis show trace of leukocytes.  Niece is requesting a broad-spectrum antibiotic until culture comes back.  Chair and bed alarms are prohibited in [place].  A mattress may possibly be allowed at the bedside to help prevent injury from falls but this would need to be approved by management.  Should falls continue facility would expect a private sitter to be in place during the times most of the falls are occurring.  Bedside commode will be obtained for patient to limit her attempting to walk to the bathroom.  Patient must have a dx of Dementia before she could move to Memory Care at [facility].  Facility requires 18 months of private pay before accepting Medicaid for patients. Development worker, international aid would have to approve patient to remain in the facility under Medicaid.  Niece would like to proceed with SNF placement as she would like to minimize the amount of moving that patient needs to endure.  [facility] and [facility] or current choices that niece would like to look at.  [place], SW will contact facilities to check bed availability.   Facility will have Desiree, NP with [employer] complete FL-2 form and she will email this to [place] call with [place], SW made to [place].  There is one long term care bed available but the FL-2 form would need to be reviewed before a bed offer could be made.  Niece has been notified of above.  She will ensure form is completed and emailed to [place], [place].  Time 1-215 pm.

## 2021-04-24 NOTE — Telephone Encounter (Signed)
04/24/21 @1pm : Palliative care SW and RN met with patients niece, Sharyn Lull, and ALF RN, Jonelle Sidle, to discuss patients overall medical condition/decline  Patient has had multiple falls over the past month with ED visits. Patients cognition has decreased as well as mobility.   Family would like to pursue SNF placement. SW outreached Leslie at [facility] and [facility] and rehab to inquire about bed availability, per family request. Janeece Riggers has bed open and may be willing to make bed offer, after review of FL2. SW [facility] as well, and awaiting response back. Family aware o both outreaches and responses.

## 2021-04-25 ENCOUNTER — Telehealth: Payer: Self-pay

## 2021-04-25 NOTE — Telephone Encounter (Signed)
04/25/21 @ 745 AM: Palliative care SW faxed completed FL2 to [facility] care.

## 2021-04-30 ENCOUNTER — Telehealth: Payer: Self-pay

## 2021-04-30 NOTE — Telephone Encounter (Signed)
SNF PLACEMENT CALLS/OUTREACHES   04/27/21 @10 :08 AM: Palliative care SW Diona Foley at [facility] to inquire of bed offer for patient, SW was told that they are still reviewing FL2 - no decision made.  04/27/21 @1130  AM: Palliative care SW outreached [facility] about bed availability. Call unsuccessful. Lines busy.   04/27/21 @11 :35: [facility] relayed they do not have any LTC beds available.   04/30/21 @930  AM: Palliative care SW outreached [facility] about bed availability. Call unsuccessful. Lines busy.

## 2021-05-01 ENCOUNTER — Telehealth: Payer: Self-pay

## 2021-05-01 ENCOUNTER — Other Ambulatory Visit: Payer: Self-pay

## 2021-05-01 ENCOUNTER — Non-Acute Institutional Stay: Payer: Medicare Other

## 2021-05-01 DIAGNOSIS — Z515 Encounter for palliative care: Secondary | ICD-10-CM

## 2021-05-01 NOTE — Telephone Encounter (Signed)
Copied from [place] [phone]. Topic: General - Other >> May 01, 2021 10:58 AM Oneta Rack wrote: Osvaldo Human name: Vance Gather A  Relation to pt: RN from [place]  Call back number: [phone]   Reason for call:  Requesting orders to transfer patient to [facility] and will PCP be attending physician.

## 2021-05-01 NOTE — Progress Notes (Signed)
PATIENT NAME: Marilyn Oconnell DOB: 1924-04-20 MRN: [id number]  PRIMARY CARE PROVIDER: Margo Common, PA-C  RESPONSIBLE PARTY:  Acct ID - Guarantor Home Phone Work Phone Relationship Acct Type  [id number] Cecilio [phone]  Self P/F     [address], [place], [place] [address]    PLAN OF CARE and INTERVENTIONS:               1.  GOALS OF CARE/ ADVANCE CARE PLANNING:  Still attempting SNF placement.               2.  PATIENT/CAREGIVER EDUCATION:  SNF placement and hospice.               4. PERSONAL EMERGENCY PLAN:  Activate 911 for emergencies.               5.  DISEASE STATUS:  Care plan meeting held with Lorraine Lax, facility RN and [facility].  Patient with increase confusion that started over the weekend.  Patient with tearfulness and confusion during the night.  Staff reported patient was awake most of the night.  Patient is found in the bed and lethargic.  She awakens briefly to tactile and verbal stimulation but does not know her niece. U/A and C&S has been ordered but not collected as of now.  Last U/A completed had a culture that showed contamination.   Patient has completed a 5 day round of Cipro effective 8/7.   Follow up calls completed by niece to [facility].  Facility is unable to accept patient due to the level of care she is requiring.  Niece has scheduled an appt with [facility] for tomorrow and SW and faxed the [facility] form.   [facility] has a waiting list of 1-2 years.  Niece has requested that our PC SW begin looking a facilities in [place] that would be able to accommodate patient.  [place] Henrene Pastor, SW has been notified.   PCP has been contacted regarding a hospice referral.  Niece would like hospice support however is concerned patient may not be accepted into SNF while under this program..  We will delay hospice support until she visits [facility] tomorrow.    HISTORY OF PRESENT ILLNESS:  87 year old female with CHF.  Patient is being  followed by Palliative Care monthly and PRN.  CODE STATUS: DNR ADVANCED DIRECTIVES: N MOST FORM: No PPS: 30%       Lorenza Burton, RN

## 2021-05-01 NOTE — Telephone Encounter (Signed)
05/01/21 @1020  AM: palliative care SW outreached [facility].  SW spoke with Liliane Channel, Admissions coordinator, who shared that they do have a LTC female bed available. SW emailed patients  FL2 to [employer] at [email or web address], for review. Awaiting response.

## 2021-05-03 NOTE — Telephone Encounter (Signed)
Bobby Rumpf, RN from Palliative Care called and advised of Dr. Alben Spittle note below on 05/02/21. She says since her call yesterday the patient was sent to [facility]. She says her niece Sharyn Lull decided to wait on hospice and transfer her to [facility] in [place]. She says there is a bed available at that facility and they are waiting on the paperwork to be transferred over to [place] from [place]. She says once the patient is at the new facility the doctor there will write the order for hospice if the patient's niece decides to go that way.    Jerrol Banana., MD  [place], Tawanna Solo, CMA; Rosanna Randy Nurse Yesterday (8:43 AM)   Hospice MD

## 2021-05-09 ENCOUNTER — Non-Acute Institutional Stay: Payer: Medicare Other

## 2021-05-09 DIAGNOSIS — Z515 Encounter for palliative care: Secondary | ICD-10-CM

## 2021-05-09 NOTE — Progress Notes (Signed)
PATIENT NAME: Marilyn Oconnell DOB: [birth year] MRN: [id number]  PRIMARY CARE PROVIDER: Margo Common, PA-C  RESPONSIBLE PARTY:  Acct ID - Guarantor Home Phone Work Phone Relationship Acct Type  [id number] Cecilio [phone]  Self P/F     [place], [place], [place] [address]   TELEHEALTH VISIT STATEMENT Due to the COVID-19 crisis, this visit was done via telemedicine from my office and it was initiated and consent by this patient and or family.   PLAN OF CARE and INTERVENTIONS:               1.  GOALS OF CARE/ ADVANCE CARE PLANNING:  Comfort measures with SNF placement.                2.  PATIENT/CAREGIVER EDUCATION:  Palliative Care               4. PERSONAL EMERGENCY PLAN:  Activate 911 for emergencies.               5.  DISEASE STATUS:  Patient is scheduled to see neurology on August 25 th.  Michelle-niece is hoping this will shed more light on the memory issues patient is experiencing.  Patient does not always remember her family members.  Niece notes she continues to see a cognitive decline in patient.  Functionally she has improved and is able to bear some weight to her right leg.  Pain has improved to this extremity.  Private duty staff continue to see patient daily to assist in personal care.   Sharyn Lull believes she will need the hospital bed moved to the facility.  The room she toured was empty.  I have advised her to speak with someone at the facility regarding the hospital bed as they typically provide this.  Sharyn Lull will reach out to the facility tomorrow to follow up on this.  Patient is scheduled to move next Friday.  Niece would like to continue with Palliative Care services.  Advised that once patient is admitted to the facility we could request a Palliative Care consult.   Niece will update the team once patient is admitted.    HISTORY OF PRESENT ILLNESS:  87 year old female with hx of CHF.  Patient is being followed by Palliative Care monthly and PRN.  CODE  STATUS: DNR ADVANCED DIRECTIVES: No MOST FORM: No PPS: 40%        Lorenza Burton, RN

## 2021-05-11 ENCOUNTER — Other Ambulatory Visit: Payer: Self-pay

## 2021-05-15 ENCOUNTER — Encounter: Payer: Self-pay | Admitting: Internal Medicine

## 2021-05-15 ENCOUNTER — Emergency Department: Payer: Medicare Other

## 2021-05-15 ENCOUNTER — Other Ambulatory Visit: Payer: Self-pay

## 2021-05-15 ENCOUNTER — Inpatient Hospital Stay
Admission: EM | Admit: 2021-05-15 | Discharge: 2021-05-21 | DRG: 480 | Disposition: A | Discharge status: Skilled Nursing Facility | Payer: Medicare Other | Attending: Internal Medicine | Admitting: Internal Medicine

## 2021-05-15 DIAGNOSIS — I130 Hypertensive heart and chronic kidney disease with heart failure and stage 1 through stage 4 chronic kidney disease, or unspecified chronic kidney disease: Secondary | ICD-10-CM | POA: Diagnosis present

## 2021-05-15 DIAGNOSIS — E785 Hyperlipidemia, unspecified: Secondary | ICD-10-CM | POA: Diagnosis present

## 2021-05-15 DIAGNOSIS — H269 Unspecified cataract: Secondary | ICD-10-CM | POA: Diagnosis not present

## 2021-05-15 DIAGNOSIS — Z882 Allergy status to sulfonamides status: Secondary | ICD-10-CM

## 2021-05-15 DIAGNOSIS — J449 Chronic obstructive pulmonary disease, unspecified: Secondary | ICD-10-CM

## 2021-05-15 DIAGNOSIS — Z7951 Long term (current) use of inhaled steroids: Secondary | ICD-10-CM

## 2021-05-15 DIAGNOSIS — Z01818 Encounter for other preprocedural examination: Secondary | ICD-10-CM | POA: Diagnosis not present

## 2021-05-15 DIAGNOSIS — I959 Hypotension, unspecified: Secondary | ICD-10-CM | POA: Diagnosis not present

## 2021-05-15 DIAGNOSIS — W1830XA Fall on same level, unspecified, initial encounter: Secondary | ICD-10-CM | POA: Diagnosis present

## 2021-05-15 DIAGNOSIS — W19XXXA Unspecified fall, initial encounter: Principal | ICD-10-CM

## 2021-05-15 DIAGNOSIS — Z6821 Body mass index (BMI) 21.0-21.9, adult: Secondary | ICD-10-CM | POA: Diagnosis not present

## 2021-05-15 DIAGNOSIS — F0390 Unspecified dementia without behavioral disturbance: Secondary | ICD-10-CM | POA: Diagnosis present

## 2021-05-15 DIAGNOSIS — Z888 Allergy status to other drugs, medicaments and biological substances status: Secondary | ICD-10-CM | POA: Diagnosis not present

## 2021-05-15 DIAGNOSIS — S72009A Fracture of unspecified part of neck of unspecified femur, initial encounter for closed fracture: Secondary | ICD-10-CM | POA: Diagnosis present

## 2021-05-15 DIAGNOSIS — Z66 Do not resuscitate: Secondary | ICD-10-CM | POA: Diagnosis present

## 2021-05-15 DIAGNOSIS — I350 Nonrheumatic aortic (valve) stenosis: Secondary | ICD-10-CM | POA: Diagnosis not present

## 2021-05-15 DIAGNOSIS — N184 Chronic kidney disease, stage 4 (severe): Secondary | ICD-10-CM | POA: Diagnosis present

## 2021-05-15 DIAGNOSIS — Z20822 Contact with and (suspected) exposure to covid-19: Secondary | ICD-10-CM | POA: Diagnosis present

## 2021-05-15 DIAGNOSIS — E7800 Pure hypercholesterolemia, unspecified: Secondary | ICD-10-CM | POA: Diagnosis present

## 2021-05-15 DIAGNOSIS — I10 Essential (primary) hypertension: Secondary | ICD-10-CM | POA: Diagnosis not present

## 2021-05-15 DIAGNOSIS — N179 Acute kidney failure, unspecified: Secondary | ICD-10-CM

## 2021-05-15 DIAGNOSIS — Z91013 Allergy to seafood: Secondary | ICD-10-CM

## 2021-05-15 DIAGNOSIS — Z8249 Family history of ischemic heart disease and other diseases of the circulatory system: Secondary | ICD-10-CM

## 2021-05-15 DIAGNOSIS — S72144A Nondisplaced intertrochanteric fracture of right femur, initial encounter for closed fracture: Principal | ICD-10-CM

## 2021-05-15 DIAGNOSIS — F3289 Other specified depressive episodes: Secondary | ICD-10-CM | POA: Diagnosis not present

## 2021-05-15 DIAGNOSIS — Z791 Long term (current) use of non-steroidal anti-inflammatories (NSAID): Secondary | ICD-10-CM

## 2021-05-15 DIAGNOSIS — N189 Chronic kidney disease, unspecified: Secondary | ICD-10-CM

## 2021-05-15 DIAGNOSIS — E43 Unspecified severe protein-calorie malnutrition: Secondary | ICD-10-CM | POA: Diagnosis present

## 2021-05-15 DIAGNOSIS — I5032 Chronic diastolic (congestive) heart failure: Secondary | ICD-10-CM

## 2021-05-15 DIAGNOSIS — R54 Age-related physical debility: Secondary | ICD-10-CM | POA: Diagnosis present

## 2021-05-15 DIAGNOSIS — D72828 Other elevated white blood cell count: Secondary | ICD-10-CM | POA: Diagnosis not present

## 2021-05-15 DIAGNOSIS — Z419 Encounter for procedure for purposes other than remedying health state, unspecified: Secondary | ICD-10-CM

## 2021-05-15 DIAGNOSIS — Z79899 Other long term (current) drug therapy: Secondary | ICD-10-CM

## 2021-05-15 DIAGNOSIS — E871 Hypo-osmolality and hyponatremia: Secondary | ICD-10-CM

## 2021-05-15 DIAGNOSIS — Z953 Presence of xenogenic heart valve: Secondary | ICD-10-CM

## 2021-05-15 DIAGNOSIS — I2510 Atherosclerotic heart disease of native coronary artery without angina pectoris: Secondary | ICD-10-CM | POA: Diagnosis present

## 2021-05-15 DIAGNOSIS — Z515 Encounter for palliative care: Secondary | ICD-10-CM

## 2021-05-15 DIAGNOSIS — I480 Paroxysmal atrial fibrillation: Secondary | ICD-10-CM | POA: Diagnosis present

## 2021-05-15 DIAGNOSIS — Z880 Allergy status to penicillin: Secondary | ICD-10-CM

## 2021-05-15 DIAGNOSIS — H548 Legal blindness, as defined in USA: Secondary | ICD-10-CM | POA: Diagnosis present

## 2021-05-15 DIAGNOSIS — H543 Unqualified visual loss, both eyes: Secondary | ICD-10-CM | POA: Diagnosis not present

## 2021-05-15 DIAGNOSIS — D62 Acute posthemorrhagic anemia: Secondary | ICD-10-CM | POA: Diagnosis not present

## 2021-05-15 DIAGNOSIS — Y92009 Unspecified place in unspecified non-institutional (private) residence as the place of occurrence of the external cause: Secondary | ICD-10-CM

## 2021-05-15 DIAGNOSIS — H547 Unspecified visual loss: Secondary | ICD-10-CM

## 2021-05-15 DIAGNOSIS — F32A Depression, unspecified: Secondary | ICD-10-CM | POA: Diagnosis present

## 2021-05-15 DIAGNOSIS — S72001A Fracture of unspecified part of neck of right femur, initial encounter for closed fracture: Secondary | ICD-10-CM | POA: Diagnosis not present

## 2021-05-15 LAB — CBC WITH DIFFERENTIAL/PLATELET
Abs Immature Granulocytes: 0.04 10*3/uL (ref 0.00–0.07)
Basophils Absolute: 0.1 10*3/uL (ref 0.0–0.1)
Basophils Relative: 0 %
Eosinophils Absolute: 0 10*3/uL (ref 0.0–0.5)
Eosinophils Relative: 0 %
HCT: 30.8 % — ABNORMAL LOW (ref 36.0–46.0)
Hemoglobin: 10.7 g/dL — ABNORMAL LOW (ref 12.0–15.0)
Immature Granulocytes: 0 %
Lymphocytes Relative: 4 %
Lymphs Abs: 0.5 10*3/uL — ABNORMAL LOW (ref 0.7–4.0)
MCH: 31.9 pg (ref 26.0–34.0)
MCHC: 34.7 g/dL (ref 30.0–36.0)
MCV: 91.9 fL (ref 80.0–100.0)
Monocytes Absolute: 0.4 10*3/uL (ref 0.1–1.0)
Monocytes Relative: 4 %
Neutro Abs: 11.3 10*3/uL — ABNORMAL HIGH (ref 1.7–7.7)
Neutrophils Relative %: 92 %
Platelets: 205 10*3/uL (ref 150–400)
RBC: 3.35 MIL/uL — ABNORMAL LOW (ref 3.87–5.11)
RDW: 13.2 % (ref 11.5–15.5)
WBC: 12.3 10*3/uL — ABNORMAL HIGH (ref 4.0–10.5)
nRBC: 0 % (ref 0.0–0.2)

## 2021-05-15 LAB — BASIC METABOLIC PANEL
Anion gap: 10 (ref 5–15)
BUN: 43 mg/dL — ABNORMAL HIGH (ref 8–23)
CO2: 25 mmol/L (ref 22–32)
Calcium: 9.2 mg/dL (ref 8.9–10.3)
Chloride: 95 mmol/L — ABNORMAL LOW (ref 98–111)
Creatinine, Ser: 2.01 mg/dL — ABNORMAL HIGH (ref 0.44–1.00)
GFR, Estimated: 22 mL/min — ABNORMAL LOW (ref >60)
Glucose, Bld: 155 mg/dL — ABNORMAL HIGH (ref 70–99)
Potassium: 4.3 mmol/L (ref 3.5–5.1)
Sodium: 130 mmol/L — ABNORMAL LOW (ref 135–145)

## 2021-05-15 LAB — SURGICAL PCR SCREEN
MRSA, PCR: NEGATIVE
Staphylococcus aureus: POSITIVE — AB

## 2021-05-15 LAB — URINALYSIS, COMPLETE (UACMP) WITH MICROSCOPIC
Bilirubin Urine: NEGATIVE
Glucose, UA: NEGATIVE mg/dL
Ketones, ur: NEGATIVE mg/dL
Leukocytes,Ua: NEGATIVE
Nitrite: NEGATIVE
Protein, ur: NEGATIVE mg/dL
Specific Gravity, Urine: 1.006 (ref 1.005–1.030)
pH: 5 (ref 5.0–8.0)

## 2021-05-15 LAB — CK: Total CK: 73 U/L (ref 38–234)

## 2021-05-15 LAB — RESP PANEL BY RT-PCR (FLU A&B, COVID) ARPGX2
Influenza A by PCR: NEGATIVE
Influenza B by PCR: NEGATIVE
SARS Coronavirus 2 by RT PCR: NEGATIVE

## 2021-05-15 IMAGING — CT CT CERVICAL SPINE W/O CM
2 series · 11 of 27 positions shown, 14 images · non-contrast
Comparison: [DATE]

CLINICAL DATA: Polytrauma. Critical. Head and cervical spine
injury. Fell at facility.

EXAM:
CT HEAD WITHOUT CONTRAST
CT CERVICAL SPINE WITHOUT CONTRAST
TECHNIQUE: Multidetector CT imaging of the head and cervical spine was
performed following the standard protocol without intravenous
contrast. Multiplanar CT image reconstructions of the cervical spine
were also generated.

[Series 3: c spine soft · axial · 0.42mm/px · z∈[-238,-118]mm · 6 of 80 slices shown, 8 images]
[im 13/80  soft-tissue]
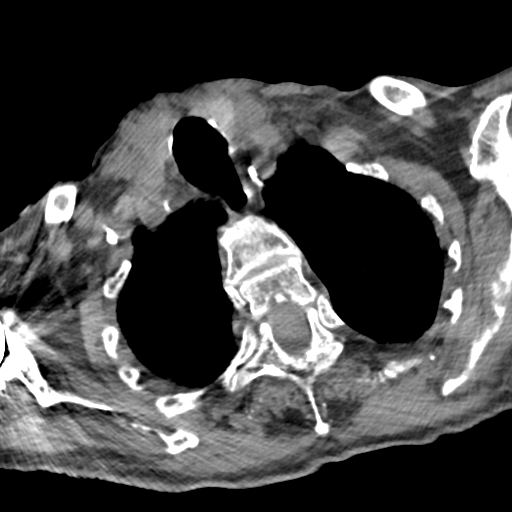
[im 13/80  bone]
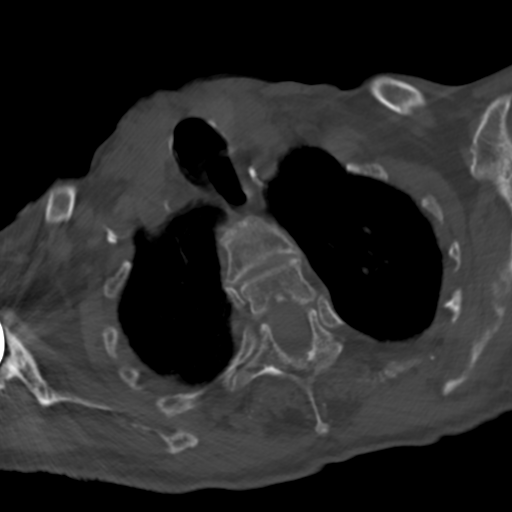
[im 25/80  bone]
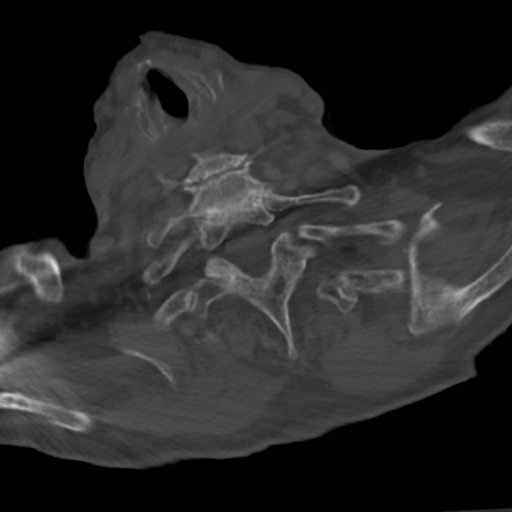
[im 37/80  bone]
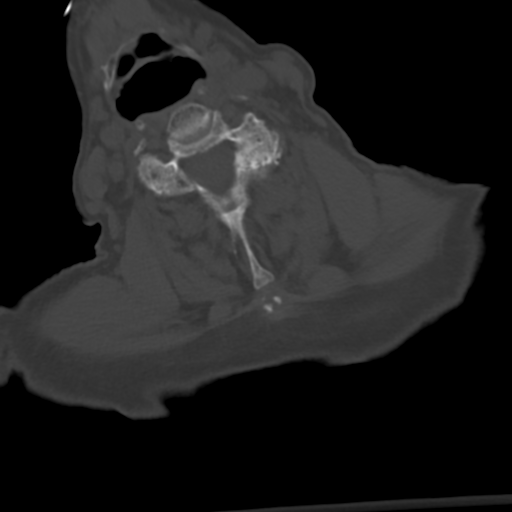
[im 49/80  bone]
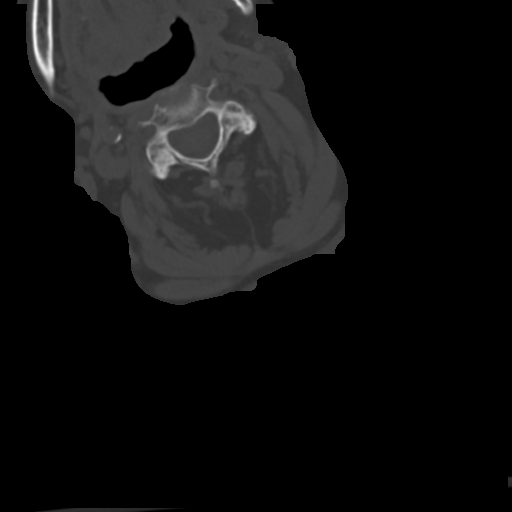
[im 61/80  soft-tissue]
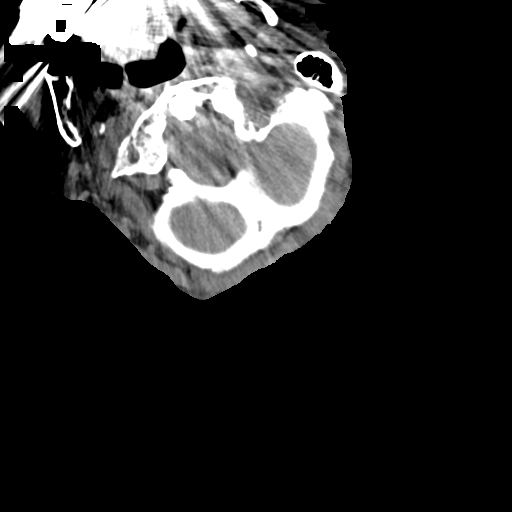
[im 61/80  bone]
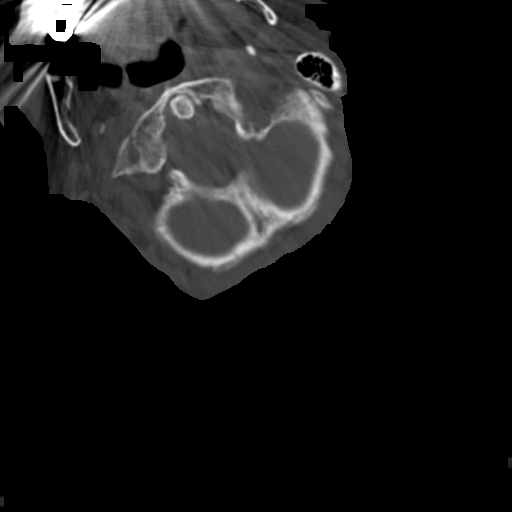
[im 73/80  bone]
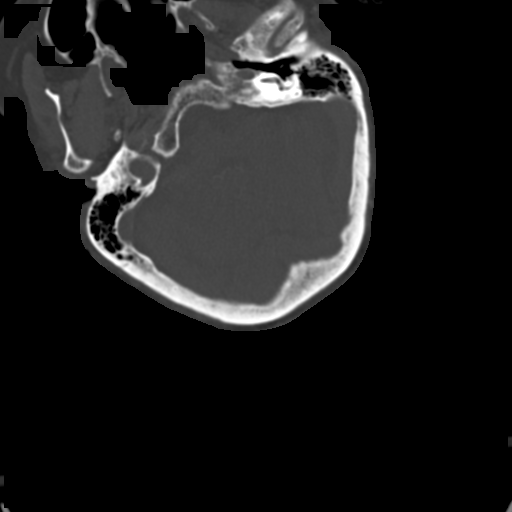

[Series 6: sagittal bone · sagittal · 0.26mm/px · 5 of 52 slices shown, 6 images]
[im 18/52  bone]
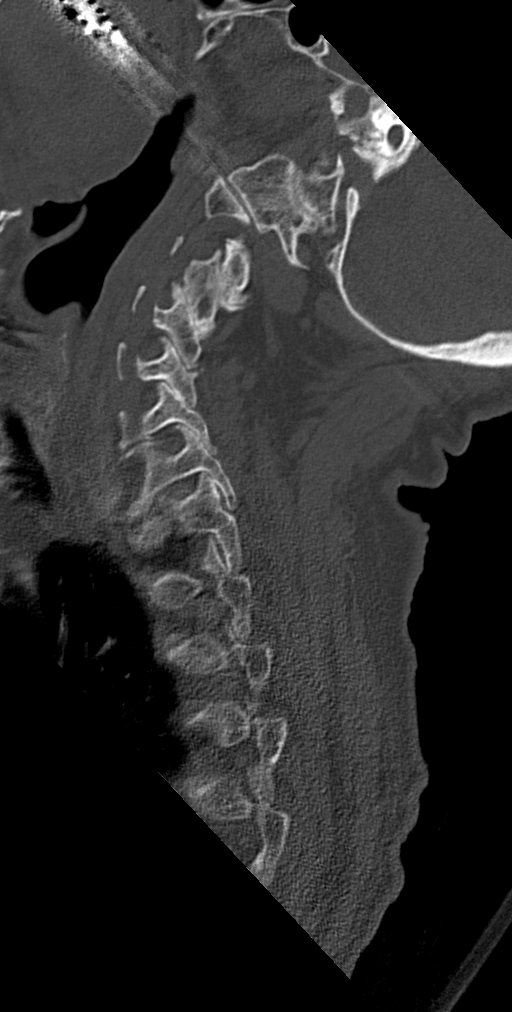
[im 22/52  bone]
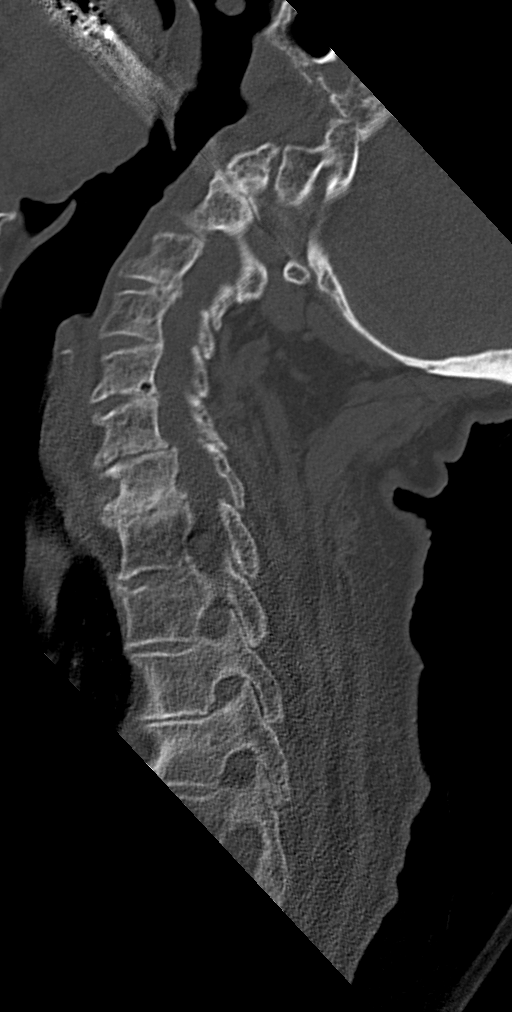
[im 26/52  soft-tissue]
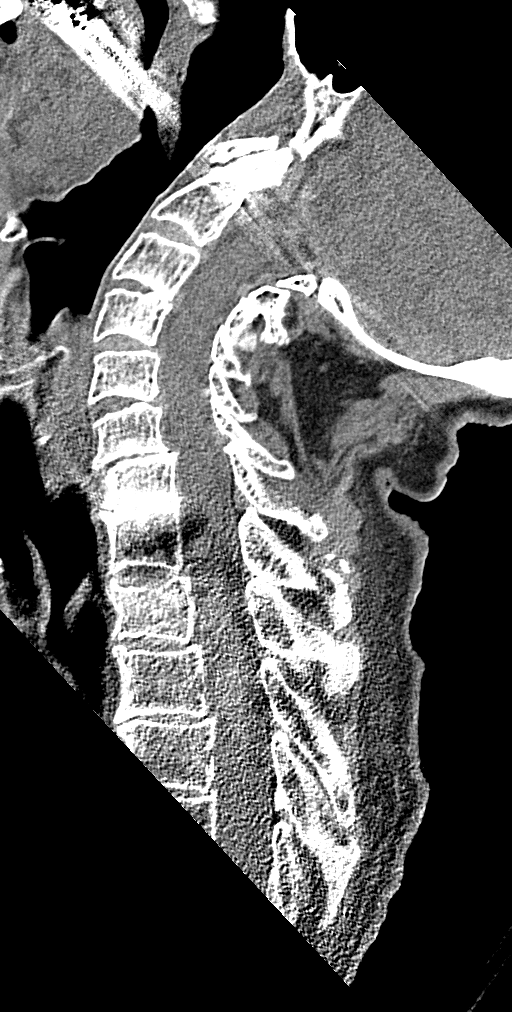
[im 26/52  bone]
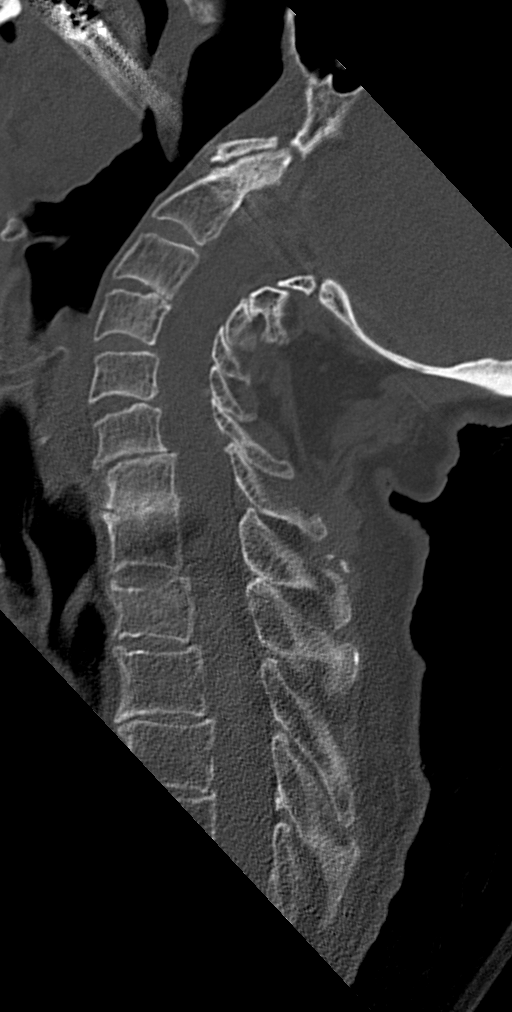
[im 30/52  bone]
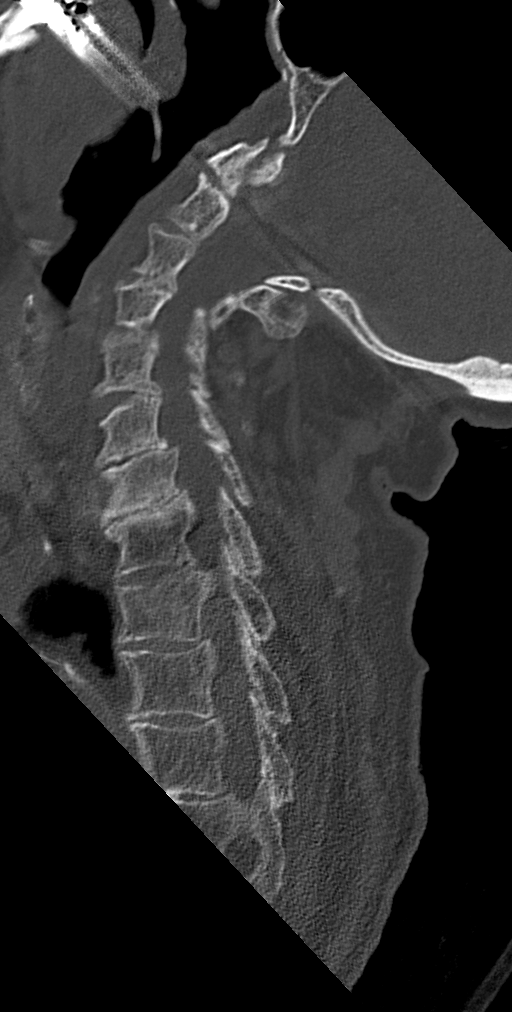
[im 35/52  bone]
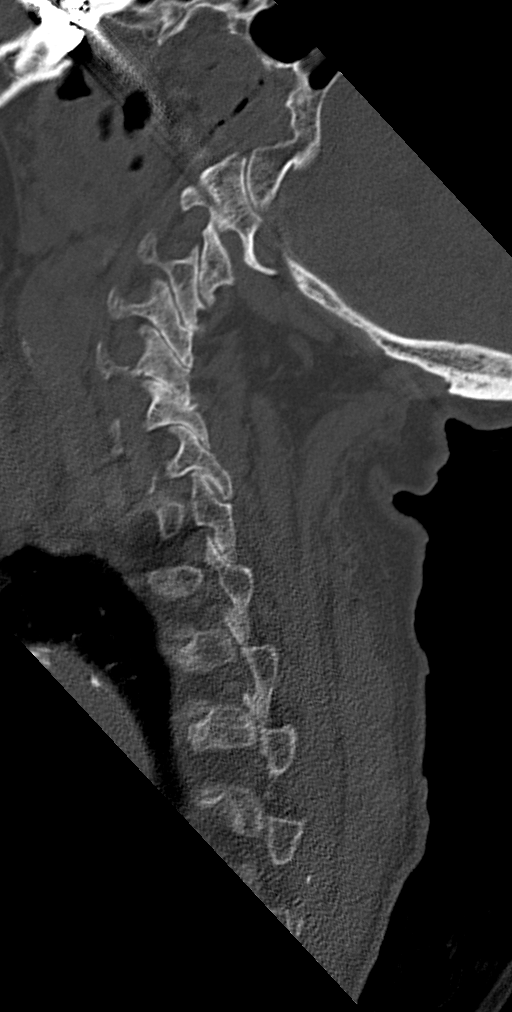

[11 of 27 positions shown; findings below may reference images not displayed]

FINDINGS: CT HEAD FINDINGS

Brain: Age related atrophy. Extensive chronic small-vessel ischemic
changes of the white matter. No sign acute infarction, mass lesion,
hemorrhage, hydrocephalus or extra-axial collection.

Vascular: There is atherosclerotic calcification of the major
vessels at the base of the brain.

Skull: No skull fracture.

Sinuses/Orbits: No fluid in the sinuses. No evidence of orbital
injury.

Other: None

CT CERVICAL SPINE FINDINGS

Alignment: Exaggerated cervical lordosis. 2-3 mm degenerative
anterolisthesis at C6-7, unchanged from prior.

Skull base and vertebrae: No regional fracture.

Soft tissues and spinal canal: No evidence of traumatic soft tissue
finding.

Disc levels: Degenerative disc disease and degenerative facet
disease throughout the region. Moderate bony foraminal stenosis,
unchanged from previous studies.

Upper chest: Negative

Other: None
IMPRESSION: Head CT: Atrophy and chronic small-vessel ischemic changes. No acute
or traumatic finding

Cervical spine CT: Chronic degenerative changes, stable over prior
is scratch that is stable since prior exams. No acute or traumatic
finding.

## 2021-05-15 IMAGING — CT CT HEAD W/O CM
4 of 5 series · 14 of 47 positions shown, 16 images · non-contrast
Comparison: [DATE]

CLINICAL DATA: Polytrauma. Critical. Head and cervical spine
injury. Fell at facility.

EXAM:
CT HEAD WITHOUT CONTRAST
CT CERVICAL SPINE WITHOUT CONTRAST
TECHNIQUE: Multidetector CT imaging of the head and cervical spine was
performed following the standard protocol without intravenous
contrast. Multiplanar CT image reconstructions of the cervical spine
were also generated.

[Series 3: head wo · axial · 0.45mm/px · z∈[-112,-47]mm · 3 of 33 slices shown]
[im 7/33  brain]
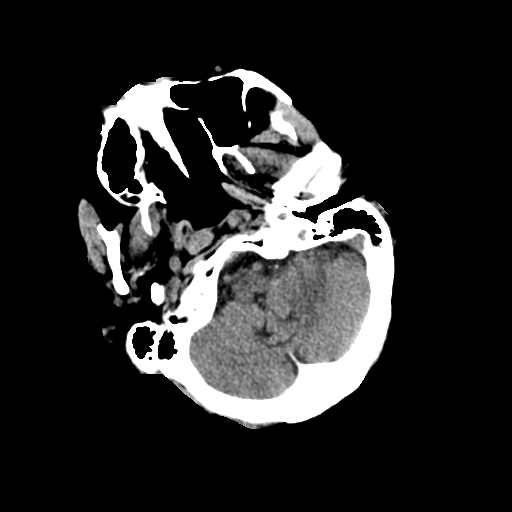
[im 13/33  brain]
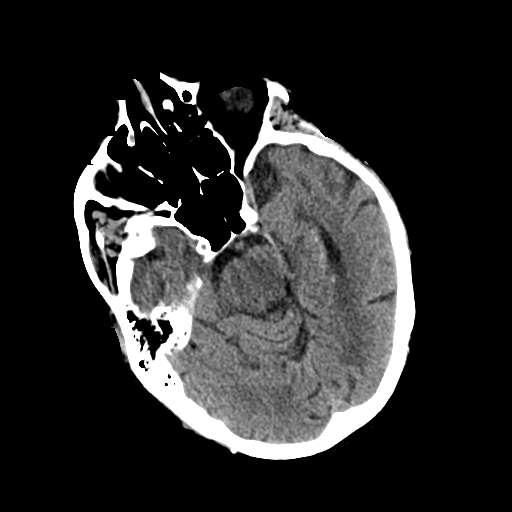
[im 20/33  brain]
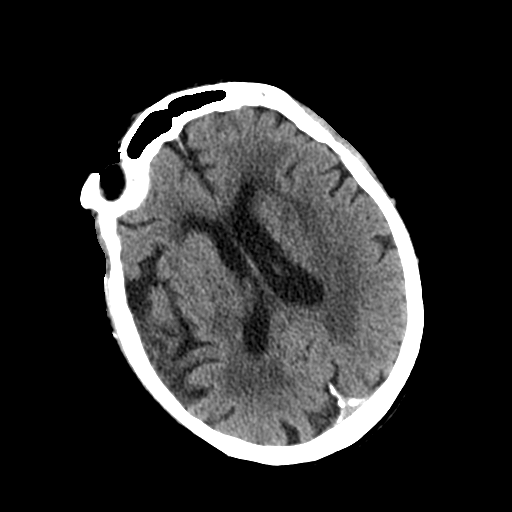

[Series 4: coronal soft tissue · coronal · 0.36mm/px · 3 of 72 slices shown]
[im 24/72  brain]
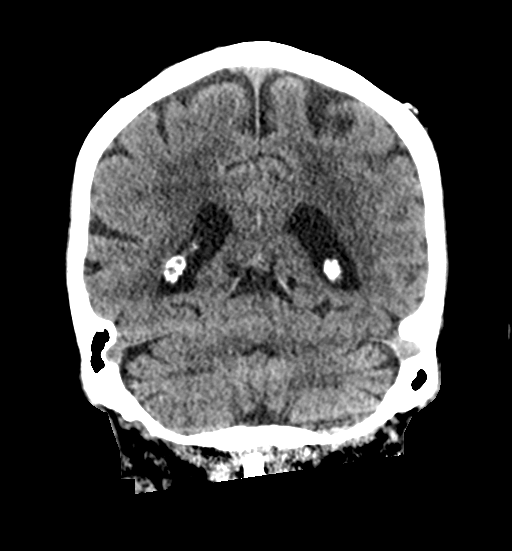
[im 32/72  brain]
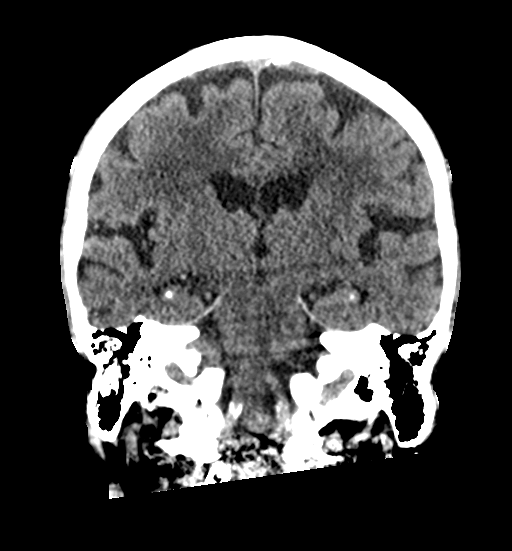
[im 40/72  brain]
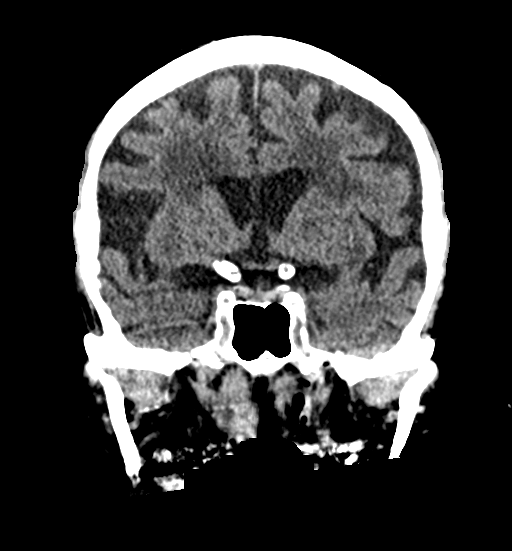

[Series 5: sagittal soft tissue · sagittal · 0.38mm/px · 3 of 54 slices shown]
[im 18/54  brain]
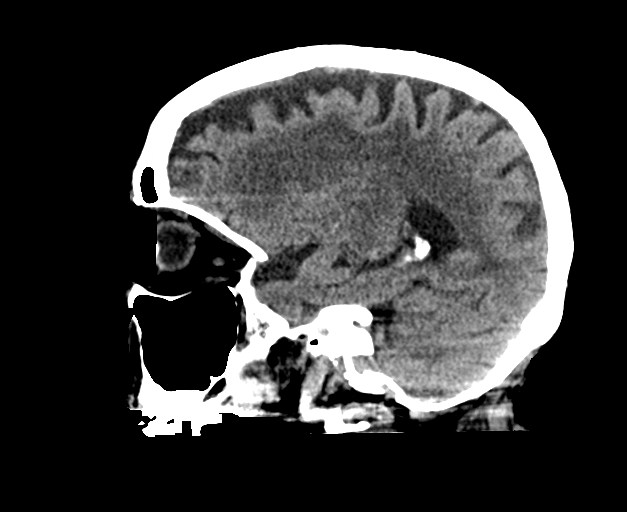
[im 27/54  brain]
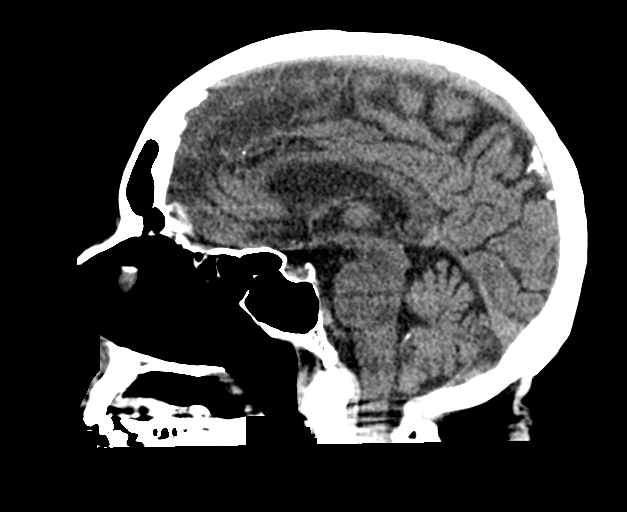
[im 36/54  brain]
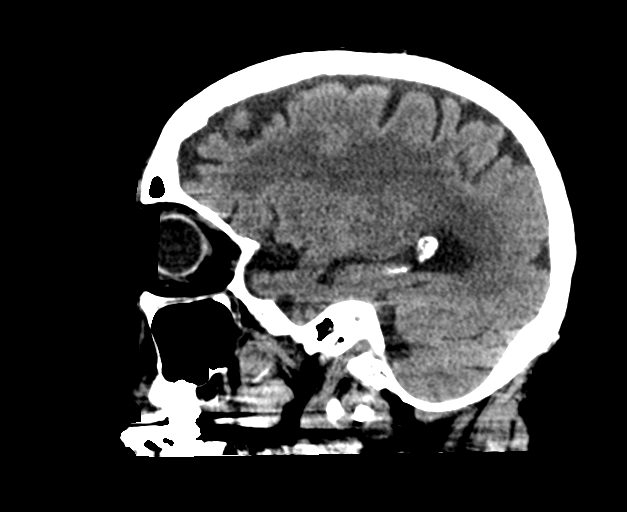

[Series 6: ax head wo recon · axial · 0.33mm/px · z∈[-85,+25]mm · 5 of 38 slices shown, 7 images]
[im 7/38  brain]
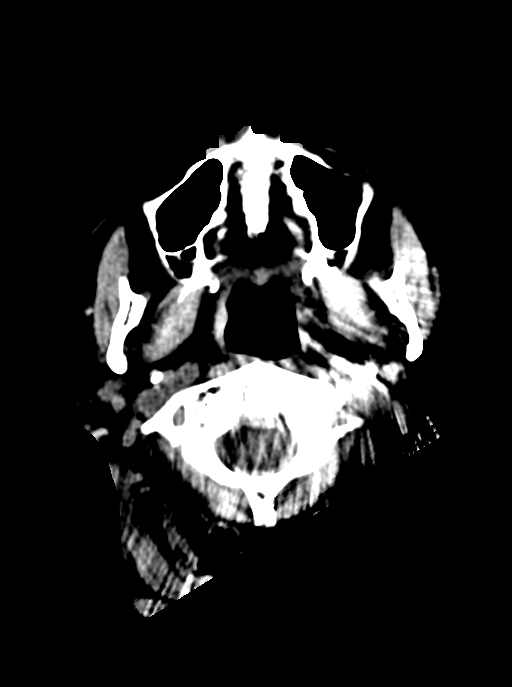
[im 7/38  bone]
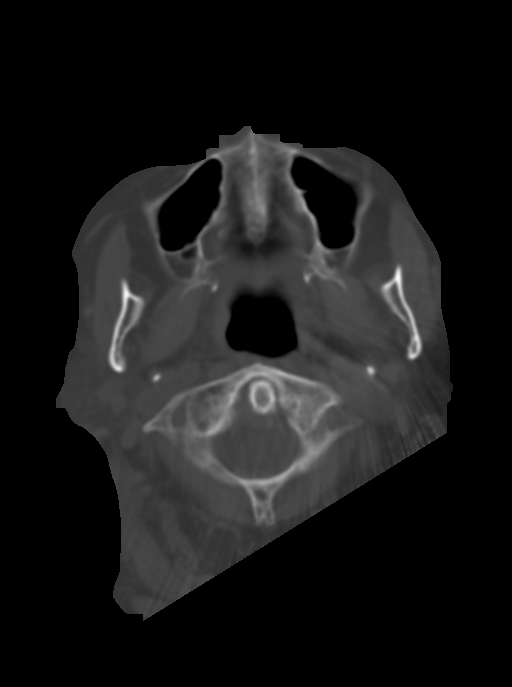
[im 13/38  brain]
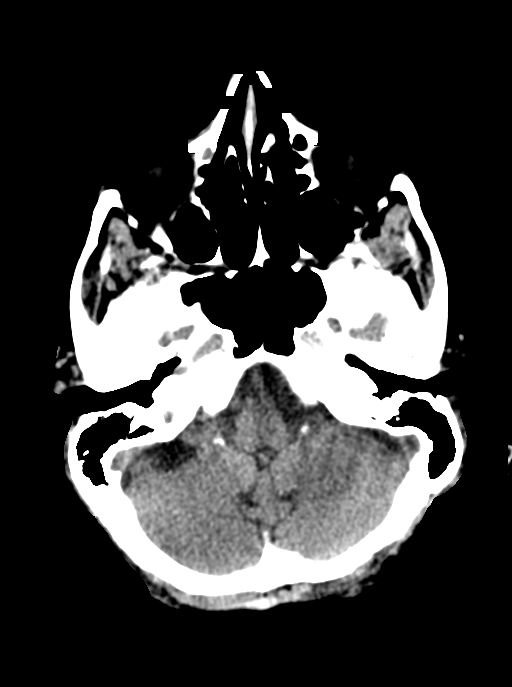
[im 19/38  brain]
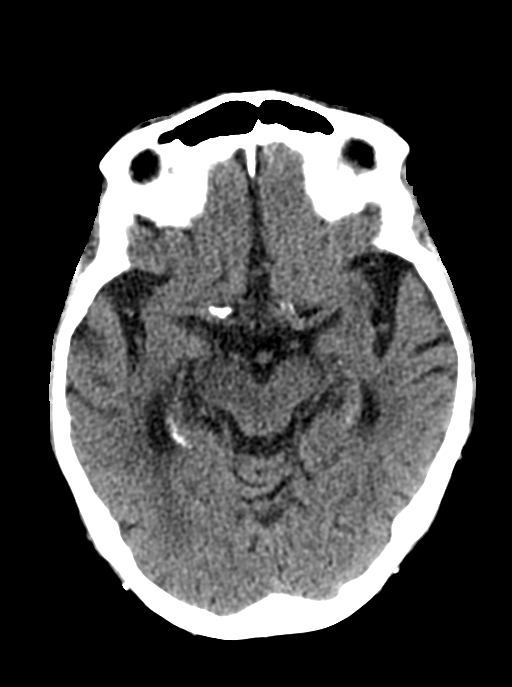
[im 25/38  brain]
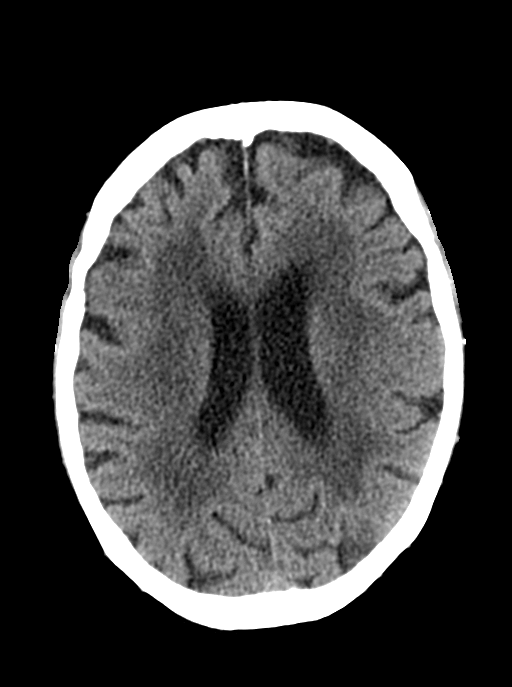
[im 31/38  brain]
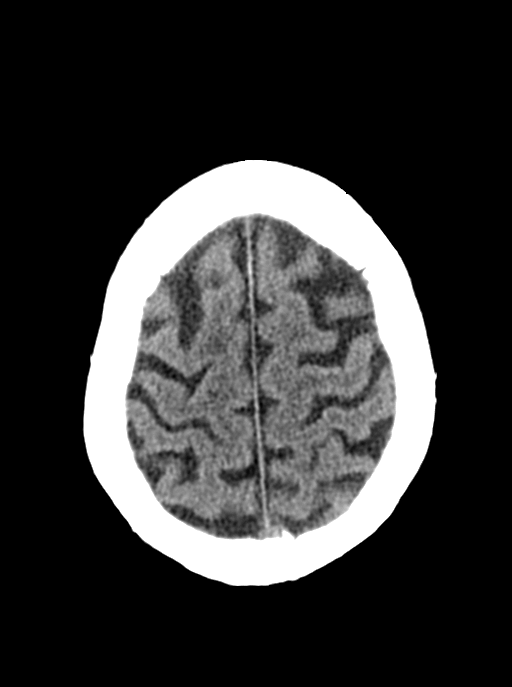
[im 31/38  bone]
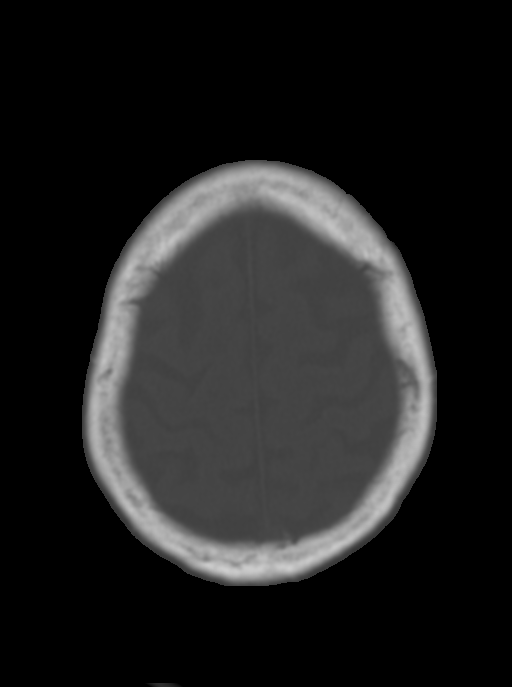

[14 of 47 positions shown; findings below may reference images not displayed]

FINDINGS: CT HEAD FINDINGS

Brain: Age related atrophy. Extensive chronic small-vessel ischemic
changes of the white matter. No sign acute infarction, mass lesion,
hemorrhage, hydrocephalus or extra-axial collection.

Vascular: There is atherosclerotic calcification of the major
vessels at the base of the brain.

Skull: No skull fracture.

Sinuses/Orbits: No fluid in the sinuses. No evidence of orbital
injury.

Other: None

CT CERVICAL SPINE FINDINGS

Alignment: Exaggerated cervical lordosis. 2-3 mm degenerative
anterolisthesis at C6-7, unchanged from prior.

Skull base and vertebrae: No regional fracture.

Soft tissues and spinal canal: No evidence of traumatic soft tissue
finding.

Disc levels: Degenerative disc disease and degenerative facet
disease throughout the region. Moderate bony foraminal stenosis,
unchanged from previous studies.

Upper chest: Negative

Other: None
IMPRESSION: Head CT: Atrophy and chronic small-vessel ischemic changes. No acute
or traumatic finding

Cervical spine CT: Chronic degenerative changes, stable over prior
is scratch that is stable since prior exams. No acute or traumatic
finding.

## 2021-05-15 IMAGING — CR DG HIP (WITH OR WITHOUT PELVIS) 2-3V*R*
1 series · 3 of 3 positions shown · non-contrast
Comparison: [DATE]

CLINICAL DATA: Status post fall.

EXAM:
DG HIP (WITH OR WITHOUT PELVIS) 2-3V RIGHT

[Series 1: dg hip unilat w or w/o pelvis 2-3 views  · non-contrast · 0.14mm/px · 3 of 3 slices shown]
[im 1/3]
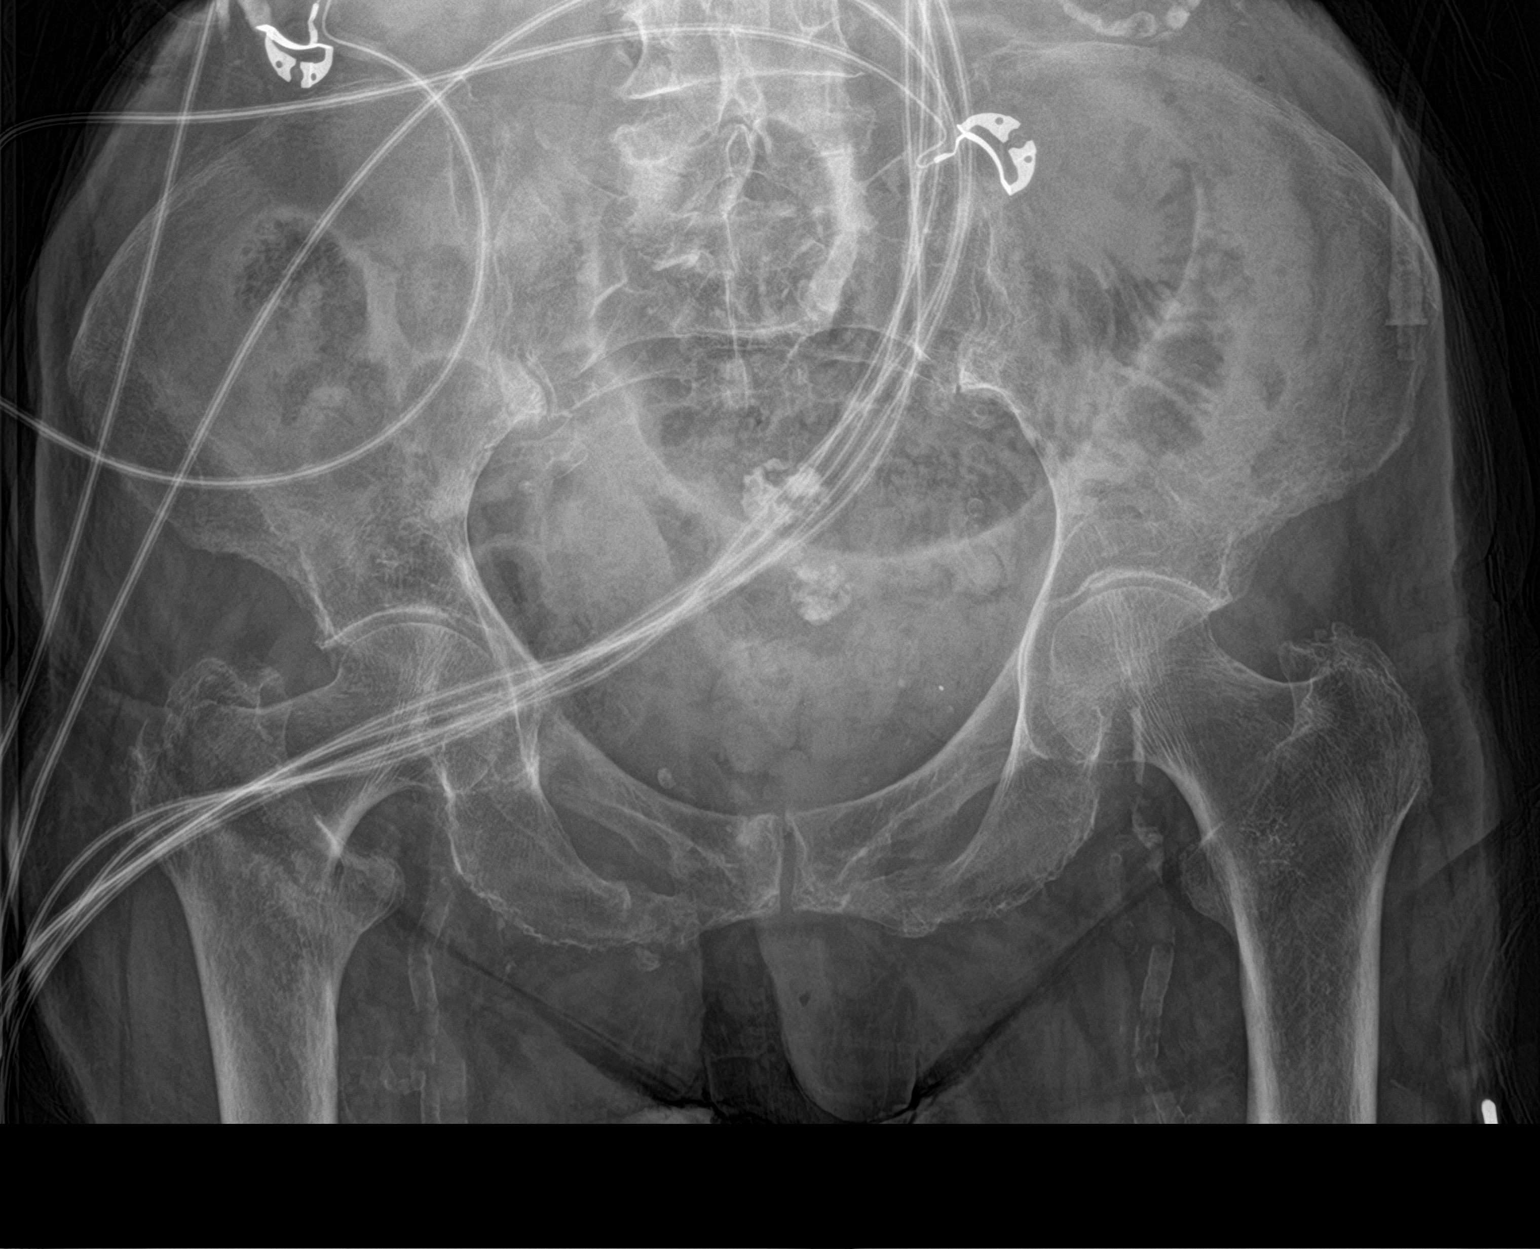
[im 2/3]
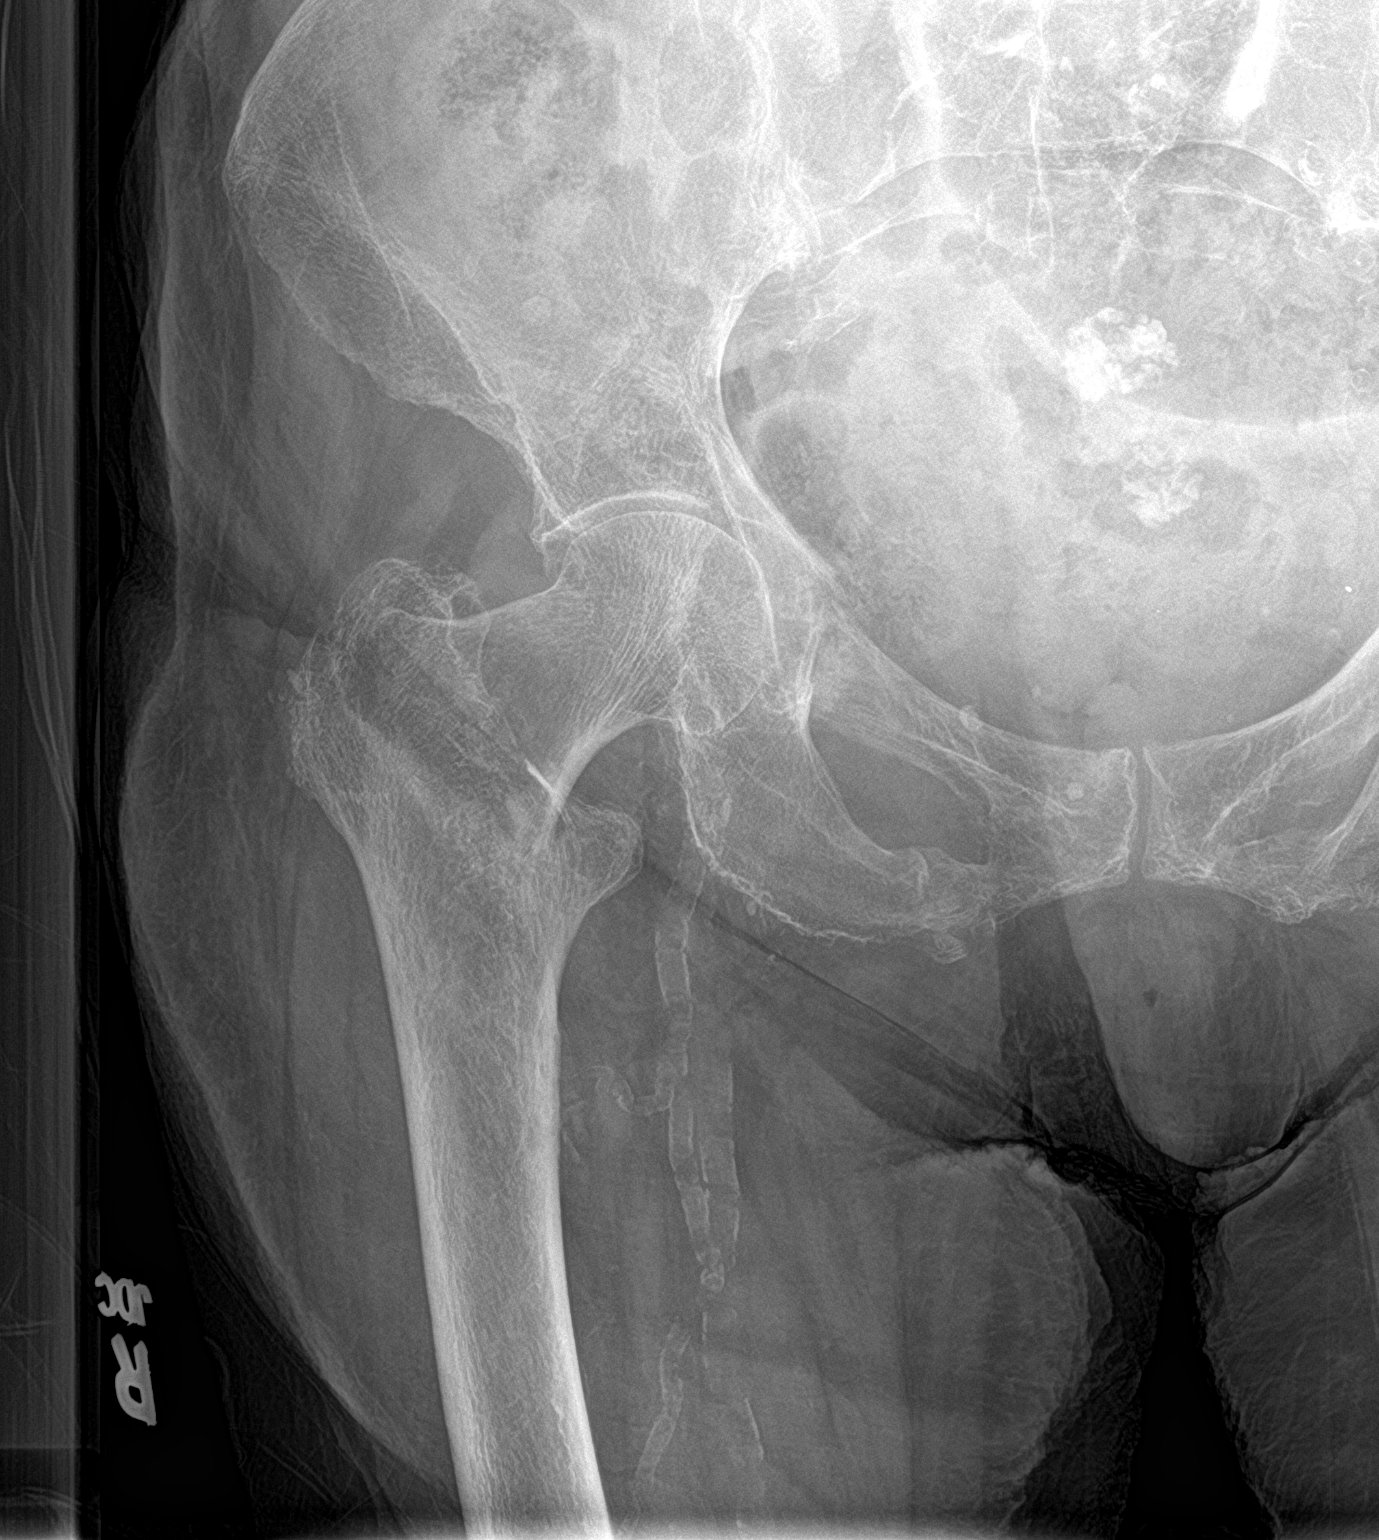
[im 3/3]
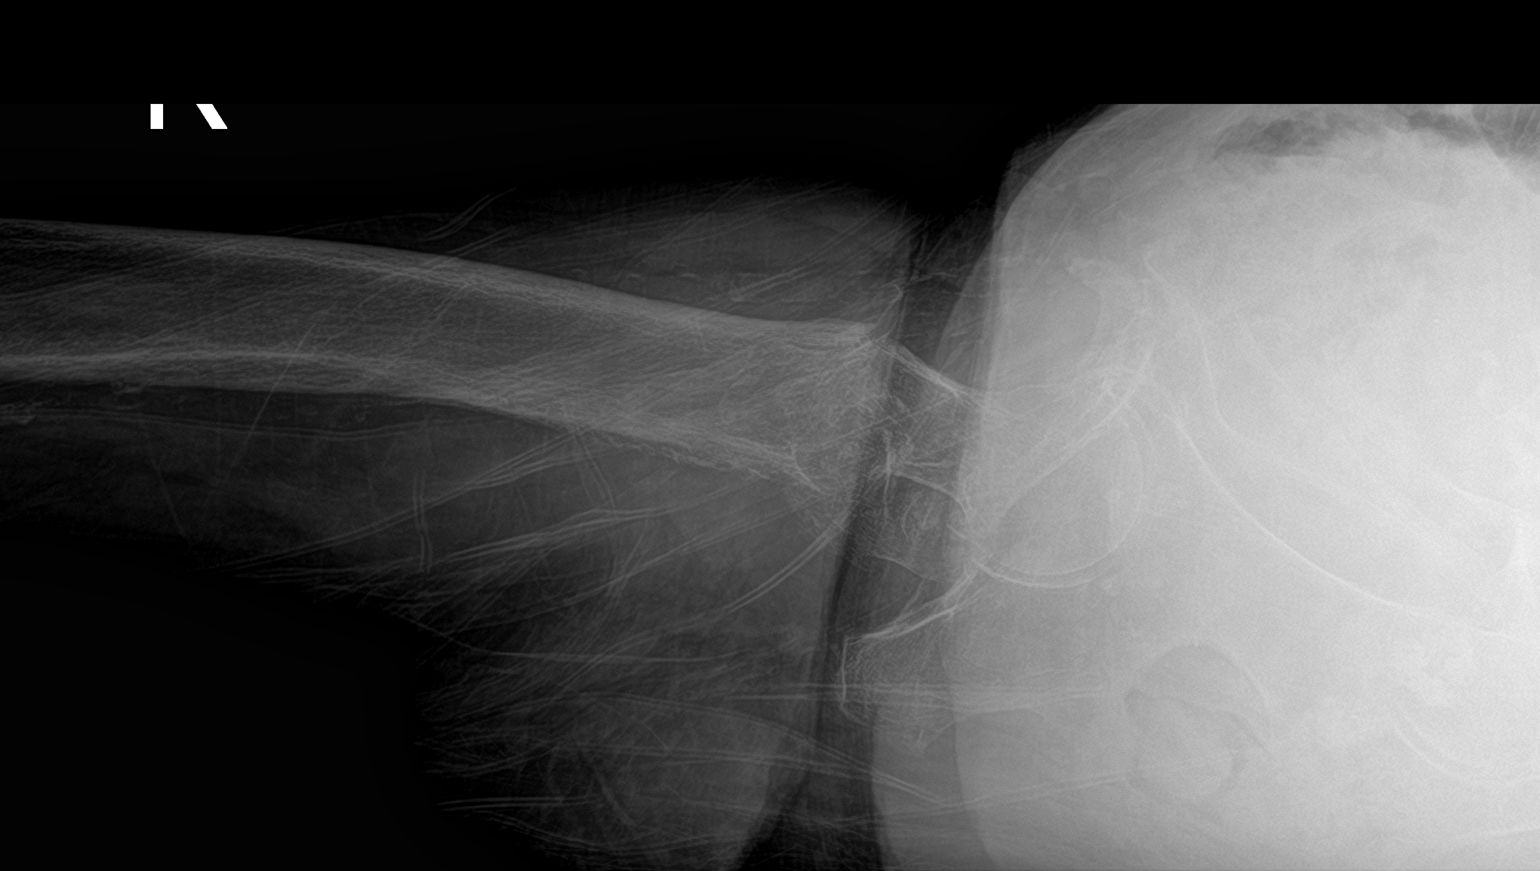

[3 of 3 positions shown; findings below may reference images not displayed]

FINDINGS: There is an acute, impacted intertrochanteric fracture involving the
proximal right femur. No dislocation identified. Healing fractures
involving the right superior and inferior pubic rami are again
identified.
IMPRESSION: Acute, impacted intertrochanteric fracture of the proximal right
femur.

## 2021-05-15 IMAGING — DX DG CHEST 1V
1 series · 1 of 1 positions shown · non-contrast
Comparison: [DATE].

CLINICAL DATA: Right hip fracture.

EXAM:
CHEST  1 VIEW

[chest ap]
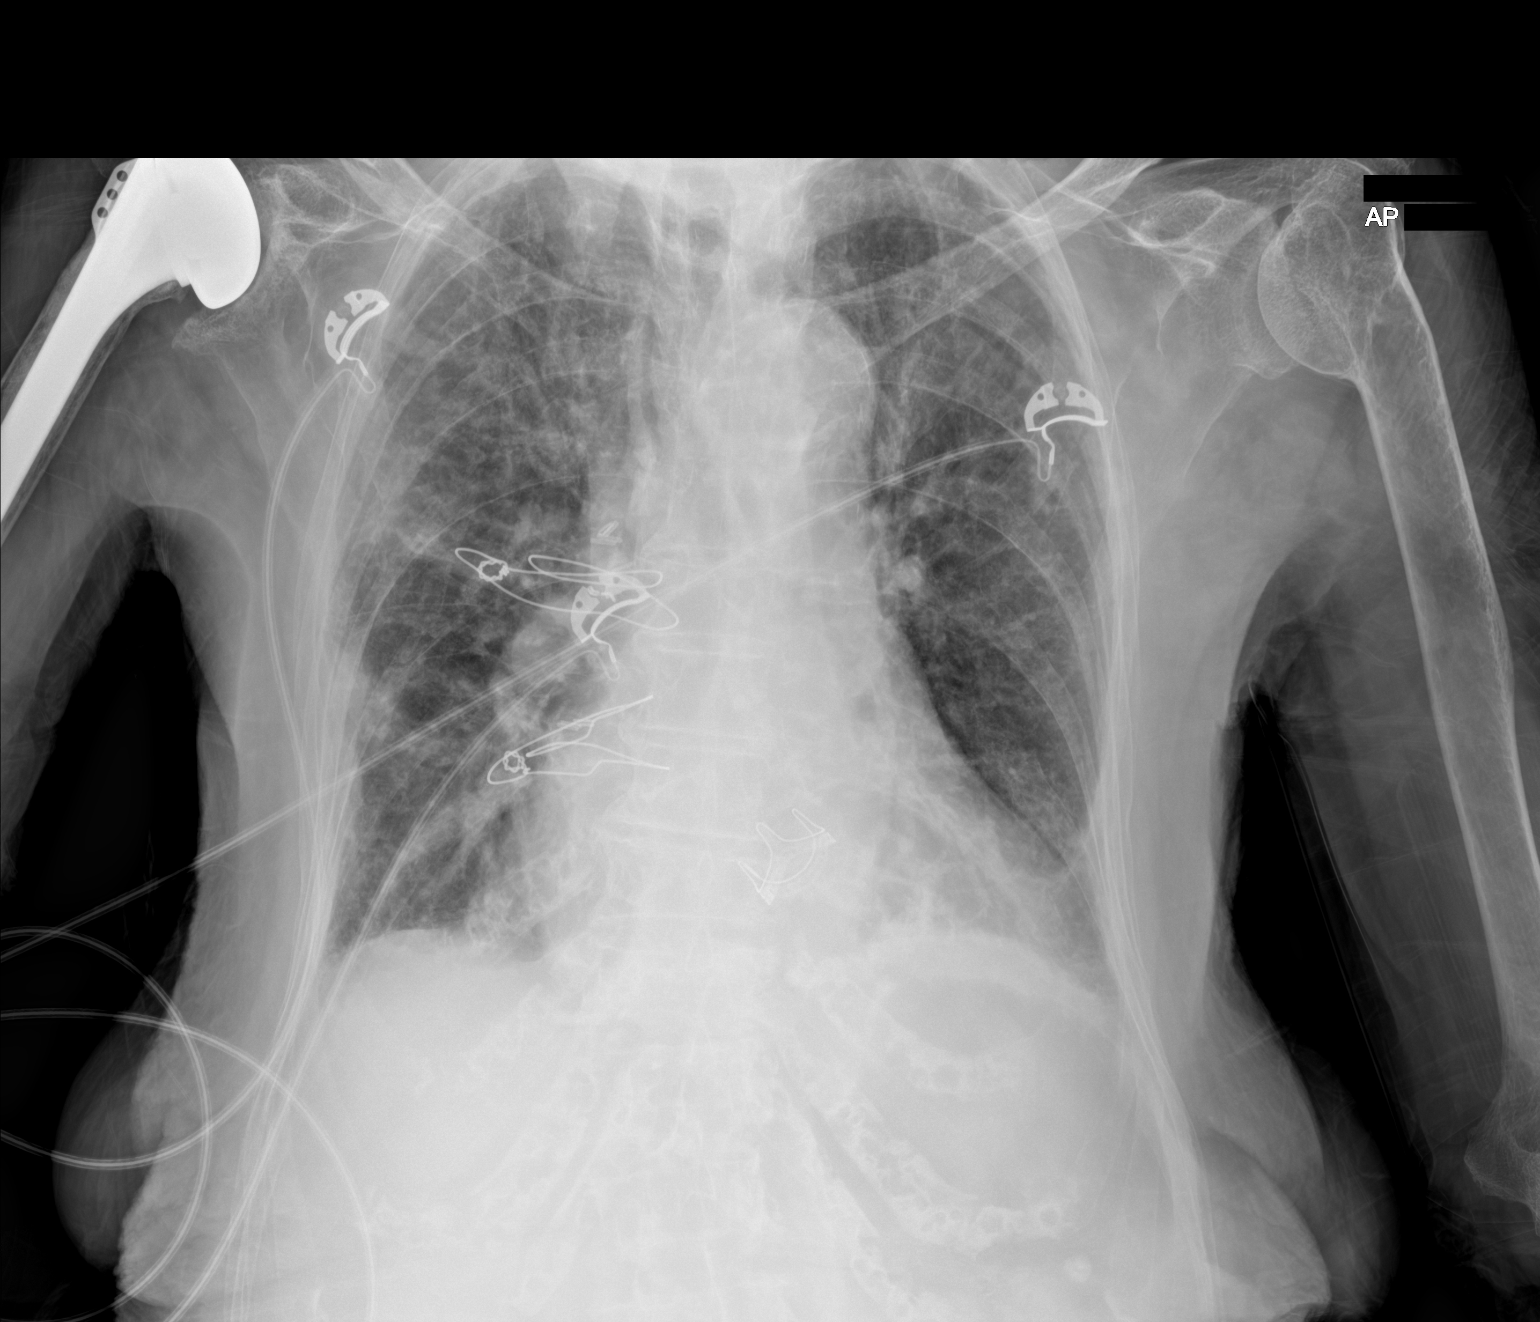

[1 of 1 positions shown; findings below may reference images not displayed]

FINDINGS: Stable cardiomegaly. No pneumothorax is noted. Status post aortic
valve repair. Mild central pulmonary vascular congestion is noted.
Minimal bibasilar subsegmental atelectasis is noted. Status post
right shoulder arthroplasty.
IMPRESSION: Stable cardiomegaly with mild central pulmonary vascular congestion
is noted. Minimal bibasilar subsegmental atelectasis.

## 2021-05-15 MED ORDER — FENTANYL CITRATE (PF) 100 MCG/2ML IJ SOLN
50.0000 ug | Freq: Once | INTRAMUSCULAR | Status: AC
Start: 2021-05-15 — End: 2021-05-15
  Administered 2021-05-15: 50 ug via INTRAMUSCULAR
  Filled 2021-05-15: qty 2

## 2021-05-15 MED ORDER — ONDANSETRON HCL 4 MG PO TABS: 4.0000 mg | ORAL_TABLET | Freq: Four times a day (QID) | ORAL | Status: DC | PRN

## 2021-05-15 MED ORDER — DICLOFENAC SODIUM 1 % EX GEL
4.0000 g | Freq: Four times a day (QID) | CUTANEOUS | Status: DC | PRN
  Filled 2021-05-15: qty 100

## 2021-05-15 MED ORDER — SODIUM CHLORIDE 0.9 % IV BOLUS
500.0000 mL | Freq: Once | INTRAVENOUS | Status: AC
  Administered 2021-05-15: 500 mL via INTRAVENOUS

## 2021-05-15 MED ORDER — LORATADINE 10 MG PO TABS
10.0000 mg | ORAL_TABLET | Freq: Every day | ORAL | Status: DC
  Administered 2021-05-16 – 2021-05-21 (×6): 10 mg via ORAL
  Filled 2021-05-15 (×7): qty 1

## 2021-05-15 MED ORDER — HYDRALAZINE HCL 25 MG PO TABS
25.0000 mg | ORAL_TABLET | Freq: Once | ORAL | Status: AC
  Administered 2021-05-15: 25 mg via ORAL
  Filled 2021-05-15: qty 1

## 2021-05-15 MED ORDER — OXYCODONE HCL 5 MG PO TABS
5.0000 mg | ORAL_TABLET | Freq: Four times a day (QID) | ORAL | Status: DC | PRN
  Administered 2021-05-15 – 2021-05-16 (×2): 5 mg via ORAL
  Filled 2021-05-15 (×2): qty 1

## 2021-05-15 MED ORDER — ACETAMINOPHEN 500 MG PO TABS: 500.0000 mg | ORAL_TABLET | Freq: Two times a day (BID) | ORAL | Status: DC | PRN

## 2021-05-15 MED ORDER — METOPROLOL SUCCINATE ER 25 MG PO TB24
25.0000 mg | ORAL_TABLET | Freq: Once | ORAL | Status: AC
  Administered 2021-05-15: 25 mg via ORAL
  Filled 2021-05-15: qty 1

## 2021-05-15 MED ORDER — HYDRALAZINE HCL 10 MG PO TABS
10.0000 mg | ORAL_TABLET | Freq: Two times a day (BID) | ORAL | Status: DC
  Administered 2021-05-16 – 2021-05-17 (×4): 10 mg via ORAL
  Filled 2021-05-15 (×6): qty 1

## 2021-05-15 MED ORDER — LOSARTAN POTASSIUM 25 MG PO TABS: 12.5000 mg | ORAL_TABLET | Freq: Every day | ORAL | Status: DC

## 2021-05-15 MED ORDER — ESCITALOPRAM OXALATE 20 MG PO TABS
20.0000 mg | ORAL_TABLET | Freq: Every evening | ORAL | Status: DC
  Administered 2021-05-17 – 2021-05-21 (×4): 20 mg via ORAL
  Filled 2021-05-15 (×6): qty 1

## 2021-05-15 MED ORDER — METOPROLOL SUCCINATE ER 25 MG PO TB24
25.0000 mg | ORAL_TABLET | Freq: Every day | ORAL | Status: DC
  Administered 2021-05-16 – 2021-05-21 (×6): 25 mg via ORAL
  Filled 2021-05-15 (×7): qty 1

## 2021-05-15 MED ORDER — FUROSEMIDE 20 MG PO TABS
20.0000 mg | ORAL_TABLET | Freq: Every day | ORAL | Status: DC
  Administered 2021-05-16: 20 mg via ORAL
  Filled 2021-05-15: qty 1

## 2021-05-15 MED ORDER — MORPHINE SULFATE (PF) 2 MG/ML IV SOLN
1.0000 mg | INTRAVENOUS | Status: DC | PRN
  Administered 2021-05-15: 1 mg via INTRAVENOUS
  Filled 2021-05-15: qty 1

## 2021-05-15 MED ORDER — CEFAZOLIN SODIUM-DEXTROSE 1-4 GM/50ML-% IV SOLN
1.0000 g | Freq: Once | INTRAVENOUS | Status: AC
  Administered 2021-05-16: 1 g via INTRAVENOUS
  Filled 2021-05-15 (×2): qty 50

## 2021-05-15 MED ORDER — ONDANSETRON HCL 4 MG/2ML IJ SOLN: 4.0000 mg | Freq: Four times a day (QID) | INTRAMUSCULAR | Status: DC | PRN

## 2021-05-15 MED ORDER — MUPIROCIN 2 % EX OINT
1.0000 | TOPICAL_OINTMENT | Freq: Two times a day (BID) | CUTANEOUS | Status: AC
Start: 2021-05-15 — End: 2021-05-20
  Administered 2021-05-15 – 2021-05-20 (×10): 1 via NASAL
  Filled 2021-05-15 (×2): qty 22

## 2021-05-15 MED ORDER — MOMETASONE FURO-FORMOTEROL FUM 200-5 MCG/ACT IN AERO
2.0000 | INHALATION_SPRAY | Freq: Two times a day (BID) | RESPIRATORY_TRACT | Status: DC
Start: 2021-05-15 — End: 2021-05-21
  Administered 2021-05-15 – 2021-05-21 (×11): 2 via RESPIRATORY_TRACT
  Filled 2021-05-15 (×2): qty 8.8

## 2021-05-15 MED ORDER — BUDESONIDE 0.5 MG/2ML IN SUSP
0.5000 mg | Freq: Every day | RESPIRATORY_TRACT | Status: DC
  Administered 2021-05-15 – 2021-05-20 (×5): 0.5 mg via RESPIRATORY_TRACT
  Filled 2021-05-15 (×7): qty 2

## 2021-05-15 MED ORDER — IPRATROPIUM-ALBUTEROL 0.5-2.5 (3) MG/3ML IN SOLN
3.0000 mL | Freq: Three times a day (TID) | RESPIRATORY_TRACT | Status: DC
  Administered 2021-05-15 – 2021-05-16 (×2): 3 mL via RESPIRATORY_TRACT
  Filled 2021-05-15 (×2): qty 3

## 2021-05-15 NOTE — Consult Note (Signed)
Reason for Consult: Right hip fracture Referring Physician: Dr. Clotilde Dieter is an 87 y.o. female.  HPI: Patient is a 87 year old female who is minimal ambulator with a cane or walker who has multiple medical problems as repeated dizzy spells and falls secondary to severe aortic stenosis.  She suffered a pelvic fracture in the not-too-distant past which is healed on the same side.  She was found on the floor today with inability to bear weight and came to the emergency room where she was found to have a right intratrochanteric hip fracture.  She is in severe pain and we discussed that she normally does not is not in much pain despite her multiple medical problems.  Past Medical History:  Diagnosis Date   Arthritis    CHF (congestive heart failure) (HCC)    COPD (chronic obstructive pulmonary disease) (HCC)    Coronary artery disease    Hypercholesteremia    Hypertension     Past Surgical History:  Procedure Laterality Date   AORTIC VALVE REPLACEMENT  2006   DILATION AND CURETTAGE OF UTERUS  1988   SHOULDER SURGERY  09/1999   SIGMOIDOSCOPY  1992    Family History  Problem Relation Age of Onset   AAA (abdominal aortic aneurysm) Father    Lung cancer Brother    Prostate cancer Brother    Heart failure Brother     Social History:  reports that she has never smoked. She has never used smokeless tobacco. She reports that she does not drink alcohol and does not use drugs.  Allergies:  Allergies  Allergen Reactions   Iodine Anaphylaxis   Shellfish Allergy Anaphylaxis   Azithromycin Hives   Sulfa Antibiotics Hives    Medications: I have reviewed the patient's current medications.  Results for orders placed or performed during the hospital encounter of 05/15/21 (from the past 48 hour(s))  Resp Panel by RT-PCR (Flu A&B, Covid) Nasopharyngeal Swab     Status: None   Collection Time: 05/15/21  2:53 PM   Specimen: Nasopharyngeal Swab; Nasopharyngeal(NP) swabs in vial  transport medium  Result Value Ref Range   SARS Coronavirus 2 by RT PCR NEGATIVE NEGATIVE    Comment: (NOTE) SARS-CoV-2 target nucleic acids are NOT DETECTED.  The SARS-CoV-2 RNA is generally detectable in upper respiratory specimens during the acute phase of infection. The lowest concentration of SARS-CoV-2 viral copies this assay can detect is 138 copies/mL. A negative result does not preclude SARS-Cov-2 infection and should not be used as the sole basis for treatment or other patient management decisions. A negative result may occur with  improper specimen collection/handling, submission of specimen other than nasopharyngeal swab, presence of viral mutation(s) within the areas targeted by this assay, and inadequate number of viral copies(<138 copies/mL). A negative result must be combined with clinical observations, patient history, and epidemiological information. The expected result is Negative.  Fact Sheet for Patients:  EntrepreneurPulse.com.au  Fact Sheet for Healthcare Providers:  IncredibleEmployment.be  This test is no t yet approved or cleared by the [place] FDA and  has been authorized for detection and/or diagnosis of SARS-CoV-2 by FDA under an Emergency Use Authorization (EUA). This EUA will remain  in effect (meaning this test can be used) for the duration of the COVID-19 declaration under Section 564(b)(1) of the Act, 21 U.S.C.section 360bbb-3(b)(1), unless the authorization is terminated  or revoked sooner.       Influenza A by PCR NEGATIVE NEGATIVE   Influenza B by PCR  NEGATIVE NEGATIVE    Comment: (NOTE) The Xpert Xpress SARS-CoV-2/FLU/RSV plus assay is intended as an aid in the diagnosis of influenza from Nasopharyngeal swab specimens and should not be used as a sole basis for treatment. Nasal washings and aspirates are unacceptable for Xpert Xpress SARS-CoV-2/FLU/RSV testing.  Fact Sheet for  Patients: EntrepreneurPulse.com.au  Fact Sheet for Healthcare Providers: IncredibleEmployment.be  This test is not yet approved or cleared by the [place] FDA and has been authorized for detection and/or diagnosis of SARS-CoV-2 by FDA under an Emergency Use Authorization (EUA). This EUA will remain in effect (meaning this test can be used) for the duration of the COVID-19 declaration under Section 564(b)(1) of the Act, 21 U.S.C. section 360bbb-3(b)(1), unless the authorization is terminated or revoked.  Performed at [facility], [address] [place], [place] [address]   CBC with Differential     Status: Abnormal   Collection Time: 05/15/21  3:01 PM  Result Value Ref Range   WBC 12.3 (H) 4.0 - 10.5 K/uL   RBC 3.35 (L) 3.87 - 5.11 MIL/uL   Hemoglobin 10.7 (L) 12.0 - 15.0 g/dL   HCT 30.8 (L) 36.0 - 46.0 %   MCV 91.9 80.0 - 100.0 fL   MCH 31.9 26.0 - 34.0 pg   MCHC 34.7 30.0 - 36.0 g/dL   RDW 13.2 11.5 - 15.5 %   Platelets 205 150 - 400 K/uL   nRBC 0.0 0.0 - 0.2 %   Neutrophils Relative % 92 %   Neutro Abs 11.3 (H) 1.7 - 7.7 K/uL   Lymphocytes Relative 4 %   Lymphs Abs 0.5 (L) 0.7 - 4.0 K/uL   Monocytes Relative 4 %   Monocytes Absolute 0.4 0.1 - 1.0 K/uL   Eosinophils Relative 0 %   Eosinophils Absolute 0.0 0.0 - 0.5 K/uL   Basophils Relative 0 %   Basophils Absolute 0.1 0.0 - 0.1 K/uL   Immature Granulocytes 0 %   Abs Immature Granulocytes 0.04 0.00 - 0.07 K/uL    Comment: Performed at [facility], [address], [place], [place] [address]  Basic metabolic panel     Status: Abnormal   Collection Time: 05/15/21  3:01 PM  Result Value Ref Range   Sodium 130 (L) 135 - 145 mmol/L   Potassium 4.3 3.5 - 5.1 mmol/L   Chloride 95 (L) 98 - 111 mmol/L   CO2 25 22 - 32 mmol/L   Glucose, Bld 155 (H) 70 - 99 mg/dL    Comment: Glucose reference range applies only to samples taken after fasting for at least 8 hours.    BUN 43 (H) 8 - 23 mg/dL   Creatinine, Ser 2.01 (H) 0.44 - 1.00 mg/dL   Calcium 9.2 8.9 - 10.3 mg/dL   GFR, Estimated 22 (L) >60 mL/min    Comment: (NOTE) Calculated using the CKD-EPI Creatinine Equation (2021)    Anion gap 10 5 - 15    Comment: Performed at [facility], [address] [place], [place] [address]  CK     Status: None   Collection Time: 05/15/21  3:01 PM  Result Value Ref Range   Total CK 73 38 - 234 U/L    Comment: Performed at [facility], [address] [place], [place] [address]  Urinalysis, Complete w Microscopic Urine, Unspecified Source     Status: Abnormal   Collection Time: 05/15/21  3:24 PM  Result Value Ref Range   Color, Urine STRAW (A)  YELLOW   APPearance CLEAR (A) CLEAR   Specific Gravity, Urine 1.006 1.005 - 1.030   pH 5.0 5.0 - 8.0   Glucose, UA NEGATIVE NEGATIVE mg/dL   Hgb urine dipstick SMALL (A) NEGATIVE   Bilirubin Urine NEGATIVE NEGATIVE   Ketones, ur NEGATIVE NEGATIVE mg/dL   Protein, ur NEGATIVE NEGATIVE mg/dL   Nitrite NEGATIVE NEGATIVE   Leukocytes,Ua NEGATIVE NEGATIVE   WBC, UA 0-5 0 - 5 WBC/hpf   Bacteria, UA RARE (A) NONE SEEN   Squamous Epithelial / LPF 0-5 0 - 5    Comment: Performed at [facility], [address] [place], [place] [address]    DG Chest 1 View  Result Date: 05/15/2021 CLINICAL DATA:  Right hip fracture. EXAM: CHEST  1 VIEW COMPARISON:  March 22, 2020. FINDINGS: Stable cardiomegaly. No pneumothorax is noted. Status post aortic valve repair. Mild central pulmonary vascular congestion is noted. Minimal bibasilar subsegmental atelectasis is noted. Status post right shoulder arthroplasty. IMPRESSION: Stable cardiomegaly with mild central pulmonary vascular congestion is noted. Minimal bibasilar subsegmental atelectasis. Electronically Signed   By: Marijo Conception M.D.   On: 05/15/2021 13:28   CT HEAD WO CONTRAST (5MM)  Result Date: 05/15/2021 CLINICAL DATA:  Polytrauma. Critical. Head  and cervical spine injury. Fell at facility. EXAM: CT HEAD WITHOUT CONTRAST CT CERVICAL SPINE WITHOUT CONTRAST TECHNIQUE: Multidetector CT imaging of the head and cervical spine was performed following the standard protocol without intravenous contrast. Multiplanar CT image reconstructions of the cervical spine were also generated. COMPARISON:  04/18/2021 FINDINGS: CT HEAD FINDINGS Brain: Age related atrophy. Extensive chronic small-vessel ischemic changes of the white matter. No sign acute infarction, mass lesion, hemorrhage, hydrocephalus or extra-axial collection. Vascular: There is atherosclerotic calcification of the major vessels at the base of the brain. Skull: No skull fracture. Sinuses/Orbits: No fluid in the sinuses. No evidence of orbital injury. Other: None CT CERVICAL SPINE FINDINGS Alignment: Exaggerated cervical lordosis. 2-3 mm degenerative anterolisthesis at C6-7, unchanged from prior. Skull base and vertebrae: No regional fracture. Soft tissues and spinal canal: No evidence of traumatic soft tissue finding. Disc levels: Degenerative disc disease and degenerative facet disease throughout the region. Moderate bony foraminal stenosis, unchanged from previous studies. Upper chest: Negative Other: None IMPRESSION: Head CT: Atrophy and chronic small-vessel ischemic changes. No acute or traumatic finding Cervical spine CT: Chronic degenerative changes, stable over prior is scratch that is stable since prior exams. No acute or traumatic finding. Electronically Signed   By: Nelson Chimes M.D.   On: 05/15/2021 14:07   CT Cervical Spine Wo Contrast  Result Date: 05/15/2021 CLINICAL DATA:  Polytrauma. Critical. Head and cervical spine injury. Fell at facility. EXAM: CT HEAD WITHOUT CONTRAST CT CERVICAL SPINE WITHOUT CONTRAST TECHNIQUE: Multidetector CT imaging of the head and cervical spine was performed following the standard protocol without intravenous contrast. Multiplanar CT image reconstructions of  the cervical spine were also generated. COMPARISON:  04/18/2021 FINDINGS: CT HEAD FINDINGS Brain: Age related atrophy. Extensive chronic small-vessel ischemic changes of the white matter. No sign acute infarction, mass lesion, hemorrhage, hydrocephalus or extra-axial collection. Vascular: There is atherosclerotic calcification of the major vessels at the base of the brain. Skull: No skull fracture. Sinuses/Orbits: No fluid in the sinuses. No evidence of orbital injury. Other: None CT CERVICAL SPINE FINDINGS Alignment: Exaggerated cervical lordosis. 2-3 mm degenerative anterolisthesis at C6-7, unchanged from prior. Skull base and vertebrae: No regional fracture. Soft tissues and spinal canal: No evidence of traumatic soft tissue  finding. Disc levels: Degenerative disc disease and degenerative facet disease throughout the region. Moderate bony foraminal stenosis, unchanged from previous studies. Upper chest: Negative Other: None IMPRESSION: Head CT: Atrophy and chronic small-vessel ischemic changes. No acute or traumatic finding Cervical spine CT: Chronic degenerative changes, stable over prior is scratch that is stable since prior exams. No acute or traumatic finding. Electronically Signed   By: Nelson Chimes M.D.   On: 05/15/2021 14:07   DG Hip Unilat W or Wo Pelvis 2-3 Views Right  Result Date: 05/15/2021 CLINICAL DATA:  Status post fall. EXAM: DG HIP (WITH OR WITHOUT PELVIS) 2-3V RIGHT COMPARISON:  04/18/2021 FINDINGS: There is an acute, impacted intertrochanteric fracture involving the proximal right femur. No dislocation identified. Healing fractures involving the right superior and inferior pubic rami are again identified. IMPRESSION: Acute, impacted intertrochanteric fracture of the proximal right femur. Electronically Signed   By: Kerby Moors M.D.   On: 05/15/2021 12:32    Review of Systems Blood pressure (!) 198/72, pulse 65, temperature 98.2 F (36.8 C), temperature source Oral, resp. rate 15,  height 5\' 2"  (1.575 m), weight 51.2 kg, SpO2 98 %. Physical Exam Right leg is shortened and externally rotated with skin intact around the hip Assessment/Plan: Right intertrochanteric hip fracture with slight displacement Recommendation is for ORIF for pain relief with the understanding that she has severe medical conditions that make her very high risk for surgery.  Surgery is to alleviate pain and try to allow for some mobility.  Family understands that she is at high risk for death during the procedure secondary to her aortic stenosis.  Hessie Knows 05/15/2021, 5:24 PM

## 2021-05-15 NOTE — H&P (Signed)
[facility] [place] NAME: Marilyn Oconnell    MR#:  [id number]  DATE OF BIRTH:  02/21/1924  DATE OF ADMISSION:  05/15/2021  PRIMARY CARE PHYSICIAN: Chrismon, Vickki Muff, PA-C   REQUESTING/REFERRING PHYSICIAN: Dr. Conni Slipper  CHIEF COMPLAINT:   Chief Complaint  Patient presents with  . Fall    HISTORY OF PRESENT ILLNESS:  Marilyn Oconnell  is a 87 y.o. female with a known history of severe aortic stenosis, chronic diastolic congestive heart failure, COPD, dementia, legally blind, abdominal mass, atrial fibrillation.  She presents to the hospital after feeling dizzy and having a fall.  She had some nausea vomiting.  She sometimes tries to do too much on her own.  Normally walks with a walker.  She is currently at assisted living but they are looking into long-term care at [facility].  No complaints of chest pain.  Always has some shortness of breath.  In the ER she was found to have an intertrochanteric right hip fracture.  Family interested in procedure if will help out with the severe pain that she is having.  She is followed by palliative care and is a DO NOT RESUSCITATE.  PAST MEDICAL HISTORY:   Past Medical History:  Diagnosis Date  . Arthritis   . CHF (congestive heart failure) ([place])   . COPD (chronic obstructive pulmonary disease) ([place])   . Coronary artery disease   . Hypercholesteremia   . Hypertension     PAST SURGICAL HISTORY:   Past Surgical History:  Procedure Laterality Date  . AORTIC VALVE REPLACEMENT  2006  . DILATION AND CURETTAGE OF UTERUS  1988  . SHOULDER SURGERY  09/1999  . SIGMOIDOSCOPY  1992    SOCIAL HISTORY:   Social History   Tobacco Use  . Smoking status: Never  . Smokeless tobacco: Never  Substance Use Topics  . Alcohol use: No    FAMILY HISTORY:   Family History  Problem Relation Age of Onset  . AAA (abdominal aortic aneurysm) Father   . Lung cancer Brother   . Prostate cancer Brother   . Heart  failure Brother     DRUG ALLERGIES:   Allergies  Allergen Reactions  . Iodine Anaphylaxis  . Shellfish Allergy Anaphylaxis  . Azithromycin Hives  . Sulfa Antibiotics Hives    REVIEW OF SYSTEMS:  CONSTITUTIONAL: Positive for dizziness.  EYES: Legally blind EARS, NOSE, AND THROAT: Positive for dizziness.  No sore throat RESPIRATORY: No cough, positive for shortness of breath. CARDIOVASCULAR: No chest pain.  GASTROINTESTINAL: Positive for nausea, vomiting, and occasional diarrhea. GENITOURINARY: No dysuria.  Frequent UTIs HEMATOLOGY: No anemia SKIN: No rash or lesion. MUSCULOSKELETAL: Right hip pain NEUROLOGIC: Positive for dizziness.  PSYCHIATRY: History of depression.   MEDICATIONS AT HOME:   Prior to Admission medications   Medication Sig Start Date End Date Taking? Authorizing Provider  acetaminophen (TYLENOL) 500 MG tablet Take 500-1,000 mg by mouth 2 (two) times daily as needed for mild pain, moderate pain, fever or headache.     [provider]  albuterol (PROVENTIL) (2.5 MG/3ML) 0.083% nebulizer solution Take 2.5 mg by nebulization every 8 (eight) hours as needed for wheezing or shortness of breath.     [provider]  budesonide (PULMICORT) 0.5 MG/2ML nebulizer solution Take 0.5 mg by nebulization at bedtime.    [provider]  diclofenac Sodium (VOLTAREN) 1 % GEL Apply topically as needed.  03/29/20   [provider]  escitalopram (LEXAPRO) 20  MG tablet Take 1 tablet (20 mg total) by mouth daily. 03/28/21   Birdie Sons, MD  fexofenadine (ALLEGRA) 180 MG tablet Take 180 mg by mouth daily.    [provider]  Fluticasone-Salmeterol (ADVAIR) 250-50 MCG/DOSE AEPB INHALE ONE PUFF INTO LUNGS EVERY 12 HOURS. RINSE MOUTH AFTER USE 12/20/20   Trinna Post, PA-C  furosemide (LASIX) 20 MG tablet Take 1 tablet (20 mg total) by mouth daily. 02/13/21   Chrismon, Simona Huh E, PA-C  hydrALAZINE (APRESOLINE) 10 MG tablet TAKE 1 TABLET BY  MOUTH 2 TIMES DAILY 04/12/21   Virginia Crews, MD  ipratropium-albuterol (DUONEB) 0.5-2.5 (3) MG/3ML SOLN Take 3 mLs by nebulization 3 (three) times daily. 03/27/20   Swayze, Ava, DO  losartan (COZAAR) 25 MG tablet Take 0.5 tablets (12.5 mg total) by mouth daily. 03/28/21   Birdie Sons, MD  meloxicam (MOBIC) 15 MG tablet Take 15 mg by mouth daily.    [provider]  meloxicam (MOBIC) 7.5 MG tablet Take 7.5 mg by mouth daily. 04/06/21   [provider]  metoprolol succinate (TOPROL-XL) 50 MG 24 hr tablet TAKE 1 TABLET BY MOUTH  DAILY WITH OR IMMEDIATELY  FOLLOWING A MEAL 07/05/20   Trinna Post, PA-C      VITAL SIGNS:  Blood pressure (!) 198/72, pulse 65, temperature 98.2 F (36.8 C), temperature source Oral, resp. rate 15, height 5\' 2"  (1.575 m), weight 51.2 kg, SpO2 98 %.  PHYSICAL EXAMINATION:  GENERAL:  87 y.o.-year-old patient lying in the bed with no acute distress.  EYES: Pupils equal, round, reactive to light and accommodation. No scleral icterus. Extraocular muscles intact.  HEENT: Head atraumatic, normocephalic. Oropharynx and nasopharynx clear.  NECK:  Supple, no bruits.  No thyroid enlargement.  LUNGS: Normal breath sounds bilaterally, no wheezing, rales,rhonchi or crepitation. No use of accessory muscles of respiration.  CARDIOVASCULAR: S1, S2 normal.  3/6 systolic murmurs.  ABDOMEN: Soft, nontender, non-distended.  EXTREMITIES: No pedal edema.  Right leg shortened and externally rotated slightly. NEUROLOGIC: Cranial nerves II through XII are intact.   PSYCHIATRIC: The patient is alert and answers a few questions SKIN: No rash, lesion, or ulcer.   LABORATORY PANEL:   CBC Recent Labs  Lab 05/15/21 1501  WBC 12.3*  HGB 10.7*  HCT 30.8*  PLT 205   ------------------------------------------------------------------------------------------------------------------  Chemistries  Recent Labs  Lab 05/15/21 1501  NA 130*  K 4.3  CL 95*  CO2  25  GLUCOSE 155*  BUN 43*  CREATININE 2.01*  CALCIUM 9.2   ------------------------------------------------------------------------------------------------------------------    RADIOLOGY:  DG Chest 1 View  Result Date: 05/15/2021 CLINICAL DATA:  Right hip fracture. EXAM: CHEST  1 VIEW COMPARISON:  March 22, 2020. FINDINGS: Stable cardiomegaly. No pneumothorax is noted. Status post aortic valve repair. Mild central pulmonary vascular congestion is noted. Minimal bibasilar subsegmental atelectasis is noted. Status post right shoulder arthroplasty. IMPRESSION: Stable cardiomegaly with mild central pulmonary vascular congestion is noted. Minimal bibasilar subsegmental atelectasis. Electronically Signed   By: Marijo Conception M.D.   On: 05/15/2021 13:28   CT HEAD WO CONTRAST (5MM)  Result Date: 05/15/2021 CLINICAL DATA:  Polytrauma. Critical. Head and cervical spine injury. Fell at facility. EXAM: CT HEAD WITHOUT CONTRAST CT CERVICAL SPINE WITHOUT CONTRAST TECHNIQUE: Multidetector CT imaging of the head and cervical spine was performed following the standard protocol without intravenous contrast. Multiplanar CT image reconstructions of the cervical spine were also generated. COMPARISON:  04/18/2021 FINDINGS: CT HEAD FINDINGS Brain:  Age related atrophy. Extensive chronic small-vessel ischemic changes of the white matter. No sign acute infarction, mass lesion, hemorrhage, hydrocephalus or extra-axial collection. Vascular: There is atherosclerotic calcification of the major vessels at the base of the brain. Skull: No skull fracture. Sinuses/Orbits: No fluid in the sinuses. No evidence of orbital injury. Other: None CT CERVICAL SPINE FINDINGS Alignment: Exaggerated cervical lordosis. 2-3 mm degenerative anterolisthesis at C6-7, unchanged from prior. Skull base and vertebrae: No regional fracture. Soft tissues and spinal canal: No evidence of traumatic soft tissue finding. Disc levels: Degenerative disc disease  and degenerative facet disease throughout the region. Moderate bony foraminal stenosis, unchanged from previous studies. Upper chest: Negative Other: None IMPRESSION: Head CT: Atrophy and chronic small-vessel ischemic changes. No acute or traumatic finding Cervical spine CT: Chronic degenerative changes, stable over prior is scratch that is stable since prior exams. No acute or traumatic finding. Electronically Signed   By: Nelson Chimes M.D.   On: 05/15/2021 14:07   CT Cervical Spine Wo Contrast  Result Date: 05/15/2021 CLINICAL DATA:  Polytrauma. Critical. Head and cervical spine injury. Fell at facility. EXAM: CT HEAD WITHOUT CONTRAST CT CERVICAL SPINE WITHOUT CONTRAST TECHNIQUE: Multidetector CT imaging of the head and cervical spine was performed following the standard protocol without intravenous contrast. Multiplanar CT image reconstructions of the cervical spine were also generated. COMPARISON:  04/18/2021 FINDINGS: CT HEAD FINDINGS Brain: Age related atrophy. Extensive chronic small-vessel ischemic changes of the white matter. No sign acute infarction, mass lesion, hemorrhage, hydrocephalus or extra-axial collection. Vascular: There is atherosclerotic calcification of the major vessels at the base of the brain. Skull: No skull fracture. Sinuses/Orbits: No fluid in the sinuses. No evidence of orbital injury. Other: None CT CERVICAL SPINE FINDINGS Alignment: Exaggerated cervical lordosis. 2-3 mm degenerative anterolisthesis at C6-7, unchanged from prior. Skull base and vertebrae: No regional fracture. Soft tissues and spinal canal: No evidence of traumatic soft tissue finding. Disc levels: Degenerative disc disease and degenerative facet disease throughout the region. Moderate bony foraminal stenosis, unchanged from previous studies. Upper chest: Negative Other: None IMPRESSION: Head CT: Atrophy and chronic small-vessel ischemic changes. No acute or traumatic finding Cervical spine CT: Chronic degenerative  changes, stable over prior is scratch that is stable since prior exams. No acute or traumatic finding. Electronically Signed   By: Nelson Chimes M.D.   On: 05/15/2021 14:07   DG Hip Unilat W or Wo Pelvis 2-3 Views Right  Result Date: 05/15/2021 CLINICAL DATA:  Status post fall. EXAM: DG HIP (WITH OR WITHOUT PELVIS) 2-3V RIGHT COMPARISON:  04/18/2021 FINDINGS: There is an acute, impacted intertrochanteric fracture involving the proximal right femur. No dislocation identified. Healing fractures involving the right superior and inferior pubic rami are again identified. IMPRESSION: Acute, impacted intertrochanteric fracture of the proximal right femur. Electronically Signed   By: Kerby Moors M.D.   On: 05/15/2021 12:32    EKG:   Normal sinus rhythm 60 bpm, lateral T wave changes  IMPRESSION AND PLAN:   1.  Preoperative evaluation for right hip fracture.  With the patient's severe aortic stenosis patient is high risk for cardiovascular complications.  POA interested in procedure to help out the patient's pain.  Mortality high within the first year of a hip fracture especially if the patient does not get up and walk again.  Patient is a DNR. 2.  Severe aortic stenosis seen on echocardiogram last year.  Chronic diastolic congestive heart failure.  Cardiology evaluation.  Hold Lasix today.  Fluid bolus  with acute kidney injury.  Restart Lasix tomorrow.  Patient on beta-blocker.  Stop losartan 3.  Acute kidney injury on chronic kidney disease stage IIIb.  Creatinine 2.01 today with baseline creatinine 1.57.  We will give a fluid bolus hold Lasix today and hold losartan. 4.  COPD.  Continue inhalers and nebulizer treatments 5.  Depression on Lexapro 6.  Hyponatremia.  We will give fluid bolus today.  Recheck sodium tomorrow. 7.  Patient is a DO NOT RESUSCITATE and followed by palliative care as outpatient.  All the records laboratory and radiological data are reviewed and case discussed with ED  provider. Management plans discussed with the patient, family and they are in agreement.  Patient meets inpatient status criteria left hip fracture and likely will be in the hospital 3 nights after hip fracture surgery tomorrow.  CODE STATUS: DNR  TOTAL TIME TAKING CARE OF THIS PATIENT: 50 minutes.    Loletha Grayer M.D on 05/15/2021 at 4:15 PM  Between 7am to 6pm - Pager - [phone]  After 6pm call admission pager [phone]  [facility]  CC: Primary care physician; Chrismon, Vickki Muff, PA-C

## 2021-05-15 NOTE — Consult Note (Signed)
CARDIOLOGY CONSULT NOTE               Patient ID: Marilyn Oconnell MRN: [id number] DOB/AGE: 1924-03-18 87 y.o.  Admit date: 05/15/2021 Referring Physician Loletha Grayer hospitalist Primary Physician Fannie Knee primary Primary Cardiologist  Reason for Consultation preop clearance for hip surgery  HPI: Patient denies 12-year-old female fell suffering a hip fracture history of chronic diastolic heart failure COPD dementia atrial fibrillation patient states presented to hospital after the fall she had some nausea and vomiting normally walks with a walker.  Patient presented also with feeling dizziness having had a fall currently lives in assisted living denies any chest pain denies any blackout spells with loss of balance and fell  Review of systems complete and found to be negative unless listed above     Past Medical History:  Diagnosis Date   Arthritis    CHF (congestive heart failure) (HCC)    COPD (chronic obstructive pulmonary disease) ([place])    Coronary artery disease    Hypercholesteremia    Hypertension     Past Surgical History:  Procedure Laterality Date   AORTIC VALVE REPLACEMENT  2006   DILATION AND CURETTAGE OF UTERUS  1988   SHOULDER SURGERY  09/1999   SIGMOIDOSCOPY  1992    (Not in a hospital admission)  Social History   Socioeconomic History   Marital status: Widowed    Spouse name: Not on file   Number of children: Not on file   Years of education: grade 8   Highest education level: Not on file  Occupational History   Not on file  Tobacco Use   Smoking status: Never   Smokeless tobacco: Never  Substance and Sexual Activity   Alcohol use: No   Drug use: No   Sexual activity: Not on file  Other Topics Concern   Not on file  Social History Narrative   Not on file   Social Determinants of Health   Financial Resource Strain: Not on file  Food Insecurity: Not on file  Transportation Needs: Not on file  Physical Activity: Not on file   Stress: Not on file  Social Connections: Not on file  Intimate Partner Violence: Not on file    Family History  Problem Relation Age of Onset   AAA (abdominal aortic aneurysm) Father    Lung cancer Brother    Prostate cancer Brother    Heart failure Brother       Review of systems complete and found to be negative unless listed above      PHYSICAL EXAM  General: Well developed, well nourished, in no acute distress HEENT:  Normocephalic and atramatic Neck:  No JVD.  Lungs: Clear bilaterally to auscultation and percussion. Heart: HRRR . Normal S1 and S2 without gallops or murmurs.  Abdomen: Bowel sounds are positive, abdomen soft and non-tender  Msk:  Back normal, normal gait. Normal strength and tone for age. Extremities: No clubbing, cyanosis or edema.   Neuro: Alert and oriented X 3. Psych:  Good affect, responds appropriately  Labs:   Lab Results  Component Value Date   WBC 12.3 (H) 05/15/2021   HGB 10.7 (L) 05/15/2021   HCT 30.8 (L) 05/15/2021   MCV 91.9 05/15/2021   PLT 205 05/15/2021    Recent Labs  Lab 05/15/21 1501  NA 130*  K 4.3  CL 95*  CO2 25  BUN 43*  CREATININE 2.01*  CALCIUM 9.2  GLUCOSE 155*   Lab Results  Component  Value Date   CKTOTAL 73 05/15/2021   TROPONINI 0.06 (H) 11/28/2015    Lab Results  Component Value Date   CHOL 233 (H) 07/01/2020   CHOL 166 07/14/2018   CHOL 168 01/15/2016   Lab Results  Component Value Date   HDL 55 07/01/2020   HDL 62 07/14/2018   HDL 60 01/15/2016   Lab Results  Component Value Date   LDLCALC 167 (H) 07/01/2020   LDLCALC 86 07/14/2018   LDLCALC 87 01/15/2016   Lab Results  Component Value Date   TRIG 57 07/01/2020   TRIG 88 07/14/2018   TRIG 104 01/15/2016   Lab Results  Component Value Date   CHOLHDL 4.2 07/01/2020   CHOLHDL 2.7 07/14/2018   CHOLHDL 2.7 10/11/2015   No results found for: LDLDIRECT    Radiology: DG Chest 1 View  Result Date: 05/15/2021 CLINICAL DATA:  Right  hip fracture. EXAM: CHEST  1 VIEW COMPARISON:  March 22, 2020. FINDINGS: Stable cardiomegaly. No pneumothorax is noted. Status post aortic valve repair. Mild central pulmonary vascular congestion is noted. Minimal bibasilar subsegmental atelectasis is noted. Status post right shoulder arthroplasty. IMPRESSION: Stable cardiomegaly with mild central pulmonary vascular congestion is noted. Minimal bibasilar subsegmental atelectasis. Electronically Signed   By: Marijo Conception M.D.   On: 05/15/2021 13:28   CT HEAD WO CONTRAST (5MM)  Result Date: 05/15/2021 CLINICAL DATA:  Polytrauma. Critical. Head and cervical spine injury. Fell at facility. EXAM: CT HEAD WITHOUT CONTRAST CT CERVICAL SPINE WITHOUT CONTRAST TECHNIQUE: Multidetector CT imaging of the head and cervical spine was performed following the standard protocol without intravenous contrast. Multiplanar CT image reconstructions of the cervical spine were also generated. COMPARISON:  04/18/2021 FINDINGS: CT HEAD FINDINGS Brain: Age related atrophy. Extensive chronic small-vessel ischemic changes of the white matter. No sign acute infarction, mass lesion, hemorrhage, hydrocephalus or extra-axial collection. Vascular: There is atherosclerotic calcification of the major vessels at the base of the brain. Skull: No skull fracture. Sinuses/Orbits: No fluid in the sinuses. No evidence of orbital injury. Other: None CT CERVICAL SPINE FINDINGS Alignment: Exaggerated cervical lordosis. 2-3 mm degenerative anterolisthesis at C6-7, unchanged from prior. Skull base and vertebrae: No regional fracture. Soft tissues and spinal canal: No evidence of traumatic soft tissue finding. Disc levels: Degenerative disc disease and degenerative facet disease throughout the region. Moderate bony foraminal stenosis, unchanged from previous studies. Upper chest: Negative Other: None IMPRESSION: Head CT: Atrophy and chronic small-vessel ischemic changes. No acute or traumatic finding  Cervical spine CT: Chronic degenerative changes, stable over prior is scratch that is stable since prior exams. No acute or traumatic finding. Electronically Signed   By: Nelson Chimes M.D.   On: 05/15/2021 14:07   CT Head Wo Contrast  Result Date: 04/18/2021 CLINICAL DATA:  Head trauma, minor (Age >= 65y).  Hit head EXAM: CT HEAD WITHOUT CONTRAST TECHNIQUE: Contiguous axial images were obtained from the base of the skull through the vertex without intravenous contrast. COMPARISON:  03/05/2021 FINDINGS: Brain: Physiologic calcifications in the basal ganglia. There is atrophy and chronic small vessel disease changes. No acute intracranial abnormality. Specifically, no hemorrhage, hydrocephalus, mass lesion, acute infarction, or significant intracranial injury. Vascular: No hyperdense vessel or unexpected calcification. Skull: No acute calvarial abnormality. Sinuses/Orbits: Visualized paranasal sinuses and mastoids clear. Orbital soft tissues unremarkable. Other: None IMPRESSION: Atrophy, chronic microvascular disease. No acute intracranial abnormality. Electronically Signed   By: Rolm Baptise M.D.   On: 04/18/2021 11:50   CT Cervical Spine  Wo Contrast  Result Date: 05/15/2021 CLINICAL DATA:  Polytrauma. Critical. Head and cervical spine injury. Fell at facility. EXAM: CT HEAD WITHOUT CONTRAST CT CERVICAL SPINE WITHOUT CONTRAST TECHNIQUE: Multidetector CT imaging of the head and cervical spine was performed following the standard protocol without intravenous contrast. Multiplanar CT image reconstructions of the cervical spine were also generated. COMPARISON:  04/18/2021 FINDINGS: CT HEAD FINDINGS Brain: Age related atrophy. Extensive chronic small-vessel ischemic changes of the white matter. No sign acute infarction, mass lesion, hemorrhage, hydrocephalus or extra-axial collection. Vascular: There is atherosclerotic calcification of the major vessels at the base of the brain. Skull: No skull fracture.  Sinuses/Orbits: No fluid in the sinuses. No evidence of orbital injury. Other: None CT CERVICAL SPINE FINDINGS Alignment: Exaggerated cervical lordosis. 2-3 mm degenerative anterolisthesis at C6-7, unchanged from prior. Skull base and vertebrae: No regional fracture. Soft tissues and spinal canal: No evidence of traumatic soft tissue finding. Disc levels: Degenerative disc disease and degenerative facet disease throughout the region. Moderate bony foraminal stenosis, unchanged from previous studies. Upper chest: Negative Other: None IMPRESSION: Head CT: Atrophy and chronic small-vessel ischemic changes. No acute or traumatic finding Cervical spine CT: Chronic degenerative changes, stable over prior is scratch that is stable since prior exams. No acute or traumatic finding. Electronically Signed   By: Nelson Chimes M.D.   On: 05/15/2021 14:07   CT Cervical Spine Wo Contrast  Result Date: 04/18/2021 CLINICAL DATA:  Neck trauma (Age >= 65y).  Fall. EXAM: CT CERVICAL SPINE WITHOUT CONTRAST TECHNIQUE: Multidetector CT imaging of the cervical spine was performed without intravenous contrast. Multiplanar CT image reconstructions were also generated. COMPARISON:  04/04/2021 FINDINGS: Alignment: Slight degenerative anterolisthesis of C6 on C7, stable. Skull base and vertebrae: No acute fracture. No primary bone lesion or focal pathologic process. Soft tissues and spinal canal: No prevertebral fluid or swelling. No visible canal hematoma. Disc levels: Diffuse degenerative disc disease and facet disease, unchanged, advanced. Upper chest: No acute findings Other: None IMPRESSION: Diffuse degenerative disc and facet disease. No acute bony abnormality. Electronically Signed   By: Rolm Baptise M.D.   On: 04/18/2021 11:52   CT Hip Right Wo Contrast  Result Date: 04/18/2021 CLINICAL DATA:  Right hip pain after fall.  Pubic fractures on x-ray EXAM: CT OF THE RIGHT HIP WITHOUT CONTRAST TECHNIQUE: Multidetector CT imaging of the  right hip was performed according to the standard protocol. Multiplanar CT image reconstructions were also generated. COMPARISON:  X-ray 04/18/2021 FINDINGS: Bones/Joint/Cartilage Acute minimally displaced fracture at the right superior pubic ramus/pubic root (series 6, images 31-37). Acute mildly comminuted and minimally displaced fracture of the midportion of the right inferior pubic ramus (series 2, image 63). No parasymphyseal pubic bone fracture. Pubic symphysis intact. Right hip joint is intact without dislocation. Proximal femur intact without fracture. No evidence of avascular necrosis. Trace right hip joint effusion, nonspecific. Ligaments Suboptimally assessed by CT. Muscles and Tendons There is some fatty infiltration with right gluteus minimus muscle. No acute musculotendinous abnormality is evident by CT. Soft tissues No pelvic sidewall hematoma. No hematoma seen within the soft tissues about the right hip. Extensive calcified atherosclerotic disease. Calcified uterine fibroids are present. 2.7 cm simple right adnexal cyst incidentally noted. IMPRESSION: 1. Acute minimally displaced fracture at the right superior pubic ramus/pubic root. 2. Acute mildly comminuted and minimally displaced fracture of the right inferior pubic ramus. 3. Trace right hip joint effusion, nonspecific. 4. Incidentally noted 2.7 cm simple right adnexal cyst, likely benign. No follow-up  imaging recommended. Note: This recommendation does not apply to premenarchal patients and to those with increased risk (genetic, family history, elevated tumor markers or other high-risk factors) of ovarian cancer. Reference: JACR 2020 Feb; 17(2):248-254 Electronically Signed   By: Davina Poke D.O.   On: 04/18/2021 12:51   DG Hip Unilat W or Wo Pelvis 2-3 Views Right  Result Date: 05/15/2021 CLINICAL DATA:  Status post fall. EXAM: DG HIP (WITH OR WITHOUT PELVIS) 2-3V RIGHT COMPARISON:  04/18/2021 FINDINGS: There is an acute, impacted  intertrochanteric fracture involving the proximal right femur. No dislocation identified. Healing fractures involving the right superior and inferior pubic rami are again identified. IMPRESSION: Acute, impacted intertrochanteric fracture of the proximal right femur. Electronically Signed   By: Kerby Moors M.D.   On: 05/15/2021 12:32   DG Hip Unilat  With Pelvis 2-3 Views Right  Result Date: 04/18/2021 CLINICAL DATA:  [place]. Right hip pain. EXAM: DG HIP (WITH OR WITHOUT PELVIS) 2-3V RIGHT COMPARISON:  Radiographs 01/14/2017. FINDINGS: Both hips are normally located. No acute hip fracture or plain film evidence of AVN. The pubic symphysis and SI joints are intact. Superior and inferior pubic rami fractures are noted on the right side. Advanced vascular calcifications are noted along with calcified uterine fibroids. IMPRESSION: 1. Superior and inferior pubic rami fractures on the right. 2. No hip fracture. Electronically Signed   By: Marijo Sanes M.D.   On: 04/18/2021 11:35    EKG: Normal sinus rhythm LVH nonspecific ST-T changes  ASSESSMENT AND PLAN:  Preoperative clearance for hip Severe aortic stenosis Acute renal insufficiency COPD Hyponatremia Depression Fall dental . Plan Continue preoperative clearance for orthopedic procedure Agree with cardiac clearance because of severe aortic stenosis Recommend maintain adequate hydration preop for the procedure Inhalers supplemental oxygen as necessary Commend cautious diuresis for heart failure Mild renal sufficiency and maintain adequate hydration Appears to be an acceptable risk for hip surgery moderate to high risk risk   Signed: Yolonda Kida MD 05/15/2021, 6:03 PM

## 2021-05-15 NOTE — Progress Notes (Signed)
Patient's BP elevated upon coming to floor. Dr. Earleen Newport notified of BP. MD states to give pain medication then recheck vitals. MD also ordered BP meds to give later. Night shift nurse notified. Pain medication given to patient. Patent resting in bed comfortably.   05/15/21 1848  Assess: MEWS Score  Temp 97.9 F (36.6 C)  BP (!) 202/72  Pulse Rate 79  Resp 15  Level of Consciousness Alert  SpO2 94 %  O2 Device Room Air

## 2021-05-15 NOTE — Progress Notes (Signed)
Brief Cardiology Consult   Impression Pre -op THR AS severe COPD CHF-D AFIB Dementia . Plan Pt cleared as High Acceptable cardiac risk for  urgent THR Maintain hydration pre and post op Rec  continue b-blocker pre and post-op Inhalers for COPD Continue Bp control Low dose lasix post op DVT prophylaxis PT/OT post op

## 2021-05-15 NOTE — ED Triage Notes (Signed)
Presents via EMS with right hip pain  per EMS found on the floor this am

## 2021-05-15 NOTE — ED Provider Notes (Signed)
[facility] Emergency Department Provider Note  ____________________________________________   Event Date/Time   First MD Initiated Contact with Patient 05/15/21 1110     (approximate)  I have reviewed the triage vital signs and the nursing notes.   HISTORY  Chief Complaint Fall  HPI Marilyn Oconnell is a 87 y.o. female with the below medical history, presents to the ED via EMS from her facility, where she had a mechanical fall.  EMS was called out, and found the patient on the floor when they arrived.  Patient complains of right hip pain.  Report later comes to the, that the patient apparently was found by staff, had hit her head, and had some associated vomiting.  It is unclear how long the patient may have been down.  Past Medical History:  Diagnosis Date   Arthritis    CHF (congestive heart failure) (HCC)    COPD (chronic obstructive pulmonary disease) (HCC)    Coronary artery disease    Hypercholesteremia    Hypertension     Patient Active Problem List   Diagnosis Date Noted   Fall 06/30/2020   Hypertension    Coronary artery disease    COPD (chronic obstructive pulmonary disease) (HCC)    Hypertensive urgency    Elevated troponin    PAF (paroxysmal atrial fibrillation) (HCC)    Chronic diastolic CHF (congestive heart failure) ([place])    Current mild episode of major depressive disorder, unspecified whether recurrent ([place]) 04/25/2020   Advanced age 45/09/2019   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   Blindness 03/23/2020   Atrial fibrillation with RVR (HCC) 03/22/2020   Chronic pain syndrome 03/16/2020   Closed nondisplaced fracture of surgical neck of left humerus 03/16/2020   Chronic left-sided headaches 03/16/2020   Occipital neuralgia of left side 03/16/2020   Syncope 07/20/2019   Recurrent syncope 07/19/2019   Insomnia 03/18/2016   Visual hallucinations 03/18/2016   Vertigo 03/15/2016   Paroxysmal atrial fibrillation with RVR ([place])  11/27/2015   Depression 11/27/2015   Adjustment disorder with disturbance of emotion 09/28/2015   Severe aortic stenosis 09/28/2015   Bilateral cataracts 09/28/2015   Cardiac murmur 09/28/2015   Acute renal failure superimposed on stage 3b chronic kidney disease ([place]) 09/28/2015   History of gout 09/28/2015   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/2015   OP (osteoporosis) 09/28/2015   Atherosclerosis of coronary artery 09/08/2015   Avitaminosis D 07/11/2015   LBP (low back pain) 07/11/2015   BP (high blood pressure) 07/11/2015   HLD (hyperlipidemia) 04/12/2015   Billowing mitral valve 08/03/2014   Fibrosis of lung ([place]) 05/25/2014   Asthma-chronic obstructive pulmonary disease overlap syndrome ([place]) 05/25/2014   H/O aortic valve replacement 11/09/2004    Past Surgical History:  Procedure Laterality Date   AORTIC VALVE REPLACEMENT  2006   DILATION AND CURETTAGE OF UTERUS  1988   SHOULDER SURGERY  09/1999   SIGMOIDOSCOPY  1992    Prior to Admission medications   Medication Sig Start Date End Date Taking? Authorizing Provider  acetaminophen (TYLENOL) 500 MG tablet Take 500-1,000 mg by mouth 2 (two) times daily as needed for mild pain, moderate pain, fever or headache.     [provider]  albuterol (PROVENTIL) (2.5 MG/3ML) 0.083% nebulizer solution Take 2.5 mg by nebulization every 8 (eight) hours as needed for wheezing or shortness of breath.     [provider]  budesonide (PULMICORT) 0.5 MG/2ML nebulizer solution Take 0.5 mg by nebulization at bedtime.    [provider]  diclofenac Sodium (VOLTAREN) 1 % GEL Apply topically as needed.  03/29/20   [provider]  escitalopram (LEXAPRO) 20 MG tablet Take 1 tablet (20 mg total) by mouth daily. 03/28/21   Birdie Sons, MD  fexofenadine (ALLEGRA) 180 MG tablet Take 180 mg by mouth daily.    [provider]  Fluticasone-Salmeterol (ADVAIR) 250-50 MCG/DOSE AEPB INHALE ONE PUFF INTO LUNGS EVERY 12 HOURS.  RINSE MOUTH AFTER USE 12/20/20   Trinna Post, PA-C  furosemide (LASIX) 20 MG tablet Take 1 tablet (20 mg total) by mouth daily. 02/13/21   Chrismon, Simona Huh E, PA-C  hydrALAZINE (APRESOLINE) 10 MG tablet TAKE 1 TABLET BY MOUTH 2 TIMES DAILY 04/12/21   Virginia Crews, MD  ipratropium-albuterol (DUONEB) 0.5-2.5 (3) MG/3ML SOLN Take 3 mLs by nebulization 3 (three) times daily. 03/27/20   Swayze, Ava, DO  losartan (COZAAR) 25 MG tablet Take 0.5 tablets (12.5 mg total) by mouth daily. 03/28/21   Birdie Sons, MD  meloxicam (MOBIC) 15 MG tablet Take 15 mg by mouth daily.    [provider]  meloxicam (MOBIC) 7.5 MG tablet Take 7.5 mg by mouth daily. 04/06/21   [provider]  metoprolol succinate (TOPROL-XL) 50 MG 24 hr tablet TAKE 1 TABLET BY MOUTH  DAILY WITH OR IMMEDIATELY  FOLLOWING A MEAL 07/05/20   Carles Collet M, PA-C    Allergies Iodine, Shellfish allergy, Azithromycin, and Sulfa antibiotics  Family History  Problem Relation Age of Onset   Aneurysm Father    Lung cancer Brother    Prostate cancer Brother    Heart failure Brother     Social History Social History   Tobacco Use   Smoking status: Never   Smokeless tobacco: Never  Substance Use Topics   Alcohol use: No   Drug use: No    Review of Systems  Constitutional: No fever/chills Eyes: No visual changes. ENT: No sore throat. Cardiovascular: Denies chest pain. Respiratory: Denies shortness of breath. Gastrointestinal: No abdominal pain.  No nausea, no vomiting.  No diarrhea.  No constipation. Genitourinary: Negative for dysuria. Musculoskeletal: Negative for back pain. Right hip pain as above Skin: Negative for rash. Neurological: Negative for headaches, focal weakness or numbness. ____________________________________________   PHYSICAL EXAM:  VITAL SIGNS: ED Triage Vitals  Enc Vitals Group     BP 05/15/21 1115 (!) 192/61     Pulse Rate 05/15/21 1115 67     Resp 05/15/21 1115 (!)  25     Temp 05/15/21 1115 98.2 F (36.8 C)     Temp Source 05/15/21 1115 Oral     SpO2 05/15/21 1115 98 %     Weight 05/15/21 1112 112 lb 14 oz (51.2 kg)     Height 05/15/21 1112 5\' 2"  (1.575 m)     Head Circumference --      Peak Flow --      Pain Score 05/15/21 1112 5     Pain Loc --      Pain Edu? --      Excl. in [place]? --     Constitutional: Alert and oriented. Well appearing and in no acute distress. Eyes: Conjunctivae are normal. PERRL. EOMI. Head: Atraumatic. Nose: No congestion/rhinnorhea. Mouth/Throat: Mucous membranes are moist.  Oropharynx non-erythematous. Neck: No stridor.   Cardiovascular: Normal rate, regular rhythm. Grossly normal heart sounds.  Good peripheral circulation. Respiratory: Normal respiratory effort.  No retractions. Lungs CTAB. Gastrointestinal: Soft and nontender. No distention. No abdominal bruits. No  CVA tenderness. Musculoskeletal: Right lower extremity tenderness at the lateral hip. nor edema.  No joint effusions. Neurologic:  Normal speech and language. No gross focal neurologic deficits are appreciated. No gait instability. Skin:  Skin is warm, dry and intact. No rash noted. Psychiatric: Mood and affect are normal. Speech and behavior are normal.  ____________________________________________   LABS (all labs ordered are listed, but only abnormal results are displayed)  Labs Reviewed  CBC WITH DIFFERENTIAL/PLATELET - Abnormal; Notable for the following components:      Result Value   WBC 12.3 (*)    RBC 3.35 (*)    Hemoglobin 10.7 (*)    HCT 30.8 (*)    Neutro Abs 11.3 (*)    Lymphs Abs 0.5 (*)    All other components within normal limits  RESP PANEL BY RT-PCR (FLU A&B, COVID) ARPGX2  BASIC METABOLIC PANEL  URINALYSIS, COMPLETE (UACMP) WITH MICROSCOPIC  CK   ____________________________________________  EKG  Vent. rate 68 BPM PR interval 168 ms QRS duration 88 ms QT/QTcB 438/465 ms P-R-T axes 76 73 83 No  STEMI ____________________________________________  RADIOLOGY I, Melvenia Needles, personally viewed and evaluated these images (plain radiographs) as part of my medical decision making, as well as reviewing the written report by the radiologist.  ED MD interpretation:  agree with reports  Official radiology report(s): DG Chest 1 View  Result Date: 05/15/2021 CLINICAL DATA:  Right hip fracture. EXAM: CHEST  1 VIEW COMPARISON:  March 22, 2020. FINDINGS: Stable cardiomegaly. No pneumothorax is noted. Status post aortic valve repair. Mild central pulmonary vascular congestion is noted. Minimal bibasilar subsegmental atelectasis is noted. Status post right shoulder arthroplasty. IMPRESSION: Stable cardiomegaly with mild central pulmonary vascular congestion is noted. Minimal bibasilar subsegmental atelectasis. Electronically Signed   By: Marijo Conception M.D.   On: 05/15/2021 13:28   CT HEAD WO CONTRAST (5MM)  Result Date: 05/15/2021 CLINICAL DATA:  Polytrauma. Critical. Head and cervical spine injury. Fell at facility. EXAM: CT HEAD WITHOUT CONTRAST CT CERVICAL SPINE WITHOUT CONTRAST TECHNIQUE: Multidetector CT imaging of the head and cervical spine was performed following the standard protocol without intravenous contrast. Multiplanar CT image reconstructions of the cervical spine were also generated. COMPARISON:  04/18/2021 FINDINGS: CT HEAD FINDINGS Brain: Age related atrophy. Extensive chronic small-vessel ischemic changes of the white matter. No sign acute infarction, mass lesion, hemorrhage, hydrocephalus or extra-axial collection. Vascular: There is atherosclerotic calcification of the major vessels at the base of the brain. Skull: No skull fracture. Sinuses/Orbits: No fluid in the sinuses. No evidence of orbital injury. Other: None CT CERVICAL SPINE FINDINGS Alignment: Exaggerated cervical lordosis. 2-3 mm degenerative anterolisthesis at C6-7, unchanged from prior. Skull base and vertebrae:  No regional fracture. Soft tissues and spinal canal: No evidence of traumatic soft tissue finding. Disc levels: Degenerative disc disease and degenerative facet disease throughout the region. Moderate bony foraminal stenosis, unchanged from previous studies. Upper chest: Negative Other: None IMPRESSION: Head CT: Atrophy and chronic small-vessel ischemic changes. No acute or traumatic finding Cervical spine CT: Chronic degenerative changes, stable over prior is scratch that is stable since prior exams. No acute or traumatic finding. Electronically Signed   By: Nelson Chimes M.D.   On: 05/15/2021 14:07   CT Cervical Spine Wo Contrast  Result Date: 05/15/2021 CLINICAL DATA:  Polytrauma. Critical. Head and cervical spine injury. Fell at facility. EXAM: CT HEAD WITHOUT CONTRAST CT CERVICAL SPINE WITHOUT CONTRAST TECHNIQUE: Multidetector CT imaging of the head and cervical spine was  performed following the standard protocol without intravenous contrast. Multiplanar CT image reconstructions of the cervical spine were also generated. COMPARISON:  04/18/2021 FINDINGS: CT HEAD FINDINGS Brain: Age related atrophy. Extensive chronic small-vessel ischemic changes of the white matter. No sign acute infarction, mass lesion, hemorrhage, hydrocephalus or extra-axial collection. Vascular: There is atherosclerotic calcification of the major vessels at the base of the brain. Skull: No skull fracture. Sinuses/Orbits: No fluid in the sinuses. No evidence of orbital injury. Other: None CT CERVICAL SPINE FINDINGS Alignment: Exaggerated cervical lordosis. 2-3 mm degenerative anterolisthesis at C6-7, unchanged from prior. Skull base and vertebrae: No regional fracture. Soft tissues and spinal canal: No evidence of traumatic soft tissue finding. Disc levels: Degenerative disc disease and degenerative facet disease throughout the region. Moderate bony foraminal stenosis, unchanged from previous studies. Upper chest: Negative Other: None  IMPRESSION: Head CT: Atrophy and chronic small-vessel ischemic changes. No acute or traumatic finding Cervical spine CT: Chronic degenerative changes, stable over prior is scratch that is stable since prior exams. No acute or traumatic finding. Electronically Signed   By: Nelson Chimes M.D.   On: 05/15/2021 14:07   DG Hip Unilat W or Wo Pelvis 2-3 Views Right  Result Date: 05/15/2021 CLINICAL DATA:  Status post fall. EXAM: DG HIP (WITH OR WITHOUT PELVIS) 2-3V RIGHT COMPARISON:  04/18/2021 FINDINGS: There is an acute, impacted intertrochanteric fracture involving the proximal right femur. No dislocation identified. Healing fractures involving the right superior and inferior pubic rami are again identified. IMPRESSION: Acute, impacted intertrochanteric fracture of the proximal right femur. Electronically Signed   By: Kerby Moors M.D.   On: 05/15/2021 12:32    ____________________________________________   PROCEDURES  Procedure(s) performed (including Critical Care):  Procedures  Fentanyl 50 mcg IM x 1  ____________________________________________   INITIAL IMPRESSION / ASSESSMENT AND PLAN / ED COURSE  As part of my medical decision making, I reviewed the following data within the [place] reviewed as above, Radiograph reviewed as noted, Discussed with admitting physician Wieting MD, A consult was requested and obtained from this/these consultant(s) Orthopedics, and Notes from prior ED visits  Geriatric patient ED evaluation of mechanical fall presenting to the ED via EMS with complaints of right hip pain.  Patient was evaluated for complaints, and found to have an acute impacted right femoral neck fracture.  Patient was further evaluated with CT imaging and base labs which are normal and reassuring at this time.  Patient will be admitted to the hospitalist service for Ortho consultation and planned surgical intervention.  Patient's niece has been notified via phone,  the patient's caregiver is at bedside.   ----------------------------------------- 2:16 PM on 05/15/2021 ----------------------------------------- Rudene Christians: Admit to hospitalist service. He will plan surgical intervention tomorrow.   ----------------------------------------- 3:25 PM on 05/15/2021 ----------------------------------------- S/W Wieting: he will admit to the hospitalist service.  ____________________________________________   FINAL CLINICAL IMPRESSION(S) / ED DIAGNOSES  Final diagnoses:  Fall in home, initial encounter  Closed nondisplaced intertrochanteric fracture of right femur, initial encounter [facility])     ED Discharge Orders     None        Note:  This document was prepared using Dragon voice recognition software and may include unintentional dictation errors.    Melvenia Needles, PA-C 05/15/21 1530    Nena Polio, MD 05/15/21 [phone]

## 2021-05-16 ENCOUNTER — Inpatient Hospital Stay: Payer: Medicare Other | Admitting: Anesthesiology

## 2021-05-16 ENCOUNTER — Encounter
Admission: EM | Disposition: A | Discharge status: Skilled Nursing Facility | Payer: Self-pay | Source: Home / Self Care | Attending: Internal Medicine

## 2021-05-16 ENCOUNTER — Encounter: Payer: Self-pay | Admitting: Internal Medicine

## 2021-05-16 ENCOUNTER — Inpatient Hospital Stay: Payer: Medicare Other

## 2021-05-16 DIAGNOSIS — H543 Unqualified visual loss, both eyes: Secondary | ICD-10-CM

## 2021-05-16 DIAGNOSIS — S72001A Fracture of unspecified part of neck of right femur, initial encounter for closed fracture: Secondary | ICD-10-CM

## 2021-05-16 DIAGNOSIS — F3289 Other specified depressive episodes: Secondary | ICD-10-CM | POA: Diagnosis not present

## 2021-05-16 DIAGNOSIS — I480 Paroxysmal atrial fibrillation: Secondary | ICD-10-CM

## 2021-05-16 DIAGNOSIS — I10 Essential (primary) hypertension: Secondary | ICD-10-CM

## 2021-05-16 DIAGNOSIS — N179 Acute kidney failure, unspecified: Secondary | ICD-10-CM | POA: Diagnosis not present

## 2021-05-16 HISTORY — PX: INTRAMEDULLARY (IM) NAIL INTERTROCHANTERIC: SHX5875

## 2021-05-16 LAB — CBC
HCT: 31.5 % — ABNORMAL LOW (ref 36.0–46.0)
Hemoglobin: 10.4 g/dL — ABNORMAL LOW (ref 12.0–15.0)
MCH: 30.3 pg (ref 26.0–34.0)
MCHC: 33 g/dL (ref 30.0–36.0)
MCV: 91.8 fL (ref 80.0–100.0)
Platelets: 209 10*3/uL (ref 150–400)
RBC: 3.43 MIL/uL — ABNORMAL LOW (ref 3.87–5.11)
RDW: 13.3 % (ref 11.5–15.5)
WBC: 18 10*3/uL — ABNORMAL HIGH (ref 4.0–10.5)
nRBC: 0 % (ref 0.0–0.2)

## 2021-05-16 LAB — CREATININE, SERUM
Creatinine, Ser: 1.83 mg/dL — ABNORMAL HIGH (ref 0.44–1.00)
GFR, Estimated: 25 mL/min — ABNORMAL LOW (ref >60)

## 2021-05-16 IMAGING — XA DG HIP (WITH PELVIS) OPERATIVE*R*
3 series · 3 of 3 positions shown · non-contrast
Comparison: Preoperative imaging yesterday.

CLINICAL DATA: IM nail.

EXAM:
OPERATIVE RIGHT HIP (WITH PELVIS IF PERFORMED)
TECHNIQUE: Fluoroscopic spot image(s) were submitted for interpretation
post-operatively.

[Series 9: ortho standard · 1 of 1 slices shown (1 of 3)]
[im 1/1]
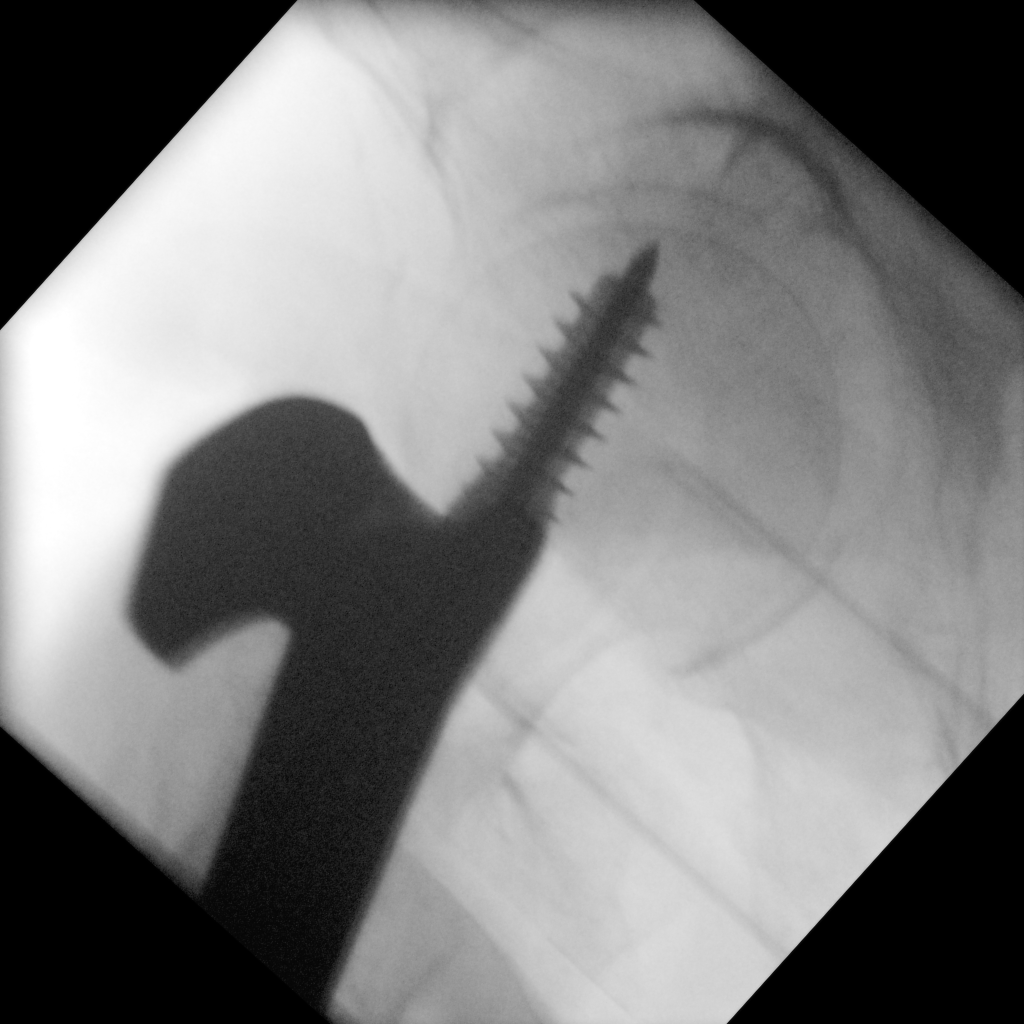

[Series 10: ortho standard · 1 of 1 slices shown (2 of 3)]
[im 1/1]
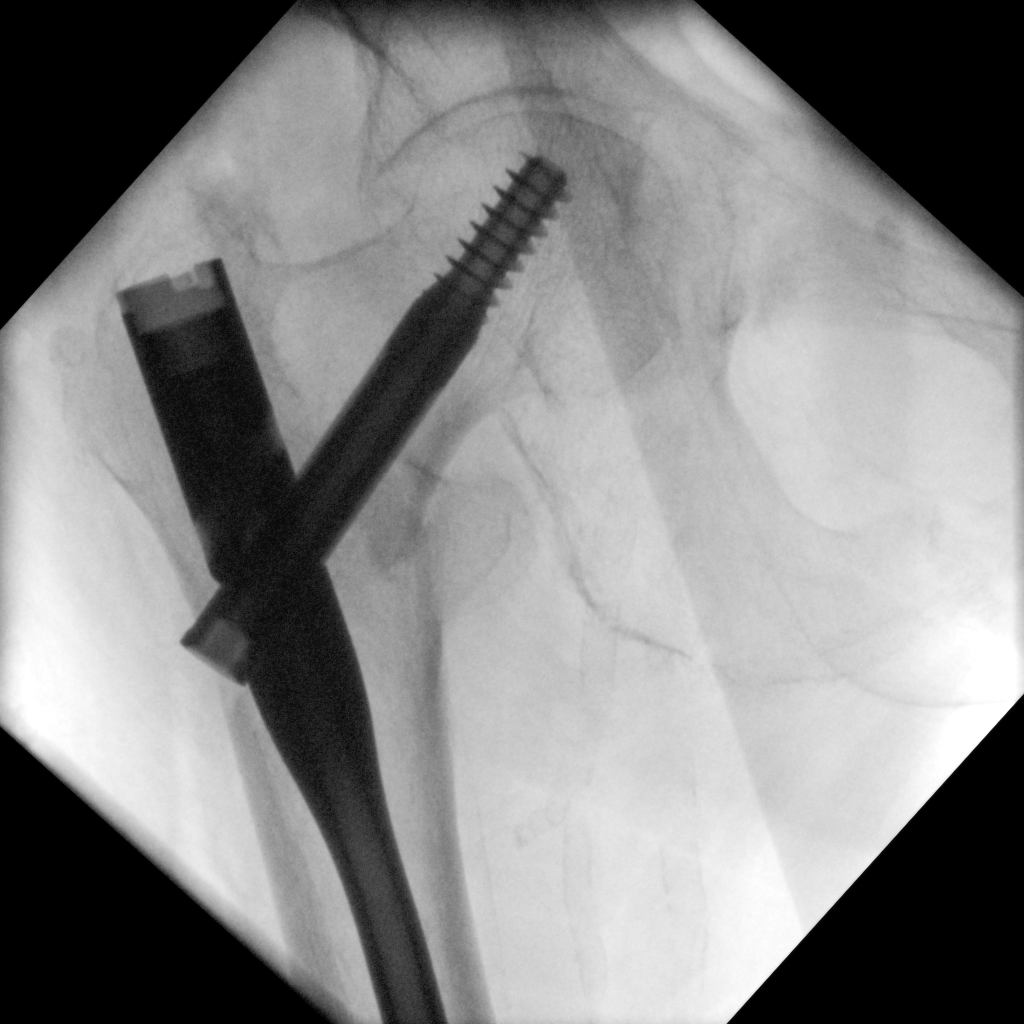

[Series 13: ortho standard · 1 of 1 slices shown (3 of 3)]
[im 1/1]
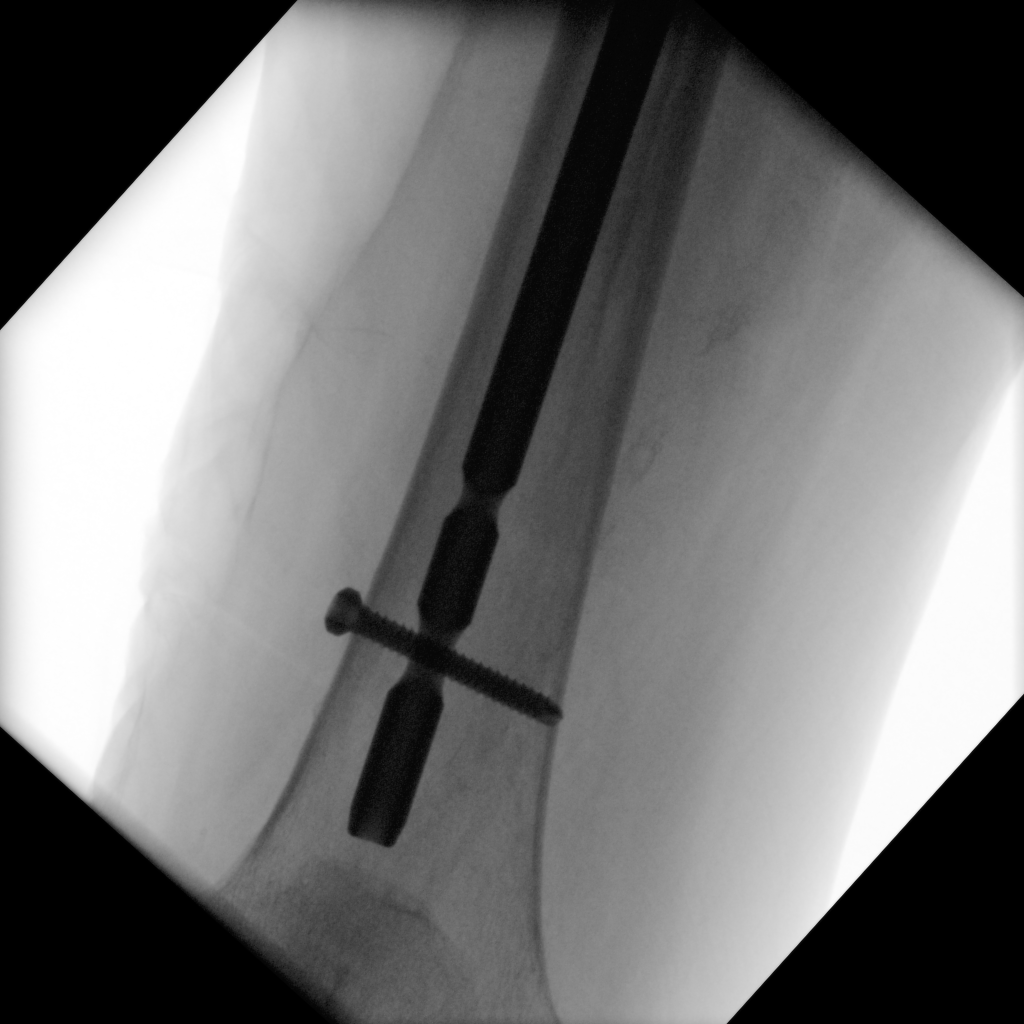

[3 of 3 positions shown; findings below may reference images not displayed]

FINDINGS: Three fluoroscopic spot views of the right hip and femur obtained in
the operating room. Intramedullary nail with distal locking and
trans trochanteric screw fixation of proximal femur fracture.
Fluoroscopy time 1 minutes.
IMPRESSION: Procedural fluoroscopy for right femur IM nail.

## 2021-05-16 SURGERY — FIXATION, FRACTURE, INTERTROCHANTERIC, WITH INTRAMEDULLARY ROD
Anesthesia: General | Site: Hip | Laterality: Right

## 2021-05-16 MED ORDER — LACTATED RINGERS IV SOLN: INTRAVENOUS | Status: DC | PRN

## 2021-05-16 MED ORDER — HYDRALAZINE HCL 20 MG/ML IJ SOLN
INTRAMUSCULAR | Status: AC
  Filled 2021-05-16: qty 1

## 2021-05-16 MED ORDER — DEXTROSE-NACL 5-0.9 % IV SOLN: INTRAVENOUS | Status: DC

## 2021-05-16 MED ORDER — ETOMIDATE 2 MG/ML IV SOLN
INTRAVENOUS | Status: DC | PRN
  Administered 2021-05-16: 10 mg via INTRAVENOUS

## 2021-05-16 MED ORDER — OXYCODONE HCL 5 MG PO TABS
5.0000 mg | ORAL_TABLET | ORAL | Status: DC | PRN
Start: 2021-05-16 — End: 2021-05-21
  Administered 2021-05-17 – 2021-05-18 (×2): 5 mg via ORAL
  Filled 2021-05-16 (×2): qty 1

## 2021-05-16 MED ORDER — KETAMINE HCL 50 MG/ML IJ SOLN
INTRAMUSCULAR | Status: DC | PRN
  Administered 2021-05-16: 20 mg via INTRAVENOUS

## 2021-05-16 MED ORDER — HYDRALAZINE HCL 20 MG/ML IJ SOLN
5.0000 mg | Freq: Once | INTRAMUSCULAR | Status: AC
  Administered 2021-05-16: 5 mg via INTRAVENOUS

## 2021-05-16 MED ORDER — IPRATROPIUM-ALBUTEROL 0.5-2.5 (3) MG/3ML IN SOLN
3.0000 mL | Freq: Two times a day (BID) | RESPIRATORY_TRACT | Status: DC
  Filled 2021-05-16: qty 3

## 2021-05-16 MED ORDER — NEOMYCIN-POLYMYXIN B GU 40-200000 IR SOLN
Status: AC
  Filled 2021-05-16: qty 2

## 2021-05-16 MED ORDER — DEXAMETHASONE SODIUM PHOSPHATE 10 MG/ML IJ SOLN
INTRAMUSCULAR | Status: AC
  Filled 2021-05-16: qty 1

## 2021-05-16 MED ORDER — ACETAMINOPHEN 325 MG PO TABS
650.0000 mg | ORAL_TABLET | Freq: Four times a day (QID) | ORAL | Status: DC
  Administered 2021-05-16 – 2021-05-21 (×19): 650 mg via ORAL
  Filled 2021-05-16 (×21): qty 2

## 2021-05-16 MED ORDER — PHENYLEPHRINE HCL (PRESSORS) 10 MG/ML IV SOLN
INTRAVENOUS | Status: AC
  Filled 2021-05-16: qty 1

## 2021-05-16 MED ORDER — ONDANSETRON HCL 4 MG/2ML IJ SOLN
INTRAMUSCULAR | Status: DC | PRN
  Administered 2021-05-16: 4 mg via INTRAVENOUS

## 2021-05-16 MED ORDER — ALUM & MAG HYDROXIDE-SIMETH 200-200-20 MG/5ML PO SUSP: 30.0000 mL | ORAL | Status: DC | PRN

## 2021-05-16 MED ORDER — FENTANYL CITRATE (PF) 100 MCG/2ML IJ SOLN
INTRAMUSCULAR | Status: AC
  Administered 2021-05-16: 25 ug via INTRAVENOUS
  Filled 2021-05-16: qty 2

## 2021-05-16 MED ORDER — ONDANSETRON HCL 4 MG/2ML IJ SOLN: 4.0000 mg | Freq: Four times a day (QID) | INTRAMUSCULAR | Status: DC | PRN

## 2021-05-16 MED ORDER — SUCCINYLCHOLINE CHLORIDE 200 MG/10ML IV SOSY
PREFILLED_SYRINGE | INTRAVENOUS | Status: DC | PRN
  Administered 2021-05-16: 100 mg via INTRAVENOUS

## 2021-05-16 MED ORDER — SUCCINYLCHOLINE CHLORIDE 200 MG/10ML IV SOSY
PREFILLED_SYRINGE | INTRAVENOUS | Status: AC
  Filled 2021-05-16: qty 10

## 2021-05-16 MED ORDER — SODIUM CHLORIDE 0.9 % IV SOLN: INTRAVENOUS | Status: DC

## 2021-05-16 MED ORDER — METOCLOPRAMIDE HCL 5 MG/ML IJ SOLN: 5.0000 mg | Freq: Three times a day (TID) | INTRAMUSCULAR | Status: DC | PRN

## 2021-05-16 MED ORDER — CEFAZOLIN SODIUM-DEXTROSE 2-4 GM/100ML-% IV SOLN
2.0000 g | Freq: Two times a day (BID) | INTRAVENOUS | Status: AC
  Administered 2021-05-16 – 2021-05-17 (×2): 2 g via INTRAVENOUS
  Filled 2021-05-16 (×2): qty 100

## 2021-05-16 MED ORDER — BISACODYL 10 MG RE SUPP: 10.0000 mg | Freq: Every day | RECTAL | Status: DC | PRN

## 2021-05-16 MED ORDER — KETAMINE HCL 50 MG/5ML IJ SOSY
PREFILLED_SYRINGE | INTRAMUSCULAR | Status: AC
  Filled 2021-05-16: qty 5

## 2021-05-16 MED ORDER — DEXAMETHASONE SODIUM PHOSPHATE 10 MG/ML IJ SOLN
INTRAMUSCULAR | Status: DC | PRN
  Administered 2021-05-16: 4 mg via INTRAVENOUS

## 2021-05-16 MED ORDER — ONDANSETRON HCL 4 MG/2ML IJ SOLN
INTRAMUSCULAR | Status: AC
  Filled 2021-05-16: qty 2

## 2021-05-16 MED ORDER — MORPHINE SULFATE (PF) 2 MG/ML IV SOLN
1.0000 mg | INTRAVENOUS | Status: DC | PRN
  Administered 2021-05-20: 1 mg via INTRAVENOUS
  Filled 2021-05-16: qty 1

## 2021-05-16 MED ORDER — SODIUM CHLORIDE 0.9 % IV SOLN
Status: DC | PRN
  Administered 2021-05-16: 500 mL

## 2021-05-16 MED ORDER — CEFAZOLIN SODIUM-DEXTROSE 1-4 GM/50ML-% IV SOLN
INTRAVENOUS | Status: AC
  Filled 2021-05-16: qty 50

## 2021-05-16 MED ORDER — MAGNESIUM HYDROXIDE 400 MG/5ML PO SUSP
30.0000 mL | Freq: Every day | ORAL | Status: DC | PRN
  Administered 2021-05-17 – 2021-05-19 (×2): 30 mL via ORAL
  Filled 2021-05-16 (×2): qty 30

## 2021-05-16 MED ORDER — DOCUSATE SODIUM 100 MG PO CAPS
100.0000 mg | ORAL_CAPSULE | Freq: Two times a day (BID) | ORAL | Status: DC
  Administered 2021-05-16 – 2021-05-21 (×10): 100 mg via ORAL
  Filled 2021-05-16 (×10): qty 1

## 2021-05-16 MED ORDER — ENOXAPARIN SODIUM 30 MG/0.3ML IJ SOSY
30.0000 mg | PREFILLED_SYRINGE | INTRAMUSCULAR | Status: DC
  Administered 2021-05-17 – 2021-05-21 (×5): 30 mg via SUBCUTANEOUS
  Filled 2021-05-16 (×5): qty 0.3

## 2021-05-16 MED ORDER — METOCLOPRAMIDE HCL 10 MG PO TABS: 5.0000 mg | ORAL_TABLET | Freq: Three times a day (TID) | ORAL | Status: DC | PRN

## 2021-05-16 MED ORDER — ONDANSETRON HCL 4 MG PO TABS: 4.0000 mg | ORAL_TABLET | Freq: Four times a day (QID) | ORAL | Status: DC | PRN

## 2021-05-16 MED ORDER — FENTANYL CITRATE (PF) 100 MCG/2ML IJ SOLN: 25.0000 ug | INTRAMUSCULAR | Status: DC | PRN

## 2021-05-16 MED ORDER — MENTHOL 3 MG MT LOZG
1.0000 | LOZENGE | OROMUCOSAL | Status: DC | PRN
  Filled 2021-05-16: qty 9

## 2021-05-16 MED ORDER — ETOMIDATE 2 MG/ML IV SOLN
INTRAVENOUS | Status: AC
  Filled 2021-05-16: qty 10

## 2021-05-16 MED ORDER — PHENOL 1.4 % MT LIQD
1.0000 | OROMUCOSAL | Status: DC | PRN
  Filled 2021-05-16: qty 177

## 2021-05-16 SURGICAL SUPPLY — 38 items
BIT DRILL 4.3MMS DISTAL GRDTED (BIT) ×1 IMPLANT
BNDG COHESIVE 4X5 TAN ST LF (GAUZE/BANDAGES/DRESSINGS) ×4 IMPLANT
CANISTER SUCT 1200ML W/VALVE (MISCELLANEOUS) ×2 IMPLANT
CHLORAPREP W/TINT 26 (MISCELLANEOUS) ×2 IMPLANT
DRAPE 3/4 80X56 (DRAPES) ×2 IMPLANT
DRAPE U-SHAPE 47X51 STRL (DRAPES) ×2 IMPLANT
DRILL 4.3MMS DISTAL GRADUATED (BIT) ×2
DRSG OPSITE POSTOP 3X4 (GAUZE/BANDAGES/DRESSINGS) ×6 IMPLANT
DRSG OPSITE POSTOP 4X6 (GAUZE/BANDAGES/DRESSINGS) IMPLANT
GAUZE 4X4 16PLY ~~LOC~~+RFID DBL (SPONGE) IMPLANT
GLOVE SURG SYN 9.0  PF PI (GLOVE) ×1
GLOVE SURG SYN 9.0 PF PI (GLOVE) ×1 IMPLANT
GLOVE SURG UNDER POLY LF SZ9 (GLOVE) ×2 IMPLANT
GOWN SRG 2XL LVL 4 RGLN SLV (GOWNS) ×1 IMPLANT
GOWN STRL NON-REIN 2XL LVL4 (GOWNS) ×1
GOWN STRL REUS W/ TWL LRG LVL3 (GOWN DISPOSABLE) ×1 IMPLANT
GOWN STRL REUS W/TWL LRG LVL3 (GOWN DISPOSABLE) ×1
GUIDEPIN VERSANAIL DSP 3.2X444 (ORTHOPEDIC DISPOSABLE SUPPLIES) ×2 IMPLANT
GUIDEWIRE BALL NOSE 100CM (WIRE) ×2 IMPLANT
HFN LAG SCREW 10.5MM X 85MM (Screw) ×2 IMPLANT
HFN RH 130 DEG 9MM X 340MM (Nail) ×2 IMPLANT
KIT TURNOVER KIT A (KITS) ×2 IMPLANT
MANIFOLD NEPTUNE II (INSTRUMENTS) ×2 IMPLANT
MAT ABSORB  FLUID 56X50 GRAY (MISCELLANEOUS) ×1
MAT ABSORB FLUID 56X50 GRAY (MISCELLANEOUS) ×1 IMPLANT
NEEDLE FILTER BLUNT 18X 1/2SAF (NEEDLE)
NEEDLE FILTER BLUNT 18X1 1/2 (NEEDLE) IMPLANT
NS IRRIG 500ML POUR BTL (IV SOLUTION) ×2 IMPLANT
PACK HIP COMPR (MISCELLANEOUS) ×2 IMPLANT
SCALPEL PROTECTED #15 DISP (BLADE) ×4 IMPLANT
SCREW BONE CORTICAL 5.0X40 (Screw) ×2 IMPLANT
SPONGE T-LAP 18X18 ~~LOC~~+RFID (SPONGE) ×4 IMPLANT
STAPLER SKIN PROX 35W (STAPLE) ×2 IMPLANT
SUT VIC AB 1 CT1 36 (SUTURE) ×2 IMPLANT
SUT VIC AB 2-0 CT1 (SUTURE) ×2 IMPLANT
SYR 10ML LL (SYRINGE) ×2 IMPLANT
SYR BULB IRRIG 60ML STRL (SYRINGE) ×2 IMPLANT
WATER STERILE IRR 500ML POUR (IV SOLUTION) IMPLANT

## 2021-05-16 NOTE — Progress Notes (Signed)
PHARMACY NOTE:  ANTIMICROBIAL RENAL DOSAGE ADJUSTMENT  Current antimicrobial regimen includes a mismatch between antimicrobial dosage and estimated renal function.  As per policy approved by the Pharmacy & Therapeutics and Medical Executive Committees, the antimicrobial dosage will be adjusted accordingly.  Current antimicrobial dosage:  cefazolin 2 g q6h x 3  Indication: surgical prophylaxis  Renal Function:  Estimated Creatinine Clearance: 13.2 mL/min (A) (by C-G formula based on SCr of 2.01 mg/dL (H)).    Antimicrobial dosage has been changed to:  cefazolin 2 g q12h x 2    Thank you for allowing pharmacy to be a part of this patient's care.  Tawnya Crook, PharmD, BCPS Clinical Pharmacist 05/16/2021 6:28 PM

## 2021-05-16 NOTE — Progress Notes (Signed)
[facility] Cardiology    SUBJECTIVE: Patient resting comfortably complains of pain in right hip when she moves no chest pain has underlying chronic shortness of breath denies any significant palpitations or tachycardia no fever chills or sweats   Vitals:   05/15/21 2214 05/15/21 2348 05/16/21 0514 05/16/21 0745  BP: (!) 166/65 (!) 141/51 112/73 (!) 195/84  Pulse: 74 68 64 67  Resp: 17 19 19 17   Temp: 98.1 F (36.7 C) 98.8 F (37.1 C) 98.5 F (36.9 C) 98.1 F (36.7 C)  TempSrc: Oral   Oral  SpO2: 92% 95% 95% 94%  Weight:      Height:        No intake or output data in the 24 hours ending 05/16/21 6270    PHYSICAL EXAM  General: Well developed, well nourished, in no acute distress HEENT:  Normocephalic and atramatic Neck:  No JVD.  Lungs: Clear bilaterally to auscultation and percussion. Heart: HRRR . Normal S1 and S2 without gallops or 3/6 sem murmurs.  Abdomen: Bowel sounds are positive, abdomen soft and non-tender  Msk:  Back normal, normal gait. Normal strength and tone for age. Extremities: No clubbing, cyanosis or edema.   Neuro: Alert and oriented X 3. Psych:  Good affect, responds appropriately   LABS: Basic Metabolic Panel: Recent Labs    05/15/21 1501  NA 130*  K 4.3  CL 95*  CO2 25  GLUCOSE 155*  BUN 43*  CREATININE 2.01*  CALCIUM 9.2   Liver Function Tests: No results for input(s): AST, ALT, ALKPHOS, BILITOT, PROT, ALBUMIN in the last 72 hours. No results for input(s): LIPASE, AMYLASE in the last 72 hours. CBC: Recent Labs    05/15/21 1501  WBC 12.3*  NEUTROABS 11.3*  HGB 10.7*  HCT 30.8*  MCV 91.9  PLT 205   Cardiac Enzymes: Recent Labs    05/15/21 1501  CKTOTAL 73   BNP: Invalid input(s): POCBNP D-Dimer: No results for input(s): DDIMER in the last 72 hours. Hemoglobin A1C: No results for input(s): HGBA1C in the last 72 hours. Fasting Lipid Panel: No results for input(s): CHOL, HDL, LDLCALC, TRIG, CHOLHDL, LDLDIRECT in the last 72  hours. Thyroid Function Tests: No results for input(s): TSH, T4TOTAL, T3FREE, THYROIDAB in the last 72 hours.  Invalid input(s): FREET3 Anemia Panel: No results for input(s): VITAMINB12, FOLATE, FERRITIN, TIBC, IRON, RETICCTPCT in the last 72 hours.  DG Chest 1 View  Result Date: 05/15/2021 CLINICAL DATA:  Right hip fracture. EXAM: CHEST  1 VIEW COMPARISON:  March 22, 2020. FINDINGS: Stable cardiomegaly. No pneumothorax is noted. Status post aortic valve repair. Mild central pulmonary vascular congestion is noted. Minimal bibasilar subsegmental atelectasis is noted. Status post right shoulder arthroplasty. IMPRESSION: Stable cardiomegaly with mild central pulmonary vascular congestion is noted. Minimal bibasilar subsegmental atelectasis. Electronically Signed   By: Marijo Conception M.D.   On: 05/15/2021 13:28   CT HEAD WO CONTRAST (5MM)  Result Date: 05/15/2021 CLINICAL DATA:  Polytrauma. Critical. Head and cervical spine injury. Fell at facility. EXAM: CT HEAD WITHOUT CONTRAST CT CERVICAL SPINE WITHOUT CONTRAST TECHNIQUE: Multidetector CT imaging of the head and cervical spine was performed following the standard protocol without intravenous contrast. Multiplanar CT image reconstructions of the cervical spine were also generated. COMPARISON:  04/18/2021 FINDINGS: CT HEAD FINDINGS Brain: Age related atrophy. Extensive chronic small-vessel ischemic changes of the white matter. No sign acute infarction, mass lesion, hemorrhage, hydrocephalus or extra-axial collection. Vascular: There is atherosclerotic calcification of the major vessels at  the base of the brain. Skull: No skull fracture. Sinuses/Orbits: No fluid in the sinuses. No evidence of orbital injury. Other: None CT CERVICAL SPINE FINDINGS Alignment: Exaggerated cervical lordosis. 2-3 mm degenerative anterolisthesis at C6-7, unchanged from prior. Skull base and vertebrae: No regional fracture. Soft tissues and spinal canal: No evidence of traumatic  soft tissue finding. Disc levels: Degenerative disc disease and degenerative facet disease throughout the region. Moderate bony foraminal stenosis, unchanged from previous studies. Upper chest: Negative Other: None IMPRESSION: Head CT: Atrophy and chronic small-vessel ischemic changes. No acute or traumatic finding Cervical spine CT: Chronic degenerative changes, stable over prior is scratch that is stable since prior exams. No acute or traumatic finding. Electronically Signed   By: Nelson Chimes M.D.   On: 05/15/2021 14:07   CT Cervical Spine Wo Contrast  Result Date: 05/15/2021 CLINICAL DATA:  Polytrauma. Critical. Head and cervical spine injury. Fell at facility. EXAM: CT HEAD WITHOUT CONTRAST CT CERVICAL SPINE WITHOUT CONTRAST TECHNIQUE: Multidetector CT imaging of the head and cervical spine was performed following the standard protocol without intravenous contrast. Multiplanar CT image reconstructions of the cervical spine were also generated. COMPARISON:  04/18/2021 FINDINGS: CT HEAD FINDINGS Brain: Age related atrophy. Extensive chronic small-vessel ischemic changes of the white matter. No sign acute infarction, mass lesion, hemorrhage, hydrocephalus or extra-axial collection. Vascular: There is atherosclerotic calcification of the major vessels at the base of the brain. Skull: No skull fracture. Sinuses/Orbits: No fluid in the sinuses. No evidence of orbital injury. Other: None CT CERVICAL SPINE FINDINGS Alignment: Exaggerated cervical lordosis. 2-3 mm degenerative anterolisthesis at C6-7, unchanged from prior. Skull base and vertebrae: No regional fracture. Soft tissues and spinal canal: No evidence of traumatic soft tissue finding. Disc levels: Degenerative disc disease and degenerative facet disease throughout the region. Moderate bony foraminal stenosis, unchanged from previous studies. Upper chest: Negative Other: None IMPRESSION: Head CT: Atrophy and chronic small-vessel ischemic changes. No acute  or traumatic finding Cervical spine CT: Chronic degenerative changes, stable over prior is scratch that is stable since prior exams. No acute or traumatic finding. Electronically Signed   By: Nelson Chimes M.D.   On: 05/15/2021 14:07   DG Hip Unilat W or Wo Pelvis 2-3 Views Right  Result Date: 05/15/2021 CLINICAL DATA:  Status post fall. EXAM: DG HIP (WITH OR WITHOUT PELVIS) 2-3V RIGHT COMPARISON:  04/18/2021 FINDINGS: There is an acute, impacted intertrochanteric fracture involving the proximal right femur. No dislocation identified. Healing fractures involving the right superior and inferior pubic rami are again identified. IMPRESSION: Acute, impacted intertrochanteric fracture of the proximal right femur. Electronically Signed   By: Kerby Moors M.D.   On: 05/15/2021 12:32     Echo preserved left ventricular function with severe aortic stenosis  TELEMETRY: Normal sinus rhythm rate of 70:  ASSESSMENT AND PLAN:  Active Problems:   Acute kidney injury superimposed on CKD (HCC)   Hip fracture (HCC) Preop total hip Severe aortic stenosis Acute /chronic renal insufficiency Status post fall COPD Coronary artery disease Hyperlipidemia Hypertension History of atrial fibrillation Status post aortic valve replacement bioprosthetic valve  Plan Patient has been cleared as a moderate to high surgical risk for urgent hip surgery Maintain adequate hydration for aortic stenosis Continue beta-blockers pre and postop Rate control for atrial fibrillation with beta-blockers Do not recommend any invasive procedures to modify her risk Continue conservative cardiac management   Yolonda Kida, MD 05/16/2021 8:21 AM

## 2021-05-16 NOTE — Anesthesia Preprocedure Evaluation (Addendum)
Anesthesia Evaluation  Patient identified by MRN, date of birth, ID band Patient awake    Reviewed: Allergy & Precautions, H&P , NPO status , Patient's Chart, lab work & pertinent test results, reviewed documented beta blocker date and time   History of Anesthesia Complications Negative for: history of anesthetic complications  Airway Mallampati: II  TM Distance: >3 FB Neck ROM: full    Dental no notable dental hx.    Pulmonary shortness of breath and with exertion, asthma , neg sleep apnea, COPD,  COPD inhaler, neg recent URI,    Pulmonary exam normal breath sounds clear to auscultation       Cardiovascular Exercise Tolerance: Good hypertension, (-) angina+ CAD and +CHF  (-) Past MI, (-) Cardiac Stents and (-) CABG + dysrhythmias Atrial Fibrillation + Valvular Problems/Murmurs (severe AS) AS  Rhythm:regular Rate:Normal     Neuro/Psych PSYCHIATRIC DISORDERS Depression negative neurological ROS     GI/Hepatic negative GI ROS, Neg liver ROS,   Endo/Other  negative endocrine ROS  Renal/GU negative Renal ROS  negative genitourinary   Musculoskeletal   Abdominal   Peds  Hematology negative hematology ROS (+)   Anesthesia Other Findings Past Medical History: No date: Arthritis No date: CHF (congestive heart failure) (HCC) No date: COPD (chronic obstructive pulmonary disease) (HCC) No date: Coronary artery disease No date: Hypercholesteremia No date: Hypertension  ECHO (03/24/20) IMPRESSIONS   1. Left ventricular ejection fraction, by estimation, is 65 to 70%. The  left ventricle has normal function. The left ventricle has no regional  wall motion abnormalities. Left ventricular diastolic parameters are  consistent with Grade I diastolic  dysfunction (impaired relaxation).  2. Right ventricular systolic function is normal. The right ventricular  size is normal. There is moderately elevated pulmonary artery systolic   pressure.  3. The mitral valve is grossly normal. Mild mitral valve regurgitation.  4. The aortic valve is tricuspid. Aortic valve regurgitation is trivial.  Severe aortic valve stenosis.   Reproductive/Obstetrics negative OB ROS                            Anesthesia Physical Anesthesia Plan  ASA: 4  Anesthesia Plan: General   Post-op Pain Management:    Induction: Intravenous  PONV Risk Score and Plan: 3  Airway Management Planned: Oral ETT  Additional Equipment:   Intra-op Plan:   Post-operative Plan: Extubation in OR  Informed Consent: I have reviewed the patients History and Physical, chart, labs and discussed the procedure including the risks, benefits and alternatives for the proposed anesthesia with the patient or authorized representative who has indicated his/her understanding and acceptance.     Dental Advisory Given  Plan Discussed with: Anesthesiologist, CRNA and Surgeon  Anesthesia Plan Comments:        Anesthesia Quick Evaluation

## 2021-05-16 NOTE — Progress Notes (Signed)
Discussed with family.  Plan ORIF for pain relief, they understand high risk with her health condition.

## 2021-05-16 NOTE — Op Note (Signed)
05/15/2021 - 05/16/2021  4:12 PM  PATIENT:  Marilyn Oconnell  87 y.o. female  PRE-OPERATIVE DIAGNOSIS:  Right high intertrochanteric hip fracture  POST-OPERATIVE DIAGNOSIS:  *Right intertrochanteric hip fracture  PROCEDURE:  Procedure(s): INTRAMEDULLARY (IM) NAIL INTERTROCHANTRIC-Right Hip (Right)  SURGEON: Laurene Footman, MD  ASSISTANTS: None  ANESTHESIA:   general  EBL:  Total I/O In: 50 [IV Piggyback:50] Out: -   BLOOD ADMINISTERED:none  DRAINS: none   LOCAL MEDICATIONS USED:  NONE  SPECIMEN:  No Specimen  DISPOSITION OF SPECIMEN:  N/A  COUNTS:  YES  TOURNIQUET:  * No tourniquets in log *  IMPLANTS: Biomet affixes right 9 x 340 rod with 85 mm leg screw 40 mm distal interlocking screw  DICTATION: .Dragon Dictation patient was brought to the operating room and after adequate general anesthesia was obtained she was placed on the fracture table with the left leg in the well-leg holder.  Right foot was in traction boot and gentle traction applied.  C arm was brought in and good visualization of the fracture was obtained in AP and lateral projections.  The hip was prepped and draped using a barrier drape method and appropriate patient identification and timeout procedures were completed.  A small proximal incision was made and a guidewire inserted to the tip of the trochanter and proximal reaming carried out.  Long guidewire was inserted and measurement made and a 9 mm rod was then inserted without reaming to minimize postop bleeding.  When it is appropriate depth a guidewire was inserted and essentially center center position of the center of the head measurement made drilled and an 85 mm leg screw inserted with compression applied after releasing traction.  The setscrew was placed and the insertion handle removed.  The distal interlocking screw was placed through the slotted hole using a standard technique drilling measuring and placing the cortical screw.  All wounds were then  irrigated the proximal wound closed using #1 Vicryl deep 2-0 Vicryl subcutaneously and skin staples.  Honeycomb dressings applied and patient sent to recovery in stable condition  PLAN OF CARE: Admit to inpatient   PATIENT DISPOSITION:  PACU - hemodynamically stable.

## 2021-05-16 NOTE — Anesthesia Procedure Notes (Signed)
Procedure Name: Intubation Date/Time: 05/16/2021 3:45 PM Performed by: Rolla Plate, CRNA Pre-anesthesia Checklist: Patient identified, Patient being monitored, Timeout performed, Emergency Drugs available and Suction available Patient Re-evaluated:Patient Re-evaluated prior to induction Oxygen Delivery Method: Circle system utilized Preoxygenation: Pre-oxygenation with 100% oxygen Induction Type: IV induction and Rapid sequence Laryngoscope Size: Mac and 3 Grade View: Grade I Tube type: Oral Tube size: 6.5 mm Number of attempts: 1 Airway Equipment and Method: Stylet and Video-laryngoscopy Placement Confirmation: ETT inserted through vocal cords under direct vision, positive ETCO2 and breath sounds checked- equal and bilateral Secured at: 21 cm Tube secured with: Tape Dental Injury: Teeth and Oropharynx as per pre-operative assessment

## 2021-05-16 NOTE — Transfer of Care (Signed)
Immediate Anesthesia Transfer of Care Note  Patient: Marilyn Oconnell  Procedure(s) Performed: INTRAMEDULLARY (IM) NAIL INTERTROCHANTRIC-Right Hip (Right: Hip)  Patient Location: PACU  Anesthesia Type:General  Level of Consciousness: sedated  Airway & Oxygen Therapy: Patient Spontanous Breathing and Patient connected to face mask oxygen  Post-op Assessment: Report given to RN and Post -op Vital signs reviewed and stable  Post vital signs: Reviewed  Last Vitals:  Vitals Value Taken Time  BP 167/63 05/16/21 1615  Temp 36.3 C 05/16/21 1615  Pulse 69 05/16/21 1618  Resp 17 05/16/21 1618  SpO2 100 % 05/16/21 1618  Vitals shown include unvalidated device data.  Last Pain:  Vitals:   05/16/21 1449  TempSrc: Oral  PainSc:          Complications: No notable events documented.

## 2021-05-16 NOTE — Progress Notes (Signed)
This nurse notified Dr. Cathlean Sauer about NPO order and scheduled meds. MD states to give scheduled medications. Patient to be NPO except for meds.

## 2021-05-16 NOTE — Progress Notes (Signed)
PROGRESS NOTE    Marilyn Oconnell  VVO:[id number] DOB: 10/01/1923 DOA: 05/15/2021 PCP: Marilyn Common, PA-C    Brief Narrative:  Marilyn Oconnell was admitted to the hospital with the working diagnosis of right hip fracture.   87 year old female past medical history for severe arctic stenosis, chronic diastolic heart failure, COPD, dementia, legally blind, and atrial fibrillation who presented after suffering a mechanical fall.  Patient uses a walker for ambulation, she lives in assisted living facility but looking into long-term care.  Chronic and baseline dyspnea.  On her initial physical examination blood pressure 198/72, heart rate 65, temperature 98.2, respiratory rate 15, oxygen saturation 98%.  Her lungs were clear to auscultation bilaterally, heart S1-S2, present, rhythmic, 3/6 systolic murmur, abdomen soft, no lower extremity edema.  Sodium 130, potassium 4.3, chloride 95, bicarb 25, glucose 155, BUN 43, creatinine 2.0, white count 12.3, hemoglobin 10.7, hematocrit 30.8, platelets 205. SARS COVID-19 negative.  Urinalysis specific gravity 1.006, 0-5 white cells.   Right hip films with acute impacted intertrochanteric fracture of the proximal right femur.   CT head/cervical spine no acute changes.  Chest radiograph with increased lung markings bilaterally.  No infiltrates.  EKG 61 bpm, normal axis, normal intervals, sinus rhythm, no significant ST segment or T wave changes.  Patient has been evaluated by orthopedics, recommendation for open reduction internal fixation.  Assessment & Plan:   Principal Problem:   Hip fracture (Marilyn Oconnell) Active Problems:   HLD (hyperlipidemia)   Atherosclerosis of coronary artery   Severe aortic stenosis   Bilateral cataracts   Acute kidney injury superimposed on CKD (HCC)   Depression   Blindness   Hypertension   PAF (paroxysmal atrial fibrillation) (HCC)   Chronic diastolic CHF (congestive heart failure) (HCC)   Hyponatremia   Acute right  impacted intertrochanteric fracture of the proximal femur.  Patient continue to have pain at the right lower extremity, worse with movement.   Plan to continue pain control with IV morphine and oral oxycodone Scheduled acetaminophen. Surgical intervention this pm. Patient is medically stable for orthopedic intervention, her peri-operative risk is elevated due to advance age and aortic stenosis.  Consult nutrition and after surgery will consult PT and OT.   2. Diastolic heart failure and chronic aortic stenosis. HTN No clinical signs of decompensated heart failure. Continue blood pressure control with metoprolol and hydralazine   3. Paroxysmal atrial fibrillation. Currently rate controlled with metoprolol, sinus rhythm. Continue close heart rate monitoring and continue with metoprolol.   4. AKI on Ckd stage IV. Baseline cr is 1,5. Will continue gentle hydration with isotonic saline with dextrose at 75 ml per Hr and follow up renal function in am. Avoid hypotension and nephrotoxic medications.  Hold on furosemide for now.   5. COPD.  No clinical signs of exacerbation. Continue with budesonide and duoneb/ dulera.   6. Depression. Continue with escitalopram.   Patient continue to be at high risk for worsening renal failure, and elevated perioperative risk.   Status is: Inpatient  Remains inpatient appropriate because:Inpatient level of care appropriate due to severity of illness  Dispo: The patient is from: ALF              Anticipated d/c is to: SNF              Patient currently is not medically stable to d/c.   Difficult to place patient No   DVT prophylaxis: Heparin   Code Status:    DNR   Family Communication:  No family at the bedside       Consultants:  Cardiology  Orthopedics    Subjective: Patient very weal and deconditioned, continue to have pain at her right lower extremity, no nausea or vomiting, no chest pain or significant dyspnea.   Objective: Vitals:    05/15/21 2214 05/15/21 2348 05/16/21 0514 05/16/21 0745  BP: (!) 166/65 (!) 141/51 112/73 (!) 195/84  Pulse: 74 68 64 67  Resp: 17 19 19 17   Temp: 98.1 F (36.7 C) 98.8 F (37.1 C) 98.5 F (36.9 C) 98.1 F (36.7 C)  TempSrc: Oral   Oral  SpO2: 92% 95% 95% 94%  Weight:      Height:       No intake or output data in the 24 hours ending 05/16/21 0850 Filed Weights   05/15/21 1112 05/15/21 1848  Weight: 51.2 kg 52.8 kg    Examination:   General: Not in pain or dyspnea, deconditioned  Neurology: Awake and alert, non focal  E ENT: mild pallor, no icterus, oral mucosa dry Cardiovascular: No JVD. S1-S2 present, rhythmic, no gallops, rubs, 3/6 systolic murmurs. No lower extremity edema. Pulmonary: positive breath sounds bilaterally, with no wheezing, rhonchi or rales. Poor inspiratory effort.  Gastrointestinal. Abdomen soft and non tender Skin. No rashes Musculoskeletal: shortened right lower extremity     Data Reviewed: I have personally reviewed following labs and imaging studies  CBC: Recent Labs  Lab 05/15/21 1501  WBC 12.3*  NEUTROABS 11.3*  HGB 10.7*  HCT 30.8*  MCV 91.9  PLT 361   Basic Metabolic Panel: Recent Labs  Lab 05/15/21 1501  NA 130*  K 4.3  CL 95*  CO2 25  GLUCOSE 155*  BUN 43*  CREATININE 2.01*  CALCIUM 9.2   GFR: Estimated Creatinine Clearance: 13.2 mL/min (A) (by C-G formula based on SCr of 2.01 mg/dL (H)). Liver Function Tests: No results for input(s): AST, ALT, ALKPHOS, BILITOT, PROT, ALBUMIN in the last 168 hours. No results for input(s): LIPASE, AMYLASE in the last 168 hours. No results for input(s): AMMONIA in the last 168 hours. Coagulation Profile: No results for input(s): INR, PROTIME in the last 168 hours. Cardiac Enzymes: Recent Labs  Lab 05/15/21 1501  CKTOTAL 73   BNP (last 3 results) No results for input(s): PROBNP in the last 8760 hours. HbA1C: No results for input(s): HGBA1C in the last 72 hours. CBG: No results  for input(s): GLUCAP in the last 168 hours. Lipid Profile: No results for input(s): CHOL, HDL, LDLCALC, TRIG, CHOLHDL, LDLDIRECT in the last 72 hours. Thyroid Function Tests: No results for input(s): TSH, T4TOTAL, FREET4, T3FREE, THYROIDAB in the last 72 hours. Anemia Panel: No results for input(s): VITAMINB12, FOLATE, FERRITIN, TIBC, IRON, RETICCTPCT in the last 72 hours.    Radiology Studies: I have reviewed all of the imaging during this hospital visit personally     Scheduled Meds:  budesonide  0.5 mg Nebulization QHS   escitalopram  20 mg Oral QPM   furosemide  20 mg Oral Daily   hydrALAZINE  10 mg Oral BID   ipratropium-albuterol  3 mL Nebulization TID   loratadine  10 mg Oral Daily   metoprolol succinate  25 mg Oral Daily   mometasone-formoterol  2 puff Inhalation BID   mupirocin ointment  1 application Nasal BID   Continuous Infusions:   ceFAZolin (ANCEF) IV       LOS: 1 day        Kyvon Hu Gerome Apley, MD

## 2021-05-16 NOTE — Progress Notes (Signed)
[place] [facility]) Hospital Liaison note:  This patient is currently enrolled in [facility] outpatient-based Palliative Care. Will continue to follow for disposition.  Please call with any outpatient palliative questions or concerns.  Thank you, Lorelee Market, LPN [facility] Liaison [phone]

## 2021-05-16 NOTE — Anesthesia Postprocedure Evaluation (Signed)
Anesthesia Post Note  Patient: Marilyn Oconnell  Procedure(s) Performed: INTRAMEDULLARY (IM) NAIL INTERTROCHANTRIC-Right Hip (Right: Hip)  Patient location during evaluation: PACU Anesthesia Type: General Level of consciousness: awake and alert Pain management: pain level controlled Vital Signs Assessment: post-procedure vital signs reviewed and stable Respiratory status: spontaneous breathing, nonlabored ventilation, respiratory function stable and patient connected to nasal cannula oxygen Cardiovascular status: blood pressure returned to baseline and stable Postop Assessment: no apparent nausea or vomiting Anesthetic complications: no   No notable events documented.   Last Vitals:  Vitals:   05/16/21 1655 05/16/21 1700  BP:  (!) 159/88  Pulse: 80 74  Resp: 16 18  Temp:    SpO2: 97% 97%    Last Pain:  Vitals:   05/16/21 1700  TempSrc:   PainSc: [name]

## 2021-05-17 ENCOUNTER — Encounter: Payer: Self-pay | Admitting: Orthopedic Surgery

## 2021-05-17 DIAGNOSIS — S72001A Fracture of unspecified part of neck of right femur, initial encounter for closed fracture: Secondary | ICD-10-CM | POA: Diagnosis not present

## 2021-05-17 DIAGNOSIS — N179 Acute kidney failure, unspecified: Secondary | ICD-10-CM | POA: Diagnosis not present

## 2021-05-17 DIAGNOSIS — H269 Unspecified cataract: Secondary | ICD-10-CM

## 2021-05-17 DIAGNOSIS — I5032 Chronic diastolic (congestive) heart failure: Secondary | ICD-10-CM | POA: Diagnosis not present

## 2021-05-17 LAB — BASIC METABOLIC PANEL
Anion gap: 12 (ref 5–15)
BUN: 41 mg/dL — ABNORMAL HIGH (ref 8–23)
CO2: 25 mmol/L (ref 22–32)
Calcium: 8.4 mg/dL — ABNORMAL LOW (ref 8.9–10.3)
Chloride: 99 mmol/L (ref 98–111)
Creatinine, Ser: 2.12 mg/dL — ABNORMAL HIGH (ref 0.44–1.00)
GFR, Estimated: 21 mL/min — ABNORMAL LOW (ref >60)
Glucose, Bld: 123 mg/dL — ABNORMAL HIGH (ref 70–99)
Potassium: 4.6 mmol/L (ref 3.5–5.1)
Sodium: 136 mmol/L (ref 135–145)

## 2021-05-17 LAB — CBC
HCT: 25.9 % — ABNORMAL LOW (ref 36.0–46.0)
Hemoglobin: 8.6 g/dL — ABNORMAL LOW (ref 12.0–15.0)
MCH: 30.7 pg (ref 26.0–34.0)
MCHC: 33.2 g/dL (ref 30.0–36.0)
MCV: 92.5 fL (ref 80.0–100.0)
Platelets: 171 10*3/uL (ref 150–400)
RBC: 2.8 MIL/uL — ABNORMAL LOW (ref 3.87–5.11)
RDW: 13.5 % (ref 11.5–15.5)
WBC: 10.7 10*3/uL — ABNORMAL HIGH (ref 4.0–10.5)
nRBC: 0 % (ref 0.0–0.2)

## 2021-05-17 MED ORDER — ENSURE ENLIVE PO LIQD
237.0000 mL | Freq: Three times a day (TID) | ORAL | Status: DC
  Administered 2021-05-17 – 2021-05-21 (×12): 237 mL via ORAL

## 2021-05-17 MED ORDER — IPRATROPIUM-ALBUTEROL 0.5-2.5 (3) MG/3ML IN SOLN
3.0000 mL | Freq: Four times a day (QID) | RESPIRATORY_TRACT | Status: DC | PRN
  Administered 2021-05-19: 3 mL via RESPIRATORY_TRACT
  Filled 2021-05-17: qty 3

## 2021-05-17 MED ORDER — POLYETHYLENE GLYCOL 3350 17 G PO PACK
17.0000 g | PACK | Freq: Every day | ORAL | Status: DC
  Administered 2021-05-17 – 2021-05-21 (×5): 17 g via ORAL
  Filled 2021-05-17 (×6): qty 1

## 2021-05-17 NOTE — Progress Notes (Signed)
Initial Nutrition Assessment  DOCUMENTATION CODES:  Severe malnutrition in context of social or environmental circumstances  INTERVENTION:  Continue current diet as ordered, encourage PO intake.  RN and NT to assist pt with meals Ensure Enlive po TID, each supplement provides 350 kcal and 20 grams of protein Magic cup TID with meals, each supplement provides 290 kcal and 9 grams of protein  NUTRITION DIAGNOSIS:  Severe Malnutrition related to social / environmental circumstances as evidenced by severe muscle depletion, severe fat depletion.  GOAL:  Patient will meet greater than or equal to 90% of their needs  MONITOR:  PO intake, Supplement acceptance  REASON FOR ASSESSMENT:  Consult Assessment of nutrition requirement/status  ASSESSMENT:  87 y.o. female with medical history of CHF, COPD, CAD, HTN and HLD, presented to ED via EMS from her facility after a fall. Found to have an acute impacted right femoral neck fracture. Taken for surgical repair 8/24  Pt resting in bed at the time of assessment. Sleepy, but would answer some questions. Caregiver at bedside. Breakfast tray at bedside, minimally consumed with what appears only to have a few bites of oatmeal consumed. Pt states she is unsure how her appetite is. Does deny GI distress at this time. Agreeable to ensure, prefers strawberry. Did take a few sips after bottle was opened and stated it tasted good. Will add between meals. IVF running at 93mL/h does provide some kcal (204kcal/d).  Nutritionally Relevant Medications: Scheduled Meds:  docusate sodium  100 mg Oral BID   polyethylene glycol  17 g Oral Daily   Continuous Infusions:  dextrose 5 % and 0.9% NaCl 50 mL/hr at 05/17/21 1007   PRN Meds: alum & mag hydroxide-simeth, bisacodyl, magnesium hydroxide, metoCLOPramide, ondansetron  Labs Reviewed: BUN 41, creatinine 2.12  NUTRITION - FOCUSED PHYSICAL EXAM: Flowsheet Row Most Recent Value  Orbital Region Severe  depletion  Upper Arm Region Moderate depletion  Thoracic and Lumbar Region Moderate depletion  Buccal Region Severe depletion  Temple Region Moderate depletion  Clavicle Bone Region Severe depletion  Clavicle and Acromion Bone Region Severe depletion  Scapular Bone Region Severe depletion  Dorsal Hand Severe depletion  Patellar Region Moderate depletion  Anterior Thigh Region Moderate depletion  Posterior Calf Region Severe depletion  Edema (RD Assessment) Mild  Hair Reviewed  Eyes Reviewed  Mouth Reviewed  Skin Reviewed  Nails Reviewed   Diet Order:   Diet Order             Diet regular Room service appropriate? Yes; Fluid consistency: Thin  Diet effective now                  EDUCATION NEEDS:  No education needs have been identified at this time  Skin:  Skin Assessment: Skin Integrity Issues: Skin Integrity Issues:: Incisions Incisions: right hip  Last BM:  unsure  Height:  Ht Readings from Last 1 Encounters:  05/15/21 5\' 2"  (1.575 m)    Weight:  Wt Readings from Last 1 Encounters:  05/15/21 52.8 kg    Ideal Body Weight:  50 kg  BMI:  Body mass index is 21.29 kg/m.  Estimated Nutritional Needs:  Kcal:  1500-1700 kcal/d Protein:  75-85 g/d Fluid:  1.5-1.8L/d  Ranell Patrick, RD, LDN Clinical Dietitian Pager on [place]

## 2021-05-17 NOTE — Evaluation (Signed)
Occupational Therapy Evaluation Patient Details Name: Marilyn Oconnell MRN: [id number] DOB: August 15, 1924 Today's Date: 05/17/2021    History of Present Illness Pt is a 87 y.o. female presenting to hospital 8/23 from facility s/p mechanical fall (pt found on floor and c/o R hip pain, had hit her head, and had associated vomiting).  Imaging (+) for acute impacted intertrochanteric fx of proximal R femur.  S/p R hip IMN intertrochanteric 8/24.  Of note, pt with superior and inferior pubic rami fx's R side (imaged 04/18/2021).  PMH includes CHF, COPD, CAD, htn, PAF, [name], legally blind, closed nondisplaced fx of surgical neck of L humerus 03/16/20, chronic L sided HA's, syncope, vertigo, LBP, h/o aortic valve replacement, and shoulder surgery 09/1999.   Clinical Impression   Patient presenting with Ind in self care, balance, functional mobility/transfer, endurance, and safety awareness. Pt is legally blind and lives in ALF. She has assistance with self care and performs minimal ambulation with RW. Pt is agreeable to OT intervention but is very fatigues. She needs max A to get to EOB with min guard - min A for static sitting balance. Standing and functional transfers deferred secondary to pt's reports of nausea. Patient will benefit from acute OT to increase overall independence in the areas of ADLs, functional mobility, and safety awareness in order to safely discharge to next venue of care.    Follow Up Recommendations  SNF    Equipment Recommendations  Other (comment) (defer to next venue of care)       Precautions / Restrictions Precautions Precautions: Fall Precaution Comments: Legally blind Restrictions Weight Bearing Restrictions: Yes RLE Weight Bearing: Weight bearing as tolerated      Mobility Bed Mobility Overal bed mobility: Needs Assistance Bed Mobility: Supine to Sit;Sit to Supine     Supine to sit: Max assist Sit to supine: Max assist;Total assist   General  bed mobility comments: Pt very fatigued and required increased assistance to return to bed    Transfers                 General transfer comment: not attempted secondary to nausea    Balance Overall balance assessment: Needs assistance Sitting-balance support: No upper extremity supported;Feet supported Sitting balance-Leahy Scale: Fair Sitting balance - Comments: steady sitting reaching within BOS                                   ADL either performed or assessed with clinical judgement   ADL Overall ADL's : Needs assistance/impaired                                       General ADL Comments: max- total A for LB self care and min A for UB self care. Max A for bed mobility     Vision Baseline Vision/History: 2 Legally blind Patient Visual Report: No change from baseline              Pertinent Vitals/Pain Pain Assessment: Faces Faces Pain Scale: Hurts a little bit Pain Location: R hip/thigh Pain Descriptors / Indicators: Tender Pain Intervention(s): Limited activity within patient's tolerance;Monitored during session;Repositioned     Hand Dominance Right   Extremity/Trunk Assessment Upper Extremity Assessment Upper Extremity Assessment: Difficult to assess due to impaired cognition   Lower Extremity Assessment Lower Extremity Assessment: Difficult to  assess due to impaired cognition       Communication Communication Communication: HOH   Cognition Arousal/Alertness: Awake/alert Behavior During Therapy: Flat affect Overall Cognitive Status: History of cognitive impairments - at baseline                                 General Comments: Pt is oriented to name only. Pt follows 1 step commands with increased time for sequencing.              Home Living Family/patient expects to be discharged to:: Skilled nursing facility                                 Additional Comments: Pt lives in [facility]  but may be transitioning to LTC per family member in the room      Prior Functioning/Environment Level of Independence: Needs assistance  Gait / Transfers Assistance Needed: Pt used RW to ambulate short distance with assist ADL's / Homemaking Assistance Needed: Pt needs assistance with self care            OT Problem List: Decreased strength;Decreased activity tolerance;Impaired balance (sitting and/or standing);Decreased knowledge of use of DME or AE;Decreased safety awareness;Decreased coordination;Cardiopulmonary status limiting activity;Decreased cognition      OT Treatment/Interventions: Self-care/ADL training;Therapeutic exercise;Therapeutic activities;Cognitive remediation/compensation;Energy conservation;DME and/or AE instruction;Patient/family education;Balance training;Manual therapy;Modalities    OT Goals(Current goals can be found in the care plan section) Acute Rehab OT Goals Patient Stated Goal: to improve mobility OT Goal Formulation: With patient Time For Goal Achievement: 05/31/21 Potential to Achieve Goals: Fair ADL Goals Pt Will Perform Grooming: with supervision;sitting Pt Will Perform Lower Body Dressing: with min assist;sit to/from stand Pt Will Transfer to Toilet: with min assist;ambulating Pt Will Perform Toileting - Clothing Manipulation and hygiene: with min assist;sit to/from stand  OT Frequency: Min 2X/week   Barriers to D/C:    none known at this time          AM-PAC OT "6 Clicks" Daily Activity     Outcome Measure Help from another person eating meals?: A Little Help from another person taking care of personal grooming?: A Little Help from another person toileting, which includes using toliet, bedpan, or urinal?: Total Help from another person bathing (including washing, rinsing, drying)?: A Lot Help from another person to put on and taking off regular upper body clothing?: A Little Help from another person to put on and taking off regular  lower body clothing?: Total 6 Click Score: 13   End of Session Nurse Communication: Mobility status  Activity Tolerance: Other (comment);Patient limited by fatigue (limited by nausea) Patient left: in bed;with call bell/phone within reach;with bed alarm set  OT Visit Diagnosis: Unsteadiness on feet (R26.81);Muscle weakness (generalized) (M62.81);History of falling (Z91.81)                Time: 3235-5732 OT Time Calculation (min): 22 min Charges:  OT General Charges $OT Visit: 1 Visit OT Evaluation $OT Eval Moderate Complexity: 1 Mod OT Treatments $Therapeutic Activity: 8-22 mins  Darleen Crocker, MS, OTR/L , CBIS ascom [phone]  05/17/21, 3:35 PM

## 2021-05-17 NOTE — Progress Notes (Signed)
PROGRESS NOTE    Marilyn Oconnell  YIF:[id number] DOB: 03-26-1924 DOA: 05/15/2021 PCP: Margo Common, PA-C    Brief Narrative:  Mrs. Crute was admitted to the hospital with the working diagnosis of right hip fracture.    87 year old female past medical history for severe arctic stenosis, chronic diastolic heart failure, COPD, dementia, legally blind, and atrial fibrillation who presented after suffering a mechanical fall.  Patient uses a walker for ambulation, she lives in assisted living facility but looking into long-term care.  Chronic and baseline dyspnea.  On her initial physical examination blood pressure 198/72, heart rate 65, temperature 98.2, respiratory rate 15, oxygen saturation 98%.  Her lungs were clear to auscultation bilaterally, heart S1-S2, present, rhythmic, 3/6 systolic murmur, abdomen soft, no lower extremity edema.   Sodium 130, potassium 4.3, chloride 95, bicarb 25, glucose 155, BUN 43, creatinine 2.0, white count 12.3, hemoglobin 10.7, hematocrit 30.8, platelets 205. SARS COVID-19 negative.   Urinalysis specific gravity 1.006, 0-5 white cells.    Right hip films with acute impacted intertrochanteric fracture of the proximal right femur.    CT head/cervical spine no acute changes.   Chest radiograph with increased lung markings bilaterally.  No infiltrates.   EKG 61 bpm, normal axis, normal intervals, sinus rhythm, no significant ST segment or T wave changes.   Patient has been evaluated by orthopedics, recommendation for open reduction internal fixation.  Patient underwent intramedullary nail intertrochanteric placement with good toleration 05/16/21.   Assessment & Plan:   Principal Problem:   Hip fracture ([place]) Active Problems:   HLD (hyperlipidemia)   Atherosclerosis of coronary artery   Severe aortic stenosis   Bilateral cataracts   Acute kidney injury superimposed on CKD (HCC)   Depression   Blindness   Hypertension   PAF (paroxysmal atrial  fibrillation) (HCC)   Chronic diastolic CHF (congestive heart failure) (HCC)   Hyponatremia    Acute right impacted intertrochanteric fracture of the proximal femur.  Sp nail placement, intramedullary with good toleration, her pain seems to be well controlled.    Continue with IV morphine and oral oxycodone for pain control, plus scheduled acetaminophen. Follow with surgery, PT and OT recommendations.  Add bowel regimen to prevent constipation.    2. Diastolic heart failure and chronic aortic stenosis. HTN  No acute decompensated heart failure. On metoprolol and hydralazine. Continue holding losartan to prevent hypotension.  Furosemide continue to be on hold.    3. Paroxysmal atrial fibrillation.  Continue with metoprolol for rate control.  Patient not on full anticoagulation at home.    4. AKI on Ckd stage IV.  Renal function with serum cr at 2,12 with K at 4,6 and serum bicarbonate at 25 with Cl 99, Poor oral intake, continue gentle hydration with IV fluids at 50 ml per hr Follow up renal function in am, avoid hypotension and nephrotoxic medications.    5. COPD.  No signs of acute exacerbation. On budesonide and duoneb/ dulera.    6. Depression. On escitalopram.   7. Reactive leukocytosis and post operative anemia. Wbc is down to 10 and hgb is 8,6, no signs of active blood loss.  Continue close monitoring of cell count.   Patient continue to be at high risk for worsening renal failure and complications related to hip fracture   Status is: Inpatient  Remains inpatient appropriate because:Inpatient level of care appropriate due to severity of illness  Dispo: The patient is from: ALF  Anticipated d/c is to: SNF              Patient currently is not medically stable to d/c.   Difficult to place patient No   DVT prophylaxis: Enoxaparin   Code Status:    DNR   Family Communication:  I spoke over the phone with the patient's nice about patient's  condition, plan  of care, prognosis and all questions were addressed.     Consultants:  Orthopedic surgery   Procedures:  Right femur intra trochanteric nail placement    Subjective: Patient with no nausea or vomiting, no significant right lower extremity pain, no chest pain or dyspnea. Poor oral intake and deconditioned.   Objective: Vitals:   05/16/21 1813 05/16/21 1941 05/17/21 0456 05/17/21 0814  BP: (!) 143/61 (!) 153/61 (!) 123/57 130/80  Pulse: 73 71 71 88  Resp: 16 20 18 15   Temp: 97.7 F (36.5 C) 97.7 F (36.5 C) 97.9 F (36.6 C) 98.9 F (37.2 C)  TempSrc: Oral Oral Oral Oral  SpO2: 100% 100% 99% 92%  Weight:      Height:        Intake/Output Summary (Last 24 hours) at 05/17/2021 0841 Last data filed at 05/17/2021 0529 Gross per 24 hour  Intake 1658.98 ml  Output 500 ml  Net 1158.98 ml   Filed Weights   05/15/21 1112 05/15/21 1848  Weight: 51.2 kg 52.8 kg    Examination:   General: Not in pain, deconditioned  Neurology: Awake and alert, non focal  E ENT: no pallor, no icterus, oral mucosa moist Cardiovascular: No JVD. S1-S2 present, rhythmic, no gallops, rubs, positive systolic murmur at the base 3/6. No lower extremity edema. Pulmonary: positive breath sounds bilaterally, with no wheezing, rhonchi or rales. Gastrointestinal. Abdomen soft and non tender Skin. No rashes Musculoskeletal: no joint deformities     Data Reviewed: I have personally reviewed following labs and imaging studies  CBC: Recent Labs  Lab 05/15/21 1501 05/16/21 1848 05/17/21 0516  WBC 12.3* 18.0* 10.7*  NEUTROABS 11.3*  --   --   HGB 10.7* 10.4* 8.6*  HCT 30.8* 31.5* 25.9*  MCV 91.9 91.8 92.5  PLT 205 209 947   Basic Metabolic Panel: Recent Labs  Lab 05/15/21 1501 05/16/21 1848 05/17/21 0516  NA 130*  --  136  K 4.3  --  4.6  CL 95*  --  99  CO2 25  --  25  GLUCOSE 155*  --  123*  BUN 43*  --  41*  CREATININE 2.01* 1.83* 2.12*  CALCIUM 9.2  --  8.4*   GFR: Estimated  Creatinine Clearance: 12.6 mL/min (A) (by C-G formula based on SCr of 2.12 mg/dL (H)). Liver Function Tests: No results for input(s): AST, ALT, ALKPHOS, BILITOT, PROT, ALBUMIN in the last 168 hours. No results for input(s): LIPASE, AMYLASE in the last 168 hours. No results for input(s): AMMONIA in the last 168 hours. Coagulation Profile: No results for input(s): INR, PROTIME in the last 168 hours. Cardiac Enzymes: Recent Labs  Lab 05/15/21 1501  CKTOTAL 73   BNP (last 3 results) No results for input(s): PROBNP in the last 8760 hours. HbA1C: No results for input(s): HGBA1C in the last 72 hours. CBG: No results for input(s): GLUCAP in the last 168 hours. Lipid Profile: No results for input(s): CHOL, HDL, LDLCALC, TRIG, CHOLHDL, LDLDIRECT in the last 72 hours. Thyroid Function Tests: No results for input(s): TSH, T4TOTAL, FREET4, T3FREE, THYROIDAB in the last 72  hours. Anemia Panel: No results for input(s): VITAMINB12, FOLATE, FERRITIN, TIBC, IRON, RETICCTPCT in the last 72 hours.    Radiology Studies: I have reviewed all of the imaging during this hospital visit personally     Scheduled Meds:  acetaminophen  650 mg Oral QID   budesonide  0.5 mg Nebulization QHS   docusate sodium  100 mg Oral BID   enoxaparin (LOVENOX) injection  30 mg Subcutaneous Q24H   escitalopram  20 mg Oral QPM   hydrALAZINE  10 mg Oral BID   loratadine  10 mg Oral Daily   metoprolol succinate  25 mg Oral Daily   mometasone-formoterol  2 puff Inhalation BID   mupirocin ointment  1 application Nasal BID   Continuous Infusions:  sodium chloride 75 mL/hr at 05/16/21 1907    ceFAZolin (ANCEF) IV Stopped (05/16/21 2202)   dextrose 5 % and 0.9% NaCl Stopped (05/16/21 1909)     LOS: 2 days        Denali Sharma Gerome Apley, MD

## 2021-05-17 NOTE — NC FL2 (Signed)
[place] LEVEL OF CARE SCREENING TOOL     IDENTIFICATION  Patient Name: Marilyn Oconnell Birthdate: 1924-05-25 Sex: female Admission Date (Current Location): 05/15/2021  [facility] and [place] Number:  Engineering geologist and Address:  [facility], [address] [place], [place] [address]      Provider Number: [id number]  Attending Physician Name and Address:  Tawni Millers,*  Relative Name and Phone Number:  Sharyn Lull Niece [phone]    Current Level of Care: Hospital Recommended Level of Care: [place] Prior Approval Number:    Date Approved/Denied:   PASRR Number: [id number]  Discharge Plan: SNF    Current Diagnoses: Patient Active Problem List   Diagnosis Date Noted   Hip fracture ([place]) 05/15/2021   Pre-op evaluation    Closed nondisplaced intertrochanteric fracture of right femur ([place])    Hyponatremia    Fall 06/30/2020   Hypertension    Coronary artery disease    COPD (chronic obstructive pulmonary disease) ([place])    Hypertensive urgency    Elevated troponin    PAF (paroxysmal atrial fibrillation) (HCC)    Chronic diastolic CHF (congestive heart failure) ([place])    Current mild episode of major depressive disorder, unspecified whether recurrent ([place]) 04/25/2020   Advanced age 68/09/2019   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   Blindness 03/23/2020   Atrial fibrillation with RVR ([place]) 03/22/2020   Chronic pain syndrome 03/16/2020   Closed nondisplaced fracture of surgical neck of left humerus 03/16/2020   Chronic left-sided headaches 03/16/2020   Occipital neuralgia of left side 03/16/2020   Syncope 07/20/2019   Recurrent syncope 07/19/2019   Insomnia 03/18/2016   Visual hallucinations 03/18/2016   Vertigo 03/15/2016   Paroxysmal atrial fibrillation with RVR ([place]) 11/27/2015   Depression 11/27/2015   Adjustment disorder with disturbance of emotion 09/28/2015   Severe aortic stenosis  09/28/2015   Bilateral cataracts 09/28/2015   Cardiac murmur 09/28/2015   Acute kidney injury superimposed on CKD ([place]) 09/28/2015   History of gout 09/28/2015   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/2015   OP (osteoporosis) 09/28/2015   Atherosclerosis of coronary artery 09/08/2015   Avitaminosis D 07/11/2015   LBP (low back pain) 07/11/2015   BP (high blood pressure) 07/11/2015   HLD (hyperlipidemia) 04/12/2015   Billowing mitral valve 08/03/2014   Fibrosis of lung ([place]) 05/25/2014   Asthma-chronic obstructive pulmonary disease overlap syndrome ([place]) 05/25/2014   H/O aortic valve replacement 11/09/2004    Orientation RESPIRATION BLADDER Height & Weight     Self, Place  Normal Continent, External catheter Weight: 52.8 kg Height:  5\' 2"  (157.5 cm)  BEHAVIORAL SYMPTOMS/MOOD NEUROLOGICAL BOWEL NUTRITION STATUS      Continent Diet (regular)  AMBULATORY STATUS COMMUNICATION OF NEEDS Skin   Extensive Assist Verbally Normal, Surgical wounds                       Personal Care Assistance Level of Assistance  Bathing, Dressing Bathing Assistance: Limited assistance   Dressing Assistance: Limited assistance     Functional Limitations Info  Hearing, Sight Sight Info: Impaired Hearing Info: Impaired      SPECIAL CARE FACTORS FREQUENCY  PT (By licensed PT), OT (By licensed OT)     PT Frequency: 5 times per week OT Frequency: 5 times per week            Contractures Contractures Info: Not present    Additional Factors Info  Code Status, Allergies Code Status Info:  DNR Allergies Info: Iodine, Shellfish Allergy, Azithromycin, Sulfa Antibiotics           Current Medications (05/17/2021):  This is the current hospital active medication list Current Facility-Administered Medications  Medication Dose Route Frequency Provider Last Rate Last Admin   acetaminophen (TYLENOL) tablet 650 mg  650 mg Oral QID Hessie Knows, MD   650 mg at 05/17/21 1246   alum & mag hydroxide-simeth  (MAALOX/MYLANTA) 200-200-20 MG/5ML suspension 30 mL  30 mL Oral Q4H PRN Hessie Knows, MD       bisacodyl (DULCOLAX) suppository 10 mg  10 mg Rectal Daily PRN Hessie Knows, MD       budesonide (PULMICORT) nebulizer solution 0.5 mg  0.5 mg Nebulization QHS Hessie Knows, MD   0.5 mg at 05/15/21 2031   dextrose 5 %-0.9 % sodium chloride infusion   Intravenous Continuous Arrien, Jimmy Picket, MD 50 mL/hr at 05/17/21 1007 Rate Change at 05/17/21 1007   diclofenac Sodium (VOLTAREN) 1 % topical gel 4 g  4 g Topical QID PRN Hessie Knows, MD       docusate sodium (COLACE) capsule 100 mg  100 mg Oral BID Hessie Knows, MD   100 mg at 05/17/21 0851   enoxaparin (LOVENOX) injection 30 mg  30 mg Subcutaneous Q24H Hessie Knows, MD   30 mg at 05/17/21 0850   escitalopram (LEXAPRO) tablet 20 mg  20 mg Oral QPM Hessie Knows, MD       feeding supplement (ENSURE ENLIVE / ENSURE PLUS) liquid 237 mL  237 mL Oral TID BM Arrien, Jimmy Picket, MD   237 mL at 05/17/21 1246   hydrALAZINE (APRESOLINE) tablet 10 mg  10 mg Oral BID Hessie Knows, MD   10 mg at 05/17/21 0850   ipratropium-albuterol (DUONEB) 0.5-2.5 (3) MG/3ML nebulizer solution 3 mL  3 mL Nebulization Q6H PRN Hessie Knows, MD       loratadine (CLARITIN) tablet 10 mg  10 mg Oral Daily Hessie Knows, MD   10 mg at 05/17/21 4627   magnesium hydroxide (MILK OF MAGNESIA) suspension 30 mL  30 mL Oral Daily PRN Hessie Knows, MD   30 mL at 05/17/21 0350   menthol-cetylpyridinium (CEPACOL) lozenge 3 mg  1 lozenge Oral PRN Hessie Knows, MD       Or   phenol (CHLORASEPTIC) mouth spray 1 spray  1 spray Mouth/Throat PRN Hessie Knows, MD       metoCLOPramide (REGLAN) tablet 5-10 mg  5-10 mg Oral Q8H PRN Hessie Knows, MD       Or   metoCLOPramide (REGLAN) injection 5-10 mg  5-10 mg Intravenous Q8H PRN Hessie Knows, MD       metoprolol succinate (TOPROL-XL) 24 hr tablet 25 mg  25 mg Oral Daily Hessie Knows, MD   25 mg at 05/17/21 0851    mometasone-formoterol (DULERA) 200-5 MCG/ACT inhaler 2 puff  2 puff Inhalation BID Hessie Knows, MD   2 puff at 05/17/21 0850   morphine 2 MG/ML injection 1 mg  1 mg Intravenous Q2H PRN Hessie Knows, MD       mupirocin ointment (BACTROBAN) 2 % 1 application  1 application Nasal BID Hessie Knows, MD   1 application at 09/38/18 0850   ondansetron (ZOFRAN) tablet 4 mg  4 mg Oral Q6H PRN Hessie Knows, MD       Or   ondansetron [facility]) injection 4 mg  4 mg Intravenous Q6H PRN Hessie Knows, MD       oxyCODONE (Oxy  IR/ROXICODONE) immediate release tablet 5 mg  5 mg Oral Q4H PRN Hessie Knows, MD   5 mg at 05/17/21 0859   polyethylene glycol (MIRALAX / GLYCOLAX) packet 17 g  17 g Oral Daily Tawni Millers, MD   17 g at 05/17/21 1246     Discharge Medications: Please see discharge summary for a list of discharge medications.  Relevant Imaging Results:  Relevant Lab Results:   Additional Information SSN:[id number] fully covid vaccinated  Su Hilt, RN

## 2021-05-17 NOTE — Evaluation (Signed)
Physical Therapy Evaluation Patient Details Name: Marilyn Oconnell MRN: [id number] DOB: 1924-01-23 Today's Date: 05/17/2021   History of Present Illness  Pt is a 87 y.o. female presenting to hospital 8/23 from facility s/p mechanical fall (pt found on floor and c/o R hip pain, had hit her head, and had associated vomiting).  Imaging (+) for acute impacted intertrochanteric fx of proximal R femur.  S/p R hip IMN intertrochanteric 8/24.  Of note, pt with superior and inferior pubic rami fx's R side (imaged 04/18/2021).  PMH includes CHF, COPD, CAD, htn, PAF, [name], legally blind, closed nondisplaced fx of surgical neck of L humerus 03/16/20, chronic L sided HA's, syncope, vertigo, LBP, h/o aortic valve replacement, and shoulder surgery 09/1999.  Clinical Impression  Prior to hospital admission, per chart pt was a minimal ambulator with walker; lives at ALF but was looking into LTC.  Pt's O2 sats 85-86% on room air upon PT arrival so therapist placed pt on 2 L and pt's O2 sats 96% or greater rest of therapy session-nurse notified.  Currently pt is mod to max assist with bed mobility; SBA with sitting balance; and mod to assist to stand up to RW (x2 trials).  Pt only able to stand briefly 1st attempt standing.  Deferred further mobility d/t pt incontinent of urine on 2nd attempt standing and appearing shaky (shakiness resolved within a couple minutes of sitting rest).  Pt assisted back to bed and linens changed (d/t urine incontinence) and purewick replaced.  Nurse notified pt needing assist with feeding (therapist unable to get pt to hold onto spoon to feed herself or get pt to initiate feeding on her own but pt would eat if therapist assisted her).  Pt would benefit from skilled PT to address noted impairments and functional limitations (see below for any additional details).  Upon hospital discharge, pt would benefit from SNF.    Follow Up Recommendations SNF    Equipment Recommendations    (TBD at next facility)    Recommendations for Other Services OT consult     Precautions / Restrictions Precautions Precautions: Fall Precaution Comments: Legally blind Restrictions Weight Bearing Restrictions: Yes RLE Weight Bearing: Weight bearing as tolerated      Mobility  Bed Mobility Overal bed mobility: Needs Assistance Bed Mobility: Rolling;Supine to Sit;Sit to Supine Rolling: Mod assist;Max assist (logrolling L and R in bed to change linens)   Supine to sit: Mod assist;Max assist;HOB elevated Sit to supine: Mod assist;Max assist;HOB elevated   General bed mobility comments: assist for trunk and B LE's semi-supine to/from sitting edge of bed    Transfers Overall transfer level: Needs assistance Equipment used: Rolling walker (2 wheeled) Transfers: Sit to/from Stand Sit to Stand: Mod assist         General transfer comment: x2 trials standing up to RW; vc's and tactile cues for UE placement  Ambulation/Gait             General Gait Details: deferred (pt incontinent of urine upon standing 2nd attempt)  Stairs            Wheelchair Mobility    Modified Rankin (Stroke Patients Only)       Balance Overall balance assessment: Needs assistance Sitting-balance support: No upper extremity supported;Feet supported Sitting balance-Leahy Scale: Good Sitting balance - Comments: steady sitting reaching within BOS   Standing balance support: Bilateral upper extremity supported Standing balance-Leahy Scale: Poor Standing balance comment: posterior lean noted in standing requiring assist to maintain  upright                             Pertinent Vitals/Pain Pain Assessment: Faces Faces Pain Scale: Hurts a little bit Pain Location: R hip/thigh Pain Descriptors / Indicators: Tender Pain Intervention(s): Limited activity within patient's tolerance;Monitored during session;Repositioned HR 84-96 bpm during sessions activities.    Home Living  Family/patient expects to be discharged to:: Skilled nursing facility                 Additional Comments: Per chart review pt from ALF (but was looking into LTC)    Prior Function Level of Independence: Needs assistance   Gait / Transfers Assistance Needed: Per chart review pt was a minimal ambulator (with walker)           Hand Dominance        Extremity/Trunk Assessment   Upper Extremity Assessment Upper Extremity Assessment: Difficult to assess due to impaired cognition    Lower Extremity Assessment Lower Extremity Assessment: Difficult to assess due to impaired cognition       Communication   Communication: HOH  Cognition Arousal/Alertness: Awake/alert Behavior During Therapy: Flat affect (occasionally smiling) Overall Cognitive Status: No family/caregiver present to determine baseline cognitive functioning                                 General Comments: Oriented to name and hospital only      General Comments General comments (skin integrity, edema, etc.): no drainage noted from pt's R hip/thigh honeycomb dressings    Exercises Total Joint Exercises Heel Slides: AAROM;Strengthening;Right;10 reps;Supine Hip ABduction/ADduction: AAROM;Strengthening;Right;10 reps;Supine   Assessment/Plan    PT Assessment Patient needs continued PT services  PT Problem List Decreased strength;Decreased activity tolerance;Decreased balance;Decreased mobility;Decreased knowledge of use of DME;Decreased knowledge of precautions;Pain;Decreased skin integrity       PT Treatment Interventions DME instruction;Gait training;Functional mobility training;Therapeutic activities;Therapeutic exercise;Balance training;Patient/family education    PT Goals (Current goals can be found in the Care Plan section)  Acute Rehab PT Goals Patient Stated Goal: to improve mobility PT Goal Formulation: With patient Time For Goal Achievement: 05/31/21 Potential to Achieve  Goals: Fair    Frequency 7X/week   Barriers to discharge Decreased caregiver support      Co-evaluation               AM-PAC PT "6 Clicks" Mobility  Outcome Measure Help needed turning from your back to your side while in a flat bed without using bedrails?: A Lot Help needed moving from lying on your back to sitting on the side of a flat bed without using bedrails?: A Lot Help needed moving to and from a bed to a chair (including a wheelchair)?: A Lot Help needed standing up from a chair using your arms (e.g., wheelchair or bedside chair)?: A Lot Help needed to walk in hospital room?: Total Help needed climbing 3-5 steps with a railing? : Total 6 Click Score: 10    End of Session Equipment Utilized During Treatment: Gait belt;Oxygen Activity Tolerance: Patient tolerated treatment well Patient left: in bed;with call bell/phone within reach;with bed alarm set;with SCD's reapplied;Other (comment) (B heels floating via pillow support) Nurse Communication: Mobility status;Precautions;Other (comment);Weight bearing status (pt needs assist with feeding; pt's O2 sats low--needing supplemental O2) PT Visit Diagnosis: Other abnormalities of gait and mobility (R26.89);Muscle weakness (generalized) (M62.81);History of falling (Z91.81);Pain  Pain - Right/Left: Right Pain - part of body: Hip    Time: 0911-1000 PT Time Calculation (min) (ACUTE ONLY): 49 min   Charges:   PT Evaluation $PT Eval Low Complexity: 1 Low PT Treatments $Therapeutic Exercise: 8-22 mins $Therapeutic Activity: 8-22 mins       Leitha Bleak, PT 05/17/21, 10:24 AM

## 2021-05-17 NOTE — Progress Notes (Signed)
   Subjective: 1 Day Post-Op Procedure(s) (LRB): INTRAMEDULLARY (IM) NAIL INTERTROCHANTRIC-Right Hip (Right) Patient reports no pain. Patient is well, and has had no acute complaints or problems Denies any CP, SOB, ABD pain. We will continue therapy today.  Plan is to go Skilled nursing facility after hospital stay.  Objective: Vital signs in last 24 hours: Temp:  [97.3 F (36.3 C)-98.9 F (37.2 C)] 98.9 F (37.2 C) (08/25 0814) Pulse Rate:  [62-88] 88 (08/25 0814) Resp:  [14-20] 15 (08/25 0814) BP: (123-227)/(57-97) 130/80 (08/25 0814) SpO2:  [92 %-100 %] 92 % (08/25 0814)  Intake/Output from previous day: 08/24 0701 - 08/25 0700 In: 1659 [I.V.:1509; IV Piggyback:150] Out: 500 [Urine:450; Blood:50] Intake/Output this shift: No intake/output data recorded.  Recent Labs    05/15/21 1501 05/16/21 1848 05/17/21 0516  HGB 10.7* 10.4* 8.6*   Recent Labs    05/16/21 1848 05/17/21 0516  WBC 18.0* 10.7*  RBC 3.43* 2.80*  HCT 31.5* 25.9*  PLT 209 171   Recent Labs    05/15/21 1501 05/16/21 1848 05/17/21 0516  NA 130*  --  136  K 4.3  --  4.6  CL 95*  --  99  CO2 25  --  25  BUN 43*  --  41*  CREATININE 2.01* 1.83* 2.12*  GLUCOSE 155*  --  123*  CALCIUM 9.2  --  8.4*   No results for input(s): LABPT, INR in the last 72 hours.  EXAM General - Patient is Alert and Confused Extremity - Neurovascular intact Sensation intact distally Intact pulses distally Dressing - dressing C/D/I and no drainage Motor Function - intact, moving foot and toes well on exam.   Past Medical History:  Diagnosis Date   Arthritis    CHF (congestive heart failure) (HCC)    COPD (chronic obstructive pulmonary disease) (HCC)    Coronary artery disease    Hypercholesteremia    Hypertension     Assessment/Plan:   1 Day Post-Op Procedure(s) (LRB): INTRAMEDULLARY (IM) NAIL INTERTROCHANTRIC-Right Hip (Right) Principal Problem:   Hip fracture (HCC) Active Problems:   HLD  (hyperlipidemia)   Atherosclerosis of coronary artery   Severe aortic stenosis   Bilateral cataracts   Acute kidney injury superimposed on CKD (HCC)   Depression   Blindness   Hypertension   PAF (paroxysmal atrial fibrillation) (HCC)   Chronic diastolic CHF (congestive heart failure) (HCC)   Hyponatremia  Estimated body mass index is 21.29 kg/m as calculated from the following:   Height as of this encounter: 5\' 2"  (1.575 m).   Weight as of this encounter: 52.8 kg. Advance diet Up with therapy VSS Pain well controlled with tylenol Acute post op blood loss anemia - Hgb 8.6. Recheck Hgb in the am. Consider transfusion if Hgb <7.  CM to assist with discharge  Follow up with [facility] in 2 weeks Lovenox 40 mg SQ daily x 14 days at discharge Tylenol PRN pain   DVT Prophylaxis - Lovenox, TED hose, and SCDs Weight-Bearing as tolerated to right leg   T. Rachelle Hora, PA-C [place] 05/17/2021, 8:43 AM

## 2021-05-17 NOTE — TOC Initial Note (Addendum)
Transition of Care [facility]) - Initial/Assessment Note    Patient Details  Name: Marilyn Oconnell MRN: [id number] Date of Birth: 10-20-1923  [facility] CM/SW Contact:    Su Hilt, RN Phone Number: 05/17/2021, 1:30 PM  Clinical Narrative:  Spoke with the patient's niece Sharyn Lull, The patient was living in ALF but was planning to move to [facility] on Friday for Long Term, She fell and broke her Hip, the patient is very [facility] and is sight impaired as well,  The patient will go to [facility] under STR then transition into Long term care, Spoke with [place] at [facility] and confirmed, PASSR obtained, FL2 completed, needs insurance approval, it is pending    , reference number [id number]                   Patient Goals and CMS Choice        Expected Discharge Plan and Services                                                Prior Living Arrangements/Services                       Activities of Daily Living Home Assistive Devices/Equipment: Dentures (specify type), Cane (specify quad or straight), Walker (specify type) ADL Screening (condition at time of admission) Patient's cognitive ability adequate to safely complete daily activities?: No Is the patient deaf or have difficulty hearing?: Yes Does the patient have difficulty seeing, even when wearing glasses/contacts?: Yes Does the patient have difficulty concentrating, remembering, or making decisions?: Yes Patient able to express need for assistance with ADLs?: Yes Does the patient have difficulty dressing or bathing?: Yes Independently performs ADLs?: No Communication: Independent Dressing (OT): Needs assistance Is this a change from baseline?: Pre-admission baseline Grooming: Needs assistance Is this a change from baseline?: Pre-admission baseline Feeding: Needs assistance Is this a change from baseline?: Pre-admission baseline Bathing: Needs assistance Is this a change from baseline?:  Pre-admission baseline Toileting: Needs assistance Is this a change from baseline?: Pre-admission baseline In/Out Bed: Needs assistance Is this a change from baseline?: Pre-admission baseline Walks in Home: Needs assistance Is this a change from baseline?: Pre-admission baseline Does the patient have difficulty walking or climbing stairs?: Yes Weakness of Legs: Both Weakness of Arms/Hands: Both  Permission Sought/Granted                  Emotional Assessment              Admission diagnosis:  Hip fracture ([place]) [S72.009A] Closed nondisplaced intertrochanteric fracture of right femur, initial encounter ([place]) [S72.144A] Fall in home, initial encounter [W19.Merril Abbe, Y92.009] Patient Active Problem List   Diagnosis Date Noted   Hip fracture ([place]) 05/15/2021   Pre-op evaluation    Closed nondisplaced intertrochanteric fracture of right femur [facility])    Hyponatremia    Fall 06/30/2020   Hypertension    Coronary artery disease    COPD (chronic obstructive pulmonary disease) (HCC)    Hypertensive urgency    Elevated troponin    PAF (paroxysmal atrial fibrillation) (HCC)    Chronic diastolic CHF (congestive heart failure) ([place])    Current mild episode of major depressive disorder, unspecified whether recurrent ([place]) 04/25/2020   Advanced age 66/09[age]2019   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   Blindness 03/23/2020  Atrial fibrillation with RVR (HCC) 03/22/2020   Chronic pain syndrome 03/16/2020   Closed nondisplaced fracture of surgical neck of left humerus 03/16/2020   Chronic left-sided headaches 03/16/2020   Occipital neuralgia of left side 03/16/2020   Syncope 07/20/2019   Recurrent syncope 07/19/2019   Insomnia 03/18/2016   Visual hallucinations 03/18/2016   Vertigo 03/15/2016   Paroxysmal atrial fibrillation with RVR ([place]) 11/27/2015   Depression 11/27/2015   Adjustment disorder with disturbance of emotion 09/28/2015   Severe aortic stenosis 09/28/2015   Bilateral  cataracts 09/28/2015   Cardiac murmur 09/28/2015   Acute kidney injury superimposed on CKD ([place]) 09/28/2015   History of gout 09/28/2015   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/2015   OP (osteoporosis) 09/28/2015   Atherosclerosis of coronary artery 09/08/2015   Avitaminosis D 07/11/2015   LBP (low back pain) 07/11/2015   BP (high blood pressure) 07/11/2015   HLD (hyperlipidemia) 04/12/2015   Billowing mitral valve 08/03/2014   Fibrosis of lung ([place]) 05/25/2014   Asthma-chronic obstructive pulmonary disease overlap syndrome ([place]) 05/25/2014   H/O aortic valve replacement 11/09/2004   PCP:  Margo Common, PA-C Pharmacy:   [place], [place] - [address] [place] [address] Phone: [phone] Fax: [phone]     Social Determinants of Health (SDOH) Interventions    Readmission Risk Interventions No flowsheet data found.

## 2021-05-18 DIAGNOSIS — N179 Acute kidney failure, unspecified: Secondary | ICD-10-CM | POA: Diagnosis not present

## 2021-05-18 DIAGNOSIS — E785 Hyperlipidemia, unspecified: Secondary | ICD-10-CM

## 2021-05-18 DIAGNOSIS — H269 Unspecified cataract: Secondary | ICD-10-CM | POA: Diagnosis not present

## 2021-05-18 DIAGNOSIS — E43 Unspecified severe protein-calorie malnutrition: Secondary | ICD-10-CM

## 2021-05-18 DIAGNOSIS — S72001A Fracture of unspecified part of neck of right femur, initial encounter for closed fracture: Secondary | ICD-10-CM | POA: Diagnosis not present

## 2021-05-18 LAB — BASIC METABOLIC PANEL
Anion gap: 7 (ref 5–15)
BUN: 57 mg/dL — ABNORMAL HIGH (ref 8–23)
CO2: 25 mmol/L (ref 22–32)
Calcium: 8.1 mg/dL — ABNORMAL LOW (ref 8.9–10.3)
Chloride: 104 mmol/L (ref 98–111)
Creatinine, Ser: 3.19 mg/dL — ABNORMAL HIGH (ref 0.44–1.00)
GFR, Estimated: 13 mL/min — ABNORMAL LOW (ref >60)
Glucose, Bld: 109 mg/dL — ABNORMAL HIGH (ref 70–99)
Potassium: 4.4 mmol/L (ref 3.5–5.1)
Sodium: 136 mmol/L (ref 135–145)

## 2021-05-18 LAB — TRANSFERRIN: Transferrin: 161 mg/dL — ABNORMAL LOW (ref 192–382)

## 2021-05-18 LAB — PREPARE RBC (CROSSMATCH)

## 2021-05-18 LAB — IRON AND TIBC
Iron: 12 ug/dL — ABNORMAL LOW (ref 28–170)
Saturation Ratios: 5 % — ABNORMAL LOW (ref 10.4–31.8)
TIBC: 225 ug/dL — ABNORMAL LOW (ref 250–450)
UIBC: 213 ug/dL

## 2021-05-18 LAB — SARS CORONAVIRUS 2 (TAT 6-24 HRS): SARS Coronavirus 2: NEGATIVE

## 2021-05-18 LAB — FERRITIN: Ferritin: 45 ng/mL (ref 11–307)

## 2021-05-18 LAB — ABO/RH: ABO/RH(D): A POS

## 2021-05-18 LAB — HEMOGLOBIN AND HEMATOCRIT, BLOOD
HCT: 23 % — ABNORMAL LOW (ref 36.0–46.0)
Hemoglobin: 7.7 g/dL — ABNORMAL LOW (ref 12.0–15.0)

## 2021-05-18 MED ORDER — ENOXAPARIN SODIUM 40 MG/0.4ML IJ SOSY: 40.0000 mg | PREFILLED_SYRINGE | INTRAMUSCULAR | 0 refills | Status: AC

## 2021-05-18 MED ORDER — OXYCODONE HCL 5 MG PO TABS: 5.0000 mg | ORAL_TABLET | ORAL | 0 refills | Status: AC | PRN

## 2021-05-18 MED ORDER — FUROSEMIDE 20 MG PO TABS
20.0000 mg | ORAL_TABLET | Freq: Every day | ORAL | Status: DC
  Administered 2021-05-18 – 2021-05-21 (×4): 20 mg via ORAL
  Filled 2021-05-18 (×5): qty 1

## 2021-05-18 MED ORDER — SODIUM CHLORIDE 0.9% IV SOLUTION: Freq: Once | INTRAVENOUS | Status: DC

## 2021-05-18 NOTE — Plan of Care (Signed)

## 2021-05-18 NOTE — Care Management Important Message (Signed)
Important Message  Patient Details  Name: Marilyn Oconnell MRN: [id number] Date of Birth: May 02, 1924   Medicare Important Message Given:  Yes  I talked with the patient's niece, Oren Section by phone [phone]) and reviewed the Important Message from Medicare with her.  She is in agreement with the discharge plan and I asked if she would would a copy and she declined.  I wished her Aunt well and thanked her for her time.  [name] 05/18/2021, 9:42 AM

## 2021-05-18 NOTE — Progress Notes (Addendum)
Physical Therapy Treatment Patient Details Name: Marilyn Oconnell MRN: [id number] DOB: 1924-07-07 Today's Date: 05/18/2021    History of Present Illness Pt is a 87 y.o. female presenting to hospital 8/23 from facility s/p mechanical fall (pt found on floor and c/o R hip pain, had hit her head, and had associated vomiting).  Imaging (+) for acute impacted intertrochanteric fx of proximal R femur.  S/p R hip IMN intertrochanteric 8/24.  Of note, pt with superior and inferior pubic rami fx's R side (imaged 04/18/2021).  PMH includes CHF, COPD, CAD, htn, PAF, [name], legally blind, closed nondisplaced fx of surgical neck of L humerus 03/16/20, chronic L sided HA's, syncope, vertigo, LBP, h/o aortic valve replacement, and shoulder surgery 09/1999.    PT Comments    Pt was supine in bed, asleep upon arriving. She easily awakes and is awake throughout session. Pt does endorse fatigue and pain with movements. She will be receiving transfusion later this date so author elected not to progress pt OOB. She did fully participate in bed level there ex. Pt is disoriented and pleasantly confused. Due to vision, she required extensive tactile cues. See exercises performed listed below. Overall pt tolerated session well but will required extensive PT going forward to address deficits while maximizing independence with ADLs. Recommend DC to SNF. Acute PT will continue to follow and [progress as able per current POC.     Follow Up Recommendations  SNF     Equipment Recommendations  Other (comment) (defer to next level of care)       Precautions / Restrictions Precautions Precautions: Fall Precaution Comments: Legally blind Restrictions Weight Bearing Restrictions: Yes RLE Weight Bearing: Weight bearing as tolerated    Mobility  Bed Mobility    General bed mobility comments: Author elected not to perform OOB activity. pt has had continued drop in HgB throughout hospitalization. Will require  transfusion later this date. Did tolerated bed level exercises without safety concern.           Cognition Arousal/Alertness: Awake/alert Behavior During Therapy: Flat affect Overall Cognitive Status: History of cognitive impairments - at baseline      General Comments: Pt is oriented to name only. Pt follows 1 step commands with increased time for sequencing. Lots of tactile cues due to vision      Exercises General Exercises - Lower Extremity Ankle Circles/Pumps: AROM;10 reps;Supine Quad Sets: AROM;5 reps Heel Slides: AAROM;10 reps Hip ABduction/ADduction: AAROM;10 reps Straight Leg Raises: AAROM;10 reps        Pertinent Vitals/Pain Pain Assessment: Faces Pain Location: R hip/thigh Pain Intervention(s): Limited activity within patient's tolerance;Monitored during session;Repositioned     PT Goals (current goals can now be found in the care plan section) Acute Rehab PT Goals Patient Stated Goal: none stated Progress towards PT goals: Progressing toward goals    Frequency    7X/week      PT Plan Current plan remains appropriate       AM-PAC PT "6 Clicks" Mobility   Outcome Measure  Help needed turning from your back to your side while in a flat bed without using bedrails?: A Lot Help needed moving from lying on your back to sitting on the side of a flat bed without using bedrails?: A Lot Help needed moving to and from a bed to a chair (including a wheelchair)?: A Lot Help needed standing up from a chair using your arms (e.g., wheelchair or bedside chair)?: A Lot Help needed to walk in hospital  room?: A Lot Help needed climbing 3-5 steps with a railing? : Total 6 Click Score: 11    End of Session Equipment Utilized During Treatment: Other (comment) (rm air this session) Activity Tolerance: Patient limited by fatigue;Patient limited by lethargy Patient left: in bed;with call bell/phone within reach;with bed alarm set;with SCD's reapplied;Other  (comment) Nurse Communication: Mobility status PT Visit Diagnosis: Other abnormalities of gait and mobility (R26.89);Muscle weakness (generalized) (M62.81);History of falling (Z91.81);Pain Pain - Right/Left: Right Pain - part of body: Hip     Time: 8206-0156 PT Time Calculation (min) (ACUTE ONLY): 13 min  Charges:  $Therapeutic Exercise: 8-22 mins                     Julaine Fusi PTA 05/18/21, 11:23 AM

## 2021-05-18 NOTE — Progress Notes (Signed)
PROGRESS NOTE    SHARONNE RICKETTS  VVZ:[id number] DOB: [birth year] DOA: 05/15/2021 PCP: Margo Common, PA-C    Brief Narrative:  Mrs. Apel was admitted to the hospital with the working diagnosis of right hip fracture.    87 year old female past medical history for severe aortic stenosis, chronic diastolic heart failure, COPD, dementia, legally blind, and atrial fibrillation who presented after suffering a mechanical fall.  Patient uses a walker for ambulation, she lives in assisted living facility but looking into long-term care.  Chronic and baseline dyspnea.  On her initial physical examination blood pressure 198/72, heart rate 65, temperature 98.2, respiratory rate 15, oxygen saturation 98%.  Her lungs were clear to auscultation bilaterally, heart S1-S2, present, rhythmic, 3/6 systolic murmur, abdomen soft, no lower extremity edema.   Sodium 130, potassium 4.3, chloride 95, bicarb 25, glucose 155, BUN 43, creatinine 2.0, white count 12.3, hemoglobin 10.7, hematocrit 30.8, platelets 205. SARS COVID-19 negative.   Urinalysis specific gravity 1.006, 0-5 white cells.    Right hip films with acute impacted intertrochanteric fracture of the proximal right femur.    CT head/cervical spine no acute changes.   Chest radiograph with increased lung markings bilaterally.  No infiltrates.   EKG 61 bpm, normal axis, normal intervals, sinus rhythm, no significant ST segment or T wave changes.   Patient was evaluated by orthopedics, recommendation for open reduction internal fixation and underwent intramedullary nail intertrochanteric placement with good toleration 05/16/21.    Worsening anemia with Hgb down to 7,7, plan for PRBC transfusion before discharge.    Assessment & Plan:   Principal Problem:   Hip fracture ([place]) Active Problems:   HLD (hyperlipidemia)   Atherosclerosis of coronary artery   Severe aortic stenosis   Bilateral cataracts   Acute kidney injury superimposed on CKD  (HCC)   Depression   Blindness   Hypertension   PAF (paroxysmal atrial fibrillation) (HCC)   Chronic diastolic CHF (congestive heart failure) (HCC)   Hyponatremia   Protein-calorie malnutrition, severe   Acute right impacted intratrochanteric fracture of the proximal femur.  Patient received analgesics and DVT prophylaxis.  Underwent surgical intervention per orthopedics with good toleration. Patient was evaluated by physical therapy/Occupational Therapy, plan to transfer to skilled nursing facility to continue her care.   2.  Diastolic heart failure, chronic aortic stenosis/hypertension. Patient remained hemodynamically stable, no signs of acute heart failure decompensation.    Patient had hypotensive episode yesterday with blood pressure systolic 97 mmHg.    Resume furosemide.  To avoid hypotension will continue to hold on losartan and hydralazine.    3.  Paroxysmal atrial fibrillation.  Continue rate control metoprolol, patient is not on anticoagulation.  Likely due to fall risk related to her poor physical functional capacity.   4.  Acute kidney injury chronic kidney disease stage IV. Worsening renal function with serum cr at 3.19 with K at 4,4 and serum bicarbonate at 25.  Plan to discontinue IV fluids and resume furosemide 20 mg po daily.  Add one unit PRBC transfusion today and follow up renal function in am.   5. COPD. No clinical sings of exacerbation. Continue with busesonide, dulera and duoneb.   6. Depression/ severe calorie protein malnutrition. Continue with escitalopram.  Continue with nutritional supplements.  7. Reactive leukocytosis and post operative anemia patient with worsening anemia, Hgb down to 7,7 this am, no signs of active bleeding. Plan to check iron panel and transfuse one unit PRBC.  Follow up cell count in am.  Patient continue to be at high risk for  worsening anemia and renal failure   Status is: Inpatient  Remains inpatient appropriate  because:Inpatient level of care appropriate due to severity of illness  Dispo: The patient is from: ALF              Anticipated d/c is to: SNF              Patient currently is not medically stable to d/c.   Difficult to place patient No   DVT prophylaxis: Enoxaparin   Code Status:   DNR   Family Communication:  Unable to reach her nice over the phone this am, I left a voice message      Nutrition Status: Nutrition Problem: Severe Malnutrition Etiology: social / environmental circumstances Signs/Symptoms: severe muscle depletion, severe fat depletion Interventions: Refer to RD note for recommendations     Consultants:  Orthopedics   Procedures:  Right femur intra-trochanteric nail placement    Subjective: Patient with no nausea or vomiting, denies any significant pain. Very weak and deconditioned   Objective: Vitals:   05/17/21 2019 05/17/21 2111 05/18/21 0435 05/18/21 0755  BP: 120/60  (!) 129/57 (!) 121/50  Pulse: 89  85 82  Resp: 19  20   Temp: 99.3 F (37.4 C)  98.2 F (36.8 C) 97.7 F (36.5 C)  TempSrc:   Oral   SpO2: 96% 94% 93% 94%  Weight:      Height:        Intake/Output Summary (Last 24 hours) at 05/18/2021 1001 Last data filed at 05/18/2021 0755 Gross per 24 hour  Intake 257.15 ml  Output 0 ml  Net 257.15 ml   Filed Weights   05/15/21 1112 05/15/21 1848  Weight: 51.2 kg 52.8 kg    Examination:   General: Not in pain or dyspnea  Neurology: Awake and alert, non focal  E ENT: positive pallor, no icterus, oral mucosa moist Cardiovascular: No JVD. S1-S2 present, rhythmic, no gallops, rubs, or murmurs. No lower extremity edema. Pulmonary: positive breath sounds bilaterally, with no wheezing, rhonchi or rales. Gastrointestinal. Abdomen soft and non tender Skin. No rashes Musculoskeletal: no joint deformities     Data Reviewed: I have personally reviewed following labs and imaging studies  CBC: Recent Labs  Lab 05/15/21 1501  05/16/21 1848 05/17/21 0516 05/18/21 0611  WBC 12.3* 18.0* 10.7*  --   NEUTROABS 11.3*  --   --   --   HGB 10.7* 10.4* 8.6* 7.7*  HCT 30.8* 31.5* 25.9* 23.0*  MCV 91.9 91.8 92.5  --   PLT 205 209 171  --    Basic Metabolic Panel: Recent Labs  Lab 05/15/21 1501 05/16/21 1848 05/17/21 0516 05/18/21 0611  NA 130*  --  136 136  K 4.3  --  4.6 4.4  CL 95*  --  99 104  CO2 25  --  25 25  GLUCOSE 155*  --  123* 109*  BUN 43*  --  41* 57*  CREATININE 2.01* 1.83* 2.12* 3.19*  CALCIUM 9.2  --  8.4* 8.1*   GFR: Estimated Creatinine Clearance: 8.3 mL/min (A) (by C-G formula based on SCr of 3.19 mg/dL (H)). Liver Function Tests: No results for input(s): AST, ALT, ALKPHOS, BILITOT, PROT, ALBUMIN in the last 168 hours. No results for input(s): LIPASE, AMYLASE in the last 168 hours. No results for input(s): AMMONIA in the last 168 hours. Coagulation Profile: No results for input(s): INR, PROTIME in the last 168  hours. Cardiac Enzymes: Recent Labs  Lab 05/15/21 1501  CKTOTAL 73   BNP (last 3 results) No results for input(s): PROBNP in the last 8760 hours. HbA1C: No results for input(s): HGBA1C in the last 72 hours. CBG: No results for input(s): GLUCAP in the last 168 hours. Lipid Profile: No results for input(s): CHOL, HDL, LDLCALC, TRIG, CHOLHDL, LDLDIRECT in the last 72 hours. Thyroid Function Tests: No results for input(s): TSH, T4TOTAL, FREET4, T3FREE, THYROIDAB in the last 72 hours. Anemia Panel: No results for input(s): VITAMINB12, FOLATE, FERRITIN, TIBC, IRON, RETICCTPCT in the last 72 hours.    Radiology Studies: I have reviewed all of the imaging during this hospital visit personally     Scheduled Meds:  sodium chloride   Intravenous Once   acetaminophen  650 mg Oral QID   budesonide  0.5 mg Nebulization QHS   docusate sodium  100 mg Oral BID   enoxaparin (LOVENOX) injection  30 mg Subcutaneous Q24H   escitalopram  20 mg Oral QPM   feeding supplement  237  mL Oral TID BM   loratadine  10 mg Oral Daily   metoprolol succinate  25 mg Oral Daily   mometasone-formoterol  2 puff Inhalation BID   mupirocin ointment  1 application Nasal BID   polyethylene glycol  17 g Oral Daily   Continuous Infusions:  dextrose 5 % and 0.9% NaCl 50 mL/hr at 05/18/21 7989     LOS: 3 days        Ames Hoban Gerome Apley, MD

## 2021-05-18 NOTE — Progress Notes (Signed)
   Subjective: 2 Days Post-Op Procedure(s) (LRB): INTRAMEDULLARY (IM) NAIL INTERTROCHANTRIC-Right Hip (Right) Patient reports no pain. Patient is well, and has had no acute complaints or problems Denies any CP, SOB, ABD pain. We will continue therapy today.  Plan is to go Skilled nursing facility after hospital stay.  Objective: Vital signs in last 24 hours: Temp:  [97.6 F (36.4 C)-99.3 F (37.4 C)] 97.7 F (36.5 C) (08/26 0755) Pulse Rate:  [79-89] 82 (08/26 0755) Resp:  [15-20] 20 (08/26 0435) BP: (97-129)/(50-60) 121/50 (08/26 0755) SpO2:  [93 %-100 %] 94 % (08/26 0755)  Intake/Output from previous day: 08/25 0701 - 08/26 0700 In: 257.2 [I.V.:257.2] Out: -  Intake/Output this shift: No intake/output data recorded.  Recent Labs    05/15/21 1501 05/16/21 1848 05/17/21 0516 05/18/21 0611  HGB 10.7* 10.4* 8.6* 7.7*   Recent Labs    05/16/21 1848 05/17/21 0516 05/18/21 0611  WBC 18.0* 10.7*  --   RBC 3.43* 2.80*  --   HCT 31.5* 25.9* 23.0*  PLT 209 171  --    Recent Labs    05/17/21 0516 05/18/21 0611  NA 136 136  K 4.6 4.4  CL 99 104  CO2 25 25  BUN 41* 57*  CREATININE 2.12* 3.19*  GLUCOSE 123* 109*  CALCIUM 8.4* 8.1*   No results for input(s): LABPT, INR in the last 72 hours.  EXAM General - Patient is Alert and Confused Extremity - Neurovascular intact Sensation intact distally Intact pulses distally Dressing - dressing C/D/I and no drainage Motor Function - intact, moving foot and toes well on exam.   Past Medical History:  Diagnosis Date   Arthritis    CHF (congestive heart failure) (HCC)    COPD (chronic obstructive pulmonary disease) (HCC)    Coronary artery disease    Hypercholesteremia    Hypertension     Assessment/Plan:   2 Days Post-Op Procedure(s) (LRB): INTRAMEDULLARY (IM) NAIL INTERTROCHANTRIC-Right Hip (Right) Principal Problem:   Hip fracture (HCC) Active Problems:   HLD (hyperlipidemia)   Atherosclerosis of  coronary artery   Severe aortic stenosis   Bilateral cataracts   Acute kidney injury superimposed on CKD (HCC)   Depression   Blindness   Hypertension   PAF (paroxysmal atrial fibrillation) (HCC)   Chronic diastolic CHF (congestive heart failure) (HCC)   Hyponatremia  Estimated body mass index is 21.29 kg/m as calculated from the following:   Height as of this encounter: 5\' 2"  (1.575 m).   Weight as of this encounter: 52.8 kg. Advance diet Up with therapy VSS Pain well controlled  Acute post op blood loss anemia - Hgb 7.7. Recheck Hgb in the am. Consider transfusion if Hgb <7.  CM to assist with discharge  Follow up with [facility] in 2 weeks Lovenox 40 mg SQ daily x 14 days at discharge    DVT Prophylaxis - Lovenox, TED hose, and SCDs Weight-Bearing as tolerated to right leg   T. Rachelle Hora, PA-C [place] 05/18/2021, 8:16 AM

## 2021-05-19 DIAGNOSIS — F3289 Other specified depressive episodes: Secondary | ICD-10-CM | POA: Diagnosis not present

## 2021-05-19 DIAGNOSIS — N179 Acute kidney failure, unspecified: Secondary | ICD-10-CM | POA: Diagnosis not present

## 2021-05-19 DIAGNOSIS — S72001A Fracture of unspecified part of neck of right femur, initial encounter for closed fracture: Secondary | ICD-10-CM | POA: Diagnosis not present

## 2021-05-19 DIAGNOSIS — I5032 Chronic diastolic (congestive) heart failure: Secondary | ICD-10-CM | POA: Diagnosis not present

## 2021-05-19 LAB — TYPE AND SCREEN
ABO/RH(D): A POS
Antibody Screen: NEGATIVE
Unit division: 0

## 2021-05-19 LAB — CBC
HCT: 27.3 % — ABNORMAL LOW (ref 36.0–46.0)
Hemoglobin: 9.2 g/dL — ABNORMAL LOW (ref 12.0–15.0)
MCH: 31.6 pg (ref 26.0–34.0)
MCHC: 33.7 g/dL (ref 30.0–36.0)
MCV: 93.8 fL (ref 80.0–100.0)
Platelets: 172 10*3/uL (ref 150–400)
RBC: 2.91 MIL/uL — ABNORMAL LOW (ref 3.87–5.11)
RDW: 14.6 % (ref 11.5–15.5)
WBC: 10.7 10*3/uL — ABNORMAL HIGH (ref 4.0–10.5)
nRBC: 0 % (ref 0.0–0.2)

## 2021-05-19 LAB — BASIC METABOLIC PANEL
Anion gap: 8 (ref 5–15)
BUN: 65 mg/dL — ABNORMAL HIGH (ref 8–23)
CO2: 25 mmol/L (ref 22–32)
Calcium: 8.6 mg/dL — ABNORMAL LOW (ref 8.9–10.3)
Chloride: 106 mmol/L (ref 98–111)
Creatinine, Ser: 2.73 mg/dL — ABNORMAL HIGH (ref 0.44–1.00)
GFR, Estimated: 16 mL/min — ABNORMAL LOW (ref >60)
Glucose, Bld: 99 mg/dL (ref 70–99)
Potassium: 4.9 mmol/L (ref 3.5–5.1)
Sodium: 139 mmol/L (ref 135–145)

## 2021-05-19 LAB — BPAM RBC
Blood Product Expiration Date: 202209222359
ISSUE DATE / TIME: 202208261451
Unit Type and Rh: 6200

## 2021-05-19 MED ORDER — LABETALOL HCL 5 MG/ML IV SOLN
20.0000 mg | INTRAVENOUS | Status: DC | PRN
  Administered 2021-05-19 – 2021-05-21 (×5): 20 mg via INTRAVENOUS
  Filled 2021-05-19 (×5): qty 4

## 2021-05-19 MED ORDER — FERROUS SULFATE 325 (65 FE) MG PO TABS
325.0000 mg | ORAL_TABLET | Freq: Two times a day (BID) | ORAL | Status: DC
  Administered 2021-05-19 – 2021-05-21 (×5): 325 mg via ORAL
  Filled 2021-05-19 (×5): qty 1

## 2021-05-19 NOTE — Progress Notes (Signed)
  Subjective: 3 Days Post-Op Procedure(s) (LRB): INTRAMEDULLARY (IM) NAIL INTERTROCHANTRIC-Right Hip (Right) Patient reports pain as well-controlled.   Patient is well, and has had no acute complaints or problems Plan is to go Skilled nursing facility after hospital stay. Negative for chest pain and shortness of breath Fever: no   Objective: Vital signs in last 24 hours: Temp:  [97.6 F (36.4 C)-99.7 F (37.6 C)] 98.4 F (36.9 C) (08/27 0900) Pulse Rate:  [62-81] 62 (08/27 0900) Resp:  [16-18] 17 (08/27 0900) BP: (121-192)/(58-72) 156/70 (08/27 0900) SpO2:  [92 %-98 %] 98 % (08/27 0900)  Intake/Output from previous day:  Intake/Output Summary (Last 24 hours) at 05/19/2021 1035 Last data filed at 05/18/2021 2154 Gross per 24 hour  Intake 358 ml  Output 400 ml  Net -42 ml    Intake/Output this shift: No intake/output data recorded.  Labs: Recent Labs    05/16/21 1848 05/17/21 0516 05/18/21 0611 05/19/21 0557  HGB 10.4* 8.6* 7.7* 9.2*   Recent Labs    05/17/21 0516 05/18/21 0611 05/19/21 0557  WBC 10.7*  --  10.7*  RBC 2.80*  --  2.91*  HCT 25.9* 23.0* 27.3*  PLT 171  --  172   Recent Labs    05/18/21 0611 05/19/21 0557  NA 136 139  K 4.4 4.9  CL 104 106  CO2 25 25  BUN 57* 65*  CREATININE 3.19* 2.73*  GLUCOSE 109* 99  CALCIUM 8.1* 8.6*   No results for input(s): LABPT, INR in the last 72 hours.   EXAM General - Patient is Alert, Appropriate, and Oriented Extremity - Neurovascular intact Dorsiflexion/Plantar flexion intact Compartment soft Dressing/Incision -no significant drainage on any dressing, but middle dressing starting to peel up  Motor Function - intact, moving foot and toes well on exam.    Assessment/Plan: 3 Days Post-Op Procedure(s) (LRB): INTRAMEDULLARY (IM) NAIL INTERTROCHANTRIC-Right Hip (Right) Principal Problem:   Hip fracture (HCC) Active Problems:   HLD (hyperlipidemia)   Atherosclerosis of coronary artery   Severe  aortic stenosis   Bilateral cataracts   Acute kidney injury superimposed on CKD (HCC)   Depression   Blindness   Hypertension   PAF (paroxysmal atrial fibrillation) (HCC)   Chronic diastolic CHF (congestive heart failure) (HCC)   Hyponatremia   Protein-calorie malnutrition, severe  Estimated body mass index is 21.29 kg/m as calculated from the following:   Height as of this encounter: 5\' 2"  (1.575 m).   Weight as of this encounter: 52.8 kg. Advance diet Up with therapy Discharge to SNF pending approval from [facility], d/c possible today   Heels floated. Middle dressing changed   F/u at [facility] in 2 weeks for staple removal Lovenox 40 mg SQ qd x 14 days at d/c     DVT Prophylaxis - Lovenox, Ted hose, and SCDs Weight-Bearing as tolerated to right leg  Cassell Smiles, PA-C [facility] 05/19/2021, 10:35 AM

## 2021-05-19 NOTE — Progress Notes (Signed)
Physical Therapy Treatment Patient Details Name: Marilyn Oconnell MRN: [id number] DOB: 1924/02/27 Today's Date: 05/19/2021    History of Present Illness Pt is a 87 y.o. female presenting to hospital 8/23 from facility s/p mechanical fall (pt found on floor and c/o R hip pain, had hit her head, and had associated vomiting).  Imaging (+) for acute impacted intertrochanteric fx of proximal R femur.  S/p R hip IMN intertrochanteric 8/24.  Of note, pt with superior and inferior pubic rami fx's R side (imaged 04/18/2021).  PMH includes CHF, COPD, CAD, htn, PAF, [name], legally blind, closed nondisplaced fx of surgical neck of L humerus 03/16/20, chronic L sided HA's, syncope, vertigo, LBP, h/o aortic valve replacement, and shoulder surgery 09/1999.    PT Comments    Pt was long sitting in bed with RN and family member in room. She is much more alert today and oriented x 2. Pt agrees to PT session with encouragement.She requires assistance to exit bed, stand, and take steps to recliner. Poor step quality and safety with ambulation. Highly recommend stand pivot back to bed with +2 assist for safety. Pt will benefit from SNF at DC to address deficits while maximizing independence with ADLs.    Follow Up Recommendations  SNF     Equipment Recommendations  Other (comment) (defer to next level of care)       Precautions / Restrictions Precautions Precautions: Fall Precaution Comments: Legally blind Restrictions Weight Bearing Restrictions: Yes RLE Weight Bearing: Weight bearing as tolerated    Mobility  Bed Mobility Overal bed mobility: Needs Assistance Bed Mobility: Supine to Sit     Supine to sit: Mod assist;Max assist     General bed mobility comments: increased time required however was able to progress BLEs to EOB. Needs mod-max to fully achieve EOB short sit.    Transfers Overall transfer level: Needs assistance Equipment used: Rolling walker (2 wheeled) Transfers:  Sit to/from Stand Sit to Stand: Min assist;From elevated surface         General transfer comment: pt was able to stand from slightly elevated bed height with min assist + vcs. she progressed to taking steps to recliner however very poor gait noted.  Ambulation/Gait Ambulation/Gait assistance: Mod assist Gait Distance (Feet): 3 Feet Assistive device: Rolling walker (2 wheeled) Gait Pattern/deviations: Step-to pattern;Antalgic;Staggering left;Staggering right;Shuffle Gait velocity: decreased   General Gait Details: poor step quality from EOB to recliner     Balance Overall balance assessment: Needs assistance Sitting-balance support: No upper extremity supported;Feet supported Sitting balance-Leahy Scale: Fair Sitting balance - Comments: sat EOB x several minutes prior to standing   Standing balance support: Bilateral upper extremity supported Standing balance-Leahy Scale: Poor Standing balance comment: reliant on UE support for maintaining balance in standing      Cognition Arousal/Alertness: Awake/alert Behavior During Therapy: WFL for tasks assessed/performed Overall Cognitive Status: Within Functional Limits for tasks assessed      General Comments: Pt is much more alert today than previous few days. She is O x 2.      Exercises Total Joint Exercises Hip ABduction/ADduction: AAROM;Strengthening;Right;10 reps;Supine General Exercises - Lower Extremity Ankle Circles/Pumps: AROM;10 reps;Supine Quad Sets: AROM;5 reps Heel Slides: AAROM;10 reps Hip ABduction/ADduction: AAROM;10 reps        Pertinent Vitals/Pain Pain Assessment: Faces Faces Pain Scale: Hurts a little bit Pain Location: R hip/thigh Pain Descriptors / Indicators: Tender Pain Intervention(s): Limited activity within patient's tolerance;Monitored during session;Premedicated before session;Repositioned     PT  Goals (current goals can now be found in the care plan section) Acute Rehab PT Goals Patient  Stated Goal: none stated Progress towards PT goals: Progressing toward goals    Frequency    7X/week      PT Plan Current plan remains appropriate       AM-PAC PT "6 Clicks" Mobility   Outcome Measure  Help needed turning from your back to your side while in a flat bed without using bedrails?: A Lot Help needed moving from lying on your back to sitting on the side of a flat bed without using bedrails?: A Lot Help needed moving to and from a bed to a chair (including a wheelchair)?: A Lot Help needed standing up from a chair using your arms (e.g., wheelchair or bedside chair)?: A Lot Help needed to walk in hospital room?: A Lot Help needed climbing 3-5 steps with a railing? : Total 6 Click Score: 11    End of Session Equipment Utilized During Treatment: Gait belt Activity Tolerance: Patient tolerated treatment well;Patient limited by fatigue;Patient limited by pain Patient left: in chair;with call bell/phone within reach;with chair alarm set;with family/visitor present;with nursing/sitter in room;Other (comment) (recommend +2 RN assist to return to bed for safety) Nurse Communication: Mobility status PT Visit Diagnosis: Other abnormalities of gait and mobility (R26.89);Muscle weakness (generalized) (M62.81);History of falling (Z91.81);Pain Pain - Right/Left: Right Pain - part of body: Hip     Time: 1333-1400 PT Time Calculation (min) (ACUTE ONLY): 27 min  Charges:  $Therapeutic Exercise: 8-22 mins $Therapeutic Activity: 8-22 mins                     Julaine Fusi PTA 05/19/21, 3:30 PM

## 2021-05-19 NOTE — Progress Notes (Signed)
   05/19/21 0831  Vitals  Temp 97.6 F (36.4 C)  BP (!) 192/71  MAP (mmHg) 102  BP Location Left Arm  BP Method Automatic  Patient Position (if appropriate) Lying  Pulse Rate 73  Resp 16  MEWS COLOR  MEWS Score Color Green  Oxygen Therapy  SpO2 97 %  O2 Device Room Air  MEWS Score  MEWS Temp 0  MEWS Systolic 0  MEWS Pulse 0  MEWS RR 0  MEWS LOC 0  MEWS Score 0  Prn Labetalol given for elevated BP. See [facility] Fall

## 2021-05-19 NOTE — TOC Progression Note (Addendum)
Transition of Care [facility]) - Progression Note    Patient Details  Name: [name] MRN: [id number] Date of Birth: 10-Apr-1924  [facility]) CM/SW [place], LCSW Phone Number: 05/19/2021, 9:44 AM  Clinical Narrative:   Auth approved for SNF in [facility] portal. Attempted calls x 4 to [facility]. Unable to reach anyone via phone.  Will try again later today.   10:20- Attempted calls to [facility] again. Unable to reach anyone, keeps ringing with no answer.  Called niece with update, she will try to call them as well.  Will continue to try.  10:30- Spoke to Nurse at [facility] and patient's niece Sharyn Lull on 3 way call. Nurse stated their Nursing Supervisor Thurnell Garbe said they may have to hold the admission, she was unsure if they would be able to take patient today. Margina to call CSW back.   10:40- Call from [facility] [phone]) who stated they cannot take patient until Monday. She stated she spoke to their Assistant DON Tammy who confirmed this. She reported family can call her if they have questions.  Left VM for [facility] with update. Also updated MD and RN.        Expected Discharge Plan and Services                                                 Social Determinants of Health (SDOH) Interventions    Readmission Risk Interventions No flowsheet data found.

## 2021-05-19 NOTE — Progress Notes (Signed)
   05/19/21 0900  Vitals  Temp 98.4 F (36.9 C)  Temp Source Oral  BP (!) 156/70  MAP (mmHg) 97  BP Location Left Arm  BP Method Automatic  Patient Position (if appropriate) Lying  Pulse Rate 62  Resp 17  MEWS COLOR  MEWS Score Color Green  Oxygen Therapy  SpO2 98 %  O2 Device Room Air  MEWS Score  MEWS Temp 0  MEWS Systolic 0  MEWS Pulse 0  MEWS RR 0  MEWS LOC 0  MEWS Score 0  30 min recheck vitals.

## 2021-05-19 NOTE — Progress Notes (Signed)
PROGRESS NOTE    Marilyn Oconnell  GGE:[id number] DOB: September 17, 1924 DOA: 05/15/2021 PCP: Margo Common, PA-C    Brief Narrative:  Marilyn Oconnell was admitted to the hospital with the working diagnosis of right hip fracture.    87 year old female past medical history for severe aortic stenosis, chronic diastolic heart failure, COPD, dementia, legally blind, and atrial fibrillation who presented after suffering a mechanical fall.  Patient uses a walker for ambulation, she lives in assisted living facility but looking into long-term care.  Chronic and baseline dyspnea.  On her initial physical examination blood pressure 198/72, heart rate 65, temperature 98.2, respiratory rate 15, oxygen saturation 98%.  Her lungs were clear to auscultation bilaterally, heart S1-S2, present, rhythmic, 3/6 systolic murmur, abdomen soft, no lower extremity edema.   Sodium 130, potassium 4.3, chloride 95, bicarb 25, glucose 155, BUN 43, creatinine 2.0, white count 12.3, hemoglobin 10.7, hematocrit 30.8, platelets 205. SARS COVID-19 negative.   Urinalysis specific gravity 1.006, 0-5 white cells.    Right hip films with acute impacted intertrochanteric fracture of the proximal right femur.    CT head/cervical spine no acute changes.   Chest radiograph with increased lung markings bilaterally.  No infiltrates.   EKG 61 bpm, normal axis, normal intervals, sinus rhythm, no significant ST segment or T wave changes.   Patient was evaluated by orthopedics, recommendation for open reduction internal fixation and underwent intramedullary nail intertrochanteric placement with good toleration 05/16/21.    Received 1 unit of PRBC due to worsening anemia, current hemoglobin at 9.2. Had a bed offer but they will not accept patient over the weekend, should be able to leave on Monday.  Subjective: Patient was seen and examined today.  Denies any pain.  No other complaints.  Assessment & Plan:   Principal Problem:   Hip  fracture ([place]) Active Problems:   HLD (hyperlipidemia)   Atherosclerosis of coronary artery   Severe aortic stenosis   Bilateral cataracts   Acute kidney injury superimposed on CKD (HCC)   Depression   Blindness   Hypertension   PAF (paroxysmal atrial fibrillation) (HCC)   Chronic diastolic CHF (congestive heart failure) (HCC)   Hyponatremia   Protein-calorie malnutrition, severe   Acute right impacted intratrochanteric fracture of the proximal femur.  Patient received analgesics and DVT prophylaxis.  Underwent surgical intervention per orthopedics with good toleration. Patient was evaluated by physical therapy/Occupational Therapy, plan to transfer to skilled nursing facility to continue her care. Patient will be going on Lovenox for [redacted] weeks along with TED hose as DVT prophylaxis.   2.  Diastolic heart failure, chronic aortic stenosis/hypertension. Patient remained hemodynamically stable, no signs of acute heart failure decompensation.   -Resume furosemide.   -Restart  hydralazine -Keep holding losartan due to AKI.   3.  Paroxysmal atrial fibrillation.  Continue rate control metoprolol, patient is not on anticoagulation.  Likely due to fall risk related to her poor physical functional capacity.   4.  Acute kidney injury chronic kidney disease stage IV.  Some improvement in renal function today.  Creatinine of 2.73.  P.o. furosemide was resumed -Continue to monitor -Avoid nephrotoxins  5. COPD. No clinical sings of exacerbation. Continue with busesonide, dulera and duoneb.   6. Depression/ severe calorie protein malnutrition. Continue with escitalopram.  Continue with nutritional supplements.  7. Reactive leukocytosis and post operative anemia .  Patient received 1 unit of PRBC yesterday, hemoglobin at 9.2 today.  Anemia panel with decreased iron saturation along with low  TIBC which is more consistent with anemia of chronic disease/renal disease with some iron deficiency. -Start  her on iron supplement.  Patient continue to be at high risk for  worsening anemia and renal failure   Status is: Inpatient  Remains inpatient appropriate because:Inpatient level of care appropriate due to severity of illness  Dispo: The patient is from: ALF              Anticipated d/c is to: SNF              Patient currently is not medically stable to d/c.   Difficult to place patient No   DVT prophylaxis: Enoxaparin   Code Status:   DNR   Family Communication:     Nutrition Status: Nutrition Problem: Severe Malnutrition Etiology: social / environmental circumstances Signs/Symptoms: severe muscle depletion, severe fat depletion Interventions: Refer to RD note for recommendations    Consultants:  Orthopedics   Procedures:  Right femur intra-trochanteric nail placement   Objective: Vitals:   05/19/21 0612 05/19/21 0831 05/19/21 0900 05/19/21 1145  BP: (!) 163/61 (!) 192/71 (!) 156/70 (!) 173/55  Pulse: 68 73 62 72  Resp: 18 16 17 16   Temp:  97.6 F (36.4 C) 98.4 F (36.9 C) 98.6 F (37 C)  TempSrc:   Oral Oral  SpO2: 96% 97% 98% 99%  Weight:      Height:        Intake/Output Summary (Last 24 hours) at 05/19/2021 1346 Last data filed at 05/19/2021 1200 Gross per 24 hour  Intake 358 ml  Output 900 ml  Net -542 ml    Filed Weights   05/15/21 1112 05/15/21 1848  Weight: 51.2 kg 52.8 kg    Examination:   General.  Frail elderly lady, in no acute distress. Pulmonary.  Lungs clear bilaterally, normal respiratory effort. CV.  Regular rate and rhythm, no JVD, rub or murmur. Abdomen.  Soft, nontender, nondistended, BS positive. CNS.  Alert and oriented .  No focal neurologic deficit. Extremities.  No edema, no cyanosis, pulses intact and symmetrical.  Data Reviewed: I have personally reviewed following labs and imaging studies  CBC: Recent Labs  Lab 05/15/21 1501 05/16/21 1848 05/17/21 0516 05/18/21 0611 05/19/21 0557  WBC 12.3* 18.0* 10.7*  --   10.7*  NEUTROABS 11.3*  --   --   --   --   HGB 10.7* 10.4* 8.6* 7.7* 9.2*  HCT 30.8* 31.5* 25.9* 23.0* 27.3*  MCV 91.9 91.8 92.5  --  93.8  PLT 205 209 171  --  938    Basic Metabolic Panel: Recent Labs  Lab 05/15/21 1501 05/16/21 1848 05/17/21 0516 05/18/21 0611 05/19/21 0557  NA 130*  --  136 136 139  K 4.3  --  4.6 4.4 4.9  CL 95*  --  99 104 106  CO2 25  --  25 25 25   GLUCOSE 155*  --  123* 109* 99  BUN 43*  --  41* 57* 65*  CREATININE 2.01* 1.83* 2.12* 3.19* 2.73*  CALCIUM 9.2  --  8.4* 8.1* 8.6*    GFR: Estimated Creatinine Clearance: 9.7 mL/min (A) (by C-G formula based on SCr of 2.73 mg/dL (H)). Liver Function Tests: No results for input(s): AST, ALT, ALKPHOS, BILITOT, PROT, ALBUMIN in the last 168 hours. No results for input(s): LIPASE, AMYLASE in the last 168 hours. No results for input(s): AMMONIA in the last 168 hours. Coagulation Profile: No results for input(s): INR, PROTIME in the last  168 hours. Cardiac Enzymes: Recent Labs  Lab 05/15/21 1501  CKTOTAL 73    BNP (last 3 results) No results for input(s): PROBNP in the last 8760 hours. HbA1C: No results for input(s): HGBA1C in the last 72 hours. CBG: No results for input(s): GLUCAP in the last 168 hours. Lipid Profile: No results for input(s): CHOL, HDL, LDLCALC, TRIG, CHOLHDL, LDLDIRECT in the last 72 hours. Thyroid Function Tests: No results for input(s): TSH, T4TOTAL, FREET4, T3FREE, THYROIDAB in the last 72 hours. Anemia Panel: Recent Labs    05/18/21 0925  FERRITIN 45  TIBC 225*  IRON 12*    Radiology Studies: I have reviewed all of the imaging during this hospital visit personally   Scheduled Meds:  sodium chloride   Intravenous Once   acetaminophen  650 mg Oral QID   budesonide  0.5 mg Nebulization QHS   docusate sodium  100 mg Oral BID   enoxaparin (LOVENOX) injection  30 mg Subcutaneous Q24H   escitalopram  20 mg Oral QPM   feeding supplement  237 mL Oral TID BM   ferrous  sulfate  325 mg Oral BID WC   furosemide  20 mg Oral Daily   loratadine  10 mg Oral Daily   metoprolol succinate  25 mg Oral Daily   mometasone-formoterol  2 puff Inhalation BID   mupirocin ointment  1 application Nasal BID   polyethylene glycol  17 g Oral Daily   Continuous Infusions:     LOS: 4 days    Lorella Nimrod, MD This record has been created using Systems analyst. Errors have been sought and corrected,but may not always be located. Such creation errors do not reflect on the standard of care.   Please check amnion after 7 PM for night on-call.

## 2021-05-19 NOTE — Plan of Care (Signed)

## 2021-05-20 DIAGNOSIS — I5032 Chronic diastolic (congestive) heart failure: Secondary | ICD-10-CM | POA: Diagnosis not present

## 2021-05-20 DIAGNOSIS — S72001A Fracture of unspecified part of neck of right femur, initial encounter for closed fracture: Secondary | ICD-10-CM | POA: Diagnosis not present

## 2021-05-20 DIAGNOSIS — F3289 Other specified depressive episodes: Secondary | ICD-10-CM | POA: Diagnosis not present

## 2021-05-20 DIAGNOSIS — N179 Acute kidney failure, unspecified: Secondary | ICD-10-CM | POA: Diagnosis not present

## 2021-05-20 MED ORDER — HYDRALAZINE HCL 10 MG PO TABS
10.0000 mg | ORAL_TABLET | Freq: Two times a day (BID) | ORAL | Status: DC
  Administered 2021-05-20 (×2): 10 mg via ORAL
  Filled 2021-05-20 (×3): qty 1

## 2021-05-20 NOTE — Progress Notes (Signed)
  Subjective: 4 Days Post-Op Procedure(s) (LRB): INTRAMEDULLARY (IM) NAIL INTERTROCHANTRIC-Right Hip (Right) Patient reports pain as well-controlled.   Patient is well, and has had no acute complaints or problems Plan is to go Skilled nursing facility after hospital stay. Negative for chest pain and shortness of breath Fever: no Gastrointestinal: negative for nausea and vomiting.  Patient has not had a bowel movement.  Objective: Vital signs in last 24 hours: Temp:  [98 F (36.7 C)-98.7 F (37.1 C)] 98.2 F (36.8 C) (08/28 0731) Pulse Rate:  [69-75] 69 (08/28 0731) Resp:  [16-20] 16 (08/28 0731) BP: (123-185)/(55-80) 172/61 (08/28 0731) SpO2:  [95 %-99 %] 95 % (08/28 0731)  Intake/Output from previous day:  Intake/Output Summary (Last 24 hours) at 05/20/2021 1047 Last data filed at 05/20/2021 0616 Gross per 24 hour  Intake 240 ml  Output 1000 ml  Net -760 ml    Intake/Output this shift: No intake/output data recorded.  Labs: Recent Labs    05/18/21 0611 05/19/21 0557  HGB 7.7* 9.2*   Recent Labs    05/18/21 0611 05/19/21 0557  WBC  --  10.7*  RBC  --  2.91*  HCT 23.0* 27.3*  PLT  --  172   Recent Labs    05/18/21 0611 05/19/21 0557  NA 136 139  K 4.4 4.9  CL 104 106  CO2 25 25  BUN 57* 65*  CREATININE 3.19* 2.73*  GLUCOSE 109* 99  CALCIUM 8.1* 8.6*   No results for input(s): LABPT, INR in the last 72 hours.   EXAM General - Patient is Alert, Appropriate, and Oriented Extremity - Neurovascular intact Dorsiflexion/Plantar flexion intact Compartment soft Dressing/Incision -clean, dry, no drainage Motor Function - intact, moving foot and toes well on exam.    Assessment/Plan: 4 Days Post-Op Procedure(s) (LRB): INTRAMEDULLARY (IM) NAIL INTERTROCHANTRIC-Right Hip (Right) Principal Problem:   Hip fracture (HCC) Active Problems:   HLD (hyperlipidemia)   Atherosclerosis of coronary artery   Severe aortic stenosis   Bilateral cataracts   Acute  kidney injury superimposed on CKD (HCC)   Depression   Blindness   Hypertension   PAF (paroxysmal atrial fibrillation) (HCC)   Chronic diastolic CHF (congestive heart failure) (HCC)   Hyponatremia   Protein-calorie malnutrition, severe  Estimated body mass index is 21.29 kg/m as calculated from the following:   Height as of this encounter: 5\' 2"  (1.575 m).   Weight as of this encounter: 52.8 kg. Advance diet Up with therapy  Plan for d/c tomorrow. F/u at [facility] ortho in 2 weeks  Work on BM.      DVT Prophylaxis - Lovenox, Ted hose, and SCDs Weight-Bearing as tolerated to right leg  Cassell Smiles, PA-C [facility] 05/20/2021, 10:47 AM

## 2021-05-20 NOTE — Progress Notes (Signed)
Physical Therapy Treatment Patient Details Name: Marilyn Oconnell MRN: [id number] DOB: 1924/06/26 Today's Date: 05/20/2021    History of Present Illness Pt is a 87 y.o. female presenting to hospital 8/23 from facility s/p mechanical fall (pt found on floor and c/o R hip pain, had hit her head, and had associated vomiting).  Imaging (+) for acute impacted intertrochanteric fx of proximal R femur.  S/p R hip IMN intertrochanteric 8/24.  Of note, pt with superior and inferior pubic rami fx's R side (imaged 04/18/2021).  PMH includes CHF, COPD, CAD, htn, PAF, [name], legally blind, closed nondisplaced fx of surgical neck of L humerus 03/16/20, chronic L sided HA's, syncope, vertigo, LBP, h/o aortic valve replacement, and shoulder surgery 09/1999.    PT Comments    Pt attempting to eat supine in bed.  Assisted pt OOB to recliner for better positioning for meal.  Tray in reach and set up to allow for successful self feeding.   Follow Up Recommendations  SNF     Equipment Recommendations  Other (comment) (defer to next level of care)    Recommendations for Other Services       Precautions / Restrictions Precautions Precautions: Fall Precaution Comments: Legally blind Restrictions Weight Bearing Restrictions: Yes RLE Weight Bearing: Weight bearing as tolerated    Mobility  Bed Mobility Overal bed mobility: Needs Assistance Bed Mobility: Supine to Sit     Supine to sit: Mod assist;Max assist          Transfers Overall transfer level: Needs assistance Equipment used: Rolling walker (2 wheeled) Transfers: Sit to/from Stand Sit to Stand: Min assist;Mod assist            Ambulation/Gait Ambulation/Gait assistance: Mod assist Gait Distance (Feet): 3 Feet Assistive device: Rolling walker (2 wheeled) Gait Pattern/deviations: Step-to pattern;Antalgic;Staggering left;Staggering right;Shuffle Gait velocity: decreased   General Gait Details: poor step quality  from EOB to recliner   Stairs             Wheelchair Mobility    Modified Rankin (Stroke Patients Only)       Balance Overall balance assessment: Needs assistance Sitting-balance support: No upper extremity supported;Feet supported Sitting balance-Leahy Scale: Fair     Standing balance support: Bilateral upper extremity supported Standing balance-Leahy Scale: Poor Standing balance comment: reliant on UE support for maintaining balance in standing                            Cognition Arousal/Alertness: Awake/alert Behavior During Therapy: WFL for tasks assessed/performed Overall Cognitive Status: Within Functional Limits for tasks assessed                                        Exercises      General Comments        Pertinent Vitals/Pain Pain Assessment: Faces Faces Pain Scale: Hurts little more Pain Location: R hip/thigh Pain Descriptors / Indicators: Tender;Sore;Grimacing Pain Intervention(s): Limited activity within patient's tolerance;Monitored during session;Repositioned    Home Living                      Prior Function            PT Goals (current goals can now be found in the care plan section) Progress towards PT goals: Progressing toward goals    Frequency  7X/week      PT Plan Current plan remains appropriate    Co-evaluation              AM-PAC PT "6 Clicks" Mobility   Outcome Measure  Help needed turning from your back to your side while in a flat bed without using bedrails?: A Lot Help needed moving from lying on your back to sitting on the side of a flat bed without using bedrails?: A Lot Help needed moving to and from a bed to a chair (including a wheelchair)?: A Lot Help needed standing up from a chair using your arms (e.g., wheelchair or bedside chair)?: A Lot Help needed to walk in hospital room?: A Lot Help needed climbing 3-5 steps with a railing? : Total 6 Click Score:  11    End of Session Equipment Utilized During Treatment: Gait belt Activity Tolerance: Patient tolerated treatment well;Patient limited by fatigue;Patient limited by pain Patient left: in chair;with call bell/phone within reach;with chair alarm set;with family/visitor present;with nursing/sitter in room;Other (comment) (recommend +2 RN assist to return to bed for safety) Nurse Communication: Mobility status PT Visit Diagnosis: Other abnormalities of gait and mobility (R26.89);Muscle weakness (generalized) (M62.81);History of falling (Z91.81);Pain Pain - Right/Left: Right Pain - part of body: Hip     Time: 0900-0910 PT Time Calculation (min) (ACUTE ONLY): 10 min  Charges:  $Therapeutic Activity: 8-22 mins                    Chesley Noon, PTA 05/20/21, 9:52 AM , 9:51 AM

## 2021-05-20 NOTE — Progress Notes (Signed)
PROGRESS NOTE    Marilyn Oconnell  LYY:[id number] DOB: [birth year] DOA: 05/15/2021 PCP: Margo Common, PA-C    Brief Narrative:  Marilyn Oconnell was admitted to the hospital with the working diagnosis of right hip fracture.    87 year old female past medical history for severe aortic stenosis, chronic diastolic heart failure, COPD, dementia, legally blind, and atrial fibrillation who presented after suffering a mechanical fall.  Patient uses a walker for ambulation, she lives in assisted living facility but looking into long-term care.  Chronic and baseline dyspnea.  On her initial physical examination blood pressure 198/72, heart rate 65, temperature 98.2, respiratory rate 15, oxygen saturation 98%.  Her lungs were clear to auscultation bilaterally, heart S1-S2, present, rhythmic, 3/6 systolic murmur, abdomen soft, no lower extremity edema.   Sodium 130, potassium 4.3, chloride 95, bicarb 25, glucose 155, BUN 43, creatinine 2.0, white count 12.3, hemoglobin 10.7, hematocrit 30.8, platelets 205. SARS COVID-19 negative.   Urinalysis specific gravity 1.006, 0-5 white cells.    Right hip films with acute impacted intertrochanteric fracture of the proximal right femur.    CT head/cervical spine no acute changes.   Chest radiograph with increased lung markings bilaterally.  No infiltrates.   EKG 61 bpm, normal axis, normal intervals, sinus rhythm, no significant ST segment or T wave changes.   Patient was evaluated by orthopedics, recommendation for open reduction internal fixation and underwent intramedullary nail intertrochanteric placement with good toleration 05/16/21.    Received 1 unit of PRBC due to worsening anemia, current hemoglobin at 9.2. Had a bed offer but they will not accept patient over the weekend, should be able to leave on Monday. COVID test ordered.  Subjective: Patient was seen and examined today.  Sitting in chair and eating breakfast.  Appetite remains poor.  Denies  any complaints.  Assessment & Plan:   Principal Problem:   Hip fracture ([place]) Active Problems:   HLD (hyperlipidemia)   Atherosclerosis of coronary artery   Severe aortic stenosis   Bilateral cataracts   Acute kidney injury superimposed on CKD (HCC)   Depression   Blindness   Hypertension   PAF (paroxysmal atrial fibrillation) (HCC)   Chronic diastolic CHF (congestive heart failure) (HCC)   Hyponatremia   Protein-calorie malnutrition, severe   Acute right impacted intratrochanteric fracture of the proximal femur.  Patient received analgesics and DVT prophylaxis.  Underwent surgical intervention per orthopedics with good toleration. Patient was evaluated by physical therapy/Occupational Therapy, plan to transfer to skilled nursing facility to continue her care. Patient will be going on Lovenox for [redacted] weeks along with TED hose as DVT prophylaxis.   2.  Diastolic heart failure, chronic aortic stenosis/hypertension. Patient remained hemodynamically stable, no signs of acute heart failure decompensation.   -Resume furosemide.   -Restart  hydralazine -Keep holding losartan due to AKI.   3.  Paroxysmal atrial fibrillation.  Continue rate control metoprolol, patient is not on anticoagulation.  Likely due to fall risk related to her poor physical functional capacity.   4.  Acute kidney injury chronic kidney disease stage IV.  Some improvement in renal function today.  Creatinine of 2.73.  P.o. furosemide was resumed -Continue to monitor -Avoid nephrotoxins  5. COPD. No clinical sings of exacerbation. Continue with busesonide, dulera and duoneb.   6. Depression/ severe calorie protein malnutrition. Continue with escitalopram.  Continue with nutritional supplements.  7. Reactive leukocytosis and post operative anemia .  Patient received 1 unit of PRBC yesterday, hemoglobin at 9.2 today.  Anemia panel with decreased iron saturation along with low TIBC which is more consistent with anemia  of chronic disease/renal disease with some iron deficiency. -Start her on iron supplement.  Patient continue to be at high risk for  worsening anemia and renal failure .  Patient is high risk for deterioration and death based on her advanced age and other comorbidities.  Status is: Inpatient  Remains inpatient appropriate because:Inpatient level of care appropriate due to severity of illness  Dispo: The patient is from: ALF              Anticipated d/c is to: SNF              Patient currently is not medically stable to d/c.   Difficult to place patient No   DVT prophylaxis: Enoxaparin   Code Status:   DNR   Family Communication:     Nutrition Status: Nutrition Problem: Severe Malnutrition Etiology: social / environmental circumstances Signs/Symptoms: severe muscle depletion, severe fat depletion Interventions: Refer to RD note for recommendations    Consultants:  Orthopedics   Procedures:  Right femur intra-trochanteric nail placement   Objective: Vitals:   05/20/21 0450 05/20/21 0636 05/20/21 0731 05/20/21 1355  BP: (!) 185/66 (!) 167/68 (!) 172/61 123/81  Pulse: 75 73 69 77  Resp: 20  16 19   Temp: 98 F (36.7 C)  98.2 F (36.8 C) 98.7 F (37.1 C)  TempSrc:      SpO2: 96%  95% 99%  Weight:      Height:        Intake/Output Summary (Last 24 hours) at 05/20/2021 1457 Last data filed at 05/20/2021 1428 Gross per 24 hour  Intake 360 ml  Output 500 ml  Net -140 ml    Filed Weights   05/15/21 1112 05/15/21 1848  Weight: 51.2 kg 52.8 kg    Examination:   General.  Frail elderly lady, in no acute distress.  Hard of hearing Pulmonary.  Lungs clear bilaterally, normal respiratory effort. CV.  Regular rate and rhythm, no JVD, rub or murmur. Abdomen.  Soft, nontender, nondistended, BS positive. CNS.  Alert and oriented to self only.  No focal neurologic deficit. Extremities.  No edema, no cyanosis,  Psychiatry.  Judgment and insight appears mildly  impaired.  Data Reviewed: I have personally reviewed following labs and imaging studies  CBC: Recent Labs  Lab 05/15/21 1501 05/16/21 1848 05/17/21 0516 05/18/21 0611 05/19/21 0557  WBC 12.3* 18.0* 10.7*  --  10.7*  NEUTROABS 11.3*  --   --   --   --   HGB 10.7* 10.4* 8.6* 7.7* 9.2*  HCT 30.8* 31.5* 25.9* 23.0* 27.3*  MCV 91.9 91.8 92.5  --  93.8  PLT 205 209 171  --  009    Basic Metabolic Panel: Recent Labs  Lab 05/15/21 1501 05/16/21 1848 05/17/21 0516 05/18/21 0611 05/19/21 0557  NA 130*  --  136 136 139  K 4.3  --  4.6 4.4 4.9  CL 95*  --  99 104 106  CO2 25  --  25 25 25   GLUCOSE 155*  --  123* 109* 99  BUN 43*  --  41* 57* 65*  CREATININE 2.01* 1.83* 2.12* 3.19* 2.73*  CALCIUM 9.2  --  8.4* 8.1* 8.6*    GFR: Estimated Creatinine Clearance: 9.7 mL/min (A) (by C-G formula based on SCr of 2.73 mg/dL (H)). Liver Function Tests: No results for input(s): AST, ALT, ALKPHOS, BILITOT, PROT, ALBUMIN in  the last 168 hours. No results for input(s): LIPASE, AMYLASE in the last 168 hours. No results for input(s): AMMONIA in the last 168 hours. Coagulation Profile: No results for input(s): INR, PROTIME in the last 168 hours. Cardiac Enzymes: Recent Labs  Lab 05/15/21 1501  CKTOTAL 73    BNP (last 3 results) No results for input(s): PROBNP in the last 8760 hours. HbA1C: No results for input(s): HGBA1C in the last 72 hours. CBG: No results for input(s): GLUCAP in the last 168 hours. Lipid Profile: No results for input(s): CHOL, HDL, LDLCALC, TRIG, CHOLHDL, LDLDIRECT in the last 72 hours. Thyroid Function Tests: No results for input(s): TSH, T4TOTAL, FREET4, T3FREE, THYROIDAB in the last 72 hours. Anemia Panel: Recent Labs    05/18/21 0925  FERRITIN 45  TIBC 225*  IRON 12*     Radiology Studies: I have reviewed all of the imaging during this hospital visit personally   Scheduled Meds:  sodium chloride   Intravenous Once   acetaminophen  650 mg Oral  QID   budesonide  0.5 mg Nebulization QHS   docusate sodium  100 mg Oral BID   enoxaparin (LOVENOX) injection  30 mg Subcutaneous Q24H   escitalopram  20 mg Oral QPM   feeding supplement  237 mL Oral TID BM   ferrous sulfate  325 mg Oral BID WC   furosemide  20 mg Oral Daily   hydrALAZINE  10 mg Oral BID   loratadine  10 mg Oral Daily   metoprolol succinate  25 mg Oral Daily   mometasone-formoterol  2 puff Inhalation BID   polyethylene glycol  17 g Oral Daily   Continuous Infusions:     LOS: 5 days    Lorella Nimrod, MD This record has been created using Systems analyst. Errors have been sought and corrected,but may not always be located. Such creation errors do not reflect on the standard of care.   Please check amnion after 7 PM for night on-call.

## 2021-05-21 DIAGNOSIS — H543 Unqualified visual loss, both eyes: Secondary | ICD-10-CM | POA: Diagnosis not present

## 2021-05-21 DIAGNOSIS — S72144A Nondisplaced intertrochanteric fracture of right femur, initial encounter for closed fracture: Secondary | ICD-10-CM | POA: Diagnosis not present

## 2021-05-21 DIAGNOSIS — I5032 Chronic diastolic (congestive) heart failure: Secondary | ICD-10-CM | POA: Diagnosis not present

## 2021-05-21 DIAGNOSIS — S72001A Fracture of unspecified part of neck of right femur, initial encounter for closed fracture: Secondary | ICD-10-CM | POA: Diagnosis not present

## 2021-05-21 LAB — SARS CORONAVIRUS 2 (TAT 6-24 HRS): SARS Coronavirus 2: NEGATIVE

## 2021-05-21 LAB — BASIC METABOLIC PANEL
Anion gap: 5 (ref 5–15)
BUN: 65 mg/dL — ABNORMAL HIGH (ref 8–23)
CO2: 26 mmol/L (ref 22–32)
Calcium: 8.9 mg/dL (ref 8.9–10.3)
Chloride: 108 mmol/L (ref 98–111)
Creatinine, Ser: 1.96 mg/dL — ABNORMAL HIGH (ref 0.44–1.00)
GFR, Estimated: 23 mL/min — ABNORMAL LOW (ref >60)
Glucose, Bld: 109 mg/dL — ABNORMAL HIGH (ref 70–99)
Potassium: 4.9 mmol/L (ref 3.5–5.1)
Sodium: 139 mmol/L (ref 135–145)

## 2021-05-21 MED ORDER — HYDRALAZINE HCL 25 MG PO TABS
25.0000 mg | ORAL_TABLET | Freq: Two times a day (BID) | ORAL | Status: DC
  Administered 2021-05-21: 25 mg via ORAL
  Filled 2021-05-21: qty 1

## 2021-05-21 MED ORDER — HYDRALAZINE HCL 25 MG PO TABS: 25.0000 mg | ORAL_TABLET | Freq: Two times a day (BID) | ORAL | Status: AC

## 2021-05-21 MED ORDER — ALUM & MAG HYDROXIDE-SIMETH 200-200-20 MG/5ML PO SUSP: 30.0000 mL | ORAL | 0 refills | Status: AC | PRN

## 2021-05-21 MED ORDER — ENSURE ENLIVE PO LIQD: 237.0000 mL | Freq: Three times a day (TID) | ORAL | 12 refills | Status: AC

## 2021-05-21 MED ORDER — FERROUS SULFATE 325 (65 FE) MG PO TABS: 325.0000 mg | ORAL_TABLET | Freq: Two times a day (BID) | ORAL | 3 refills | Status: AC

## 2021-05-21 MED ORDER — DOCUSATE SODIUM 100 MG PO CAPS: 100.0000 mg | ORAL_CAPSULE | Freq: Two times a day (BID) | ORAL | 0 refills | Status: AC

## 2021-05-21 MED ORDER — METOPROLOL SUCCINATE ER 25 MG PO TB24: 25.0000 mg | ORAL_TABLET | Freq: Every day | ORAL | Status: AC

## 2021-05-21 MED ORDER — POLYETHYLENE GLYCOL 3350 17 G PO PACK: 17.0000 g | PACK | Freq: Every day | ORAL | 0 refills | Status: AC

## 2021-05-21 NOTE — Progress Notes (Signed)
Pt discharged to [facility] via EMS transport. All instructions provided to [place], LPN at [facility] with questions answered.   BP (!) 148/56 (BP Location: Left Arm)   Pulse 80   Temp 97.6 F (36.4 C)   Resp 15   Ht 5\' 2"  (1.575 m)   Wt 52.8 kg   SpO2 98%   BMI 21.29 kg/m

## 2021-05-21 NOTE — TOC Progression Note (Addendum)
Transition of Care [facility]) - Progression Note    Patient Details  Name: SOMARA FRYMIRE MRN: [id number] Date of Birth: 1924-06-14  Transition of Care [facility]) CM/SW [place], RN Phone Number: 05/21/2021, 3:26 PM  Clinical Narrative:      Damaris Schooner with Audry Pili at [facility], They agree to accept the patient today, Sharyn Lull the patient's niece has agreed that the patient will go to [facility] today, [facility] will follow the patient at [facility] I notified Ricky at [facility], and the physician  She will DC today    Update, The patient going to room B9, EMS called and there are 6 patient's ahead of her, The Niece Sharyn Lull is aware, the bedside nurse to call report  Expected Discharge Plan and Services                                                 Social Determinants of Health (SDOH) Interventions    Readmission Risk Interventions No flowsheet data found.

## 2021-05-21 NOTE — Plan of Care (Signed)
  Problem: Education: Goal: Knowledge of General Education information will improve Description: Including pain rating scale, medication(s)/side effects and non-pharmacologic comfort measures Outcome: Not Progressing  Cognitive barrier Problem: Health Behavior/Discharge Planning: Goal: Ability to manage health-related needs will improve Outcome: Not Progressing Cognitive barrier Problem: Clinical Measurements: Goal: Will remain free from infection Outcome: Progressing Goal: Diagnostic test results will improve Outcome: Not Applicable

## 2021-05-21 NOTE — Plan of Care (Signed)
  Problem: Education: Goal: Knowledge of General Education information will improve Description: Including pain rating scale, medication(s)/side effects and non-pharmacologic comfort measures 05/21/2021 1619 by Jules Schick, RN Outcome: Completed/Met 05/21/2021 1152 by Jules Schick, RN Outcome: Progressing   Problem: Health Behavior/Discharge Planning: Goal: Ability to manage health-related needs will improve 05/21/2021 1619 by Jules Schick, RN Outcome: Completed/Met 05/21/2021 1152 by Jules Schick, RN Outcome: Progressing   Problem: Clinical Measurements: Goal: Ability to maintain clinical measurements within normal limits will improve 05/21/2021 1619 by Jules Schick, RN Outcome: Completed/Met 05/21/2021 1152 by Jules Schick, RN Outcome: Progressing Goal: Will remain free from infection 05/21/2021 1619 by Jules Schick, RN Outcome: Completed/Met 05/21/2021 1152 by Jules Schick, RN Outcome: Progressing Goal: Respiratory complications will improve 05/21/2021 1619 by Jules Schick, RN Outcome: Completed/Met 05/21/2021 1152 by Jules Schick, RN Outcome: Progressing Goal: Cardiovascular complication will be avoided 05/21/2021 1619 by Jules Schick, RN Outcome: Completed/Met 05/21/2021 1152 by Jules Schick, RN Outcome: Progressing   Problem: Activity: Goal: Risk for activity intolerance will decrease 05/21/2021 1619 by Jules Schick, RN Outcome: Completed/Met 05/21/2021 1152 by Jules Schick, RN Outcome: Progressing   Problem: Nutrition: Goal: Adequate nutrition will be maintained 05/21/2021 1619 by Jules Schick, RN Outcome: Completed/Met 05/21/2021 1152 by Jules Schick, RN Outcome: Progressing   Problem: Coping: Goal: Level of anxiety will decrease 05/21/2021 1619 by Jules Schick, RN Outcome: Completed/Met 05/21/2021 1152 by Jules Schick, RN Outcome: Progressing   Problem: Elimination: Goal: Will  not experience complications related to bowel motility 05/21/2021 1619 by Jules Schick, RN Outcome: Completed/Met 05/21/2021 1152 by Jules Schick, RN Outcome: Progressing Goal: Will not experience complications related to urinary retention 05/21/2021 1619 by Jules Schick, RN Outcome: Completed/Met 05/21/2021 1152 by Jules Schick, RN Outcome: Progressing   Problem: Pain Managment: Goal: General experience of comfort will improve 05/21/2021 1619 by Jules Schick, RN Outcome: Completed/Met 05/21/2021 1152 by Jules Schick, RN Outcome: Progressing   Problem: Safety: Goal: Ability to remain free from injury will improve 05/21/2021 1619 by Jules Schick, RN Outcome: Completed/Met 05/21/2021 1152 by Jules Schick, RN Outcome: Progressing   Problem: Skin Integrity: Goal: Risk for impaired skin integrity will decrease 05/21/2021 1619 by Jules Schick, RN Outcome: Completed/Met 05/21/2021 1152 by Jules Schick, RN Outcome: Progressing   Problem: Clinical Measurements: Goal: Postoperative complications will be avoided or minimized 05/21/2021 1619 by Jules Schick, RN Outcome: Completed/Met 05/21/2021 1152 by Jules Schick, RN Outcome: Progressing   Problem: Activity: Goal: Ability to ambulate and perform ADLs will improve 05/21/2021 1619 by Jules Schick, RN Outcome: Completed/Met 05/21/2021 1152 by Jules Schick, RN Outcome: Progressing   Problem: Self-Concept: Goal: Ability to maintain and perform role responsibilities to the fullest extent possible will improve 05/21/2021 1619 by Jules Schick, RN Outcome: Completed/Met 05/21/2021 1152 by Jules Schick, RN Outcome: Progressing   Problem: Pain Management: Goal: Pain level will decrease 05/21/2021 1619 by Jules Schick, RN Outcome: Completed/Met 05/21/2021 1152 by Jules Schick, RN Outcome: Progressing

## 2021-05-21 NOTE — Progress Notes (Signed)
Physical Therapy Treatment Patient Details Name: Marilyn Oconnell MRN: [id number] DOB: Feb 18, 1924 Today's Date: 05/21/2021    History of Present Illness Pt is a 87 y.o. female presenting to hospital 8/23 from facility s/p mechanical fall (pt found on floor and c/o R hip pain, had hit her head, and had associated vomiting).  Imaging (+) for acute impacted intertrochanteric fx of proximal R femur.  S/p R hip IMN intertrochanteric 8/24.  Of note, pt with superior and inferior pubic rami fx's R side (imaged 04/18/2021).  PMH includes CHF, COPD, CAD, htn, PAF, [name], legally blind, closed nondisplaced fx of surgical neck of L humerus 03/16/20, chronic L sided HA's, syncope, vertigo, LBP, h/o aortic valve replacement, and shoulder surgery 09/1999.    PT Comments    Pt in bed stating she needs to have BM.  Assisted to EOB with mod a x 1.  Good effort but still needs significant assist.  Supervision in sitting.  Stood with walker and mod a x 1 but steps seemed more difficult requiring max a and pt was seated back on bed.  Stand pivot for safety to commode with mod/max a x 1. Pt seemed to have increased fear today which may have impacted transfers.  On commode she is able to void and has a small BM.  RN in to assist with care and transfer to recliner.  Remained up in chair for lunch.   Follow Up Recommendations  SNF     Equipment Recommendations       Recommendations for Other Services       Precautions / Restrictions Precautions Precautions: Fall Restrictions Weight Bearing Restrictions: Yes RLE Weight Bearing: Weight bearing as tolerated    Mobility  Bed Mobility Overal bed mobility: Needs Assistance Bed Mobility: Supine to Sit     Supine to sit: Mod assist     General bed mobility comments: good effort but still needs significant assist    Transfers Overall transfer level: Needs assistance Equipment used: Rolling walker (2 wheeled);None Transfers: Sit to/from  [facility] Sit to Stand: Mod assist Stand pivot transfers: Mod assist;Max assist       General transfer comment: poor stepping today with walker, changed to stand pivot to sucessfully get to commode  Ambulation/Gait                 Stairs             Wheelchair Mobility    Modified Rankin (Stroke Patients Only)       Balance Overall balance assessment: Needs assistance Sitting-balance support: No upper extremity supported;Feet supported Sitting balance-Leahy Scale: Fair     Standing balance support: Bilateral upper extremity supported Standing balance-Leahy Scale: Poor Standing balance comment: reliant on UE support for maintaining balance in standing                            Cognition Arousal/Alertness: Awake/alert Behavior During Therapy: WFL for tasks assessed/performed Overall Cognitive Status: Within Functional Limits for tasks assessed                                        Exercises      General Comments        Pertinent Vitals/Pain Pain Assessment: Faces Faces Pain Scale: Hurts little more Pain Location: abdominal, need to have BM Pain Descriptors /  Indicators: Sore Pain Intervention(s): Limited activity within patient's tolerance;Monitored during session;Repositioned    Home Living                      Prior Function            PT Goals (current goals can now be found in the care plan section) Progress towards PT goals: Progressing toward goals    Frequency    7X/week      PT Plan Current plan remains appropriate    Co-evaluation              AM-PAC PT "6 Clicks" Mobility   Outcome Measure  Help needed turning from your back to your side while in a flat bed without using bedrails?: A Lot Help needed moving from lying on your back to sitting on the side of a flat bed without using bedrails?: A Lot Help needed moving to and from a bed to a chair (including a  wheelchair)?: A Lot Help needed standing up from a chair using your arms (e.g., wheelchair or bedside chair)?: A Lot Help needed to walk in hospital room?: Total Help needed climbing 3-5 steps with a railing? : Total 6 Click Score: 10    End of Session Equipment Utilized During Treatment: Gait belt Activity Tolerance: Patient tolerated treatment well;Patient limited by fatigue Patient left: in chair;with call bell/phone within reach;with chair alarm set;Other (comment) Nurse Communication: Mobility status PT Visit Diagnosis: Other abnormalities of gait and mobility (R26.89);Muscle weakness (generalized) (M62.81);History of falling (Z91.81);Pain Pain - Right/Left: Right Pain - part of body: Hip     Time: 3559-7416 PT Time Calculation (min) (ACUTE ONLY): 23 min  Charges:  $Therapeutic Exercise: 8-22 mins $Therapeutic Activity: 8-22 mins                    Chesley Noon, PTA 05/21/21, 12:18 PM , 12:15 PM

## 2021-05-21 NOTE — Plan of Care (Signed)
  Problem: Education: Goal: Knowledge of General Education information will improve Description: Including pain rating scale, medication(s)/side effects and non-pharmacologic comfort measures Outcome: Progressing   Problem: Health Behavior/Discharge Planning: Goal: Ability to manage health-related needs will improve Outcome: Progressing   Problem: Clinical Measurements: Goal: Ability to maintain clinical measurements within normal limits will improve Outcome: Progressing Goal: Will remain free from infection Outcome: Progressing Goal: Respiratory complications will improve Outcome: Progressing Goal: Cardiovascular complication will be avoided Outcome: Progressing   Problem: Activity: Goal: Risk for activity intolerance will decrease Outcome: Progressing   Problem: Nutrition: Goal: Adequate nutrition will be maintained Outcome: Progressing   Problem: Coping: Goal: Level of anxiety will decrease Outcome: Progressing   Problem: Elimination: Goal: Will not experience complications related to bowel motility Outcome: Progressing Goal: Will not experience complications related to urinary retention Outcome: Progressing   Problem: Pain Managment: Goal: General experience of comfort will improve Outcome: Progressing   Problem: Safety: Goal: Ability to remain free from injury will improve Outcome: Progressing   Problem: Skin Integrity: Goal: Risk for impaired skin integrity will decrease Outcome: Progressing   Problem: Education: Goal: Verbalization of understanding the information provided (i.e., activity precautions, restrictions, etc) will improve Outcome: Progressing Goal: Individualized Educational Video(s) Outcome: Progressing   Problem: Activity: Goal: Ability to ambulate and perform ADLs will improve Outcome: Progressing   Problem: Clinical Measurements: Goal: Postoperative complications will be avoided or minimized Outcome: Progressing   Problem:  Self-Concept: Goal: Ability to maintain and perform role responsibilities to the fullest extent possible will improve Outcome: Progressing

## 2021-05-21 NOTE — Care Management Important Message (Signed)
Important Message  Patient Details  Name: Marilyn Oconnell MRN: [id number] Date of Birth: 10-14-1923   Medicare Important Message Given:  Yes     Dannette Barbara 05/21/2021, 12:00 PM

## 2021-05-21 NOTE — Progress Notes (Addendum)
[employer]ufacturing [employer] [facility]) Hospital Liaison Note  Received request from Belia Heman, RN [place] for hospice services at facility after discharge. Chart and patient information reviewed by [facility] physician. Hospice eligibility confirmed.  Met with patient at bedside and spoke with niece, Marilyn Oconnell to initiate education related to hospice philosophy, services and team approach to care. Sharyn Lull verbalized understanding of information provided. Per discussion, the plan for discharge is via EMS to [facility] on [phone].   Please send signed and completed DNR home with patient/family. Please provide prescriptions at discharge as needed to ensure ongoing symptom management.   ACC information and contact numbers given to [facility]. Above information shared with Belia Heman, RN [facility] Manager.   Please do not hesitate to call with any hospice related questions or concerns.   Thank you for the opportunity to participate in this patient's care.   Bobbie "Loren Racer, RN, BSN [facility] Liaison [phone]

## 2021-05-21 NOTE — Progress Notes (Signed)
   Subjective: 5 Days Post-Op Procedure(s) (LRB): INTRAMEDULLARY (IM) NAIL INTERTROCHANTRIC-Right Hip (Right) Patient reports no pain. Patient is well, and has had no acute complaints or problems Denies any CP, SOB, ABD pain. Denies BM but BM documented in chart 05/20/2021 We will continue therapy today.  Plan is to go Skilled nursing facility after hospital stay.  Objective: Vital signs in last 24 hours: Temp:  [98.1 F (36.7 C)-98.7 F (37.1 C)] 98.1 F (36.7 C) (08/29 0435) Pulse Rate:  [73-77] 73 (08/29 0435) Resp:  [16-20] 18 (08/29 0435) BP: (123-178)/(68-116) 166/69 (08/29 0600) SpO2:  [97 %-99 %] 97 % (08/29 0435)  Intake/Output from previous day: 08/28 0701 - 08/29 0700 In: 120 [P.O.:120] Out: -  Intake/Output this shift: No intake/output data recorded.  Recent Labs    05/19/21 0557  HGB 9.2*   Recent Labs    05/19/21 0557  WBC 10.7*  RBC 2.91*  HCT 27.3*  PLT 172   Recent Labs    05/19/21 0557 05/21/21 0453  NA 139 139  K 4.9 4.9  CL 106 108  CO2 25 26  BUN 65* 65*  CREATININE 2.73* 1.96*  GLUCOSE 99 109*  CALCIUM 8.6* 8.9   No results for input(s): LABPT, INR in the last 72 hours.  EXAM General - Patient is Alert and Confused Extremity - Neurovascular intact Sensation intact distally Intact pulses distally Dressing - dressing C/D/I and scant drainage, dry Motor Function - intact, moving foot and toes well on exam.   Past Medical History:  Diagnosis Date   Arthritis    CHF (congestive heart failure) (HCC)    COPD (chronic obstructive pulmonary disease) (HCC)    Coronary artery disease    Hypercholesteremia    Hypertension     Assessment/Plan:   5 Days Post-Op Procedure(s) (LRB): INTRAMEDULLARY (IM) NAIL INTERTROCHANTRIC-Right Hip (Right) Principal Problem:   Hip fracture (HCC) Active Problems:   HLD (hyperlipidemia)   Atherosclerosis of coronary artery   Severe aortic stenosis   Bilateral cataracts   Acute kidney injury  superimposed on CKD (HCC)   Depression   Blindness   Hypertension   PAF (paroxysmal atrial fibrillation) (HCC)   Chronic diastolic CHF (congestive heart failure) (HCC)   Hyponatremia   Protein-calorie malnutrition, severe  Estimated body mass index is 21.29 kg/m as calculated from the following:   Height as of this encounter: 5\' 2"  (1.575 m).   Weight as of this encounter: 52.8 kg. Advance diet Up with therapy VSS Pain well controlled  Acute post op blood loss anemia - Hgb stable CM to assist with discharge to SNF  Follow up with [place] [facility] in 2 weeks Lovenox 40 mg SQ daily x 14 days at discharge    DVT Prophylaxis - Lovenox, TED hose, and SCDs Weight-Bearing as tolerated to right leg   T. Rachelle Hora, PA-C [place] 05/21/2021, 8:18 AM

## 2021-05-21 NOTE — Discharge Summary (Signed)
[facility] Discharge Summary  TALINE NASS TKW:[id number] DOB: 1924/06/15 DOA: 05/15/2021  PCP: Margo Common, PA-C  Admit date: 05/15/2021 Discharge date: 05/21/2021  Admitted From: ALF Disposition: SNF  Recommendations for Outpatient Follow-up:  Follow up with PCP in 1-2 weeks Follow-up with orthopedic in 1 to 2 weeks Please obtain BMP/CBC in one week Please follow up on the following pending results: None  Home Health: No Equipment/Devices: Rolling walker Discharge Condition: Stable CODE STATUS: DNR Diet recommendation: Heart Healthy   Brief/Interim Summary: 87 year old female past medical history for severe aortic stenosis, chronic diastolic heart failure, COPD, dementia, legally blind, and atrial fibrillation who presented after suffering a mechanical fall.  Patient uses a walker for ambulation, she lives in assisted living facility but looking into long-term care.  Found to have acute impacted intertrochanteric fracture of the proximal right femur.    CT head/cervical spine no acute changes.   Chest radiograph with increased lung markings bilaterally.  No infiltrates.   Orthopedic was consulted and she was taken to the OR for ORIF on 05/16/2021.  Tolerated the procedure well.  Developed some postoperative anemia and received 1 unit of PRBC.  Hemoglobin seems stable.  She was also started on iron supplement.  She is also being discharged on 14 days of Lovenox along with TED hose for DVT prophylaxis. She is also being given pain medications to be used as needed.  She will need schedule bowel regimen to avoid constipation.  Patient has an history of chronic diastolic heart failure, chronic aortic stenosis and hypertension.  Heart failure seems well compensated.  She continue home dose of furosemide.  We increased the dose of hydralazine because of hypertension.  We held home dose of losartan due to AKI which is improving now.  Home dose of losartan can be restarted on  discharge.  Patient also has an history of paroxysmal atrial fibrillation.  Currently rate well controlled on metoprolol, dose decreased to 25 mg.  No anticoagulation most likely secondary to her risk of fall.  Patient has an history of CKD stage IV and did develop some AKI.  Creatinine improving and close to baseline on discharge.  Patient has an history of COPD with no exacerbation during current hospitalization.  She can continue her home bronchodilators.  Patient is very frail and malnourished.  Found to have severe protein caloric malnutrition.  Please continue with nutritional supplement.  Patient is becoming little more lethargic with poor p.o. intake.  On discussion with POA and they would like To go to long-term facility at [facility] with hospice care.  Patient is very high risk for deterioration and death based on her age, prior comorbidities and recent hip fracture.  She will continue rest of her home medications and follow-up with her providers.  Discharge Diagnoses:  Principal Problem:   Hip fracture ([place]) Active Problems:   HLD (hyperlipidemia)   Atherosclerosis of coronary artery   Severe aortic stenosis   Bilateral cataracts   Acute kidney injury superimposed on CKD (HCC)   Depression   Blindness   Hypertension   PAF (paroxysmal atrial fibrillation) (HCC)   Chronic diastolic CHF (congestive heart failure) (HCC)   Hyponatremia   Protein-calorie malnutrition, severe   Discharge Instructions  Discharge Instructions     Diet - low sodium heart healthy   Complete by: As directed    Increase activity slowly   Complete by: As directed    Leave dressing on - Keep it clean, dry, and intact until clinic  visit   Complete by: As directed       Allergies as of 05/21/2021       Reactions   Iodine Anaphylaxis   Shellfish Allergy Anaphylaxis   Azithromycin Hives   Sulfa Antibiotics Hives        Medication List     TAKE these medications    acetaminophen 500  MG tablet Commonly known as: TYLENOL Take 1,000 mg by mouth every 12 (twelve) hours as needed for mild pain, moderate pain, fever or headache.   albuterol (2.5 MG/3ML) 0.083% nebulizer solution Commonly known as: PROVENTIL Take 2.5 mg by nebulization every 8 (eight) hours as needed for wheezing or shortness of breath.   alum & mag hydroxide-simeth 200-200-20 MG/5ML suspension Commonly known as: MAALOX/MYLANTA Take 30 mLs by mouth every 4 (four) hours as needed for indigestion.   budesonide 0.5 MG/2ML nebulizer solution Commonly known as: PULMICORT Take 0.5 mg by nebulization at bedtime.   diclofenac Sodium 1 % Gel Commonly known as: VOLTAREN Apply 2 g topically every 12 (twelve) hours as needed (left occipital lobe pain).   docusate sodium 100 MG capsule Commonly known as: COLACE Take 1 capsule (100 mg total) by mouth 2 (two) times daily.   enoxaparin 40 MG/0.4ML injection Commonly known as: LOVENOX Inject 0.4 mLs (40 mg total) into the skin daily for 14 days.   escitalopram 20 MG tablet Commonly known as: LEXAPRO Take 1 tablet (20 mg total) by mouth daily. What changed: when to take this   feeding supplement Liqd Take 237 mLs by mouth 3 (three) times daily between meals.   ferrous sulfate 325 (65 FE) MG tablet Take 1 tablet (325 mg total) by mouth 2 (two) times daily with a meal.   fexofenadine 180 MG tablet Commonly known as: ALLEGRA Take 180 mg by mouth daily.   Fluticasone-Salmeterol 250-50 MCG/DOSE Aepb Commonly known as: ADVAIR INHALE ONE PUFF INTO LUNGS EVERY 12 HOURS. RINSE MOUTH AFTER USE   furosemide 20 MG tablet Commonly known as: LASIX Take 1 tablet (20 mg total) by mouth daily.   hydrALAZINE 25 MG tablet Commonly known as: APRESOLINE Take 1 tablet (25 mg total) by mouth 2 (two) times daily. What changed:  medication strength how much to take   ipratropium-albuterol 0.5-2.5 (3) MG/3ML Soln Commonly known as: DUONEB Take 3 mLs by nebulization 3  (three) times daily.   losartan 25 MG tablet Commonly known as: COZAAR Take 0.5 tablets (12.5 mg total) by mouth daily.   metoprolol succinate 25 MG 24 hr tablet Commonly known as: TOPROL-XL Take 1 tablet (25 mg total) by mouth daily. What changed:  medication strength See the new instructions.   oxyCODONE 5 MG immediate release tablet Commonly known as: Oxy IR/ROXICODONE Take 1 tablet (5 mg total) by mouth every 4 (four) hours as needed for moderate pain.   polyethylene glycol 17 g packet Commonly known as: MIRALAX / GLYCOLAX Take 17 g by mouth daily.               Discharge Care Instructions  (From admission, onward)           Start     Ordered   05/21/21 0000  Leave dressing on - Keep it clean, dry, and intact until clinic visit        05/21/21 1536            Contact information for follow-up providers     Duanne Guess, PA-C Follow up in 2 week(s).  Specialties: Orthopedic Surgery, Emergency Medicine Contact information: [place] [address] [phone]              Contact information for after-discharge care     Destination     [facility] .   Service: Skilled Nursing Contact information: [address] [place] [phone]                    Allergies  Allergen Reactions   Iodine Anaphylaxis   Shellfish Allergy Anaphylaxis   Azithromycin Hives   Sulfa Antibiotics Hives    Consultations: Orthopedic surgery  Procedures/Studies: DG Chest 1 View  Result Date: 05/15/2021 CLINICAL DATA:  Right hip fracture. EXAM: CHEST  1 VIEW COMPARISON:  March 22, 2020. FINDINGS: Stable cardiomegaly. No pneumothorax is noted. Status post aortic valve repair. Mild central pulmonary vascular congestion is noted. Minimal bibasilar subsegmental atelectasis is noted. Status post right shoulder arthroplasty. IMPRESSION: Stable cardiomegaly with mild central  pulmonary vascular congestion is noted. Minimal bibasilar subsegmental atelectasis. Electronically Signed   By: Marijo Conception M.D.   On: 05/15/2021 13:28   CT HEAD WO CONTRAST (5MM)  Result Date: 05/15/2021 CLINICAL DATA:  Polytrauma. Critical. Head and cervical spine injury. Fell at facility. EXAM: CT HEAD WITHOUT CONTRAST CT CERVICAL SPINE WITHOUT CONTRAST TECHNIQUE: Multidetector CT imaging of the head and cervical spine was performed following the standard protocol without intravenous contrast. Multiplanar CT image reconstructions of the cervical spine were also generated. COMPARISON:  04/18/2021 FINDINGS: CT HEAD FINDINGS Brain: Age related atrophy. Extensive chronic small-vessel ischemic changes of the white matter. No sign acute infarction, mass lesion, hemorrhage, hydrocephalus or extra-axial collection. Vascular: There is atherosclerotic calcification of the major vessels at the base of the brain. Skull: No skull fracture. Sinuses/Orbits: No fluid in the sinuses. No evidence of orbital injury. Other: None CT CERVICAL SPINE FINDINGS Alignment: Exaggerated cervical lordosis. 2-3 mm degenerative anterolisthesis at C6-7, unchanged from prior. Skull base and vertebrae: No regional fracture. Soft tissues and spinal canal: No evidence of traumatic soft tissue finding. Disc levels: Degenerative disc disease and degenerative facet disease throughout the region. Moderate bony foraminal stenosis, unchanged from previous studies. Upper chest: Negative Other: None IMPRESSION: Head CT: Atrophy and chronic small-vessel ischemic changes. No acute or traumatic finding Cervical spine CT: Chronic degenerative changes, stable over prior is scratch that is stable since prior exams. No acute or traumatic finding. Electronically Signed   By: Nelson Chimes M.D.   On: 05/15/2021 14:07   CT Cervical Spine Wo Contrast  Result Date: 05/15/2021 CLINICAL DATA:  Polytrauma. Critical. Head and cervical spine injury. Fell at  facility. EXAM: CT HEAD WITHOUT CONTRAST CT CERVICAL SPINE WITHOUT CONTRAST TECHNIQUE: Multidetector CT imaging of the head and cervical spine was performed following the standard protocol without intravenous contrast. Multiplanar CT image reconstructions of the cervical spine were also generated. COMPARISON:  04/18/2021 FINDINGS: CT HEAD FINDINGS Brain: Age related atrophy. Extensive chronic small-vessel ischemic changes of the white matter. No sign acute infarction, mass lesion, hemorrhage, hydrocephalus or extra-axial collection. Vascular: There is atherosclerotic calcification of the major vessels at the base of the brain. Skull: No skull fracture. Sinuses/Orbits: No fluid in the sinuses. No evidence of orbital injury. Other: None CT CERVICAL SPINE FINDINGS Alignment: Exaggerated cervical lordosis. 2-3 mm degenerative anterolisthesis at C6-7, unchanged from prior. Skull base and vertebrae: No regional fracture. Soft tissues and spinal canal: No evidence of traumatic soft tissue finding.  Disc levels: Degenerative disc disease and degenerative facet disease throughout the region. Moderate bony foraminal stenosis, unchanged from previous studies. Upper chest: Negative Other: None IMPRESSION: Head CT: Atrophy and chronic small-vessel ischemic changes. No acute or traumatic finding Cervical spine CT: Chronic degenerative changes, stable over prior is scratch that is stable since prior exams. No acute or traumatic finding. Electronically Signed   By: Nelson Chimes M.D.   On: 05/15/2021 14:07   DG HIP OPERATIVE UNILAT W OR W/O PELVIS RIGHT  Result Date: 05/16/2021 CLINICAL DATA:  IM nail. EXAM: OPERATIVE RIGHT HIP (WITH PELVIS IF PERFORMED) TECHNIQUE: Fluoroscopic spot image(s) were submitted for interpretation post-operatively. COMPARISON:  Preoperative imaging yesterday. FINDINGS: Three fluoroscopic spot views of the right hip and femur obtained in the operating room. Intramedullary nail with distal locking and  trans trochanteric screw fixation of proximal femur fracture. Fluoroscopy time 1 minutes. IMPRESSION: Procedural fluoroscopy for right femur IM nail. Electronically Signed   By: Keith Rake M.D.   On: 05/16/2021 16:37   DG Hip Unilat W or Wo Pelvis 2-3 Views Right  Result Date: 05/15/2021 CLINICAL DATA:  Status post fall. EXAM: DG HIP (WITH OR WITHOUT PELVIS) 2-3V RIGHT COMPARISON:  04/18/2021 FINDINGS: There is an acute, impacted intertrochanteric fracture involving the proximal right femur. No dislocation identified. Healing fractures involving the right superior and inferior pubic rami are again identified. IMPRESSION: Acute, impacted intertrochanteric fracture of the proximal right femur. Electronically Signed   By: Kerby Moors M.D.   On: 05/15/2021 12:32    Subjective: Patient was seen and examined today.  She appears lethargic.  Does not want to eat.  Denies any pain.  Discharge Exam: Vitals:   05/21/21 0819 05/21/21 1055  BP: (!) 186/98 (!) 164/75  Pulse: 69   Resp: 14   Temp: (!) 97.5 F (36.4 C)   SpO2: 98%    Vitals:   05/21/21 0435 05/21/21 0600 05/21/21 0819 05/21/21 1055  BP: (!) 178/116 (!) 166/69 (!) 186/98 (!) 164/75  Pulse: 73  69   Resp: 18  14   Temp: 98.1 F (36.7 C)  (!) 97.5 F (36.4 C)   TempSrc: Oral     SpO2: 97%  98%   Weight:      Height:        General: Pt is alert, awake, not in acute distress, appears lethargic Cardiovascular: RRR, S1/S2 +, no rubs, no gallops Respiratory: CTA bilaterally, no wheezing, no rhonchi Abdominal: Soft, NT, ND, bowel sounds + Extremities: no edema, no cyanosis   The results of significant diagnostics from this hospitalization (including imaging, microbiology, ancillary and laboratory) are listed below for reference.    Microbiology: Recent Results (from the past 240 hour(s))  Resp Panel by RT-PCR (Flu A&B, Covid) Nasopharyngeal Swab     Status: None   Collection Time: 05/15/21  2:53 PM   Specimen:  Nasopharyngeal Swab; Nasopharyngeal(NP) swabs in vial transport medium  Result Value Ref Range Status   SARS Coronavirus 2 by RT PCR NEGATIVE NEGATIVE Final    Comment: (NOTE) SARS-CoV-2 target nucleic acids are NOT DETECTED.  The SARS-CoV-2 RNA is generally detectable in upper respiratory specimens during the acute phase of infection. The lowest concentration of SARS-CoV-2 viral copies this assay can detect is 138 copies/mL. A negative result does not preclude SARS-Cov-2 infection and should not be used as the sole basis for treatment or other patient management decisions. A negative result may occur with  improper specimen collection/handling, submission of specimen other  than nasopharyngeal swab, presence of viral mutation(s) within the areas targeted by this assay, and inadequate number of viral copies(<138 copies/mL). A negative result must be combined with clinical observations, patient history, and epidemiological information. The expected result is Negative.  Fact Sheet for Patients:  EntrepreneurPulse.com.au  Fact Sheet for Healthcare Providers:  IncredibleEmployment.be  This test is no t yet approved or cleared by the [place] FDA and  has been authorized for detection and/or diagnosis of SARS-CoV-2 by FDA under an Emergency Use Authorization (EUA). This EUA will remain  in effect (meaning this test can be used) for the duration of the COVID-19 declaration under Section 564(b)(1) of the Act, 21 U.S.C.section 360bbb-3(b)(1), unless the authorization is terminated  or revoked sooner.       Influenza A by PCR NEGATIVE NEGATIVE Final   Influenza B by PCR NEGATIVE NEGATIVE Final    Comment: (NOTE) The Xpert Xpress SARS-CoV-2/FLU/RSV plus assay is intended as an aid in the diagnosis of influenza from Nasopharyngeal swab specimens and should not be used as a sole basis for treatment. Nasal washings and aspirates are unacceptable for  Xpert Xpress SARS-CoV-2/FLU/RSV testing.  Fact Sheet for Patients: EntrepreneurPulse.com.au  Fact Sheet for Healthcare Providers: IncredibleEmployment.be  This test is not yet approved or cleared by the [place] FDA and has been authorized for detection and/or diagnosis of SARS-CoV-2 by FDA under an Emergency Use Authorization (EUA). This EUA will remain in effect (meaning this test can be used) for the duration of the COVID-19 declaration under Section 564(b)(1) of the Act, 21 U.S.C. section 360bbb-3(b)(1), unless the authorization is terminated or revoked.  Performed at [facility], [address] [place], [place] [address]   Surgical PCR screen     Status: Abnormal   Collection Time: 05/15/21  8:45 PM   Specimen: Nasal Mucosa; Nasal Swab  Result Value Ref Range Status   MRSA, PCR NEGATIVE NEGATIVE Final   Staphylococcus aureus POSITIVE (A) NEGATIVE Final    Comment: (NOTE) The Xpert SA Assay (FDA approved for NASAL specimens in patients 24 years of age and older), is one component of a comprehensive surveillance program. It is not intended to diagnose infection nor to guide or monitor treatment. Performed at [facility], [place],  [address]   SARS CORONAVIRUS 2 (TAT 6-24 HRS) Nasopharyngeal Nasopharyngeal Swab     Status: None   Collection Time: 05/18/21  9:40 AM   Specimen: Nasopharyngeal Swab  Result Value Ref Range Status   SARS Coronavirus 2 NEGATIVE NEGATIVE Final    Comment: (NOTE) SARS-CoV-2 target nucleic acids are NOT DETECTED.  The SARS-CoV-2 RNA is generally detectable in upper and lower respiratory specimens during the acute phase of infection. Negative results do not preclude SARS-CoV-2 infection, do not rule out co-infections with other pathogens, and should not be used as the sole basis for treatment or other patient management decisions. Negative results must be  combined with clinical observations, patient history, and epidemiological information. The expected result is Negative.  Fact Sheet for Patients: SugarRoll.be  Fact Sheet for Healthcare Providers: https://www.woods-mathews.com/  This test is not yet approved or cleared by the [place] FDA and  has been authorized for detection and/or diagnosis of SARS-CoV-2 by FDA under an Emergency Use Authorization (EUA). This EUA will remain  in effect (meaning this test can be used) for the duration of the COVID-19 declaration under Se ction 564(b)(1) of the Act, 21 U.S.C. section 360bbb-3(b)(1), unless the authorization is terminated  or revoked sooner.  Performed at [facility], [place] [address], [place], [place] [address]   SARS CORONAVIRUS 2 (TAT 6-24 HRS) Nasopharyngeal Nasopharyngeal Swab     Status: None   Collection Time: 05/20/21  5:52 PM   Specimen: Nasopharyngeal Swab  Result Value Ref Range Status   SARS Coronavirus 2 NEGATIVE NEGATIVE Final    Comment: (NOTE) SARS-CoV-2 target nucleic acids are NOT DETECTED.  The SARS-CoV-2 RNA is generally detectable in upper and lower respiratory specimens during the acute phase of infection. Negative results do not preclude SARS-CoV-2 infection, do not rule out co-infections with other pathogens, and should not be used as the sole basis for treatment or other patient management decisions. Negative results must be combined with clinical observations, patient history, and epidemiological information. The expected result is Negative.  Fact Sheet for Patients: SugarRoll.be  Fact Sheet for Healthcare Providers: https://www.woods-mathews.com/  This test is not yet approved or cleared by the [place] FDA and  has been authorized for detection and/or diagnosis of SARS-CoV-2 by FDA under an Emergency Use Authorization (EUA). This EUA will remain  in  effect (meaning this test can be used) for the duration of the COVID-19 declaration under Se ction 564(b)(1) of the Act, 21 U.S.C. section 360bbb-3(b)(1), unless the authorization is terminated or revoked sooner.  Performed at [facility], [place] [address], [place], [place] [address]      Labs: BNP (last 3 results) Recent Labs    06/30/20 0730  BNP 235.3*   Basic Metabolic Panel: Recent Labs  Lab 05/15/21 1501 05/16/21 1848 05/17/21 0516 05/18/21 0611 05/19/21 0557 05/21/21 0453  NA 130*  --  136 136 139 139  K 4.3  --  4.6 4.4 4.9 4.9  CL 95*  --  99 104 106 108  CO2 25  --  25 25 25 26   GLUCOSE 155*  --  123* 109* 99 109*  BUN 43*  --  41* 57* 65* 65*  CREATININE 2.01* 1.83* 2.12* 3.19* 2.73* 1.96*  CALCIUM 9.2  --  8.4* 8.1* 8.6* 8.9   Liver Function Tests: No results for input(s): AST, ALT, ALKPHOS, BILITOT, PROT, ALBUMIN in the last 168 hours. No results for input(s): LIPASE, AMYLASE in the last 168 hours. No results for input(s): AMMONIA in the last 168 hours. CBC: Recent Labs  Lab 05/15/21 1501 05/16/21 1848 05/17/21 0516 05/18/21 0611 05/19/21 0557  WBC 12.3* 18.0* 10.7*  --  10.7*  NEUTROABS 11.3*  --   --   --   --   HGB 10.7* 10.4* 8.6* 7.7* 9.2*  HCT 30.8* 31.5* 25.9* 23.0* 27.3*  MCV 91.9 91.8 92.5  --  93.8  PLT 205 209 171  --  172   Cardiac Enzymes: Recent Labs  Lab 05/15/21 1501  CKTOTAL 73   BNP: Invalid input(s): POCBNP CBG: No results for input(s): GLUCAP in the last 168 hours. D-Dimer No results for input(s): DDIMER in the last 72 hours. Hgb A1c No results for input(s): HGBA1C in the last 72 hours. Lipid Profile No results for input(s): CHOL, HDL, LDLCALC, TRIG, CHOLHDL, LDLDIRECT in the last 72 hours. Thyroid function studies No results for input(s): TSH, T4TOTAL, T3FREE, THYROIDAB in the last 72 hours.  Invalid input(s): FREET3 Anemia work up No results for input(s): VITAMINB12, FOLATE, FERRITIN, TIBC, IRON,  RETICCTPCT in the last 72 hours.  Urinalysis    Component Value Date/Time   COLORURINE STRAW (A) 05/15/2021 1524   [place] (  A) 05/15/2021 1524   LABSPEC 1.006 05/15/2021 1524   PHURINE 5.0 05/15/2021 1524   GLUCOSEU NEGATIVE 05/15/2021 1524   HGBUR SMALL (A) 05/15/2021 1524   BILIRUBINUR NEGATIVE 05/15/2021 1524   KETONESUR NEGATIVE 05/15/2021 1524   PROTEINUR NEGATIVE 05/15/2021 1524   NITRITE NEGATIVE 05/15/2021 [place] 05/15/2021 1524   Sepsis Labs Invalid input(s): PROCALCITONIN,  WBC,  LACTICIDVEN Microbiology Recent Results (from the past 240 hour(s))  Resp Panel by RT-PCR (Flu A&B, Covid) Nasopharyngeal Swab     Status: None   Collection Time: 05/15/21  2:53 PM   Specimen: Nasopharyngeal Swab; Nasopharyngeal(NP) swabs in vial transport medium  Result Value Ref Range Status   SARS Coronavirus 2 by RT PCR NEGATIVE NEGATIVE Final    Comment: (NOTE) SARS-CoV-2 target nucleic acids are NOT DETECTED.  The SARS-CoV-2 RNA is generally detectable in upper respiratory specimens during the acute phase of infection. The lowest concentration of SARS-CoV-2 viral copies this assay can detect is 138 copies/mL. A negative result does not preclude SARS-Cov-2 infection and should not be used as the sole basis for treatment or other patient management decisions. A negative result may occur with  improper specimen collection/handling, submission of specimen other than nasopharyngeal swab, presence of viral mutation(s) within the areas targeted by this assay, and inadequate number of viral copies(<138 copies/mL). A negative result must be combined with clinical observations, patient history, and epidemiological information. The expected result is Negative.  Fact Sheet for Patients:  EntrepreneurPulse.com.au  Fact Sheet for Healthcare Providers:  IncredibleEmployment.be  This test is no t yet approved or cleared by the  [place] FDA and  has been authorized for detection and/or diagnosis of SARS-CoV-2 by FDA under an Emergency Use Authorization (EUA). This EUA will remain  in effect (meaning this test can be used) for the duration of the COVID-19 declaration under Section 564(b)(1) of the Act, 21 U.S.C.section 360bbb-3(b)(1), unless the authorization is terminated  or revoked sooner.       Influenza A by PCR NEGATIVE NEGATIVE Final   Influenza B by PCR NEGATIVE NEGATIVE Final    Comment: (NOTE) The Xpert Xpress SARS-CoV-2/FLU/RSV plus assay is intended as an aid in the diagnosis of influenza from Nasopharyngeal swab specimens and should not be used as a sole basis for treatment. Nasal washings and aspirates are unacceptable for Xpert Xpress SARS-CoV-2/FLU/RSV testing.  Fact Sheet for Patients: EntrepreneurPulse.com.au  Fact Sheet for Healthcare Providers: IncredibleEmployment.be  This test is not yet approved or cleared by the [place] FDA and has been authorized for detection and/or diagnosis of SARS-CoV-2 by FDA under an Emergency Use Authorization (EUA). This EUA will remain in effect (meaning this test can be used) for the duration of the COVID-19 declaration under Section 564(b)(1) of the Act, 21 U.S.C. section 360bbb-3(b)(1), unless the authorization is terminated or revoked.  Performed at [facility], [address] [place], [place] [address]   Surgical PCR screen     Status: Abnormal   Collection Time: 05/15/21  8:45 PM   Specimen: Nasal Mucosa; Nasal Swab  Result Value Ref Range Status   MRSA, PCR NEGATIVE NEGATIVE Final   Staphylococcus aureus POSITIVE (A) NEGATIVE Final    Comment: (NOTE) The Xpert SA Assay (FDA approved for NASAL specimens in patients 51 years of age and older), is one component of a comprehensive surveillance program. It is not intended to diagnose infection nor to guide or monitor  treatment. Performed at [facility], [address] [place], [place] [address]   SARS CORONAVIRUS 2 (TAT 6-24 HRS) Nasopharyngeal Nasopharyngeal Swab     Status: None   Collection Time: 05/18/21  9:40 AM   Specimen: Nasopharyngeal Swab  Result Value Ref Range Status   SARS Coronavirus 2 NEGATIVE NEGATIVE Final    Comment: (NOTE) SARS-CoV-2 target nucleic acids are NOT DETECTED.  The SARS-CoV-2 RNA is generally detectable in upper and lower respiratory specimens during the acute phase of infection. Negative results do not preclude SARS-CoV-2 infection, do not rule out co-infections with other pathogens, and should not be used as the sole basis for treatment or other patient management decisions. Negative results must be combined with clinical observations, patient history, and epidemiological information. The expected result is Negative.  Fact Sheet for Patients: SugarRoll.be  Fact Sheet for Healthcare Providers: https://www.woods-mathews.com/  This test is not yet approved or cleared by the [place] FDA and  has been authorized for detection and/or diagnosis of SARS-CoV-2 by FDA under an Emergency Use Authorization (EUA). This EUA will remain  in effect (meaning this test can be used) for the duration of the COVID-19 declaration under Se ction 564(b)(1) of the Act, 21 U.S.C. section 360bbb-3(b)(1), unless the authorization is terminated or revoked sooner.  Performed at [facility], [place] [address] [place], [place] [address]   SARS CORONAVIRUS 2 (TAT 6-24 HRS) Nasopharyngeal Nasopharyngeal Swab     Status: None   Collection Time: 05/20/21  5:52 PM   Specimen: Nasopharyngeal Swab  Result Value Ref Range Status   SARS Coronavirus 2 NEGATIVE NEGATIVE Final    Comment: (NOTE) SARS-CoV-2 target nucleic acids are NOT DETECTED.  The SARS-CoV-2 RNA is generally detectable in upper and lower respiratory specimens  during the acute phase of infection. Negative results do not preclude SARS-CoV-2 infection, do not rule out co-infections with other pathogens, and should not be used as the sole basis for treatment or other patient management decisions. Negative results must be combined with clinical observations, patient history, and epidemiological information. The expected result is Negative.  Fact Sheet for Patients: SugarRoll.be  Fact Sheet for Healthcare Providers: https://www.woods-mathews.com/  This test is not yet approved or cleared by the [place] FDA and  has been authorized for detection and/or diagnosis of SARS-CoV-2 by FDA under an Emergency Use Authorization (EUA). This EUA will remain  in effect (meaning this test can be used) for the duration of the COVID-19 declaration under Se ction 564(b)(1) of the Act, 21 U.S.C. section 360bbb-3(b)(1), unless the authorization is terminated or revoked sooner.  Performed at [facility], [place] [address], [place], [place] [address]     Time coordinating discharge: Over 30 minutes  SIGNED:  Lorella Nimrod, MD  [facility] 05/21/2021, 3:39 PM  If 7PM-7AM, please contact night-coverage [email or web address]  This record has been created using Systems analyst. Errors have been sought and corrected,but may not always be located. Such creation errors do not reflect on the standard of care.

## 2021-05-21 NOTE — TOC Progression Note (Signed)
Transition of Care [facility]) - Progression Note    Patient Details  Name: Marilyn Oconnell MRN: [id number] Date of Birth: October 27, 1923  Transition of Care [facility]) CM/SW Christine, RN Phone Number: 05/21/2021, 11:04 AM  Clinical Narrative:     Hospice referral made to [facility] for inpatient if appropriate, Spoke with Sonia Baller, they will see the patient and update me        Expected Discharge Plan and Services                                                 Social Determinants of Health (SDOH) Interventions    Readmission Risk Interventions No flowsheet data found.

## 2021-05-25 ENCOUNTER — Other Ambulatory Visit: Payer: Self-pay

## 2021-05-25 ENCOUNTER — Non-Acute Institutional Stay: Payer: Medicare Other | Admitting: Primary Care

## 2021-05-25 DIAGNOSIS — G894 Chronic pain syndrome: Secondary | ICD-10-CM

## 2021-05-25 DIAGNOSIS — W19XXXS Unspecified fall, sequela: Secondary | ICD-10-CM

## 2021-05-25 DIAGNOSIS — R441 Visual hallucinations: Secondary | ICD-10-CM

## 2021-05-25 DIAGNOSIS — Z515 Encounter for palliative care: Secondary | ICD-10-CM

## 2021-05-25 DIAGNOSIS — I5032 Chronic diastolic (congestive) heart failure: Secondary | ICD-10-CM

## 2021-05-25 NOTE — Progress Notes (Signed)
[facility] Pall[facility] Care Consult Note Telephone: [phone]  Fax: [phone]   Date of encounter: 05/25/21 3:42 PM PATIENT NAME: Marilyn [place] [id number]   [phone] (home)  DOB: 1923/12/05 MRN: [id number] PRIMARY CARE PROVIDER:    Tania Ade,  [address] [place] [address] [phone]  REFERRING PROVIDER:   Alvester Morin, MD [address] [place] [address] [phone]  RESPONSIBLE PARTY:    Contact Information     Name Relation Home Work Mobile   Dewey Niece [phone]          I met face to face with patient and [facility] at [facility] facility. Palliative Care was asked to follow this patient by consultation request of  Slade-Hartman, Ivette Loyal* to address advance care planning and complex medical decision making. This is the initial visit.                                     ASSESSMENT AND PLAN / RECOMMENDATIONS:   Advance Care Planning/Goals of Care: Goals include to maximize quality of life and symptom management. Patient/health care surrogate gave his/her permission to discuss.Our advance care planning conversation included a discussion about:    The value and importance of advance care planning  Experiences with loved ones who have been seriously ill or have died  Exploration of personal, cultural or spiritual beliefs that might influence medical decisions  Exploration of goals of care in the event of a sudden injury or illness  Identification  of a healthcare agent - [place] Review of an  advance directive document  CODE STATUS: DNR I met with patient in her nursing home room. I later spoke with her power of attorney by phone. Advance care planning includes a DNR order, comfort measures, use of antibiotics, limited use of IVs but no long-term feeding tube. Her power of attorney is her niece. She endorsed leaving the MOST form  at the facility and I will locate this and scan At my next visit. She outlined patients decline over the past year or so with several falls, entry into assisted living and now her long-term care bed was planned at the time she fell and had a hip fracture. She is currently receiving skilled services and then will transition [facility] care at [facility]. I also introduced hospice services as available when indicated.  Symptom Management/Plan:  Pain she appears somewhat painful though she isn't able to quantify. She appears to have moderate pain on the painad scale. She has an order for PRN oxycodone and I've asked staff to assess for pain and administer if she appears painful. She's also taking 1 g of acetaminophen t.i.d.   Intake: Endorses she's eating some. Albumin WNL 3.7 Weight 116 lbs per hospital records.   Anemia: Received blood during her hospital stay. Hgb had dropped to 7.7 but baseline was 10-11. Also recent GFR = 30. Continue to monitor.  Constipation staff  endorses some constipation  with recent suppository and enema for disimpaction. I recommend to  give MiraLAX daily and to begin Senna one tablet b.i.d. PO especially during narcotic administration. She is at risk with other factors such as decreased intake and immobility,  as well as dependence toileting.   Mentation: Niece endorses a syndrome called Marilyn [place] which involves visual hallucinations in the blind. She was appearing somewhat agitated  at my visit but I did not get the sense that she was agitated in general. She did keep asking for her niece who is out of town. She has several caregivers and family members checking with her routinely. I would just  observe for now if this agitation escalates and to treat if needed. Niece endorses some agitation with UTI, so please also observe for s/sx.  Follow up Palliative Care Visit: Palliative care will continue to follow for complex medical decision making, advance care planning,  and clarification of goals. Return 2-4 weeks or prn.  I spent 45 minutes providing this consultation. More than 50% of the time in this consultation was spent in counseling and care coordination.  PPS: 30%  HOSPICE ELIGIBILITY/DIAGNOSIS: TBD  Chief Complaint: pain, immobility  HISTORY OF PRESENT ILLNESS:  Marilyn Oconnell is a 87 y.o. year old female  with recent fall and hip fx, cognitive impairment, debility and increased immobility, pain and constipation.   History obtained from review of EMR, discussion with primary team, and interview with family, facility staff/caregiver and/or Marilyn [place].  I reviewed available labs, medications, imaging, studies and related documents from the EMR.  Records reviewed and summarized above.   ROS/pcg   General: NAD ENMT: denies dysphagia, blind Cardiovascular: denies DOE Pulmonary: denies cough, denies increased SOB Abdomen: endorses fair appetite, endorses constipation, endorses at times incontinence of bowel GU: denies dysuria, endorses at times incontinence of urine MSK:  denies weakness,  no falls reported Skin: endorses surgical site Neurological: endorses pain, denies insomnia Psych: Endorses anxious mood Heme/lymph/immuno: denies bruises, abnormal bleeding  Physical Exam: Current and past weights: 116 lbs Constitutional: NAD General: frail appearing, thin EYES: anicteric sclera, lids intact, no discharge  ENMT: hard of hearing, oral mucous membranes moist, dentition intact CV: no LE edema Pulmonary: no increased work of breathing, no cough, room air Abdomen: intake 50%,  no ascites GU: deferred MSK: ++ sarcopenia, moves all extremities, ambulatory with walker and PT working with pt Skin: warm and dry, no rashes or wounds on visible skin, h/o surgical site Neuro:  ++ generalized weakness,  ++cognitive impairment Psych: anxious affect, A and O x 1-2 Hem/lymph/immuno: no widespread bruising CURRENT PROBLEM LIST:  Patient Active  Problem List   Diagnosis Date Noted   Protein-calorie malnutrition, severe 05/18/2021   Hip fracture ([place]) 05/15/2021   Pre-op evaluation    Closed nondisplaced intertrochanteric fracture of right femur ([place])    Hyponatremia    Fall 06/30/2020   Hypertension    Coronary artery disease    COPD (chronic obstructive pulmonary disease) ([place])    Hypertensive urgency    Elevated troponin    PAF (paroxysmal atrial fibrillation) (HCC)    Chronic diastolic CHF (congestive heart failure) (HCC)    Current mild episode of major depressive disorder, unspecified whether recurrent ([place]) 04/25/2020   Advanced age 69/09[age]2019   Marilyn [place] syndrome 03/23/2020   Blindness 03/23/2020   Atrial fibrillation with RVR (HCC) 03/22/2020   Chronic pain syndrome 03/16/2020   Closed nondisplaced fracture of surgical neck of left humerus 03/16/2020   Chronic left-sided headaches 03/16/2020   Occipital neuralgia of left side 03/16/2020   Syncope 07/20/2019   Recurrent syncope 07/19/2019   Insomnia 03/18/2016   Visual hallucinations 03/18/2016   Vertigo 03/15/2016   Paroxysmal atrial fibrillation with RVR ([place]) 11/27/2015   Depression 11/27/2015   Adjustment disorder with disturbance of emotion 09/28/2015   Severe aortic stenosis 09/28/2015   Bilateral cataracts 09/28/2015   Cardiac  murmur 09/28/2015   Acute kidney injury superimposed on CKD ([place]) 09/28/2015   History of gout 09/28/2015   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/2015   OP (osteoporosis) 09/28/2015   Atherosclerosis of coronary artery 09/08/2015   Avitaminosis D 07/11/2015   LBP (low back pain) 07/11/2015   BP (high blood pressure) 07/11/2015   HLD (hyperlipidemia) 04/12/2015   Billowing mitral valve 08/03/2014   Fibrosis of lung ([place]) 05/25/2014   Asthma-chronic obstructive pulmonary disease overlap syndrome ([place]) 05/25/2014   H/O aortic valve replacement 11/09/2004   PAST MEDICAL HISTORY:  Active Ambulatory Problems    Diagnosis Date Noted    HLD (hyperlipidemia) 04/12/2015   Avitaminosis D 07/11/2015   LBP (low back pain) 07/11/2015   BP (high blood pressure) 07/11/2015   Atherosclerosis of coronary artery 09/08/2015   Adjustment disorder with disturbance of emotion 09/28/2015   Severe aortic stenosis 09/28/2015   Bilateral cataracts 09/28/2015   Cardiac murmur 09/28/2015   Acute kidney injury superimposed on CKD ([place]) 09/28/2015   History of gout 09/28/2015   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/2015   OP (osteoporosis) 09/28/2015   H/O aortic valve replacement 11/09/2004   Fibrosis of lung ([place]) 05/25/2014   Billowing mitral valve 08/03/2014   Asthma-chronic obstructive pulmonary disease overlap syndrome ([place]) 05/25/2014   Paroxysmal atrial fibrillation with RVR ([place]) 11/27/2015   Depression 11/27/2015   Vertigo 03/15/2016   Insomnia 03/18/2016   Visual hallucinations 03/18/2016   Recurrent syncope 07/19/2019   Syncope 07/20/2019   Chronic pain syndrome 03/16/2020   Closed nondisplaced fracture of surgical neck of left humerus 03/16/2020   Chronic left-sided headaches 03/16/2020   Occipital neuralgia of left side 03/16/2020   Atrial fibrillation with RVR ([place]) 03/22/2020   Advanced age 80/09/2019   Marilyn [place] syndrome 03/23/2020   Blindness 03/23/2020   Current mild episode of major depressive disorder, unspecified whether recurrent ([place]) 04/25/2020   Fall 06/30/2020   Hypertension    Coronary artery disease    COPD (chronic obstructive pulmonary disease) (HCC)    Hypertensive urgency    Elevated troponin    PAF (paroxysmal atrial fibrillation) (HCC)    Chronic diastolic CHF (congestive heart failure) ([place])    Hip fracture ([place]) 05/15/2021   Pre-op evaluation    Closed nondisplaced intertrochanteric fracture of right femur ([place])    Hyponatremia    Protein-calorie malnutrition, severe 05/18/2021   Resolved Ambulatory Problems    Diagnosis Date Noted   CAFL (chronic airflow limitation) ([place]) 09/08/2015    Bronchitis with chronic airway obstruction ([place]) 09/28/2015   Subclinical hypothyroidism 09/28/2015   Arteriosclerosis of coronary artery 08/03/2014   Humerus fracture 11/21/2015   Chest pain 11/27/2015   Dyspnea 11/27/2015   Elevated troponin 11/27/2015   Constipation 11/27/2015   Fracture of left humerus 11/27/2015   Skin laceration 01/26/2016   Past Medical History:  Diagnosis Date   Arthritis    CHF (congestive heart failure) ([place])    Hypercholesteremia    SOCIAL HX:  Social History   Tobacco Use   Smoking status: Never   Smokeless tobacco: Never  Substance Use Topics   Alcohol use: No   FAMILY HX:  Family History  Problem Relation Age of Onset   AAA (abdominal aortic aneurysm) Father    Lung cancer Brother    Prostate cancer Brother    Heart failure Brother      ALLERGIES:  Allergies  Allergen Reactions   Iodine Anaphylaxis   Shellfish Allergy Anaphylaxis   Azithromycin Hives   Sulfa Antibiotics  Hives     PERTINENT MEDICATIONS:  Outpatient Encounter Medications as of 05/25/2021  Medication Sig   acetaminophen (TYLENOL) 500 MG tablet Take 1,000 mg by mouth every 8 (eight) hours.   albuterol (PROVENTIL) (2.5 MG/3ML) 0.083% nebulizer solution Take 2.5 mg by nebulization every 8 (eight) hours as needed for wheezing or shortness of breath.    alum & mag hydroxide-simeth (MAALOX/MYLANTA) 200-200-20 MG/5ML suspension Take 30 mLs by mouth every 4 (four) hours as needed for indigestion.   budesonide (PULMICORT) 0.5 MG/2ML nebulizer solution Take 0.5 mg by nebulization at bedtime.   diclofenac Sodium (VOLTAREN) 1 % GEL Apply 2 g topically every 12 (twelve) hours as needed (left occipital lobe pain).   docusate sodium (COLACE) 100 MG capsule Take 1 capsule (100 mg total) by mouth 2 (two) times daily.   enoxaparin (LOVENOX) 40 MG/0.4ML injection Inject 0.4 mLs (40 mg total) into the skin daily for 14 days.   escitalopram (LEXAPRO) 20 MG tablet Take 1 tablet (20 mg total) by  mouth daily.   feeding supplement (ENSURE ENLIVE / ENSURE PLUS) LIQD Take 237 mLs by mouth 3 (three) times daily between meals.   ferrous sulfate 325 (65 FE) MG tablet Take 1 tablet (325 mg total) by mouth 2 (two) times daily with a meal.   fexofenadine (ALLEGRA) 180 MG tablet Take 180 mg by mouth daily.   Fluticasone-Salmeterol (ADVAIR) 250-50 MCG/DOSE AEPB INHALE ONE PUFF INTO LUNGS EVERY 12 HOURS. RINSE MOUTH AFTER USE   furosemide (LASIX) 20 MG tablet Take 1 tablet (20 mg total) by mouth daily.   hydrALAZINE (APRESOLINE) 25 MG tablet Take 1 tablet (25 mg total) by mouth 2 (two) times daily.   ipratropium-albuterol (DUONEB) 0.5-2.5 (3) MG/3ML SOLN Take 3 mLs by nebulization 3 (three) times daily.   losartan (COZAAR) 25 MG tablet Take 0.5 tablets (12.5 mg total) by mouth daily.   metoprolol succinate (TOPROL-XL) 25 MG 24 hr tablet Take 1 tablet (25 mg total) by mouth daily.   oxyCODONE (OXY IR/ROXICODONE) 5 MG immediate release tablet Take 1 tablet (5 mg total) by mouth every 4 (four) hours as needed for moderate pain.   polyethylene glycol (MIRALAX / GLYCOLAX) 17 g packet Take 17 g by mouth daily.   No facility-administered encounter medications on file as of 05/25/2021.     Thank you for the opportunity to participate in the care of Marilyn [place].  The palliative care team will continue to follow. Please call our office at [phone] if we can be of additional assistance.   Jason Coop, NP ,  DNP [facility]  COVID-19 PATIENT SCREENING TOOL Asked and negative response unless otherwise noted:  Have you had symptoms of covid, tested positive or been in contact with someone with symptoms/positive test in the past 5-10 days?

## 2021-05-29 ENCOUNTER — Other Ambulatory Visit: Payer: Self-pay

## 2021-05-29 ENCOUNTER — Non-Acute Institutional Stay: Payer: Medicare Other | Admitting: Primary Care

## 2021-05-29 DIAGNOSIS — Z515 Encounter for palliative care: Secondary | ICD-10-CM

## 2021-05-29 DIAGNOSIS — E43 Unspecified severe protein-calorie malnutrition: Secondary | ICD-10-CM

## 2021-05-29 DIAGNOSIS — J449 Chronic obstructive pulmonary disease, unspecified: Secondary | ICD-10-CM

## 2021-05-29 NOTE — Progress Notes (Signed)
[facility] Palliative Care Consult Note Telephone: [phone]  Fax: [phone]    Date of encounter: 05/29/21 8:17 PM PATIENT NAME: Mar[place]n [place] [id number]   [phone] (home)  DOB: [birth year] MRN: [id number] PRIMARY CARE PROVIDER:    Tania Ade,  [address] [place] [address] [phone]  REFERRING PROVIDER:   Alvester Morin, MD [address] [place],  [place] [address] [phone]   RESPONSIBLE PARTY:    Contact Information     Name Relation Home Work Mobile   Burdette Niece [phone]          I met face to face with patient  in [facility] facility. Palliative Care was asked to follow this patient by consultation request of  Slade-Hartman, Ivette Loyal*  to address advance care planning and complex medical decision making. This is a follow up visit.                                   ASSESSMENT AND PLAN / RECOMMENDATIONS:   Advance Care Planning/Goals of Care: Goals include to maximize quality of life and symptom management. Our advance care planning conversation included a discussion about:    The value and importance of advance care planning  Experiences with loved ones who have been seriously ill or have died  Exploration of personal, cultural or spiritual beliefs that might influence medical decisions  Exploration of goals of care in the event of a sudden injury or illness  Identification of a healthcare agent  Review of an  advance directive document  CODE STATUS: DNR I received a call from patient's power of attorney regarding decline over weekend. Patient has developed possible pneumonia, and has rhonchi and malaise. Skilled facility provider  worked her up for pneumonia with abx, prednisone, fluids and  Kayexalate for elevated potassium . We talked about treating her in place as per her MOST form. MOST form is present and I have completed it today on  my visit. It does discuss limited scope but on our discussion, power of attorney stated she would like to have as much treated in place to decrease possibility of delirium and exhaustion at being sent out. I think this is a reasonable course.   We discussed the fact that she has rallied many times previously and that she is aware of her surroundings. She was to have moved in in a long-term care bed prior to the hospital stay.  Power of attorney voices that patient is upset that she is not able to stay with her 24/ seven. However family members or paid caregivers are there nearly constantly. We discussed end-of-life management should these modalities not improve her status. We discussed her receiving formal hospice services as well as palliative managed  symptom management.   I spent 35 minutes providing this consultation. More than 50% of the time in this consultation was spent in counseling and care coordination. ------------------------------------------------------------------------------------------------  Symptom Management/Plan:  Mobility Patient was alert and somewhat interactive. She answered a few Yes/no questions. She appeared relaxed and comfortable. Patient is still receiving some therapy services per power of attorney. She would like to maximize her potential to rehab even in the face of this setback.  Intake: She is receiving thickened liquids and I requested for her to have water at the bedside, thickened.  She is receiving IV fluids currently and staff is helping her  with PO intake.   Staff reports intake of < 25%, sips and bites.  Constipation she is now on Kayexalate for increased potassium so this may help her ongoing constipation problem. K was 5.7 approx. She has had increased constipation of late due to immobility poor PO intake and narcotics. Continue to monitor for bm with goal 3 x/ week. She's been having them weekly.  Follow up Palliative Care Visit: Palliative care will  continue to follow for complex medical decision making, advance care planning, and clarification of goals. Return 1-2 weeks or prn.  This visit was coded based on medical decision making (MDM).  PPS: 30%  HOSPICE ELIGIBILITY/DIAGNOSIS: yes with concordant goals of care/wt loss  Chief Complaint: dyspnea  HISTORY OF PRESENT ILLNESS:  Marilyn [place] is a 87 y.o. year old female  with dyspnea, wt loss, COPD, CHF. She has exhibited sob and rhonchi and malaise, and is being Rx for PNA at [facility]. Family goals of care were discussed in context of complex medical decision making.  History obtained from review of EMR, discussion with primary team, and interview with family, facility staff/caregiver and/or [place]n [place].  I reviewed available labs, medications, imaging, studies and related documents from the EMR.  Records reviewed and summarized above.   ROS  General: NAD ENMT: endorses dysphagia Cardiovascular: denies chest pain, endorses DOE Pulmonary: endorses cough, endorses increased SOB Abdomen: endorses poor  appetite, endorsesconstipation, denies incontinence of bowel GU: denies dysuria, endorses incontinence of urine MSK:  endorsesweakness,  no recent falls reported Skin: denies rashes or wounds Neurological: endorses occ  pain, denies insomnia Psych: Endorses withdrawn mood Heme/lymph/immuno: denies bruises, abnormal bleeding  Physical Exam: Current and past weights: declining Constitutional: NAD, HR 100 RR 22 General: frail appearing, thin EYES: anicteric sclera, lids intact, no discharge , blind ENMT: hard of hearing, oral mucous membranes dry, dentition intact CV: S1S2, irreg rate,  no LE edema Pulmonary: rhonchi in all fields, no increased work of breathing, + cough,  oxygen per [place] Abdomen: intake < 25%,  no ascites MSK: severe  sarcopenia, moves all extremities, non ambulatory Skin: warm and dry, no rashes or wounds on visible skin Neuro:  severe generalized weakness,  severe  cognitive impairment Psych: non-anxious affect, A and O x 1 Hem/lymph/immuno: no widespread bruising  Thank you for the opportunity to participate in the care of Marilyn [place].  The palliative care team will continue to follow. Please call our office at [phone] if we can be of additional assistance.   Jason Coop, NP   COVID-19 PATIENT SCREENING TOOL Asked and negative response unless otherwise noted:   Have you had symptoms of covid, tested positive or been in contact with someone with symptoms/positive test in the past 5-10 days?

## 2021-06-23 DEATH — deceased
